# Patient Record
Sex: Female | Born: 1995 | Race: Black or African American | Hispanic: No | Marital: Single | Smoking: Current every day smoker
Health system: Southern US, Community
[De-identification: ages and names within clinical notes are randomized; demographics above are authoritative.]

## PROBLEM LIST (undated history)

## (undated) ENCOUNTER — Inpatient Hospital Stay (HOSPITAL_COMMUNITY): Payer: Self-pay

## (undated) ENCOUNTER — Ambulatory Visit (HOSPITAL_COMMUNITY): Admission: EM | Discharge status: Absent without leave | Payer: Medicaid Other

## (undated) DIAGNOSIS — O99345 Other mental disorders complicating the puerperium: Secondary | ICD-10-CM

## (undated) DIAGNOSIS — B999 Unspecified infectious disease: Secondary | ICD-10-CM

## (undated) DIAGNOSIS — D649 Anemia, unspecified: Secondary | ICD-10-CM

## (undated) DIAGNOSIS — J45909 Unspecified asthma, uncomplicated: Secondary | ICD-10-CM

## (undated) DIAGNOSIS — F329 Major depressive disorder, single episode, unspecified: Secondary | ICD-10-CM

## (undated) DIAGNOSIS — M549 Dorsalgia, unspecified: Secondary | ICD-10-CM

## (undated) DIAGNOSIS — K219 Gastro-esophageal reflux disease without esophagitis: Secondary | ICD-10-CM

## (undated) DIAGNOSIS — F32A Depression, unspecified: Secondary | ICD-10-CM

## (undated) DIAGNOSIS — F419 Anxiety disorder, unspecified: Secondary | ICD-10-CM

## (undated) DIAGNOSIS — F319 Bipolar disorder, unspecified: Secondary | ICD-10-CM

## (undated) DIAGNOSIS — G8929 Other chronic pain: Secondary | ICD-10-CM

## (undated) DIAGNOSIS — N12 Tubulo-interstitial nephritis, not specified as acute or chronic: Secondary | ICD-10-CM

## (undated) DIAGNOSIS — R519 Headache, unspecified: Secondary | ICD-10-CM

## (undated) DIAGNOSIS — F53 Postpartum depression: Secondary | ICD-10-CM

## (undated) DIAGNOSIS — Z5189 Encounter for other specified aftercare: Secondary | ICD-10-CM

## (undated) DIAGNOSIS — N390 Urinary tract infection, site not specified: Secondary | ICD-10-CM

## (undated) HISTORY — PX: WISDOM TOOTH EXTRACTION: SHX21

## (undated) HISTORY — PX: DILATION AND CURETTAGE OF UTERUS: SHX78

## (undated) HISTORY — DX: Headache, unspecified: R51.9

## (undated) HISTORY — PX: INDUCED ABORTION: SHX677

---
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
## 2013-01-02 ENCOUNTER — Emergency Department (INDEPENDENT_AMBULATORY_CARE_PROVIDER_SITE_OTHER)
Admission: EM | Admit: 2013-01-02 | Discharge: 2013-01-02 | Disposition: A | Discharge status: Home / Self Care | Payer: Self-pay | Source: Home / Self Care | Attending: Family Medicine | Admitting: Family Medicine

## 2013-01-02 ENCOUNTER — Encounter (HOSPITAL_COMMUNITY): Payer: Self-pay | Admitting: Emergency Medicine

## 2013-01-02 ENCOUNTER — Other Ambulatory Visit (HOSPITAL_COMMUNITY)
Admission: RE | Admit: 2013-01-02 | Discharge: 2013-01-02 | Disposition: A | Discharge status: Home / Self Care | Payer: Self-pay | Source: Ambulatory Visit | Attending: Family Medicine | Admitting: Family Medicine

## 2013-01-02 DIAGNOSIS — N390 Urinary tract infection, site not specified: Secondary | ICD-10-CM

## 2013-01-02 DIAGNOSIS — N760 Acute vaginitis: Secondary | ICD-10-CM

## 2013-01-02 DIAGNOSIS — Z202 Contact with and (suspected) exposure to infections with a predominantly sexual mode of transmission: Secondary | ICD-10-CM

## 2013-01-02 DIAGNOSIS — Z113 Encounter for screening for infections with a predominantly sexual mode of transmission: Secondary | ICD-10-CM | POA: Insufficient documentation

## 2013-01-02 DIAGNOSIS — N72 Inflammatory disease of cervix uteri: Secondary | ICD-10-CM

## 2013-01-02 LAB — POCT URINALYSIS DIP (DEVICE)
Bilirubin Urine: NEGATIVE
Glucose, UA: NEGATIVE mg/dL
Hgb urine dipstick: NEGATIVE
Ketones, ur: NEGATIVE mg/dL
Leukocytes, UA: NEGATIVE
Nitrite: NEGATIVE
Protein, ur: 30 mg/dL — AB
Specific Gravity, Urine: 1.02 (ref 1.005–1.030)
Urobilinogen, UA: 1 mg/dL (ref 0.0–1.0)
pH: 8.5 — ABNORMAL HIGH (ref 5.0–8.0)

## 2013-01-02 LAB — POCT PREGNANCY, URINE: Preg Test, Ur: NEGATIVE

## 2013-01-02 MED ORDER — LIDOCAINE HCL (PF) 1 % IJ SOLN
INTRAMUSCULAR | Status: AC
  Filled 2013-01-02: qty 5

## 2013-01-02 MED ORDER — AZITHROMYCIN 250 MG PO TABS
ORAL_TABLET | ORAL | Status: AC
  Filled 2013-01-02: qty 4

## 2013-01-02 MED ORDER — CEFTRIAXONE SODIUM 250 MG IJ SOLR
250.0000 mg | Freq: Once | INTRAMUSCULAR | Status: AC
  Administered 2013-01-02: 250 mg via INTRAMUSCULAR

## 2013-01-02 MED ORDER — METRONIDAZOLE 500 MG PO TABS: 500.0000 mg | ORAL_TABLET | Freq: Two times a day (BID) | ORAL | Status: DC

## 2013-01-02 MED ORDER — NORGESTIM-ETH ESTRAD TRIPHASIC 0.18/0.215/0.25 MG-25 MCG PO TABS: 1.0000 | ORAL_TABLET | Freq: Every day | ORAL | Status: DC

## 2013-01-02 MED ORDER — AZITHROMYCIN 250 MG PO TABS
1000.0000 mg | ORAL_TABLET | Freq: Once | ORAL | Status: AC
  Administered 2013-01-02: 1000 mg via ORAL

## 2013-01-02 MED ORDER — CEFTRIAXONE SODIUM 250 MG IJ SOLR
INTRAMUSCULAR | Status: AC
  Filled 2013-01-02: qty 250

## 2013-01-02 NOTE — ED Notes (Signed)
Pt c/o vaginal discharge x 2 weeks. Pt reports she had an abortion in march and before then had bacteria vaginosis. Took all of her prescribed medicine but still feels like she has the same symptoms. Has a yellowish white discharge and foul smell. No problems with urinating. Mild pelvic pain, but denies abdominal pain. No fever. Patient is alert and oriented.

## 2013-01-02 NOTE — ED Provider Notes (Signed)
History     CSN: [id number]  Arrival date & time 01/02/13  1033   First MD Initiated Contact with Patient 01/02/13 1134      Chief Complaint  Patient presents with  . Vaginal Discharge    (Consider location/radiation/quality/duration/timing/severity/associated sxs/prior treatment) HPI Comments: 17 year old female here complaining of vaginal discharge. Status she wants to be tested for STDs and will be treated empirically for STDs as she may have been exposed recently. She reports unprotected sex. She states that she had a D&C in March and has been having a yellow thick and foul smell vaginal discharge since. States she is coming out of her period currently and is sexually active and is not using condoms or using any other birth control method. Denies pelvic pain. No fever or chills. No dysuria or hematuria. She has had a history of Chlamydia in the past. Denies history of smoking. No history of DVT or clot disorders.   Past Medical History  Diagnosis Date  . Infection     UTI    Past Surgical History  Procedure Laterality Date  . No past surgeries    . Induced abortion      No family history on file.  History  Substance Use Topics  . Smoking status: Never Smoker   . Smokeless tobacco: Not on file  . Alcohol Use: No    OB History   Grav Para Term Preterm Abortions TAB SAB Ect Mult Living   2 1 1       1       Review of Systems  Constitutional: Negative for fever and chills.  Gastrointestinal: Negative for nausea, vomiting and abdominal pain.  Genitourinary: Positive for vaginal discharge. Negative for vaginal bleeding and pelvic pain.  Skin: Negative for rash.  Neurological: Negative for dizziness and headaches.  All other systems reviewed and are negative.    Allergies  Review of patient's allergies indicates no known allergies.  Home Medications   Current Outpatient Rx  Name  Route  Sig  Dispense  Refill  . metroNIDAZOLE (FLAGYL) 500 MG tablet   Oral  Take 1 tablet (500 mg total) by mouth 2 (two) times daily.   14 tablet   0   . Norgestimate-Ethinyl Estradiol Triphasic 0.18/0.215/0.25 MG-25 MCG tab   Oral   Take 1 tablet by mouth daily.   1 Package   2     LMP 12/25/2012  Breastfeeding? Unknown  Physical Exam  Nursing note and vitals reviewed. Constitutional: She is oriented to person, place, and time. She appears well-developed and well-nourished. No distress.  Eyes: No scleral icterus.  Cardiovascular: Normal heart sounds.   Pulmonary/Chest: Breath sounds normal.  Abdominal: Soft. She exhibits no distension and no mass. There is no tenderness. There is no rebound and no guarding.  Genitourinary: Uterus normal. There is no rash on the right labia. There is no rash on the left labia. Cervix exhibits discharge. Cervix exhibits no motion tenderness and no friability. Vaginal discharge found.  Lymphadenopathy:    She has no cervical adenopathy.       Right: No inguinal adenopathy present.       Left: No inguinal adenopathy present.  Neurological: She is alert and oriented to person, place, and time.  Skin: No rash noted. She is not diaphoretic.    ED Course  Procedures (including critical care time)  Labs Reviewed  POCT URINALYSIS DIP (DEVICE) - Abnormal; Notable for the following:    pH 8.5 (*)  Protein, ur 30 (*)    All other components within normal limits  POCT PREGNANCY, URINE  CERVICOVAGINAL ANCILLARY ONLY   No results found.   1. Possible exposure to STD   2. Vaginitis       MDM  Treated empirically with Rocephin 250 mg IM x1 and a azithromycin 1 g oral x1. Prescribed metronidazole and also provided a prescription for low estrogen oral contraceptive pills.  Sharin Grave, MD 01/05/13 1035

## 2013-01-05 NOTE — ED Notes (Signed)
GC/Chlamydia neg., Affirm: Candida and Trich neg., Gardnerella pos. Pt. adequately treated with Flagyl. Vassie Moselle 01/05/2013

## 2013-05-11 ENCOUNTER — Emergency Department (INDEPENDENT_AMBULATORY_CARE_PROVIDER_SITE_OTHER)
Admission: EM | Admit: 2013-05-11 | Discharge: 2013-05-11 | Disposition: A | Discharge status: Home / Self Care | Payer: Self-pay | Source: Home / Self Care | Attending: Family Medicine | Admitting: Family Medicine

## 2013-05-11 ENCOUNTER — Other Ambulatory Visit (HOSPITAL_COMMUNITY)
Admission: RE | Admit: 2013-05-11 | Discharge: 2013-05-11 | Disposition: A | Discharge status: Home / Self Care | Payer: Medicaid Other | Source: Ambulatory Visit | Attending: Family Medicine | Admitting: Family Medicine

## 2013-05-11 ENCOUNTER — Encounter (HOSPITAL_COMMUNITY): Payer: Self-pay | Admitting: Emergency Medicine

## 2013-05-11 DIAGNOSIS — N760 Acute vaginitis: Secondary | ICD-10-CM | POA: Insufficient documentation

## 2013-05-11 DIAGNOSIS — Z113 Encounter for screening for infections with a predominantly sexual mode of transmission: Secondary | ICD-10-CM | POA: Insufficient documentation

## 2013-05-11 DIAGNOSIS — N921 Excessive and frequent menstruation with irregular cycle: Secondary | ICD-10-CM

## 2013-05-11 LAB — POCT PREGNANCY, URINE: Preg Test, Ur: NEGATIVE

## 2013-05-11 LAB — POCT URINALYSIS DIP (DEVICE)
Glucose, UA: 100 mg/dL — AB
Ketones, ur: NEGATIVE mg/dL
Leukocytes, UA: NEGATIVE
Nitrite: NEGATIVE
Protein, ur: 100 mg/dL — AB
Specific Gravity, Urine: >=1.03 (ref 1.005–1.030)
Urobilinogen, UA: 1 mg/dL (ref 0.0–1.0)
pH: 6.5 (ref 5.0–8.0)

## 2013-05-11 NOTE — ED Provider Notes (Signed)
Melanie Hancock is a 17 y.o. female who presents to Urgent Care today for abdominal pain and vaginal bleeding. She noted abdominal pressure consistent with UTI last week. She took left over antibiotics for about one week and had resolution of her symptoms. She no longer has any dysuria or urinary urgency or frequency. She however note some mild pelvic discomfort associated with vaginal bleeding started recently. It was initially spotting and now is more heavy bleeding. Her last period was to have weeks ago. She typically does not have midcycle bleeding. She feels well otherwise with no nausea vomiting or diarrhea. She has not tried any medications for this. She does not take birth control.   Past Medical History  Diagnosis Date  . Infection     UTI   History  Substance Use Topics  . Smoking status: Never Smoker   . Smokeless tobacco: Not on file  . Alcohol Use: No   ROS as above Medications reviewed. No current facility-administered medications for this encounter.   No current outpatient prescriptions on file.    Exam:  BP 110/72  Pulse 68  Temp(Src) 98 F (36.7 C) (Oral)  Resp 16  SpO2 100%  LMP 05/04/2013 Gen: Well NAD HEENT: EOMI,  MMM Lungs: CTABL Nl WOB Heart: RRR no MRG Abd: NABS, NT, ND Exts: Non edematous BL  LE, warm and well perfused.  GYN: Normal external genitalia. Vaginal canal with menstrual blood. Cervix difficult to see due to menstrual bleeding. Normal otherwise.  Results for orders placed during the hospital encounter of 05/11/13 (from the past 24 hour(s))  POCT URINALYSIS DIP (DEVICE)     Status: Abnormal   Collection Time    05/11/13  1:04 PM      Result Value Range   Glucose, UA 100 (*) NEGATIVE mg/dL   Bilirubin Urine SMALL (*) NEGATIVE   Ketones, ur NEGATIVE  NEGATIVE mg/dL   Specific Gravity, Urine >=1.030  1.005 - 1.030   Hgb urine dipstick LARGE (*) NEGATIVE   pH 6.5  5.0 - 8.0   Protein, ur 100 (*) NEGATIVE mg/dL   Urobilinogen, UA 1.0  0.0  - 1.0 mg/dL   Nitrite NEGATIVE  NEGATIVE   Leukocytes, UA NEGATIVE  NEGATIVE  POCT PREGNANCY, URINE     Status: None   Collection Time    05/11/13  1:15 PM      Result Value Range   Preg Test, Ur NEGATIVE  NEGATIVE   No results found.  Assessment and Plan: 17 y.o. female with vaginal bleeding.  Abdominal pain was likely due to the vaginal bleeding. Urine culture, vaginal secretions cytology for gonorrhea Chlamydia Trichomonas BV and yeast are pending Followup with OB/GYN if this persists Discussed warning signs or symptoms. Please see discharge instructions. Patient expresses understanding.      Rodolph Bong, MD 05/11/13 1324

## 2013-05-11 NOTE — ED Notes (Signed)
Patient can't void at this time 

## 2013-05-11 NOTE — ED Notes (Signed)
C/o abd pain and bleeding

## 2013-05-13 ENCOUNTER — Telehealth (HOSPITAL_COMMUNITY): Payer: Self-pay | Admitting: Family Medicine

## 2013-05-13 MED ORDER — METRONIDAZOLE 500 MG PO TABS: 500.0000 mg | ORAL_TABLET | Freq: Two times a day (BID) | ORAL | Status: DC

## 2013-05-13 NOTE — ED Notes (Signed)
Melanie Hancock will call patient.  Flagyl rx sent in.    Rodolph Bong, MD 05/13/13 [phone]

## 2013-05-13 NOTE — ED Notes (Signed)
Lab review

## 2013-05-17 ENCOUNTER — Telehealth (HOSPITAL_COMMUNITY): Payer: Self-pay | Admitting: *Deleted

## 2013-05-17 NOTE — ED Notes (Signed)
Gardnerella pos. Rest of labs neg.  Pt. adequately treated with Flagyl. Vassie Moselle 05/17/2013

## 2013-05-17 NOTE — ED Notes (Signed)
GC/Chlamydia neg., Affirm: Candida and Trich neg., Gardnerella pos.  Dr. Denyse Amass e-prescribed Flagyl to the [facility]. I called pt. Pt. verified x 2 and given results. Pt. told she where to pick up her Rx. Pt. asked how much it costs. I told her I did not know, she would have to call the pharmacy. Pt. voiced understanding. Vassie Moselle 05/17/2013

## 2013-05-26 ENCOUNTER — Emergency Department (HOSPITAL_COMMUNITY): Payer: Medicaid Other

## 2013-05-26 ENCOUNTER — Observation Stay (HOSPITAL_COMMUNITY)
Admission: EM | Admit: 2013-05-26 | Discharge: 2013-05-27 | Disposition: A | Discharge status: Home / Self Care | Payer: Medicaid Other | Attending: Internal Medicine | Admitting: Internal Medicine

## 2013-05-26 ENCOUNTER — Encounter (HOSPITAL_COMMUNITY): Payer: Self-pay | Admitting: *Deleted

## 2013-05-26 ENCOUNTER — Emergency Department (HOSPITAL_COMMUNITY)
Admission: EM | Admit: 2013-05-26 | Discharge: 2013-05-26 | Disposition: A | Discharge status: Home / Self Care | Payer: Medicaid Other | Attending: Emergency Medicine | Admitting: Emergency Medicine

## 2013-05-26 DIAGNOSIS — D649 Anemia, unspecified: Secondary | ICD-10-CM

## 2013-05-26 DIAGNOSIS — Z792 Long term (current) use of antibiotics: Secondary | ICD-10-CM | POA: Insufficient documentation

## 2013-05-26 DIAGNOSIS — D509 Iron deficiency anemia, unspecified: Principal | ICD-10-CM | POA: Insufficient documentation

## 2013-05-26 DIAGNOSIS — N390 Urinary tract infection, site not specified: Secondary | ICD-10-CM

## 2013-05-26 DIAGNOSIS — Z3202 Encounter for pregnancy test, result negative: Secondary | ICD-10-CM | POA: Insufficient documentation

## 2013-05-26 DIAGNOSIS — N10 Acute tubulo-interstitial nephritis: Secondary | ICD-10-CM

## 2013-05-26 DIAGNOSIS — N924 Excessive bleeding in the premenopausal period: Secondary | ICD-10-CM | POA: Insufficient documentation

## 2013-05-26 DIAGNOSIS — I498 Other specified cardiac arrhythmias: Secondary | ICD-10-CM | POA: Insufficient documentation

## 2013-05-26 HISTORY — DX: Urinary tract infection, site not specified: N39.0

## 2013-05-26 LAB — URINALYSIS, ROUTINE W REFLEX MICROSCOPIC
Glucose, UA: NEGATIVE mg/dL
Ketones, ur: 15 mg/dL — AB
Nitrite: NEGATIVE
Protein, ur: >300 mg/dL — AB
Specific Gravity, Urine: 1.036 — ABNORMAL HIGH (ref 1.005–1.030)
Urobilinogen, UA: 1 mg/dL (ref 0.0–1.0)
pH: 6 (ref 5.0–8.0)

## 2013-05-26 LAB — COMPREHENSIVE METABOLIC PANEL
ALT: 12 U/L (ref 0–35)
AST: 24 U/L (ref 0–37)
Albumin: 3.2 g/dL — ABNORMAL LOW (ref 3.5–5.2)
Alkaline Phosphatase: 55 U/L (ref 39–117)
BUN: 11 mg/dL (ref 6–23)
CO2: 24 mEq/L (ref 19–32)
Calcium: 8.2 mg/dL — ABNORMAL LOW (ref 8.4–10.5)
Chloride: 107 mEq/L (ref 96–112)
Creatinine, Ser: 0.92 mg/dL (ref 0.50–1.10)
GFR calc Af Amer: >90 mL/min (ref >90)
GFR calc non Af Amer: 90 mL/min — ABNORMAL LOW (ref >90)
Glucose, Bld: 99 mg/dL (ref 70–99)
Potassium: 3.4 mEq/L — ABNORMAL LOW (ref 3.5–5.1)
Sodium: 139 mEq/L (ref 135–145)
Total Bilirubin: 0.3 mg/dL (ref 0.3–1.2)
Total Protein: 6.3 g/dL (ref 6.0–8.3)

## 2013-05-26 LAB — CBC
HCT: 22.2 % — ABNORMAL LOW (ref 36.0–46.0)
HCT: 30 % — ABNORMAL LOW (ref 36.0–46.0)
Hemoglobin: 6.6 g/dL — CL (ref 12.0–15.0)
Hemoglobin: 9.3 g/dL — ABNORMAL LOW (ref 12.0–15.0)
MCH: 19.4 pg — ABNORMAL LOW (ref 26.0–34.0)
MCH: 21.6 pg — ABNORMAL LOW (ref 26.0–34.0)
MCHC: 29.7 g/dL — ABNORMAL LOW (ref 30.0–36.0)
MCHC: 31 g/dL (ref 30.0–36.0)
MCV: 65.3 fL — ABNORMAL LOW (ref 78.0–100.0)
MCV: 69.8 fL — ABNORMAL LOW (ref 78.0–100.0)
Platelets: 301 10*3/uL (ref 150–400)
Platelets: 279 10*3/uL (ref 150–400)
RBC: 3.4 MIL/uL — ABNORMAL LOW (ref 3.87–5.11)
RBC: 4.3 MIL/uL (ref 3.87–5.11)
RDW: 17.2 % — ABNORMAL HIGH (ref 11.5–15.5)
RDW: 20 % — ABNORMAL HIGH (ref 11.5–15.5)
WBC: 5.6 10*3/uL (ref 4.0–10.5)
WBC: 6.2 10*3/uL (ref 4.0–10.5)

## 2013-05-26 LAB — MAGNESIUM: Magnesium: 1.8 mg/dL (ref 1.5–2.5)

## 2013-05-26 LAB — POCT PREGNANCY, URINE: Preg Test, Ur: NEGATIVE

## 2013-05-26 LAB — CBC WITH DIFFERENTIAL/PLATELET
Basophils Absolute: 0 10*3/uL (ref 0.0–0.1)
Basophils Relative: 1 % (ref 0–1)
Eosinophils Absolute: 0.3 10*3/uL (ref 0.0–0.7)
Eosinophils Relative: 6 % — ABNORMAL HIGH (ref 0–5)
HCT: 21.9 % — ABNORMAL LOW (ref 36.0–46.0)
Hemoglobin: 6.5 g/dL — CL (ref 12.0–15.0)
Lymphocytes Relative: 38 % (ref 12–46)
Lymphs Abs: 2 10*3/uL (ref 0.7–4.0)
MCH: 19.3 pg — ABNORMAL LOW (ref 26.0–34.0)
MCHC: 29.7 g/dL — ABNORMAL LOW (ref 30.0–36.0)
MCV: 65.2 fL — ABNORMAL LOW (ref 78.0–100.0)
Monocytes Absolute: 0.4 10*3/uL (ref 0.1–1.0)
Monocytes Relative: 8 % (ref 3–12)
Neutro Abs: 2.6 10*3/uL (ref 1.7–7.7)
Neutrophils Relative %: 48 % (ref 43–77)
Platelets: 282 10*3/uL (ref 150–400)
RBC: 3.36 MIL/uL — ABNORMAL LOW (ref 3.87–5.11)
RDW: 17.2 % — ABNORMAL HIGH (ref 11.5–15.5)
WBC: 5.4 10*3/uL (ref 4.0–10.5)

## 2013-05-26 LAB — URINE MICROSCOPIC-ADD ON

## 2013-05-26 LAB — PROTIME-INR
INR: 1.09 (ref 0.00–1.49)
Prothrombin Time: 13.9 seconds (ref 11.6–15.2)

## 2013-05-26 LAB — IRON AND TIBC
Iron: 19 ug/dL — ABNORMAL LOW (ref 42–135)
Saturation Ratios: 5 % — ABNORMAL LOW (ref 20–55)
TIBC: 386 ug/dL (ref 250–470)
UIBC: 367 ug/dL (ref 125–400)

## 2013-05-26 LAB — LIPASE, BLOOD: Lipase: 19 U/L (ref 11–59)

## 2013-05-26 LAB — VITAMIN B12: Vitamin B-12: 540 pg/mL (ref 211–911)

## 2013-05-26 LAB — FERRITIN: Ferritin: 3 ng/mL — ABNORMAL LOW (ref 10–291)

## 2013-05-26 LAB — RPR: RPR Ser Ql: NONREACTIVE

## 2013-05-26 LAB — ABO/RH: ABO/RH(D): A POS

## 2013-05-26 LAB — RETICULOCYTES
RBC.: 3.71 MIL/uL — ABNORMAL LOW (ref 3.87–5.11)
Retic Count, Absolute: 37.1 10*3/uL (ref 19.0–186.0)
Retic Ct Pct: 1 % (ref 0.4–3.1)

## 2013-05-26 LAB — FOLATE: Folate: 9.7 ng/mL

## 2013-05-26 LAB — PREPARE RBC (CROSSMATCH)

## 2013-05-26 LAB — OCCULT BLOOD, POC DEVICE: Fecal Occult Bld: NEGATIVE

## 2013-05-26 LAB — HIV ANTIBODY (ROUTINE TESTING W REFLEX): HIV: NONREACTIVE

## 2013-05-26 LAB — TROPONIN I: Troponin I: <0.3 ng/mL (ref <0.30)

## 2013-05-26 MED ORDER — CEPHALEXIN 500 MG PO CAPS: 500.0000 mg | ORAL_CAPSULE | Freq: Once | ORAL | Status: DC

## 2013-05-26 MED ORDER — CEPHALEXIN 250 MG PO CAPS
500.0000 mg | ORAL_CAPSULE | Freq: Once | ORAL | Status: AC
  Administered 2013-05-26: 500 mg via ORAL
  Filled 2013-05-26: qty 2

## 2013-05-26 MED ORDER — FERROUS SULFATE 325 (65 FE) MG PO TABS
325.0000 mg | ORAL_TABLET | Freq: Three times a day (TID) | ORAL | Status: DC
  Administered 2013-05-26 – 2013-05-27 (×3): 325 mg via ORAL
  Filled 2013-05-26 (×5): qty 1

## 2013-05-26 MED ORDER — ONDANSETRON HCL 4 MG/2ML IJ SOLN
4.0000 mg | Freq: Once | INTRAMUSCULAR | Status: AC
  Administered 2013-05-26: 4 mg via INTRAVENOUS
  Filled 2013-05-26: qty 2

## 2013-05-26 MED ORDER — SODIUM CHLORIDE 0.9 % IV SOLN
INTRAVENOUS | Status: DC
  Administered 2013-05-26: 13:00:00 via INTRAVENOUS

## 2013-05-26 MED ORDER — ONDANSETRON HCL 4 MG PO TABS: 4.0000 mg | ORAL_TABLET | Freq: Four times a day (QID) | ORAL | Status: DC | PRN

## 2013-05-26 MED ORDER — DEXTROSE 5 % IV SOLN
1.0000 g | INTRAVENOUS | Status: DC
  Administered 2013-05-26: 1 g via INTRAVENOUS
  Filled 2013-05-26 (×2): qty 10

## 2013-05-26 MED ORDER — PHENAZOPYRIDINE HCL 200 MG PO TABS: 200.0000 mg | ORAL_TABLET | Freq: Once | ORAL | Status: DC

## 2013-05-26 MED ORDER — POTASSIUM CHLORIDE CRYS ER 20 MEQ PO TBCR
20.0000 meq | EXTENDED_RELEASE_TABLET | Freq: Once | ORAL | Status: AC
  Administered 2013-05-26: 20 meq via ORAL
  Filled 2013-05-26: qty 1

## 2013-05-26 MED ORDER — ACETAMINOPHEN 325 MG PO TABS: 650.0000 mg | ORAL_TABLET | Freq: Four times a day (QID) | ORAL | Status: DC | PRN

## 2013-05-26 MED ORDER — ACETAMINOPHEN 650 MG RE SUPP: 650.0000 mg | Freq: Four times a day (QID) | RECTAL | Status: DC | PRN

## 2013-05-26 MED ORDER — PHENAZOPYRIDINE HCL 100 MG PO TABS
200.0000 mg | ORAL_TABLET | Freq: Once | ORAL | Status: AC
  Administered 2013-05-26: 200 mg via ORAL
  Filled 2013-05-26: qty 2

## 2013-05-26 MED ORDER — SODIUM CHLORIDE 0.9 % IV BOLUS (SEPSIS)
1000.0000 mL | Freq: Once | INTRAVENOUS | Status: AC
  Administered 2013-05-26: 1000 mL via INTRAVENOUS

## 2013-05-26 NOTE — ED Notes (Signed)
Pt. reports that she was just here and has taken the antibiotic and pyridium today. Pt. reports feeling "nauseated after going to pick up her friend and then she tried to eat some bacon."  Pt. Reports not feeling any worse after eating bacon.

## 2013-05-26 NOTE — H&P (Signed)
I have seen the patient and reviewed the HPI by medical student, Geronimo Boot and discussed the care of the patient with him. Please see my note for documentation of my findings, assessment, and plans  Lorretta Harp, MD PGY3, Internal Medicine Teaching Service Pager: [phone]

## 2013-05-26 NOTE — H&P (Signed)
Hospital Admission Note Date: 05/26/2013  Patient name: Melanie Hancock Medical record number: [id number] Date of birth: 07/31/1996 Age: 17 y.o. Gender: female PCP: No PCP Per Patient  Medical Service: IMTS  Attending physician:  Dr. Kem Kays     Internal Medicine Teaching Service Contact Information  Weekday Hours (7AM-5PM):  1st Contact: Geronimo Boot, Pager:[phone] 2nd Contact: Dr. Virgina Organ Pager:[phone] ** If no return call within 15 minutes (after trying both pagers listed above), please call after hours pagers.  After 5 pm or weekends: 1st Contact: Pager: [phone] 2nd Contact: Pager: [phone]    Chief Complaint: Flank pain and increased urinary frequency and SOB.   History of Present Illness:  Patient is a 17 yo AAF lady with PMH of heavy menstrual bleeding, who presented with SOB, flank pain and increased urinary frequency.   Patient was seen in Ed early today. She was diagnosed with UTI and was discharged home on Keflex and pyridium. Patient reports that she developed bilateral flank and suprapubic pain several hours ago. Patient reports her pain is sudden onset. The pain is sharp and aching and severe. No alleviating/aggravating factors. It was associated with nausea. Patient denies fever, vomiting, diarrhea.   She also reports having SOB, sternal "heaviness" without radiation. She does not have cough or sputum production. She has some lightheadedness upon standing, but denies dizziness. No recent travel and pain over her calf areas. Patient was found to have Hgb of 6.6 in ED. Of note, she reports having had heavy menstrual periods since menarche. Her periods are normally very regular and she denies metrorrhagia. Last menstrual period was 2 weeks ago. She does not have active vaginal bleeding now. She is not taking any iron supplement. She dose not have family hx of blood disorder. She is not taking OCPs and does not have IUD/implantable contraceptive.  She has not seen Ob/Gyn since  the birth of her child in 2011. pregnancy test is negative in ED.   ROS:  Denies fever, headaches,  cough, diarrhea, constipation, rashes,  joint pain or leg swelling.   Meds: Current Outpatient Rx  Name  Route  Sig  Dispense  Refill  . cephALEXin (KEFLEX) 500 MG capsule   Oral   Take 1 capsule (500 mg total) by mouth once.   28 capsule   0   . phenazopyridine (PYRIDIUM) 200 MG tablet   Oral   Take 1 tablet (200 mg total) by mouth once.   6 tablet   0   . metroNIDAZOLE (FLAGYL) 500 MG tablet   Oral   Take 1 tablet (500 mg total) by mouth 2 (two) times daily.   14 tablet   0     Allergies: Allergies as of 05/26/2013  . (No Known Allergies)   Past Medical History  Diagnosis Date  . Infection     UTI  . UTI (lower urinary tract infection)    Past Surgical History  Procedure Laterality Date  . No past surgeries    . Induced abortion     No family history on file. History   Social History  . Marital Status: Single    Spouse Name: N/A    Number of Children: N/A  . Years of Education: N/A   Occupational History  . Not on file.   Social History Main Topics  . Smoking status: Never Smoker   . Smokeless tobacco: Never Used  . Alcohol Use: No  . Drug Use: No  . Sexual Activity: Yes    Birth  Control/ Protection: None   Other Topics Concern  . Not on file   Social History Narrative  . No narrative on file    Review of Systems: Full 14-point review of systems otherwise negative except as noted above in HPI.  Physical Exam:   Filed Vitals:   05/26/13 0810 05/26/13 1049  BP: 90/49 99/61  Pulse: 73 58  Temp: 98.7 F (37.1 C)   TempSrc: Oral   Resp: 12 18  SpO2: 99% 100%    Physical Exam:   Filed Vitals:   05/26/13 1700 05/26/13 1707 05/26/13 1710 05/26/13 1736  BP: 99/61 104/75 109/51 100/62  Pulse: 71 56 64 73  Temp: 98.3 F (36.8 C)   98.3 F (36.8 C)  TempSrc: Oral   Oral  Resp: 18 18 18 16   Height:      Weight:      SpO2: 99% 100%  100%     General: Not in acute distress. Patient is pale HEENT: PERRL, EOMI, no scleral icterus, No JVD or bruit Cardiac: S1/S2, RRR, No murmurs, gallops or rubs Lungs:  Clear to ausculation bilaterally, no wheezes, ronchi, or crackles;  Abd:  +normal bowel sounds, soft, non-distended, no organomegaly, mild suprapubic and RLQ tenderness without guarding or rebound tenderness positive CVA tenderness on left side. Ext: No edema. 2+DP/PT pulse bilaterally Musculoskeletal: No joint deformities, erythema, or stiffness, ROM full Skin: No rashes.  Neuro: Alert and oriented X3, cranial nerves II-XII grossly intact, muscle strength 5/5 in all extremeties, sensation to light touch intact.  Psych: Patient is not psychotic, no suicidal or hemocidal ideation.  Lab results: Basic Metabolic Panel:  Recent Labs  16/10/96 0940  NA 139  K 3.4*  CL 107  CO2 24  GLUCOSE 99  BUN 11  CREATININE 0.92  CALCIUM 8.2*   Liver Function Tests:  Recent Labs  05/26/13 0940  AST 24  ALT 12  ALKPHOS 55  BILITOT 0.3  PROT 6.3  ALBUMIN 3.2*    Recent Labs  05/26/13 0940  LIPASE 19   No results found for this basename: AMMONIA,  in the last 72 hours CBC:  Recent Labs  05/26/13 0940 05/26/13 1040  WBC 5.4 5.6  NEUTROABS 2.6  --   HGB 6.5* 6.6*  HCT 21.9* 22.2*  MCV 65.2* 65.3*  PLT 282 301   Cardiac Enzymes: No results found for this basename: CKTOTAL, CKMB, CKMBINDEX, TROPONINI,  in the last 72 hours BNP: No results found for this basename: PROBNP,  in the last 72 hours D-Dimer: No results found for this basename: DDIMER,  in the last 72 hours CBG: No results found for this basename: GLUCAP,  in the last 72 hours Hemoglobin A1C: No results found for this basename: HGBA1C,  in the last 72 hours Fasting Lipid Panel: No results found for this basename: CHOL, HDL, LDLCALC, TRIG, CHOLHDL, LDLDIRECT,  in the last 72 hours Thyroid Function Tests: No results found for this basename:  TSH, T4TOTAL, FREET4, T3FREE, THYROIDAB,  in the last 72 hours Anemia Panel: No results found for this basename: VITAMINB12, FOLATE, FERRITIN, TIBC, IRON, RETICCTPCT,  in the last 72 hours Coagulation: No results found for this basename: LABPROT, INR,  in the last 72 hours Urine Drug Screen: Drugs of Abuse  No results found for this basename: labopia,  cocainscrnur,  labbenz,  amphetmu,  thcu,  labbarb    Alcohol Level: No results found for this basename: ETH,  in the last 72 hours Urinalysis:  Recent Labs  05/26/13 0444  COLORURINE RED*  LABSPEC 1.036*  PHURINE 6.0  GLUCOSEU NEGATIVE  HGBUR LARGE*  BILIRUBINUR SMALL*  KETONESUR 15*  PROTEINUR >300*  UROBILINOGEN 1.0  NITRITE NEGATIVE  LEUKOCYTESUR LARGE*   Misc. Labs:  Imaging results:  Ct Abdomen Pelvis Wo Contrast  05/26/2013   CLINICAL DATA:  Nausea.  EXAM: CT ABDOMEN AND PELVIS WITHOUT CONTRAST  TECHNIQUE: Multidetector CT imaging of the abdomen and pelvis was performed following the standard protocol without intravenous contrast.  COMPARISON:  None  FINDINGS: Limited visualization of the lung bases demonstrated a 4 mm nodule within the left lower lobe (image 5; series 3). Normal heart size. Low attenuation of the blood pool is coronary seen with anemia.  Lack of intravenous contrast material limits evaluation of the solid organ parenchyma. The liver, spleen, pancreas and bilateral adrenal glands are unremarkable. Kidneys are symmetric in size. No hydronephrosis. No nephroureterolithiasis. Urinary bladder is grossly unremarkable. Uterus and bilateral adnexal structures are grossly  Normal caliber abdominal aorta. No retroperitoneal or pelvic lymphadenopathy.  No abnormal bowel wall thickening. No evidence for bowel obstruction. No free fluid or free intraperitoneal air. Normal appendix.  No aggressive or acute appearing osseous lesions.  IMPRESSION: 1. No nephroureterolithiasis. No hydronephrosis. 2. Normal appendix    Electronically Signed   By: Annia Belt M.D.   On: 05/26/2013 09:16    Other results:  EKG: Sinus rhythm, regular, RAD, early R wave progression, normal QT interval, No ischemic change in T waves or ST segments.  1. Symptomatic Anemia - Hgb of 6.3; microcytic anemia with MCV 65.3. It is mostly likely secondary to long-standing history of menorrhagia since menarche. Other different diagnoses include, but less likely, GI bleeding (FOBT negative), hereditary blood disorder, such as thalassemia (patient denies any family history of blood disorder). Patient is symptomatic in ED, including chest heaviness and shortness of breath. blodd type and cross were done in ED. Patient is going to be transfused with 2 units of blood. EKG has no ischemic change. Trop x 1 negative.   - Admit to Med-surg - transfuse with 2 units of blood.  - INR - anemia panel (will get an anemia panel before transfusion) - repeat Hbg after transfusion tonight and tomorrow AM - IVF: NS 100cc/h - Ferrous sulfate 325 mg tid.  - reg diet  2. Pyelonephritis -urinalysis was positive for UTI. Though patient dose not have fever and leukocytosis, she has left CVA tenderness, making the pyelonephritis likely diagnosis. CT abdomen did not show kidney stone.   - IV ceftriaxone 1g daily - will get STI panel, including GC, HZV and HIV screen - f/u urine cultures and sensitivities  3. Bradycardia - Patient noted to be bradycardic to 50's, could be patient's normal baseline but uncertain; however, this seems unusual in the setting of mild hypotension and severe anemia, which is somewhat puzzling and warrants concern for cardiac etiology. - Will continue to monitor and f/u EKG  #  F/E/N  -NS: IV 100 cc/hr,  D51/2 NS 100 cc/hr  -K=3.4: 20 mEq of KCl, po -Diet: regular  # DVT px: SCD  Dispo: Disposition is deferred at this time, awaiting improvement of current medical problems. Anticipated discharge in approximately 1 to 2 day(s).    The patient does not have a current PCP (No PCP Per Patient), therefore will not be requiring OPC follow-up after discharge.   The patient does not have transportation limitations that hinder transportation to clinic appointments.  Signed:  Lorretta Harp, MD PGY2,  Internal Medicine Teaching Service Pager: [phone]  05/26/2013, 12:21 PM

## 2013-05-26 NOTE — Progress Notes (Signed)
NURSING PROGRESS NOTE  Melanie Hancock [id number] Admission Data: 05/26/2013 3:54 PM Attending Provider: Jonah Blue, DO PCP:No PCP Per Patient Code Status: full   Melanie Hancock is a 17 y.o. female patient admitted from ED:  -No acute distress noted.  -No complaints of shortness of breath.  -No complaints of chest pain.     Blood pressure 98/48, pulse 81, temperature 98.6 F (37 C), temperature source Oral, resp. rate 18, last menstrual period 04/20/2013, SpO2 100.00%.   IV Fluids:  IV in place, occlusive dsg intact without redness, IV cath antecubital left, condition patent and no redness none.   Allergies:  Review of patient's allergies indicates no known allergies.  Past Medical History:   has a past medical history of Infection and UTI (lower urinary tract infection).  Past Surgical History:   has past surgical history that includes No past surgeries and Induced abortion.  Social History:   reports that she has never smoked. She has never used smokeless tobacco. She reports that she does not drink alcohol or use illicit drugs.  Skin: intact  Patient/Family orientated to room. Information packet given to patient/family. Admission inpatient armband information verified with patient/family to include name and date of birth and placed on patient arm. Side rails up x 2, fall assessment and education completed with patient/family. Patient/family able to verbalize understanding of risk associated with falls and verbalized understanding to call for assistance before getting out of bed. Call light within reach. Patient/family able to voice and demonstrate understanding of unit orientation instructions.    Will continue to evaluate and treat per MD orders.   Madelin Rear, MSN, RN, [employer]

## 2013-05-26 NOTE — ED Provider Notes (Signed)
CSN: [id number]     Arrival date & time 05/26/13  0416 History   First MD Initiated Contact with Patient 05/26/13 [phone]     Chief Complaint  Patient presents with  . Abdominal Pain   HPI Is reports developing lower abdominal pain over the past 24 hours.  She describes as a pressure and abnormal feeling in her lower abdomen.  She has urinary frequency with some dysuria.  She feels a sense of "pressure" when she attempts to urinate.  She denies vaginal discharge.  She denies pain with intercourse.  She denies flank pain.  She was nauseated earlier but denies vomiting.  Fevers or chills at home.  No chest pain shortness of breath.  Her symptoms are mild to moderate in severity.  Nothing worsens or improves her symptoms   Past Medical History  Diagnosis Date  . Infection     UTI   Past Surgical History  Procedure Laterality Date  . No past surgeries    . Induced abortion     History reviewed. No pertinent family history. History  Substance Use Topics  . Smoking status: Never Smoker   . Smokeless tobacco: Not on file  . Alcohol Use: No   OB History   Grav Para Term Preterm Abortions TAB SAB Ect Mult Living   2 1 1       1      Review of Systems  All other systems reviewed and are negative.    Allergies  Review of patient's allergies indicates no known allergies.  Home Medications   Current Outpatient Rx  Name  Route  Sig  Dispense  Refill  . cephALEXin (KEFLEX) 500 MG capsule   Oral   Take 1 capsule (500 mg total) by mouth once.   28 capsule   0   . metroNIDAZOLE (FLAGYL) 500 MG tablet   Oral   Take 1 tablet (500 mg total) by mouth 2 (two) times daily.   14 tablet   0   . phenazopyridine (PYRIDIUM) 200 MG tablet   Oral   Take 1 tablet (200 mg total) by mouth once.   6 tablet   0    BP 111/68  Pulse 64  Temp(Src) 97.8 F (36.6 C) (Oral)  Resp 16  SpO2 100%  LMP 04/20/2013 Physical Exam  Nursing note and vitals reviewed. Constitutional: She is oriented  to person, place, and time. She appears well-developed and well-nourished. No distress.  HENT:  Head: Normocephalic and atraumatic.  Eyes: EOM are normal.  Neck: Normal range of motion.  Cardiovascular: Normal rate, regular rhythm and normal heart sounds.   Pulmonary/Chest: Effort normal and breath sounds normal.  Abdominal: Soft. She exhibits no distension.  Mild suprapubic tenderness.  No guarding or rebound  Musculoskeletal: Normal range of motion.  Neurological: She is alert and oriented to person, place, and time.  Skin: Skin is warm and dry.  Psychiatric: She has a normal mood and affect. Judgment normal.    ED Course  Procedures (including critical care time) Labs Review Labs Reviewed  URINALYSIS, ROUTINE W REFLEX MICROSCOPIC - Abnormal; Notable for the following:    Color, Urine RED (*)    APPearance TURBID (*)    Specific Gravity, Urine 1.036 (*)    Hgb urine dipstick LARGE (*)    Bilirubin Urine SMALL (*)    Ketones, ur 15 (*)    Protein, ur >300 (*)    Leukocytes, UA LARGE (*)    All other components within  normal limits  URINE MICROSCOPIC-ADD ON - Abnormal; Notable for the following:    Squamous Epithelial / LPF FEW (*)    Bacteria, UA MANY (*)    All other components within normal limits  URINE CULTURE  POCT PREGNANCY, URINE   Imaging Review No results found.  MDM   1. Urinary tract infection    Symptoms seem consistent with urinary tract infection with possible hemorrhagic cystitis component.  Doubt ureteral stone.  Abdominal exam without significant tenderness.  Vital signs normal.  Home with Pyridium and Keflex.  She understands to return to the ER for new or worsening symptoms    Lyanne Co, MD 05/26/13 [phone]

## 2013-05-26 NOTE — H&P (Signed)
Date: 05/26/2013               Patient Name:  Melanie Hancock MRN: [id number]  DOB: [birth year] Age / Sex: 17 y.o., female   PCP: No Pcp Per Patient              Medical Service: Internal Medicine Teaching Service              Attending Physician: Dr. Thomos Lemons    First Contact: Geronimo Boot, MS 4 Pager: [phone]  Second Contact: Dr. Lorretta Harp Pager: [phone]  Third Contact Dr. Huel Coventry Pager: [phone]       After Hours (After 5p/  First Contact Pager: [phone]  weekends / holidays): Second Contact Pager: [phone]   Chief Complaint: Chest/rib pain, trouble breathing  History of Present Illness:  Patient is a 17yo AAF with a history of recurrent UTI's and heavy menstrual bleeding who presented to the ED early this morning for UTI symptoms.  Patient was diagnosed with UTI and was sent home after evaluation with instructions to return if symptoms worsened.  After she picked up her medicines she reports developing new, sudden onset of rib/flank pain, difficulty breathing and lower abdominal pain for which she returned to the ED.  She also reports having sternal chest pain and a "heaviness" on her chest without radiation to jaw or upper extremities.  She difficulty breathing she attributes to not being able to take deep breaths due to her "rib pain" and does not feel "out of breath."  She denies any aggravating or alleviating factors.  She has some nausea and lightheadedness upon standing, but denies dizziness, vomiting, diarrhea, fever, chills, sweats, abnormal vaginal discharge, recent travel and lower extremity pain/tenderness.  Of note, she does report a 20 lb unintentional weight loss since moving from [place] in January.  She feels that she has had normal appetite and diet since then.    She states that she has had heavy menstrual periods since menarche, using nearly an entire 32 pack of pads plus tampons for each menstrual cycle, though she admits that she does changes pads often and not  always at saturation.  Her periods are normally very regular and she denies metrorrhagia.  However, this month her period was irregular for the first time noting that she had light spotting x5 days 2 weeks earlier than usual, then had normal period 2 weeks later.  She is not currently menstruating and has not had any active bleeding for 2 weeks.  She is not taking OCPs and does not have IUD/implantable contraceptive.  She has not seen Ob/Gyn since the birth of her child in 2011.   PMH: - Anxiety  PSH: - none  OB/GYN: - [id number] - Full term normal vaginal delivery in 2011 - Abortion in March 2014 - History of menorrhagia since menarche  SH: The patient lives at home with her grandmother, son, 2 nieces and a more distant relative she calls her "sister."  She does not currently attend school nor does she work.  She denies tobacco use, has rare EtOH consumption, and denies illicit drug use.  She is sexually active with one current partner, has had 2 new partners recently, and has had a total of 7 sexual partners in the past, all female.  She usually uses condoms for protection but does not always use protection.  She has had multiple STI screenings but is unsure of her current status.  Meds: Current Facility-Administered Medications  Medication Dose Route Frequency  Provider Last Rate Last Dose  . 0.9 %  sodium chloride infusion   Intravenous Continuous Lorretta Harp, MD 100 mL/hr at 05/26/13 1302    . cefTRIAXone (ROCEPHIN) 1 g in dextrose 5 % 50 mL IVPB  1 g Intravenous Q24H Lorretta Harp, MD 100 mL/hr at 05/26/13 1301 1 g at 05/26/13 1301   Current Outpatient Prescriptions  Medication Sig Dispense Refill  . cephALEXin (KEFLEX) 500 MG capsule Take 1 capsule (500 mg total) by mouth once.  28 capsule  0  . phenazopyridine (PYRIDIUM) 200 MG tablet Take 1 tablet (200 mg total) by mouth once.  6 tablet  0  . metroNIDAZOLE (FLAGYL) 500 MG tablet Take 1 tablet (500 mg total) by mouth 2 (two) times daily.  14  tablet  0    Allergies: NKDA   Review of Systems: Pertinent items are noted in HPI.  Physical Exam: Blood pressure 99/61, pulse 58, temperature 98.7 F (37.1 C), temperature source Oral, resp. rate 18, last menstrual period 04/20/2013, SpO2 100.00%. General: patient is laying in ED bed in no apparent distress, she appears fatigued and pale, slightly yellowish HEENT: head is normocephalic atraumatic, dry mucous membranes, oropharynx normal CV: RRR, normal S1 and S2 with physiologic splitting, no m/r/g Lungs:  Clear to ausculation bilaterally, no wheezes, ronchi, or crackles; +CVA tenderness on left Abd:  +normal bowel sounds, soft, non-distended, no organomegaly, mild suprapubic and RLQ tenderness without guarding or rebound tenderness Ext: no lower ext swelling, redness or tenderness Psych: AAOx3, cooperative and pleasant   Lab results: CBC    Component Value Date/Time   WBC 5.6 05/26/2013 1040   RBC 3.40* 05/26/2013 1040   HGB 6.6* 05/26/2013 1040   HCT 22.2* 05/26/2013 1040   PLT 301 05/26/2013 1040   MCV 65.3* 05/26/2013 1040   MCH 19.4* 05/26/2013 1040   MCHC 29.7* 05/26/2013 1040   RDW 17.2* 05/26/2013 1040   LYMPHSABS 2.0 05/26/2013 0940   MONOABS 0.4 05/26/2013 0940   EOSABS 0.3 05/26/2013 0940   BASOSABS 0.0 05/26/2013 0940    BMET    Component Value Date/Time   NA 139 05/26/2013 0940   K 3.4* 05/26/2013 0940   CL 107 05/26/2013 0940   CO2 24 05/26/2013 0940   GLUCOSE 99 05/26/2013 0940   BUN 11 05/26/2013 0940   CREATININE 0.92 05/26/2013 0940   CALCIUM 8.2* 05/26/2013 0940   GFRNONAA 90* 05/26/2013 0940   GFRAA >90 05/26/2013 0940   Lipase     Component Value Date/Time   LIPASE 19 05/26/2013 0940    Urinalysis    Component Value Date/Time   COLORURINE RED* 05/26/2013 0444   APPEARANCEUR TURBID* 05/26/2013 0444   LABSPEC 1.036* 05/26/2013 0444   PHURINE 6.0 05/26/2013 0444   GLUCOSEU NEGATIVE 05/26/2013 0444   HGBUR LARGE* 05/26/2013 0444   BILIRUBINUR SMALL* 05/26/2013  0444   KETONESUR 15* 05/26/2013 0444   PROTEINUR >300* 05/26/2013 0444   UROBILINOGEN 1.0 05/26/2013 0444   NITRITE NEGATIVE 05/26/2013 0444   LEUKOCYTESUR LARGE* 05/26/2013 0444   UPT: negative  FOBT: negative    Imaging results:  Ct Abdomen Pelvis Wo Contrast  05/26/2013   CLINICAL DATA:  Nausea.  EXAM: CT ABDOMEN AND PELVIS WITHOUT CONTRAST  TECHNIQUE: Multidetector CT imaging of the abdomen and pelvis was performed following the standard protocol without intravenous contrast.  COMPARISON:  None  FINDINGS: Limited visualization of the lung bases demonstrated a 4 mm nodule within the left lower lobe (image 5; series 3). Normal  heart size. Low attenuation of the blood pool is coronary seen with anemia.  Lack of intravenous contrast material limits evaluation of the solid organ parenchyma. The liver, spleen, pancreas and bilateral adrenal glands are unremarkable. Kidneys are symmetric in size. No hydronephrosis. No nephroureterolithiasis. Urinary bladder is grossly unremarkable. Uterus and bilateral adnexal structures are grossly  Normal caliber abdominal aorta. No retroperitoneal or pelvic lymphadenopathy.  No abnormal bowel wall thickening. No evidence for bowel obstruction. No free fluid or free intraperitoneal air. Normal appendix.  No aggressive or acute appearing osseous lesions.  IMPRESSION: 1. No nephroureterolithiasis. No hydronephrosis. 2. Normal appendix   Electronically Signed   By: Annia Belt M.D.   On: 05/26/2013 09:16     Assessment & Plan by Problem: Principal Problem:   Anemia Active Problems:   UTI (lower urinary tract infection)  1. Symptomatic Anemia - w/ Hgb of 6.6; microcytic anemia likely secondary to long-standing history of menorrhagia since menarche; patient complains of chest heaviness and difficulty breathing but attributes it to "rib pain" that was not very reproducible on exam.  Low concern for MI or PE.   - admit to floor - type & cross - INR - anemia panel -  transfuse 2 units pRBC - repeat Hbg after transfusion tonight and tomorrow AM - EKG - troponin x1 - IVF - consider ferrous sulfate po vs IV - reg diet  2. Pyelonephritis - urinalysis diagnosed this morning on prior ED visit, but now with symptoms concerning for possible pyelonephritis, although the absence of fever and lack of leukocytosis make the picture less clear.  May be a possibility in the setting of immunodeficiency, possible HIV given her weight loss and sexually risky behavior.  Her flank pain is evident on exam, however may also be MSK related pain.  Differential also includes uncomplicated UTI and PID. - will treat as pyelonephritis - IV ceftriaxone 1g daily - will get STI panel, including GC, HZV and HIV screen - f/u urine cultures and sensitivities  3. Bradycardia - patient noted to be bradycardic to 50's, could be patient's normal baseline but uncertain; however, this seems unusual in the setting of mild hypotension and severe anemia, which is somewhat puzzling and warrants concern for cardiac etiology. - will continue to monitor and f/u EKG  Dispo: potential discharge tomorrow afternoon if Hgb is stable and symptoms improve; will arrange f/u with [facility] IM clinic in 7-10 days and give referral [facility] for [facility];   This is a Psychologist, occupational Note.  The care of the patient was discussed with Dr. Brien Few and the assessment and plan was formulated with their assistance.  Please see their note for official documentation of the patient encounter.   Signed: Lowella Fairy, Med Student 05/26/2013, 1:15 PM

## 2013-05-26 NOTE — ED Notes (Signed)
Pt c/o lower abdominal pain. Pt reports dysuria, spotting. Pt states she has a hx of UTI's, feels like the same.

## 2013-05-26 NOTE — ED Notes (Signed)
Asked patient to gown up for CT

## 2013-05-26 NOTE — ED Notes (Signed)
Report called to RN.

## 2013-05-26 NOTE — ED Provider Notes (Signed)
CSN: [id number]     Arrival date & time 05/26/13  0805 History   First MD Initiated Contact with Patient 05/26/13 [phone]     Chief Complaint  Patient presents with  . Rib Pain    (Consider location/radiation/quality/duration/timing/severity/associated sxs/prior Treatment) HPI Comments: Patient is a 17 year old female who presents with bilateral flank pain that started within the past 2 hours. Patient was seen in the ED 4 hours ago and was diagnosed with a UTI. Patient reports sudden onset of bilateral flank pain that radiates around to her abdomen. The pain is described as sharp and aching and severe. No alleviating/aggravating factors. The patient has tried nothing for symptoms without relief. Associated symptoms include nausea. Patient denies fever, headache, vomiting, diarrhea, chest pain, SOB, constipation, abnormal vaginal bleeding/discharge.      Past Medical History  Diagnosis Date  . Infection     UTI  . UTI (lower urinary tract infection)    Past Surgical History  Procedure Laterality Date  . No past surgeries    . Induced abortion     No family history on file. History  Substance Use Topics  . Smoking status: Never Smoker   . Smokeless tobacco: Never Used  . Alcohol Use: No   OB History   Grav Para Term Preterm Abortions TAB SAB Ect Mult Living   2 1 1       1      Review of Systems  Gastrointestinal: Positive for nausea.  Genitourinary: Positive for flank pain.  All other systems reviewed and are negative.    Allergies  Review of patient's allergies indicates no known allergies.  Home Medications   Current Outpatient Rx  Name  Route  Sig  Dispense  Refill  . cephALEXin (KEFLEX) 500 MG capsule   Oral   Take 1 capsule (500 mg total) by mouth once.   28 capsule   0   . phenazopyridine (PYRIDIUM) 200 MG tablet   Oral   Take 1 tablet (200 mg total) by mouth once.   6 tablet   0   . metroNIDAZOLE (FLAGYL) 500 MG tablet   Oral   Take 1 tablet (500 mg  total) by mouth 2 (two) times daily.   14 tablet   0    BP 90/49  Pulse 73  Temp(Src) 98.7 F (37.1 C) (Oral)  Resp 12  SpO2 99%  LMP 04/20/2013 Physical Exam  Nursing note and vitals reviewed. Constitutional: She is oriented to person, place, and time. She appears well-developed and well-nourished. No distress.  HENT:  Head: Normocephalic and atraumatic.  Eyes: Conjunctivae and EOM are normal.  Neck: Normal range of motion.  Cardiovascular: Normal rate and regular rhythm.  Exam reveals no gallop and no friction rub.   No murmur heard. Pulmonary/Chest: Effort normal and breath sounds normal. She has no wheezes. She has no rales. She exhibits no tenderness.  Abdominal: Soft. She exhibits no distension. There is no tenderness. There is no rebound and no guarding.  Genitourinary:  Bilateral CVA tenderness.   Musculoskeletal: Normal range of motion.  Neurological: She is alert and oriented to person, place, and time. Coordination normal.  Speech is goal-oriented. Moves limbs without ataxia.   Skin: Skin is warm and dry.  Psychiatric: She has a normal mood and affect. Her behavior is normal.    ED Course  Procedures (including critical care time)  CRITICAL CARE Performed by: Emilia Beck   Total critical care time: 30 minutes  Critical care  time was exclusive of separately billable procedures and treating other patients.  Critical care was necessary to treat or prevent imminent or life-threatening deterioration.  Critical care was time spent personally by me on the following activities: development of treatment plan with patient and/or surrogate as well as nursing, discussions with consultants, evaluation of patient's response to treatment, examination of patient, obtaining history from patient or surrogate, ordering and performing treatments and interventions, ordering and review of laboratory studies, ordering and review of radiographic studies, pulse oximetry and  re-evaluation of patient's condition.   Labs Review Labs Reviewed  CBC WITH DIFFERENTIAL - Abnormal; Notable for the following:    RBC 3.36 (*)    Hemoglobin 6.5 (*)    HCT 21.9 (*)    MCV 65.2 (*)    MCH 19.3 (*)    MCHC 29.7 (*)    RDW 17.2 (*)    Eosinophils Relative 6 (*)    All other components within normal limits  COMPREHENSIVE METABOLIC PANEL - Abnormal; Notable for the following:    Potassium 3.4 (*)    Calcium 8.2 (*)    Albumin 3.2 (*)    GFR calc non Af Amer 90 (*)    All other components within normal limits  CBC - Abnormal; Notable for the following:    RBC 3.40 (*)    Hemoglobin 6.6 (*)    HCT 22.2 (*)    MCV 65.3 (*)    MCH 19.4 (*)    MCHC 29.7 (*)    RDW 17.2 (*)    All other components within normal limits  IRON AND TIBC - Abnormal; Notable for the following:    Iron 19 (*)    Saturation Ratios 5 (*)    All other components within normal limits  FERRITIN - Abnormal; Notable for the following:    Ferritin 3 (*)    All other components within normal limits  RETICULOCYTES - Abnormal; Notable for the following:    RBC. 3.71 (*)    All other components within normal limits  CBC - Abnormal; Notable for the following:    Hemoglobin 9.3 (*)    HCT 30.0 (*)    MCV 69.8 (*)    MCH 21.6 (*)    RDW 20.0 (*)    All other components within normal limits  GC/CHLAMYDIA PROBE AMP  LIPASE, BLOOD  VITAMIN B12  FOLATE  TROPONIN I  RPR  PROTIME-INR  HIV ANTIBODY (ROUTINE TESTING)  MAGNESIUM  CBC  OCCULT BLOOD, POC DEVICE  TYPE AND SCREEN  PREPARE RBC (CROSSMATCH)  ABO/RH   Imaging Review Ct Abdomen Pelvis Wo Contrast  05/26/2013   CLINICAL DATA:  Nausea.  EXAM: CT ABDOMEN AND PELVIS WITHOUT CONTRAST  TECHNIQUE: Multidetector CT imaging of the abdomen and pelvis was performed following the standard protocol without intravenous contrast.  COMPARISON:  None  FINDINGS: Limited visualization of the lung bases demonstrated a 4 mm nodule within the left lower  lobe (image 5; series 3). Normal heart size. Low attenuation of the blood pool is coronary seen with anemia.  Lack of intravenous contrast material limits evaluation of the solid organ parenchyma. The liver, spleen, pancreas and bilateral adrenal glands are unremarkable. Kidneys are symmetric in size. No hydronephrosis. No nephroureterolithiasis. Urinary bladder is grossly unremarkable. Uterus and bilateral adnexal structures are grossly  Normal caliber abdominal aorta. No retroperitoneal or pelvic lymphadenopathy.  No abnormal bowel wall thickening. No evidence for bowel obstruction. No free fluid or free intraperitoneal air. Normal appendix.  No aggressive or acute appearing osseous lesions.  IMPRESSION: 1. No nephroureterolithiasis. No hydronephrosis. 2. Normal appendix   Electronically Signed   By: Annia Belt M.D.   On: 05/26/2013 09:16    MDM   1. Anemia requiring transfusions   2. Acute pyelonephritis   3. Anemia     8:22 AM CT abdomen pelvis pending to rule out kidney stone. Patient will have IV fluids, morphine and zofran. Vitals stable and patient afebrile.   10:42 AM Patient's hemoglobin is 6.5. Will repeat CBC and add type and screen. Patient will be transfused and admitted by internal medicine.    Emilia Beck, PA-C 05/27/13 [phone]

## 2013-05-26 NOTE — Progress Notes (Signed)
Called to room with c/o heaviness" on her chest without radiation to jaw or upper extremities, and  Sob  . Vitals 98.7 51 18 93/53  100% 0n r/a, also c/o tip of tongue  Burning, stinging, but no edema /swelling noted but edges are  Red. Patient has no difficulty in swallowing , ate chocolate and  Ginger ale, tolerated well. Will notify MD .

## 2013-05-27 DIAGNOSIS — D649 Anemia, unspecified: Secondary | ICD-10-CM

## 2013-05-27 DIAGNOSIS — N390 Urinary tract infection, site not specified: Secondary | ICD-10-CM

## 2013-05-27 LAB — CBC
HCT: 29.4 % — ABNORMAL LOW (ref 36.0–46.0)
Hemoglobin: 9.2 g/dL — ABNORMAL LOW (ref 12.0–15.0)
MCH: 21.7 pg — ABNORMAL LOW (ref 26.0–34.0)
MCHC: 31.3 g/dL (ref 30.0–36.0)
MCV: 69.3 fL — ABNORMAL LOW (ref 78.0–100.0)
Platelets: 278 10*3/uL (ref 150–400)
RBC: 4.24 MIL/uL (ref 3.87–5.11)
RDW: 19.9 % — ABNORMAL HIGH (ref 11.5–15.5)
WBC: 4.5 10*3/uL (ref 4.0–10.5)

## 2013-05-27 LAB — URINE CULTURE: Colony Count: 70000

## 2013-05-27 LAB — TYPE AND SCREEN
ABO/RH(D): A POS
Antibody Screen: NEGATIVE
Unit division: 0
Unit division: 0

## 2013-05-27 LAB — LACTATE DEHYDROGENASE: LDH: 191 U/L (ref 94–250)

## 2013-05-27 MED ORDER — FERROUS SULFATE 325 (65 FE) MG PO TABS: 325.0000 mg | ORAL_TABLET | Freq: Three times a day (TID) | ORAL | Status: DC

## 2013-05-27 MED ORDER — DOCUSATE SODIUM 100 MG PO CAPS: 100.0000 mg | ORAL_CAPSULE | Freq: Two times a day (BID) | ORAL | Status: DC | PRN

## 2013-05-27 MED ORDER — SULFAMETHOXAZOLE-TMP DS 800-160 MG PO TABS: 1.0000 | ORAL_TABLET | Freq: Two times a day (BID) | ORAL | Status: DC

## 2013-05-27 MED ORDER — PHENAZOPYRIDINE HCL 100 MG PO TABS
100.0000 mg | ORAL_TABLET | Freq: Three times a day (TID) | ORAL | Status: DC
  Administered 2013-05-27 (×2): 100 mg via ORAL
  Filled 2013-05-27 (×5): qty 1

## 2013-05-27 NOTE — Progress Notes (Signed)
Subjective: Patient is currently stable and feeling better today, complaining about IV in her arm, ready to go home.  No acute events overnight.  Objective: Vital signs in last 24 hours:  Vital Signs:   Filed Vitals:   05/26/13 1832 05/26/13 1940 05/27/13 0535 05/27/13 0820  BP: 90/65 94/57 100/66 104/59  Pulse: 55 56 52 60  Temp: 98.5 F (36.9 C) 97.7 F (36.5 C) 98.3 F (36.8 C) 98.4 F (36.9 C)  TempSrc: Oral Oral Oral Oral  Resp: 18 18 15 18   Height:      Weight:   76.4 kg (168 lb 6.9 oz)   SpO2: 100% 100% 98% 98%   Temp:  [97.7 F (36.5 C)-98.8 F (37.1 C)] 98.4 F (36.9 C) (10/08 0820) Pulse Rate:  [49-81] 60 (10/08 0820) Resp:  [14-21] 18 (10/08 0820) BP: (90-114)/(47-87) 104/59 mmHg (10/08 0820) SpO2:  [98 %-100 %] 98 % (10/08 0820) Weight:  [76.4 kg (168 lb 6.9 oz)-78.8 kg (173 lb 11.6 oz)] 76.4 kg (168 lb 6.9 oz) (10/08 0535)    Intake/Output:   Intake/Output Summary (Last 24 hours) at 05/27/13 1357 Last data filed at 05/27/13 1000  Gross per 24 hour  Intake 1358.33 ml  Output      2 ml  Net 1356.33 ml      Physical Exam: General: Vital signs reviewed and noted. Well-developed, well-nourished, in no acute distress; alert, appropriate and cooperative throughout examination.  HEENT: Atraumatic, normocephalic, neck is supple  Lungs:  Normal respiratory effort. Clear to auscultation BL without crackles or wheezes.  Heart: RRR. S1 and S2 normal without gallop, murmur, or rubs.  Abdomen:  BS normoactive. Soft, Nondistended, non-tender.  No masses or organomegaly.  Extremities: No pretibial edema.   Lab Results: Basic Metabolic Panel:  Recent Labs Lab 05/26/13 0940 05/26/13 1635  NA 139  --   K 3.4*  --   CL 107  --   CO2 24  --   GLUCOSE 99  --   BUN 11  --   CREATININE 0.92  --   CALCIUM 8.2*  --   MG  --  1.8    CBC:  Recent Labs Lab 05/26/13 0940 05/26/13 1040 05/26/13 2246 05/27/13 1020  WBC 5.4 5.6 6.2 4.5  NEUTROABS 2.6  --    --   --   HGB 6.5* 6.6* 9.3* 9.2*  HCT 21.9* 22.2* 30.0* 29.4*  MCV 65.2* 65.3* 69.8* 69.3*  PLT 282 301 279 278   Iron/TIBC/Ferritin    Component Value Date/Time   IRON 19* 05/26/2013 1633   TIBC 386 05/26/2013 1633   FERRITIN 3* 05/26/2013 1633   Folate 9.7  VitaminB12 540 Liver Function Tests:  Recent Labs Lab 05/26/13 0940  AST 24  ALT 12  ALKPHOS 55  BILITOT 0.3  PROT 6.3  ALBUMIN 3.2*    Recent Labs Lab 05/26/13 0940  LIPASE 19   No results found for this basename: AMMONIA,  in the last 168 hours  Cardiac Enzymes:  Recent Labs Lab 05/26/13 1634  TROPONINI <0.30    Microbiology: Results for orders placed during the hospital encounter of 05/26/13  URINE CULTURE     Status: None   Collection Time    05/26/13  4:44 AM      Result Value Range Status   Specimen Description URINE, RANDOM   Final   Special Requests NONE   Final   Culture  Setup Time     Final   Value: 05/26/2013  05:29     Performed at [facility] Count     Final   Value: 70,000 COLONIES/ML     Performed at [facility]   Culture     Final   Value: Multiple bacterial morphotypes present, none predominant. Suggest appropriate recollection if clinically indicated.     Performed at [facility]   Report Status 05/27/2013 FINAL   Final   Recent Results (from the past 240 hour(s))  URINE CULTURE     Status: None   Collection Time    05/26/13  4:44 AM      Result Value Range Status   Specimen Description URINE, RANDOM   Final   Special Requests NONE   Final   Culture  Setup Time     Final   Value: 05/26/2013 05:29     Performed at [facility] Count     Final   Value: 70,000 COLONIES/ML     Performed at [facility]   Culture     Final   Value: Multiple bacterial morphotypes present, none predominant. Suggest appropriate recollection if clinically indicated.     Performed at [facility]   Report Status 05/27/2013  FINAL   Final    Coagulation Studies:  Recent Labs  05/26/13 1635  LABPROT 13.9  INR 1.09     Other results: EKG: no signs of ischemia, borderline right-axis deviation, early R-wave progression  Imaging: Ct Abdomen Pelvis Wo Contrast  05/26/2013   CLINICAL DATA:  Nausea.  EXAM: CT ABDOMEN AND PELVIS WITHOUT CONTRAST  TECHNIQUE: Multidetector CT imaging of the abdomen and pelvis was performed following the standard protocol without intravenous contrast.  COMPARISON:  None  FINDINGS: Limited visualization of the lung bases demonstrated a 4 mm nodule within the left lower lobe (image 5; series 3). Normal heart size. Low attenuation of the blood pool is coronary seen with anemia.  Lack of intravenous contrast material limits evaluation of the solid organ parenchyma. The liver, spleen, pancreas and bilateral adrenal glands are unremarkable. Kidneys are symmetric in size. No hydronephrosis. No nephroureterolithiasis. Urinary bladder is grossly unremarkable. Uterus and bilateral adnexal structures are grossly  Normal caliber abdominal aorta. No retroperitoneal or pelvic lymphadenopathy.  No abnormal bowel wall thickening. No evidence for bowel obstruction. No free fluid or free intraperitoneal air. Normal appendix.  No aggressive or acute appearing osseous lesions.  IMPRESSION: 1. No nephroureterolithiasis. No hydronephrosis. 2. Normal appendix   Electronically Signed   By: Annia Belt M.D.   On: 05/26/2013 09:16    Medications: I have reviewed the patient's current medications. Scheduled Meds: . cefTRIAXone (ROCEPHIN)  IV  1 g Intravenous Q24H  . ferrous sulfate  325 mg Oral TID WC  . phenazopyridine  100 mg Oral TID WC   Continuous Infusions: . sodium chloride 100 mL/hr at 05/26/13 1302   PRN Meds:.acetaminophen, ondansetron  Assessment/Plan: Principal Problem:   Anemia Active Problems:   UTI   Summary: Melanie Hancock is a 17 y.o. who was admitted for microcytic anemia with H/H of  6.6/22.2 and concern for pyelonephritis vs uncomplicated UTI.  Her anemia panel showed normal folate and B12 levels, saturation ratio of 5%, serum iron of 19 and ferritin of 3, which is all consistent with Fe deficiency anemia, likely related to her menorrhagia. She has received two units of pRBCs and has had an appropriate and stable increase in her H/H.  Although we initially treated  her for potential mild pyelonephritis given her left-sided CVA tenderness otherwise unexplained, our suspicion for frank pyelonephritis is low and treatment as uncomplicated UTI is reasonable.  Urine culture returned with numerous bacteria without predominance and may represent contamination.  Impression: 1. Fe deficiency anemia - H/H stable ~9.0/~30.0 after 2 units of pRBCs - will have patient follow-up for repeat CBC as outpatient - prescribed ferrous sulfate 325mg  PO TID and colace - will have care established with OB/GYN after hospital follow-up visit next week  2. Uncomplicated UTI - patient received IV ceftriaxone 1g x2 - will discharge patient with rx for PO bactrim 800-160mg  BID x 3 days for uncomplicated UTI - continue home meds of flagyl and pyridine after discharge, will not restart Keflex - GC urine probe collected, will follow-up results in outpatient clinic  3. Bradycardia - EKG, troponins all not concerning for ischemia, no signs of heart block - patient is asymptomatic - likely to be patient's baseline  Dispo: discharge to home today; patient has appointment on 10/17 with Dr. Zada Girt and case management will set her up with follow-up and [facility] eligibility appointments [facility].   This is a Psychologist, occupational Note.  The care of the patient was discussed with Dr. Lorretta Harp and the assessment and plan formulated with their assistance.  Please see their attached note for official documentation of the daily encounter.   LOS: 1 day   Signed: Arna Snipe Internal  Medicine, MS4 Pager [phone] 05/27/2013, 1:57 PM

## 2013-05-27 NOTE — Progress Notes (Signed)
I have seen the patient and reviewed the note by medical student,  Chris Hwang and discussed the care of the patient with him. Please see my note for documentation of my findings, assessment, and plans  Retal Tonkinson, MD PGY3, Internal Medicine Teaching Service Pager: [phone]   

## 2013-05-27 NOTE — H&P (Signed)
INTERNAL MEDICINE TEACHING SERVICE Attending Admission Note  Date: 05/27/2013  Patient name: Melanie Hancock  Medical record number: [id number]  Date of birth: 1996/01/01    I have seen and evaluated [name] and discussed their care with the Residency Team.  17 year old AAF w/ hx menorrhagia, presented with flank pain, urinary frequency, SOB.  The patient had symptomatic anemia, severe iron deficiency due to chronic blood loss through menstrual loss. She received 2 units of PRBCs and her SOB and pain is resolved.  Her UA is not a good collection, but she is symptomatic. I would treat her for 3 days with Cipro and follow culture data as outpatient. I do not think she has pyelonephritis (no fever, no leukocytosis).  Needs PCP follow up and Gyn referral as outpatient. FeSO4 325 mg po tid. Colace 100 mg po qday.  She is symptomatically better. There was no evidence of hemolysis. Medically stable for discharge.  Jonah Blue, DO, FACP Faculty [facility] Internal Medicine Residency Program 05/27/2013, 10:48 AM

## 2013-05-27 NOTE — Progress Notes (Signed)
Subjective:  - Patient feels better. No SOB or chest pain.  No fever or chills. - Hgb 9.2 after transfused with 2 U of blood.  Objective: Vital signs in last 24 hours: Filed Vitals:   05/26/13 1832 05/26/13 1940 05/27/13 0535 05/27/13 0820  BP: 90/65 94/57 100/66 104/59  Pulse: 55 56 52 60  Temp: 98.5 F (36.9 C) 97.7 F (36.5 C) 98.3 F (36.8 C) 98.4 F (36.9 C)  TempSrc: Oral Oral Oral Oral  Resp: 18 18 15 18   Height:      Weight:   168 lb 6.9 oz (76.4 kg)   SpO2: 100% 100% 98% 98%   Weight change:   Intake/Output Summary (Last 24 hours) at 05/27/13 1043 Last data filed at 05/26/13 2300  Gross per 24 hour  Intake 1118.33 ml  Output      1 ml  Net 1117.33 ml    General: Not in acute distress. Patient is pale  HEENT: PERRL, EOMI, no scleral icterus, No JVD or bruit  Cardiac: S1/S2, RRR, No murmurs, gallops or rubs  Lungs: Clear to ausculation bilaterally, no wheezes, ronchi, or crackles;  Abd: +normal bowel sounds, soft, non-distended, no organomegaly, mild suprapubic and RLQ tenderness without guarding or rebound tenderness. Positive CVA tenderness on left side.  Ext: No edema. 2+DP/PT pulse bilaterally  Musculoskeletal: No joint deformities, erythema, or stiffness, ROM full  Skin: No rashes.  Neuro: Alert and oriented X3, cranial nerves II-XII grossly intact, muscle strength 5/5 in all extremeties, sensation to light touch intact.  Psych: Patient is not psychotic, no suicidal or hemocidal ideation.   Lab Results: Basic Metabolic Panel:  Recent Labs Lab 05/26/13 0940 05/26/13 1635  NA 139  --   K 3.4*  --   CL 107  --   CO2 24  --   GLUCOSE 99  --   BUN 11  --   CREATININE 0.92  --   CALCIUM 8.2*  --   MG  --  1.8   Liver Function Tests:  Recent Labs Lab 05/26/13 0940  AST 24  ALT 12  ALKPHOS 55  BILITOT 0.3  PROT 6.3  ALBUMIN 3.2*    Recent Labs Lab 05/26/13 0940  LIPASE 19   No results found for this basename: AMMONIA,  in the last 168  hours CBC:  Recent Labs Lab 05/26/13 0940 05/26/13 1040 05/26/13 2246  WBC 5.4 5.6 6.2  NEUTROABS 2.6  --   --   HGB 6.5* 6.6* 9.3*  HCT 21.9* 22.2* 30.0*  MCV 65.2* 65.3* 69.8*  PLT 282 301 279   Cardiac Enzymes:  Recent Labs Lab 05/26/13 1634  TROPONINI <0.30   BNP: No results found for this basename: PROBNP,  in the last 168 hours D-Dimer: No results found for this basename: DDIMER,  in the last 168 hours CBG: No results found for this basename: GLUCAP,  in the last 168 hours Hemoglobin A1C: No results found for this basename: HGBA1C,  in the last 168 hours Fasting Lipid Panel: No results found for this basename: CHOL, HDL, LDLCALC, TRIG, CHOLHDL, LDLDIRECT,  in the last 168 hours Thyroid Function Tests: No results found for this basename: TSH, T4TOTAL, FREET4, T3FREE, THYROIDAB,  in the last 168 hours Coagulation:  Recent Labs Lab 05/26/13 1635  LABPROT 13.9  INR 1.09   Anemia Panel:  Recent Labs Lab 05/26/13 1633  VITAMINB12 540  FOLATE 9.7  FERRITIN 3*  TIBC 386  IRON 19*  RETICCTPCT 1.0  Urine Drug Screen: Drugs of Abuse  No results found for this basename: labopia, cocainscrnur, labbenz, amphetmu, thcu, labbarb    Alcohol Level: No results found for this basename: ETH,  in the last 168 hours Urinalysis:  Recent Labs Lab 05/26/13 0444  COLORURINE RED*  LABSPEC 1.036*  PHURINE 6.0  GLUCOSEU NEGATIVE  HGBUR LARGE*  BILIRUBINUR SMALL*  KETONESUR 15*  PROTEINUR >300*  UROBILINOGEN 1.0  NITRITE NEGATIVE  LEUKOCYTESUR LARGE*   Misc. Labs:  Micro Results: Recent Results (from the past 240 hour(s))  URINE CULTURE     Status: None   Collection Time    05/26/13  4:44 AM      Result Value Range Status   Specimen Description URINE, RANDOM   Final   Special Requests NONE   Final   Culture  Setup Time     Final   Value: 05/26/2013 05:29     Performed at [facility] Count     Final   Value: 70,000 COLONIES/ML      Performed at [facility]   Culture     Final   Value: Multiple bacterial morphotypes present, none predominant. Suggest appropriate recollection if clinically indicated.     Performed at [facility]   Report Status 05/27/2013 FINAL   Final   Studies/Results: Ct Abdomen Pelvis Wo Contrast  05/26/2013   CLINICAL DATA:  Nausea.  EXAM: CT ABDOMEN AND PELVIS WITHOUT CONTRAST  TECHNIQUE: Multidetector CT imaging of the abdomen and pelvis was performed following the standard protocol without intravenous contrast.  COMPARISON:  None  FINDINGS: Limited visualization of the lung bases demonstrated a 4 mm nodule within the left lower lobe (image 5; series 3). Normal heart size. Low attenuation of the blood pool is coronary seen with anemia.  Lack of intravenous contrast material limits evaluation of the solid organ parenchyma. The liver, spleen, pancreas and bilateral adrenal glands are unremarkable. Kidneys are symmetric in size. No hydronephrosis. No nephroureterolithiasis. Urinary bladder is grossly unremarkable. Uterus and bilateral adnexal structures are grossly  Normal caliber abdominal aorta. No retroperitoneal or pelvic lymphadenopathy.  No abnormal bowel wall thickening. No evidence for bowel obstruction. No free fluid or free intraperitoneal air. Normal appendix.  No aggressive or acute appearing osseous lesions.  IMPRESSION: 1. No nephroureterolithiasis. No hydronephrosis. 2. Normal appendix   Electronically Signed   By: Annia Belt M.D.   On: 05/26/2013 09:16   Medications:  Scheduled Meds: . cefTRIAXone (ROCEPHIN)  IV  1 g Intravenous Q24H  . ferrous sulfate  325 mg Oral TID WC  . phenazopyridine  100 mg Oral TID WC   Continuous Infusions: . sodium chloride 100 mL/hr at 05/26/13 1302   PRN Meds:.acetaminophen, acetaminophen, ondansetron Assessment/Plan:  1. Symptomatic Anemia - Hgb was 6.3 on admission.it is microcytic anemia with MCV 65.3. It is mostly likely secondary to  long-standing history of menorrhagia since menarche. Other different diagnoses include, but less likely, GI bleeding (FOBT negative), hereditary blood disorder, such as thalassemia (patient denies any family history of blood disorder) and hemolysis (less likely given normal bilirubin and LDH). Anemia is symptomatic (chest pain), patient was transfused with 2 units of blood with improvement of H/H to 9.3/30.0 and was stable at 9.2/29.4 on day of discharge. Patient's chest pain resolved completely. Her troponin was negative. Anemia panel showed iron deficiency anemia. Patient was started with ferrous sulfate and colace, and will continue at discharge. Patient will follow-up in clinic in 1 week for  repeat CBC, and directions to provide the patient with a referral to OB/GYN for continued care.   2. UTI -Urinalysis was positive for UTI. Patient dose not have fever and leukocytosis, making pyelonephritis less likely diagnosis per Dr. Kem Kays. CT abdomen did not show kidney stone. Patient was initially treated with ceftriaxone by IV and switch to Bactrim for 3 days at discharge. She will continue Flagyl and Pyridine as prescribed from prior ED visit and discontinue Kelfex. STD work-up for syphilis and HIV was negative, urine GC probe was collected prior to discharge. Patient will follow-up in clinic in 1 week.   3. Bradycardia - Patient noted to be bradycardic to 50's, could be patient's normal baseline but uncertain. Her troponin was negative. Patient is asymptomatic at discharge. No further workup needed.   # DVT px: SCD  Dispo: will d/c home and follow up in clinic in one week.    LOS: 1 day   Lorretta Harp 05/27/2013, 10:43 AM

## 2013-05-27 NOTE — Discharge Summary (Signed)
Patient Name:  Melanie Hancock  MRN: [id number]  PCP: No PCP Per Patient  DOB:  13-Jul-1996       Date of Admission:  05/26/2013  Date of Discharge:  05/27/2013      Attending Physician: Dr. Jonah Blue, DO         DISCHARGE DIAGNOSES:  1.   Symptomatic Anemia 2.   UTI 3.   Bradycardia   DISPOSITION AND FOLLOW-UP: ICYSS SKOG is to follow-up with the listed providers as detailed below, at patient's visiting, please address following issues:  1. Repeat CBC to assure her Hgb is stable. 2. Please give referral to OB/GYN 3. Follow up GC/Chlamydia probe and urine culture result   Follow-up Information   Follow up with Dow Adolph, MD On 06/05/2013. (at 9:15 AM)    Specialty:  Internal Medicine   Contact information:   [address] [place] [address] [phone]       Future Appointments Provider Department Dept Phone   06/05/2013 9:15 AM Dow Adolph, MD [facility] [phone]       DISCHARGE MEDICATIONS:   Medication List    ASK your doctor about these medications       cephALEXin 500 MG capsule  Commonly known as:  KEFLEX  Take 1 capsule (500 mg total) by mouth once.     metroNIDAZOLE 500 MG tablet  Commonly known as:  FLAGYL  Take 1 tablet (500 mg total) by mouth 2 (two) times daily.     phenazopyridine 200 MG tablet  Commonly known as:  PYRIDIUM  Take 1 tablet (200 mg total) by mouth once.         CONSULTS: None     PROCEDURES PERFORMED:  Ct Abdomen Pelvis Wo Contrast  05/26/2013   CLINICAL DATA:  Nausea.  EXAM: CT ABDOMEN AND PELVIS WITHOUT CONTRAST  TECHNIQUE: Multidetector CT imaging of the abdomen and pelvis was performed following the standard protocol without intravenous contrast.  COMPARISON:  None  FINDINGS: Limited visualization of the lung bases demonstrated a 4 mm nodule within the left lower lobe (image 5; series 3). Normal heart size. Low attenuation of the blood pool is coronary seen  with anemia.  Lack of intravenous contrast material limits evaluation of the solid organ parenchyma. The liver, spleen, pancreas and bilateral adrenal glands are unremarkable. Kidneys are symmetric in size. No hydronephrosis. No nephroureterolithiasis. Urinary bladder is grossly unremarkable. Uterus and bilateral adnexal structures are grossly  Normal caliber abdominal aorta. No retroperitoneal or pelvic lymphadenopathy.  No abnormal bowel wall thickening. No evidence for bowel obstruction. No free fluid or free intraperitoneal air. Normal appendix.  No aggressive or acute appearing osseous lesions.  IMPRESSION: 1. No nephroureterolithiasis. No hydronephrosis. 2. Normal appendix   Electronically Signed   By: Annia Belt M.D.   On: 05/26/2013 09:16      ADMISSION DATA: H&P: Patient is a 17 yo AAF lady with PMH of heavy menstrual bleeding, who presented with SOB, flank pain and increased urinary frequency.   Patient was seen in Ed early today. She was diagnosed with UTI and was discharged home on Keflex and pyridium. Patient reports that she developed bilateral flank and suprapubic pain several hours ago. Patient reports her pain is sudden onset. The pain is sharp and aching and severe. No alleviating/aggravating factors. It was associated with nausea. Patient denies fever, vomiting, diarrhea.   She also reports having SOB, sternal "heaviness" without radiation. She does not  have cough or sputum production. She has some lightheadedness upon standing, but denies dizziness. No recent travel and pain over her calf areas. Patient was found to have Hgb of 6.6 in ED. Of note, she reports having had heavy menstrual periods since menarche. Her periods are normally very regular and she denies metrorrhagia. Last menstrual period was 2 weeks ago. She does not have active vaginal bleeding now. She is not taking any iron supplement. She dose not have family hx of blood disorder. She is not taking OCPs and does not have  IUD/implantable contraceptive.  She has not seen Ob/Gyn since the birth of her child in 2011. pregnancy test is negative in ED.   ROS:  Denies fever, headaches,  cough, diarrhea, constipation, rashes,  joint pain or leg swelling.  Physical Exam:     Filed Vitals:     05/26/13 0810  05/26/13 1049   BP:  90/49  99/61   Pulse:  73  58   Temp:  98.7 F (37.1 C)     TempSrc:  Oral     Resp:  12  18   SpO2:  99%  100%     Physical Exam:     Filed Vitals:     05/26/13 1700  05/26/13 1707  05/26/13 1710  05/26/13 1736   BP:  99/61  104/75  109/51  100/62   Pulse:  71  56  64  73   Temp:  98.3 F (36.8 C)      98.3 F (36.8 C)   TempSrc:  Oral      Oral   Resp:  18  18  18  16    Height:           Weight:           SpO2:  99%  100%  100%       General: Not in acute distress. Patient is pale HEENT: PERRL, EOMI, no scleral icterus, No JVD or bruit Cardiac: S1/S2, RRR, No murmurs, gallops or rubs Lungs:  Clear to ausculation bilaterally, no wheezes, ronchi, or crackles;   Abd:  +normal bowel sounds, soft, non-distended, no organomegaly, mild suprapubic and RLQ tenderness without guarding or rebound tenderness positive CVA tenderness on left side. Ext: No edema. 2+DP/PT pulse bilaterally Musculoskeletal: No joint deformities, erythema, or stiffness, ROM full Skin: No rashes.   Neuro: Alert and oriented X3, cranial nerves II-XII grossly intact, muscle strength 5/5 in all extremeties, sensation to light touch intact.   Psych: Patient is not psychotic, no suicidal or hemocidal ideation.  Lab results: Basic Metabolic Panel:  Recent Labs   05/26/13 0940   NA  139   K  3.4*   CL  107   CO2  24   GLUCOSE  99   BUN  11   CREATININE  0.92   CALCIUM  8.2*    Liver Function Tests:  Recent Labs   05/26/13 0940   AST  24   ALT  12   ALKPHOS  55   BILITOT  0.3   PROT  6.3   ALBUMIN  3.2*     Recent Labs   05/26/13 0940   LIPASE  19    No results found for this  basename: AMMONIA,  in the last 72 hours CBC:  Recent Labs   05/26/13 0940  05/26/13 1040   WBC  5.4  5.6   NEUTROABS  2.6   --    HGB  6.5*  6.6*   HCT  21.9*  22.2*   MCV  65.2*  65.3*   PLT  282  301    HOSPITAL COURSE:  1. Symptomatic Anemia - Hgb was 6.3 on admission.it is microcytic anemia with MCV 65.3. It is mostly likely secondary to long-standing history of menorrhagia since menarche. Other different diagnoses include, but less likely, GI bleeding (FOBT negative), hereditary blood disorder, such as thalassemia (patient denies any family history of blood disorder) and hemolysis (less likely given normal bilirubin and LDH). Anemia is symptomatic (chest pain), patient was transfused with 2 units of blood with improvement of H/H to 9.3/30.0 and was stable at 9.2/29.4 on day of discharge. Patient's chest pain resolved completely. Her troponin was negative. Anemia panel showed iron deficiency anemia. Patient was started with ferrous sulfate and colace, and will continue at discharge. Patient will follow-up in clinic in 1 week for repeat CBC, and directions to provide the patient with a referral to OB/GYN for continued care.   2. UTI -Urinalysis was positive for UTI. Patient dose not have fever and leukocytosis, making pyelonephritis less likely diagnosis per Dr. Kem Kays. CT abdomen did not show kidney stone. Patient was initially treated with ceftriaxone by IV and switch to Bactrim for 3 days at discharge. She will continue Flagyl and Pyridine as prescribed from prior ED visit and discontinue Kelfex. STD work-up for syphilis and HIV was negative, urine GC probe was collected prior to discharge. Patient will follow-up in clinic in 1 week.   3. Bradycardia - Patient noted to be bradycardic to 50's, could be patient's normal baseline but uncertain. Her troponin was negative. Patient is asymptomatic at discharge. No further workup needed.   DISCHARGE DATA: Vital Signs: BP 104/59  Pulse 60   Temp(Src) 98.4 F (36.9 C) (Oral)  Resp 18  Ht 5\' 6"  (1.676 m)  Wt 168 lb 6.9 oz (76.4 kg)  BMI 27.2 kg/m2  SpO2 98%  LMP 04/20/2013  Labs: Results for orders placed during the hospital encounter of 05/26/13 (from the past 24 hour(s))  CBC     Status: Abnormal   Collection Time    05/26/13 10:40 AM      Result Value Range   WBC 5.6  4.0 - 10.5 K/uL   RBC 3.40 (*) 3.87 - 5.11 MIL/uL   Hemoglobin 6.6 (*) 12.0 - 15.0 g/dL   HCT 16.1 (*) 09.6 - 04.5 %   MCV 65.3 (*) 78.0 - 100.0 fL   MCH 19.4 (*) 26.0 - 34.0 pg   MCHC 29.7 (*) 30.0 - 36.0 g/dL   RDW 40.9 (*) 81.1 - 91.4 %   Platelets 301  150 - 400 K/uL  OCCULT BLOOD, POC DEVICE     Status: None   Collection Time    05/26/13 10:58 AM      Result Value Range   Fecal Occult Bld NEGATIVE  NEGATIVE  TYPE AND SCREEN     Status: None   Collection Time    05/26/13 11:20 AM      Result Value Range   ABO/RH(D) A POS     Antibody Screen NEG     Sample Expiration 05/29/2013     Unit Number [id number]     Blood Component Type RED CELLS,LR     Unit division 00     Status of Unit ISSUED,FINAL     Transfusion Status OK TO TRANSFUSE     Crossmatch Result Compatible     Unit Number [id number]  Blood Component Type RED CELLS,LR     Unit division 00     Status of Unit ISSUED,FINAL     Transfusion Status OK TO TRANSFUSE     Crossmatch Result Compatible    ABO/RH     Status: None   Collection Time    05/26/13 11:20 AM      Result Value Range   ABO/RH(D) A POS    PREPARE RBC (CROSSMATCH)     Status: None   Collection Time    05/26/13 11:30 AM      Result Value Range   Order Confirmation ORDER PROCESSED BY BLOOD BANK    VITAMIN B12     Status: None   Collection Time    05/26/13  4:33 PM      Result Value Range   Vitamin B-12 540  211 - 911 pg/mL  FOLATE     Status: None   Collection Time    05/26/13  4:33 PM      Result Value Range   Folate 9.7    IRON AND TIBC     Status: Abnormal   Collection Time    05/26/13   4:33 PM      Result Value Range   Iron 19 (*) 42 - 135 ug/dL   TIBC 161  096 - 045 ug/dL   Saturation Ratios 5 (*) 20 - 55 %   UIBC 367  125 - 400 ug/dL  FERRITIN     Status: Abnormal   Collection Time    05/26/13  4:33 PM      Result Value Range   Ferritin 3 (*) 10 - 291 ng/mL  RETICULOCYTES     Status: Abnormal   Collection Time    05/26/13  4:33 PM      Result Value Range   Retic Ct Pct 1.0  0.4 - 3.1 %   RBC. 3.71 (*) 3.87 - 5.11 MIL/uL   Retic Count, Manual 37.1  19.0 - 186.0 K/uL  TROPONIN I     Status: None   Collection Time    05/26/13  4:34 PM      Result Value Range   Troponin I <0.30  <0.30 ng/mL  RPR     Status: None   Collection Time    05/26/13  4:35 PM      Result Value Range   RPR NON REACTIVE  NON REACTIVE  PROTIME-INR     Status: None   Collection Time    05/26/13  4:35 PM      Result Value Range   Prothrombin Time 13.9  11.6 - 15.2 seconds   INR 1.09  0.00 - 1.49  HIV ANTIBODY (ROUTINE TESTING)     Status: None   Collection Time    05/26/13  4:35 PM      Result Value Range   HIV NON REACTIVE  NON REACTIVE  MAGNESIUM     Status: None   Collection Time    05/26/13  4:35 PM      Result Value Range   Magnesium 1.8  1.5 - 2.5 mg/dL  CBC     Status: Abnormal   Collection Time    05/26/13 10:46 PM      Result Value Range   WBC 6.2  4.0 - 10.5 K/uL   RBC 4.30  3.87 - 5.11 MIL/uL   Hemoglobin 9.3 (*) 12.0 - 15.0 g/dL   HCT 40.9 (*) 81.1 - 91.4 %   MCV 69.8 (*) 78.0 -  100.0 fL   MCH 21.6 (*) 26.0 - 34.0 pg   MCHC 31.0  30.0 - 36.0 g/dL   RDW 16.1 (*) 09.6 - 04.5 %   Platelets 279  150 - 400 K/uL     Services Ordered on Discharge: Y = Yes; Blank = No PT:   OT:   RN:   Equipment:   Other:      Time Spent on Discharge: 35 min   Signed: Lorretta Harp, MD PGY 2, Internal Medicine Resident 05/27/2013, 10:27 AM

## 2013-05-27 NOTE — Progress Notes (Signed)
D/c instructions reviewed with pt. Copy of instructions given to pt. CM has pt's number to call her with appointment time for completion of orange card information. Pt d/c'd via ambulation with family, (steady gait, declined wheelchair).

## 2013-05-28 LAB — GC/CHLAMYDIA PROBE AMP
CT Probe RNA: NEGATIVE
GC Probe RNA: NEGATIVE

## 2013-05-28 LAB — HAPTOGLOBIN: Haptoglobin: 95 mg/dL (ref 45–215)

## 2013-05-29 NOTE — Discharge Summary (Signed)
  Date: 05/29/2013  Patient name: Melanie Hancock  Medical record number: [id number]  Date of birth: 07-20-1996   This patient has been seen and the plan of care was discussed with the house staff. Please see their note for complete details. I concur with their findings and plan.  Jonah Blue, DO, FACP [facility] 05/29/2013, 11:52 AM

## 2013-06-05 ENCOUNTER — Ambulatory Visit: Payer: Self-pay | Admitting: Internal Medicine

## 2013-06-05 NOTE — ED Provider Notes (Signed)
Medical screening examination/treatment/procedure(s) were performed by non-physician practitioner and as supervising physician I was immediately available for consultation/collaboration for visit on 05/26/13   Celene Kras, MD 06/05/13 1117

## 2013-06-08 ENCOUNTER — Ambulatory Visit: Payer: Medicaid Other | Attending: Internal Medicine

## 2013-06-22 ENCOUNTER — Inpatient Hospital Stay: Payer: Self-pay | Admitting: Internal Medicine

## 2013-11-13 ENCOUNTER — Encounter (HOSPITAL_COMMUNITY): Payer: Self-pay | Admitting: Emergency Medicine

## 2013-11-13 ENCOUNTER — Emergency Department (INDEPENDENT_AMBULATORY_CARE_PROVIDER_SITE_OTHER)
Admission: EM | Admit: 2013-11-13 | Discharge: 2013-11-13 | Disposition: A | Discharge status: Home / Self Care | Payer: Self-pay | Source: Home / Self Care | Attending: Family Medicine | Admitting: Family Medicine

## 2013-11-13 ENCOUNTER — Other Ambulatory Visit (HOSPITAL_COMMUNITY)
Admission: RE | Admit: 2013-11-13 | Discharge: 2013-11-13 | Disposition: A | Discharge status: Home / Self Care | Payer: Medicaid Other | Source: Ambulatory Visit | Attending: Family Medicine | Admitting: Family Medicine

## 2013-11-13 DIAGNOSIS — Z113 Encounter for screening for infections with a predominantly sexual mode of transmission: Secondary | ICD-10-CM | POA: Insufficient documentation

## 2013-11-13 DIAGNOSIS — N390 Urinary tract infection, site not specified: Secondary | ICD-10-CM

## 2013-11-13 DIAGNOSIS — N760 Acute vaginitis: Secondary | ICD-10-CM

## 2013-11-13 LAB — POCT PREGNANCY, URINE: Preg Test, Ur: NEGATIVE

## 2013-11-13 LAB — POCT URINALYSIS DIP (DEVICE)
Bilirubin Urine: NEGATIVE
Glucose, UA: NEGATIVE mg/dL
Ketones, ur: NEGATIVE mg/dL
Nitrite: NEGATIVE
Protein, ur: 100 mg/dL — AB
Specific Gravity, Urine: >=1.03 (ref 1.005–1.030)
Urobilinogen, UA: 1 mg/dL (ref 0.0–1.0)
pH: 7 (ref 5.0–8.0)

## 2013-11-13 MED ORDER — METRONIDAZOLE 500 MG PO TABS
500.0000 mg | ORAL_TABLET | Freq: Two times a day (BID) | ORAL | Status: DC
Start: 2013-11-13 — End: 2014-03-19

## 2013-11-13 MED ORDER — FLUCONAZOLE 150 MG PO TABS: 150.0000 mg | ORAL_TABLET | Freq: Once | ORAL | Status: DC

## 2013-11-13 MED ORDER — CEPHALEXIN 500 MG PO CAPS: 500.0000 mg | ORAL_CAPSULE | Freq: Two times a day (BID) | ORAL | Status: DC

## 2013-11-13 NOTE — ED Notes (Signed)
Pt's emergency contact number verified; her phone is not working at the moment.

## 2013-11-13 NOTE — Discharge Instructions (Signed)
Thank you for coming in today. Take Keflex, and metronidazole twice daily for one week. Take fluconazole once. If your belly pain worsens, or you have high fever, bad vomiting, blood in your stool or black tarry stool go to the Emergency Room.  Vaginitis Vaginitis is an inflammation of the vagina. It is most often caused by a change in the normal balance of the bacteria and yeast that live in the vagina. This change in balance causes an overgrowth of certain bacteria or yeast, which causes the inflammation. There are different types of vaginitis, but the most common types are:  Bacterial vaginosis.  Yeast infection (candidiasis).  Trichomoniasis vaginitis. This is a sexually transmitted infection (STI).  Viral vaginitis.  Atropic vaginitis.  Allergic vaginitis. CAUSES  The cause depends on the type of vaginitis. Vaginitis can be caused by:  Bacteria (bacterial vaginosis).  Yeast (yeast infection).  A parasite (trichomoniasis vaginitis)  A virus (viral vaginitis).  Low hormone levels (atrophic vaginitis). Low hormone levels can occur during pregnancy, breastfeeding, or after menopause.  Irritants, such as bubble baths, scented tampons, and feminine sprays (allergic vaginitis). Other factors can change the normal balance of the yeast and bacteria that live in the vagina. These include:  Antibiotic medicines.  Poor hygiene.  Diaphragms, vaginal sponges, spermicides, birth control pills, and intrauterine devices (IUD).  Sexual intercourse.  Infection.  Uncontrolled diabetes.  A weakened immune system. SYMPTOMS  Symptoms can vary depending on the cause of the vaginitis. Common symptoms include:  Abnormal vaginal discharge.  The discharge is white, gray, or yellow with bacterial vaginosis.  The discharge is thick, white, and cheesy with a yeast infection.  The discharge is frothy and yellow or greenish with trichomoniasis.  A bad vaginal odor.  The odor is fishy  with bacterial vaginosis.  Vaginal itching, pain, or swelling.  Painful intercourse.  Pain or burning when urinating. Sometimes, there are no symptoms. TREATMENT  Treatment will vary depending on the type of infection.   Bacterial vaginosis and trichomoniasis are often treated with antibiotic creams or pills.  Yeast infections are often treated with antifungal medicines, such as vaginal creams or suppositories.  Viral vaginitis has no cure, but symptoms can be treated with medicines that relieve discomfort. Your sexual partner should be treated as well.  Atrophic vaginitis may be treated with an estrogen cream, pill, suppository, or vaginal ring. If vaginal dryness occurs, lubricants and moisturizing creams may help. You may be told to avoid scented soaps, sprays, or douches.  Allergic vaginitis treatment involves quitting the use of the product that is causing the problem. Vaginal creams can be used to treat the symptoms. HOME CARE INSTRUCTIONS   Take all medicines as directed by your caregiver.  Keep your genital area clean and dry. Avoid soap and only rinse the area with water.  Avoid douching. It can remove the healthy bacteria in the vagina.  Do not use tampons or have sexual intercourse until your vaginitis has been treated. Use sanitary pads while you have vaginitis.  Wipe from front to back. This avoids the spread of bacteria from the rectum to the vagina.  Let air reach your genital area.  Wear cotton underwear to decrease moisture buildup.  Avoid wearing underwear while you sleep until your vaginitis is gone.  Avoid tight pants and underwear or nylons without a cotton panel.  Take off wet clothing (especially bathing suits) as soon as possible.  Use mild, non-scented products. Avoid using irritants, such as:  Scented feminine sprays.  Fabric softeners.  Scented detergents.  Scented tampons.  Scented soaps or bubble baths.  Practice safe sex and use  condoms. Condoms may prevent the spread of trichomoniasis and viral vaginitis. SEEK MEDICAL CARE IF:   You have abdominal pain.  You have a fever or persistent symptoms for more than 2 3 days.  You have a fever and your symptoms suddenly get worse. Document Released: 06/03/2007 Document Revised: 04/30/2012 Document Reviewed: 01/17/2012 [facility] CenterExitCare Patient Information 2014 [place], [place]. Urinary Tract Infection Urinary tract infections (UTIs) can develop anywhere along your urinary tract. Your urinary tract is your body's drainage system for removing wastes and extra water. Your urinary tract includes two kidneys, two ureters, a bladder, and a urethra. Your kidneys are a pair of bean-shaped organs. Each kidney is about the size of your fist. They are located below your ribs, one on each side of your spine. CAUSES Infections are caused by microbes, which are microscopic organisms, including fungi, viruses, and bacteria. These organisms are so small that they can only be seen through a microscope. Bacteria are the microbes that most commonly cause UTIs. SYMPTOMS  Symptoms of UTIs may vary by age and gender of the patient and by the location of the infection. Symptoms in young women typically include a frequent and intense urge to urinate and a painful, burning feeling in the bladder or urethra during urination. Older women and men are more likely to be tired, shaky, and weak and have muscle aches and abdominal pain. A fever may mean the infection is in your kidneys. Other symptoms of a kidney infection include pain in your back or sides below the ribs, nausea, and vomiting. DIAGNOSIS To diagnose a UTI, your caregiver will ask you about your symptoms. Your caregiver also will ask to provide a urine sample. The urine sample will be tested for bacteria and white blood cells. White blood cells are made by your body to help fight infection. TREATMENT  Typically, UTIs can be treated with medication. Because  most UTIs are caused by a bacterial infection, they usually can be treated with the use of antibiotics. The choice of antibiotic and length of treatment depend on your symptoms and the type of bacteria causing your infection. HOME CARE INSTRUCTIONS  If you were prescribed antibiotics, take them exactly as your caregiver instructs you. Finish the medication even if you feel better after you have only taken some of the medication.  Drink enough water and fluids to keep your urine clear or pale yellow.  Avoid caffeine, tea, and carbonated beverages. They tend to irritate your bladder.  Empty your bladder often. Avoid holding urine for long periods of time.  Empty your bladder before and after sexual intercourse.  After a bowel movement, women should cleanse from front to back. Use each tissue only once. SEEK MEDICAL CARE IF:   You have back pain.  You develop a fever.  Your symptoms do not begin to resolve within 3 days. SEEK IMMEDIATE MEDICAL CARE IF:   You have severe back pain or lower abdominal pain.  You develop chills.  You have nausea or vomiting.  You have continued burning or discomfort with urination. MAKE SURE YOU:   Understand these instructions.  Will watch your condition.  Will get help right away if you are not doing well or get worse. Document Released: 05/16/2005 Document Revised: 02/05/2012 Document Reviewed: 09/14/2011 [facility]Exit[facility] Patient Information 2014 [place]ExitCare, [place]LL[place].

## 2013-11-13 NOTE — ED Notes (Signed)
Pt c/o abd pain onset today  Sxs include: dysuria, urinary freq/urgency, hematuria  Also c/o vag d/c onset x1 month?? W/a "musty" smell?? Denies f/v/d Alert w/no signs of acute distress.

## 2013-11-13 NOTE — ED Provider Notes (Signed)
Melanie Hancock is a 18 y.o. female who presents to Urgent Care today for urinary frequency urgency and blood in the urine. This is been present for one day and is consistent with prior episodes of urinary tract infection. Patient also has 2 months of vaginal discharge. She denies any fevers chills nausea vomiting or diarrhea. She is very mild lower pelvic pain for a few days.   Past Medical History  Diagnosis Date  . Infection     UTI  . UTI (lower urinary tract infection)    History  Substance Use Topics  . Smoking status: Never Smoker   . Smokeless tobacco: Never Used  . Alcohol Use: No   ROS as above Medications: No current facility-administered medications for this encounter.   Current Outpatient Prescriptions  Medication Sig Dispense Refill  . cephALEXin (KEFLEX) 500 MG capsule Take 1 capsule (500 mg total) by mouth 2 (two) times daily.  14 capsule  0  . docusate sodium (COLACE) 100 MG capsule Take 1 capsule (100 mg total) by mouth 2 (two) times daily as needed for constipation.  60 capsule  3  . ferrous sulfate 325 (65 FE) MG tablet Take 1 tablet (325 mg total) by mouth 3 (three) times daily with meals.  90 tablet  3  . fluconazole (DIFLUCAN) 150 MG tablet Take 1 tablet (150 mg total) by mouth once.  1 tablet  1  . metroNIDAZOLE (FLAGYL) 500 MG tablet Take 1 tablet (500 mg total) by mouth 2 (two) times daily.  14 tablet  0  . phenazopyridine (PYRIDIUM) 200 MG tablet Take 1 tablet (200 mg total) by mouth once.  6 tablet  0    Exam:  BP 117/68  Pulse 91  Temp(Src) 98.5 F (36.9 C) (Oral)  Resp 18  SpO2 100%  LMP 10/21/2013 Gen: Well NAD HEENT: EOMI,  MMM Lungs: Normal work of breathing. CTABL Heart: RRR no MRG Abd: NABS, Soft. NT, ND no rebound or guarding Exts: Brisk capillary refill, warm and well perfused.  GYN: Normal external genitalia. Vaginal canal with thick white discharge. Normal cervix. No cervical motion tenderness or adnexal masses.   Results for orders  placed during the hospital encounter of 11/13/13 (from the past 24 hour(s))  POCT URINALYSIS DIP (DEVICE)     Status: Abnormal   Collection Time    11/13/13  3:25 PM      Result Value Ref Range   Glucose, UA NEGATIVE  NEGATIVE mg/dL   Bilirubin Urine NEGATIVE  NEGATIVE   Ketones, ur NEGATIVE  NEGATIVE mg/dL   Specific Gravity, Urine >=1.030  1.005 - 1.030   Hgb urine dipstick MODERATE (*) NEGATIVE   pH 7.0  5.0 - 8.0   Protein, ur 100 (*) NEGATIVE mg/dL   Urobilinogen, UA 1.0  0.0 - 1.0 mg/dL   Nitrite NEGATIVE  NEGATIVE   Leukocytes, UA LARGE (*) NEGATIVE  POCT PREGNANCY, URINE     Status: None   Collection Time    11/13/13  3:32 PM      Result Value Ref Range   Preg Test, Ur NEGATIVE  NEGATIVE   No results found.  Assessment and Plan: 18 y.o. female with  1) urinary tract infection. Plan to treat with Keflex.  2) vaginitis. Likely give her yeast. Plan to treat with metronidazole and fluconazole. Cytology pending.  Discussed warning signs or symptoms. Please see discharge instructions. Patient expresses understanding.    Rodolph BongEvan S Winda Summerall, MD 11/13/13 [phone]

## 2013-11-16 LAB — CERVICOVAGINAL ANCILLARY ONLY
Chlamydia: NEGATIVE
Neisseria Gonorrhea: NEGATIVE
Wet Prep (BD Affirm): NEGATIVE
Wet Prep (BD Affirm): NEGATIVE
Wet Prep (BD Affirm): POSITIVE — AB

## 2013-11-16 NOTE — ED Notes (Signed)
GC/Chlamydia neg., Affirm: Candida and Trich neg., Gardnerella pos.  Pt. adequately treated with Flagyl. Vassie MoselleYork, Darion Milewski M 11/16/2013

## 2014-03-19 ENCOUNTER — Emergency Department (HOSPITAL_COMMUNITY)
Admission: EM | Admit: 2014-03-19 | Discharge: 2014-03-19 | Disposition: A | Discharge status: Home / Self Care | Payer: Medicaid Other | Attending: Emergency Medicine | Admitting: Emergency Medicine

## 2014-03-19 ENCOUNTER — Encounter (HOSPITAL_COMMUNITY): Payer: Self-pay | Admitting: Emergency Medicine

## 2014-03-19 DIAGNOSIS — O99340 Other mental disorders complicating pregnancy, unspecified trimester: Secondary | ICD-10-CM | POA: Diagnosis not present

## 2014-03-19 DIAGNOSIS — F410 Panic disorder [episodic paroxysmal anxiety] without agoraphobia: Secondary | ICD-10-CM | POA: Diagnosis not present

## 2014-03-19 DIAGNOSIS — O2390 Unspecified genitourinary tract infection in pregnancy, unspecified trimester: Secondary | ICD-10-CM | POA: Insufficient documentation

## 2014-03-19 DIAGNOSIS — O99019 Anemia complicating pregnancy, unspecified trimester: Secondary | ICD-10-CM | POA: Insufficient documentation

## 2014-03-19 DIAGNOSIS — Z3490 Encounter for supervision of normal pregnancy, unspecified, unspecified trimester: Secondary | ICD-10-CM

## 2014-03-19 DIAGNOSIS — D649 Anemia, unspecified: Secondary | ICD-10-CM

## 2014-03-19 DIAGNOSIS — N760 Acute vaginitis: Secondary | ICD-10-CM | POA: Diagnosis not present

## 2014-03-19 DIAGNOSIS — B9689 Other specified bacterial agents as the cause of diseases classified elsewhere: Secondary | ICD-10-CM | POA: Insufficient documentation

## 2014-03-19 DIAGNOSIS — Z87440 Personal history of urinary (tract) infections: Secondary | ICD-10-CM | POA: Diagnosis not present

## 2014-03-19 DIAGNOSIS — A499 Bacterial infection, unspecified: Secondary | ICD-10-CM | POA: Diagnosis not present

## 2014-03-19 HISTORY — DX: Anemia, unspecified: D64.9

## 2014-03-19 HISTORY — DX: Anxiety disorder, unspecified: F41.9

## 2014-03-19 LAB — BASIC METABOLIC PANEL
Anion gap: 13 (ref 5–15)
BUN: 12 mg/dL (ref 6–23)
CO2: 20 mEq/L (ref 19–32)
Calcium: 9.7 mg/dL (ref 8.4–10.5)
Chloride: 105 mEq/L (ref 96–112)
Creatinine, Ser: 0.69 mg/dL (ref 0.50–1.10)
GFR calc Af Amer: >90 mL/min (ref >90)
GFR calc non Af Amer: >90 mL/min (ref >90)
Glucose, Bld: 86 mg/dL (ref 70–99)
Potassium: 3.6 mEq/L — ABNORMAL LOW (ref 3.7–5.3)
Sodium: 138 mEq/L (ref 137–147)

## 2014-03-19 LAB — CBC
HCT: 28.5 % — ABNORMAL LOW (ref 36.0–46.0)
Hemoglobin: 8.1 g/dL — ABNORMAL LOW (ref 12.0–15.0)
MCH: 17.7 pg — ABNORMAL LOW (ref 26.0–34.0)
MCHC: 28.4 g/dL — ABNORMAL LOW (ref 30.0–36.0)
MCV: 62.4 fL — ABNORMAL LOW (ref 78.0–100.0)
Platelets: 345 10*3/uL (ref 150–400)
RBC: 4.57 MIL/uL (ref 3.87–5.11)
RDW: 20.5 % — ABNORMAL HIGH (ref 11.5–15.5)
WBC: 4.8 10*3/uL (ref 4.0–10.5)

## 2014-03-19 LAB — WET PREP, GENITAL
Trich, Wet Prep: NONE SEEN
WBC, Wet Prep HPF POC: NONE SEEN
Yeast Wet Prep HPF POC: NONE SEEN

## 2014-03-19 LAB — RPR

## 2014-03-19 LAB — HIV ANTIBODY (ROUTINE TESTING W REFLEX): HIV 1&2 Ab, 4th Generation: NONREACTIVE

## 2014-03-19 LAB — POC URINE PREG, ED: Preg Test, Ur: POSITIVE — AB

## 2014-03-19 MED ORDER — METRONIDAZOLE 500 MG PO TABS: 500.0000 mg | ORAL_TABLET | Freq: Two times a day (BID) | ORAL | Status: DC

## 2014-03-19 NOTE — ED Provider Notes (Signed)
Medical screening examination/treatment/procedure(s) were performed by non-physician practitioner and as supervising physician I was immediately available for consultation/collaboration.   EKG Interpretation None        Gilda Creasehristopher J. Pollina, MD 03/19/14 [phone]

## 2014-03-19 NOTE — ED Provider Notes (Signed)
CSN: [id number]     Arrival date & time 03/19/14  1340 History   First MD Initiated Contact with Patient 03/19/14 1507     Chief Complaint  Patient presents with  . Anxiety  . Anemia     (Consider location/radiation/quality/duration/timing/severity/associated sxs/prior Treatment) HPI  Patient to the ER with complaints of recently having problems with anxiety, hot flashes, crying spells, low back pain and some discharge. She attempted to donate plasma 6 days ago but was told her hemoglobin is too low. She reports having hemoglobin between 6-7 at baseline, unknown cause. She stopped taking her iron supplements because they make her nauseous but just recently started to take them again. She requests a pregnancy test because last time she felt this way she was pregnant. She has one 20 year old son already and has had previous abortion. Denies CP, abdominal pain, SOB, lower extremity swelling, syncope, vaginal bleeding, dysuria. She reports feeling much better since being in the ED and all of her symptoms have completely resolved.  Past Medical History  Diagnosis Date  . Infection     UTI  . UTI (lower urinary tract infection)   . Anxiety   . Anemia    Past Surgical History  Procedure Laterality Date  . No past surgeries    . Induced abortion     History reviewed. No pertinent family history. History  Substance Use Topics  . Smoking status: Never Smoker   . Smokeless tobacco: Never Used  . Alcohol Use: No   OB History   Grav Para Term Preterm Abortions TAB SAB Ect Mult Living   2 1 1       1      Review of Systems  All other systems reviewed and are negative.     Allergies  Review of patient's allergies indicates no known allergies.  Home Medications   Prior to Admission medications   Medication Sig Start Date End Date Taking? Authorizing Provider  metroNIDAZOLE (FLAGYL) 500 MG tablet Take 1 tablet (500 mg total) by mouth 2 (two) times daily. 03/19/14   Melanie Behe Irine SealG Jillyn Stacey,  PA-C   BP 121/65  Pulse 82  Temp(Src) 98.8 F (37.1 C) (Oral)  Resp 16  Ht 5\' 6"  (1.676 m)  Wt 161 lb (73.029 kg)  BMI 26.00 kg/m2  SpO2 100% Physical Exam  Nursing note and vitals reviewed. Constitutional: She appears well-developed and well-nourished. No distress.  HENT:  Head: Normocephalic and atraumatic.  Eyes: Pupils are equal, round, and reactive to light.  Neck: Normal range of motion. Neck supple.  Cardiovascular: Normal rate and regular rhythm.   Pulmonary/Chest: Effort normal.  Abdominal: Soft. Bowel sounds are normal. There is tenderness in the suprapubic area. There is no rebound, no guarding and no CVA tenderness.  Genitourinary: There is no tenderness or lesion on the right labia. There is no tenderness or lesion on the left labia. Uterus is enlarged and tender. Cervix exhibits no motion tenderness and no discharge. Right adnexum displays no mass, no tenderness and no fullness. Left adnexum displays no mass, no tenderness and no fullness. Vaginal discharge found.  Neurological: She is alert.  Skin: Skin is warm and dry.  Psychiatric: Her mood appears anxious.    ED Course  Procedures (including critical care time) Labs Review Labs Reviewed  WET PREP, GENITAL - Abnormal; Notable for the following:    Clue Cells Wet Prep HPF POC FEW (*)    All other components within normal limits  BASIC METABOLIC PANEL -  Abnormal; Notable for the following:    Potassium 3.6 (*)    All other components within normal limits  CBC - Abnormal; Notable for the following:    Hemoglobin 8.1 (*)    HCT 28.5 (*)    MCV 62.4 (*)    MCH 17.7 (*)    MCHC 28.4 (*)    RDW 20.5 (*)    All other components within normal limits  POC URINE PREG, ED - Abnormal; Notable for the following:    Preg Test, Ur POSITIVE (*)    All other components within normal limits  GC/CHLAMYDIA PROBE AMP  HIV ANTIBODY (ROUTINE TESTING)  RPR    Imaging Review No results found.   EKG  Interpretation None      MDM   Final diagnoses:  Pregnant  Bacterial vaginosis  Anxiety attack  Anemia, unspecified anemia type   3:40 pm On arrival the patients pulse was 72, after telling her she is pregnant her pulse jumped to the 120's.  Her hemoglobin is 8.1, i see that her previous results are 9.2, 9.3, 6.6 (2014) , 6.5, 10. She has just now started to take her iron supplements again and denies having any source of bleeding.  She also says she can not take prenatal vitamins because they cause her throat to swell so she takes childrens [place] Vitamins. I have asked her to restart taking her iron and [place] vitamins.   Wet prep pending. Unremarkable pelvic exam. Patient feeling much better.  4: 26 pm Few clew cells, will treat with abx. She is going to start her [place] vitamins, continue iron, and follow-up with the [facility] doctor she is established with from her previous pregnancy.  18 y.o.Melanie Hancock's evaluation in the Emergency Department is complete. It has been determined that no acute conditions requiring further emergency intervention are present at this time. The patient/guardian have been advised of the diagnosis and plan. We have discussed signs and symptoms that warrant return to the ED, such as changes or worsening in symptoms.  Vital signs are stable at discharge. Filed Vitals:   03/19/14 1623  BP: 121/65  Pulse: 82  Temp:   Resp: 16    Patient/guardian has voiced understanding and agreed to follow-up with the PCP or specialist.   [name], PA-C 03/19/14 1627

## 2014-03-19 NOTE — ED Notes (Addendum)
Per EMS, Pt, from work, was found lying on the floor, crying and hyperventilating this afternoon.  Pt reports low back discomfort and "hot flashes" prior to episode.  Pt is currently calm.  Hx of anxiety.  EMS sts "every little thing caused her to start crying and hyperventilate prior to transport."    Pt reports that she tried to donate plasma x 6 days ago, but was told that her iron was too low.  Pt began taking iron pills again, attempted to give blood x 3 days ago, and was told that her iron was still too low.   Also, Pt would like to have a pregnancy test.

## 2014-03-19 NOTE — Discharge Instructions (Signed)
Bacterial Vaginosis Bacterial vaginosis is a vaginal infection that occurs when the normal balance of bacteria in the vagina is disrupted. It results from an overgrowth of certain bacteria. This is the most common vaginal infection in women of childbearing age. Treatment is important to prevent complications, especially in pregnant women, as it can cause a premature delivery. CAUSES  Bacterial vaginosis is caused by an increase in harmful bacteria that are normally present in smaller amounts in the vagina. Several different kinds of bacteria can cause bacterial vaginosis. However, the reason that the condition develops is not fully understood. RISK FACTORS Certain activities or behaviors can put you at an increased risk of developing bacterial vaginosis, including:  Having a new sex partner or multiple sex partners.  Douching.  Using an intrauterine device (IUD) for contraception. Women do not get bacterial vaginosis from toilet seats, bedding, swimming pools, or contact with objects around them. SIGNS AND SYMPTOMS  Some women with bacterial vaginosis have no signs or symptoms. Common symptoms include:  Grey vaginal discharge.  A fishlike odor with discharge, especially after sexual intercourse.  Itching or burning of the vagina and vulva.  Burning or pain with urination. DIAGNOSIS  Your health care provider will take a medical history and examine the vagina for signs of bacterial vaginosis. A sample of vaginal fluid may be taken. Your health care provider will look at this sample under a microscope to check for bacteria and abnormal cells. A vaginal pH test may also be done.  TREATMENT  Bacterial vaginosis may be treated with antibiotic medicines. These may be given in the form of a pill or a vaginal cream. A second round of antibiotics may be prescribed if the condition comes back after treatment.  HOME CARE INSTRUCTIONS   Only take over-the-counter or prescription medicines as  directed by your health care provider.  If antibiotic medicine was prescribed, take it as directed. Make sure you finish it even if you start to feel better.  Do not have sex until treatment is completed.  Tell all sexual partners that you have a vaginal infection. They should see their health care provider and be treated if they have problems, such as a mild rash or itching.  Practice safe sex by using condoms and only having one sex partner. SEEK MEDICAL CARE IF:   Your symptoms are not improving after 3 days of treatment.  You have increased discharge or pain.  You have a fever. MAKE SURE YOU:   Understand these instructions.  Will watch your condition.  Will get help right away if you are not doing well or get worse. FOR MORE INFORMATION  Centers for Disease Control and Prevention, Division of STD Prevention: SolutionApps.co.za American Sexual Health Association (ASHA): www.ashastd.org  Document Released: 08/06/2005 Document Revised: 05/27/2013 Document Reviewed: 03/18/2013 [facility] Patient Information 2015 [place], [place]. This information is not intended to replace advice given to you by your health care provider. Make sure you discuss any questions you have with your health care provider.  Pregnancy and Anemia Anemia is a condition in which the concentration of red blood cells or hemoglobin in the blood is below normal. Hemoglobin is a substance in red blood cells that carries oxygen to the tissues of the body. Anemia results in not enough oxygen reaching these tissues.  Anemia during pregnancy is common because the fetus uses more iron and folic acid as it is developing. Your body may not produce enough red blood cells because of this. Also, during pregnancy, the  liquid part of the blood (plasma) increases by about 50%, and the red blood cells increase by only 25%. This lowers the concentration of the red blood cells and creates a natural anemia-like situation.  CAUSES  The most  common cause of anemia during pregnancy is not having enough iron in the body to make red blood cells (iron deficiency anemia). Other causes may include:  Folic acid deficiency.  Vitamin B12 deficiency.  Certain prescription or over-the-counter medicines.  Certain medical conditions or infections that destroy red blood cells.  A low platelet count and bleeding caused by antibodies that go through the placenta to the fetus from the mother's blood. SIGNS AND SYMPTOMS  Mild anemia may not be noticeable. If it becomes severe, symptoms may include:  Tiredness.  Shortness of breath, especially with exercise.  Weakness.  Fainting.  Pale looking skin.  Headaches.  Feeling a fast or irregular heartbeat (palpitations). DIAGNOSIS  The type of anemia is usually diagnosed from your family and medical history and blood tests. TREATMENT  Treatment of anemia during pregnancy depends on the cause of the anemia. Treatment can include:  Supplements of iron, vitamin B12, or folic acid.  A blood transfusion. This may be needed if blood loss is severe.  Hospitalization. This may be needed if there is significant continual blood loss.  Dietary changes. HOME CARE INSTRUCTIONS   Follow your dietitian's or health care provider's dietary recommendations.  Increase your vitamin C intake. This will help the stomach absorb more iron.  Eat a diet rich in iron. This would include foods such as:  Liver.  Beef.  Whole grain bread.  Eggs.  Dried fruit.  Take iron and vitamins as directed by your health care provider.  Eat green leafy vegetables. These are a good source of folic acid. SEEK MEDICAL CARE IF:   You have frequent or lasting headaches.  You are looking pale.  You are bruising easily. SEEK IMMEDIATE MEDICAL CARE IF:   You have extreme weakness, shortness of breath, or chest pain.  You become dizzy or have trouble concentrating.  You have heavy vaginal bleeding.  You  develop a rash.  You have bloody or black, tarry stools.  You faint.  You vomit up blood.  You vomit repeatedly.  You have abdominal pain.  You have a fever or persistent symptoms for more than 2-3 days.  You have a fever and your symptoms suddenly get worse.  You are dehydrated. MAKE SURE YOU:   Understand these instructions.  Will watch your condition.  Will get help right away if you are not doing well or get worse. Document Released: 08/03/2000 Document Revised: 05/27/2013 Document Reviewed: 03/18/2013 [facility] Patient Information 2015 [place], [place]. This information is not intended to replace advice given to you by your health care provider. Make sure you discuss any questions you have with your health care provider.

## 2014-03-19 NOTE — ED Notes (Signed)
PA at bedside for pelvic exam.

## 2014-03-20 ENCOUNTER — Inpatient Hospital Stay (HOSPITAL_COMMUNITY)
Admission: AD | Admit: 2014-03-20 | Discharge: 2014-03-21 | Disposition: A | Discharge status: Home / Self Care | Payer: Medicaid Other | Source: Ambulatory Visit | Attending: Obstetrics and Gynecology | Admitting: Obstetrics and Gynecology

## 2014-03-20 ENCOUNTER — Encounter (HOSPITAL_COMMUNITY): Payer: Self-pay | Admitting: *Deleted

## 2014-03-20 DIAGNOSIS — O26891 Other specified pregnancy related conditions, first trimester: Secondary | ICD-10-CM

## 2014-03-20 DIAGNOSIS — R12 Heartburn: Secondary | ICD-10-CM | POA: Insufficient documentation

## 2014-03-20 DIAGNOSIS — G8929 Other chronic pain: Secondary | ICD-10-CM | POA: Insufficient documentation

## 2014-03-20 DIAGNOSIS — R110 Nausea: Secondary | ICD-10-CM

## 2014-03-20 DIAGNOSIS — O99891 Other specified diseases and conditions complicating pregnancy: Secondary | ICD-10-CM

## 2014-03-20 DIAGNOSIS — O210 Mild hyperemesis gravidarum: Secondary | ICD-10-CM | POA: Insufficient documentation

## 2014-03-20 DIAGNOSIS — M549 Dorsalgia, unspecified: Secondary | ICD-10-CM | POA: Insufficient documentation

## 2014-03-20 DIAGNOSIS — O9989 Other specified diseases and conditions complicating pregnancy, childbirth and the puerperium: Principal | ICD-10-CM

## 2014-03-20 DIAGNOSIS — R079 Chest pain, unspecified: Secondary | ICD-10-CM | POA: Insufficient documentation

## 2014-03-20 LAB — GC/CHLAMYDIA PROBE AMP
CT Probe RNA: NEGATIVE
GC Probe RNA: NEGATIVE

## 2014-03-20 NOTE — MAU Note (Addendum)
Some SOB, back pain and chest pain since this am. [facility] and [facility] told me to follow up here. Told yest has BV but has not picked up med yet

## 2014-03-21 ENCOUNTER — Encounter (HOSPITAL_COMMUNITY): Payer: Self-pay | Admitting: *Deleted

## 2014-03-21 DIAGNOSIS — R12 Heartburn: Secondary | ICD-10-CM | POA: Diagnosis not present

## 2014-03-21 DIAGNOSIS — M549 Dorsalgia, unspecified: Secondary | ICD-10-CM | POA: Diagnosis not present

## 2014-03-21 DIAGNOSIS — O210 Mild hyperemesis gravidarum: Secondary | ICD-10-CM | POA: Diagnosis not present

## 2014-03-21 DIAGNOSIS — O99891 Other specified diseases and conditions complicating pregnancy: Secondary | ICD-10-CM | POA: Diagnosis not present

## 2014-03-21 DIAGNOSIS — G8929 Other chronic pain: Secondary | ICD-10-CM | POA: Diagnosis present

## 2014-03-21 DIAGNOSIS — R079 Chest pain, unspecified: Secondary | ICD-10-CM | POA: Diagnosis not present

## 2014-03-21 DIAGNOSIS — R110 Nausea: Secondary | ICD-10-CM

## 2014-03-21 LAB — URINALYSIS, ROUTINE W REFLEX MICROSCOPIC
Bilirubin Urine: NEGATIVE
Glucose, UA: NEGATIVE mg/dL
Hgb urine dipstick: NEGATIVE
Ketones, ur: NEGATIVE mg/dL
Leukocytes, UA: NEGATIVE
Nitrite: NEGATIVE
Protein, ur: NEGATIVE mg/dL
Specific Gravity, Urine: 1.01 (ref 1.005–1.030)
Urobilinogen, UA: 1 mg/dL (ref 0.0–1.0)
pH: 7 (ref 5.0–8.0)

## 2014-03-21 MED ORDER — FAMOTIDINE 20 MG PO TABS: 20.0000 mg | ORAL_TABLET | Freq: Two times a day (BID) | ORAL | Status: DC

## 2014-03-21 MED ORDER — FAMOTIDINE 20 MG PO TABS
20.0000 mg | ORAL_TABLET | Freq: Once | ORAL | Status: AC
  Administered 2014-03-21: 20 mg via ORAL
  Filled 2014-03-21: qty 1

## 2014-03-21 MED ORDER — PROMETHAZINE HCL 12.5 MG PO TABS: 12.5000 mg | ORAL_TABLET | Freq: Four times a day (QID) | ORAL | Status: DC | PRN

## 2014-03-21 MED ORDER — CYCLOBENZAPRINE HCL 10 MG PO TABS: 10.0000 mg | ORAL_TABLET | Freq: Two times a day (BID) | ORAL | Status: DC | PRN

## 2014-03-21 NOTE — Discharge Instructions (Signed)
Heartburn During Pregnancy  °Heartburn is a burning sensation in the chest caused by stomach acid backing up into the esophagus. Heartburn is common in pregnancy because a certain hormone (progesterone) is released when a woman is pregnant. The progesterone hormone may relax the valve that separates the esophagus from the stomach. This allows acid to go up into the esophagus, causing heartburn. Heartburn may also happen in pregnancy because the enlarging uterus pushes up on the stomach, which pushes more acid into the esophagus. This is especially true in the later stages of pregnancy. Heartburn problems usually go away after giving birth. °CAUSES  °Heartburn is caused by stomach acid backing up into the esophagus. During pregnancy, this may result from various things, including:  °· The progesterone hormone. °· Changing hormone levels. °· The growing uterus pushing stomach acid upward. °· Large meals. °· Certain foods and drinks. °· Exercise. °· Increased acid production. °SIGNS AND SYMPTOMS  °· Burning pain in the chest or lower throat. °· Bitter taste in the mouth. °· Coughing. °DIAGNOSIS  °Your health care provider will typically diagnose heartburn by taking a careful history of your concern. Blood tests may be done to check for a certain type of bacteria that is associated with heartburn. Sometimes, heartburn is diagnosed by prescribing a heartburn medicine to see if the symptoms improve. In some cases, a procedure called an endoscopy may be done. In this procedure, a tube with a light and a camera on the end (endoscope) is used to examine the esophagus and the stomach. °TREATMENT  °Treatment will vary depending on the severity of your symptoms. Your health care provider may recommend: °· Over-the-counter medicines (antacids, acid reducers) for mild heartburn. °· Prescription medicines to decrease stomach acid or to protect your stomach lining. °· Certain changes in your diet. °· Elevating the head of your bed  by putting blocks under the legs. This helps prevent stomach acid from backing up into the esophagus when you are lying down. °HOME CARE INSTRUCTIONS  °· Only take over-the-counter or prescription medicines as directed by your health care provider. °· Raise the head of your bed by putting blocks under the legs if instructed to do so by your health care provider. Sleeping with more pillows is not effective because it only changes the position of your head. °· Do not exercise right after eating. °· Avoid eating 2-3 hours before bed. Do not lie down right after eating. °· Eat small meals throughout the day instead of three large meals. °· Identify foods and beverages that make your symptoms worse and avoid them. Foods you may want to avoid include: °¨ Peppers. °¨ Chocolate. °¨ High-fat foods, including fried foods. °¨ Spicy foods. °¨ Garlic and onions. °¨ Citrus fruits, including oranges, grapefruit, lemons, and limes. °¨ Food containing tomatoes or tomato products. °¨ Mint. °¨ Carbonated and caffeinated drinks. °¨ Vinegar. °SEEK MEDICAL CARE IF: °· You have abdominal pain of any kind. °· You feel burning in your upper abdomen or chest, especially after eating or lying down. °· You have nausea and vomiting. °· Your stomach feels upset after you eat. °SEEK IMMEDIATE MEDICAL CARE IF:  °· You have severe chest pain that goes down your arm or into your jaw or neck. °· You feel sweaty, dizzy, or light-headed. °· You become short of breath. °· You vomit blood. °· You have difficulty or pain with swallowing. °· You have bloody or black, tarry stools. °· You have episodes of heartburn more than 3 times a   week, for more than 2 weeks. MAKE SURE YOU:  Understand these instructions.  Will watch your condition.  Will get help right away if you are not doing well or get worse. Document Released: 08/03/2000 Document Revised: 08/11/2013 Document Reviewed: 03/25/2013 [facility] Patient Information 2015 [place], [place]. This  information is not intended to replace advice given to you by your health care provider. Make sure you discuss any questions you have with your health care provider. First Trimester of Pregnancy The first trimester of pregnancy is from week 1 until the end of week 12 (months 1 through 3). During this time, your baby will begin to develop inside you. At 6-8 weeks, the eyes and face are formed, and the heartbeat can be seen on ultrasound. At the end of 12 weeks, all the baby's organs are formed. Prenatal care is all the medical care you receive before the birth of your baby. Make sure you get good prenatal care and follow all of your doctor's instructions. HOME CARE  Medicines  Take medicine only as told by your doctor. Some medicines are safe and some are not during pregnancy.  Take your prenatal vitamins as told by your doctor.  Take medicine that helps you poop (stool softener) as needed if your doctor says it is okay. Diet  Eat regular, healthy meals.  Your doctor will tell you the amount of weight gain that is right for you.  Avoid raw meat and uncooked cheese.  If you feel sick to your stomach (nauseous) or throw up (vomit):  Eat 4 or 5 small meals a day instead of 3 large meals.  Try eating a few soda crackers.  Drink liquids between meals instead of during meals.  If you have a hard time pooping (constipation):  Eat high-fiber foods like fresh vegetables, fruit, and whole grains.  Drink enough fluids to keep your pee (urine) clear or pale yellow. Activity and Exercise  Exercise only as told by your doctor. Stop exercising if you have cramps or pain in your lower belly (abdomen) or low back.  Try to avoid standing for long periods of time. Move your legs often if you must stand in one place for a long time.  Avoid heavy lifting.  Wear low-heeled shoes. Sit and stand up straight.  You can have sex unless your doctor tells you not to. Relief of Pain or Discomfort  Wear  a good support bra if your breasts are sore.  Take warm water baths (sitz baths) to soothe pain or discomfort caused by hemorrhoids. Use hemorrhoid cream if your doctor says it is okay.  Rest with your legs raised if you have leg cramps or low back pain.  Wear support hose if you have puffy, bulging veins (varicose veins) in your legs. Raise (elevate) your feet for 15 minutes, 3-4 times a day. Limit salt in your diet. Prenatal Care  Schedule your prenatal visits by the twelfth week of pregnancy.  Write down your questions. Take them to your prenatal visits.  Keep all your prenatal visits as told by your doctor. Safety  Wear your seat belt at all times when driving.  Make a list of emergency phone numbers. The list should include numbers for family, friends, the hospital, and police and fire departments. General Tips  Ask your doctor for a referral to a local prenatal class. Begin classes no later than at the start of month 6 of your pregnancy.  Ask for help if you need counseling or help with nutrition.  Your doctor can give you advice or tell you where to go for help.  Do not use hot tubs, steam rooms, or saunas.  Do not douche or use tampons or scented sanitary pads.  Do not cross your legs for long periods of time.  Avoid litter boxes and soil used by cats.  Avoid all smoking, herbs, and alcohol. Avoid drugs not approved by your doctor.  Visit your dentist. At home, brush your teeth with a soft toothbrush. Be gentle when you floss. GET HELP IF:  You are dizzy.  You have mild cramps or pressure in your lower belly.  You have a nagging pain in your belly area.  You continue to feel sick to your stomach, throw up, or have watery poop (diarrhea).  You have a bad smelling fluid coming from your vagina.  You have pain with peeing (urination).  You have increased puffiness (swelling) in your face, hands, legs, or ankles. GET HELP RIGHT AWAY IF:   You have a  fever.  You are leaking fluid from your vagina.  You have spotting or bleeding from your vagina.  You have very bad belly cramping or pain.  You gain or lose weight rapidly.  You throw up blood. It may look like coffee grounds.  You are around people who have [place] measles, fifth disease, or chickenpox.  You have a very bad headache.  You have shortness of breath.  You have any kind of trauma, such as from a fall or a car accident. Document Released: 01/23/2008 Document Revised: 12/21/2013 Document Reviewed: 06/16/2013 [facility] Patient Information 2015 [place], [place]. This information is not intended to replace advice given to you by your health care provider. Make sure you discuss any questions you have with your health care provider.

## 2014-03-21 NOTE — Progress Notes (Signed)
Julie Ethier PA in earlier to discuss test results and d/c plan. Written and verbal d/c instructions given and understanding voiced. 

## 2014-03-21 NOTE — MAU Provider Note (Signed)
History     CSN: [id number]  Arrival date and time: 03/20/14 2335   First Provider Initiated Contact with Patient 03/21/14 0113      Chief Complaint  Patient presents with  . Chest Pain  . Back Pain  . Shortness of Breath   HPI [name] is a 18 y.o. G3P1011 at 5591w6d who presents to MAU today with complaint of chronic back pain and chest pain. [facility]. She states that she plans to return to [facility] for prenatal care. She states back pain x weeks and was taking mom's muscle relaxer with some relief. She has stopped taking that since finding out she was pregnant. She states frequent spasms of the mid thoracic to lumbar region. She also states SOB and chest pain today. She had an anxiety attack in [facility] yesterday. She also states sore throat and upper mid chest pain today. She complains of associated epigastric pain. She denies lower abdominal pain, vaginal bleeding, UTI symptoms or fever.   OB History   Grav Para Term Preterm Abortions TAB SAB Ect Mult Living   3 1 1  1 1    1       Past Medical History  Diagnosis Date  . Infection     UTI  . UTI (lower urinary tract infection)   . Anxiety   . Anemia     Past Surgical History  Procedure Laterality Date  . No past surgeries    . Induced abortion      Family History  Problem Relation Age of Onset  . Alcohol abuse Neg Hx     History  Substance Use Topics  . Smoking status: Never Smoker   . Smokeless tobacco: Never Used  . Alcohol Use: No    Allergies: No Known Allergies  Prescriptions prior to admission  Medication Sig Dispense Refill  . Prenatal Vit-Fe Fumarate-FA (PRENATAL MULTIVITAMIN) TABS tablet Take 1 tablet by mouth daily at 12 noon.      . metroNIDAZOLE (FLAGYL) 500 MG tablet Take 1 tablet (500 mg total) by mouth 2 (two) times daily.  14 tablet  0    Review of Systems  Constitutional: Negative for fever and malaise/fatigue.  Gastrointestinal: Positive for  nausea and abdominal pain. Negative for vomiting, diarrhea and constipation.  Genitourinary: Negative for dysuria, urgency, frequency, hematuria and flank pain.       Neg - vaginal bleeding, discharge  Musculoskeletal: Positive for back pain.   Physical Exam   Blood pressure 120/45, pulse 58, resp. rate 18, height 5\' 6"  (1.676 m), weight 158 lb 6.4 oz (71.85 kg), last menstrual period 02/15/2014, SpO2 100.00%, unknown if currently breastfeeding.  Physical Exam  Constitutional: She is oriented to person, place, and time. She appears well-developed and well-nourished. No distress.  HENT:  Head: Normocephalic and atraumatic.  Cardiovascular: Normal rate.   Respiratory: Effort normal.  GI: Soft. She exhibits no distension and no mass. There is tenderness (mild tenderness to palpation of the epigastric region just below the xyphoid process). There is no rebound, no guarding and no CVA tenderness.  Neurological: She is alert and oriented to person, place, and time.  Skin: Skin is warm and dry. No erythema.  Psychiatric: She has a normal mood and affect.   Results for orders placed during the hospital encounter of 03/20/14 (from the past 24 hour(s))  URINALYSIS, ROUTINE W REFLEX MICROSCOPIC     Status: None   Collection Time  03/21/14  1:30 AM      Result Value Ref Range   Color, Urine YELLOW  YELLOW   APPearance CLEAR  CLEAR   Specific Gravity, Urine 1.010  1.005 - 1.030   pH 7.0  5.0 - 8.0   Glucose, UA NEGATIVE  NEGATIVE mg/dL   Hgb urine dipstick NEGATIVE  NEGATIVE   Bilirubin Urine NEGATIVE  NEGATIVE   Ketones, ur NEGATIVE  NEGATIVE mg/dL   Protein, ur NEGATIVE  NEGATIVE mg/dL   Urobilinogen, UA 1.0  0.0 - 1.0 mg/dL   Nitrite NEGATIVE  NEGATIVE   Leukocytes, UA NEGATIVE  NEGATIVE    MAU Course  Procedures None  MDM UA today shows no signs of infection Pepcid given in MAU. Patient reports some improvement in pain EKG normal sinus rhythm  Assessment and Plan   A: Heartburn in pregnancy Back pain in pregnancy [place]  P: Discharge home Rx for Phenergan, Pepcid and Flexeril given Pregnancy confirmation letter given. Plans to got to [facility] for prenatal care, advised to call ASAP First trimester warning signs discussed Patient may return to MAU as needed or if her condition were to change or worsen   Freddi Starr, PA-C  03/21/2014, 2:35 AM

## 2014-03-23 NOTE — MAU Provider Note (Signed)
Attestation of Attending Supervision of Advanced Practitioner: Evaluation and management procedures were performed by the PA/NP/CNM/OB Fellow under my supervision/collaboration. Chart reviewed and agree with management and plan.  Chryl Holten V 03/23/2014 7:29 AM

## 2014-03-28 ENCOUNTER — Inpatient Hospital Stay (HOSPITAL_COMMUNITY)
Admission: AD | Admit: 2014-03-28 | Discharge: 2014-03-28 | Disposition: A | Discharge status: Home / Self Care | Payer: Medicaid Other | Source: Ambulatory Visit | Attending: Obstetrics and Gynecology | Admitting: Obstetrics and Gynecology

## 2014-03-28 ENCOUNTER — Encounter (HOSPITAL_COMMUNITY): Payer: Self-pay | Admitting: *Deleted

## 2014-03-28 ENCOUNTER — Inpatient Hospital Stay (HOSPITAL_COMMUNITY): Payer: Medicaid Other

## 2014-03-28 DIAGNOSIS — D649 Anemia, unspecified: Secondary | ICD-10-CM | POA: Insufficient documentation

## 2014-03-28 DIAGNOSIS — G8929 Other chronic pain: Secondary | ICD-10-CM | POA: Diagnosis present

## 2014-03-28 DIAGNOSIS — R109 Unspecified abdominal pain: Secondary | ICD-10-CM | POA: Insufficient documentation

## 2014-03-28 DIAGNOSIS — F419 Anxiety disorder, unspecified: Secondary | ICD-10-CM | POA: Diagnosis not present

## 2014-03-28 DIAGNOSIS — O99019 Anemia complicating pregnancy, unspecified trimester: Secondary | ICD-10-CM | POA: Insufficient documentation

## 2014-03-28 DIAGNOSIS — M549 Dorsalgia, unspecified: Secondary | ICD-10-CM | POA: Diagnosis not present

## 2014-03-28 LAB — CBC
HCT: 27.6 % — ABNORMAL LOW (ref 36.0–46.0)
Hemoglobin: 8.1 g/dL — ABNORMAL LOW (ref 12.0–15.0)
MCH: 18.9 pg — ABNORMAL LOW (ref 26.0–34.0)
MCHC: 29.3 g/dL — ABNORMAL LOW (ref 30.0–36.0)
MCV: 64.3 fL — ABNORMAL LOW (ref 78.0–100.0)
Platelets: 371 10*3/uL (ref 150–400)
RBC: 4.29 MIL/uL (ref 3.87–5.11)
RDW: 24.9 % — ABNORMAL HIGH (ref 11.5–15.5)
WBC: 6.1 10*3/uL (ref 4.0–10.5)

## 2014-03-28 LAB — URINALYSIS, ROUTINE W REFLEX MICROSCOPIC
Bilirubin Urine: NEGATIVE
Glucose, UA: NEGATIVE mg/dL
Hgb urine dipstick: NEGATIVE
Ketones, ur: NEGATIVE mg/dL
Leukocytes, UA: NEGATIVE
Nitrite: NEGATIVE
Protein, ur: 100 mg/dL — AB
Specific Gravity, Urine: 1.025 (ref 1.005–1.030)
Urobilinogen, UA: 0.2 mg/dL (ref 0.0–1.0)
pH: 6 (ref 5.0–8.0)

## 2014-03-28 LAB — HCG, QUANTITATIVE, PREGNANCY: hCG, Beta Chain, Quant, S: 15378 m[IU]/mL — ABNORMAL HIGH (ref <5)

## 2014-03-28 LAB — URINE MICROSCOPIC-ADD ON

## 2014-03-28 NOTE — Discharge Instructions (Signed)
Pregnancy and Anemia Anemia is a condition in which the concentration of red blood cells or hemoglobin in the blood is below normal. Hemoglobin is a substance in red blood cells that carries oxygen to the tissues of the body. Anemia results in not enough oxygen reaching these tissues.  Anemia during pregnancy is common because the fetus uses more iron and folic acid as it is developing. Your body may not produce enough red blood cells because of this. Also, during pregnancy, the liquid part of the blood (plasma) increases by about 50%, and the red blood cells increase by only 25%. This lowers the concentration of the red blood cells and creates a natural anemia-like situation.  CAUSES  The most common cause of anemia during pregnancy is not having enough iron in the body to make red blood cells (iron deficiency anemia). Other causes may include:  Folic acid deficiency.  Vitamin B12 deficiency.  Certain prescription or over-the-counter medicines.  Certain medical conditions or infections that destroy red blood cells.  A low platelet count and bleeding caused by antibodies that go through the placenta to the fetus from the mother's blood. SIGNS AND SYMPTOMS  Mild anemia may not be noticeable. If it becomes severe, symptoms may include:  Tiredness.  Shortness of breath, especially with exercise.  Weakness.  Fainting.  Pale looking skin.  Headaches.  Feeling a fast or irregular heartbeat (palpitations). DIAGNOSIS  The type of anemia is usually diagnosed from your family and medical history and blood tests. TREATMENT  Treatment of anemia during pregnancy depends on the cause of the anemia. Treatment can include:  Supplements of iron, vitamin B12, or folic acid.  A blood transfusion. This may be needed if blood loss is severe.  Hospitalization. This may be needed if there is significant continual blood loss.  Dietary changes. HOME CARE INSTRUCTIONS   Follow your dietitian's or  health care provider's dietary recommendations.  Increase your vitamin C intake. This will help the stomach absorb more iron.  Eat a diet rich in iron. This would include foods such as:  Liver.  Beef.  Whole grain bread.  Eggs.  Dried fruit.  Take iron and vitamins as directed by your health care provider.  Eat green leafy vegetables. These are a good source of folic acid. SEEK MEDICAL CARE IF:   You have frequent or lasting headaches.  You are looking pale.  You are bruising easily. SEEK IMMEDIATE MEDICAL CARE IF:   You have extreme weakness, shortness of breath, or chest pain.  You become dizzy or have trouble concentrating.  You have heavy vaginal bleeding.  You develop a rash.  You have bloody or black, tarry stools.  You faint.  You vomit up blood.  You vomit repeatedly.  You have abdominal pain.  You have a fever or persistent symptoms for more than 2-3 days.  You have a fever and your symptoms suddenly get worse.  You are dehydrated. MAKE SURE YOU:   Understand these instructions.  Will watch your condition.  Will get help right away if you are not doing well or get worse. Document Released: 08/03/2000 Document Revised: 05/27/2013 Document Reviewed: 03/18/2013 [facility] Patient Information 2015 [place], [place]. This information is not intended to replace advice given to you by your health care provider. Make sure you discuss any questions you have with your health care provider.  Abdominal Pain During Pregnancy Abdominal pain is common in pregnancy. Most of the time, it does not cause harm. There are many causes  of abdominal pain. Some causes are more serious than others. Some of the causes of abdominal pain in pregnancy are easily diagnosed. Occasionally, the diagnosis takes time to understand. Other times, the cause is not determined. Abdominal pain can be a sign that something is very wrong with the pregnancy, or the pain may have nothing to do  with the pregnancy at all. For this reason, always tell your health care provider if you have any abdominal discomfort. HOME CARE INSTRUCTIONS  Monitor your abdominal pain for any changes. The following actions may help to alleviate any discomfort you are experiencing:  Do not have sexual intercourse or put anything in your vagina until your symptoms go away completely.  Get plenty of rest until your pain improves.  Drink clear fluids if you feel nauseous. Avoid solid food as long as you are uncomfortable or nauseous.  Only take over-the-counter or prescription medicine as directed by your health care provider.  Keep all follow-up appointments with your health care provider. SEEK IMMEDIATE MEDICAL CARE IF:  You are bleeding, leaking fluid, or passing tissue from the vagina.  You have increasing pain or cramping.  You have persistent vomiting.  You have painful or bloody urination.  You have a fever.  You have extreme weakness or feel faint.  You have shortness of breath, with or without abdominal pain.  You develop a severe headache with abdominal pain.  You have abnormal vaginal discharge with abdominal pain.  You have persistent diarrhea.  You have abdominal pain that continues even after rest, or gets worse. MAKE SURE YOU:   Understand these instructions.  Will watch your condition.  Will get help right away if you are not doing well or get worse. Document Released: 08/06/2005 Document Revised: 05/27/2013 Document Reviewed: 03/05/2013 [facility] Patient Information 2015 [place]. This information is not intended to replace advice given to you by your health care provider. Make sure you discuss any questions you have with your health care provider.

## 2014-03-28 NOTE — MAU Note (Signed)
Pt states she woke up @ 0600 & was having L mid & lower abd pain, back pain x 2 days.  Denies bleeding.

## 2014-03-28 NOTE — MAU Provider Note (Signed)
History   18 yo G4P1021 at 5-[redacted] weeks gestation by uncertain LMP presented unannounced c/o cramping and back pain.  Has had hx of chronic back pain.  Denies bleeding.  Currently on tx for BV from ER visit on 03/19/14.  Also has struggled with nausea---on Phenergan from MAU visit 03/21/14.    Seen 7/31 at ER for anxiety, hot flashes, crying spells, low back pain, chest pain, d/c.  Had normal EKG, dx with BV, with MTZ Rx'd.  Hgb 8.1--patient has started Fe supplement daily.  Had "anxiety attack" in ER per subsequent MAU notes, but no reflection of that in ER notes, other than an increased pulse rate after dx of pregnancy.  Hgb 8.1, hct 28.5.  GC, chlamydia negative.   Seen 03/21/14 in MAU for chronic back pain and chest pain.  Dx with reflux, nausea, and back pain.  Rx'd Phenergan, Pepcid, and Flexeril.  Neg UA  Previous patient of [facility] with 2011 pregnancy--has NOB interview scheduled this week "if insurance gets settled".  Dx with pyelonephritis and severe anemia in 2014 (hx menorrhagia), hospitalized for ATB and received blood transfusions.   Patient Active Problem List   Diagnosis Date Noted  . Anxiety 03/28/2014  . Chronic back pain 03/28/2014  . Anemia 05/26/2013  . Hx pyelonephritis 2014 05/26/2013    Chief Complaint  Patient presents with  . Abdominal Pain  . Back Pain   HPI  OB History   Grav Para Term Preterm Abortions TAB SAB Ect Mult Living   4 1 1  2 1 1   1       Past Medical History  Diagnosis Date  . Infection     UTI  . UTI (lower urinary tract infection)   . Anxiety   . Anemia     Past Surgical History  Procedure Laterality Date  . No past surgeries    . Induced abortion      Family History  Problem Relation Age of Onset  . Alcohol abuse Neg Hx     History  Substance Use Topics  . Smoking status: Never Smoker   . Smokeless tobacco: Never Used  . Alcohol Use: No    Allergies: No Known Allergies  Prescriptions prior to admission  Medication Sig  Dispense Refill  . cyclobenzaprine (FLEXERIL) 10 MG tablet Take 1 tablet (10 mg total) by mouth 2 (two) times daily as needed for muscle spasms.  20 tablet  0  . famotidine (PEPCID) 20 MG tablet Take 1 tablet (20 mg total) by mouth 2 (two) times daily.  30 tablet  0  . metroNIDAZOLE (FLAGYL) 500 MG tablet Take 1 tablet (500 mg total) by mouth 2 (two) times daily.  14 tablet  0  . Prenatal Vit-Fe Fumarate-FA (PRENATAL MULTIVITAMIN) TABS tablet Take 1 tablet by mouth daily at 12 noon.        ROS:  Cramping, back pain. Physical Exam   Blood pressure 107/61, pulse 85, temperature 98.3 F (36.8 C), temperature source Oral, resp. rate 16, last menstrual period 02/15/2014, unknown if currently breastfeeding.  Physical Exam In NAD Chest clear  Heart RRR without murmur Abd soft, no rebound or guarding, mild tenderness to palpation in LUQ Pelvic--no d/c in vault, uterus small, mild tenderness in lower pelvis,  left > right, adnexa without masses, NT.  Cervix closed, NT. Ext WNL  ED Course  Assessment: Early pregnancy Hx anemia BV--on treatment  Plan: QHCG CBC [place] if appropriate per [facility] results.   Nigel Bridgeman CNM,  MSN 03/28/2014 8:40 AM  Addendum:  Results for orders placed during the hospital encounter of 03/28/14 (from the past 24 hour(s))  URINALYSIS, ROUTINE W REFLEX MICROSCOPIC     Status: Abnormal   Collection Time    03/28/14  7:25 AM      Result Value Ref Range   Color, Urine YELLOW  YELLOW   APPearance CLEAR  CLEAR   Specific Gravity, Urine 1.025  1.005 - 1.030   pH 6.0  5.0 - 8.0   Glucose, UA NEGATIVE  NEGATIVE mg/dL   Hgb urine dipstick NEGATIVE  NEGATIVE   Bilirubin Urine NEGATIVE  NEGATIVE   Ketones, ur NEGATIVE  NEGATIVE mg/dL   Protein, ur 161100 (*) NEGATIVE mg/dL   Urobilinogen, UA 0.2  0.0 - 1.0 mg/dL   Nitrite NEGATIVE  NEGATIVE   Leukocytes, UA NEGATIVE  NEGATIVE  URINE MICROSCOPIC-ADD ON     Status: Abnormal   Collection Time    03/28/14  7:25 AM       Result Value Ref Range   Squamous Epithelial / LPF FEW (*) RARE   WBC, UA 0-2  <3 WBC/hpf   RBC / HPF 0-2  <3 RBC/hpf   Bacteria, UA FEW (*) RARE   Urine-Other MUCOUS PRESENT    HCG, QUANTITATIVE, PREGNANCY     Status: Abnormal   Collection Time    03/28/14  8:23 AM      Result Value Ref Range   hCG, Beta Chain, Quant, S 15378 (*) <5 mIU/mL  CBC     Status: Abnormal   Collection Time    03/28/14  8:23 AM      Result Value Ref Range   WBC 6.1  4.0 - 10.5 K/uL   RBC 4.29  3.87 - 5.11 MIL/uL   Hemoglobin 8.1 (*) 12.0 - 15.0 g/dL   HCT 09.627.6 (*) 04.536.0 - 40.946.0 %   MCV 64.3 (*) 78.0 - 100.0 fL   MCH 18.9 (*) 26.0 - 34.0 pg   MCHC 29.3 (*) 30.0 - 36.0 g/dL   RDW 81.124.9 (*) 91.411.5 - 78.215.5 %   Platelets 371  150 - 400 K/uL   US[facility]  [facility]red[facility], FHR 95, right CLC--report recommends f/u US for viability.  Impression: Early pregnancy Anemia--on Fe  Plan: Repeat US on 8/13 for viability. Note to office to schedule. Continue Fe.  Nigel BridgemanVicki Otniel Hoe, CNM 03/28/14 1200

## 2014-03-30 ENCOUNTER — Telehealth: Payer: Self-pay | Admitting: Internal Medicine

## 2014-03-30 NOTE — Telephone Encounter (Signed)
Pt was referred by [facility] because pt is a [facility] pt and [facility] is listed on card. Called pt to schedule appt and LVM.

## 2014-04-06 ENCOUNTER — Encounter (HOSPITAL_COMMUNITY): Payer: Self-pay | Admitting: *Deleted

## 2014-04-06 ENCOUNTER — Inpatient Hospital Stay (HOSPITAL_COMMUNITY)
Admission: AD | Admit: 2014-04-06 | Discharge: 2014-04-06 | Disposition: A | Discharge status: Home / Self Care | Payer: Medicaid Other | Source: Ambulatory Visit | Attending: Obstetrics & Gynecology | Admitting: Obstetrics & Gynecology

## 2014-04-06 DIAGNOSIS — O210 Mild hyperemesis gravidarum: Secondary | ICD-10-CM | POA: Insufficient documentation

## 2014-04-06 LAB — URINALYSIS, ROUTINE W REFLEX MICROSCOPIC
Bilirubin Urine: NEGATIVE
Glucose, UA: NEGATIVE mg/dL
Hgb urine dipstick: NEGATIVE
Ketones, ur: NEGATIVE mg/dL
Leukocytes, UA: NEGATIVE
Nitrite: NEGATIVE
Protein, ur: NEGATIVE mg/dL
Specific Gravity, Urine: 1.01 (ref 1.005–1.030)
Urobilinogen, UA: 0.2 mg/dL (ref 0.0–1.0)
pH: 8.5 — ABNORMAL HIGH (ref 5.0–8.0)

## 2014-04-06 LAB — COMPREHENSIVE METABOLIC PANEL
ALT: 11 U/L (ref 0–35)
AST: 14 U/L (ref 0–37)
Albumin: 4 g/dL (ref 3.5–5.2)
Alkaline Phosphatase: 51 U/L (ref 39–117)
Anion gap: 11 (ref 5–15)
BUN: 7 mg/dL (ref 6–23)
CO2: 24 mEq/L (ref 19–32)
Calcium: 9.2 mg/dL (ref 8.4–10.5)
Chloride: 101 mEq/L (ref 96–112)
Creatinine, Ser: 0.64 mg/dL (ref 0.50–1.10)
GFR calc Af Amer: >90 mL/min (ref >90)
GFR calc non Af Amer: >90 mL/min (ref >90)
Glucose, Bld: 80 mg/dL (ref 70–99)
Potassium: 3.7 mEq/L (ref 3.7–5.3)
Sodium: 136 mEq/L — ABNORMAL LOW (ref 137–147)
Total Bilirubin: 0.3 mg/dL (ref 0.3–1.2)
Total Protein: 7.9 g/dL (ref 6.0–8.3)

## 2014-04-06 MED ORDER — MECLIZINE HCL 25 MG PO TABS
25.0000 mg | ORAL_TABLET | Freq: Once | ORAL | Status: AC
  Administered 2014-04-06: 25 mg via ORAL
  Filled 2014-04-06: qty 1

## 2014-04-06 MED ORDER — PROMETHAZINE HCL 25 MG PO TABS: 25.0000 mg | ORAL_TABLET | Freq: Four times a day (QID) | ORAL | Status: DC | PRN

## 2014-04-06 MED ORDER — MECLIZINE HCL 25 MG PO TABS: 25.0000 mg | ORAL_TABLET | Freq: Three times a day (TID) | ORAL | Status: DC | PRN

## 2014-04-06 MED ORDER — METOCLOPRAMIDE HCL 10 MG PO TABS: 10.0000 mg | ORAL_TABLET | Freq: Four times a day (QID) | ORAL | Status: DC

## 2014-04-06 NOTE — MAU Provider Note (Signed)
History     CSN: [id number]  Arrival date and time: 04/06/14 1254   First Provider Initiated Contact with Patient 04/06/14 1435      Chief Complaint  Patient presents with  . Emesis During Pregnancy   HPIpt is 7684w1d pregnant [id number] who presents by EMS with nausea and vomiting.  Pt was recently seen on 03/28/2014 With abd cramping and back pain-pt had confirmed viable IUP on 03/28/2014 @6weeks .  Pt denies abd pain, cramping,vaginal discharge or bleeding today.  Pt says she has not been able to eat anything today. Rn note: Patient states she has been vomiting for about 2 days and not able to keep anything down. Ate prior to arrival by EMS and has not vomited that. Denies pain, bleeding or discharge.       Past Medical History  Diagnosis Date  . Infection     UTI  . UTI (lower urinary tract infection)   . Anxiety   . Anemia     Past Surgical History  Procedure Laterality Date  . Induced abortion      Family History  Problem Relation Age of Onset  . Alcohol abuse Neg Hx     History  Substance Use Topics  . Smoking status: Never Smoker   . Smokeless tobacco: Never Used  . Alcohol Use: No    Allergies: No Known Allergies  Prescriptions prior to admission  Medication Sig Dispense Refill  . cyclobenzaprine (FLEXERIL) 10 MG tablet Take 1 tablet (10 mg total) by mouth 2 (two) times daily as needed for muscle spasms.  20 tablet  0  . famotidine (PEPCID) 20 MG tablet Take 1 tablet (20 mg total) by mouth 2 (two) times daily.  30 tablet  0  . metroNIDAZOLE (FLAGYL) 500 MG tablet Take 1 tablet (500 mg total) by mouth 2 (two) times daily.  14 tablet  0  . Prenatal Vit-Fe Fumarate-FA (PRENATAL MULTIVITAMIN) TABS tablet Take 1 tablet by mouth daily at 12 noon.        Review of Systems  Constitutional: Negative for fever and chills.  Gastrointestinal: Positive for nausea and vomiting. Negative for abdominal pain, diarrhea and constipation.  Genitourinary: Negative for dysuria.   Neurological: Positive for dizziness.   Physical Exam   Blood pressure 115/61, pulse 64, temperature 98.9 F (37.2 C), temperature source Oral, resp. rate 16, height 5\' 7"  (1.702 m), weight 156 lb 6.4 oz (70.943 kg), last menstrual period 02/15/2014, SpO2 100.00%, unknown if currently breastfeeding.  Physical Exam  Nursing note and vitals reviewed. Constitutional: She is oriented to person, place, and time. She appears well-developed and well-nourished. No distress.  HENT:  Head: Normocephalic.  Eyes: Pupils are equal, round, and reactive to light.  Neck: Normal range of motion. Neck supple.  Cardiovascular: Normal rate.   Respiratory: Effort normal.  GI: Soft.  Musculoskeletal: Normal range of motion.  Neurological: She is alert and oriented to person, place, and time.  Skin: Skin is warm and dry.  Psychiatric: She has a normal mood and affect.    MAU Course  Procedures  Results for orders placed during the hospital encounter of 04/06/14 (from the past 24 hour(s))  URINALYSIS, ROUTINE W REFLEX MICROSCOPIC     Status: Abnormal   Collection Time    04/06/14  1:30 PM      Result Value Ref Range   Color, Urine YELLOW  YELLOW   APPearance CLEAR  CLEAR   Specific Gravity, Urine 1.010  1.005 - 1.030  pH 8.5 (*) 5.0 - 8.0   Glucose, UA NEGATIVE  NEGATIVE mg/dL   Hgb urine dipstick NEGATIVE  NEGATIVE   Bilirubin Urine NEGATIVE  NEGATIVE   Ketones, ur NEGATIVE  NEGATIVE mg/dL   Protein, ur NEGATIVE  NEGATIVE mg/dL   Urobilinogen, UA 0.2  0.0 - 1.0 mg/dL   Nitrite NEGATIVE  NEGATIVE   Leukocytes, UA NEGATIVE  NEGATIVE  pt has been able to tolerate crackers and ginger ale Results for orders placed during the hospital encounter of 04/06/14 (from the past 24 hour(s))  URINALYSIS, ROUTINE W REFLEX MICROSCOPIC     Status: Abnormal   Collection Time    04/06/14  1:30 PM      Result Value Ref Range   Color, Urine YELLOW  YELLOW   APPearance CLEAR  CLEAR   Specific Gravity, Urine  1.010  1.005 - 1.030   pH 8.5 (*) 5.0 - 8.0   Glucose, UA NEGATIVE  NEGATIVE mg/dL   Hgb urine dipstick NEGATIVE  NEGATIVE   Bilirubin Urine NEGATIVE  NEGATIVE   Ketones, ur NEGATIVE  NEGATIVE mg/dL   Protein, ur NEGATIVE  NEGATIVE mg/dL   Urobilinogen, UA 0.2  0.0 - 1.0 mg/dL   Nitrite NEGATIVE  NEGATIVE   Leukocytes, UA NEGATIVE  NEGATIVE  COMPREHENSIVE METABOLIC PANEL     Status: Abnormal   Collection Time    04/06/14  3:18 PM      Result Value Ref Range   Sodium 136 (*) 137 - 147 mEq/L   Potassium 3.7  3.7 - 5.3 mEq/L   Chloride 101  96 - 112 mEq/L   CO2 24  19 - 32 mEq/L   Glucose, Bld 80  70 - 99 mg/dL   BUN 7  6 - 23 mg/dL   Creatinine, Ser 1.61  0.50 - 1.10 mg/dL   Calcium 9.2  8.4 - 09.6 mg/dL   Total Protein 7.9  6.0 - 8.3 g/dL   Albumin 4.0  3.5 - 5.2 g/dL   AST 14  0 - 37 U/L   ALT 11  0 - 35 U/L   Alkaline Phosphatase 51  39 - 117 U/L   Total Bilirubin 0.3  0.3 - 1.2 mg/dL   GFR calc non Af Amer >90  >90 mL/min   GFR calc Af Amer >90  >90 mL/min   Anion gap 11  5 - 15   Assessment and Plan  Morning sickness RX for Reglan, phenergan and antivert  F/u with [facility] or provder of choice/  LINEBERRY,SUSAN 04/06/2014, 2:36 PM

## 2014-04-06 NOTE — MAU Note (Signed)
Patient states she has been vomiting for about 2 days and not able to keep anything down. Ate prior to arrival by EMS and has not vomited that. Denies pain, bleeding or discharge.

## 2014-04-27 LAB — OB RESULTS CONSOLE ABO/RH: RH Type: POSITIVE

## 2014-04-27 LAB — OB RESULTS CONSOLE ANTIBODY SCREEN: Antibody Screen: NEGATIVE

## 2014-04-27 LAB — OB RESULTS CONSOLE RUBELLA ANTIBODY, IGM: Rubella: IMMUNE

## 2014-04-27 LAB — OB RESULTS CONSOLE RPR: RPR: NONREACTIVE

## 2014-04-27 LAB — OB RESULTS CONSOLE HEPATITIS B SURFACE ANTIGEN: Hepatitis B Surface Ag: NEGATIVE

## 2014-04-28 ENCOUNTER — Ambulatory Visit (HOSPITAL_COMMUNITY)
Admission: RE | Admit: 2014-04-28 | Discharge: 2014-04-28 | Disposition: A | Discharge status: Home / Self Care | Payer: Medicaid Other | Source: Ambulatory Visit | Attending: Certified Nurse Midwife | Admitting: Certified Nurse Midwife

## 2014-04-28 ENCOUNTER — Other Ambulatory Visit (HOSPITAL_COMMUNITY): Payer: Self-pay | Admitting: Certified Nurse Midwife

## 2014-04-28 DIAGNOSIS — IMO0002 Reserved for concepts with insufficient information to code with codable children: Secondary | ICD-10-CM

## 2014-04-28 DIAGNOSIS — Z3689 Encounter for other specified antenatal screening: Secondary | ICD-10-CM | POA: Insufficient documentation

## 2014-04-28 DIAGNOSIS — O368390 Maternal care for abnormalities of the fetal heart rate or rhythm, unspecified trimester, not applicable or unspecified: Secondary | ICD-10-CM | POA: Diagnosis not present

## 2014-05-02 ENCOUNTER — Inpatient Hospital Stay (HOSPITAL_COMMUNITY)
Admission: AD | Admit: 2014-05-02 | Discharge: 2014-05-03 | Disposition: A | Discharge status: Home / Self Care | Payer: Medicaid Other | Source: Ambulatory Visit | Attending: Obstetrics and Gynecology | Admitting: Obstetrics and Gynecology

## 2014-05-02 DIAGNOSIS — O9989 Other specified diseases and conditions complicating pregnancy, childbirth and the puerperium: Principal | ICD-10-CM

## 2014-05-02 DIAGNOSIS — K3189 Other diseases of stomach and duodenum: Secondary | ICD-10-CM | POA: Insufficient documentation

## 2014-05-02 DIAGNOSIS — R109 Unspecified abdominal pain: Secondary | ICD-10-CM | POA: Insufficient documentation

## 2014-05-02 DIAGNOSIS — A499 Bacterial infection, unspecified: Secondary | ICD-10-CM | POA: Insufficient documentation

## 2014-05-02 DIAGNOSIS — B9689 Other specified bacterial agents as the cause of diseases classified elsewhere: Secondary | ICD-10-CM | POA: Insufficient documentation

## 2014-05-02 DIAGNOSIS — R1013 Epigastric pain: Secondary | ICD-10-CM

## 2014-05-02 DIAGNOSIS — O210 Mild hyperemesis gravidarum: Secondary | ICD-10-CM | POA: Insufficient documentation

## 2014-05-02 DIAGNOSIS — N760 Acute vaginitis: Secondary | ICD-10-CM | POA: Insufficient documentation

## 2014-05-02 DIAGNOSIS — O2390 Unspecified genitourinary tract infection in pregnancy, unspecified trimester: Secondary | ICD-10-CM | POA: Insufficient documentation

## 2014-05-02 DIAGNOSIS — O99891 Other specified diseases and conditions complicating pregnancy: Secondary | ICD-10-CM | POA: Insufficient documentation

## 2014-05-03 ENCOUNTER — Encounter (HOSPITAL_COMMUNITY): Payer: Self-pay | Admitting: *Deleted

## 2014-05-03 DIAGNOSIS — B9689 Other specified bacterial agents as the cause of diseases classified elsewhere: Secondary | ICD-10-CM | POA: Diagnosis not present

## 2014-05-03 DIAGNOSIS — A499 Bacterial infection, unspecified: Secondary | ICD-10-CM | POA: Diagnosis not present

## 2014-05-03 DIAGNOSIS — O99891 Other specified diseases and conditions complicating pregnancy: Secondary | ICD-10-CM | POA: Diagnosis present

## 2014-05-03 DIAGNOSIS — K3189 Other diseases of stomach and duodenum: Secondary | ICD-10-CM | POA: Diagnosis not present

## 2014-05-03 DIAGNOSIS — O2390 Unspecified genitourinary tract infection in pregnancy, unspecified trimester: Secondary | ICD-10-CM | POA: Diagnosis not present

## 2014-05-03 DIAGNOSIS — O210 Mild hyperemesis gravidarum: Secondary | ICD-10-CM | POA: Diagnosis not present

## 2014-05-03 DIAGNOSIS — N760 Acute vaginitis: Secondary | ICD-10-CM | POA: Diagnosis not present

## 2014-05-03 DIAGNOSIS — R109 Unspecified abdominal pain: Secondary | ICD-10-CM | POA: Diagnosis not present

## 2014-05-03 LAB — URINALYSIS, ROUTINE W REFLEX MICROSCOPIC
Bilirubin Urine: NEGATIVE
Glucose, UA: NEGATIVE mg/dL
Hgb urine dipstick: NEGATIVE
Ketones, ur: NEGATIVE mg/dL
Leukocytes, UA: NEGATIVE
Nitrite: NEGATIVE
Protein, ur: NEGATIVE mg/dL
Specific Gravity, Urine: 1.025 (ref 1.005–1.030)
Urobilinogen, UA: 0.2 mg/dL (ref 0.0–1.0)
pH: 6 (ref 5.0–8.0)

## 2014-05-03 MED ORDER — ACETAMINOPHEN 325 MG PO TABS
650.0000 mg | ORAL_TABLET | Freq: Once | ORAL | Status: AC
  Administered 2014-05-03: 650 mg via ORAL
  Filled 2014-05-03: qty 2

## 2014-05-03 MED ORDER — ONDANSETRON 8 MG PO TBDP
8.0000 mg | ORAL_TABLET | Freq: Once | ORAL | Status: AC
  Administered 2014-05-03: 8 mg via ORAL
  Filled 2014-05-03: qty 1

## 2014-05-03 NOTE — MAU Note (Signed)
Pt c/o low abd pain and pressure along with headaches for the last two days, denies any vag bleeding, discharge or dysuria.

## 2014-05-03 NOTE — MAU Provider Note (Signed)
History    Patient is a 18 y.o. [id number] at 11wks who presents with c/o stomach pain.  Reports stomach pain has been ongoing for 3 days and is located on right side of her abdomen.  Patient describes pain as a dull, but sharp constant pain and has taken no medication to help relieve pain.  Patient also states pain is unbearable and making it hard to walk.  Patient reporting some issues with urination as well including hesitancy and retention, but no pain.  Patient denies issues with constipation and admits to anxiety.  Patient denies vb and also reports some bouts of chest pain, which is most likely anxiety related.   Patient Active Problem List   Diagnosis Date Noted  . Anxiety 03/28/2014  . Chronic back pain 03/28/2014  . Anemia 05/26/2013  . Hx pyelonephritis 2014 05/26/2013    No chief complaint on file.  HPI  OB History   Grav Para Term Preterm Abortions TAB SAB Ect Mult Living   Past Medical History  Diagnosis Date  . Infection     UTI  . UTI (lower urinary tract infection)   . Anxiety   . Anemia     Past Surgical History  Procedure Laterality Date  . Induced abortion      Family History  Problem Relation Age of Onset  . Alcohol abuse Neg Hx     History  Substance Use Topics  . Smoking status: Never Smoker   . Smokeless tobacco: Never Used  . Alcohol Use: No    Allergies: No Known Allergies  Prescriptions prior to admission  Medication Sig Dispense Refill  . cyclobenzaprine (FLEXERIL) 10 MG tablet Take 1 tablet (10 mg total) by mouth 2 (two) times daily as needed for muscle spasms.  20 tablet  0  . famotidine (PEPCID) 20 MG tablet Take 1 tablet (20 mg total) by mouth 2 (two) times daily.  30 tablet  0  . meclizine (ANTIVERT) 25 MG tablet Take 1 tablet (25 mg total) by mouth 3 (three) times daily as needed for dizziness.  30 tablet  0  . metoCLOPramide (REGLAN) 10 MG tablet Take 1 tablet (10 mg total) by mouth every 6 (six) hours.  30  tablet  0  . Prenatal Vit-Fe Fumarate-FA (PRENATAL MULTIVITAMIN) TABS tablet Take 1 tablet by mouth daily at 12 noon.      . promethazine (PHENERGAN) 25 MG tablet Take 1 tablet (25 mg total) by mouth every 6 (six) hours as needed for nausea or vomiting.  30 tablet  0    ROS  See HPI Above Physical Exam   Height  (1.676 m), weight 152 lb 6.4 oz (69.128 kg), last menstrual period 02/15/2014, unknown if currently breastfeeding.  Results for orders placed during the hospital encounter of 05/02/14 (from the past 24 hour(s))  URINALYSIS, ROUTINE W REFLEX MICROSCOPIC     Status: None   Collection Time    05/03/14 12:03 AM      Result Value Ref Range   Color, Urine YELLOW  YELLOW   APPearance CLEAR  CLEAR   Specific Gravity, Urine 1.025  1.005 - 1.030   pH 6.0  5.0 - 8.0   Glucose, UA NEGATIVE  NEGATIVE mg/dL   Hgb urine dipstick NEGATIVE  NEGATIVE   Bilirubin Urine NEGATIVE  NEGATIVE   Ketones, ur NEGATIVE  NEGATIVE mg/dL   Protein, ur NEGATIVE  NEGATIVE  mg/dL   Urobilinogen, UA 0.2  0.0 - 1.0 mg/dL   Nitrite NEGATIVE  NEGATIVE   Leukocytes, UA NEGATIVE  NEGATIVE    Physical Exam  Constitutional: She is oriented to person, place, and time. She appears well-developed and well-nourished.  Cardiovascular: Normal rate, regular rhythm and normal heart sounds.   Respiratory: Effort normal and breath sounds normal.  GI: Soft. Bowel sounds are normal. She exhibits no distension. There is tenderness in the right upper quadrant and right lower quadrant. There is no rigidity, no rebound, no guarding, no CVA tenderness and negative Murphy's sign.  Genitourinary: Cervix exhibits no motion tenderness and no friability.  Sterile Speculum Exam: -Vaginal Vault: Pink unable to identify vaginal discharge due to recent usage of metrogel -Cervix: Appears closed-GC/CT collected  Bimanual Exam: Closed/Long/Thick   Neurological: She is alert and oriented to person, place, and time.  Skin: Skin is  warm and dry.   Patient with known right side luteum cysts that was stable on 9/9 ultrasound ED Course  Assessment: IUP at 11wks Abdominal Pain BV Nausea  Plan: -Labs: UA, Gc/CT -Zofran ODT  -Tylenol  -Discussed PE, pain mgmt, and precautions -Discussed anxiety mgmt -Keep appt as scheduled: 05/27/2014 -Call if you have any questions or concerns prior to your next visit.   Leoma Folds LYNN CNM, MSN 05/03/2014 12:15 AM

## 2014-05-03 NOTE — Discharge Instructions (Signed)
First Trimester of Pregnancy °The first trimester of pregnancy is from week 1 until the end of week 12 (months 1 through 3). A week after a sperm fertilizes an egg, the egg will implant on the wall of the uterus. This embryo will begin to develop into a baby. Genes from you and your partner are forming the baby. The female genes determine whether the baby is a boy or a girl. At 6-8 weeks, the eyes and face are formed, and the heartbeat can be seen on ultrasound. At the end of 12 weeks, all the baby's organs are formed.  °Now that you are pregnant, you will want to do everything you can to have a healthy baby. Two of the most important things are to get good prenatal care and to follow your health care provider's instructions. Prenatal care is all the medical care you receive before the baby's birth. This care will help prevent, find, and treat any problems during the pregnancy and childbirth. °BODY CHANGES °Your body goes through many changes during pregnancy. The changes vary from woman to woman.  °· You may gain or lose a couple of pounds at first. °· You may feel sick to your stomach (nauseous) and throw up (vomit). If the vomiting is uncontrollable, call your health care provider. °· You may tire easily. °· You may develop headaches that can be relieved by medicines approved by your health care provider. °· You may urinate more often. Painful urination may mean you have a bladder infection. °· You may develop heartburn as a result of your pregnancy. °· You may develop constipation because certain hormones are causing the muscles that push waste through your intestines to slow down. °· You may develop hemorrhoids or swollen, bulging veins (varicose veins). °· Your breasts may begin to grow larger and become tender. Your nipples may stick out more, and the tissue that surrounds them (areola) may become darker. °· Your gums may bleed and may be sensitive to brushing and flossing. °· Dark spots or blotches (chloasma,  mask of pregnancy) may develop on your face. This will likely fade after the baby is born. °· Your menstrual periods will stop. °· You may have a loss of appetite. °· You may develop cravings for certain kinds of food. °· You may have changes in your emotions from day to day, such as being excited to be pregnant or being concerned that something may go wrong with the pregnancy and baby. °· You may have more vivid and strange dreams. °· You may have changes in your hair. These can include thickening of your hair, rapid growth, and changes in texture. Some women also have hair loss during or after pregnancy, or hair that feels dry or thin. Your hair will most likely return to normal after your baby is born. °WHAT TO EXPECT AT YOUR PRENATAL VISITS °During a routine prenatal visit: °· You will be weighed to make sure you and the baby are growing normally. °· Your blood pressure will be taken. °· Your abdomen will be measured to track your baby's growth. °· The fetal heartbeat will be listened to starting around week 10 or 12 of your pregnancy. °· Test results from any previous visits will be discussed. °Your health care provider may ask you: °· How you are feeling. °· If you are feeling the baby move. °· If you have had any abnormal symptoms, such as leaking fluid, bleeding, severe headaches, or abdominal cramping. °· If you have any questions. °Other tests   that may be performed during your first trimester include: °· Blood tests to find your blood type and to check for the presence of any previous infections. They will also be used to check for low iron levels (anemia) and Rh antibodies. Later in the pregnancy, blood tests for diabetes will be done along with other tests if problems develop. °· Urine tests to check for infections, diabetes, or protein in the urine. °· An ultrasound to confirm the proper growth and development of the baby. °· An amniocentesis to check for possible genetic problems. °· Fetal screens for  spina bifida and Down syndrome. °· You may need other tests to make sure you and the baby are doing well. °HOME CARE INSTRUCTIONS  °Medicines °· Follow your health care provider's instructions regarding medicine use. Specific medicines may be either safe or unsafe to take during pregnancy. °· Take your prenatal vitamins as directed. °· If you develop constipation, try taking a stool softener if your health care provider approves. °Diet °· Eat regular, well-balanced meals. Choose a variety of foods, such as meat or vegetable-based protein, fish, milk and low-fat dairy products, vegetables, fruits, and whole grain breads and cereals. Your health care provider will help you determine the amount of weight gain that is right for you. °· Avoid raw meat and uncooked cheese. These carry germs that can cause birth defects in the baby. °· Eating four or five small meals rather than three large meals a day may help relieve nausea and vomiting. If you start to feel nauseous, eating a few soda crackers can be helpful. Drinking liquids between meals instead of during meals also seems to help nausea and vomiting. °· If you develop constipation, eat more high-fiber foods, such as fresh vegetables or fruit and whole grains. Drink enough fluids to keep your urine clear or pale yellow. °Activity and Exercise °· Exercise only as directed by your health care provider. Exercising will help you: °¨ Control your weight. °¨ Stay in shape. °¨ Be prepared for labor and delivery. °· Experiencing pain or cramping in the lower abdomen or low back is a good sign that you should stop exercising. Check with your health care provider before continuing normal exercises. °· Try to avoid standing for long periods of time. Move your legs often if you must stand in one place for a long time. °· Avoid heavy lifting. °· Wear low-heeled shoes, and practice good posture. °· You may continue to have sex unless your health care provider directs you  otherwise. °Relief of Pain or Discomfort °· Wear a good support bra for breast tenderness.   °· Take warm sitz baths to soothe any pain or discomfort caused by hemorrhoids. Use hemorrhoid cream if your health care provider approves.   °· Rest with your legs elevated if you have leg cramps or low back pain. °· If you develop varicose veins in your legs, wear support hose. Elevate your feet for 15 minutes, 3-4 times a day. Limit salt in your diet. °Prenatal Care °· Schedule your prenatal visits by the twelfth week of pregnancy. They are usually scheduled monthly at first, then more often in the last 2 months before delivery. °· Write down your questions. Take them to your prenatal visits. °· Keep all your prenatal visits as directed by your health care provider. °Safety °· Wear your seat belt at all times when driving. °· Make a list of emergency phone numbers, including numbers for family, friends, the hospital, and police and fire departments. °General Tips °·   Ask your health care provider for a referral to a local prenatal education class. Begin classes no later than at the beginning of month 6 of your pregnancy.  Ask for help if you have counseling or nutritional needs during pregnancy. Your health care provider can offer advice or refer you to specialists for help with various needs.  Do not use hot tubs, steam rooms, or saunas.  Do not douche or use tampons or scented sanitary pads.  Do not cross your legs for long periods of time.  Avoid cat litter boxes and soil used by cats. These carry germs that can cause birth defects in the baby and possibly loss of the fetus by miscarriage or stillbirth.  Avoid all smoking, herbs, alcohol, and medicines not prescribed by your health care provider. Chemicals in these affect the formation and growth of the baby.  Schedule a dentist appointment. At home, brush your teeth with a soft toothbrush and be gentle when you floss. SEEK MEDICAL CARE IF:   You have  dizziness.  You have mild pelvic cramps, pelvic pressure, or nagging pain in the abdominal area.  You have persistent nausea, vomiting, or diarrhea.  You have a bad smelling vaginal discharge.  You have pain with urination.  You notice increased swelling in your face, hands, legs, or ankles. SEEK IMMEDIATE MEDICAL CARE IF:   You have a fever.  You are leaking fluid from your vagina.  You have spotting or bleeding from your vagina.  You have severe abdominal cramping or pain.  You have rapid weight gain or loss.  You vomit blood or material that looks like coffee grounds.  You are exposed to [place] measles and have never had them.  You are exposed to fifth disease or chickenpox.  You develop a severe headache.  You have shortness of breath.  You have any kind of trauma, such as from a fall or a car accident. Document Released: 07/31/2001 Document Revised: 12/21/2013 Document Reviewed: 06/16/2013 [facility] Patient Information 2015 [place], [place]. This information is not intended to replace advice given to you by your health care provider. Make sure you discuss any questions you have with your health care provider. Panic Attacks Panic attacks are sudden, short-livedsurges of severe anxiety, fear, or discomfort. They may occur for no reason when you are relaxed, when you are anxious, or when you are sleeping. Panic attacks may occur for a number of reasons:   Healthy people occasionally have panic attacks in extreme, life-threatening situations, such as war or natural disasters. Normal anxiety is a protective mechanism of the body that helps [place] react to danger (fight or flight response).  Panic attacks are often seen with anxiety disorders, such as panic disorder, social anxiety disorder, generalized anxiety disorder, and phobias. Anxiety disorders cause excessive or uncontrollable anxiety. They may interfere with your relationships or other life activities.  Panic attacks are  sometimes seen with other mental illnesses, such as depression and posttraumatic stress disorder.  Certain medical conditions, prescription medicines, and drugs of abuse can cause panic attacks. SYMPTOMS  Panic attacks start suddenly, peak within 20 minutes, and are accompanied by four or more of the following symptoms:  Pounding heart or fast heart rate (palpitations).  Sweating.  Trembling or shaking.  Shortness of breath or feeling smothered.  Feeling choked.  Chest pain or discomfort.  Nausea or strange feeling in your stomach.  Dizziness, light-headedness, or feeling like you will faint.  Chills or hot flushes.  Numbness or tingling in your  lips or hands and feet.  Feeling that things are not real or feeling that you are not yourself.  Fear of losing control or going crazy.  Fear of dying. Some of these symptoms can mimic serious medical conditions. For example, you may think you are having a heart attack. Although panic attacks can be very scary, they are not life threatening. DIAGNOSIS  Panic attacks are diagnosed through an assessment by your health care provider. Your health care provider will ask questions about your symptoms, such as where and when they occurred. Your health care provider will also ask about your medical history and use of alcohol and drugs, including prescription medicines. Your health care provider may order blood tests or other studies to rule out a serious medical condition. Your health care provider may refer you to a mental health professional for further evaluation. TREATMENT   Most healthy people who have one or two panic attacks in an extreme, life-threatening situation will not require treatment.  The treatment for panic attacks associated with anxiety disorders or other mental illness typically involves counseling with a mental health professional, medicine, or a combination of both. Your health care provider will help determine what  treatment is best for you.  Panic attacks due to physical illness usually go away with treatment of the illness. If prescription medicine is causing panic attacks, talk with your health care provider about stopping the medicine, decreasing the dose, or substituting another medicine.  Panic attacks due to alcohol or drug abuse go away with abstinence. Some adults need professional help in order to stop drinking or using drugs. HOME CARE INSTRUCTIONS   Take all medicines as directed by your health care provider.   Schedule and attend follow-up visits as directed by your health care provider. It is important to keep all your appointments. SEEK MEDICAL CARE IF:  You are not able to take your medicines as prescribed.  Your symptoms do not improve or get worse. SEEK IMMEDIATE MEDICAL CARE IF:   You experience panic attack symptoms that are different than your usual symptoms.  You have serious thoughts about hurting yourself or others.  You are taking medicine for panic attacks and have a serious side effect. MAKE SURE YOU:  Understand these instructions.  Will watch your condition.  Will get help right away if you are not doing well or get worse. Document Released: 08/06/2005 Document Revised: 08/11/2013 Document Reviewed: 03/20/2013 [facility] Patient Information 2015 [place], [place]. This information is not intended to replace advice given to you by your health care provider. Make sure you discuss any questions you have with your health care provider.

## 2014-05-04 LAB — GC/CHLAMYDIA PROBE AMP
CT Probe RNA: NEGATIVE
GC Probe RNA: NEGATIVE

## 2014-05-27 ENCOUNTER — Other Ambulatory Visit: Payer: Self-pay | Admitting: Obstetrics & Gynecology

## 2014-05-27 DIAGNOSIS — N6312 Unspecified lump in the right breast, upper inner quadrant: Secondary | ICD-10-CM

## 2014-06-02 ENCOUNTER — Other Ambulatory Visit: Payer: Medicaid Other

## 2014-06-07 ENCOUNTER — Inpatient Hospital Stay (HOSPITAL_COMMUNITY): Payer: Medicaid Other

## 2014-06-07 ENCOUNTER — Inpatient Hospital Stay (HOSPITAL_COMMUNITY)
Admission: AD | Admit: 2014-06-07 | Discharge: 2014-06-07 | Disposition: A | Discharge status: Home / Self Care | Payer: Medicaid Other | Source: Ambulatory Visit | Attending: Obstetrics and Gynecology | Admitting: Obstetrics and Gynecology

## 2014-06-07 ENCOUNTER — Encounter (HOSPITAL_COMMUNITY): Payer: Self-pay

## 2014-06-07 DIAGNOSIS — D649 Anemia, unspecified: Secondary | ICD-10-CM | POA: Diagnosis not present

## 2014-06-07 DIAGNOSIS — K920 Hematemesis: Secondary | ICD-10-CM | POA: Diagnosis present

## 2014-06-07 DIAGNOSIS — O99012 Anemia complicating pregnancy, second trimester: Secondary | ICD-10-CM | POA: Diagnosis not present

## 2014-06-07 DIAGNOSIS — R12 Heartburn: Secondary | ICD-10-CM | POA: Diagnosis not present

## 2014-06-07 DIAGNOSIS — O9989 Other specified diseases and conditions complicating pregnancy, childbirth and the puerperium: Secondary | ICD-10-CM | POA: Insufficient documentation

## 2014-06-07 DIAGNOSIS — O26899 Other specified pregnancy related conditions, unspecified trimester: Secondary | ICD-10-CM

## 2014-06-07 DIAGNOSIS — Z3A16 16 weeks gestation of pregnancy: Secondary | ICD-10-CM | POA: Insufficient documentation

## 2014-06-07 DIAGNOSIS — R109 Unspecified abdominal pain: Secondary | ICD-10-CM

## 2014-06-07 LAB — LACTATE DEHYDROGENASE: LDH: 166 U/L (ref 94–250)

## 2014-06-07 LAB — COMPREHENSIVE METABOLIC PANEL
ALT: 10 U/L (ref 0–35)
AST: 16 U/L (ref 0–37)
Albumin: 3.5 g/dL (ref 3.5–5.2)
Alkaline Phosphatase: 51 U/L (ref 39–117)
Anion gap: 12 (ref 5–15)
BUN: 11 mg/dL (ref 6–23)
CO2: 22 mEq/L (ref 19–32)
Calcium: 9.1 mg/dL (ref 8.4–10.5)
Chloride: 99 mEq/L (ref 96–112)
Creatinine, Ser: 0.62 mg/dL (ref 0.50–1.10)
GFR calc Af Amer: >90 mL/min (ref >90)
GFR calc non Af Amer: >90 mL/min (ref >90)
Glucose, Bld: 66 mg/dL — ABNORMAL LOW (ref 70–99)
Potassium: 4 mEq/L (ref 3.7–5.3)
Sodium: 133 mEq/L — ABNORMAL LOW (ref 137–147)
Total Bilirubin: 0.3 mg/dL (ref 0.3–1.2)
Total Protein: 7.7 g/dL (ref 6.0–8.3)

## 2014-06-07 LAB — URINALYSIS, ROUTINE W REFLEX MICROSCOPIC
Bilirubin Urine: NEGATIVE
Glucose, UA: NEGATIVE mg/dL
Hgb urine dipstick: NEGATIVE
Ketones, ur: >80 mg/dL — AB
Leukocytes, UA: NEGATIVE
Nitrite: NEGATIVE
Protein, ur: 30 mg/dL — AB
Specific Gravity, Urine: 1.025 (ref 1.005–1.030)
Urobilinogen, UA: 1 mg/dL (ref 0.0–1.0)
pH: 6 (ref 5.0–8.0)

## 2014-06-07 LAB — CBC WITH DIFFERENTIAL/PLATELET
Basophils Absolute: 0 10*3/uL (ref 0.0–0.1)
Basophils Relative: 0 % (ref 0–1)
Eosinophils Absolute: 0.1 10*3/uL (ref 0.0–0.7)
Eosinophils Relative: 2 % (ref 0–5)
HCT: 28.2 % — ABNORMAL LOW (ref 36.0–46.0)
Hemoglobin: 8.9 g/dL — ABNORMAL LOW (ref 12.0–15.0)
Lymphocytes Relative: 17 % (ref 12–46)
Lymphs Abs: 1.3 10*3/uL (ref 0.7–4.0)
MCH: 22.3 pg — ABNORMAL LOW (ref 26.0–34.0)
MCHC: 31.6 g/dL (ref 30.0–36.0)
MCV: 70.5 fL — ABNORMAL LOW (ref 78.0–100.0)
Monocytes Absolute: 0.7 10*3/uL (ref 0.1–1.0)
Monocytes Relative: 9 % (ref 3–12)
Neutro Abs: 5.4 10*3/uL (ref 1.7–7.7)
Neutrophils Relative %: 72 % (ref 43–77)
Platelets: 286 10*3/uL (ref 150–400)
RBC: 4 MIL/uL (ref 3.87–5.11)
RDW: 20.7 % — ABNORMAL HIGH (ref 11.5–15.5)
WBC: 7.5 10*3/uL (ref 4.0–10.5)

## 2014-06-07 LAB — PROTEIN / CREATININE RATIO, URINE
Creatinine, Urine: 527.2 mg/dL
Protein Creatinine Ratio: 0.12 (ref 0.00–0.15)
Total Protein, Urine: 61.4 mg/dL

## 2014-06-07 LAB — URINE MICROSCOPIC-ADD ON

## 2014-06-07 LAB — AMYLASE: Amylase: 42 U/L (ref 0–105)

## 2014-06-07 LAB — LIPASE, BLOOD: Lipase: 16 U/L (ref 11–59)

## 2014-06-07 LAB — URIC ACID: Uric Acid, Serum: 2.4 mg/dL (ref 2.4–7.0)

## 2014-06-07 MED ORDER — RANITIDINE HCL 150 MG PO TABS: 150.0000 mg | ORAL_TABLET | Freq: Two times a day (BID) | ORAL | Status: DC

## 2014-06-07 MED ORDER — IRON (FERROUS GLUCONATE) 256 (28 FE) MG PO TABS: 1.0000 | ORAL_TABLET | Freq: Two times a day (BID) | ORAL | Status: DC

## 2014-06-07 NOTE — MAU Note (Signed)
Pt here via EMS for nausea/vomiting x1 today with blood noted. Denies bleeding or abnormal vaginal discharge. Having abdominal pain, points to epigastric area.

## 2014-06-07 NOTE — Discharge Instructions (Signed)
Abdominal Pain During Pregnancy Belly (abdominal) pain is common during pregnancy. Most of the time, it is not a serious problem. Other times, it can be a sign that something is wrong with the pregnancy. Always tell your doctor if you have belly pain. HOME CARE Monitor your belly pain for any changes. The following actions may help you feel better:  Do not have sex (intercourse) or put anything in your vagina until you feel better.  Rest until your pain stops.  Drink clear fluids if you feel sick to your stomach (nauseous). Do not eat solid food until you feel better.  Only take medicine as told by your doctor.  Keep all doctor visits as told. GET HELP RIGHT AWAY IF:   You are bleeding, leaking fluid, or pieces of tissue come out of your vagina.  You have more pain or cramping.  You keep throwing up (vomiting).  You have pain when you pee (urinate) or have blood in your pee.  You have a fever.  You do not feel your baby moving as much.  You feel very weak or feel like passing out.  You have trouble breathing, with or without belly pain.  You have a very bad headache and belly pain.  You have fluid leaking from your vagina and belly pain.  You keep having watery poop (diarrhea).  Your belly pain does not go away after resting, or the pain gets worse. MAKE SURE YOU:   Understand these instructions.  Will watch your condition.  Will get help right away if you are not doing well or get worse. Document Released: 07/25/2009 Document Revised: 04/08/2013 Document Reviewed: 03/05/2013 [facility] Patient Information 2015 [place], [place]. This information is not intended to replace advice given to you by your health care provider. Make sure you discuss any questions you have with your health care provider. Heartburn During Pregnancy  Heartburn happens when stomach acid goes up into the esophagus. The esophagus is the tube between the mouth and the stomach. This acid causes a  burning pain in the chest or throat. This happens more often in the later part of pregnancy because the womb (uterus) gets larger. It may also happen because of hormone changes. Heartburn problems often go away after giving birth. HOME CARE  Take all medicine as told by your doctor.  Raise the head of your bed with blocks only as told by your doctor.  Do not exercise right after eating.  Avoid eating 2-3 hours before bed. Do not lie down right after eating.  Eat small meals throughout the day instead of 3 large meals.  Avoid foods that give you heartburn. Foods you may want to avoid include:  Peppers.  Chocolate.  High-fat foods, including fried foods.  Spicy foods.  Garlic and onions.  Citrus fruits, including oranges, grapefruit, lemons, and limes.  Food containing tomatoes or tomato products.  Mint.  Bubbly (carbonated) drinks and drinks with caffeine.  Vinegar. GET HELP IF:  You have any belly (abdominal) pain.  You feel burning in your upper belly or chest, especially after eating or lying down.  You feel sick to your stomach (nauseous) and throw up (vomit).  Your stomach feels upset after you eat. GET HELP RIGHT AWAY IF:  You have bad chest pain that goes down your arm or into your jaw or neck.  You feel sweaty, dizzy, or light-headed.  You have trouble breathing.  You throw up blood.  You have trouble or pain when swallowing.  You  have bloody or black poop (stool).  You have heartburn more than 3 times a week, for more than 2 weeks. MAKE SURE YOU:  Understand these instructions.  Will watch your condition.  Will get help right away if you are not doing well or get worse. Document Released: 09/08/2010 Document Revised: 08/11/2013 Document Reviewed: 03/25/2013 [facility] Patient Information 2015 [place], [place]. This information is not intended to replace advice given to you by your health care provider. Make sure you discuss any questions you have  with your health care provider.

## 2014-06-07 NOTE — MAU Provider Note (Signed)
History   Continuation of V.Standard, CNM note.  Melanie Hancock is a 18 y.o. [id number] at 16wks who presented s/p hematemesis.  Patient reports "not feeling good" prior to the incident and did have some vomiting of "the food I ate earlier."  Patient states that after this incident the hematemesis occurred with mucous noted.  Patient unable to describe amount of blood and mucous in emesis.   Patient reports history and recent headache that is unrelieved with tylenol.  Patient also reporting "sour stomach and burning in chest" that is relieved with milk consumption.  Patient reports that she now feels better, despite no pharmacologic interventions.   Patient Active Problem List   Diagnosis Date Noted  . Anxiety 03/28/2014  . Chronic back pain 03/28/2014  . Anemia 05/26/2013  . Hx pyelonephritis 2014 05/26/2013    Chief Complaint  Patient presents with  . Vomiting   HPI  OB History   Grav Para Term Preterm Abortions TAB SAB Ect Mult Living   4 1 1  2 1 1   1       Past Medical History  Diagnosis Date  . Infection     UTI  . UTI (lower urinary tract infection)   . Anxiety   . Anemia     Past Surgical History  Procedure Laterality Date  . Induced abortion    . Dilation and curettage of uterus      Family History  Problem Relation Age of Onset  . Alcohol abuse Neg Hx     History  Substance Use Topics  . Smoking status: Never Smoker   . Smokeless tobacco: Never Used  . Alcohol Use: No    Allergies: No Known Allergies  Prescriptions prior to admission  Medication Sig Dispense Refill  . cyclobenzaprine (FLEXERIL) 10 MG tablet Take 1 tablet (10 mg total) by mouth 2 (two) times daily as needed for muscle spasms.  20 tablet  0  . Prenatal Vit-Fe Fumarate-FA (PRENATAL MULTIVITAMIN) TABS tablet Take 1 tablet by mouth daily at 12 noon.      . [DISCONTINUED] famotidine (PEPCID) 20 MG tablet Take 1 tablet (20 mg total) by mouth 2 (two) times daily.  30 tablet  0  .  [DISCONTINUED] meclizine (ANTIVERT) 25 MG tablet Take 1 tablet (25 mg total) by mouth 3 (three) times daily as needed for dizziness.  30 tablet  0  . [DISCONTINUED] metoCLOPramide (REGLAN) 10 MG tablet Take 1 tablet (10 mg total) by mouth every 6 (six) hours.  30 tablet  0  . [DISCONTINUED] metroNIDAZOLE (METROGEL) 1 % gel Apply topically daily.      . [DISCONTINUED] promethazine (PHENERGAN) 25 MG tablet Take 1 tablet (25 mg total) by mouth every 6 (six) hours as needed for nausea or vomiting.  30 tablet  0    ROS  See HPI Above Physical Exam   Blood pressure 110/63, pulse 78, temperature 98.3 F (36.8 C), temperature source Oral, resp. rate 16, last menstrual period 02/15/2014, unknown if currently breastfeeding.  Results for orders placed during the hospital encounter of 06/07/14 (from the past 24 hour(s))  URINALYSIS, ROUTINE W REFLEX MICROSCOPIC     Status: Abnormal   Collection Time    06/07/14  5:23 PM      Result Value Ref Range   Color, Urine YELLOW  YELLOW   APPearance HAZY (*) CLEAR   Specific Gravity, Urine 1.025  1.005 - 1.030   pH 6.0  5.0 - 8.0   Glucose, UA  NEGATIVE  NEGATIVE mg/dL   Hgb urine dipstick NEGATIVE  NEGATIVE   Bilirubin Urine NEGATIVE  NEGATIVE   Ketones, ur >80 (*) NEGATIVE mg/dL   Protein, ur 30 (*) NEGATIVE mg/dL   Urobilinogen, UA 1.0  0.0 - 1.0 mg/dL   Nitrite NEGATIVE  NEGATIVE   Leukocytes, UA NEGATIVE  NEGATIVE  URINE MICROSCOPIC-ADD ON     Status: Abnormal   Collection Time    06/07/14  5:23 PM      Result Value Ref Range   Squamous Epithelial / LPF FEW (*) RARE   WBC, UA 0-2  <3 WBC/hpf   Urine-Other MUCOUS PRESENT    PROTEIN / CREATININE RATIO, URINE     Status: None   Collection Time    06/07/14  5:23 PM      Result Value Ref Range   Creatinine, Urine 527.20     Total Protein, Urine 61.4     Protein Creatinine Ratio 0.12  0.00 - 0.15  CBC WITH DIFFERENTIAL     Status: Abnormal   Collection Time    06/07/14  6:20 PM      Result  Value Ref Range   WBC 7.5  4.0 - 10.5 K/uL   RBC 4.00  3.87 - 5.11 MIL/uL   Hemoglobin 8.9 (*) 12.0 - 15.0 g/dL   HCT 16.128.2 (*) 09.636.0 - 04.546.0 %   MCV 70.5 (*) 78.0 - 100.0 fL   MCH 22.3 (*) 26.0 - 34.0 pg   MCHC 31.6  30.0 - 36.0 g/dL   RDW 40.920.7 (*) 81.111.5 - 91.415.5 %   Platelets 286  150 - 400 K/uL   Neutrophils Relative % 72  43 - 77 %   Neutro Abs 5.4  1.7 - 7.7 K/uL   Lymphocytes Relative 17  12 - 46 %   Lymphs Abs 1.3  0.7 - 4.0 K/uL   Monocytes Relative 9  3 - 12 %   Monocytes Absolute 0.7  0.1 - 1.0 K/uL   Eosinophils Relative 2  0 - 5 %   Eosinophils Absolute 0.1  0.0 - 0.7 K/uL   Basophils Relative 0  0 - 1 %   Basophils Absolute 0.0  0.0 - 0.1 K/uL  COMPREHENSIVE METABOLIC PANEL     Status: Abnormal   Collection Time    06/07/14  6:20 PM      Result Value Ref Range   Sodium 133 (*) 137 - 147 mEq/L   Potassium 4.0  3.7 - 5.3 mEq/L   Chloride 99  96 - 112 mEq/L   CO2 22  19 - 32 mEq/L   Glucose, Bld 66 (*) 70 - 99 mg/dL   BUN 11  6 - 23 mg/dL   Creatinine, Ser 7.820.62  0.50 - 1.10 mg/dL   Calcium 9.1  8.4 - 95.610.5 mg/dL   Total Protein 7.7  6.0 - 8.3 g/dL   Albumin 3.5  3.5 - 5.2 g/dL   AST 16  0 - 37 U/L   ALT 10  0 - 35 U/L   Alkaline Phosphatase 51  39 - 117 U/L   Total Bilirubin 0.3  0.3 - 1.2 mg/dL   GFR calc non Af Amer >90  >90 mL/min   GFR calc Af Amer >90  >90 mL/min   Anion gap 12  5 - 15  AMYLASE     Status: None   Collection Time    06/07/14  6:20 PM      Result Value Ref  Range   Amylase 42  0 - 105 U/L  LIPASE, BLOOD     Status: None   Collection Time    06/07/14  6:20 PM      Result Value Ref Range   Lipase 16  11 - 59 U/L  URIC ACID     Status: None   Collection Time    06/07/14  6:22 PM      Result Value Ref Range   Uric Acid, Serum 2.4  2.4 - 7.0 mg/dL  LACTATE DEHYDROGENASE     Status: None   Collection Time    06/07/14  6:22 PM      Result Value Ref Range   LDH 166  94 - 250 U/L    Physical Exam  Constitutional: She is oriented to person,  place, and time. She appears well-developed and well-nourished. No distress.  Cardiovascular: Normal rate, regular rhythm and normal heart sounds.   Respiratory: Effort normal and breath sounds normal.  GI: Soft. Bowel sounds are normal. There is tenderness in the right lower quadrant. There is no rebound, no guarding and no CVA tenderness.  Genitourinary:  Deferred  Neurological: She is alert and oriented to person, place, and time.  Skin: Skin is warm and dry.    ED Course  Assessment: IUP at 16wks Hematemesis Heartburn Anemia  Plan: -Awaiting on [place] -Discussed possible causes for hematemesis including heartburn/gerd, nasal drainage, or unknown reasons -Educated on iron rich diet and heartburn relief measures -Rx for Fe and Zantac, education provided  Follow Up (2110) -[place] Scan Reviewed: FHTs 158, CL: 3.92 Breech Position, "Contraction" noted--Final report pending -Keep appt as scheduled: 06/24/2014 -Encouraged to call if any questions or concerns arise prior to next scheduled office visit.  -Discharged to home in stable condition.   Khaliah Barnick LYNN CNM, MSN 06/07/2014 8:07 PM

## 2014-06-07 NOTE — MAU Provider Note (Signed)
Melanie Hancock is a 18 y.o. G4P1021 at 16.0 weeks presented to MAU via EMS unannounced  But walked out of the ambulance into MAU.  1) She c/o n/v that contained mucus and blood. 2) She c/o abd pain x 2 weeks, acid reflux and light headedness.  3) Denies abd cramping but c/o lower back pain.  4) Pt called the office 06/04/14 c/o severe HA.  She was given a Rx for fiorcet that was not filled.  She c/o of a "migraine" HA today.  She denies a hx of migraine HAs.  A neuro consult referral has been made.    History     Patient Active Problem List   Diagnosis Date Noted  . Anxiety 03/28/2014  . Chronic back pain 03/28/2014  . Anemia 05/26/2013  . Hx pyelonephritis 2014 05/26/2013    No chief complaint on file.  HPI  OB History   Grav Para Term Preterm Abortions TAB SAB Ect Mult Living   4 1 1  2 1 1   1       Past Medical History  Diagnosis Date  . Infection     UTI  . UTI (lower urinary tract infection)   . Anxiety   . Anemia     Past Surgical History  Procedure Laterality Date  . Induced abortion      Family History  Problem Relation Age of Onset  . Alcohol abuse Neg Hx     History  Substance Use Topics  . Smoking status: Never Smoker   . Smokeless tobacco: Never Used  . Alcohol Use: No    Allergies: No Known Allergies  Prescriptions prior to admission  Medication Sig Dispense Refill  . cyclobenzaprine (FLEXERIL) 10 MG tablet Take 1 tablet (10 mg total) by mouth 2 (two) times daily as needed for muscle spasms.  20 tablet  0  . famotidine (PEPCID) 20 MG tablet Take 1 tablet (20 mg total) by mouth 2 (two) times daily.  30 tablet  0  . meclizine (ANTIVERT) 25 MG tablet Take 1 tablet (25 mg total) by mouth 3 (three) times daily as needed for dizziness.  30 tablet  0  . metoCLOPramide (REGLAN) 10 MG tablet Take 1 tablet (10 mg total) by mouth every 6 (six) hours.  30 tablet  0  . metroNIDAZOLE (METROGEL) 1 % gel Apply topically daily.      . Prenatal Vit-Fe  Fumarate-FA (PRENATAL MULTIVITAMIN) TABS tablet Take 1 tablet by mouth daily at 12 noon.      . promethazine (PHENERGAN) 25 MG tablet Take 1 tablet (25 mg total) by mouth every 6 (six) hours as needed for nausea or vomiting.  30 tablet  0    ROS See HPI above, all other systems are negative  Physical Exam   Last menstrual period 02/15/2014, unknown if currently breastfeeding.  Physical Exam Ext:  WNL ABD: Soft, tender to palpation in the LRQ SVE: deferred   ED Course  Assessment: IUP at  16.0 weeks Membranes: intact FHR:  150 CTX:  n/a   Plan: Labs: CBC, TSH, GC/CL, Wet prep, amylase, lipase, cmp, UA OB US [facility]    Jamieson Hetland, CNM, MSN 06/07/2014. 5:27 PM

## 2014-06-10 ENCOUNTER — Ambulatory Visit
Admission: RE | Admit: 2014-06-10 | Discharge: 2014-06-10 | Disposition: A | Discharge status: Home / Self Care | Payer: Medicaid Other | Source: Ambulatory Visit | Attending: Obstetrics & Gynecology | Admitting: Obstetrics & Gynecology

## 2014-06-10 DIAGNOSIS — N6312 Unspecified lump in the right breast, upper inner quadrant: Secondary | ICD-10-CM

## 2014-06-18 ENCOUNTER — Inpatient Hospital Stay (HOSPITAL_COMMUNITY)
Admission: AD | Admit: 2014-06-18 | Discharge: 2014-06-19 | Disposition: A | Discharge status: Home / Self Care | Payer: Medicaid Other | Source: Ambulatory Visit | Attending: Obstetrics and Gynecology | Admitting: Obstetrics and Gynecology

## 2014-06-18 ENCOUNTER — Encounter (HOSPITAL_COMMUNITY): Payer: Self-pay

## 2014-06-18 DIAGNOSIS — O26892 Other specified pregnancy related conditions, second trimester: Secondary | ICD-10-CM | POA: Insufficient documentation

## 2014-06-18 DIAGNOSIS — M549 Dorsalgia, unspecified: Secondary | ICD-10-CM | POA: Diagnosis not present

## 2014-06-18 DIAGNOSIS — R109 Unspecified abdominal pain: Secondary | ICD-10-CM | POA: Insufficient documentation

## 2014-06-18 DIAGNOSIS — R102 Pelvic and perineal pain: Secondary | ICD-10-CM | POA: Insufficient documentation

## 2014-06-18 DIAGNOSIS — B9689 Other specified bacterial agents as the cause of diseases classified elsewhere: Secondary | ICD-10-CM

## 2014-06-18 DIAGNOSIS — Z3A18 18 weeks gestation of pregnancy: Secondary | ICD-10-CM | POA: Diagnosis not present

## 2014-06-18 DIAGNOSIS — N760 Acute vaginitis: Secondary | ICD-10-CM

## 2014-06-18 LAB — WET PREP, GENITAL
Trich, Wet Prep: NONE SEEN
Yeast Wet Prep HPF POC: NONE SEEN

## 2014-06-18 LAB — URINALYSIS, ROUTINE W REFLEX MICROSCOPIC
Bilirubin Urine: NEGATIVE
Glucose, UA: NEGATIVE mg/dL
Hgb urine dipstick: NEGATIVE
Ketones, ur: NEGATIVE mg/dL
Leukocytes, UA: NEGATIVE
Nitrite: NEGATIVE
Protein, ur: NEGATIVE mg/dL
Specific Gravity, Urine: 1.015 (ref 1.005–1.030)
Urobilinogen, UA: 0.2 mg/dL (ref 0.0–1.0)
pH: 7 (ref 5.0–8.0)

## 2014-06-18 MED ORDER — METRONIDAZOLE 500 MG PO TABS: 500.0000 mg | ORAL_TABLET | Freq: Two times a day (BID) | ORAL | Status: DC

## 2014-06-18 MED ORDER — DOCUSATE SODIUM 100 MG PO CAPS: 100.0000 mg | ORAL_CAPSULE | Freq: Two times a day (BID) | ORAL | Status: DC

## 2014-06-18 NOTE — Progress Notes (Signed)
Notified of pt arrival in MAU  

## 2014-06-18 NOTE — MAU Note (Signed)
Urine in lab 

## 2014-06-18 NOTE — Progress Notes (Signed)
Jessica Emly CNM in earlier to discuss test results and d/c plan. Written and verbal d/c instructions given and understanding voiced. 

## 2014-06-18 NOTE — Discharge Instructions (Signed)
Bacterial Vaginosis °Bacterial vaginosis is a vaginal infection that occurs when the normal balance of bacteria in the vagina is disrupted. It results from an overgrowth of certain bacteria. This is the most common vaginal infection in women of childbearing age. Treatment is important to prevent complications, especially in pregnant women, as it can cause a premature delivery. °CAUSES  °Bacterial vaginosis is caused by an increase in harmful bacteria that are normally present in smaller amounts in the vagina. Several different kinds of bacteria can cause bacterial vaginosis. However, the reason that the condition develops is not fully understood. °RISK FACTORS °Certain activities or behaviors can put you at an increased risk of developing bacterial vaginosis, including: °· Having a new sex partner or multiple sex partners. °· Douching. °· Using an intrauterine device (IUD) for contraception. °Women do not get bacterial vaginosis from toilet seats, bedding, swimming pools, or contact with objects around them. °SIGNS AND SYMPTOMS  °Some women with bacterial vaginosis have no signs or symptoms. Common symptoms include: °· Grey vaginal discharge. °· A fishlike odor with discharge, especially after sexual intercourse. °· Itching or burning of the vagina and vulva. °· Burning or pain with urination. °DIAGNOSIS  °Your health care provider will take a medical history and examine the vagina for signs of bacterial vaginosis. A sample of vaginal fluid may be taken. Your health care provider will look at this sample under a microscope to check for bacteria and abnormal cells. A vaginal pH test may also be done.  °TREATMENT  °Bacterial vaginosis may be treated with antibiotic medicines. These may be given in the form of a pill or a vaginal cream. A second round of antibiotics may be prescribed if the condition comes back after treatment.  °HOME CARE INSTRUCTIONS  °· Only take over-the-counter or prescription medicines as  directed by your health care provider. °· If antibiotic medicine was prescribed, take it as directed. Make sure you finish it even if you start to feel better. °· Do not have sex until treatment is completed. °· Tell all sexual partners that you have a vaginal infection. They should see their health care provider and be treated if they have problems, such as a mild rash or itching. °· Practice safe sex by using condoms and only having one sex partner. °SEEK MEDICAL CARE IF:  °· Your symptoms are not improving after 3 days of treatment. °· You have increased discharge or pain. °· You have a fever. °MAKE SURE YOU:  °· Understand these instructions. °· Will watch your condition. °· Will get help right away if you are not doing well or get worse. °FOR MORE INFORMATION  °Centers for Disease Control and Prevention, Division of STD Prevention: www.cdc.gov/std °American Sexual Health Association (ASHA): www.ashastd.org  °Document Released: 08/06/2005 Document Revised: 05/27/2013 Document Reviewed: 03/18/2013 °ExitCare® Patient Information ©2015 [employer]. This information is not intended to replace advice given to you by your health care provider. Make sure you discuss any questions you have with your health care provider. ° °Back Pain in Pregnancy °Back pain during pregnancy is common. It happens in about half of all pregnancies. It is important for you and your baby that you remain active during your pregnancy. If you feel that back pain is not allowing you to remain active or sleep well, it is time to see your caregiver. Back pain may be caused by several factors related to changes during your pregnancy. Fortunately, unless you had trouble with your back before your pregnancy, the pain is likely to   get better after you deliver. °Low back pain usually occurs between the fifth and seventh months of pregnancy. It can, however, happen in the first couple months. Factors that increase the risk of back problems include:    °· Previous back problems. °· Injury to your back. °· Having twins or multiple births. °· A chronic cough. °· Stress. °· Job-related repetitive motions. °· Muscle or spinal disease in the back. °· Family history of back problems, ruptured (herniated) discs, or osteoporosis. °· Depression, anxiety, and panic attacks. °CAUSES  °· When you are pregnant, your body produces a hormone called relaxin. This hormone makes the ligaments connecting the low back and pubic bones more flexible. This flexibility allows the baby to be delivered more easily. When your ligaments are loose, your muscles need to work harder to support your back. Soreness in your back can come from tired muscles. Soreness can also come from back tissues that are irritated since they are receiving less support. °· As the baby grows, it puts pressure on the nerves and blood vessels in your pelvis. This can cause back pain. °· As the baby grows and gets heavier during pregnancy, the uterus pushes the stomach muscles forward and changes your center of gravity. This makes your back muscles work harder to maintain good posture. °SYMPTOMS  °Lumbar pain during pregnancy °Lumbar pain during pregnancy usually occurs at or above the waist in the center of the back. There may be pain and numbness that radiates into your leg or foot. This is similar to low back pain experienced by non-pregnant women. It usually increases with sitting for long periods of time, standing, or repetitive lifting. Tenderness may also be present in the muscles along your upper back. °Posterior pelvic pain during pregnancy °Pain in the back of the pelvis is more common than lumbar pain in pregnancy. It is a deep pain felt in your side at the waistline, or across the tailbone (sacrum), or in both places. You may have pain on one or both sides. This pain can also go into the buttocks and backs of the upper thighs. Pubic and groin pain may also be present. The pain does not quickly resolve  with rest, and morning stiffness may also be present. °Pelvic pain during pregnancy can be brought on by most activities. A high level of fitness before and during pregnancy may or may not prevent this problem. Labor pain is usually 1 to 2 minutes apart, lasts for about 1 minute, and involves a bearing down feeling or pressure in your pelvis. However, if you are at term with the pregnancy, constant low back pain can be the beginning of early labor, and you should be aware of this. °DIAGNOSIS  °X-rays of the back should not be done during the first 12 to 14 weeks of the pregnancy and only when absolutely necessary during the rest of the pregnancy. MRIs do not give off radiation and are safe during pregnancy. MRIs also should only be done when absolutely necessary. °HOME CARE INSTRUCTIONS °· Exercise as directed by your caregiver. Exercise is the most effective way to prevent or manage back pain. If you have a back problem, it is especially important to avoid sports that require sudden body movements. Swimming and walking are great activities. °· Do not stand in one place for long periods of time. °· Do not wear high heels. °· Sit in chairs with good posture. Use a pillow on your lower back if necessary. Make sure your head rests over your   shoulders and is not hanging forward. °· Try sleeping on your side, preferably the left side, with a pillow or two between your legs. If you are sore after a night's rest, your bed may be too soft. Try placing a board between your mattress and box spring. °· Listen to your body when lifting. If you are experiencing pain, ask for help or try bending your knees more so you can use your leg muscles rather than your back muscles. Squat down when picking up something from the floor. Do not bend over. °· Eat a healthy diet. Try to gain weight within your caregiver's recommendations. °· Use heat or cold packs 3 to 4 times a day for 15 minutes to help with the pain. °· Only take  over-the-counter or prescription medicines for pain, discomfort, or fever as directed by your caregiver. °Sudden (acute) back pain °· Use bed rest for only the most extreme, acute episodes of back pain. Prolonged bed rest over 48 hours will aggravate your condition. °· Ice is very effective for acute conditions. °¨ Put ice in a plastic bag. °¨ Place a towel between your skin and the bag. °¨ Leave the ice on for 10 to 20 minutes every 2 hours, or as needed. °· Using heat packs for 30 minutes prior to activities is also helpful. °Continued back pain °See your caregiver if you have continued problems. Your caregiver can help or refer you for appropriate physical therapy. With conditioning, most back problems can be avoided. Sometimes, a more serious issue may be the cause of back pain. You should be seen right away if new problems seem to be developing. Your caregiver may recommend: °· A maternity girdle. °· An elastic sling. °· A back brace. °· A massage therapist or acupuncture. °SEEK MEDICAL CARE IF:  °· You are not able to do most of your daily activities, even when taking the pain medicine you were given. °· You need a referral to a physical therapist or chiropractor. °· You want to try acupuncture. °SEEK IMMEDIATE MEDICAL CARE IF: °· You develop numbness, tingling, weakness, or problems with the use of your arms or legs. °· You develop severe back pain that is no longer relieved with medicines. °· You have a sudden change in bowel or bladder control. °· You have increasing pain in other areas of the body. °· You develop shortness of breath, dizziness, or fainting. °· You develop nausea, vomiting, or sweating. °· You have back pain which is similar to labor pains. °· You have back pain along with your water breaking or vaginal bleeding. °· You have back pain or numbness that travels down your leg. °· Your back pain developed after you fell. °· You develop pain on one side of your back. You may have a kidney  stone. °· You see blood in your urine. You may have a bladder infection or kidney stone. °· You have back pain with blisters. You may have shingles. °Back pain is fairly common during pregnancy but should not be accepted as just part of the process. Back pain should always be treated as soon as possible. This will make your pregnancy as pleasant as possible. °Document Released: 11/14/2005 Document Revised: 10/29/2011 Document Reviewed: 12/26/2010 °ExitCare® Patient Information ©2015 [employer]. This information is not intended to replace advice given to you by your health care provider. Make sure you discuss any questions you have with your health care provider. ° °

## 2014-06-18 NOTE — MAU Note (Signed)
Pt presents complaining of lower abdominal and back pain since 3pm. States her abdomen felt tight. Reports good fetal movement. Denies vaginal bleeding or discharge.

## 2014-06-18 NOTE — MAU Provider Note (Signed)
History    Melanie Hancock is a [age] y.o. [id number] at 17.4wks who presents for back and abdominal pain and vaginal pressure.  Patient states pain started this afternoon after work and has radiated up and down abdomen and from one side of her back to the other.  Patient reports that she was in a training all day and was sitting at a computer.  Patient reports taking tylenol, this afternoon, when pain started with minimal relief.  Patient reports vaginal pressure started shortly after and patient denies recent sexual intercourse, LOF, VB, and vaginal discharge.  Patient reports active fetus and states that she is complaint with zantac and pnv.  Patient also c/o constipation x 1 week as well as urinary frequency with minimal output.      Patient Active Problem List   Diagnosis Date Noted  . Anxiety 03/28/2014  . Chronic back pain 03/28/2014  . Anemia 05/26/2013  . Hx pyelonephritis 2014 05/26/2013    Chief Complaint  Patient presents with  . Abdominal Pain   HPI  OB History   Grav Para Term Preterm Abortions TAB SAB Ect Mult Living   4 1 1  2 1 1   1       Past Medical History  Diagnosis Date  . Infection     UTI  . UTI (lower urinary tract infection)   . Anxiety   . Anemia     Past Surgical History  Procedure Laterality Date  . Induced abortion    . Dilation and curettage of uterus      Family History  Problem Relation Age of Onset  . Alcohol abuse Neg Hx     History  Substance Use Topics  . Smoking status: Never Smoker   . Smokeless tobacco: Never Used  . Alcohol Use: No    Allergies: No Known Allergies  Prescriptions prior to admission  Medication Sig Dispense Refill  . Iron, Ferrous Gluconate, 256 (28 FE) MG TABS Take 1 tablet by mouth 2 (two) times daily before a meal.  60 tablet  5  . Prenatal Vit-Fe Fumarate-FA (PRENATAL MULTIVITAMIN) TABS tablet Take 1 tablet by mouth daily at 12 noon.      . ranitidine (ZANTAC) 150 MG tablet Take 1 tablet (150 mg total) by  mouth 2 (two) times daily.  60 tablet  2    ROS  See HPI Above Physical Exam   Blood pressure 115/53, pulse 75, temperature 98 F (36.7 C), temperature source Oral, resp. rate 18, last menstrual period 02/15/2014, unknown if currently breastfeeding.  Physical Exam  Constitutional: She is oriented to person, place, and time. She appears well-developed and well-nourished.  Cardiovascular: Normal rate, regular rhythm and normal heart sounds.   Respiratory: Effort normal and breath sounds normal.  GI: Soft. Bowel sounds are normal. There is tenderness in the right lower quadrant and left lower quadrant. There is no CVA tenderness.  Appears gravid--fundal height AGA, Fundus U/-1, Soft, Tender   Genitourinary: Rectum normal. Uterus is enlarged. Cervix exhibits motion tenderness.  Sterile Speculum Exam: -Labia: Skin tag noted on right lower aspect -Vaginal Vault: Thin white discharge noted-wet prep collected -Cervix: mucous noted at os-GC/CT collected -Positive CMT: Elicited via verbal response  Bimanual Exam: Closed/Long/Thick   Neurological: She is alert and oriented to person, place, and time.  Skin: Skin is warm and dry.   FHR: 136 by doppler ED Course  Assessment: IUP at 17.4wks Abdominal Pain Back Pain Vaginal Pressure  Plan: -Labs: UA, Wet  Prep, BV -Discussed vaginal hygiene, proper body mechanics, proper nutritional and hydration, and round ligament pain -Rx for colace  Follow Up (2341) -Wet prep +BV -Discussed results and necessary medications-Rx sent to pharmacy -Bleeding precations -Keep appt as scheduled: 06/24/2014 -Encouraged to call if any questions or concerns arise prior to next scheduled office visit.  -Discharged to home in stable condition  Zamzam Whinery LYNN CNM, MSN 06/18/2014 10:41 PM

## 2014-06-19 LAB — GC/CHLAMYDIA PROBE AMP
CT Probe RNA: NEGATIVE
GC Probe RNA: NEGATIVE

## 2014-06-21 ENCOUNTER — Encounter (HOSPITAL_COMMUNITY): Payer: Self-pay

## 2014-07-02 ENCOUNTER — Telehealth: Payer: Self-pay | Admitting: Neurology

## 2014-07-02 ENCOUNTER — Inpatient Hospital Stay (HOSPITAL_COMMUNITY)
Admission: AD | Admit: 2014-07-02 | Discharge: 2014-07-03 | Discharge status: Against Medical Advice | Payer: Medicaid Other | Source: Ambulatory Visit | Attending: Obstetrics and Gynecology | Admitting: Obstetrics and Gynecology

## 2014-07-02 ENCOUNTER — Ambulatory Visit: Payer: Medicaid Other | Admitting: Neurology

## 2014-07-02 DIAGNOSIS — M549 Dorsalgia, unspecified: Secondary | ICD-10-CM | POA: Insufficient documentation

## 2014-07-02 DIAGNOSIS — R109 Unspecified abdominal pain: Secondary | ICD-10-CM | POA: Insufficient documentation

## 2014-07-02 DIAGNOSIS — O9989 Other specified diseases and conditions complicating pregnancy, childbirth and the puerperium: Secondary | ICD-10-CM | POA: Insufficient documentation

## 2014-07-02 DIAGNOSIS — Z3A20 20 weeks gestation of pregnancy: Secondary | ICD-10-CM | POA: Insufficient documentation

## 2014-07-02 NOTE — Telephone Encounter (Signed)
Patient no showed for an appointment today, 07/02/2014, at 1030.

## 2014-07-02 NOTE — Telephone Encounter (Signed)
Patient was a no show for today's appointment-(07/02/14)

## 2014-07-02 NOTE — MAU Note (Signed)
I believe i was having contractions. Stomach getting tight and some back pain. Leaking some fld this wk.

## 2014-07-03 DIAGNOSIS — M549 Dorsalgia, unspecified: Secondary | ICD-10-CM | POA: Diagnosis not present

## 2014-07-03 DIAGNOSIS — O9989 Other specified diseases and conditions complicating pregnancy, childbirth and the puerperium: Secondary | ICD-10-CM | POA: Diagnosis not present

## 2014-07-03 DIAGNOSIS — R109 Unspecified abdominal pain: Secondary | ICD-10-CM | POA: Diagnosis present

## 2014-07-03 DIAGNOSIS — Z3A20 20 weeks gestation of pregnancy: Secondary | ICD-10-CM | POA: Diagnosis not present

## 2014-07-03 NOTE — MAU Note (Signed)
PT wants to leave AMA. Signed AMA form after explaining risks and benefits. Sherre ScarletKimberly Williams [facility] and aware

## 2014-07-06 ENCOUNTER — Encounter: Payer: Self-pay | Admitting: Neurology

## 2014-08-20 NOTE — L&D Delivery Note (Signed)
Vaginal Delivery Note Spontaneous rupture of membranes in MAU today, at 1955, light mec.  GBS was positive, PCN was not give d/t rapid cervical dilation and a precipitous delivery.  Cervical dilation was complete at  2044.  NICHD Category 2.    Pushing with guidance began at  2050.   After 2 minutes of pushing the head, shoulders and the body of a viable female infant "Melanie Hancock" delivered spontaneously with maternal effort in the ROA position at 2052.   Tight [place] x 1, unable to reduce infant somersaulted thru without difficulty.   With less than a vigorous tone and a delayed cry, the infant was placed on moms abd.  The cord was immediately clamped, cut and the infant was handed to the nursing staff for immediate assessment . Code APGAR was called   Spontaneous delivery of a intact placenta with a 3 vessel cord via Shultz at  2056.   Episiotomy: None   The vulva, perineum, vaginal vault, rectum and cervix were inspected no repairs needed.  Postpartum pitocin as ordered.   Uterus boggy immediately after delivery of placenta - fundal massage.   EBL 100, Pt hemodynamically stable.   Sponge, laps and needle count correct and verified with the primary care nurse.  Attending MD available at all times.    Mom and baby were left in stable condition, baby skin to skin. Routine postpartum orders   Mother unsure about method of contraception  Infant will not have a circumcision   Placenta to pathology: NO     Cord Gases sent to lab: YES Cord blood sent to lab: NO   APGARS:  3 at 1 minute, 6 at 5 minutes and 9 at 10 minutes Weight:. pending     Infant skin to skin, breastfeeding.  Both mom and baby were left in stable condition      Neysa Arts, CNM, MSN 11/20/2014. 9:26 PM

## 2014-09-22 ENCOUNTER — Other Ambulatory Visit: Payer: Self-pay | Admitting: Obstetrics and Gynecology

## 2014-09-22 LAB — OB RESULTS CONSOLE GC/CHLAMYDIA
Chlamydia: NEGATIVE
Gonorrhea: NEGATIVE

## 2014-09-27 ENCOUNTER — Telehealth: Payer: Self-pay | Admitting: Oncology

## 2014-09-27 NOTE — Telephone Encounter (Signed)
PT CONFIRMED APPT. 10/07/14 AT 1:30PM SHADAD DX: ANEMIA REFERRING DR. Emilee HeroLATHAM

## 2014-09-30 ENCOUNTER — Telehealth: Payer: Self-pay | Admitting: Oncology

## 2014-09-30 NOTE — Telephone Encounter (Signed)
CHART DELIVERED 09/30/14 °

## 2014-10-01 ENCOUNTER — Other Ambulatory Visit: Payer: Self-pay | Admitting: Oncology

## 2014-10-01 DIAGNOSIS — D500 Iron deficiency anemia secondary to blood loss (chronic): Secondary | ICD-10-CM

## 2014-10-06 ENCOUNTER — Telehealth: Payer: Self-pay | Admitting: *Deleted

## 2014-10-06 NOTE — Telephone Encounter (Signed)
This RN tried calling patient's cell phone to remind her of [facility] Clelia CroftShadad. Voice mail is full and could not leave a message.

## 2014-10-07 ENCOUNTER — Ambulatory Visit: Payer: Medicaid Other

## 2014-10-07 ENCOUNTER — Other Ambulatory Visit: Payer: Medicaid Other

## 2014-10-07 ENCOUNTER — Ambulatory Visit: Payer: Medicaid Other | Admitting: Oncology

## 2014-10-16 ENCOUNTER — Inpatient Hospital Stay (HOSPITAL_COMMUNITY)
Admission: AD | Admit: 2014-10-16 | Discharge: 2014-10-16 | Disposition: A | Discharge status: Home / Self Care | Payer: Medicaid Other | Source: Ambulatory Visit | Attending: Obstetrics and Gynecology | Admitting: Obstetrics and Gynecology

## 2014-10-16 ENCOUNTER — Encounter (HOSPITAL_COMMUNITY): Payer: Self-pay

## 2014-10-16 DIAGNOSIS — L293 Anogenital pruritus, unspecified: Secondary | ICD-10-CM | POA: Insufficient documentation

## 2014-10-16 DIAGNOSIS — N898 Other specified noninflammatory disorders of vagina: Secondary | ICD-10-CM | POA: Diagnosis present

## 2014-10-16 DIAGNOSIS — Z3A34 34 weeks gestation of pregnancy: Secondary | ICD-10-CM | POA: Diagnosis not present

## 2014-10-16 DIAGNOSIS — O9989 Other specified diseases and conditions complicating pregnancy, childbirth and the puerperium: Secondary | ICD-10-CM | POA: Insufficient documentation

## 2014-10-16 LAB — URINALYSIS, ROUTINE W REFLEX MICROSCOPIC
Glucose, UA: NEGATIVE mg/dL
Hgb urine dipstick: NEGATIVE
Ketones, ur: 15 mg/dL — AB
Nitrite: NEGATIVE
Protein, ur: NEGATIVE mg/dL
Specific Gravity, Urine: 1.025 (ref 1.005–1.030)
Urobilinogen, UA: 4 mg/dL — ABNORMAL HIGH (ref 0.0–1.0)
pH: 6 (ref 5.0–8.0)

## 2014-10-16 LAB — WET PREP, GENITAL
Clue Cells Wet Prep HPF POC: NONE SEEN
Trich, Wet Prep: NONE SEEN
Yeast Wet Prep HPF POC: NONE SEEN

## 2014-10-16 LAB — URINE MICROSCOPIC-ADD ON

## 2014-10-16 LAB — POCT FERN TEST: POCT Fern Test: NEGATIVE

## 2014-10-16 MED ORDER — NYSTATIN-TRIAMCINOLONE 100000-0.1 UNIT/GM-% EX OINT: 1.0000 "application " | TOPICAL_OINTMENT | Freq: Two times a day (BID) | CUTANEOUS | Status: DC

## 2014-10-16 NOTE — Discharge Instructions (Signed)
Third Trimester of Pregnancy The third trimester is from week 29 through week 42, months 7 through 9. The third trimester is a time when the fetus is growing rapidly. At the end of the ninth month, the fetus is about 20 inches in length and weighs 6-10 pounds.  BODY CHANGES Your body goes through many changes during pregnancy. The changes vary from woman to woman.   Your weight will continue to increase. You can expect to gain 25-35 pounds (11-16 kg) by the end of the pregnancy.  You may begin to get stretch marks on your hips, abdomen, and breasts.  You may urinate more often because the fetus is moving lower into your pelvis and pressing on your bladder.  You may develop or continue to have heartburn as a result of your pregnancy.  You may develop constipation because certain hormones are causing the muscles that push waste through your intestines to slow down.  You may develop hemorrhoids or swollen, bulging veins (varicose veins).  You may have pelvic pain because of the weight gain and pregnancy hormones relaxing your joints between the bones in your pelvis. Backaches may result from overexertion of the muscles supporting your posture.  You may have changes in your hair. These can include thickening of your hair, rapid growth, and changes in texture. Some women also have hair loss during or after pregnancy, or hair that feels dry or thin. Your hair will most likely return to normal after your baby is born.  Your breasts will continue to grow and be tender. A yellow discharge may leak from your breasts called colostrum.  Your belly button may stick out.  You may feel short of breath because of your expanding uterus.  You may notice the fetus "dropping," or moving lower in your abdomen.  You may have a bloody mucus discharge. This usually occurs a few days to a week before labor begins.  Your cervix becomes thin and soft (effaced) near your due date. WHAT TO EXPECT AT YOUR PRENATAL  EXAMS  You will have prenatal exams every 2 weeks until week 36. Then, you will have weekly prenatal exams. During a routine prenatal visit:  You will be weighed to make sure you and the fetus are growing normally.  Your blood pressure is taken.  Your abdomen will be measured to track your baby's growth.  The fetal heartbeat will be listened to.  Any test results from the previous visit will be discussed.  You may have a cervical check near your due date to see if you have effaced. At around 36 weeks, your caregiver will check your cervix. At the same time, your caregiver will also perform a test on the secretions of the vaginal tissue. This test is to determine if a type of bacteria, Group B streptococcus, is present. Your caregiver will explain this further. Your caregiver may ask you:  What your birth plan is.  How you are feeling.  If you are feeling the baby move.  If you have had any abnormal symptoms, such as leaking fluid, bleeding, severe headaches, or abdominal cramping.  If you have any questions. Other tests or screenings that may be performed during your third trimester include:  Blood tests that check for low iron levels (anemia).  Fetal testing to check the health, activity level, and growth of the fetus. Testing is done if you have certain medical conditions or if there are problems during the pregnancy. FALSE LABOR You may feel small, irregular contractions that   eventually go away. These are called Braxton Hicks contractions, or false labor. Contractions may last for hours, days, or even weeks before true labor sets in. If contractions come at regular intervals, intensify, or become painful, it is best to be seen by your caregiver.  SIGNS OF LABOR   Menstrual-like cramps.  Contractions that are 5 minutes apart or less.  Contractions that start on the top of the uterus and spread down to the lower abdomen and back.  A sense of increased pelvic pressure or back  pain.  A watery or bloody mucus discharge that comes from the vagina. If you have any of these signs before the 37th week of pregnancy, call your caregiver right away. You need to go to the hospital to get checked immediately. HOME CARE INSTRUCTIONS   Avoid all smoking, herbs, alcohol, and unprescribed drugs. These chemicals affect the formation and growth of the baby.  Follow your caregiver's instructions regarding medicine use. There are medicines that are either safe or unsafe to take during pregnancy.  Exercise only as directed by your caregiver. Experiencing uterine cramps is a good sign to stop exercising.  Continue to eat regular, healthy meals.  Wear a good support bra for breast tenderness.  Do not use hot tubs, steam rooms, or saunas.  Wear your seat belt at all times when driving.  Avoid raw meat, uncooked cheese, cat litter boxes, and soil used by cats. These carry germs that can cause birth defects in the baby.  Take your prenatal vitamins.  Try taking a stool softener (if your caregiver approves) if you develop constipation. Eat more high-fiber foods, such as fresh vegetables or fruit and whole grains. Drink plenty of fluids to keep your urine clear or pale yellow.  Take warm sitz baths to soothe any pain or discomfort caused by hemorrhoids. Use hemorrhoid cream if your caregiver approves.  If you develop varicose veins, wear support hose. Elevate your feet for 15 minutes, 3-4 times a day. Limit salt in your diet.  Avoid heavy lifting, wear low heal shoes, and practice good posture.  Rest a lot with your legs elevated if you have leg cramps or low back pain.  Visit your dentist if you have not gone during your pregnancy. Use a soft toothbrush to brush your teeth and be gentle when you floss.  A sexual relationship may be continued unless your caregiver directs you otherwise.  Do not travel far distances unless it is absolutely necessary and only with the approval  of your caregiver.  Take prenatal classes to understand, practice, and ask questions about the labor and delivery.  Make a trial run to the hospital.  Pack your hospital bag.  Prepare the baby's nursery.  Continue to go to all your prenatal visits as directed by your caregiver. SEEK MEDICAL CARE IF:  You are unsure if you are in labor or if your water has broken.  You have dizziness.  You have mild pelvic cramps, pelvic pressure, or nagging pain in your abdominal area.  You have persistent nausea, vomiting, or diarrhea.  You have a bad smelling vaginal discharge.  You have pain with urination. SEEK IMMEDIATE MEDICAL CARE IF:   You have a fever.  You are leaking fluid from your vagina.  You have spotting or bleeding from your vagina.  You have severe abdominal cramping or pain.  You have rapid weight loss or gain.  You have shortness of breath with chest pain.  You notice sudden or extreme swelling   of your face, hands, ankles, feet, or legs.  You have not felt your baby move in over an hour.  You have severe headaches that do not go away with medicine.  You have vision changes. Document Released: 07/31/2001 Document Revised: 08/11/2013 Document Reviewed: 10/07/2012 ExitCare Patient Information 2015 [employer]. This information is not intended to replace advice given to you by your health care provider. Make sure you discuss any questions you have with your health care provider.  

## 2014-10-16 NOTE — MAU Provider Note (Signed)
Melanie Hancock is a 19 y.o. G4P1021 at 34.5 weeks c/o vaginal itching and a discharge,  Denies vb or ctx w/+FM   History     Patient Active Problem List   Diagnosis Date Noted  . Anxiety 03/28/2014  . Chronic back pain 03/28/2014  . Anemia 05/26/2013  . Hx pyelonephritis 2014 05/26/2013    Chief Complaint  Patient presents with  . Vaginal Itching   HPI  OB History    Gravida Para Term Preterm AB TAB SAB Ectopic Multiple Living   4 1 1  2 1 1   1       Past Medical History  Diagnosis Date  . Infection     UTI  . UTI (lower urinary tract infection)   . Anxiety   . Anemia     Past Surgical History  Procedure Laterality Date  . Induced abortion    . Dilation and curettage of uterus      Family History  Problem Relation Age of Onset  . Alcohol abuse Neg Hx     History  Substance Use Topics  . Smoking status: Never Smoker   . Smokeless tobacco: Never Used  . Alcohol Use: No    Allergies: No Known Allergies  Prescriptions prior to admission  Medication Sig Dispense Refill Last Dose  . Prenatal Vit-Fe Fumarate-FA (PRENATAL MULTIVITAMIN) TABS tablet Take 1 tablet by mouth daily at 12 noon.   Past Month at Unknown time  . docusate sodium (COLACE) 100 MG capsule Take 1 capsule (100 mg total) by mouth 2 (two) times daily. For 5 days.  Then take daily, prn. 30 capsule 1 More than a month at Unknown time  . Iron, Ferrous Gluconate, 256 (28 FE) MG TABS Take 1 tablet by mouth 2 (two) times daily before a meal. 60 tablet 5 More than a month at Unknown time  . metroNIDAZOLE (FLAGYL) 500 MG tablet Take 1 tablet (500 mg total) by mouth 2 (two) times daily. 14 tablet 0   . ranitidine (ZANTAC) 150 MG tablet Take 1 tablet (150 mg total) by mouth 2 (two) times daily. 60 tablet 2     ROS See HPI above, all other systems are negative  Physical Exam   Blood pressure 118/58, pulse 103, temperature 98.1 F (36.7 C), temperature source Oral, resp. rate 16, height 5\' 6"  (1.676  m), weight 176 lb 4 oz (79.946 kg), last menstrual period 02/15/2014, SpO2 99 %, unknown if currently breastfeeding.  Physical Exam Ext:  WNL ABD: Soft, non tender to palpation, no rebound or guarding SVE: ft/t/h   ED Course  Assessment: IUP at  34.5weeks Membranes: intact FHR: Category  CTX:  None, irritibility Melanie Hancock  negative  Plan: [employer]et prep    Melanie Hancock, CNM, MSN 10/16/2014. 7:54 AM

## 2014-10-16 NOTE — MAU Note (Signed)
For 2 days, vaginal itching and irritation, redness with swelling.  ? Leaking fluid or urine- is not sure.  Reports urine is a lot darker.  No bleeding. Baby moving well.

## 2014-10-18 LAB — GC/CHLAMYDIA PROBE AMP (~~LOC~~) NOT AT ARMC
Chlamydia: NEGATIVE
Neisseria Gonorrhea: NEGATIVE

## 2014-10-19 LAB — OB RESULTS CONSOLE GBS: GBS: POSITIVE

## 2014-10-27 ENCOUNTER — Inpatient Hospital Stay (HOSPITAL_COMMUNITY)
Admission: AD | Admit: 2014-10-27 | Discharge: 2014-10-27 | Disposition: A | Discharge status: Home / Self Care | Payer: Medicaid Other | Source: Ambulatory Visit | Attending: Obstetrics and Gynecology | Admitting: Obstetrics and Gynecology

## 2014-10-27 ENCOUNTER — Encounter (HOSPITAL_COMMUNITY): Payer: Self-pay | Admitting: *Deleted

## 2014-10-27 DIAGNOSIS — Z3A26 26 weeks gestation of pregnancy: Secondary | ICD-10-CM | POA: Insufficient documentation

## 2014-10-27 DIAGNOSIS — O9989 Other specified diseases and conditions complicating pregnancy, childbirth and the puerperium: Secondary | ICD-10-CM | POA: Diagnosis not present

## 2014-10-27 DIAGNOSIS — N949 Unspecified condition associated with female genital organs and menstrual cycle: Secondary | ICD-10-CM | POA: Insufficient documentation

## 2014-10-27 HISTORY — DX: Tubulo-interstitial nephritis, not specified as acute or chronic: N12

## 2014-10-27 HISTORY — DX: Dorsalgia, unspecified: M54.9

## 2014-10-27 HISTORY — DX: Other chronic pain: G89.29

## 2014-10-27 LAB — AMNISURE RUPTURE OF MEMBRANE (ROM) NOT AT ARMC: Amnisure ROM: NEGATIVE

## 2014-10-27 MED ORDER — HYDROCODONE-ACETAMINOPHEN 5-325 MG PO TABS: 1.0000 | ORAL_TABLET | Freq: Four times a day (QID) | ORAL | Status: DC | PRN

## 2014-10-27 NOTE — Discharge Instructions (Signed)

## 2014-10-27 NOTE — MAU Provider Note (Signed)
History   19 yo G4P1021 at 24 2/7 weeks presented from the office for further assessment--seen there today with c/o loss of mucus plug, and ? Leaking.  Negative fern/pooling/nitrazene there, with external os 3 cm, internal os 1 cm.  Office NST reactive, with mild irritability.  Patient reports pelvic pain, largely in right inner thigh and groin, particularly when moving or walking.  Denies bleeding or leaking at present, but has has more vaginal d/c recently.  Patient Active Problem List   Diagnosis Date Noted  . Anxiety 03/28/2014  . Chronic back pain 03/28/2014  . Anemia 05/26/2013  . Hx pyelonephritis 2014 05/26/2013    Chief Complaint  Patient presents with  . "passed mucous plug"    HPI:  See above  OB History    Gravida Para Term Preterm AB TAB SAB Ectopic Multiple Living   Past Medical History  Diagnosis Date  . Infection     UTI  . UTI (lower urinary tract infection)   . Anxiety   . Anemia   . Pyelonephritis   . Chronic back pain     Past Surgical History  Procedure Laterality Date  . Induced abortion    . Dilation and curettage of uterus      Family History  Problem Relation Age of Onset  . Alcohol abuse Neg Hx     History  Substance Use Topics  . Smoking status: Never Smoker   . Smokeless tobacco: Never Used  . Alcohol Use: No    Allergies: No Known Allergies  Prescriptions prior to admission  Medication Sig Dispense Refill Last Dose  . Prenatal Vit-Fe Fumarate-FA (PRENATAL MULTIVITAMIN) TABS tablet Take 1 tablet by mouth daily at 12 noon.   Past Week at Unknown time  . docusate sodium (COLACE) 100 MG capsule Take 1 capsule (100 mg total) by mouth 2 (two) times daily. For 5 days.  Then take daily, prn. (Patient not taking: Reported on 10/27/2014) 30 capsule 1   . Iron, Ferrous Gluconate, 256 (28 FE) MG TABS Take 1 tablet by mouth 2 (two) times daily before a meal. (Patient not taking: Reported on 10/16/2014) 60 tablet 5   .  nystatin-triamcinolone ointment (MYCOLOG) Apply 1 application topically 2 (two) times daily. (Patient not taking: Reported on 10/27/2014) 30 g 0   . ranitidine (ZANTAC) 150 MG tablet Take 1 tablet (150 mg total) by mouth 2 (two) times daily. (Patient not taking: Reported on 10/16/2014) 60 tablet 2     ROS:  Pelvic pain, +FM Physical Exam   Blood pressure 110/59, pulse 100, temperature 97.8 F (36.6 C), temperature source Oral, resp. rate 18, height  (1.676 m), weight 175 lb (79.379 kg), last menstrual period 02/15/2014, unknown if currently breastfeeding.    Physical Exam  In NAD Chest clear Heart RRR without murmur Abd gravid, NT Pelvic--cervix posterior, ext os 3 cm, internal os 1 cm, 60%, vtx, -2. (no change from office exam this am) Ext WNL  FHR Category 1 UCs none, occasional mild irritability  ED Course  Assessment: IUP at 26 2/7 weeks No evidence labor Reactive FHR Pelvic pain of 3rd trimester, likely round ligament pain.  Plan: Amnisure--if negative, will d/c home. Comfort measures for pelvic pain, will offer po meds if desired.   Nigel Bridgeman CNM, MSN 10/27/2014 3:27 PM  Addendum:  Amnisure negative  Home undelivered with comfort measures for pelvic pain and round  ligament pain. Rx Vicodin  [facility] or call prn.  Nigel BridgemanVicki Sohaib Vereen, CNM 3.9.16 4:10p

## 2014-10-27 NOTE — MAU Note (Signed)
Patient states she was sent from MD office because she passed her mucous plug and she is having pelvic pain. Patient is on phone constantly, not looking up during interview and questions. Appears in no discomfort.

## 2014-10-30 ENCOUNTER — Inpatient Hospital Stay (HOSPITAL_COMMUNITY)
Admission: AD | Admit: 2014-10-30 | Discharge: 2014-10-30 | Disposition: A | Discharge status: Home / Self Care | Payer: Medicaid Other | Source: Ambulatory Visit | Attending: Obstetrics and Gynecology | Admitting: Obstetrics and Gynecology

## 2014-10-30 ENCOUNTER — Encounter (HOSPITAL_COMMUNITY): Payer: Self-pay | Admitting: *Deleted

## 2014-10-30 DIAGNOSIS — G8929 Other chronic pain: Secondary | ICD-10-CM | POA: Diagnosis not present

## 2014-10-30 DIAGNOSIS — M549 Dorsalgia, unspecified: Secondary | ICD-10-CM | POA: Insufficient documentation

## 2014-10-30 DIAGNOSIS — O9989 Other specified diseases and conditions complicating pregnancy, childbirth and the puerperium: Secondary | ICD-10-CM | POA: Insufficient documentation

## 2014-10-30 DIAGNOSIS — F419 Anxiety disorder, unspecified: Secondary | ICD-10-CM | POA: Insufficient documentation

## 2014-10-30 DIAGNOSIS — Z3A36 36 weeks gestation of pregnancy: Secondary | ICD-10-CM | POA: Diagnosis not present

## 2014-10-30 DIAGNOSIS — O99343 Other mental disorders complicating pregnancy, third trimester: Secondary | ICD-10-CM | POA: Insufficient documentation

## 2014-10-30 LAB — URINALYSIS, ROUTINE W REFLEX MICROSCOPIC
Bilirubin Urine: NEGATIVE
Glucose, UA: NEGATIVE mg/dL
Hgb urine dipstick: NEGATIVE
Ketones, ur: 15 mg/dL — AB
Nitrite: NEGATIVE
Protein, ur: NEGATIVE mg/dL
Specific Gravity, Urine: 1.02 (ref 1.005–1.030)
Urobilinogen, UA: >8 mg/dL — ABNORMAL HIGH (ref 0.0–1.0)
pH: 7 (ref 5.0–8.0)

## 2014-10-30 LAB — URINE MICROSCOPIC-ADD ON

## 2014-10-30 LAB — WET PREP, GENITAL
Clue Cells Wet Prep HPF POC: NONE SEEN
Trich, Wet Prep: NONE SEEN
Yeast Wet Prep HPF POC: NONE SEEN

## 2014-10-30 MED ORDER — ACETAMINOPHEN 325 MG PO TABS
650.0000 mg | ORAL_TABLET | Freq: Once | ORAL | Status: AC
  Administered 2014-10-30: 650 mg via ORAL
  Filled 2014-10-30: qty 2

## 2014-10-30 MED ORDER — HYDROXYZINE PAMOATE 50 MG PO CAPS: 50.0000 mg | ORAL_CAPSULE | Freq: Four times a day (QID) | ORAL | Status: DC | PRN

## 2014-10-30 MED ORDER — ACETAMINOPHEN 500 MG PO TABS: 1000.0000 mg | ORAL_TABLET | Freq: Once | ORAL | Status: DC

## 2014-10-30 NOTE — Discharge Instructions (Signed)
Third Trimester of Pregnancy The third trimester is from week 29 through week 42, months 7 through 9. This trimester is when your unborn baby (fetus) is growing very fast. At the end of the ninth month, the unborn baby is about 20 inches in length. It weighs about 6-10 pounds.  HOME CARE   Avoid all smoking, herbs, and alcohol. Avoid drugs not approved by your doctor.  Only take medicine as told by your doctor. Some medicines are safe and some are not during pregnancy.  Exercise only as told by your doctor. Stop exercising if you start having cramps.  Eat regular, healthy meals.  Wear a good support bra if your breasts are tender.  Do not use hot tubs, steam rooms, or saunas.  Wear your seat belt when driving.  Avoid raw meat, uncooked cheese, and liter boxes and soil used by cats.  Take your prenatal vitamins.  Try taking medicine that helps you poop (stool softener) as needed, and if your doctor approves. Eat more fiber by eating fresh fruit, vegetables, and whole grains. Drink enough fluids to keep your pee (urine) clear or pale yellow.  Take warm water baths (sitz baths) to soothe pain or discomfort caused by hemorrhoids. Use hemorrhoid cream if your doctor approves.  If you have puffy, bulging veins (varicose veins), wear support hose. Raise (elevate) your feet for 15 minutes, 3-4 times a day. Limit salt in your diet.  Avoid heavy lifting, wear low heels, and sit up straight.  Rest with your legs raised if you have leg cramps or low back pain.  Visit your dentist if you have not gone during your pregnancy. Use a soft toothbrush to brush your teeth. Be gentle when you floss.  You can have sex (intercourse) unless your doctor tells you not to.  Do not travel far distances unless you must. Only do so with your doctor's approval.  Take prenatal classes.  Practice driving to the hospital.  Pack your hospital bag.  Prepare the baby's room.  Go to your doctor visits. GET  HELP IF:  You are not sure if you are in labor or if your water has broken.  You are dizzy.  You have mild cramps or pressure in your lower belly (abdominal).  You have a nagging pain in your belly area.  You continue to feel sick to your stomach (nauseous), throw up (vomit), or have watery poop (diarrhea).  You have bad smelling fluid coming from your vagina.  You have pain with peeing (urination). GET HELP RIGHT AWAY IF:   You have a fever.  You are leaking fluid from your vagina.  You are spotting or bleeding from your vagina.  You have severe belly cramping or pain.  You lose or gain weight rapidly.  You have trouble catching your breath and have chest pain.  You notice sudden or extreme puffiness (swelling) of your face, hands, ankles, feet, or legs.  You have not felt the baby move in over an hour.  You have severe headaches that do not go away with medicine.  You have vision changes. Document Released: 10/31/2009 Document Revised: 12/01/2012 Document Reviewed: 10/07/2012 ExitCare Patient Information 2015 [employer]. This information is not intended to replace advice given to you by your health care provider. Make sure you discuss any questions you have with your health care provider.  

## 2014-10-30 NOTE — MAU Provider Note (Signed)
History    Melanie Hancock is a 19 y.o. G4P1021 at 36.5wks who presents, after phone call, for contractions, back pain, anxiety, nausea, and panic attacks.  Patient reports that symptoms begin after awaking this afternoon around 1230 pm and have gotten progressively worse. Patient reports 2-3 bottles of water since waking up at 1230. Patient reports taking vicodin "earlier this morning," but it has not helped. Patient reports good fetal movement, denies LOF and VB. Patient reports last intake at 1230. Denies constipation, diarrhea, and issues with urination. Patient reports taking anti-anxiety medications over a year ago  Patient Active Problem List   Diagnosis Date Noted  . Anxiety 03/28/2014  . Chronic back pain 03/28/2014  . Anemia 05/26/2013  . Hx pyelonephritis 2014 05/26/2013    Chief Complaint  Patient presents with  . Contractions   HPI  OB History    Gravida Para Term Preterm AB TAB SAB Ectopic Multiple Living   4 1 1  2 1 1   1       Past Medical History  Diagnosis Date  . Infection     UTI  . UTI (lower urinary tract infection)   . Anxiety   . Anemia   . Pyelonephritis   . Chronic back pain     Past Surgical History  Procedure Laterality Date  . Induced abortion    . Dilation and curettage of uterus      Family History  Problem Relation Age of Onset  . Alcohol abuse Neg Hx     History  Substance Use Topics  . Smoking status: Never Smoker   . Smokeless tobacco: Never Used  . Alcohol Use: No    Allergies: No Known Allergies  Prescriptions prior to admission  Medication Sig Dispense Refill Last Dose  . HYDROcodone-acetaminophen (NORCO/VICODIN) 5-325 MG per tablet Take 1 tablet by mouth every 6 (six) hours as needed for moderate pain. 30 tablet 0 10/30/2014 at 0200  . Prenatal Vit-Fe Fumarate-FA (PRENATAL MULTIVITAMIN) TABS tablet Take 1 tablet by mouth daily.    10/29/2014 at Unknown time  . docusate sodium (COLACE) 100 MG capsule Take 1 capsule (100 mg  total) by mouth 2 (two) times daily. For 5 days.  Then take daily, prn. (Patient not taking: Reported on 10/27/2014) 30 capsule 1   . Iron, Ferrous Gluconate, 256 (28 FE) MG TABS Take 1 tablet by mouth 2 (two) times daily before a meal. (Patient not taking: Reported on 10/16/2014) 60 tablet 5   . nystatin-triamcinolone ointment (MYCOLOG) Apply 1 application topically 2 (two) times daily. (Patient not taking: Reported on 10/27/2014) 30 g 0   . ranitidine (ZANTAC) 150 MG tablet Take 1 tablet (150 mg total) by mouth 2 (two) times daily. (Patient not taking: Reported on 10/16/2014) 60 tablet 2     ROS  See HPI Above Physical Exam   Blood pressure 107/60, pulse 103, temperature 98.4 F (36.9 C), resp. rate 18, height 5\' 6"  (1.676 m), weight 173 lb (78.472 kg), last menstrual period 02/15/2014, unknown if currently breastfeeding.  Results for orders placed or performed during the hospital encounter of 10/30/14 (from the past 24 hour(s))  Urinalysis, Routine w reflex microscopic     Status: Abnormal   Collection Time: 10/30/14  3:50 PM  Result Value Ref Range   Color, Urine YELLOW YELLOW   APPearance CLEAR CLEAR   Specific Gravity, Urine 1.020 1.005 - 1.030   pH 7.0 5.0 - 8.0   Glucose, UA NEGATIVE NEGATIVE mg/dL  Hgb urine dipstick NEGATIVE NEGATIVE   Bilirubin Urine NEGATIVE NEGATIVE   Ketones, ur 15 (A) NEGATIVE mg/dL   Protein, ur NEGATIVE NEGATIVE mg/dL   Urobilinogen, UA >1.4 (H) 0.0 - 1.0 mg/dL   Nitrite NEGATIVE NEGATIVE   Leukocytes, UA TRACE (A) NEGATIVE  Urine microscopic-add on     Status: Abnormal   Collection Time: 10/30/14  3:50 PM  Result Value Ref Range   Squamous Epithelial / LPF FEW (A) RARE   WBC, UA 3-6 <3 WBC/hpf   RBC / HPF 0-2 <3 RBC/hpf   Bacteria, UA MANY (A) RARE  Wet prep, genital     Status: Abnormal   Collection Time: 10/30/14  4:15 PM  Result Value Ref Range   Yeast Wet Prep HPF POC NONE SEEN NONE SEEN   Trich, Wet Prep NONE SEEN NONE SEEN   Clue Cells Wet  Prep HPF POC NONE SEEN NONE SEEN   WBC, Wet Prep HPF POC FEW (A) NONE SEEN    Physical Exam  Vitals reviewed. Constitutional: She is oriented to person, place, and time. She appears well-developed and well-nourished.  HENT:  Head: Normocephalic and atraumatic.  Eyes: EOM are normal.  Cardiovascular: Normal rate, regular rhythm and normal heart sounds.   Respiratory: Effort normal and breath sounds normal.  GI: Soft. Bowel sounds are normal. There is no CVA tenderness.  Appears AGA  Musculoskeletal: Normal range of motion.  Right side lower back pain that is constant aching.   Neurological: She is alert and oriented to person, place, and time.  Skin: Skin is warm and dry.  Psychiatric: She has a normal mood and affect. Her behavior is normal.   FHR: 135 bpm, Mod Var, -Decels, +Accels UC: None graphed or palpated ED Course  Assessment: IUP at 36.5wks Cat I FT Back Pain  Plan: -PE as above -Pending Labs: Wet Prep, UA -Tylenol    Follow Up (1720) -UA returned--UC sent -Patient reports pain remains, no relief with tylenol -Discussed non-pharmacologic pain relief measures -Patient requests and given RX for anxiety -Vistaril  ordered -Labor Precautions -Keep appt as scheduled -Encouraged to call if any questions or concerns arise prior to next scheduled office visit.  -Discharged to home in good condition  Melanie Hancock CNM, MSN 10/30/2014 4:16 PM

## 2014-10-30 NOTE — MAU Note (Signed)
Pt reports she is having ctx in her back since this morning. Reports she is having increase clear discharge on her pad. Good fetal movement

## 2014-11-01 LAB — CULTURE, OB URINE: Colony Count: 35000

## 2014-11-03 ENCOUNTER — Telehealth: Payer: Self-pay | Admitting: Oncology

## 2014-11-03 NOTE — Telephone Encounter (Signed)
S/W PT IN REF TO NP APPT ON 11/04/14@1 :30 REFERRING EMA KULWA DX ANEMIA OF PREGNANCY

## 2014-11-04 ENCOUNTER — Encounter: Payer: Self-pay | Admitting: Oncology

## 2014-11-04 ENCOUNTER — Other Ambulatory Visit: Payer: Medicaid Other

## 2014-11-04 ENCOUNTER — Ambulatory Visit (HOSPITAL_BASED_OUTPATIENT_CLINIC_OR_DEPARTMENT_OTHER): Payer: Medicaid Other | Admitting: Oncology

## 2014-11-04 ENCOUNTER — Telehealth: Payer: Self-pay | Admitting: Oncology

## 2014-11-04 ENCOUNTER — Ambulatory Visit: Payer: Medicaid Other

## 2014-11-04 ENCOUNTER — Telehealth: Payer: Self-pay | Admitting: *Deleted

## 2014-11-04 VITALS — BP 106/56 | HR 88 | Temp 98.0°F | Resp 18 | Ht 66.0 in | Wt 172.8 lb

## 2014-11-04 DIAGNOSIS — D500 Iron deficiency anemia secondary to blood loss (chronic): Secondary | ICD-10-CM

## 2014-11-04 DIAGNOSIS — D509 Iron deficiency anemia, unspecified: Secondary | ICD-10-CM

## 2014-11-04 DIAGNOSIS — O99013 Anemia complicating pregnancy, third trimester: Secondary | ICD-10-CM

## 2014-11-04 NOTE — Progress Notes (Signed)
Checked in new patient with no issues. She didn't have insurance card/.

## 2014-11-04 NOTE — Telephone Encounter (Signed)
Per staff message and POF I have scheduled appts. Advised scheduler of appts, first appt needs to be beofre 12. JMW

## 2014-11-04 NOTE — Telephone Encounter (Signed)
Pt confirmed labs/ov per 03/17 POF, gave pt AVS and calendar... KJ, sent msg to add IV Iron one hour

## 2014-11-04 NOTE — Consult Note (Signed)
Reason for Referral: Iron deficiency anemia.   HPI: This is a 19 year old woman currently [redacted] weeks pregnant with iron deficiency anemia. She has been pregnant on 2 previous occasions and she gave birth previously via vaginal delivery without any complications. After the birth of her son, she was diagnosed with iron deficiency anemia and had been taking iron supplements sporadically and intermittently. She has not reported any major complications from it such as nausea or abdominal pain she did report constipation. She reports that she does not like to take pills in general and S1 and the reasons where she have not been compliant with it. Her most recent laboratory check on 10/19/2014 showed that her hemoglobin was 7.4 was 7.1 and a platelet count of 354. Her hemoglobin electrophoresis showed a hemoglobin A of 97.9. However I'll level was 11 with ferritin of 7. Clinically, she is doing reasonably well and she is looking forward to her due date on April 4. She reports some mild fatigue but no chest pain or dyspnea on exertion. She does not report any hematochezia, melena or vaginal bleeding. She has reported poor appetite but really no nausea or vomiting. She does report intermittent back pain. She does not report any headaches, blurry vision, double vision, syncope or seizures. She does not report an chest pain, palpitation, orthopnea or PND. She does not report any cough or wheezing. She does not report any abdominal distention or satiety. She does not report any urinary symptoms. Rest of her review of systems unremarkable.   Past Medical History  Diagnosis Date  . Infection     UTI  . UTI (lower urinary tract infection)   . Anxiety   . Anemia   . Pyelonephritis   . Chronic back pain   :  Past Surgical History  Procedure Laterality Date  . Induced abortion    . Dilation and curettage of uterus    :   Current outpatient prescriptions:  .  HYDROcodone-acetaminophen (NORCO/VICODIN) 5-325 MG per  tablet, Take 1 tablet by mouth every 6 (six) hours as needed for moderate pain., Disp: 30 tablet, Rfl: 0 .  hydrOXYzine (VISTARIL) 50 MG capsule, Take 1 capsule (50 mg total) by mouth every 6 (six) hours as needed for anxiety., Disp: 30 capsule, Rfl: 0 .  Iron, Ferrous Gluconate, 256 (28 FE) MG TABS, Take 1 tablet by mouth 2 (two) times daily before a meal., Disp: 60 tablet, Rfl: 5 .  Prenatal Multivit-Min-Fe-FA (PRENATAL VITAMINS PO), Take 1 tablet by mouth daily., Disp: , Rfl: :  No Known Allergies:  Family History  Problem Relation Age of Onset  . Alcohol abuse Neg Hx   :  History   Social History  . Marital Status: Single    Spouse Name: N/A  . Number of Children: N/A  . Years of Education: N/A   Occupational History  . Not on file.   Social History Main Topics  . Smoking status: Never Smoker   . Smokeless tobacco: Never Used  . Alcohol Use: No  . Drug Use: No  . Sexual Activity: Yes    Birth Control/ Protection: None   Other Topics Concern  . Not on file   Social History Narrative  :  Pertinent items are noted in HPI.  Exam: ECOG 0 Blood pressure 106/56, pulse 88, temperature 98 F (36.7 C), temperature source Oral, resp. rate 18, height  (1.676 m), weight 172 lb 12.8 oz (78.382 kg), last menstrual period 02/15/2014, SpO2 100 %, unknown if  currently breastfeeding. General appearance: alert and cooperative Head: Normocephalic, without obvious abnormality Throat: lips, mucosa, and tongue normal; teeth and gums normal Neck: no adenopathy Back: negative Resp: clear to auscultation bilaterally Chest wall: no tenderness GI: soft, non-tender; bowel sounds normal; no masses,  no organomegaly Extremities: extremities normal, atraumatic, no cyanosis or edema Pulses: 2+ and symmetric Skin: Skin color, texture, turgor normal. No rashes or lesions Lymph nodes: Cervical, supraclavicular, and axillary nodes normal.    Assessment and Plan:   19 year old woman with  iron deficiency anemia have been diagnosed previously and confirmed recently on 10/19/2014. Her hemoglobin is 7.4 with iron level of 11 and ferritin of 7. She still usually week pregnant with due date in April. Options of treatments were discussed with the patient today including continuing oral iron with better adherence versus intravenous iron versus packed red cell transfusion.  The risks and benefits of intravenous IV iron were discussed today and risk of prophylaxis is rather rare but given her pregnancy state and the need for fetal monitoring to do so by not be wise at this time. She is certainly asymptomatic and definitely able to take oral iron at this time and the risks are not justified at this time.  If hemoglobin drops further prior to her delivery, or she becomes symptomatic then packed red cell transfusion would be a safer option. [facility] with better fetal monitoring on hand.  I anticipate her iron deficiency will persist postpartum and I have scheduled her a quick follow-up after her delivery and scheduled IV iron in case she needs it.  All her questions were answered today to her satisfaction.

## 2014-11-04 NOTE — Progress Notes (Signed)
Please see consult note.  

## 2014-11-09 NOTE — Telephone Encounter (Signed)
Mailed updated schedule to pt confirming chemo added with labs/ov.... KJ

## 2014-11-12 ENCOUNTER — Inpatient Hospital Stay (HOSPITAL_COMMUNITY)
Admission: AD | Admit: 2014-11-12 | Discharge: 2014-11-12 | Disposition: A | Discharge status: Home / Self Care | Payer: Medicaid Other | Source: Ambulatory Visit | Attending: Obstetrics and Gynecology | Admitting: Obstetrics and Gynecology

## 2014-11-12 DIAGNOSIS — Z87440 Personal history of urinary (tract) infections: Secondary | ICD-10-CM | POA: Insufficient documentation

## 2014-11-12 DIAGNOSIS — O471 False labor at or after 37 completed weeks of gestation: Secondary | ICD-10-CM | POA: Diagnosis not present

## 2014-11-12 DIAGNOSIS — Z3A38 38 weeks gestation of pregnancy: Secondary | ICD-10-CM | POA: Diagnosis not present

## 2014-11-12 NOTE — Discharge Instructions (Signed)
Braxton Hicks Contractions °Contractions of the uterus can occur throughout pregnancy. Contractions are not always a sign that you are in labor.  °WHAT ARE BRAXTON HICKS CONTRACTIONS?  °Contractions that occur before labor are called Braxton Hicks contractions, or false labor. Toward the end of pregnancy (32-34 weeks), these contractions can develop more often and may become more forceful. This is not true labor because these contractions do not result in opening (dilatation) and thinning of the cervix. They are sometimes difficult to tell apart from true labor because these contractions can be forceful and people have different pain tolerances. You should not feel embarrassed if you go to the hospital with false labor. Sometimes, the only way to tell if you are in true labor is for your health care provider to look for changes in the cervix. °If there are no prenatal problems or other health problems associated with the pregnancy, it is completely safe to be sent home with false labor and await the onset of true labor. °HOW CAN YOU TELL THE DIFFERENCE BETWEEN TRUE AND FALSE LABOR? °False Labor °· The contractions of false labor are usually shorter and not as hard as those of true labor.   °· The contractions are usually irregular.   °· The contractions are often felt in the front of the lower abdomen and in the groin.   °· The contractions may go away when you walk around or change positions while lying down.   °· The contractions get weaker and are shorter lasting as time goes on.   °· The contractions do not usually become progressively stronger, regular, and closer together as with true labor.   °True Labor °· Contractions in true labor last 30-70 seconds, become very regular, usually become more intense, and increase in frequency.   °· The contractions do not go away with walking.   °· The discomfort is usually felt in the top of the uterus and spreads to the lower abdomen and low back.   °· True labor can be  determined by your health care provider with an exam. This will show that the cervix is dilating and getting thinner.   °WHAT TO REMEMBER °· Keep up with your usual exercises and follow other instructions given by your health care provider.   °· Take medicines as directed by your health care provider.   °· Keep your regular prenatal appointments.   °· Eat and drink lightly if you think you are going into labor.   °· If Braxton Hicks contractions are making you uncomfortable:   °¨ Change your position from lying down or resting to walking, or from walking to resting.   °¨ Sit and rest in a tub of warm water.   °¨ Drink 2-3 glasses of water. Dehydration may cause these contractions.   °¨ Do slow and deep breathing several times an hour.   °WHEN SHOULD I SEEK IMMEDIATE MEDICAL CARE? °Seek immediate medical care if: °· Your contractions become stronger, more regular, and closer together.   °· You have fluid leaking or gushing from your vagina.   °· You have a fever.   °· You pass blood-tinged mucus.   °· You have vaginal bleeding.   °· You have continuous abdominal pain.   °· You have low back pain that you never had before.   °· You feel your baby's head pushing down and causing pelvic pressure.   °· Your baby is not moving as much as it used to.   °Document Released: 08/06/2005 Document Revised: 08/11/2013 Document Reviewed: 05/18/2013 °[employer]® Patient Information ©2015 [employer]. This information is not intended to replace advice given to you by your health care   provider. Make sure you discuss any questions you have with your health care provider. ° °

## 2014-11-12 NOTE — MAU Note (Signed)
Pt reports contractions all day, also reports for the last 2 hours every time she has a contraction she feels like something is leaking out.

## 2014-11-12 NOTE — MAU Provider Note (Signed)
History  19 yo G4P1021 @ 38.4 wks presents unannounced to MAU w/ c/o ctxs all day with leaking of fluid with each ctx. Reports good FM. Denies VB.  Patient Active Problem List   Diagnosis Date Noted  . Anxiety 03/28/2014  . Chronic back pain 03/28/2014  . Anemia 05/26/2013  . Hx pyelonephritis 2014 05/26/2013    No chief complaint on file.  HPI As above OB History    Gravida Para Term Preterm AB TAB SAB Ectopic Multiple Living   4 1 1  2 1 1   1       Past Medical History  Diagnosis Date  . Infection     UTI  . UTI (lower urinary tract infection)   . Anxiety   . Anemia   . Pyelonephritis   . Chronic back pain     Past Surgical History  Procedure Laterality Date  . Induced abortion    . Dilation and curettage of uterus      Family History  Problem Relation Age of Onset  . Alcohol abuse Neg Hx     History  Substance Use Topics  . Smoking status: Never Smoker   . Smokeless tobacco: Never Used  . Alcohol Use: No    Allergies: No Known Allergies  Prescriptions prior to admission  Medication Sig Dispense Refill Last Dose  . HYDROcodone-acetaminophen (NORCO/VICODIN) 5-325 MG per tablet Take 1 tablet by mouth every 6 (six) hours as needed for moderate pain. 30 tablet 0 11/11/2014 at Unknown time  . hydrOXYzine (VISTARIL) 50 MG capsule Take 1 capsule (50 mg total) by mouth every 6 (six) hours as needed for anxiety. 30 capsule 0 Past Week at Unknown time  . Prenatal Multivit-Min-Fe-FA (PRENATAL VITAMINS PO) Take 1 tablet by mouth daily.   11/11/2014 at Unknown time  . Iron, Ferrous Gluconate, 256 (28 FE) MG TABS Take 1 tablet by mouth 2 (two) times daily before a meal. 60 tablet 5 More than a month at Unknown time    ROS  Contractions +FM ? ROM -VB Physical Exam   Blood pressure 116/60, pulse 76, temperature 98 F (36.7 C), temperature source Oral, resp. rate 18, height 5\' 6"  (1.676 m), weight 176 lb (79.833 kg), last menstrual period 02/15/2014, SpO2 100 %,  unknown if currently breastfeeding.    Physical Exam Gen: NAD Abdomen: gravid, soft, NT Pelvic: SSE: Neg pooling, neg valsalva, neg fern. Cvx 1/50/-3 FHRT: Cat 1 U/C's: Rare Ext: WNL ED Course  Assessment: False labor Neg ROM  Plan: Labor precautions OB f/u as previously scheduled   Sherre ScarletWILLIAMS, Adelaido Nicklaus CNM, MS 11/12/2014 1:33 AM

## 2014-11-12 NOTE — MAU Note (Signed)
PT  SAYS SHE FELT LEAKING  AT 3PM-CLEAR-    THEN  CONTINUED.   AND FEELS  FLUID COME OUT  WITH  UC.   STARTED UC-  SINCE THIS AM.    WAS IN OFFICE LAST WEEK -     NO VE.  DENIES HSV AND MRSA.  GBS- NEG.

## 2014-11-12 NOTE — MAU Note (Signed)
GONE  TO CAR  TO GET CHARGER

## 2014-11-19 ENCOUNTER — Encounter (HOSPITAL_COMMUNITY): Payer: Self-pay | Admitting: *Deleted

## 2014-11-19 ENCOUNTER — Inpatient Hospital Stay (HOSPITAL_COMMUNITY)
Admission: AD | Admit: 2014-11-19 | Discharge: 2014-11-19 | Disposition: A | Discharge status: Home / Self Care | Payer: Medicaid Other | Source: Ambulatory Visit | Attending: Obstetrics and Gynecology | Admitting: Obstetrics and Gynecology

## 2014-11-19 LAB — AMNISURE RUPTURE OF MEMBRANE (ROM) NOT AT ARMC: Amnisure ROM: NEGATIVE

## 2014-11-19 NOTE — MAU Note (Signed)
Pt presents to MAU with complaints of contractions since 4am. Reports some leakage of fluid when she stood up from getting out of the tub this morning

## 2014-11-19 NOTE — Discharge Instructions (Signed)
Braxton Hicks Contractions °Contractions of the uterus can occur throughout pregnancy. Contractions are not always a sign that you are in labor.  °WHAT ARE BRAXTON HICKS CONTRACTIONS?  °Contractions that occur before labor are called Braxton Hicks contractions, or false labor. Toward the end of pregnancy (32-34 weeks), these contractions can develop more often and may become more forceful. This is not true labor because these contractions do not result in opening (dilatation) and thinning of the cervix. They are sometimes difficult to tell apart from true labor because these contractions can be forceful and people have different pain tolerances. You should not feel embarrassed if you go to the hospital with false labor. Sometimes, the only way to tell if you are in true labor is for your health care provider to look for changes in the cervix. °If there are no prenatal problems or other health problems associated with the pregnancy, it is completely safe to be sent home with false labor and await the onset of true labor. °HOW CAN YOU TELL THE DIFFERENCE BETWEEN TRUE AND FALSE LABOR? °False Labor °· The contractions of false labor are usually shorter and not as hard as those of true labor.   °· The contractions are usually irregular.   °· The contractions are often felt in the front of the lower abdomen and in the groin.   °· The contractions may go away when you walk around or change positions while lying down.   °· The contractions get weaker and are shorter lasting as time goes on.   °· The contractions do not usually become progressively stronger, regular, and closer together as with true labor.   °True Labor °· Contractions in true labor last 30-70 seconds, become very regular, usually become more intense, and increase in frequency.   °· The contractions do not go away with walking.   °· The discomfort is usually felt in the top of the uterus and spreads to the lower abdomen and low back.   °· True labor can be  determined by your health care provider with an exam. This will show that the cervix is dilating and getting thinner.   °WHAT TO REMEMBER °· Keep up with your usual exercises and follow other instructions given by your health care provider.   °· Take medicines as directed by your health care provider.   °· Keep your regular prenatal appointments.   °· Eat and drink lightly if you think you are going into labor.   °· If Braxton Hicks contractions are making you uncomfortable:   °¨ Change your position from lying down or resting to walking, or from walking to resting.   °¨ Sit and rest in a tub of warm water.   °¨ Drink 2-3 glasses of water. Dehydration may cause these contractions.   °¨ Do slow and deep breathing several times an hour.   °WHEN SHOULD I SEEK IMMEDIATE MEDICAL CARE? °Seek immediate medical care if: °· Your contractions become stronger, more regular, and closer together.   °· You have fluid leaking or gushing from your vagina.   °· You have a fever.   °· You pass blood-tinged mucus.   °· You have vaginal bleeding.   °· You have continuous abdominal pain.   °· You have low back pain that you never had before.   °· You feel your baby's head pushing down and causing pelvic pressure.   °· Your baby is not moving as much as it used to.   °Document Released: 08/06/2005 Document Revised: 08/11/2013 Document Reviewed: 05/18/2013 °[employer]® Patient Information ©2015 [employer]. This information is not intended to replace advice given to you by your health care   provider. Make sure you discuss any questions you have with your health care provider. ° °

## 2014-11-19 NOTE — MAU Provider Note (Signed)
History   19 yo G4P1021 at 6639 4/7 weeks presented unannounced c/o increased UCs since 4am.  Spoke with CNM on call at that time, instructed to continue to observe for increased intensity and frequency.  ? Leaking, but no active leaking at present.  Patient Active Problem List   Diagnosis Date Noted  . False labor after 37 weeks of gestation without delivery 11/12/2014  . Anxiety 03/28/2014  . Chronic back pain 03/28/2014  . Anemia 05/26/2013  . Hx pyelonephritis 2014 05/26/2013    Chief Complaint  Patient presents with  . Contractions   HPI:  See above  OB History    Gravida Para Term Preterm AB TAB SAB Ectopic Multiple Living   4 1 1  2 1 1   1       Past Medical History  Diagnosis Date  . Infection     UTI  . UTI (lower urinary tract infection)   . Anxiety   . Anemia   . Pyelonephritis   . Chronic back pain     Past Surgical History  Procedure Laterality Date  . Induced abortion    . Dilation and curettage of uterus      Family History  Problem Relation Age of Onset  . Alcohol abuse Neg Hx     History  Substance Use Topics  . Smoking status: Never Smoker   . Smokeless tobacco: Never Used  . Alcohol Use: No    Allergies: No Known Allergies  Prescriptions prior to admission  Medication Sig Dispense Refill Last Dose  . HYDROcodone-acetaminophen (NORCO/VICODIN) 5-325 MG per tablet Take 1 tablet by mouth every 6 (six) hours as needed for moderate pain. 30 tablet 0 11/11/2014 at Unknown time  . hydrOXYzine (VISTARIL) 50 MG capsule Take 1 capsule (50 mg total) by mouth every 6 (six) hours as needed for anxiety. 30 capsule 0 Past Week at Unknown time  . Iron, Ferrous Gluconate, 256 (28 FE) MG TABS Take 1 tablet by mouth 2 (two) times daily before a meal. 60 tablet 5 More than a month at Unknown time  . Prenatal Multivit-Min-Fe-FA (PRENATAL VITAMINS PO) Take 1 tablet by mouth daily.   11/11/2014 at Unknown time    ROS:  Contractions, +FM Physical Exam   Blood  pressure 119/56, pulse 80, temperature 98.3 F (36.8 C), resp. rate 20, last menstrual period 02/15/2014, unknown if currently breastfeeding.   Physical Exam  In NAD Chest clear Heart RRR without murmur Abd gravid, NT Pelvic--cervix posterior, 1 cm, 50%, vtx, -2 (no change from exam 11/12/14) Ext WNL  FHR Category 1 UCs irregular, q 2-6 min  ED Course  Assessment: IUP at 39 4/7 weeks Early vs. Prodromal labor GBS positive  Plan: Observe and ambulate. Recheck in 1-2 hours.   Nigel BridgemanLATHAM, Melanie Hancock CNM, MSN 11/19/2014 12:33 PM  Addendum: Returned from walking.  UCs same.  FHR Category 1 UCs irregular in pattern and quality--q 2-6 min, patient not uncomfortable. Cervix still very posterior, loose 1 cm, 50%, vtx, -2.  Amnisure done for verification of membrane status = NEGATIVE  Impression: IUP at 39 4/7 weeks Likely prodromal labor GBS positive  Plan: D/C home with labor precautions F/u as needed or as scheduled on 11/23/14 at office.  Nigel BridgemanVicki Jacorion Hancock, CNM 11/19/14 3p

## 2014-11-20 ENCOUNTER — Encounter (HOSPITAL_COMMUNITY): Payer: Self-pay | Admitting: *Deleted

## 2014-11-20 ENCOUNTER — Inpatient Hospital Stay (HOSPITAL_COMMUNITY)
Admission: AD | Admit: 2014-11-20 | Discharge: 2014-11-22 | DRG: 775 | Disposition: A | Discharge status: Home / Self Care | Payer: Medicaid Other | Source: Ambulatory Visit | Attending: Obstetrics and Gynecology | Admitting: Obstetrics and Gynecology

## 2014-11-20 DIAGNOSIS — Z3A39 39 weeks gestation of pregnancy: Secondary | ICD-10-CM | POA: Diagnosis present

## 2014-11-20 DIAGNOSIS — F419 Anxiety disorder, unspecified: Secondary | ICD-10-CM | POA: Diagnosis present

## 2014-11-20 DIAGNOSIS — D649 Anemia, unspecified: Secondary | ICD-10-CM | POA: Diagnosis present

## 2014-11-20 DIAGNOSIS — O9989 Other specified diseases and conditions complicating pregnancy, childbirth and the puerperium: Secondary | ICD-10-CM | POA: Diagnosis present

## 2014-11-20 DIAGNOSIS — O99344 Other mental disorders complicating childbirth: Secondary | ICD-10-CM | POA: Diagnosis present

## 2014-11-20 DIAGNOSIS — O9902 Anemia complicating childbirth: Secondary | ICD-10-CM | POA: Diagnosis present

## 2014-11-20 DIAGNOSIS — O99824 Streptococcus B carrier state complicating childbirth: Secondary | ICD-10-CM | POA: Diagnosis present

## 2014-11-20 LAB — CBC
HCT: 26.9 % — ABNORMAL LOW (ref 36.0–46.0)
Hemoglobin: 7.9 g/dL — ABNORMAL LOW (ref 12.0–15.0)
MCH: 19.5 pg — ABNORMAL LOW (ref 26.0–34.0)
MCHC: 29.4 g/dL — ABNORMAL LOW (ref 30.0–36.0)
MCV: 66.3 fL — ABNORMAL LOW (ref 78.0–100.0)
Platelets: 361 10*3/uL (ref 150–400)
RBC: 4.06 MIL/uL (ref 3.87–5.11)
RDW: 18 % — ABNORMAL HIGH (ref 11.5–15.5)
WBC: 15.5 10*3/uL — ABNORMAL HIGH (ref 4.0–10.5)

## 2014-11-20 MED ORDER — DIPHENHYDRAMINE HCL 25 MG PO CAPS: 25.0000 mg | ORAL_CAPSULE | Freq: Four times a day (QID) | ORAL | Status: DC | PRN

## 2014-11-20 MED ORDER — DIPHENHYDRAMINE HCL 50 MG/ML IJ SOLN: 12.5000 mg | INTRAMUSCULAR | Status: DC | PRN

## 2014-11-20 MED ORDER — IBUPROFEN 600 MG PO TABS
600.0000 mg | ORAL_TABLET | Freq: Four times a day (QID) | ORAL | Status: DC
  Administered 2014-11-20 – 2014-11-22 (×6): 600 mg via ORAL
  Filled 2014-11-20 (×8): qty 1

## 2014-11-20 MED ORDER — OXYTOCIN BOLUS FROM INFUSION: 500.0000 mL | INTRAVENOUS | Status: DC

## 2014-11-20 MED ORDER — PHENYLEPHRINE 40 MCG/ML (10ML) SYRINGE FOR IV PUSH (FOR BLOOD PRESSURE SUPPORT)
PREFILLED_SYRINGE | INTRAVENOUS | Status: AC
  Filled 2014-11-20: qty 20

## 2014-11-20 MED ORDER — PRENATAL MULTIVITAMIN CH
1.0000 | ORAL_TABLET | Freq: Every day | ORAL | Status: DC
  Administered 2014-11-21 – 2014-11-22 (×2): 1 via ORAL
  Filled 2014-11-20 (×2): qty 1

## 2014-11-20 MED ORDER — SIMETHICONE 80 MG PO CHEW
80.0000 mg | CHEWABLE_TABLET | ORAL | Status: DC | PRN
Start: 2014-11-20 — End: 2014-11-22

## 2014-11-20 MED ORDER — ONDANSETRON HCL 4 MG/2ML IJ SOLN: 4.0000 mg | INTRAMUSCULAR | Status: DC | PRN

## 2014-11-20 MED ORDER — PHENYLEPHRINE 40 MCG/ML (10ML) SYRINGE FOR IV PUSH (FOR BLOOD PRESSURE SUPPORT)
80.0000 ug | PREFILLED_SYRINGE | INTRAVENOUS | Status: DC | PRN
  Filled 2014-11-20: qty 2

## 2014-11-20 MED ORDER — LACTATED RINGERS IV SOLN: 500.0000 mL | Freq: Once | INTRAVENOUS | Status: DC

## 2014-11-20 MED ORDER — ONDANSETRON HCL 4 MG/2ML IJ SOLN: 4.0000 mg | Freq: Four times a day (QID) | INTRAMUSCULAR | Status: DC | PRN

## 2014-11-20 MED ORDER — OXYCODONE-ACETAMINOPHEN 5-325 MG PO TABS: 2.0000 | ORAL_TABLET | ORAL | Status: DC | PRN

## 2014-11-20 MED ORDER — OXYCODONE-ACETAMINOPHEN 5-325 MG PO TABS
1.0000 | ORAL_TABLET | ORAL | Status: DC | PRN
  Administered 2014-11-21 – 2014-11-22 (×4): 1 via ORAL
  Filled 2014-11-20 (×4): qty 1

## 2014-11-20 MED ORDER — EPHEDRINE 5 MG/ML INJ
10.0000 mg | INTRAVENOUS | Status: DC | PRN
  Filled 2014-11-20: qty 2

## 2014-11-20 MED ORDER — WITCH HAZEL-GLYCERIN EX PADS: 1.0000 "application " | MEDICATED_PAD | CUTANEOUS | Status: DC | PRN

## 2014-11-20 MED ORDER — MISOPROSTOL 200 MCG PO TABS
1000.0000 ug | ORAL_TABLET | Freq: Once | ORAL | Status: DC
Start: 2014-11-20 — End: 2014-11-20

## 2014-11-20 MED ORDER — FENTANYL 2.5 MCG/ML BUPIVACAINE 1/10 % EPIDURAL INFUSION (WH - ANES): 14.0000 mL/h | INTRAMUSCULAR | Status: DC | PRN

## 2014-11-20 MED ORDER — ACETAMINOPHEN 325 MG PO TABS: 650.0000 mg | ORAL_TABLET | ORAL | Status: DC | PRN

## 2014-11-20 MED ORDER — SODIUM CHLORIDE 0.9 % IV SOLN
2.0000 g | Freq: Once | INTRAVENOUS | Status: DC
  Filled 2014-11-20: qty 2000

## 2014-11-20 MED ORDER — NALBUPHINE HCL 10 MG/ML IJ SOLN: 5.0000 mg | INTRAMUSCULAR | Status: DC | PRN

## 2014-11-20 MED ORDER — LACTATED RINGERS IV SOLN: INTRAVENOUS | Status: DC

## 2014-11-20 MED ORDER — OXYCODONE-ACETAMINOPHEN 5-325 MG PO TABS
1.0000 | ORAL_TABLET | ORAL | Status: DC | PRN
  Administered 2014-11-20: 1 via ORAL
  Filled 2014-11-20: qty 1

## 2014-11-20 MED ORDER — LIDOCAINE HCL (PF) 1 % IJ SOLN
30.0000 mL | INTRAMUSCULAR | Status: DC | PRN
  Filled 2014-11-20: qty 30

## 2014-11-20 MED ORDER — DIBUCAINE 1 % RE OINT: 1.0000 "application " | TOPICAL_OINTMENT | RECTAL | Status: DC | PRN

## 2014-11-20 MED ORDER — OXYTOCIN 40 UNITS IN LACTATED RINGERS INFUSION - SIMPLE MED
62.5000 mL/h | INTRAVENOUS | Status: DC
  Administered 2014-11-20: 62.5 mL/h via INTRAVENOUS
  Filled 2014-11-20: qty 1000

## 2014-11-20 MED ORDER — LANOLIN HYDROUS EX OINT: TOPICAL_OINTMENT | CUTANEOUS | Status: DC | PRN

## 2014-11-20 MED ORDER — LACTATED RINGERS IV SOLN: 500.0000 mL | INTRAVENOUS | Status: DC | PRN

## 2014-11-20 MED ORDER — ZOLPIDEM TARTRATE 5 MG PO TABS: 5.0000 mg | ORAL_TABLET | Freq: Every evening | ORAL | Status: DC | PRN

## 2014-11-20 MED ORDER — FENTANYL 2.5 MCG/ML BUPIVACAINE 1/10 % EPIDURAL INFUSION (WH - ANES)
INTRAMUSCULAR | Status: DC
Start: 2014-11-20 — End: 2014-11-20
  Filled 2014-11-20: qty 125

## 2014-11-20 MED ORDER — BENZOCAINE-MENTHOL 20-0.5 % EX AERO
1.0000 "application " | INHALATION_SPRAY | CUTANEOUS | Status: DC | PRN
  Administered 2014-11-20: 1 via TOPICAL
  Filled 2014-11-20: qty 56

## 2014-11-20 MED ORDER — CITRIC ACID-SODIUM CITRATE 334-500 MG/5ML PO SOLN: 30.0000 mL | ORAL | Status: DC | PRN

## 2014-11-20 MED ORDER — SENNOSIDES-DOCUSATE SODIUM 8.6-50 MG PO TABS
2.0000 | ORAL_TABLET | ORAL | Status: DC
  Administered 2014-11-20 – 2014-11-21 (×2): 2 via ORAL
  Filled 2014-11-20 (×2): qty 2

## 2014-11-20 MED ORDER — ONDANSETRON HCL 4 MG PO TABS: 4.0000 mg | ORAL_TABLET | ORAL | Status: DC | PRN

## 2014-11-20 MED ORDER — TETANUS-DIPHTH-ACELL PERTUSSIS 5-2.5-18.5 LF-MCG/0.5 IM SUSP: 0.5000 mL | Freq: Once | INTRAMUSCULAR | Status: DC

## 2014-11-20 NOTE — MAU Note (Signed)
C/o ctx since yesterday. Got stronger around 1400. Denies SROM or bleeding at this time

## 2014-11-20 NOTE — Progress Notes (Addendum)
Addendum VE after transfer 6/80/-2 ABX changed to Amp Pt requesting an epidural

## 2014-11-20 NOTE — H&P (Signed)
Melanie Hancock is a 19 y.o. female, G4 P1021 at 39.5 weeks  Patient Active Problem List   Diagnosis Date Noted  . GBS (group B Streptococcus carrier), +RV culture, currently pregnant 11/19/2014  . False labor after 37 weeks of gestation without delivery 11/12/2014  . Anxiety 03/28/2014  . Chronic back pain 03/28/2014  . Anemia--7.1 early March 05/26/2013  . Hx pyelonephritis 2014 05/26/2013    Pregnancy Course: Patient entered care at 10.3 weeks.   EDC of 4/4*/16 was established by US.      US evaluations:   *10.4 weeks - EDD 11/20/14, FHR 168,   *16 weeks - FHR 158,  Cervical length 3.92,   *18.3 weeks - EFW 90z - 59.2%, cervical length 4.40,    Significant prenatal events:   GBS+, anemia hgb 9.1 04/27/14,  Last evaluation:   39.4 weeks   VE:1/50/-2 on 11/19/14, FHR 150, breech,   Reason for admission:  SROM GBS+, Labor  Pt States:   Contractions Frequency: 3-5         Contraction severity: strong         Fetal activity: +FM  OB History    Gravida Para Term Preterm AB TAB SAB Ectopic Multiple Living   4 1 1  2 1 1   1      Past Medical History  Diagnosis Date  . Infection     UTI  . UTI (lower urinary tract infection)   . Anxiety   . Anemia   . Pyelonephritis   . Chronic back pain    Past Surgical History  Procedure Laterality Date  . Induced abortion    . Dilation and curettage of uterus     Family History: family history is negative for Alcohol abuse. Social History:  reports that she has never smoked. She has never used smokeless tobacco. She reports that she does not drink alcohol or use illicit drugs.   Prenatal Transfer Tool  Maternal Diabetes: No Genetic Screening: Declined Maternal Ultrasounds/Referrals: Normal Fetal Ultrasounds or other Referrals:  None Maternal Substance Abuse:  No Significant Maternal Medications:  None Significant Maternal Lab Results: Lab values include: Group B Strep positive   ROS:  See HPI above, all other systems are  negative  No Known Allergies  Dilation: 2 Station: -2 Exam by:: V.Latham,CNM Blood pressure 114/54, pulse 81, temperature 97.8 F (36.6 C), temperature source Oral, resp. rate 18, last menstrual period 02/15/2014, unknown if currently breastfeeding.  Maternal Exam:  Uterine Assessment: Contraction frequency is rare.  Abdomen: Gravid, non tender. Fundal height is aga.  Normal external genitalia, vulva, cervix, uterus and adnexa.  No lesions noted on exam.  Pelvis adequate for delivery.  Fetal presentation: Vertex by VE  Fetal Exam:  Monitor Surveillance : Continuous Monitoring Mode: Ultrasound.  NICHD: Category CTXs: Q 3-725minutes EFW   7 lbs  Physical Exam: Nursing note and vitals reviewed General: alert and cooperative She appears well nourished Psychiatric: Normal mood and affect. Her behavior is normal Head: Normocephalic Eyes: Pupils are equal, round, and reactive to light Neck: Normal range of motion Cardiovascular: RRR without murmur  Respiratory: CTAB. Effort normal  Abd: soft, non-tender, +BS, no rebound, no guarding  Genitourinary: Vagina normal  Neurological: A&Ox3 Skin: Warm and dry  Musculoskeletal: Normal range of motion  Homan's sign negative bilaterally No evidence of DVTs.  Edema: Minimal bilaterally non-pitting edema DTR: 2+ Clonus: None   Prenatal labs: ABO, Rh:  A positive Antibody:  negative Rubella:   immune RPR:  NON REAC (07/31 1519)  HBsAg:   neg HIV: NONREACTIVE (07/31 1519)  GBS:  positive Sickle cell/Hgb electrophoresis:  WNL Pap:  negative GC: negative  Chlamydia: negative Genetic screenings:   Glucola:  wnl  Assessment:  IUP at 39.5 weeks NICHD: Category 2 Membranes: SROM 1957 GBS positive  Plan:  Admit to L&D for expectant/active management of labor. Possible augmentation options reviewed including foley bulb, AROM and/or pitocin.  GBS prophylaxis with PCN G per Titus Drone dosing with onset of active labor.  IV pain  medication per orders PRN Epidural per patient request Foley cath after patient is comfortable with epidural Anticipate SVD  Labor mgmt as ordered  Attending MD available at all times.    Sahand Gosch, CNM, MSN 11/20/2014, 8:07 PM       All information will be confirmed upon admisson

## 2014-11-20 NOTE — MAU Provider Note (Signed)
MAU Addendum Note  SROM 1955,, light mec GBS+   Plan: Admit to L&D   Naavya Postma, CNM, MSN 11/20/2014. 7:54 PM

## 2014-11-20 NOTE — MAU Provider Note (Signed)
Melanie Hancock is a 19 y.o. G4 P1021 at 39.5 weeks ctx ctx q 2 minutes lasting 30 second   History     Patient Active Problem List   Diagnosis Date Noted  . GBS (group B Streptococcus carrier), +RV culture, currently pregnant 11/19/2014  . False labor after 37 weeks of gestation without delivery 11/12/2014  . Anxiety 03/28/2014  . Chronic back pain 03/28/2014  . Anemia--7.1 early March 05/26/2013  . Hx pyelonephritis 2014 05/26/2013    No chief complaint on file.  HPI  OB History    Gravida Para Term Preterm AB TAB SAB Ectopic Multiple Living   4 1 1  2 1 1   1       Past Medical History  Diagnosis Date  . Infection     UTI  . UTI (lower urinary tract infection)   . Anxiety   . Anemia   . Pyelonephritis   . Chronic back pain     Past Surgical History  Procedure Laterality Date  . Induced abortion    . Dilation and curettage of uterus      Family History  Problem Relation Age of Onset  . Alcohol abuse Neg Hx     History  Substance Use Topics  . Smoking status: Never Smoker   . Smokeless tobacco: Never Used  . Alcohol Use: No    Allergies: No Known Allergies  Prescriptions prior to admission  Medication Sig Dispense Refill Last Dose  . HYDROcodone-acetaminophen (NORCO/VICODIN) 5-325 MG per tablet Take 1 tablet by mouth every 6 (six) hours as needed for moderate pain. 30 tablet 0 Past Week at Unknown time  . Iron, Ferrous Gluconate, 256 (28 FE) MG TABS Take 1 tablet by mouth 2 (two) times daily before a meal. 60 tablet 5 More than a month at Unknown time  . Prenatal Multivit-Min-Fe-FA (PRENATAL VITAMINS PO) Take 1 tablet by mouth daily.   11/18/2014 at Unknown time  . Zolpidem Tartrate (AMBIEN PO) Take 1 tablet by mouth at bedtime.   Past Week at Unknown time    ROS See HPI above, all other systems are negative  Physical Exam   Last menstrual period 02/15/2014, unknown if currently breastfeeding.  Physical Exam Ext:  WNL ABD: Soft, non tender to  palpation, no rebound or guarding SVE:2/50/-2   ED Course  Assessment: IUP at  39.5weeks Membranes: intact FHR: 140, moderate variability, + accel, occasional variable decel  CTX:  3-5 minutes   Plan:  monitor for 1 hour and recheck   Anayely Constantine, CNM, MSN 11/20/2014. 6:12 PM

## 2014-11-21 LAB — CBC
HCT: 23.4 % — ABNORMAL LOW (ref 36.0–46.0)
Hemoglobin: 7 g/dL — ABNORMAL LOW (ref 12.0–15.0)
MCH: 19.8 pg — ABNORMAL LOW (ref 26.0–34.0)
MCHC: 29.9 g/dL — ABNORMAL LOW (ref 30.0–36.0)
MCV: 66.1 fL — ABNORMAL LOW (ref 78.0–100.0)
Platelets: 286 10*3/uL (ref 150–400)
RBC: 3.54 MIL/uL — ABNORMAL LOW (ref 3.87–5.11)
RDW: 17.8 % — ABNORMAL HIGH (ref 11.5–15.5)
WBC: 13.9 10*3/uL — ABNORMAL HIGH (ref 4.0–10.5)

## 2014-11-21 LAB — HIV ANTIBODY (ROUTINE TESTING W REFLEX): HIV Screen 4th Generation wRfx: NONREACTIVE

## 2014-11-21 LAB — RPR: RPR Ser Ql: NONREACTIVE

## 2014-11-21 MED ORDER — FERROUS SULFATE 325 (65 FE) MG PO TABS
325.0000 mg | ORAL_TABLET | Freq: Three times a day (TID) | ORAL | Status: DC
  Administered 2014-11-21 – 2014-11-22 (×5): 325 mg via ORAL
  Filled 2014-11-21 (×5): qty 1

## 2014-11-21 NOTE — Lactation Note (Signed)
This note was copied from the chart of Melanie Hancock [place]. Lactation Consultation Note  Patient Name: Melanie Hancock Righetti ZOXWR'UToday's Date: 11/21/2014 Reason for consult: Initial assessment;NICU baby   Initial consult with NICU mom at 19 hours post-delivery.  GA 39.5; BW 7#,3oz.  Infant in NICU to r/o sepsis (precipitious delivery, code apgar, incr.RR, thick MSF).  Mom states baby is receiving antibiotics.   Mom is a P2; experienced with breastfeeding first child (2yo) for 7 months.   Mom has been breastfeeding infant in NICU as his sole source of nutrition.  Infant has breastfed x6 (15-45 min) with LS-10 by NICU RN since birth 19 hours ago; voids documented -0; stools-1 +MSF since birth.     Mom reports pumping only once today (at 1445) and received 5 ml with first pumping using standard setting on symphony pump. LC showed mom how to use preemie setting with 3-4 teardrops.  Encouraged mom to use hands-on pumping with hand expression at end of pumping session.  Encouraged mom to pump after breastfeedings for NICU use after mom is discharged.  Discussed emptying breast (breastfeeding/pumping at least 8 times a day).     Visitors in room at time of visit, so LC was unable to review hand expression but mom stated she knew how to hand express and stated she hand expressed some after last breastfeeding.  Referred mom to hand expression website video in booklet.   Reviewed NICU booklet and milk storage / transportation guidelines.  Extra colostrum collection containers given with yellow dots for tops of containers to identify first pumpings.  Lactation brochure also given and informed mom of hospital support group and outpatient services as support for after baby is discharged.   [facility] with [facility]  WIC paperwork given to mom.  WIC Referral Faxed [facility] potential need [facility]    Maternal Data Formula Feeding for Exclusion: Yes Reason for  exclusion: Admission to Intensive Care Unit (ICU) post-partum Has patient been taught Hand Expression?:  (visitors in room at time of visit.  Pt reports knowing how to hand express) Does the patient have breastfeeding experience prior to this delivery?: Yes   Lactation Tools Discussed/Used WIC Program: Yes Pump Review: Setup, frequency, and cleaning;Milk Storage Initiated by:: RN  Date initiated:: 11/21/14   Consult Status Consult Status: Follow-up Date: 11/22/14 Follow-up type: In-patient    Lendon KaVann, Asani Mcburney Walker 11/21/2014, 4:04 PM

## 2014-11-21 NOTE — Progress Notes (Signed)
Subjective: Postpartum Day 1: Vaginal delivery, no laceration Patient up ad lib, reports no syncope or dizziness. Feeding:  Breast Contraceptive plan:  Undecided  Patient with hx of chronic anemia--had consult visit with Dr. Clelia CroftShadad, hematologist, on 3/17, with plan made for continuing Fe supplementation, elected to defer IV iron at that point.  Hgb 7.4 at that time.  Patient has been asymptomatic.  Objective: Vital signs in last 24 hours: Temp:  [97.5 F (36.4 C)-98.7 F (37.1 C)] 97.5 F (36.4 C) (04/03 0329) Pulse Rate:  [68-92] 78 (04/03 0329) Resp:  [18-20] 20 (04/03 0329) BP: (114-128)/(52-81) 115/65 mmHg (04/03 0329) SpO2:  [100 %] 100 % (04/03 0329) Weight:  [179 lb (81.194 kg)] 179 lb (81.194 kg) (04/02 2113)  Physical Exam:  General: alert Lochia: appropriate Uterine Fundus: firm Perineum: Intact DVT Evaluation: No evidence of DVT seen on physical exam. Negative Homan's sign.  CBC Latest Ref Rng 11/21/2014 11/20/2014 06/07/2014  WBC 4.0 - 10.5 K/uL 13.9(H) 15.5(H) 7.5  Hemoglobin 12.0 - 15.0 g/dL 7.0(L) 7.9(L) 8.9(L)  Hematocrit 36.0 - 46.0 % 23.4(L) 26.9(L) 28.2(L)  Platelets 150 - 400 K/uL 286 361 286     Assessment/Plan: Status post vaginal delivery day 1. Chronic anemia--has f/u appt with hematologist, Dr. Clelia CroftShadad, on 12/24/14 Orthostatic VS Fe BID Repeat CBC tomorrow. Continue current care.   Nyra CapesLATHAM, VICKICNM 11/21/2014, 7:52 AM

## 2014-11-22 ENCOUNTER — Telehealth: Payer: Self-pay | Admitting: Oncology

## 2014-11-22 LAB — CBC
HCT: 21.6 % — ABNORMAL LOW (ref 36.0–46.0)
Hemoglobin: 6.6 g/dL — CL (ref 12.0–15.0)
MCH: 20.3 pg — ABNORMAL LOW (ref 26.0–34.0)
MCHC: 30.6 g/dL (ref 30.0–36.0)
MCV: 66.5 fL — ABNORMAL LOW (ref 78.0–100.0)
Platelets: 259 10*3/uL (ref 150–400)
RBC: 3.25 MIL/uL — ABNORMAL LOW (ref 3.87–5.11)
RDW: 17.9 % — ABNORMAL HIGH (ref 11.5–15.5)
WBC: 8.9 10*3/uL (ref 4.0–10.5)

## 2014-11-22 MED ORDER — OXYCODONE-ACETAMINOPHEN 5-325 MG PO TABS: 1.0000 | ORAL_TABLET | ORAL | Status: DC | PRN

## 2014-11-22 MED ORDER — FERROUS GLUCONATE 324 (38 FE) MG PO TABS: 324.0000 mg | ORAL_TABLET | Freq: Two times a day (BID) | ORAL | Status: DC

## 2014-11-22 MED ORDER — FERROUS GLUCONATE 324 (38 FE) MG PO TABS: 324.0000 mg | ORAL_TABLET | Freq: Three times a day (TID) | ORAL | Status: DC

## 2014-11-22 MED ORDER — FERROUS SULFATE 325 (65 FE) MG PO TABS: 325.0000 mg | ORAL_TABLET | Freq: Three times a day (TID) | ORAL | Status: DC

## 2014-11-22 NOTE — Progress Notes (Signed)
Addendum At 1330 the primary nurse called to report pt c/o dizziness when she went to the NICU and is requesting a blood transfusion.  I went to see the patient, she confirmed feeling dizzy when standing Dr Sallye OberKulwa consulted.  PLan to order 2 units  At 1445 the primary nurse called to stay the pt changed her mind and is not refusing a blood transfusion.  She reports having an appointment with her hematologist this week.  Discharge order will stand as ordered

## 2014-11-22 NOTE — Progress Notes (Signed)
UR chart review completed.  

## 2014-11-22 NOTE — Telephone Encounter (Signed)
Appointments for 5/6 moved from LT to FS due to LT now on PAL - both AJ/LT now on PAL. Spoke with patient re new time for lab/FS/tx 5/6 @ 12:45pm.

## 2014-11-22 NOTE — Lactation Note (Signed)
This note was copied from the chart of Melanie Hancock. Lactation Consultation Note  Met with mom in the NICU.  Mom has been pumping and putting baby to breast.  She states her breasts are becoming full today.  Baby has started cluster feeding.  Encouraged her to continue post pumping to assist with establishing and maintaining milk supply.  Mom plans to get a pump from [facility].  She talked to them by phone this AM.  I told mom that she can bring her pieces and use NICU pumping rooms also.  Mom states she is going to receive a blood transfusion today.  As we were talking at infants bedside baby's oxygen saturation dropped requiring a brief period of oxygen.  Mom very upset and tearful.  Support given.  Will assist mom and baby as needed.  Patient Name: Melanie Jan Walters PHQNE'T Date: 11/22/2014     Maternal Data    Feeding Feeding Type: Breast Fed  LATCH Score/Interventions Intervention(s): Skin to skin  Intervention(s): Skin to skin           Hold (Positioning): No assistance needed to correctly position infant at breast.     Lactation Tools Discussed/Used     Consult Status      [name] 11/22/2014, 11:12 AM

## 2014-11-22 NOTE — Lactation Note (Signed)
This note was copied from the chart of Melanie Hancock. Lactation Consultation Note  Met with mom in the NICU to assist with feeding.  Milk dripping from breast.  Assisted mom with cross cradle hold.  Baby latched easily and nursed actively with many audible swallows.  Demonstrated breast massage to use during feeding to increase milk flow and intake.  Mom c/o of tender nipples.  Comfort gels given with instructions.  Encouraged to call for assist/concerns prn.  Patient Name: Melanie Hancock [id number] Date: 11/22/2014 Reason for consult: Follow-up assessment   Maternal Data    Feeding Feeding Type: Breast Fed  LATCH Score/Interventions Latch: Grasps breast easily, tongue down, lips flanged, rhythmical sucking. Intervention(s): Skin to skin;Teach feeding cues;Waking techniques  Audible Swallowing: Spontaneous and intermittent Intervention(s): Skin to skin;Hand expression;Alternate breast massage  Type of Nipple: Everted at rest and after stimulation  Comfort (Breast/Nipple): Soft / non-tender     Hold (Positioning): Assistance needed to correctly position infant at breast and maintain latch. Intervention(s): Breastfeeding basics reviewed;Support Pillows;Position options;Skin to skin  LATCH Score: 9  Lactation Tools Discussed/Used     Consult Status Consult Status: Follow-up    [name] 11/22/2014, 1:33 PM

## 2014-11-22 NOTE — Discharge Summary (Signed)
Vaginal Delivery Discharge Summary  ALL information will be verified prior to discharge  Melanie Hancock  DOB:    [birth year] MRN:    [id number] CSN:    [id number]  Date of admission:                  11/20/14  Date of discharge:                   11/22/14  Procedures this admission: SVD  Date of Delivery: 11/20/14  Newborn Data:  Newborn Data:  Live born  Information for the patient's newborn:  Stephanieann, Popescu [[id number]]  female   Live born female  Birth Weight: 7 lb 3 oz (3260 g) APGAR: 3, 6  Home with mother. Name: Bernerd Limbo Circumcision Plan: none   History of Present Illness: Melanie Hancock is a 19 y.o. female, [id number], who presents at [redacted]w[redacted]d weeks gestation. The patient has been followed at the [facility] and Gynecology division of [facility]. She was admitted rupture of membranes. Her pregnancy has been complicated by:  Patient Active Problem List   Diagnosis Date Noted  . Normal labor 11/20/2014  . Normal vaginal delivery 11/20/2014  . GBS (group B Streptococcus carrier), +RV culture, currently pregnant 11/19/2014  . False labor after 37 weeks of gestation without delivery 11/12/2014  . Anxiety 03/28/2014  . Chronic back pain 03/28/2014  . Anemia--7.1 early March 05/26/2013  . Hx pyelonephritis 2014 05/26/2013    Hospital course: The patient was admitted for SROM.   Her labor was not complicated. She proceeded to have a vaginal delivery of a healthy infant. Her delivery was not complicated. Her postpartum course was not complicated. She was discharged to home on postpartum day 2 doing well.  Feeding: breast and bottle  Contraception: unsure  Discharge hemoglobin: HEMOGLOBIN  Date Value Ref Range Status  11/22/2014 6.6* 12.0 - 15.0 g/dL Final    Comment:    REPEATED TO VERIFY CRITICAL RESULT CALLED TO, READ BACK BY AND VERIFIED WITH: K BROOKS @0625  11/22/14 BY S GEZAHEGN    HCT  Date Value Ref Range Status    11/22/2014 21.6* 36.0 - 46.0 % Final    PreNatal Labs ABO, Rh: A/Positive/-- (09/08 0000)   Antibody: Negative (09/08 0000) Rubella:    immune RPR: Non Reactive (04/02 2003)  HBsAg: Negative (09/08 0000)  HIV: NONREACTIVE (07/31 1519)  GBS: Positive (03/01 0000)  Discharge Physical Exam:  General: alert and cooperative, denies HA, dizziness, chest pain or SOB Lochia: appropriate Uterine Fundus: firm Incision: healing well, no significant drainage DVT Evaluation: No evidence of DVT seen on physical exam.  Intrapartum Procedures: spontaneous vaginal delivery Postpartum Procedures: none Complications-Operative and Postpartum: none  Discharge Diagnoses: Term Pregnancy-delivered,  Chronic anemia, severe anemia, asymptomatic anemia  Activity:           pelvic rest Diet:                routine Medications: PNV, Ibuprofen, Iron and Percocet Condition:      stable     Postpartum Teaching: Nutrition, exercise, return to work or school, family visits, sexual activity, home rest, vaginal bleeding, pelvic rest, family planning, s/s of PPD, breast care peri-care and incision care   Discharge to: home  Follow-up Information    Follow up with [facility] In 3 weeks.   Specialty:  Obstetrics and Gynecology   Why:  lab visit only, CBC for iron level  Contact information:   [address] [place] [address] [phone]      Follow up with [facility]. Schedule an appointment as soon as possible for a visit in 6 weeks.   Specialty:  Obstetrics and Gynecology   Why:  Postpartum check up   Contact information:   [address] [place] [address] [phone]       Shawnell Dykes, CNM, MSN 11/22/2014. 10:15 AM   Postpartum Care After Vaginal Delivery  After you deliver your newborn (postpartum period), the usual stay in the hospital is 24 72 hours. If  there were problems with your labor or delivery, or if you have other medical problems, you might be in the hospital longer.  While you are in the hospital, you will receive help and instructions on how to care for yourself and your newborn during the postpartum period.  While you are in the hospital:  Be sure to tell your nurses if you have pain or discomfort, as well as where you feel the pain and what makes the pain worse.  If you had an incision made near your vagina (episiotomy) or if you had some tearing during delivery, the nurses may put ice packs on your episiotomy or tear. The ice packs may help to reduce the pain and swelling.  If you are breastfeeding, you may feel uncomfortable contractions of your uterus for a couple of weeks. This is normal. The contractions help your uterus get back to normal size.  It is normal to have some bleeding after delivery.  For the first 1 3 days after delivery, the flow is red and the amount may be similar to a period.  It is common for the flow to start and stop.  In the first few days, you may pass some small clots. Let your nurses know if you begin to pass large clots or your flow increases.  Do not  flush blood clots down the toilet before having the nurse look at them.  During the next 3 10 days after delivery, your flow should become more watery and pink or brown-tinged in color.  Ten to fourteen days after delivery, your flow should be a small amount of yellowish-white discharge.  The amount of your flow will decrease over the first few weeks after delivery. Your flow may stop in 6 8 weeks. Most women have had their flow stop by 12 weeks after delivery.  You should change your sanitary pads frequently.  Wash your hands thoroughly with soap and water for at least 20 seconds after changing pads, using the toilet, or before holding or feeding your newborn.  You should feel like you need to empty your bladder within the first 6 8 hours  after delivery.  In case you become weak, lightheaded, or faint, call your nurse before you get out of bed for the first time and before you take a shower for the first time.  Within the first few days after delivery, your breasts may begin to feel tender and full. This is called engorgement. Breast tenderness usually goes away within 48 72 hours after engorgement occurs. You may also notice milk leaking from your breasts. If you are not breastfeeding, do not stimulate your breasts. Breast stimulation can make your breasts produce more milk.  Spending as much time as possible with your newborn is very important. During this time, you and your newborn can feel close and get to know each  other. Having your newborn stay in your room (rooming in) will help to strengthen the bond with your newborn. It will give you time to get to know your newborn and become comfortable caring for your newborn.  Your hormones change after delivery. Sometimes the hormone changes can temporarily cause you to feel sad or tearful. These feelings should not last more than a few days. If these feelings last longer than that, you should talk to your caregiver.  If desired, talk to your caregiver about methods of family planning or contraception.  Talk to your caregiver about immunizations. Your caregiver may want you to have the following immunizations before leaving the hospital:  Tetanus, diphtheria, and pertussis (Tdap) or tetanus and diphtheria (Td) immunization. It is very important that you and your family (including grandparents) or others caring for your newborn are up-to-date with the Tdap or Td immunizations. The Tdap or Td immunization can help protect your newborn from getting ill.  Rubella immunization.  Varicella (chickenpox) immunization.  Influenza immunization. You should receive this annual immunization if you did not receive the immunization during your pregnancy. Document Released: 06/03/2007 Document  Revised: 04/30/2012 Document Reviewed: 04/02/2012 [facility] Patient Information 2014 [place], [place].   Postpartum Depression and Baby Blues  The postpartum period begins right after the birth of a baby. During this time, there is often a great amount of joy and excitement. It is also a time of considerable changes in the life of the parent(s). Regardless of how many times a mother gives birth, each child brings new challenges and dynamics to the family. It is not unusual to have feelings of excitement accompanied by confusing shifts in moods, emotions, and thoughts. All mothers are at risk of developing postpartum depression or the "baby blues." These mood changes can occur right after giving birth, or they may occur many months after giving birth. The baby blues or postpartum depression can be mild or severe. Additionally, postpartum depression can resolve rather quickly, or it can be a long-term condition. CAUSES Elevated hormones and their rapid decline are thought to be a main cause of postpartum depression and the baby blues. There are a number of hormones that radically change during and after pregnancy. Estrogen and progesterone usually decrease immediately after delivering your baby. The level of thyroid hormone and various cortisol steroids also rapidly drop. Other factors that play a major role in these changes include major life events and genetics.  RISK FACTORS If you have any of the following risks for the baby blues or postpartum depression, know what symptoms to watch out for during the postpartum period. Risk factors that may increase the likelihood of getting the baby blues or postpartum depression include: 1. Havinga personal or family history of depression. 2. Having depression while being pregnant. 3. Having premenstrual or oral contraceptive-associated mood issues. 4. Having exceptional life stress. 5. Having marital conflict. 6. Lacking a social support network. 7. Having a  baby with special needs. 8. Having health problems such as diabetes. SYMPTOMS Baby blues symptoms include:  Brief fluctuations in mood, such as going from extreme happiness to sadness.  Decreased concentration.  Difficulty sleeping.  Crying spells, tearfulness.  Irritability.  Anxiety. Postpartum depression symptoms typically begin within the first month after giving birth. These symptoms include:  Difficulty sleeping or excessive sleepiness.  Marked weight loss.  Agitation.  Feelings of worthlessness.  Lack of interest in activity or food. Postpartum psychosis is a very concerning condition and can be dangerous. Fortunately, it is rare.  Displaying any of the following symptoms is cause for immediate medical attention. Postpartum psychosis symptoms include:  Hallucinations and delusions.  Bizarre or disorganized behavior.  Confusion or disorientation. DIAGNOSIS  A diagnosis is made by an evaluation of your symptoms. There are no medical or lab tests that lead to a diagnosis, but there are various questionnaires that a caregiver may use to identify those with the baby blues, postpartum depression, or psychosis. Often times, a screening tool called the [place] Postnatal Depression Scale is used to diagnose depression in the postpartum period.  TREATMENT The baby blues usually goes away on its own in 1 to 2 weeks. Social support is often all that is needed. You should be encouraged to get adequate sleep and rest. Occasionally, you may be given medicines to help you sleep.  Postpartum depression requires treatment as it can last several months or longer if it is not treated. Treatment may include individual or group therapy, medicine, or both to address any social, physiological, and psychological factors that may play a role in the depression. Regular exercise, a healthy diet, rest, and social support may also be strongly recommended.  Postpartum psychosis is more serious and  needs treatment right away. Hospitalization is often needed. HOME CARE INSTRUCTIONS  Get as much rest as you can. Nap when the baby sleeps.  Exercise regularly. Some women find yoga and walking to be beneficial.  Eat a balanced and nourishing diet.  Do little things that you enjoy. Have a cup of tea, take a bubble bath, read your favorite magazine, or listen to your favorite music.  Avoid alcohol.  Ask for help with household chores, cooking, grocery shopping, or running errands as needed. Do not try to do everything.  Talk to people close to you about how you are feeling. Get support from your partner, family members, friends, or other new moms.  Try to stay positive in how you think. Think about the things you are grateful for.  Do not spend a lot of time alone.  Only take medicine as directed by your caregiver.  Keep all your postpartum appointments.  Let your caregiver know if you have any concerns. SEEK MEDICAL CARE IF: You are having a reaction or problems with your medicine. SEEK IMMEDIATE MEDICAL CARE IF:  You have suicidal feelings.  You feel you may harm the baby or someone else. Document Released: 05/10/2004 Document Revised: 10/29/2011 Document Reviewed: 06/12/2011 [facility] Patient Information 2014 [place], [place].     Breastfeeding Deciding to breastfeed is one of the best choices you can make for you and your baby. A change in hormones during pregnancy causes your breast tissue to grow and increases the number and size of your milk ducts. These hormones also allow proteins, sugars, and fats from your blood supply to make breast milk in your milk-producing glands. Hormones prevent breast milk from being released before your baby is born as well as prompt milk flow after birth. Once breastfeeding has begun, thoughts of your baby, as well as his or her sucking or crying, can stimulate the release of milk from your milk-producing glands.  BENEFITS OF  BREASTFEEDING For Your Baby  Your first milk (colostrum) helps your baby's digestive system function better.   There are antibodies in your milk that help your baby fight off infections.   Your baby has a lower incidence of asthma, allergies, and sudden infant death syndrome.   The nutrients in breast milk are better for your baby than infant formulas and  are designed uniquely for your baby's needs.   Breast milk improves your baby's brain development.   Your baby is less likely to develop other conditions, such as childhood obesity, asthma, or type 2 diabetes mellitus.  For You   Breastfeeding helps to create a very special bond between you and your baby.   Breastfeeding is convenient. Breast milk is always available at the correct temperature and costs nothing.   Breastfeeding helps to burn calories and helps you lose the weight gained during pregnancy.   Breastfeeding makes your uterus contract to its prepregnancy size faster and slows bleeding (lochia) after you give birth.   Breastfeeding helps to lower your risk of developing type 2 diabetes mellitus, osteoporosis, and breast or ovarian cancer later in life. SIGNS THAT YOUR BABY IS HUNGRY Early Signs of Hunger  Increased alertness or activity.  Stretching.  Movement of the head from side to side.  Movement of the head and opening of the mouth when the corner of the mouth or cheek is stroked (rooting).  Increased sucking sounds, smacking lips, cooing, sighing, or squeaking.  Hand-to-mouth movements.  Increased sucking of fingers or hands. Late Signs of Hunger  Fussing.  Intermittent crying. Extreme Signs of Hunger Signs of extreme hunger will require calming and consoling before your baby will be able to breastfeed successfully. Do not wait for the following signs of extreme hunger to occur before you initiate breastfeeding:   Restlessness.  A loud, strong cry.   Screaming.   BREASTFEEDING  BASICS Breastfeeding Initiation  Find a comfortable place to sit or lie down, with your neck and back well supported.  Place a pillow or rolled up blanket under your baby to bring him or her to the level of your breast (if you are seated). Nursing pillows are specially designed to help support your arms and your baby while you breastfeed.  Make sure that your baby's abdomen is facing your abdomen.   Gently massage your breast. With your fingertips, massage from your chest wall toward your nipple in a circular motion. This encourages milk flow. You may need to continue this action during the feeding if your milk flows slowly.  Support your breast with 4 fingers underneath and your thumb above your nipple. Make sure your fingers are well away from your nipple and your baby's mouth.   Stroke your baby's lips gently with your finger or nipple.   When your baby's mouth is open wide enough, quickly bring your baby to your breast, placing your entire nipple and as much of the colored area around your nipple (areola) as possible into your baby's mouth.   More areola should be visible above your baby's upper lip than below the lower lip.   Your baby's tongue should be between his or her lower gum and your breast.   Ensure that your baby's mouth is correctly positioned around your nipple (latched). Your baby's lips should create a seal on your breast and be turned out (everted).  It is common for your baby to suck about 2-3 minutes in order to start the flow of breast milk. Latching Teaching your baby how to latch on to your breast properly is very important. An improper latch can cause nipple pain and decreased milk supply for you and poor weight gain in your baby. Also, if your baby is not latched onto your nipple properly, he or she may swallow some air during feeding. This can make your baby fussy. Burping your baby when you  switch breasts during the feeding can help to get rid of the air.  However, teaching your baby to latch on properly is still the best way to prevent fussiness from swallowing air while breastfeeding. Signs that your baby has successfully latched on to your nipple:    Silent tugging or silent sucking, without causing you pain.   Swallowing heard between every 3-4 sucks.    Muscle movement above and in front of his or her ears while sucking.  Signs that your baby has not successfully latched on to nipple:   Sucking sounds or smacking sounds from your baby while breastfeeding.  Nipple pain. If you think your baby has not latched on correctly, slip your finger into the corner of your baby's mouth to break the suction and place it between your baby's gums. Attempt breastfeeding initiation again. Signs of Successful Breastfeeding Signs from your baby:   A gradual decrease in the number of sucks or complete cessation of sucking.   Falling asleep.   Relaxation of his or her body.   Retention of a small amount of milk in his or her mouth.   Letting go of your breast by himself or herself. Signs from you:  Breasts that have increased in firmness, weight, and size 1-3 hours after feeding.   Breasts that are softer immediately after breastfeeding.  Increased milk volume, as well as a change in milk consistency and color by the fifth day of breastfeeding.   Nipples that are not sore, cracked, or bleeding. Signs That Your Pecola LeisureBaby is Getting Enough Milk  Wetting at least 3 diapers in a 24-hour period. The urine should be clear and pale yellow by age 388 days.  At least 3 stools in a 24-hour period by age 388 days. The stool should be soft and yellow.  At least 3 stools in a 24-hour period by age 38 days. The stool should be seedy and yellow.  No loss of weight greater than 10% of birth weight during the first 683 days of age.  Average weight gain of 4-7 ounces (113-198 g) per week after age 73 days.  Consistent daily weight gain by age 388 days,  without weight loss after the age of 2 weeks. After a feeding, your baby may spit up a small amount. This is common. BREASTFEEDING FREQUENCY AND DURATION Frequent feeding will help you make more milk and can prevent sore nipples and breast engorgement. Breastfeed when you feel the need to reduce the fullness of your breasts or when your baby shows signs of hunger. This is called "breastfeeding on demand." Avoid introducing a pacifier to your baby while you are working to establish breastfeeding (the first 4-6 weeks after your baby is born). After this time you may choose to use a pacifier. Research has shown that pacifier use during the first year of a baby's life decreases the risk of sudden infant death syndrome (SIDS). Allow your baby to feed on each breast as long as he or she wants. Breastfeed until your baby is finished feeding. When your baby unlatches or falls asleep while feeding from the first breast, offer the second breast. Because newborns are often sleepy in the first few weeks of life, you may need to awaken your baby to get him or her to feed. Breastfeeding times will vary from baby to baby. However, the following rules can serve as a guide to help you ensure that your baby is properly fed:  Newborns (babies 474 weeks of age or younger)  may breastfeed every 1-3 hours.  Newborns should not go longer than 3 hours during the day or 5 hours during the night without breastfeeding.  You should breastfeed your baby a minimum of 8 times in a 24-hour period until you begin to introduce solid foods to your baby at around 10 months of age. BREAST MILK PUMPING Pumping and storing breast milk allows you to ensure that your baby is exclusively fed your breast milk, even at times when you are unable to breastfeed. This is especially important if you are going back to work while you are still breastfeeding or when you are not able to be present during feedings. Your lactation consultant can give you  guidelines on how long it is safe to store breast milk.  A breast pump is a machine that allows you to pump milk from your breast into a sterile bottle. The pumped breast milk can then be stored in a refrigerator or freezer. Some breast pumps are operated by hand, while others use electricity. Ask your lactation consultant which type will work best for you. Breast pumps can be purchased, but some hospitals and breastfeeding support groups lease breast pumps on a monthly basis. A lactation consultant can teach you how to hand express breast milk, if you prefer not to use a pump.  CARING FOR YOUR BREASTS WHILE YOU BREASTFEED Nipples can become dry, cracked, and sore while breastfeeding. The following recommendations can help keep your breasts moisturized and healthy:  Avoid using soap on your nipples.   Wear a supportive bra. Although not required, special nursing bras and tank tops are designed to allow access to your breasts for breastfeeding without taking off your entire bra or top. Avoid wearing underwire-style bras or extremely tight bras.  Air dry your nipples for 3-78minutes after each feeding.   Use only cotton bra pads to absorb leaked breast milk. Leaking of breast milk between feedings is normal.   Use lanolin on your nipples after breastfeeding. Lanolin helps to maintain your skin's normal moisture barrier. If you use pure lanolin, you do not need to wash it off before feeding your baby again. Pure lanolin is not toxic to your baby. You may also hand express a few drops of breast milk and gently massage that milk into your nipples and allow the milk to air dry. In the first few weeks after giving birth, some women experience extremely full breasts (engorgement). Engorgement can make your breasts feel heavy, warm, and tender to the touch. Engorgement peaks within 3-5 days after you give birth. The following recommendations can help ease engorgement:  Completely empty your breasts while  breastfeeding or pumping. You may want to start by applying warm, moist heat (in the shower or with warm water-soaked hand towels) just before feeding or pumping. This increases circulation and helps the milk flow. If your baby does not completely empty your breasts while breastfeeding, pump any extra milk after he or she is finished.  Wear a snug bra (nursing or regular) or tank top for 1-2 days to signal your body to slightly decrease milk production.  Apply ice packs to your breasts, unless this is too uncomfortable for you.  Make sure that your baby is latched on and positioned properly while breastfeeding. If engorgement persists after 48 hours of following these recommendations, contact your health care provider or a Advertising copywriter. OVERALL HEALTH CARE RECOMMENDATIONS WHILE BREASTFEEDING  Eat healthy foods. Alternate between meals and snacks, eating 3 of each per day. Because  what you eat affects your breast milk, some of the foods may make your baby more irritable than usual. Avoid eating these foods if you are sure that they are negatively affecting your baby.  Drink milk, fruit juice, and water to satisfy your thirst (about 10 glasses a day).   Rest often, relax, and continue to take your prenatal vitamins to prevent fatigue, stress, and anemia.  Continue breast self-awareness checks.  Avoid chewing and smoking tobacco.  Avoid alcohol and drug use. Some medicines that may be harmful to your baby can pass through breast milk. It is important to ask your health care provider before taking any medicine, including all over-the-counter and prescription medicine as well as vitamin and herbal supplements. It is possible to become pregnant while breastfeeding. If birth control is desired, ask your health care provider about options that will be safe for your baby. SEEK MEDICAL CARE IF:   You feel like you want to stop breastfeeding or have become frustrated with breastfeeding.  You  have painful breasts or nipples.  Your nipples are cracked or bleeding.  Your breasts are red, tender, or warm.  You have a swollen area on either breast.  You have a fever or chills.  You have nausea or vomiting.  You have drainage other than breast milk from your nipples.  Your breasts do not become full before feedings by the fifth day after you give birth.  You feel sad and depressed.  Your baby is too sleepy to eat well.  Your baby is having trouble sleeping.   Your baby is wetting less than 3 diapers in a 24-hour period.  Your baby has less than 3 stools in a 24-hour period.  Your baby's skin or the white part of his or her eyes becomes yellow.   Your baby is not gaining weight by 38 days of age. SEEK IMMEDIATE MEDICAL CARE IF:   Your baby is overly tired (lethargic) and does not want to wake up and feed.  Your baby develops an unexplained fever. Document Released: 08/06/2005 Document Revised: 08/11/2013 Document Reviewed: 01/28/2013 [facility] Patient Information 2015 [place], [place]. This information is not intended to replace advice given to you by your health care provider. Make sure you discuss any questions you have with your health care provider.

## 2014-11-23 ENCOUNTER — Ambulatory Visit: Payer: Self-pay

## 2014-11-23 NOTE — Lactation Note (Signed)
This note was copied from the chart of Melanie Hancock. Lactation Consultation Note  Mom just arrived to room in tonight with baby.  Her breasts are full but not engorged.  She is not pumping consistently.  Instructed to pump breasts every 3 hours followed by hand expression.  I encouraged mom to call for latch assist if she desires.  Patient Name: Melanie Hancock MVHQI'OToday'Hancock Date: 11/23/2014     Maternal Data    Feeding Feeding Type: Formula Nipple Type: Regular Length of feed: 15 min  LATCH Score/Interventions                      Lactation Tools Discussed/Used     Consult Status      Melanie Hancock, Melanie Hancock 11/23/2014, 6:24 PM

## 2014-11-24 ENCOUNTER — Ambulatory Visit: Payer: Self-pay

## 2014-11-24 NOTE — Lactation Note (Addendum)
This note was copied from the chart of Boy Gregary Cromerthena Wedeking. Lactation Consultation Note  Patient Name: Boy Gregary Cromerthena Swearingin ZHYQM'VToday's Date: 11/24/2014 Reason for consult: Follow-up assessment  With this mom and term NICU baby, now 83 hours post partum. Mom had just finished breast feeding the baby, and has been doing well through the night with feeding on cue. Mom asked me to examine her right breast for a hard lump, at 12 o'clock, at the top of her breast, near her collar bone. It was round, firm, tender, and the size of a golf ball. i assisted mom with heat, massage and pumping, and was able to reduce this apparent clogged duct to about 1/5 of it's size, with mom saying it felt much better, less p[ainful. Mom has a great milk supply, pumping 5 ounces after 20 minutes of breast feeding. She did get less from the breast  Baby fed on, which was also the breast with the lump.  The baby is being discharged to home today. i told mom to breast feed on cue, and to pump to comfort for now, since the baby will be feeding. I explained to mom to allow the baby to soften one breast before moving to the other, to allow the baby to get hind milk, and to alternate breasts at each feeding. Mom knows to call lactation for questions/cocnerns.Support group also recommended to mom .  Mom has a symphony DEP at home, and I also gave her a manual hand pump, and instructed her in it's use, especially when trying to relieve a clogged duct.   Maternal Data    Feeding Feeding Type: Breast Fed Length of feed: 20 min  LATCH Score/Interventions Latch: Grasps breast easily, tongue down, lips flanged, rhythmical sucking.  Audible Swallowing: Spontaneous and intermittent  Type of Nipple: Everted at rest and after stimulation  Comfort (Breast/Nipple): Filling, red/small blisters or bruises, mild/mod discomfort     Hold (Positioning): No assistance needed to correctly position infant at breast.  LATCH Score: 9  Lactation Tools  Discussed/Used     Consult Status Consult Status: Complete Follow-up type: Call as needed    Alfred LevinsLee, Davyd Podgorski Anne 11/24/2014, 9:48 AM

## 2014-12-24 ENCOUNTER — Ambulatory Visit: Payer: Medicaid Other | Admitting: Physician Assistant

## 2014-12-24 ENCOUNTER — Other Ambulatory Visit: Payer: Medicaid Other

## 2014-12-24 ENCOUNTER — Ambulatory Visit: Payer: Medicaid Other | Admitting: Oncology

## 2014-12-24 ENCOUNTER — Ambulatory Visit: Payer: Medicaid Other

## 2014-12-31 ENCOUNTER — Ambulatory Visit: Payer: Medicaid Other

## 2015-03-19 ENCOUNTER — Emergency Department (INDEPENDENT_AMBULATORY_CARE_PROVIDER_SITE_OTHER)
Admission: EM | Admit: 2015-03-19 | Discharge: 2015-03-19 | Disposition: A | Discharge status: Home / Self Care | Payer: Medicaid Other | Source: Home / Self Care | Attending: Family Medicine | Admitting: Family Medicine

## 2015-03-19 ENCOUNTER — Encounter (HOSPITAL_COMMUNITY): Payer: Self-pay | Admitting: Emergency Medicine

## 2015-03-19 DIAGNOSIS — N390 Urinary tract infection, site not specified: Secondary | ICD-10-CM

## 2015-03-19 DIAGNOSIS — B9789 Other viral agents as the cause of diseases classified elsewhere: Secondary | ICD-10-CM

## 2015-03-19 DIAGNOSIS — J069 Acute upper respiratory infection, unspecified: Secondary | ICD-10-CM

## 2015-03-19 LAB — POCT PREGNANCY, URINE: Preg Test, Ur: NEGATIVE

## 2015-03-19 LAB — POCT URINALYSIS DIP (DEVICE)
Bilirubin Urine: NEGATIVE
Glucose, UA: NEGATIVE mg/dL
Hgb urine dipstick: NEGATIVE
Ketones, ur: NEGATIVE mg/dL
Nitrite: NEGATIVE
Protein, ur: 30 mg/dL — AB
Specific Gravity, Urine: 1.02 (ref 1.005–1.030)
Urobilinogen, UA: 1 mg/dL (ref 0.0–1.0)
pH: 7.5 (ref 5.0–8.0)

## 2015-03-19 MED ORDER — NITROFURANTOIN MONOHYD MACRO 100 MG PO CAPS: 100.0000 mg | ORAL_CAPSULE | Freq: Two times a day (BID) | ORAL | Status: DC

## 2015-03-19 NOTE — ED Notes (Signed)
C/o cold sx onset 3 days associated w/prod cough and congestion Also reports poss UTI onset 1 week associated w/cloudy urine w/foul odor ot it Denies fevers chills Alert, no signs of acute distress.

## 2015-03-19 NOTE — Discharge Instructions (Signed)
Urinary Tract Infection A urinary tract infection (UTI) can occur any place along the urinary tract. The tract includes the kidneys, ureters, bladder, and urethra. A type of germ called bacteria often causes a UTI. UTIs are often helped with antibiotic medicine.  HOME CARE   If given, take antibiotics as told by your doctor. Finish them even if you start to feel better.  Drink enough fluids to keep your pee (urine) clear or pale yellow.  Avoid tea, drinks with caffeine, and bubbly (carbonated) drinks.  Pee often. Avoid holding your pee in for a long time.  Pee before and after having sex (intercourse).  Wipe from front to back after you poop (bowel movement) if you are a woman. Use each tissue only once. GET HELP RIGHT AWAY IF:   You have back pain.  You have lower belly (abdominal) pain.  You have chills.  You feel sick to your stomach (nauseous).  You throw up (vomit).  Your burning or discomfort with peeing does not go away.  You have a fever.  Your symptoms are not better in 3 days. MAKE SURE YOU:   Understand these instructions.  Will watch your condition.  Will get help right away if you are not doing well or get worse. Document Released: 01/23/2008 Document Revised: 04/30/2012 Document Reviewed: 03/06/2012 [facility] Patient Information 2015 [place], [place]. This information is not intended to replace advice given to you by your health care provider. Make sure you discuss any questions you have with your health care provider.    Try Delsym OTC for cough if needed. May also try Claritin. Drink plenty of water.

## 2015-03-19 NOTE — ED Notes (Signed)
Call back number verified.  

## 2015-03-19 NOTE — ED Provider Notes (Signed)
CSN: [id number]     Arrival date & time 03/19/15  1542 History   First MD Initiated Contact with Patient 03/19/15 1734     Chief Complaint  Patient presents with  . URI  . Urinary Tract Infection   (Consider location/radiation/quality/duration/timing/severity/associated sxs/prior Treatment) HPI Comments: Patient presents with a productive cough x 3 days, with "regular phlegm". No fever or chills. She has reports dysuria without hematuria or vaginal discharge x 1 week. Denies abdominal pain or pelvic pain. History of UTI's in the past.   Patient is a 19 y.o. female presenting with URI and urinary tract infection. The history is provided by the patient.  URI Urinary Tract Infection   Past Medical History  Diagnosis Date  . Infection     UTI  . UTI (lower urinary tract infection)   . Anxiety   . Anemia   . Pyelonephritis   . Chronic back pain    Past Surgical History  Procedure Laterality Date  . Induced abortion    . Dilation and curettage of uterus     Family History  Problem Relation Age of Onset  . Alcohol abuse Neg Hx    History  Substance Use Topics  . Smoking status: Never Smoker   . Smokeless tobacco: Never Used  . Alcohol Use: No   OB History    Gravida Para Term Preterm AB TAB SAB Ectopic Multiple Living   4 2 2  2 1 1   0 2     Review of Systems  All other systems reviewed and are negative.   Allergies  Review of patient's allergies indicates no known allergies.  Home Medications   Prior to Admission medications   Medication Sig Start Date End Date Taking? Authorizing Provider  ferrous gluconate (FERGON) 324 MG tablet Take 1 tablet (324 mg total) by mouth 3 (three) times daily with meals. 11/22/14   Venus Standard, CNM  ibuprofen (ADVIL,MOTRIN) 800 MG tablet Take 1,600 mg by mouth every 8 (eight) hours as needed for moderate pain.    Historical Provider, MD  nitrofurantoin, macrocrystal-monohydrate, (MACROBID) 100 MG capsule Take 1 capsule (100 mg  total) by mouth 2 (two) times daily. 03/19/15   Riki Sheer, PA-C  oxyCODONE-acetaminophen (PERCOCET/ROXICET) 5-325 MG per tablet Take 1 tablet by mouth every 4 (four) hours as needed (for pain scale 4-7). 11/22/14   Venus Standard, CNM  Prenatal Multivit-Min-Fe-FA (PRENATAL VITAMINS PO) Take 1 tablet by mouth daily.    Historical Provider, MD   Breastfeeding? No Physical Exam  Constitutional: She is oriented to person, place, and time. She appears well-developed and well-nourished. No distress.  HENT:  Head: Normocephalic and atraumatic.  Right Ear: External ear normal.  Left Ear: External ear normal.  Mouth/Throat: Oropharynx is clear and moist.  Neck: Normal range of motion.  Cardiovascular: Normal rate and regular rhythm.   Pulmonary/Chest: Effort normal. No respiratory distress. She has no wheezes.  Abdominal: Soft. There is no tenderness. There is no rebound and no guarding.  Lymphadenopathy:    She has no cervical adenopathy.  Neurological: She is alert and oriented to person, place, and time.  Skin: Skin is warm and dry. She is not diaphoretic.  Psychiatric: Her behavior is normal.  Nursing note and vitals reviewed.   ED Course  Procedures (including critical care time) Labs Review Labs Reviewed  POCT URINALYSIS DIP (DEVICE) - Abnormal; Notable for the following:    Protein, ur 30 (*)    Leukocytes, UA SMALL (*)  All other components within normal limits  POCT PREGNANCY, URINE    Imaging Review No results found.   MDM   1. UTI (lower urinary tract infection)   2. Viral URI with cough    1. Treat with abx and supportive care. Push fluids.  2. No signs of bacterial resp infection. Continue OTC remedies. F/U if worsens.     Riki Sheer, PA-C 03/19/15 [phone]

## 2015-05-01 ENCOUNTER — Emergency Department (HOSPITAL_COMMUNITY)
Admission: EM | Admit: 2015-05-01 | Discharge: 2015-05-01 | Disposition: A | Discharge status: Home / Self Care | Payer: No Typology Code available for payment source | Attending: Emergency Medicine | Admitting: Emergency Medicine

## 2015-05-01 ENCOUNTER — Emergency Department (HOSPITAL_COMMUNITY): Payer: No Typology Code available for payment source

## 2015-05-01 ENCOUNTER — Encounter (HOSPITAL_COMMUNITY): Payer: Self-pay | Admitting: Nurse Practitioner

## 2015-05-01 DIAGNOSIS — Z87448 Personal history of other diseases of urinary system: Secondary | ICD-10-CM | POA: Insufficient documentation

## 2015-05-01 DIAGNOSIS — G8929 Other chronic pain: Secondary | ICD-10-CM | POA: Insufficient documentation

## 2015-05-01 DIAGNOSIS — Y92410 Unspecified street and highway as the place of occurrence of the external cause: Secondary | ICD-10-CM | POA: Insufficient documentation

## 2015-05-01 DIAGNOSIS — Z87440 Personal history of urinary (tract) infections: Secondary | ICD-10-CM | POA: Insufficient documentation

## 2015-05-01 DIAGNOSIS — Z862 Personal history of diseases of the blood and blood-forming organs and certain disorders involving the immune mechanism: Secondary | ICD-10-CM | POA: Diagnosis not present

## 2015-05-01 DIAGNOSIS — Y998 Other external cause status: Secondary | ICD-10-CM | POA: Diagnosis not present

## 2015-05-01 DIAGNOSIS — Z3202 Encounter for pregnancy test, result negative: Secondary | ICD-10-CM | POA: Insufficient documentation

## 2015-05-01 DIAGNOSIS — S161XXA Strain of muscle, fascia and tendon at neck level, initial encounter: Secondary | ICD-10-CM | POA: Insufficient documentation

## 2015-05-01 DIAGNOSIS — Z8659 Personal history of other mental and behavioral disorders: Secondary | ICD-10-CM | POA: Insufficient documentation

## 2015-05-01 DIAGNOSIS — Y9389 Activity, other specified: Secondary | ICD-10-CM | POA: Insufficient documentation

## 2015-05-01 DIAGNOSIS — S199XXA Unspecified injury of neck, initial encounter: Secondary | ICD-10-CM | POA: Diagnosis present

## 2015-05-01 LAB — I-STAT BETA HCG BLOOD, ED (MC, WL, AP ONLY): I-stat hCG, quantitative: <5 m[IU]/mL (ref <5)

## 2015-05-01 MED ORDER — IBUPROFEN 800 MG PO TABS
800.0000 mg | ORAL_TABLET | Freq: Once | ORAL | Status: AC
  Administered 2015-05-01: 800 mg via ORAL
  Filled 2015-05-01: qty 1

## 2015-05-01 MED ORDER — DIAZEPAM 5 MG PO TABS: 5.0000 mg | ORAL_TABLET | Freq: Three times a day (TID) | ORAL | Status: DC | PRN
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
## 2015-05-01 NOTE — Discharge Instructions (Signed)
Cervical Sprain A cervical sprain is when the tissues (ligaments) that hold the neck bones in place stretch or tear. HOME CARE   Put ice on the injured area.  Put ice in a plastic bag.  Place a towel between your skin and the bag.  Leave the ice on for 15-20 minutes, 3-4 times a day.  You may have been given a collar to wear. This collar keeps your neck from moving while you heal.  Do not take the collar off unless told by your doctor.  If you have long hair, keep it outside of the collar.  Ask your doctor before changing the position of your collar. You may need to change its position over time to make it more comfortable.  If you are allowed to take off the collar for cleaning or bathing, follow your doctor's instructions on how to do it safely.  Keep your collar clean by wiping it with mild soap and water. Dry it completely. If the collar has removable pads, remove them every 1-2 days to hand wash them with soap and water. Allow them to air dry. They should be dry before you wear them in the collar.  Do not drive while wearing the collar.  Only take medicine as told by your doctor.  Keep all doctor visits as told.  Keep all physical therapy visits as told.  Adjust your work station so that you have good posture while you work.  Avoid positions and activities that make your problems worse.  Warm up and stretch before being active. GET HELP IF:  Your pain is not controlled with medicine.  You cannot take less pain medicine over time as planned.  Your activity level does not improve as expected. GET HELP RIGHT AWAY IF:   You are bleeding.  Your stomach is upset.  You have an allergic reaction to your medicine.  You develop new problems that you cannot explain.  You lose feeling (become numb) or you cannot move any part of your body (paralysis).  You have tingling or weakness in any part of your body.  Your symptoms get worse. Symptoms include:  Pain,  soreness, stiffness, puffiness (swelling), or a burning feeling in your neck.  Pain when your neck is touched.  Shoulder or upper back pain.  Limited ability to move your neck.  Headache.  Dizziness.  Your hands or arms feel week, lose feeling, or tingle.  Muscle spasms.  Difficulty swallowing or chewing. MAKE SURE YOU:   Understand these instructions.  Will watch your condition.  Will get help right away if you are not doing well or get worse. Document Released: 01/23/2008 Document Revised: 04/08/2013 Document Reviewed: 02/11/2013 [employer] Patient Information 2015 [employer]. This information is not intended to replace advice given to you by your health care provider. Make sure you discuss any questions you have with your health care provider.  
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
## 2015-06-14 ENCOUNTER — Emergency Department (HOSPITAL_COMMUNITY)
Admission: EM | Admit: 2015-06-14 | Discharge: 2015-06-14 | Disposition: A | Discharge status: Home / Self Care | Payer: Medicaid Other | Attending: Emergency Medicine | Admitting: Emergency Medicine

## 2015-06-14 ENCOUNTER — Encounter (HOSPITAL_COMMUNITY): Payer: Self-pay | Admitting: *Deleted

## 2015-06-14 ENCOUNTER — Emergency Department (HOSPITAL_COMMUNITY): Payer: Medicaid Other

## 2015-06-14 ENCOUNTER — Emergency Department (HOSPITAL_COMMUNITY)
Admission: EM | Admit: 2015-06-14 | Discharge: 2015-06-14 | Disposition: A | Discharge status: Home / Self Care | Payer: Medicaid Other | Source: Home / Self Care | Attending: Physician Assistant | Admitting: Physician Assistant

## 2015-06-14 ENCOUNTER — Encounter (HOSPITAL_COMMUNITY): Payer: Self-pay | Admitting: Emergency Medicine

## 2015-06-14 DIAGNOSIS — O210 Mild hyperemesis gravidarum: Secondary | ICD-10-CM | POA: Diagnosis not present

## 2015-06-14 DIAGNOSIS — Z862 Personal history of diseases of the blood and blood-forming organs and certain disorders involving the immune mechanism: Secondary | ICD-10-CM | POA: Insufficient documentation

## 2015-06-14 DIAGNOSIS — Z3490 Encounter for supervision of normal pregnancy, unspecified, unspecified trimester: Secondary | ICD-10-CM

## 2015-06-14 DIAGNOSIS — O99340 Other mental disorders complicating pregnancy, unspecified trimester: Secondary | ICD-10-CM | POA: Insufficient documentation

## 2015-06-14 DIAGNOSIS — Z331 Pregnant state, incidental: Secondary | ICD-10-CM

## 2015-06-14 DIAGNOSIS — Z87891 Personal history of nicotine dependence: Secondary | ICD-10-CM | POA: Insufficient documentation

## 2015-06-14 DIAGNOSIS — Z3A00 Weeks of gestation of pregnancy not specified: Secondary | ICD-10-CM | POA: Diagnosis not present

## 2015-06-14 DIAGNOSIS — Z8619 Personal history of other infectious and parasitic diseases: Secondary | ICD-10-CM | POA: Diagnosis not present

## 2015-06-14 DIAGNOSIS — Z87448 Personal history of other diseases of urinary system: Secondary | ICD-10-CM | POA: Diagnosis not present

## 2015-06-14 DIAGNOSIS — A5909 Other urogenital trichomoniasis: Secondary | ICD-10-CM

## 2015-06-14 DIAGNOSIS — F419 Anxiety disorder, unspecified: Secondary | ICD-10-CM | POA: Insufficient documentation

## 2015-06-14 DIAGNOSIS — O99350 Diseases of the nervous system complicating pregnancy, unspecified trimester: Secondary | ICD-10-CM | POA: Diagnosis not present

## 2015-06-14 DIAGNOSIS — O9989 Other specified diseases and conditions complicating pregnancy, childbirth and the puerperium: Secondary | ICD-10-CM | POA: Diagnosis present

## 2015-06-14 DIAGNOSIS — D649 Anemia, unspecified: Secondary | ICD-10-CM

## 2015-06-14 DIAGNOSIS — Z87440 Personal history of urinary (tract) infections: Secondary | ICD-10-CM | POA: Diagnosis not present

## 2015-06-14 DIAGNOSIS — G8929 Other chronic pain: Secondary | ICD-10-CM | POA: Diagnosis not present

## 2015-06-14 DIAGNOSIS — R102 Pelvic and perineal pain: Secondary | ICD-10-CM

## 2015-06-14 DIAGNOSIS — O219 Vomiting of pregnancy, unspecified: Secondary | ICD-10-CM

## 2015-06-14 LAB — COMPREHENSIVE METABOLIC PANEL
ALT: 16 U/L (ref 14–54)
ALT: 17 U/L (ref 14–54)
AST: 19 U/L (ref 15–41)
AST: 21 U/L (ref 15–41)
Albumin: 3.9 g/dL (ref 3.5–5.0)
Albumin: 4.1 g/dL (ref 3.5–5.0)
Alkaline Phosphatase: 58 U/L (ref 38–126)
Alkaline Phosphatase: 60 U/L (ref 38–126)
Anion gap: 10 (ref 5–15)
Anion gap: 12 (ref 5–15)
BUN: 6 mg/dL (ref 6–20)
BUN: 7 mg/dL (ref 6–20)
CO2: 21 mmol/L — ABNORMAL LOW (ref 22–32)
CO2: 18 mmol/L — ABNORMAL LOW (ref 22–32)
Calcium: 9.5 mg/dL (ref 8.9–10.3)
Calcium: 9.7 mg/dL (ref 8.9–10.3)
Chloride: 104 mmol/L (ref 101–111)
Chloride: 105 mmol/L (ref 101–111)
Creatinine, Ser: 0.73 mg/dL (ref 0.44–1.00)
Creatinine, Ser: 0.67 mg/dL (ref 0.44–1.00)
GFR calc Af Amer: >60 mL/min (ref >60)
GFR calc Af Amer: >60 mL/min (ref >60)
GFR calc non Af Amer: >60 mL/min (ref >60)
GFR calc non Af Amer: >60 mL/min (ref >60)
Glucose, Bld: 82 mg/dL (ref 65–99)
Glucose, Bld: 107 mg/dL — ABNORMAL HIGH (ref 65–99)
Potassium: 3.5 mmol/L (ref 3.5–5.1)
Potassium: 3.4 mmol/L — ABNORMAL LOW (ref 3.5–5.1)
Sodium: 135 mmol/L (ref 135–145)
Sodium: 135 mmol/L (ref 135–145)
Total Bilirubin: 0.7 mg/dL (ref 0.3–1.2)
Total Bilirubin: 0.8 mg/dL (ref 0.3–1.2)
Total Protein: 7.4 g/dL (ref 6.5–8.1)
Total Protein: 7.7 g/dL (ref 6.5–8.1)

## 2015-06-14 LAB — WET PREP, GENITAL: Yeast Wet Prep HPF POC: NONE SEEN

## 2015-06-14 LAB — CBC WITH DIFFERENTIAL/PLATELET
Basophils Absolute: 0.1 10*3/uL (ref 0.0–0.1)
Basophils Absolute: 0 10*3/uL (ref 0.0–0.1)
Basophils Relative: 1 %
Basophils Relative: 0 %
Eosinophils Absolute: 0.4 10*3/uL (ref 0.0–0.7)
Eosinophils Absolute: 0 10*3/uL (ref 0.0–0.7)
Eosinophils Relative: 6 %
Eosinophils Relative: 0 %
HCT: 28.9 % — ABNORMAL LOW (ref 36.0–46.0)
HCT: 28.8 % — ABNORMAL LOW (ref 36.0–46.0)
Hemoglobin: 8.8 g/dL — ABNORMAL LOW (ref 12.0–15.0)
Hemoglobin: 8.7 g/dL — ABNORMAL LOW (ref 12.0–15.0)
Lymphocytes Relative: 25 %
Lymphocytes Relative: 9 %
Lymphs Abs: 1.8 10*3/uL (ref 0.7–4.0)
Lymphs Abs: 0.6 10*3/uL — ABNORMAL LOW (ref 0.7–4.0)
MCH: 20.1 pg — ABNORMAL LOW (ref 26.0–34.0)
MCH: 19.7 pg — ABNORMAL LOW (ref 26.0–34.0)
MCHC: 30.4 g/dL (ref 30.0–36.0)
MCHC: 30.2 g/dL (ref 30.0–36.0)
MCV: 66.1 fL — ABNORMAL LOW (ref 78.0–100.0)
MCV: 65.3 fL — ABNORMAL LOW (ref 78.0–100.0)
Monocytes Absolute: 0.5 10*3/uL (ref 0.1–1.0)
Monocytes Absolute: 0.3 10*3/uL (ref 0.1–1.0)
Monocytes Relative: 7 %
Monocytes Relative: 4 %
Neutro Abs: 4.5 10*3/uL (ref 1.7–7.7)
Neutro Abs: 5.9 10*3/uL (ref 1.7–7.7)
Neutrophils Relative %: 61 %
Neutrophils Relative %: 87 %
Platelets: 367 10*3/uL (ref 150–400)
Platelets: 396 10*3/uL (ref 150–400)
RBC: 4.37 MIL/uL (ref 3.87–5.11)
RBC: 4.41 MIL/uL (ref 3.87–5.11)
RDW: 19.1 % — ABNORMAL HIGH (ref 11.5–15.5)
RDW: 18.9 % — ABNORMAL HIGH (ref 11.5–15.5)
WBC: 7.3 10*3/uL (ref 4.0–10.5)
WBC: 6.8 10*3/uL (ref 4.0–10.5)

## 2015-06-14 LAB — URINALYSIS, ROUTINE W REFLEX MICROSCOPIC
Bilirubin Urine: NEGATIVE
Glucose, UA: NEGATIVE mg/dL
Hgb urine dipstick: NEGATIVE
Ketones, ur: >80 mg/dL — AB
Leukocytes, UA: NEGATIVE
Nitrite: NEGATIVE
Protein, ur: NEGATIVE mg/dL
Specific Gravity, Urine: 1.019 (ref 1.005–1.030)
Urobilinogen, UA: 0.2 mg/dL (ref 0.0–1.0)
pH: 8.5 — ABNORMAL HIGH (ref 5.0–8.0)

## 2015-06-14 LAB — I-STAT TROPONIN, ED: Troponin i, poc: 0 ng/mL (ref 0.00–0.08)

## 2015-06-14 LAB — HCG, QUANTITATIVE, PREGNANCY: hCG, Beta Chain, Quant, S: 48090 m[IU]/mL — ABNORMAL HIGH (ref <5)

## 2015-06-14 LAB — I-STAT BETA HCG BLOOD, ED (MC, WL, AP ONLY): I-stat hCG, quantitative: >2000 m[IU]/mL — ABNORMAL HIGH (ref <5)

## 2015-06-14 LAB — LIPASE, BLOOD
Lipase: 20 U/L (ref 11–51)
Lipase: 22 U/L (ref 11–51)

## 2015-06-14 MED ORDER — PROMETHAZINE HCL 25 MG/ML IJ SOLN
12.5000 mg | Freq: Once | INTRAMUSCULAR | Status: AC
  Administered 2015-06-14: 12.5 mg via INTRAVENOUS
  Filled 2015-06-14: qty 1

## 2015-06-14 MED ORDER — SODIUM CHLORIDE 0.9 % IV BOLUS (SEPSIS): 1000.0000 mL | Freq: Once | INTRAVENOUS | Status: DC

## 2015-06-14 MED ORDER — ONDANSETRON HCL 4 MG PO TABS: 4.0000 mg | ORAL_TABLET | Freq: Three times a day (TID) | ORAL | Status: DC | PRN

## 2015-06-14 MED ORDER — ONDANSETRON HCL 4 MG/2ML IJ SOLN
4.0000 mg | Freq: Once | INTRAMUSCULAR | Status: AC
  Administered 2015-06-14: 4 mg via INTRAVENOUS
  Filled 2015-06-14: qty 2

## 2015-06-14 MED ORDER — PRENATAL VITAMINS 0.8 MG PO TABS: 1.0000 | ORAL_TABLET | Freq: Every day | ORAL | Status: DC

## 2015-06-14 MED ORDER — SODIUM CHLORIDE 0.9 % IV BOLUS (SEPSIS)
2000.0000 mL | Freq: Once | INTRAVENOUS | Status: AC
Start: 2015-06-14 — End: 2015-06-14
  Administered 2015-06-14: 2000 mL via INTRAVENOUS

## 2015-06-14 MED ORDER — ONDANSETRON HCL 4 MG/2ML IJ SOLN
4.0000 mg | Freq: Once | INTRAMUSCULAR | Status: AC
  Administered 2015-06-14: 4 mg via INTRAVENOUS

## 2015-06-14 MED ORDER — METOCLOPRAMIDE HCL 5 MG/ML IJ SOLN
10.0000 mg | Freq: Once | INTRAMUSCULAR | Status: AC
  Administered 2015-06-14: 10 mg via INTRAVENOUS
  Filled 2015-06-14: qty 2

## 2015-06-14 MED ORDER — SODIUM CHLORIDE 0.9 % IV BOLUS (SEPSIS)
1000.0000 mL | Freq: Once | INTRAVENOUS | Status: AC
  Administered 2015-06-14: 1000 mL via INTRAVENOUS

## 2015-06-14 MED ORDER — METRONIDAZOLE 500 MG PO TABS
2000.0000 mg | ORAL_TABLET | Freq: Once | ORAL | Status: AC
  Administered 2015-06-14: 2000 mg via ORAL
  Filled 2015-06-14: qty 4

## 2015-06-14 NOTE — ED Notes (Signed)
Patient vomiting on the floor.

## 2015-06-14 NOTE — ED Notes (Signed)
Pt continues to vomit. EDP notified

## 2015-06-14 NOTE — ED Notes (Signed)
Pt crying on pain walking to the bathroom.

## 2015-06-14 NOTE — ED Notes (Signed)
Pt was on all 4's on the bed attempting to vomiting when the family member came out of the room and asked for assistance. Pt was asked to please sit back in bed so she wouldn't fall out of it. Pt rolled over on to her side and laid on her side. Pt  Was handed a vomit bag and family was giving 2 more just in case she needs them.

## 2015-06-14 NOTE — ED Notes (Signed)
Patient presents via EMS with c/o N/V for 1 week.  States tonight after she threw up she started with CP and burning in her chest and throat

## 2015-06-14 NOTE — ED Notes (Signed)
Pt still nauseous at this time.

## 2015-06-14 NOTE — Discharge Instructions (Signed)

## 2015-06-14 NOTE — ED Notes (Signed)
Pt here this am and was discharged with vomiting during pregnancy now back c/o severe abd pain x several days; pt crying and will not answer questions

## 2015-06-14 NOTE — ED Provider Notes (Signed)
CSN: [id number]     Arrival date & time 06/14/15  1602 History   First MD Initiated Contact with Patient 06/14/15 1653     Chief Complaint  Patient presents with  . Abdominal Pain     (Consider location/radiation/quality/duration/timing/severity/associated sxs/prior Treatment) HPI Comments: Pt comes in with generalized abdominal pain, back pain  and vomiting that started several days ago. She states that she was seen in the er this morning and told that she was pregnant. She has not had us or ob/gyn follow up. Denies dysuria or discharge. She states that she doesn't know when her last period was. She states that she hasn't taken the medication given to her earlier. She states that she has had back pain with her pregnancies in the past  The history is provided by the patient. No language interpreter was used.    Past Medical History  Diagnosis Date  . Infection     UTI  . UTI (lower urinary tract infection)   . Anxiety   . Anemia   . Pyelonephritis   . Chronic back pain    Past Surgical History  Procedure Laterality Date  . Induced abortion    . Dilation and curettage of uterus     Family History  Problem Relation Age of Onset  . Alcohol abuse Neg Hx    Social History  Substance Use Topics  . Smoking status: Never Smoker   . Smokeless tobacco: Never Used  . Alcohol Use: No   OB History    Gravida Para Term Preterm AB TAB SAB Ectopic Multiple Living   4 2 2  2 1 1   0 2     Review of Systems  All other systems reviewed and are negative.     Allergies  Review of patient's allergies indicates no known allergies.  Home Medications   Prior to Admission medications   Medication Sig Start Date End Date Taking? Authorizing Provider  diazepam (VALIUM) 5 MG tablet Take 1 tablet (5 mg total) by mouth every 8 (eight) hours as needed for muscle spasms. 05/01/15   Laurence Spatesachel Morgan Little, MD  ondansetron (ZOFRAN) 4 MG tablet Take 1 tablet (4 mg total) by mouth every 8 (eight)  hours as needed for nausea or vomiting. 06/14/15   Loren Raceravid Yelverton, MD  Prenatal Multivit-Min-Fe-FA (PRENATAL VITAMINS) 0.8 MG tablet Take 1 tablet by mouth daily. 06/14/15   Loren Raceravid Yelverton, MD   BP 128/72 mmHg  Pulse 81  Temp(Src) 97.3 F (36.3 C)  Resp 20  Ht 5\' 6"  (1.676 m)  SpO2 100%  LMP 04/23/2015 Physical Exam  Constitutional: She is oriented to person, place, and time. She appears well-developed and well-nourished.  Cardiovascular: Normal rate and regular rhythm.   Pulmonary/Chest: Effort normal and breath sounds normal.  Abdominal: Bowel sounds are normal.  Diffuse tenderness  Genitourinary:  White discharge.-cmt  Musculoskeletal: Normal range of motion.  Neurological: She is alert and oriented to person, place, and time.  Skin: Skin is warm.  Psychiatric: She has a normal mood and affect.  Nursing note and vitals reviewed.   ED Course  Procedures (including critical care time) Labs Review Labs Reviewed  WET PREP, GENITAL - Abnormal; Notable for the following:    Trich, Wet Prep FEW (*)    Clue Cells Wet Prep HPF POC FEW (*)    WBC, Wet Prep HPF POC FEW (*)    All other components within normal limits  URINALYSIS, ROUTINE W REFLEX MICROSCOPIC (NOT AT [facility]) -  Abnormal; Notable for the following:    pH 8.5 (*)    Ketones, ur >80 (*)    All other components within normal limits  COMPREHENSIVE METABOLIC PANEL - Abnormal; Notable for the following:    Potassium 3.4 (*)    CO2 18 (*)    Glucose, Bld 107 (*)    All other components within normal limits  CBC WITH DIFFERENTIAL/PLATELET - Abnormal; Notable for the following:    Hemoglobin 8.7 (*)    HCT 28.8 (*)    MCV 65.3 (*)    MCH 19.7 (*)    RDW 18.9 (*)    Lymphs Abs 0.6 (*)    All other components within normal limits  HCG, QUANTITATIVE, PREGNANCY - Abnormal; Notable for the following:    hCG, Beta Chain, Quant, S 48090 (*)    All other components within normal limits  LIPASE, BLOOD  GC/CHLAMYDIA PROBE  AMP ([place]) NOT AT [facility]    Imaging Review US Ob Comp Less 14 Wks  06/14/2015  CLINICAL DATA:  19 year old female pregnant patient complaining of nausea and vomiting for 1 week. EXAM: OBSTETRIC <14 WK [place] AND TRANSVAGINAL OB US TECHNIQUE: Both transabdominal and transvaginal ultrasound examinations were performed for complete evaluation of the gestation as well as the maternal uterus, adnexal regions, and pelvic cul-de-sac. Transvaginal technique was performed to assess early pregnancy. COMPARISON:  Ob ultrasound 06/07/2014. FINDINGS: Intrauterine gestational sac: Single round gestational sac in the fundal portion of the endometrial canal. Yolk sac:  Present Embryo:  Present Cardiac Activity: Present Heart Rate: 122  bpm CRL:  8.0  mm   6 w   5 d                  [place] EDC: 02/02/2016 Maternal uterus/adnexae: No subchorionic hemorrhage or other acute findings. Ovaries are normal in appearance bilaterally. No significant free fluid in the cul-de-sac. IMPRESSION: 1. Single viable IUP with estimated gestational age of [redacted] weeks and 5 days and fetal heart rate of 122 beats per minute. 2. No acute findings. Electronically Signed   By: Trudie Reed M.D.   On: 06/14/2015 18:42   US Ob Transvaginal  06/14/2015  CLINICAL DATA:  19 year old female pregnant patient complaining of nausea and vomiting for 1 week. EXAM: OBSTETRIC <14 WK [place] AND TRANSVAGINAL OB US TECHNIQUE: Both transabdominal and transvaginal ultrasound examinations were performed for complete evaluation of the gestation as well as the maternal uterus, adnexal regions, and pelvic cul-de-sac. Transvaginal technique was performed to assess early pregnancy. COMPARISON:  Ob ultrasound 06/07/2014. FINDINGS: Intrauterine gestational sac: Single round gestational sac in the fundal portion of the endometrial canal. Yolk sac:  Present Embryo:  Present Cardiac Activity: Present Heart Rate: 122  bpm CRL:  8.0  mm   6 w   5 d                  [place] EDC: 02/02/2016  Maternal uterus/adnexae: No subchorionic hemorrhage or other acute findings. Ovaries are normal in appearance bilaterally. No significant free fluid in the cul-de-sac. IMPRESSION: 1. Single viable IUP with estimated gestational age of [redacted] weeks and 5 days and fetal heart rate of 122 beats per minute. 2. No acute findings. Electronically Signed   By: Trudie Reed M.D.   On: 06/14/2015 18:42   I have personally reviewed and evaluated these images and lab results as part of my medical decision-making.   EKG Interpretation None      MDM  Final diagnoses:  Nausea/vomiting in pregnancy  Anemia, unspecified anemia type  Trichomonal cervicitis  IUP (intrauterine pregnancy), incidental    iup pregnancy noted on ultrasound. Pt treated her for trich. Pt is tolerating po with nausea. Pt was given zofran prescription earlier today. Discussed follow up with ob.    Teressa Lower, NP 06/14/15 2107  Courteney Randall An, MD 06/14/15 2350

## 2015-06-14 NOTE — ED Notes (Signed)
Pt given ginger ale and saltine crackers for po challenge. Pt still dry heaving at this point but willing to try po challenge.

## 2015-06-14 NOTE — ED Provider Notes (Signed)
CSN: [id number]     Arrival date & time 06/14/15  0126 History  By signing my name below, I, Freida Busmaniana Omoyeni, attest that this documentation has been prepared under the direction and in the presence of Loren Raceravid Taylr Meuth, MD . Electronically Signed: Freida Busmaniana Omoyeni, Scribe. 06/14/2015. 6:24 AM.  Chief Complaint  Patient presents with  . Abdominal Pain  . Chest Pain    The history is provided by the patient and the EMS personnel. No language interpreter was used.    HPI Comments:  Melanie Hancock is a 19 y.o. female brought in by ambulance, who presents to the Emergency Department complaining of nausea and vomiting for 1 week. Unable to answer questions due to actively vomiting  No alleviating factors noted.   Past Medical History  Diagnosis Date  . Infection     UTI  . UTI (lower urinary tract infection)   . Anxiety   . Anemia   . Pyelonephritis   . Chronic back pain    Past Surgical History  Procedure Laterality Date  . Induced abortion    . Dilation and curettage of uterus     Family History  Problem Relation Age of Onset  . Alcohol abuse Neg Hx    Social History  Substance Use Topics  . Smoking status: Never Smoker   . Smokeless tobacco: Never Used  . Alcohol Use: No   OB History    Gravida Para Term Preterm AB TAB SAB Ectopic Multiple Living   4 2 2  2 1 1   0 2     Review of Systems  Unable to perform ROS: Acuity of condition      Allergies  Review of patient's allergies indicates no known allergies.  Home Medications   Prior to Admission medications   Medication Sig Start Date End Date Taking? Authorizing Provider  diazepam (VALIUM) 5 MG tablet Take 1 tablet (5 mg total) by mouth every 8 (eight) hours as needed for muscle spasms. 05/01/15   Laurence Spatesachel Morgan Little, MD  ibuprofen (ADVIL,MOTRIN) 800 MG tablet Take 1,600 mg by mouth every 8 (eight) hours as needed for moderate pain.    Historical Provider, MD  ondansetron (ZOFRAN) 4 MG tablet Take 1 tablet (4 mg  total) by mouth every 8 (eight) hours as needed for nausea or vomiting. 06/14/15   Loren Raceravid Graycie Halley, MD  Prenatal Multivit-Min-Fe-FA (PRENATAL VITAMINS) 0.8 MG tablet Take 1 tablet by mouth daily. 06/14/15   Loren Raceravid Burnett Lieber, MD   BP 117/52 mmHg  Pulse 65  Temp(Src) 98.4 F (36.9 C) (Oral)  Resp 18  Ht 5\' 5"  (1.651 m)  Wt 179 lb (81.194 kg)  BMI 29.79 kg/m2  SpO2 98%  LMP 04/23/2015 Physical Exam  Constitutional: She is oriented to person, place, and time. She appears well-developed and well-nourished. She appears distressed.  HENT:  Head: Normocephalic and atraumatic.  Mouth/Throat: Oropharynx is clear and moist.  Eyes: EOM are normal. Pupils are equal, round, and reactive to light.  Neck: Normal range of motion. Neck supple.  Cardiovascular: Normal rate and regular rhythm.   Pulmonary/Chest: Effort normal and breath sounds normal. No respiratory distress. She has no wheezes. She has no rales. She exhibits no tenderness.  Abdominal: Soft. Bowel sounds are normal. She exhibits no distension and no mass. There is tenderness (very mild epigastric tenderness). There is no rebound and no guarding.  Musculoskeletal: Normal range of motion. She exhibits no edema or tenderness.  No CVA tenderness or lower extremity swelling or pain.  Neurological: She is alert and oriented to person, place, and time.  Moving all extremities without deficit. Sensation is fully intact.  Skin: Skin is warm and dry. No rash noted. No erythema.  Psychiatric:  Anxious  Nursing note and vitals reviewed.   ED Course  Procedures   DIAGNOSTIC STUDIES:  Oxygen Saturation is 100% on RA, normal by my interpretation.    COORDINATION OF CARE:  6:24 AM Discussed treatment plan with pt at bedside and pt agreed to plan.  Labs Review Labs Reviewed  CBC WITH DIFFERENTIAL/PLATELET - Abnormal; Notable for the following:    Hemoglobin 8.8 (*)    HCT 28.9 (*)    MCV 66.1 (*)    MCH 20.1 (*)    RDW 19.1 (*)    All  other components within normal limits  COMPREHENSIVE METABOLIC PANEL - Abnormal; Notable for the following:    CO2 21 (*)    All other components within normal limits  I-STAT BETA HCG BLOOD, ED ([facility], [facility], [facility] ONLY) - Abnormal; Notable for the following:    I-stat hCG, quantitative >2000.0 (*)    All other components within normal limits  LIPASE, BLOOD  URINALYSIS, ROUTINE W REFLEX MICROSCOPIC (NOT AT [facility])  I-STAT TROPOININ, ED    Imaging Review No results found. I have personally reviewed and evaluated these images and lab results as part of my medical decision-making.   EKG Interpretation None      MDM   Final diagnoses:  Vomiting of pregnancy    I personally performed the services described in this documentation, which was scribed in my presence. The recorded information has been reviewed and is accurate.   Patient is now more calm. She currently denies any abdominal or chest pain. She's having no urinary symptoms. No vaginal bleeding or discharge.  Vital signs stable in the emergency department. Labs without evidence of dehydration. Believe that anxiety is contributing to the patient's symptoms. We'll discharge home with antiemetic. Advised to follow-up with OB/GYN. Also start on multivitamin.  Loren Racer, MD 06/14/15 [phone]

## 2015-06-14 NOTE — ED Notes (Signed)
MD at bedside. 

## 2015-06-14 NOTE — Discharge Instructions (Signed)
Anemia, Nonspecific Anemia is a condition in which the concentration of red blood cells or hemoglobin in the blood is below normal. Hemoglobin is a substance in red blood cells that carries oxygen to the tissues of the body. Anemia results in not enough oxygen reaching these tissues.  CAUSES  Common causes of anemia include:   Excessive bleeding. Bleeding may be internal or external. This includes excessive bleeding from periods (in women) or from the intestine.   Poor nutrition.   Chronic kidney, thyroid, and liver disease.  Bone marrow disorders that decrease red blood cell production.  Cancer and treatments for cancer.  HIV, AIDS, and their treatments.  Spleen problems that increase red blood cell destruction.  Blood disorders.  Excess destruction of red blood cells due to infection, medicines, and autoimmune disorders. SIGNS AND SYMPTOMS   Minor weakness.   Dizziness.   Headache.  Palpitations.   Shortness of breath, especially with exercise.   Paleness.  Cold sensitivity.  Indigestion.  Nausea.  Difficulty sleeping.  Difficulty concentrating. Symptoms may occur suddenly or they may develop slowly.  DIAGNOSIS  Additional blood tests are often needed. These help your health care provider determine the best treatment. Your health care provider will check your stool for blood and look for other causes of blood loss.  TREATMENT  Treatment varies depending on the cause of the anemia. Treatment can include:   Supplements of iron, vitamin B12, or folic acid.   Hormone medicines.   A blood transfusion. This may be needed if blood loss is severe.   Hospitalization. This may be needed if there is significant continual blood loss.   Dietary changes.  Spleen removal. HOME CARE INSTRUCTIONS Keep all follow-up appointments. It often takes many weeks to correct anemia, and having your health care provider check on your condition and your response to  treatment is very important. SEEK IMMEDIATE MEDICAL CARE IF:   You develop extreme weakness, shortness of breath, or chest pain.   You become dizzy or have trouble concentrating.  You develop heavy vaginal bleeding.   You develop a rash.   You have bloody or black, tarry stools.   You faint.   You vomit up blood.   You vomit repeatedly.   You have abdominal pain.  You have a fever or persistent symptoms for more than 2-3 days.   You have a fever and your symptoms suddenly get worse.   You are dehydrated.  MAKE SURE YOU:  Understand these instructions.  Will watch your condition.  Will get help right away if you are not doing well or get worse.   This information is not intended to replace advice given to you by your health care provider. Make sure you discuss any questions you have with your health care provider.   Document Released: 09/13/2004 Document Revised: 04/08/2013 Document Reviewed: 01/30/2013 Elsevier Interactive Patient Education 2016 [employer]  Trichomoniasis Trichomoniasis is an infection caused by an organism called Trichomonas. The infection can affect both women and men. In women, the outer female genitalia and the vagina are affected. In men, the penis is mainly affected, but the prostate and other reproductive organs can also be involved. Trichomoniasis is a sexually transmitted infection (STI) and is most often passed to another person through sexual contact.  RISK FACTORS  Having unprotected sexual intercourse.  Having sexual intercourse with an infected partner. SIGNS AND SYMPTOMS  Symptoms of trichomoniasis in women include:  Abnormal gray-green frothy vaginal discharge.  Itching and irritation of  the vagina.  Itching and irritation of the area outside the vagina. Symptoms of trichomoniasis in men include:   Penile discharge with or without pain.  Pain during urination. This results from inflammation of the  urethra. DIAGNOSIS  Trichomoniasis may be found during a Pap test or physical exam. Your health care provider may use one of the following methods to help diagnose this infection:  Testing the pH of the vagina with a test tape.  Using a vaginal swab test that checks for the Trichomonas organism. A test is available that provides results within a few minutes.  Examining a urine sample.  Testing vaginal secretions. Your health care provider may test you for other STIs, including HIV. TREATMENT   You may be given medicine to fight the infection. Women should inform their health care provider if they could be or are pregnant. Some medicines used to treat the infection should not be taken during pregnancy.  Your health care provider may recommend over-the-counter medicines or creams to decrease itching or irritation.  Your sexual partner will need to be treated if infected.  Your health care provider may test you for infection again 3 months after treatment. HOME CARE INSTRUCTIONS   Take medicines only as directed by your health care provider.  Take over-the-counter medicine for itching or irritation as directed by your health care provider.  Do not have sexual intercourse while you have the infection.  Women should not douche or wear tampons while they have the infection.  Discuss your infection with your partner. Your partner may have gotten the infection from you, or you may have gotten it from your partner.  Have your sex partner get examined and treated if necessary.  Practice safe, informed, and protected sex.  See your health care provider for other STI testing. SEEK MEDICAL CARE IF:   You still have symptoms after you finish your medicine.  You develop abdominal pain.  You have pain when you urinate.  You have bleeding after sexual intercourse.  You develop a rash.  Your medicine makes you sick or makes you throw up (vomit). MAKE SURE YOU:  Understand these  instructions.  Will watch your condition.  Will get help right away if you are not doing well or get worse.   This information is not intended to replace advice given to you by your health care provider. Make sure you discuss any questions you have with your health care provider.   Document Released: 01/30/2001 Document Revised: 08/27/2014 Document Reviewed: 05/18/2013 Elsevier Interactive Patient Education [employer]

## 2015-06-14 NOTE — ED Notes (Signed)
Pt verbalized understanding of d/c instructions and has no further questions. Pt stable and NAD.  

## 2015-06-15 LAB — GC/CHLAMYDIA PROBE AMP (~~LOC~~) NOT AT ARMC
Chlamydia: NEGATIVE
Neisseria Gonorrhea: NEGATIVE

## 2015-06-16 ENCOUNTER — Emergency Department (HOSPITAL_COMMUNITY): Payer: Medicaid Other

## 2015-06-16 ENCOUNTER — Encounter (HOSPITAL_COMMUNITY): Payer: Self-pay | Admitting: *Deleted

## 2015-06-16 ENCOUNTER — Emergency Department (HOSPITAL_COMMUNITY)
Admission: EM | Admit: 2015-06-16 | Discharge: 2015-06-16 | Disposition: A | Discharge status: Home / Self Care | Payer: Medicaid Other | Attending: Emergency Medicine | Admitting: Emergency Medicine

## 2015-06-16 DIAGNOSIS — Z87891 Personal history of nicotine dependence: Secondary | ICD-10-CM | POA: Diagnosis not present

## 2015-06-16 DIAGNOSIS — O98811 Other maternal infectious and parasitic diseases complicating pregnancy, first trimester: Secondary | ICD-10-CM | POA: Diagnosis not present

## 2015-06-16 DIAGNOSIS — Z862 Personal history of diseases of the blood and blood-forming organs and certain disorders involving the immune mechanism: Secondary | ICD-10-CM | POA: Insufficient documentation

## 2015-06-16 DIAGNOSIS — Z3A01 Less than 8 weeks gestation of pregnancy: Secondary | ICD-10-CM | POA: Insufficient documentation

## 2015-06-16 DIAGNOSIS — O26899 Other specified pregnancy related conditions, unspecified trimester: Secondary | ICD-10-CM

## 2015-06-16 DIAGNOSIS — R109 Unspecified abdominal pain: Secondary | ICD-10-CM

## 2015-06-16 DIAGNOSIS — F419 Anxiety disorder, unspecified: Secondary | ICD-10-CM | POA: Diagnosis not present

## 2015-06-16 DIAGNOSIS — Z87440 Personal history of urinary (tract) infections: Secondary | ICD-10-CM | POA: Insufficient documentation

## 2015-06-16 DIAGNOSIS — R1084 Generalized abdominal pain: Secondary | ICD-10-CM

## 2015-06-16 DIAGNOSIS — A5901 Trichomonal vulvovaginitis: Secondary | ICD-10-CM | POA: Diagnosis not present

## 2015-06-16 DIAGNOSIS — R079 Chest pain, unspecified: Secondary | ICD-10-CM | POA: Diagnosis not present

## 2015-06-16 DIAGNOSIS — G8929 Other chronic pain: Secondary | ICD-10-CM | POA: Diagnosis not present

## 2015-06-16 DIAGNOSIS — M546 Pain in thoracic spine: Secondary | ICD-10-CM | POA: Diagnosis not present

## 2015-06-16 DIAGNOSIS — O99341 Other mental disorders complicating pregnancy, first trimester: Secondary | ICD-10-CM | POA: Diagnosis not present

## 2015-06-16 DIAGNOSIS — O99611 Diseases of the digestive system complicating pregnancy, first trimester: Secondary | ICD-10-CM | POA: Insufficient documentation

## 2015-06-16 DIAGNOSIS — Z3490 Encounter for supervision of normal pregnancy, unspecified, unspecified trimester: Secondary | ICD-10-CM

## 2015-06-16 DIAGNOSIS — O99351 Diseases of the nervous system complicating pregnancy, first trimester: Secondary | ICD-10-CM | POA: Diagnosis not present

## 2015-06-16 DIAGNOSIS — K2970 Gastritis, unspecified, without bleeding: Secondary | ICD-10-CM | POA: Insufficient documentation

## 2015-06-16 DIAGNOSIS — R1013 Epigastric pain: Secondary | ICD-10-CM

## 2015-06-16 DIAGNOSIS — O9989 Other specified diseases and conditions complicating pregnancy, childbirth and the puerperium: Secondary | ICD-10-CM | POA: Diagnosis present

## 2015-06-16 LAB — COMPREHENSIVE METABOLIC PANEL
ALT: 18 U/L (ref 14–54)
AST: 18 U/L (ref 15–41)
Albumin: 4.3 g/dL (ref 3.5–5.0)
Alkaline Phosphatase: 67 U/L (ref 38–126)
Anion gap: 10 (ref 5–15)
BUN: 15 mg/dL (ref 6–20)
CO2: 21 mmol/L — ABNORMAL LOW (ref 22–32)
Calcium: 10.1 mg/dL (ref 8.9–10.3)
Chloride: 106 mmol/L (ref 101–111)
Creatinine, Ser: 0.76 mg/dL (ref 0.44–1.00)
GFR calc Af Amer: >60 mL/min (ref >60)
GFR calc non Af Amer: >60 mL/min (ref >60)
Glucose, Bld: 83 mg/dL (ref 65–99)
Potassium: 3.2 mmol/L — ABNORMAL LOW (ref 3.5–5.1)
Sodium: 137 mmol/L (ref 135–145)
Total Bilirubin: 1 mg/dL (ref 0.3–1.2)
Total Protein: 8.2 g/dL — ABNORMAL HIGH (ref 6.5–8.1)

## 2015-06-16 LAB — URINALYSIS, ROUTINE W REFLEX MICROSCOPIC
Glucose, UA: NEGATIVE mg/dL
Hgb urine dipstick: NEGATIVE
Ketones, ur: >80 mg/dL — AB
Nitrite: NEGATIVE
Protein, ur: 30 mg/dL — AB
Specific Gravity, Urine: 1.035 — ABNORMAL HIGH (ref 1.005–1.030)
Urobilinogen, UA: 4 mg/dL — ABNORMAL HIGH (ref 0.0–1.0)
pH: 7 (ref 5.0–8.0)

## 2015-06-16 LAB — CBC
HCT: 33.6 % — ABNORMAL LOW (ref 36.0–46.0)
Hemoglobin: 10.2 g/dL — ABNORMAL LOW (ref 12.0–15.0)
MCH: 19.9 pg — ABNORMAL LOW (ref 26.0–34.0)
MCHC: 30.4 g/dL (ref 30.0–36.0)
MCV: 65.5 fL — ABNORMAL LOW (ref 78.0–100.0)
Platelets: 388 10*3/uL (ref 150–400)
RBC: 5.13 MIL/uL — ABNORMAL HIGH (ref 3.87–5.11)
RDW: 19.3 % — ABNORMAL HIGH (ref 11.5–15.5)
WBC: 6.8 10*3/uL (ref 4.0–10.5)

## 2015-06-16 LAB — URINE MICROSCOPIC-ADD ON

## 2015-06-16 LAB — LIPASE, BLOOD: Lipase: 22 U/L (ref 11–51)

## 2015-06-16 MED ORDER — ACETAMINOPHEN 500 MG PO TABS
1000.0000 mg | ORAL_TABLET | Freq: Once | ORAL | Status: AC
  Administered 2015-06-16: 1000 mg via ORAL
  Filled 2015-06-16: qty 2

## 2015-06-16 MED ORDER — FAMOTIDINE IN NACL 20-0.9 MG/50ML-% IV SOLN
20.0000 mg | Freq: Once | INTRAVENOUS | Status: AC
  Administered 2015-06-16: 20 mg via INTRAVENOUS
  Filled 2015-06-16: qty 50

## 2015-06-16 MED ORDER — DEXTROSE 5 % IV SOLN
1.0000 g | Freq: Once | INTRAVENOUS | Status: AC
  Administered 2015-06-16: 1 g via INTRAVENOUS
  Filled 2015-06-16: qty 10

## 2015-06-16 MED ORDER — SODIUM CHLORIDE 0.9 % IV BOLUS (SEPSIS)
1000.0000 mL | Freq: Once | INTRAVENOUS | Status: AC
  Administered 2015-06-16: 1000 mL via INTRAVENOUS

## 2015-06-16 MED ORDER — ONDANSETRON 4 MG PO TBDP
ORAL_TABLET | ORAL | Status: AC
  Filled 2015-06-16: qty 1

## 2015-06-16 MED ORDER — PROMETHAZINE HCL 25 MG RE SUPP: 25.0000 mg | Freq: Four times a day (QID) | RECTAL | Status: DC | PRN

## 2015-06-16 MED ORDER — FAMOTIDINE 20 MG PO TABS: 20.0000 mg | ORAL_TABLET | Freq: Two times a day (BID) | ORAL | Status: DC

## 2015-06-16 MED ORDER — ONDANSETRON 4 MG PO TBDP
4.0000 mg | ORAL_TABLET | Freq: Once | ORAL | Status: AC
  Administered 2015-06-16: 4 mg via ORAL

## 2015-06-16 NOTE — ED Notes (Signed)
PT states here with abdominal pain for one week and reports back and chest pain for 3 days.  Pt states she had an ultrasound that showed she is 4-5 weeks. Pt was told she had trich and she cannot take the po anbx.  Pt wants to have chest xray on right side and goes through to back

## 2015-06-16 NOTE — ED Notes (Signed)
Charge Nurse came and  advised triage staff that pt had called 911 from lobby and [facility] Triage staff found pt laying in floor in lobby.  When ask did pt call 911 she began to dry heave uncontroablly. Pt was placed in wheelchair and taken back to triage to be reassessed by nurse.

## 2015-06-16 NOTE — ED Notes (Signed)
Pt was upset that she could not wait in the room for her ride to arrive prior to discharge.  RN explained that EMS was coming and that the room was needed.

## 2015-06-16 NOTE — Discharge Instructions (Signed)

## 2015-06-16 NOTE — ED Provider Notes (Addendum)
CSN: [id number]     Arrival date & time 06/16/15  1358 History   First MD Initiated Contact with Patient 06/16/15 1631     Chief Complaint  Patient presents with  . Abdominal Pain  . Back Pain  . Chest Pain     (Consider location/radiation/quality/duration/timing/severity/associated sxs/prior Treatment) HPI Patient states that she is [redacted] weeks pregnant. She reports she was just seen in the emergency department this morning and diagnosed with Trichomonas. (EMR review identifies negative GC chlamydia from 10/25). She reports that she is vomiting and she can't keep her antibiotics down. She reports she has pain all throughout her abdomen and chest and thoracic back. She reports that she gets "chronic back pain" when she is pregnant. No fever, no cough, no shortness of breath. Patient poor she is cold and is awaiting her sister to bring her a blanket from home. Past Medical History  Diagnosis Date  . Infection     UTI  . UTI (lower urinary tract infection)   . Anxiety   . Anemia   . Pyelonephritis   . Chronic back pain    Past Surgical History  Procedure Laterality Date  . Induced abortion    . Dilation and curettage of uterus     Family History  Problem Relation Age of Onset  . Alcohol abuse Neg Hx    Social History  Substance Use Topics  . Smoking status: Former Games developermoker  . Smokeless tobacco: Never Used  . Alcohol Use: No   OB History    Gravida Para Term Preterm AB TAB SAB Ectopic Multiple Living   4 2 2  2 1 1   0 2     Review of Systems 10 Systems reviewed and are negative for acute change except as noted in the HPI.   Allergies  Review of patient's allergies indicates no known allergies.  Home Medications   Prior to Admission medications   Not on File   BP 132/68 mmHg  Pulse 45  Temp(Src) 98.2 F (36.8 C) (Oral)  Resp 20  SpO2 100%  LMP 04/23/2015 Physical Exam  Constitutional: She is oriented to person, place, and time. She appears well-developed and  well-nourished.  Patient has clinically well appearance.  HENT:  Head: Normocephalic and atraumatic.  Right Ear: External ear normal.  Left Ear: External ear normal.  Nose: Nose normal.  Mouth/Throat: Oropharynx is clear and moist.  Eyes: EOM are normal. Pupils are equal, round, and reactive to light.  Neck: Neck supple.  Cardiovascular: Normal rate, regular rhythm, normal heart sounds and intact distal pulses.   Pulmonary/Chest: Effort normal and breath sounds normal.  Abdominal: Soft. Bowel sounds are normal. She exhibits no distension. There is tenderness.  Abdomen is completely soft however she does endorse pain to palpation throughout the entirety of her abdomen.  Musculoskeletal: Normal range of motion. She exhibits no edema.  Patient has normal visual inspection of the back. She sits forward appropriately. With light palpation along both sides of the thorax patient winces and complains of severe pain. There is no palpable abnormality. Her back has normal contours. No rashes present. No lower extremity edema. The calves are soft and nontender.  Neurological: She is alert and oriented to person, place, and time. She has normal strength. No cranial nerve deficit. She exhibits normal muscle tone. Coordination normal. GCS eye subscore is 4. GCS verbal subscore is 5. GCS motor subscore is 6.  Skin: Skin is warm, dry and intact.  Psychiatric:  Patient is  slightly tearful and anxious.    ED Course  Procedures (including critical care time) Labs Review Labs Reviewed  COMPREHENSIVE METABOLIC PANEL - Abnormal; Notable for the following:    Potassium 3.2 (*)    CO2 21 (*)    Total Protein 8.2 (*)    All other components within normal limits  CBC - Abnormal; Notable for the following:    RBC 5.13 (*)    Hemoglobin 10.2 (*)    HCT 33.6 (*)    MCV 65.5 (*)    MCH 19.9 (*)    RDW 19.3 (*)    All other components within normal limits  URINALYSIS, ROUTINE W REFLEX MICROSCOPIC (NOT AT  [facility]) - Abnormal; Notable for the following:    Color, Urine AMBER (*)    APPearance CLOUDY (*)    Specific Gravity, Urine 1.035 (*)    Bilirubin Urine SMALL (*)    Ketones, ur >80 (*)    Protein, ur 30 (*)    Urobilinogen, UA 4.0 (*)    Leukocytes, UA MODERATE (*)    All other components within normal limits  URINE MICROSCOPIC-ADD ON - Abnormal; Notable for the following:    Squamous Epithelial / LPF MANY (*)    Bacteria, UA FEW (*)    All other components within normal limits  URINE CULTURE  LIPASE, BLOOD    Imaging Review US Abdomen Limited  06/16/2015  CLINICAL DATA:  Acute epigastric abdominal pain. EXAM: US ABDOMEN LIMITED - RIGHT UPPER QUADRANT COMPARISON:  None. FINDINGS: Gallbladder: No gallstones or wall thickening visualized. No sonographic Murphy sign noted. Common bile duct: Diameter: 3.5 mm which is within normal limits. Liver: No focal lesion identified. Within normal limits in parenchymal echogenicity. IMPRESSION: No abnormality seen in the right upper quadrant of the abdomen. Electronically Signed   By: Lupita Raider, M.D.   On: 06/16/2015 19:38   I have personally reviewed and evaluated these images and lab results as part of my medical decision-making.    MDM   Final diagnoses:  Early stage of pregnancy  Generalized abdominal pain  Chest pain, unspecified chest pain type   Patient has well appearance. She is alert and has no respiratory distress. Vital signs are stable. Patient may be experiencing hyperemesis gravidarum. She did not have active vomiting in the emergency department. She reports she she has had significant amount of vomiting at home. Upon recheck in the emergency department, she is resting quietly with no discomfort and no signs of distress. Her urine specimen is somewhat contaminated but due to pregnancy and complaints of vomiting she will be given one dose of Rocephin with a urine culture pending. Urine from 2 days ago did not show acute  infection. I do not believe she has evidence of pyelonephritis. She reports vomiting from metronidazole. At this time, I will not have her continue metronidazole she can discuss further treatment with her GYN based on symptoms. He is advised her GYN follow-up within 1-3 days.    Arby Barrette, MD 06/16/15 1610  Arby Barrette, MD 06/16/15 2038

## 2015-06-16 NOTE — ED Notes (Signed)
Patient crawled out of wheelchair and lying in floor in waiting room. Helped back to sitting position in wheelchair.

## 2015-06-17 DIAGNOSIS — A599 Trichomoniasis, unspecified: Secondary | ICD-10-CM | POA: Insufficient documentation

## 2015-06-18 LAB — URINE CULTURE

## 2015-07-16 ENCOUNTER — Other Ambulatory Visit: Payer: Self-pay | Admitting: Obstetrics and Gynecology

## 2015-07-16 ENCOUNTER — Inpatient Hospital Stay (HOSPITAL_COMMUNITY)
Admission: AD | Admit: 2015-07-16 | Discharge: 2015-07-20 | DRG: 781 | Disposition: A | Discharge status: Home / Self Care | Payer: Medicaid Other | Source: Ambulatory Visit | Attending: Obstetrics & Gynecology | Admitting: Obstetrics & Gynecology

## 2015-07-16 DIAGNOSIS — Z87891 Personal history of nicotine dependence: Secondary | ICD-10-CM

## 2015-07-16 DIAGNOSIS — R1110 Vomiting, unspecified: Secondary | ICD-10-CM | POA: Diagnosis present

## 2015-07-16 DIAGNOSIS — O99011 Anemia complicating pregnancy, first trimester: Secondary | ICD-10-CM | POA: Diagnosis present

## 2015-07-16 DIAGNOSIS — D509 Iron deficiency anemia, unspecified: Secondary | ICD-10-CM | POA: Diagnosis present

## 2015-07-16 DIAGNOSIS — O210 Mild hyperemesis gravidarum: Principal | ICD-10-CM | POA: Diagnosis present

## 2015-07-16 DIAGNOSIS — B958 Unspecified staphylococcus as the cause of diseases classified elsewhere: Secondary | ICD-10-CM | POA: Diagnosis present

## 2015-07-16 DIAGNOSIS — L739 Follicular disorder, unspecified: Secondary | ICD-10-CM | POA: Diagnosis present

## 2015-07-16 DIAGNOSIS — Z3A11 11 weeks gestation of pregnancy: Secondary | ICD-10-CM

## 2015-07-16 DIAGNOSIS — O2341 Unspecified infection of urinary tract in pregnancy, first trimester: Secondary | ICD-10-CM | POA: Diagnosis present

## 2015-07-16 DIAGNOSIS — L0292 Furuncle, unspecified: Secondary | ICD-10-CM | POA: Diagnosis present

## 2015-07-16 MED ORDER — ONDANSETRON HCL 4 MG/2ML IJ SOLN
4.0000 mg | Freq: Four times a day (QID) | INTRAMUSCULAR | Status: DC
Start: 2015-07-17 — End: 2015-07-20
  Administered 2015-07-17 – 2015-07-20 (×15): 4 mg via INTRAVENOUS
  Filled 2015-07-16 (×15): qty 2

## 2015-07-16 MED ORDER — LACTATED RINGERS IV BOLUS (SEPSIS)
500.0000 mL | Freq: Once | INTRAVENOUS | Status: AC
  Administered 2015-07-17: 500 mL via INTRAVENOUS

## 2015-07-16 MED ORDER — PANTOPRAZOLE SODIUM 40 MG IV SOLR
40.0000 mg | INTRAVENOUS | Status: DC
Start: 2015-07-17 — End: 2015-07-20
  Administered 2015-07-17 – 2015-07-19 (×4): 40 mg via INTRAVENOUS
  Filled 2015-07-16 (×5): qty 40

## 2015-07-16 MED ORDER — PROMETHAZINE HCL 25 MG/ML IJ SOLN
25.0000 mg | Freq: Four times a day (QID) | INTRAMUSCULAR | Status: DC
  Administered 2015-07-17: 12.5 mg via INTRAVENOUS
  Administered 2015-07-17 (×2): 25 mg via INTRAVENOUS
  Filled 2015-07-16 (×3): qty 1

## 2015-07-16 MED ORDER — LACTATED RINGERS IV SOLN
INTRAVENOUS | Status: DC
  Administered 2015-07-17: 01:00:00 via INTRAVENOUS

## 2015-07-17 ENCOUNTER — Encounter (HOSPITAL_COMMUNITY): Payer: Self-pay | Admitting: Anesthesiology

## 2015-07-17 ENCOUNTER — Encounter (HOSPITAL_COMMUNITY): Payer: Self-pay

## 2015-07-17 DIAGNOSIS — R1110 Vomiting, unspecified: Secondary | ICD-10-CM | POA: Diagnosis present

## 2015-07-17 LAB — URINALYSIS, ROUTINE W REFLEX MICROSCOPIC
Bilirubin Urine: NEGATIVE
Bilirubin Urine: NEGATIVE
Glucose, UA: NEGATIVE mg/dL
Glucose, UA: NEGATIVE mg/dL
Hgb urine dipstick: NEGATIVE
Hgb urine dipstick: NEGATIVE
Ketones, ur: >80 mg/dL — AB
Ketones, ur: >80 mg/dL — AB
Nitrite: POSITIVE — AB
Nitrite: POSITIVE — AB
Protein, ur: NEGATIVE mg/dL
Protein, ur: NEGATIVE mg/dL
Specific Gravity, Urine: 1.02 (ref 1.005–1.030)
Specific Gravity, Urine: 1.02 (ref 1.005–1.030)
pH: 7.5 (ref 5.0–8.0)
pH: 7.5 (ref 5.0–8.0)

## 2015-07-17 LAB — CBC WITH DIFFERENTIAL/PLATELET
Basophils Absolute: 0 10*3/uL (ref 0.0–0.1)
Basophils Relative: 0 %
Eosinophils Absolute: 0.1 10*3/uL (ref 0.0–0.7)
Eosinophils Relative: 1 %
HCT: 27.5 % — ABNORMAL LOW (ref 36.0–46.0)
Hemoglobin: 8.6 g/dL — ABNORMAL LOW (ref 12.0–15.0)
Lymphocytes Relative: 11 %
Lymphs Abs: 1 10*3/uL (ref 0.7–4.0)
MCH: 20.5 pg — ABNORMAL LOW (ref 26.0–34.0)
MCHC: 31.3 g/dL (ref 30.0–36.0)
MCV: 65.5 fL — ABNORMAL LOW (ref 78.0–100.0)
Monocytes Absolute: 0.5 10*3/uL (ref 0.1–1.0)
Monocytes Relative: 5 %
Neutro Abs: 7.5 10*3/uL (ref 1.7–7.7)
Neutrophils Relative %: 83 %
Platelets: 345 10*3/uL (ref 150–400)
RBC: 4.2 MIL/uL (ref 3.87–5.11)
RDW: 21.1 % — ABNORMAL HIGH (ref 11.5–15.5)
WBC: 9.1 10*3/uL (ref 4.0–10.5)

## 2015-07-17 LAB — COMPREHENSIVE METABOLIC PANEL
ALT: 16 U/L (ref 14–54)
AST: 18 U/L (ref 15–41)
Albumin: 4 g/dL (ref 3.5–5.0)
Alkaline Phosphatase: 56 U/L (ref 38–126)
Anion gap: 9 (ref 5–15)
BUN: 8 mg/dL (ref 6–20)
CO2: 21 mmol/L — ABNORMAL LOW (ref 22–32)
Calcium: 9.1 mg/dL (ref 8.9–10.3)
Chloride: 104 mmol/L (ref 101–111)
Creatinine, Ser: 0.58 mg/dL (ref 0.44–1.00)
GFR calc Af Amer: >60 mL/min (ref >60)
GFR calc non Af Amer: >60 mL/min (ref >60)
Glucose, Bld: 96 mg/dL (ref 65–99)
Potassium: 3.7 mmol/L (ref 3.5–5.1)
Sodium: 134 mmol/L — ABNORMAL LOW (ref 135–145)
Total Bilirubin: 0.6 mg/dL (ref 0.3–1.2)
Total Protein: 7.8 g/dL (ref 6.5–8.1)

## 2015-07-17 LAB — URINE MICROSCOPIC-ADD ON
RBC / HPF: NONE SEEN RBC/hpf (ref 0–5)
RBC / HPF: NONE SEEN RBC/hpf (ref 0–5)

## 2015-07-17 MED ORDER — CEFAZOLIN SODIUM-DEXTROSE 2-3 GM-% IV SOLR
2.0000 g | Freq: Three times a day (TID) | INTRAVENOUS | Status: DC
  Administered 2015-07-17 – 2015-07-20 (×11): 2 g via INTRAVENOUS
  Filled 2015-07-17 (×13): qty 50

## 2015-07-17 MED ORDER — ZOLPIDEM TARTRATE 5 MG PO TABS: 5.0000 mg | ORAL_TABLET | Freq: Every evening | ORAL | Status: DC | PRN

## 2015-07-17 MED ORDER — LACTATED RINGERS IV SOLN
INTRAVENOUS | Status: DC
  Administered 2015-07-17 – 2015-07-19 (×6): via INTRAVENOUS

## 2015-07-17 MED ORDER — PROMETHAZINE HCL 25 MG/ML IJ SOLN
12.5000 mg | Freq: Four times a day (QID) | INTRAMUSCULAR | Status: DC | PRN
Start: 2015-07-17 — End: 2015-07-20
  Administered 2015-07-17 – 2015-07-20 (×3): 12.5 mg via INTRAVENOUS
  Filled 2015-07-17 (×3): qty 1

## 2015-07-17 MED ORDER — SODIUM CHLORIDE 0.9 % IJ SOLN: 3.0000 mL | INTRAMUSCULAR | Status: DC | PRN

## 2015-07-17 MED ORDER — PRENATAL MULTIVITAMIN CH
1.0000 | ORAL_TABLET | Freq: Every day | ORAL | Status: DC
  Administered 2015-07-18 – 2015-07-20 (×2): 1 via ORAL
  Filled 2015-07-17 (×4): qty 1

## 2015-07-17 MED ORDER — LACTATED RINGERS IV SOLN
INTRAVENOUS | Status: DC
  Administered 2015-07-17 (×3): via INTRAVENOUS

## 2015-07-17 NOTE — Progress Notes (Signed)
Encouraged patient to order something to eat or drink.  Patient turned on her side and went back to sleep.  Took ginger-ale to patient and encouraged her to drink slowly.

## 2015-07-17 NOTE — Progress Notes (Signed)
IV SITE IN RIGHT HAND SWOLLEN AND PAINFUL. IV D/CED. Marquis BuggyAROLINE JONES RN FROM [facility]TENATAL ASSESSED IV PLACEMENT. ALETA HARPER RN ATTEMPTED X1 TO RESTART IV. JANET CRNA RESTARTED IV IN LEFT HAND AT 2225

## 2015-07-17 NOTE — Progress Notes (Signed)
Received report and assumed care of Melanie Hancock, 19 yo [id number] admitted less than 24 hours ago with hyperemesis.  S: Sleeping, not easily aroused. Reports no appetite and no po food/fluids since admission. States vomited 3 times today that filled the bottom of the pink basin. Questioned re: time of day/amount of vomiting, states "I don't know." Denies VB or change in vaginal discharge.  O:  Today's Vitals   07/17/15 0600 07/17/15 0842 07/17/15 1126 07/17/15 1728  BP: 115/70  117/74 122/74  Pulse: 59  62 60  Temp: 98.6 F (37 C)  98.7 F (37.1 C) 98.8 F (37.1 C)  TempSrc: Oral  Oral Oral  Resp: 18  18 18   Height:  5\' 6"  (1.676 m)    Weight:  71.385 kg (157 lb 6 oz)    SpO2: 100%  99% 100%  PainSc: Asleep      OOB to void 275 ml at 19:30 PM Last received ATB, Zofran & Phenergan at 17:53 PM  2 of the 3 vomiting episodes witnessed by RN and are as follows:  - 100 ml at 1700  - 75 ml at 1800  FHR: 164 bpm at 18:20 PM  A: 19 yo [id number] @ 11+ wks Hyperemesis IDA Currently undergoing treatment for UTI and folliculitis  P: Continue current plan Await wound and urine cultures Strongly encouraged po attempt this evening, beginning w/ ice chips   Sherre ScarletKimberly Keturah Yerby, CNM 07/17/15, 8 PM

## 2015-07-17 NOTE — Progress Notes (Addendum)
Currently asleep.  On MBU, since 3rd floor closed due to no census.  Filed Vitals:   07/16/15 2359 07/17/15 0150 07/17/15 0230 07/17/15 0600  BP: 126/83 114/50 119/60 115/70  Pulse: 83 71 79 59  Temp: 98.2 F (36.8 C) 99.3 F (37.4 C) 98.3 F (36.8 C) 98.6 F (37 C)  TempSrc: Oral Oral Oral Oral  Resp: 20 16 20 18   Weight:   71.385 kg (157 lb 6 oz)   SpO2: 100%  100% 100%   No vomiting during night.  400 cc urine output during night.  Results for orders placed or performed during the hospital encounter of 07/16/15 (from the past 24 hour(s))  Urinalysis, Routine w reflex microscopic (not at [facility])     Status: Abnormal   Collection Time: 07/17/15 12:35 AM  Result Value Ref Range   Color, Urine YELLOW YELLOW   APPearance TURBID (A) CLEAR   Specific Gravity, Urine 1.020 1.005 - 1.030   pH 7.5 5.0 - 8.0   Glucose, UA NEGATIVE NEGATIVE mg/dL   Hgb urine dipstick NEGATIVE NEGATIVE   Bilirubin Urine NEGATIVE NEGATIVE   Ketones, ur >80 (A) NEGATIVE mg/dL   Protein, ur NEGATIVE NEGATIVE mg/dL   Nitrite POSITIVE (A) NEGATIVE   Leukocytes, UA MODERATE (A) NEGATIVE  Urine microscopic-add on     Status: Abnormal   Collection Time: 07/17/15 12:35 AM  Result Value Ref Range   Squamous Epithelial / LPF 0-5 (A) NONE SEEN   WBC, UA 6-30 0 - 5 WBC/hpf   RBC / HPF NONE SEEN 0 - 5 RBC/hpf   Bacteria, UA MANY (A) NONE SEEN   Urine-Other MUCOUS PRESENT   Comprehensive metabolic panel     Status: Abnormal   Collection Time: 07/17/15  5:25 AM  Result Value Ref Range   Sodium 134 (L) 135 - 145 mmol/L   Potassium 3.7 3.5 - 5.1 mmol/L   Chloride 104 101 - 111 mmol/L   CO2 21 (L) 22 - 32 mmol/L   Glucose, Bld 96 65 - 99 mg/dL   BUN 8 6 - 20 mg/dL   Creatinine, Ser 5.780.58 0.44 - 1.00 mg/dL   Calcium 9.1 8.9 - 46.910.3 mg/dL   Total Protein 7.8 6.5 - 8.1 g/dL   Albumin 4.0 3.5 - 5.0 g/dL   AST 18 15 - 41 U/L   ALT 16 14 - 54 U/L   Alkaline Phosphatase 56 38 - 126 U/L   Total Bilirubin 0.6 0.3 -  1.2 mg/dL   GFR calc non Af Amer >60 >60 mL/min   GFR calc Af Amer >60 >60 mL/min   Anion gap 9 5 - 15  CBC WITH DIFFERENTIAL     Status: Abnormal   Collection Time: 07/17/15  5:25 AM  Result Value Ref Range   WBC 9.1 4.0 - 10.5 K/uL   RBC 4.20 3.87 - 5.11 MIL/uL   Hemoglobin 8.6 (L) 12.0 - 15.0 g/dL   HCT 62.927.5 (L) 52.836.0 - 41.346.0 %   MCV 65.5 (L) 78.0 - 100.0 fL   MCH 20.5 (L) 26.0 - 34.0 pg   MCHC 31.3 30.0 - 36.0 g/dL   RDW 24.421.1 (H) 01.011.5 - 27.215.5 %   Platelets 345 150 - 400 K/uL   Neutrophils Relative % 83 %   Neutro Abs 7.5 1.7 - 7.7 K/uL   Lymphocytes Relative 11 %   Lymphs Abs 1.0 0.7 - 4.0 K/uL   Monocytes Relative 5 %   Monocytes Absolute 0.5 0.1 - 1.0  K/uL   Eosinophils Relative 1 %   Eosinophils Absolute 0.1 0.0 - 0.7 K/uL   Basophils Relative 0 %   Basophils Absolute 0.0 0.0 - 0.1 K/uL   UA pending for this am for recheck of ketones.  Marland Kitchen  ceFAZolin (ANCEF) IV  2 g Intravenous Q8H  . ondansetron (ZOFRAN) IV  4 mg Intravenous 4 times per day  . pantoprazole (PROTONIX) IV  40 mg Intravenous Q24H  . prenatal multivitamin  1 tablet Oral Q1200  . promethazine  25 mg Intravenous Q6H   Has received 1 dose of Ancef at 0143.  Urine culture pending. Wound culture pending.  Will observe this am for tolerance of po intake. If tolerant of po intake today, may consider changing meds to po for antiemetics and ATB for UTI/folliculitis.  Nigel Bridgeman, CNM 07/17/15 [phone]

## 2015-07-17 NOTE — Progress Notes (Signed)
Pt sleeping. 

## 2015-07-17 NOTE — Progress Notes (Signed)
Explained to patient that we need for her to vomit in a basin or green emesis bag so that we can measure her emesis.  Patient continues to vomit in trash can and refuses to use emesis bag or basin.

## 2015-07-17 NOTE — H&P (Signed)
Melanie Hancock is a 19 y.o. female, [id number] at 7 1/7 weeks by [place], presenting for N/V x 24 hours, with inability to keep down food or fluids.  She is also c/o dysuria and frequency, with some low back pain, and has an "infected hair bump" on her labia (present x 1 week).  Denies fever or abdominal pain. Hx   Seen at [facility] ER on 10/25 x 2 for N/V, given Rx for Zofran, treated with IV Metronidazole for trich on wet prep.  US showed SIUP, 6 5/7 weeks, FHR 122.  GC/chlamydia negative 11/25. Seen at [facility] ER on 10/27 for abdominal, chest, and back pain--received Rocephin due to suspicion of UTI, but negative culture resulted. Seen at [facility] 06/28/15 in f/u--Wet prep + for BV, negative for trich.  Rx Metronidazole course. Called office 11/16 to request Rx for Zofran, but "pharmacy doesn't have it yet".  Has not been on any anti-emetics at home.  Patient Active Problem List   Diagnosis Date Noted  . Hyperemesis 07/17/2015  . Anxiety 03/28/2014  . Chronic back pain 03/28/2014  . Anemia--Hgb 8.8 on 06/14/15 05/26/2013  . Hx pyelonephritis 2014 05/26/2013     OB History    Gravida Para Term Preterm AB TAB SAB Ectopic Multiple Living   0 2     Past Medical History  Diagnosis Date  . Infection     UTI  . UTI (lower urinary tract infection)   . Anxiety   . Anemia   . Pyelonephritis   . Chronic back pain    Blood transfusion 2014 due to Hgb 6.5.   Past Surgical History  Procedure Laterality Date  . Induced abortion    . Dilation and curettage of uterus     Family History: family history is negative for Alcohol abuse.   Social History:  reports that she has quit smoking. She has never used smokeless tobacco. She reports that she does not drink alcohol or use illicit drugs.   ROS:  N/V, back pain, dysuria and frequency, pain from lesion on mons  No Known Allergies     Blood pressure 126/83, pulse 83, temperature 98.2 F (36.8 C), temperature source Oral, resp. rate 20,  last menstrual period 04/23/2015, SpO2 100 %, not currently breastfeeding.   Weight 157 today (159.5 at office visit 06/28/15) In NAD, but appears fatigued, lips dry and cracked Chest clear Heart RRR without murmur Abd gravid, NT, FH 11-12 weeks, FHR 175 Pelvic: Deferred.  Draining lesion on left side of mons--able to extrude small droplets of cloudy fluid from site, no odor.  Indurated area around draining site measures approx 2 cm.  No erythema of area. Negative CVAT Ext: WNL  FHR: 175 per doppler  Results for orders placed or performed during the hospital encounter of 07/16/15 (from the past 24 hour(s))  Urinalysis, Routine w reflex microscopic (not at [facility])     Status: Abnormal   Collection Time: 07/17/15 12:35 AM  Result Value Ref Range   Color, Urine YELLOW YELLOW   APPearance TURBID (A) CLEAR   Specific Gravity, Urine 1.020 1.005 - 1.030   pH 7.5 5.0 - 8.0   Glucose, UA NEGATIVE NEGATIVE mg/dL   Hgb urine dipstick NEGATIVE NEGATIVE   Bilirubin Urine NEGATIVE NEGATIVE   Ketones, ur >80 (A) NEGATIVE mg/dL   Protein, ur NEGATIVE NEGATIVE mg/dL   Nitrite POSITIVE (A) NEGATIVE   Leukocytes, UA MODERATE (A) NEGATIVE  Urine microscopic-add on  Status: Abnormal   Collection Time: 07/17/15 12:35 AM  Result Value Ref Range   Squamous Epithelial / LPF 0-5 (A) NONE SEEN   WBC, UA 6-30 0 - 5 WBC/hpf   RBC / HPF NONE SEEN 0 - 5 RBC/hpf   Bacteria, UA MANY (A) NONE SEEN   Urine-Other MUCOUS PRESENT     Prenatal labs: ABO, Rh:  A+   Assessment/Plan: IUP at 11 1/7 weeks Hyperemesis UTI Folliculitis Hx chronic anemia Closely-spaced pregnancies  [facility] with Dr. Sallye OberKulwa IV hydration Ancef IV for initial UTI treatment and folliculitis Wound culture  CBC, diff, CMP in am Zofran and Phenergan for anti-emetics Protonix for reflux Urine to culture  Vicy Medico, [place], [place] 07/17/2015, 1:49 AM

## 2015-07-17 NOTE — MAU Note (Signed)
Headache for 3 days, not right now.  Upper abd pain x 3 days.  Back pain.   Vomiting today. Thinks has a UTI.

## 2015-07-17 NOTE — Progress Notes (Signed)
Primary nurse called to report the pt was winning, holding her back like she was in pain but would not talk to her at all.  She gave her a heat pack for her back and her scheduled zofran and phenergan.  She was encouraged to eat and/or drink, but she refused.  The pt went back to sleep.  I just check on her and she is still asleep.  OK to keep IVF at 125cc/hr

## 2015-07-17 NOTE — Progress Notes (Signed)
PATIENT COMPLAINED OF AB PAIN. ENCOURAGED PATIENT TO ORDER FOOD BECAUSE SHE HAS NOT ATE ALL DAY. PATIENT CALLED AND ORDERED APPLESAUCE AND FRUIT LOOPS.

## 2015-07-17 NOTE — Progress Notes (Signed)
Spoke with nurse: patient has slept all morning and very difficult to wake up. Is receiving Phenergan 25 mg IV around the clock.  New orders given:  1. Saline lock IV 2. IV Zofran around the clock 3. Phenergan changed to 12.5 mg iv every 6 hours as needed 4. Advance diet to bland 5. Warm moist compresses on mons 30 minutes q shift ( negative for WC and Bacteria)

## 2015-07-17 NOTE — Progress Notes (Signed)
OFFERED WARM COMPRESS FOR LABIA-PATIENT DECLINED AT THIS TIME.

## 2015-07-17 NOTE — Progress Notes (Signed)
Warm, moist compress applied to area on left labia x 30 minutes.  Patient tolerated well.

## 2015-07-18 DIAGNOSIS — L739 Follicular disorder, unspecified: Secondary | ICD-10-CM | POA: Diagnosis present

## 2015-07-18 DIAGNOSIS — B958 Unspecified staphylococcus as the cause of diseases classified elsewhere: Secondary | ICD-10-CM | POA: Diagnosis present

## 2015-07-18 DIAGNOSIS — O210 Mild hyperemesis gravidarum: Secondary | ICD-10-CM | POA: Diagnosis not present

## 2015-07-18 DIAGNOSIS — Z3A11 11 weeks gestation of pregnancy: Secondary | ICD-10-CM | POA: Diagnosis not present

## 2015-07-18 DIAGNOSIS — O2341 Unspecified infection of urinary tract in pregnancy, first trimester: Secondary | ICD-10-CM | POA: Diagnosis present

## 2015-07-18 DIAGNOSIS — D509 Iron deficiency anemia, unspecified: Secondary | ICD-10-CM | POA: Diagnosis present

## 2015-07-18 DIAGNOSIS — Z87891 Personal history of nicotine dependence: Secondary | ICD-10-CM | POA: Diagnosis not present

## 2015-07-18 DIAGNOSIS — O99011 Anemia complicating pregnancy, first trimester: Secondary | ICD-10-CM | POA: Diagnosis present

## 2015-07-18 DIAGNOSIS — L0292 Furuncle, unspecified: Secondary | ICD-10-CM | POA: Diagnosis present

## 2015-07-18 NOTE — Progress Notes (Signed)
Patient ID:  Pt without complaints of n/v  BP 136/73 mmHg  Pulse 63  Temp(Src) 98.9 F (37.2 C) (Oral)  Resp 20  Ht _0  (1.676 m)  Wt 154 lb 4 oz (69.967 kg)  BMI 24.91 kg/m2  SpO2 100%  LMP 04/23/2015  Breastfeeding? No  FHTS 145  Toco none  Pt in NAD CV RRR Lungs CTAB abd  Gravid soft and NT GU no vb EXt no calf tenderness Results for orders placed or performed during the hospital encounter of 07/16/15 (from the past 72 hour(s))  Urinalysis, Routine w reflex microscopic (not at [facility])     Status: Abnormal   Collection Time: 07/17/15 12:35 AM  Result Value Ref Range   Color, Urine YELLOW YELLOW   APPearance TURBID (A) CLEAR   Specific Gravity, Urine 1.020 1.005 - 1.030   pH 7.5 5.0 - 8.0   Glucose, UA NEGATIVE NEGATIVE mg/dL   Hgb urine dipstick NEGATIVE NEGATIVE   Bilirubin Urine NEGATIVE NEGATIVE   Ketones, ur >80 (A) NEGATIVE mg/dL   Protein, ur NEGATIVE NEGATIVE mg/dL   Nitrite POSITIVE (A) NEGATIVE   Leukocytes, UA MODERATE (A) NEGATIVE  Urine microscopic-add on     Status: Abnormal   Collection Time: 07/17/15 12:35 AM  Result Value Ref Range   Squamous Epithelial / LPF 0-5 (A) NONE SEEN    Comment: Please note change in reference range.   WBC, UA 6-30 0 - 5 WBC/hpf    Comment: Please note change in reference range.   RBC / HPF NONE SEEN 0 - 5 RBC/hpf    Comment: Please note change in reference range.   Bacteria, UA MANY (A) NONE SEEN    Comment: Please note change in reference range.   Urine-Other MUCOUS PRESENT     Comment: YEAST PRESENT  Urinalysis, Routine w reflex microscopic (not at [facility])     Status: Abnormal   Collection Time: 07/17/15 12:35 AM  Result Value Ref Range   Color, Urine YELLOW YELLOW   APPearance HAZY (A) CLEAR   Specific Gravity, Urine 1.020 1.005 - 1.030   pH 7.5 5.0 - 8.0   Glucose, UA NEGATIVE NEGATIVE mg/dL   Hgb urine dipstick NEGATIVE NEGATIVE   Bilirubin Urine NEGATIVE NEGATIVE   Ketones, ur >80 (A) NEGATIVE mg/dL   Protein, ur NEGATIVE NEGATIVE mg/dL   Nitrite POSITIVE (A) NEGATIVE   Leukocytes, UA TRACE (A) NEGATIVE  Urine microscopic-add on     Status: Abnormal   Collection Time: 07/17/15 12:35 AM  Result Value Ref Range   Squamous Epithelial / LPF 6-30 (A) NONE SEEN    Comment: Please note change in reference range.   WBC, UA TOO NUMEROUS TO COUNT 0 - 5 WBC/hpf    Comment: Please note change in reference range.   RBC / HPF NONE SEEN 0 - 5 RBC/hpf    Comment: Please note change in reference range.   Bacteria, UA MANY (A) NONE SEEN    Comment: Please note change in reference range.   Urine-Other MUCOUS PRESENT     Comment: YEAST PRESENT  Comprehensive metabolic panel     Status: Abnormal   Collection Time: 07/17/15  5:25 AM  Result Value Ref Range   Sodium 134 (L) 135 - 145 mmol/L   Potassium 3.7 3.5 - 5.1 mmol/L   Chloride 104 101 - 111 mmol/L   CO2 21 (L) 22 - 32 mmol/L   Glucose, Bld 96 65 - 99 mg/dL   BUN 8 6 -  20 mg/dL   Creatinine, Ser 0.58 0.44 - 1.00 mg/dL   Calcium 9.1 8.9 - 10.3 mg/dL   Total Protein 7.8 6.5 - 8.1 g/dL   Albumin 4.0 3.5 - 5.0 g/dL   AST 18 15 - 41 U/L   ALT 16 14 - 54 U/L   Alkaline Phosphatase 56 38 - 126 U/L   Total Bilirubin 0.6 0.3 - 1.2 mg/dL   GFR calc non Af Amer >60 >60 mL/min   GFR calc Af Amer >60 >60 mL/min    Comment: (NOTE) The eGFR has been calculated using the CKD EPI equation. This calculation has not been validated in all clinical situations. eGFR's persistently <60 mL/min signify possible Chronic Kidney Disease.    Anion gap 9 5 - 15  CBC WITH DIFFERENTIAL     Status: Abnormal   Collection Time: 07/17/15  5:25 AM  Result Value Ref Range   WBC 9.1 4.0 - 10.5 K/uL   RBC 4.20 3.87 - 5.11 MIL/uL   Hemoglobin 8.6 (L) 12.0 - 15.0 g/dL   HCT 27.5 (L) 36.0 - 46.0 %   MCV 65.5 (L) 78.0 - 100.0 fL   MCH 20.5 (L) 26.0 - 34.0 pg   MCHC 31.3 30.0 - 36.0 g/dL   RDW 21.1 (H) 11.5 - 15.5 %   Platelets 345 150 - 400 K/uL   Neutrophils Relative %  83 %   Neutro Abs 7.5 1.7 - 7.7 K/uL   Lymphocytes Relative 11 %   Lymphs Abs 1.0 0.7 - 4.0 K/uL   Monocytes Relative 5 %   Monocytes Absolute 0.5 0.1 - 1.0 K/uL   Eosinophils Relative 1 %   Eosinophils Absolute 0.1 0.0 - 0.7 K/uL   Basophils Relative 0 %   Basophils Absolute 0.0 0.0 - 0.1 K/uL    Assessment and Plan [redacted]w[redacted]d Pt still with n/v will continue supportive measures. If not improved by tomorrow will consider steroid taper Wound culture pending Continue abx for UTI

## 2015-07-19 LAB — CULTURE, OB URINE: Culture: >=100000

## 2015-07-19 LAB — CBC WITH DIFFERENTIAL/PLATELET
Basophils Absolute: 0 10*3/uL (ref 0.0–0.1)
Basophils Relative: 0 %
Eosinophils Absolute: 0 10*3/uL (ref 0.0–0.7)
Eosinophils Relative: 1 %
HCT: 27.3 % — ABNORMAL LOW (ref 36.0–46.0)
Hemoglobin: 8.5 g/dL — ABNORMAL LOW (ref 12.0–15.0)
Lymphocytes Relative: 23 %
Lymphs Abs: 1.7 10*3/uL (ref 0.7–4.0)
MCH: 20.5 pg — ABNORMAL LOW (ref 26.0–34.0)
MCHC: 31.1 g/dL (ref 30.0–36.0)
MCV: 65.9 fL — ABNORMAL LOW (ref 78.0–100.0)
Monocytes Absolute: 0.6 10*3/uL (ref 0.1–1.0)
Monocytes Relative: 8 %
Neutro Abs: 5.3 10*3/uL (ref 1.7–7.7)
Neutrophils Relative %: 69 %
Platelets: 308 10*3/uL (ref 150–400)
RBC: 4.14 MIL/uL (ref 3.87–5.11)
RDW: 20.4 % — ABNORMAL HIGH (ref 11.5–15.5)
WBC: 7.7 10*3/uL (ref 4.0–10.5)

## 2015-07-19 LAB — BASIC METABOLIC PANEL
Anion gap: 10 (ref 5–15)
BUN: 8 mg/dL (ref 6–20)
CO2: 22 mmol/L (ref 22–32)
Calcium: 8.9 mg/dL (ref 8.9–10.3)
Chloride: 102 mmol/L (ref 101–111)
Creatinine, Ser: 0.48 mg/dL (ref 0.44–1.00)
GFR calc Af Amer: >60 mL/min (ref >60)
GFR calc non Af Amer: >60 mL/min (ref >60)
Glucose, Bld: 75 mg/dL (ref 65–99)
Potassium: 3.3 mmol/L — ABNORMAL LOW (ref 3.5–5.1)
Sodium: 134 mmol/L — ABNORMAL LOW (ref 135–145)

## 2015-07-19 LAB — WOUND CULTURE: Gram Stain: NONE SEEN

## 2015-07-19 LAB — TSH: TSH: 0.303 u[IU]/mL — ABNORMAL LOW (ref 0.350–4.500)

## 2015-07-19 MED ORDER — CYCLOBENZAPRINE HCL 5 MG PO TABS
5.0000 mg | ORAL_TABLET | Freq: Three times a day (TID) | ORAL | Status: DC | PRN
  Administered 2015-07-19 – 2015-07-20 (×2): 5 mg via ORAL
  Filled 2015-07-19 (×3): qty 1

## 2015-07-19 MED ORDER — POTASSIUM CHLORIDE CRYS ER 20 MEQ PO TBCR
20.0000 meq | EXTENDED_RELEASE_TABLET | Freq: Two times a day (BID) | ORAL | Status: DC
  Filled 2015-07-19 (×3): qty 1

## 2015-07-19 MED ORDER — POTASSIUM CHLORIDE 20 MEQ/15ML (10%) PO SOLN
20.0000 meq | Freq: Two times a day (BID) | ORAL | Status: DC
  Administered 2015-07-19: 1261.3334 meq via ORAL
  Filled 2015-07-19 (×3): qty 15

## 2015-07-19 MED ORDER — POTASSIUM CHLORIDE 20 MEQ PO PACK
20.0000 meq | PACK | Freq: Two times a day (BID) | ORAL | Status: DC
  Filled 2015-07-19 (×3): qty 1

## 2015-07-19 MED ORDER — POTASSIUM CHLORIDE CRYS ER 20 MEQ PO TBCR
20.0000 meq | EXTENDED_RELEASE_TABLET | Freq: Two times a day (BID) | ORAL | Status: DC
  Administered 2015-07-19 – 2015-07-20 (×2): 20 meq via ORAL
  Filled 2015-07-19 (×5): qty 1

## 2015-07-19 NOTE — Clinical Social Work Note (Signed)
Clinical Social Work Assessment  Patient Details  Name: Melanie Hancock MRN: [id number] Date of Birth: 14-Mar-1996  Date of referral:  07/18/15               Reason for consult:  Housing Concerns/Homelessness                Permission sought to share information with:  Other-- care team Permission granted to share information::  Yes, Verbal Permission Granted  Name::        Agency::     Relationship::     Contact Information:     Housing/Transportation Living arrangements for the past 2 months:  Single Family Home: with FOB and other friends Source of Information:  Patient Patient Interpreter Needed:  None Criminal Activity/Legal Involvement Pertinent to Current Situation/Hospitalization:  Yes Significant Relationships:  Significant Other, Other Family Members Lives with:  Relatives Do you feel safe going back to the place where you live?  Yes Need for family participation in patient care:  No   Care giving concerns:  N/A  Office managerocial Worker assessment / plan:   CSW received request for consult from CNM due to concerns about housing instability.    CSW accompanied RN to patient's room. Patient was initially difficult to arouse and presented as minimally receptive to the visit; however, as visit continued and rapport was established, patient became more receptive and more engaged.  Affect and mood were appropriate and congruent to the setting and the situation. She displayed a full range in affect, and became appropriately tearful. CSW utilized techniques from CBT, DBT, and Solutions Focused therapy to complete the brief intervention/assessment.   After CSW introduced self and role of CSW in the care team, CSW began to establish rapport.  During time time, patient stated that she has two other children (ages 35 and 7 months).  Patient stated that she has not seen her 277 month old since June, and reported that she does not have contact with her 21 year old either.  Per patient, they are  currently live with their fathers (different fathers), and she discussed at length the events that led to her loss of custody for each child.  Patient stated that her oldest child lives in [place], and she has court on Thursday (12/1) to further discuss custody. She shared that she has full custody of this child, and hopes to be able to regain visitation/custody since his father is not following the custody agreement; however, she reported that it may be difficult since the father of this child has a 50b out against her after pressing charges due to "simple assault".  Patient stated that she has never abused him, and she believes that the charges are not accurate/representative of her previous behaviors.  Patient expressed motivation and intention to attend court on 12/1, and stated that she hopes she is feeling better in order to go to court.  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 She stated that the father of that child claimed that she was "unfit", and reported that "he took him from me".  Patient shared that she has not yet sought out legal assistance to regain custody of him, and became tearful and emotional as she reflected upon what she has missed out on during his brief/short life.  Patient originally was not interested in discussing potential options to assist her with visitations/custody, but later stated that she was wanting to explore  potential resources.   Patient endorsed presence of housing instability. She stated that prior to the admission, she was living with the FOB and some friends. Per patient, she was informed during the admission that she is unable to return to this current living arrangement; however, she reported that she has plans to stay with her grandmother. She stated that she and the FOB intend to look for their own housing in upcoming months, but she reported that she will need to secure  employment in order to be approved for housing. Patient reported that she has already explored potential employment options, and feels optimistic about her job prospects.   Patient shared that she was initially overwhelmed when she first learned that she was pregnant again due to short interval between pregnancies; however, she shared belief that "God did this for a reason". She stated that she has slowly become excited, and is looking forward to learning the gender of the baby and then having a gender reveal party. She stated that the relationship with the FOB is "new" as they had only been dating for 1-2 weeks prior to conceiving.  Patient reported that she feels well supported by the FOB, and discussed how he has been present and supportive even when she has been sick.    Per patient, she presents with a history of anxiety. She was unable to recall onset of anxiety, but stated that it occurred between her two pregnancies. Per patient, she noted increase in depressive symptoms during her previous pregnancy, and shared belief that it was closely related to feeling unsupportive by that baby's father. She shared that she was still able to go to work, but felt more emotional, tearful, and irritable.  Per patient, once the infant was born, she noted increase in symptoms. She expressed regret for her maladaptive coping skills since she reported that she was drinking, which led to the father of that baby to restrict contact from her.  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 Patient also reported that she engaged in self-injurious behaviors by cutting her arm in an effort to "releave" pain this past summer.  Patient became tearful as she discussed these events, and reported that one morning she decided it was time to "change". She stated that she was not proud of her behaviors  and the decisions she made, and shared that she felt a strong need to stop all substance use. Patient discussed how she noted an improvement in mood, and began to utilize her kids as a source of motivation to change and maintain sobriety.  Patient stated that it was approximately 2 months later, that she learned that she was pregnant.   Patient stated that during this pregnancy, she believes her depression is "better". She shared that she notes that she feels more depressed when she does not leave the home since she ruminates on her psychosocial stressors. Patient shared that when she talks to her family members, goes shopping, etc., she notes improvement of mood since she is "distracted".  Patient denied interest in a referral for therapy at this time, but acknowledged need to closely monitor her mood due to risk for perinatal mood disorders.   Patient acknowledged importance of notifying her medical providers if she notes any changes in her mood in order to initiate appropriate treatment during the pregnancy.  Patient denied any feelings of  self-harm during the pregnancy. She shared that she continues to feel emotional and overwhelmed at times, but reported again that it is "better" in comparison to the past.  CSW continued to explore with patient potential strategies to assist with mood improvement. Patient acknowledged need to reduce isolation and increase interaction with others. She acknowledged the importance of sleep, self-care, and talking with others about how she is feeling.     Employment status:  Unemployed-- currently seeking employment Insurance information:  [place]icaid [place] PT Recommendations:  Not assessed at this time Information / Referral to community resources:  [employer] Healthy Moms Healthy Babies  Patient/Family's Response to care:   Patient expressed appreciation for the care and support that she has received at the hospital. She  shared impressions that the nursing staff is assisting her to feel better and increase her mobility.   Patient/Family's Understanding of and Emotional Response to Diagnosis, Current Treatment, and Prognosis:   Patient is hopeful that she will be able to be discharged home as she continues to feel better. She stated that she is anticipating a "sicker" pregnancy, but shared that she feels excited about the pregnancy, and is prepared to cope with any hyperemesis that may continue.   Emotional Assessment Appearance:  Appears stated age, Disheveled Attitude/Demeanor/Rapport:  Crying, Other Affect (typically observed):  Tearful/Crying, Pleasant, Calm Orientation:  Oriented to Self, Oriented to Place, Oriented to  Time, Oriented to Situation Alcohol / Substance use:  Illicit Drugs: History of "molly". She stated that she stopped all use of substances 2-3 months prior to learning that she was pregnant.  Psych involvement (Current and /or in the community):  None; however, she endorsed history of anxiety and presence of depressive symptoms postpartum and during this pregnancy.  Discharge Needs  Concerns to be addressed:  Legal Concerns, Coping/Stress Concerns, Mental Health Concerns, Discharge Planning Concerns Readmission within the last 30 days:  No Current discharge risk:  None Barriers to Discharge:  No Barriers Identified   Pervis Hocking, LCSW 07/19/2015, 1:22 PM

## 2015-07-19 NOTE — Progress Notes (Signed)
Patient called for help in room. Pt tearful, states that she needs to vomit, but can't. Encouraged patient to drink sips of fluids and eat small bites of her applesauce or crackers. Pt has only taken a few sips of water since 1900. Pt has been resting well.

## 2015-07-19 NOTE — Progress Notes (Addendum)
Hospital day # 2 pregnancy at 5374w3d--admitted with n/v and UTI.  Pt reports social issue with both FOB of her other 2 children.  She has a court date for the older child.  The FOB is trying to get full custody.  The FOB of the infant second child took him away from her siting she was unfit.  Court action is pending for that child.  She did not give information on the FOB of this child.   S:  Perception of contractions: none      Vaginal bleeding: none now       Vaginal discharge:  no significant change  O: BP 132/71 mmHg  Pulse 57  Temp(Src) 98.6 F (37 C) (Oral)  Resp 16  Ht 5\' 6"  (1.676 m)  Wt 154 lb 1.3 oz (69.89 kg)  BMI 24.88 kg/m2  SpO2 100%  LMP 04/23/2015  Breastfeeding? No      Fetal tracings:161      Contractions:  None per pt       Uterus gravid and non-tender      Extremities: no significant edema and no signs of DVT          Labs:   Results for orders placed or performed during the hospital encounter of 07/16/15 (from the past 24 hour(s))  CBC with Differential/Platelet     Status: Abnormal   Collection Time: 07/19/15  5:30 AM  Result Value Ref Range   WBC 7.7 4.0 - 10.5 K/uL   RBC 4.14 3.87 - 5.11 MIL/uL   Hemoglobin 8.5 (L) 12.0 - 15.0 g/dL   HCT 16.127.3 (L) 09.636.0 - 04.546.0 %   MCV 65.9 (L) 78.0 - 100.0 fL   MCH 20.5 (L) 26.0 - 34.0 pg   MCHC 31.1 30.0 - 36.0 g/dL   RDW 40.920.4 (H) 81.111.5 - 91.415.5 %   Platelets 308 150 - 400 K/uL   Neutrophils Relative % 69 %   Neutro Abs 5.3 1.7 - 7.7 K/uL   Lymphocytes Relative 23 %   Lymphs Abs 1.7 0.7 - 4.0 K/uL   Monocytes Relative 8 %   Monocytes Absolute 0.6 0.1 - 1.0 K/uL   Eosinophils Relative 1 %   Eosinophils Absolute 0.0 0.0 - 0.7 K/uL   Basophils Relative 0 %   Basophils Absolute 0.0 0.0 - 0.1 K/uL  Basic metabolic panel     Status: Abnormal   Collection Time: 07/19/15  5:30 AM  Result Value Ref Range   Sodium 134 (L) 135 - 145 mmol/L   Potassium 3.3 (L) 3.5 - 5.1 mmol/L   Chloride 102 101 - 111 mmol/L   CO2 22 22 -  32 mmol/L   Glucose, Bld 75 65 - 99 mg/dL   BUN 8 6 - 20 mg/dL   Creatinine, Ser 7.820.48 0.44 - 1.00 mg/dL   Calcium 8.9 8.9 - 95.610.3 mg/dL   GFR calc non Af Amer >60 >60 mL/min   GFR calc Af Amer >60 >60 mL/min   Anion gap 10 5 - 15  TSH     Status: Abnormal   Collection Time: 07/19/15  5:30 AM  Result Value Ref Range   TSH 0.303 (L) 0.350 - 4.500 uIU/mL         Meds:  Current facility-administered medications:  .  ceFAZolin (ANCEF) IVPB 2 g/50 mL premix, 2 g, Intravenous, Q8H, Nigel BridgemanVicki Latham, CNM, 2 g at 07/19/15 0940 .  [COMPLETED] lactated ringers bolus 500 mL, 500 mL, Intravenous, Once, 500 mL at 07/17/15  0015 **FOLLOWED BY** lactated ringers infusion, , Intravenous, Continuous, Nigel Bridgeman, CNM, Last Rate: 250 mL/hr at 07/17/15 0100 .  lactated ringers infusion, , Intravenous, Continuous, Venus Standard, CNM, Last Rate: 125 mL/hr at 07/19/15 1210 .  ondansetron (ZOFRAN) injection 4 mg, 4 mg, Intravenous, 4 times per day, Nigel Bridgeman, CNM, 4 mg at 07/19/15 1257 .  pantoprazole (PROTONIX) injection 40 mg, 40 mg, Intravenous, Q24H, Nigel Bridgeman, CNM, 40 mg at 07/18/15 2302 .  prenatal multivitamin tablet 1 tablet, 1 tablet, Oral, Q1200, Nigel Bridgeman, CNM, 1 tablet at 07/18/15 1203 .  promethazine (PHENERGAN) injection 12.5 mg, 12.5 mg, Intravenous, Q6H PRN, Silverio Lay, MD, 12.5 mg at 07/19/15 0241 .  sodium chloride 0.9 % injection 3 mL, 3 mL, Intravenous, PRN, Silverio Lay, MD .  zolpidem (AMBIEN) tablet 5 mg, 5 mg, Oral, QHS PRN, Nigel Bridgeman, CNM  A: [redacted]w[redacted]d with hyperemesis and UTI      stable     Fetal tracings: wnl     Contractions: none     Uterus non-tender      Extremities: DTR 1+, no clonus, none edema     Ancef  P: Continue current plan of care      Upcoming tests/treatments:  CW consult      Consult with Dr. Sallye Ober  for plan on care      MDs will follow   Venus Standard, CNM, MSN 07/19/2015. 1:37 PM  I saw and examined patient at bedside and agree with above  findings, assessment and plan.   Patient reports feeling better with less nausea and vomiting, she will try eating apple sauce and crackers.  Will give potassium chloride, check BMP, free t3, free t4 in AM if still admitted, otherwise follow as outpatient.  Flexeril for muscle spasms.   BMET    Component Value Date/Time   NA 134* 07/19/2015 0530   K 3.3* 07/19/2015 0530   CL 102 07/19/2015 0530   CO2 22 07/19/2015 0530   GLUCOSE 75 07/19/2015 0530   BUN 8 07/19/2015 0530   CREATININE 0.48 07/19/2015 0530   CALCIUM 8.9 07/19/2015 0530   GFRNONAA >60 07/19/2015 0530   GFRAA >60 07/19/2015 0530   Dr. Sallye Ober.

## 2015-07-20 LAB — CBC
HCT: 29.4 % — ABNORMAL LOW (ref 36.0–46.0)
Hemoglobin: 9.2 g/dL — ABNORMAL LOW (ref 12.0–15.0)
MCH: 20.8 pg — ABNORMAL LOW (ref 26.0–34.0)
MCHC: 31.3 g/dL (ref 30.0–36.0)
MCV: 66.4 fL — ABNORMAL LOW (ref 78.0–100.0)
Platelets: 329 10*3/uL (ref 150–400)
RBC: 4.43 MIL/uL (ref 3.87–5.11)
RDW: 20 % — ABNORMAL HIGH (ref 11.5–15.5)
WBC: 6.3 10*3/uL (ref 4.0–10.5)

## 2015-07-20 LAB — BASIC METABOLIC PANEL
Anion gap: 9 (ref 5–15)
BUN: 7 mg/dL (ref 6–20)
CO2: 23 mmol/L (ref 22–32)
Calcium: 8.8 mg/dL — ABNORMAL LOW (ref 8.9–10.3)
Chloride: 100 mmol/L — ABNORMAL LOW (ref 101–111)
Creatinine, Ser: 0.58 mg/dL (ref 0.44–1.00)
GFR calc Af Amer: >60 mL/min (ref >60)
GFR calc non Af Amer: >60 mL/min (ref >60)
Glucose, Bld: 88 mg/dL (ref 65–99)
Potassium: 2.9 mmol/L — ABNORMAL LOW (ref 3.5–5.1)
Sodium: 132 mmol/L — ABNORMAL LOW (ref 135–145)

## 2015-07-20 MED ORDER — CLINDAMYCIN HCL 300 MG PO CAPS: 300.0000 mg | ORAL_CAPSULE | Freq: Three times a day (TID) | ORAL | Status: AC

## 2015-07-20 MED ORDER — POTASSIUM CHLORIDE ER 20 MEQ PO TBCR: 20.0000 meq | EXTENDED_RELEASE_TABLET | Freq: Two times a day (BID) | ORAL | Status: DC

## 2015-07-20 MED ORDER — DOCUSATE SODIUM 100 MG PO CAPS: 100.0000 mg | ORAL_CAPSULE | Freq: Two times a day (BID) | ORAL | Status: DC

## 2015-07-20 MED ORDER — PANTOPRAZOLE SODIUM 20 MG PO TBEC: 20.0000 mg | DELAYED_RELEASE_TABLET | Freq: Every day | ORAL | Status: DC | PRN

## 2015-07-20 MED ORDER — FERROUS SULFATE 325 (65 FE) MG PO TABS
325.0000 mg | ORAL_TABLET | Freq: Three times a day (TID) | ORAL | Status: DC
  Administered 2015-07-20 (×2): 325 mg via ORAL
  Filled 2015-07-20 (×2): qty 1

## 2015-07-20 MED ORDER — PROMETHAZINE HCL 12.5 MG PO TABS: 12.5000 mg | ORAL_TABLET | Freq: Four times a day (QID) | ORAL | Status: DC | PRN

## 2015-07-20 MED ORDER — ONDANSETRON 8 MG PO TBDP: 8.0000 mg | ORAL_TABLET | Freq: Two times a day (BID) | ORAL | Status: DC | PRN

## 2015-07-20 NOTE — Progress Notes (Signed)
Encouraged patient to call and order food.  Patient ordering food.

## 2015-07-20 NOTE — Discharge Summary (Addendum)
Physician Discharge Summary  Patient ID: Melanie Hancock L Wale MRN: [id number] DOB/AGE: [birth year] 21 y.o.  Admit date: 07/16/2015 Discharge date: 07/20/2015  Admission Diagnoses: Hypermemesis  Discharge Diagnoses:  Active Problems:   Hyperemesis Boil  Discharged Condition: good  Hospital Course: Patient was admitted with hyperemesis and received IVF and antiemetics.  She is doing better today and tolerating po.  Will discharge home.  Pt has several psychosocial issues and was seen by SW.  She goes to court tomorrow regarding custody of her child with FOB.  Pt instructed to keep appointment in office and call with worsening sxs.  She incidentally had low potassium so a BMET should be done at her next visit in the office.  She also had a UTI treated in hospital with ancef and a boil that grew out staph sensitive to clindamycin which she will go home on.  Consults: SW  Significant Diagnostic Studies: as above  Treatments: as above  Discharge Exam: Blood pressure 103/48, pulse 62, temperature 98.4 F (36.9 C), temperature source Oral, resp. rate 16, height 5\' 6"  (1.676 m), weight 69.4 kg (153 lb), last menstrual period 04/23/2015, SpO2 99 %, not currently breastfeeding. General appearance: alert and no distress Resp: clear to auscultation bilaterally Cardio: regular rate and rhythm Extremities: extremities normal, atraumatic, no cyanosis or edema  Disposition: 01-Home or Self Care     Medication List    TAKE these medications        clindamycin 300 MG capsule  Commonly known as:  CLEOCIN  Take 1 capsule (300 mg total) by mouth every 8 (eight) hours.     famotidine 20 MG tablet  Commonly known as:  PEPCID  Take 1 tablet (20 mg total) by mouth 2 (two) times daily.     ondansetron 8 MG disintegrating tablet  Commonly known as:  ZOFRAN ODT  Take 1 tablet (8 mg total) by mouth every 12 (twelve) hours as needed for nausea or vomiting.     pantoprazole 20 MG tablet  Commonly  known as:  PROTONIX  Take 1 tablet (20 mg total) by mouth daily as needed.     prenatal multivitamin Tabs tablet  Take 1 tablet by mouth daily at 12 noon.     promethazine 25 MG suppository  Commonly known as:  PHENERGAN  Place 1 suppository (25 mg total) rectally every 6 (six) hours as needed for nausea or vomiting.     promethazine 12.5 MG tablet  Commonly known as:  PHENERGAN  Take 1 tablet (12.5 mg total) by mouth every 6 (six) hours as needed for nausea or vomiting. K-DUR 20meq Take 1 tablet 2 times a day for 7 days then daily until visit in the office           Follow-up Information    [facility] On 07/29/2015.   Specialty:  Obstetrics and Gynecology   Why:  Keep scheduled appointment in office   Contact information:   [address] [place] [phone]      Signed: Purcell NailsROBERTS,Royer Cristobal Y 07/20/2015, 4:55 PM

## 2015-07-20 NOTE — Progress Notes (Signed)
Patient tolerated apple sauce, iron and prenatal vitamin.  Patient states "  I feel much better."

## 2015-07-20 NOTE — Progress Notes (Signed)

## 2015-07-20 NOTE — Progress Notes (Signed)
Patient denied AM blood work. Gerrit HeckJessica Emly, CNM, on-call made aware.

## 2015-07-20 NOTE — Progress Notes (Signed)
Patient tolerated grilled chicken and rice for lunch without nausea or vomiting.

## 2015-07-20 NOTE — Progress Notes (Addendum)
Hospital day # 3 pregnancy at [redacted]w[redacted]d-admitted with n/v and UTI. Pt reports social issue with both FOB of her other 2 children. She has a court date for the older child. The FOB is trying to get full custody. The FOB of the infant second child took him away from her siting she was unfit. Court action is pending for that child..   S:   Pt sleeping, easily aroused      Perception of contractions: none      Vaginal bleeding: none now       Vaginal discharge:  no significant change       O: BP 121/67 mmHg  Pulse 64  Temp(Src) 98.9 F (37.2 C) (Oral)  Resp 16  Ht _0  (1.676 m)  Wt 69.4 kg (153 lb)  BMI 24.71 kg/m2  SpO2 99%  LMP 04/23/2015  Breastfeeding? No      Fetal tracings:  160bpm      Contractions:   None per pt      Uterus gravid and non-tender      Extremities: extremities normal, atraumatic, no cyanosis or edema and no significant edema and no signs of DVT     Social work Consult: Discharge Needs  Concerns to be addressed: Legal Concerns, Coping/Stress Concerns, Mental Health Concerns, Discharge Planning Concerns Readmission within the last 30 days: No Current discharge risk: None Barriers to Discharge: No Barriers Identified          Labs:  Results for orders placed or performed during the hospital encounter of 07/16/15 (from the past 48 hour(s))  CBC with Differential/Platelet     Status: Abnormal   Collection Time: 07/19/15  5:30 AM  Result Value Ref Range   WBC 7.7 4.0 - 10.5 K/uL   RBC 4.14 3.87 - 5.11 MIL/uL   Hemoglobin 8.5 (L) 12.0 - 15.0 g/dL   HCT 27.3 (L) 36.0 - 46.0 %   MCV 65.9 (L) 78.0 - 100.0 fL   MCH 20.5 (L) 26.0 - 34.0 pg   MCHC 31.1 30.0 - 36.0 g/dL   RDW 20.4 (H) 11.5 - 15.5 %   Platelets 308 150 - 400 K/uL   Neutrophils Relative % 69 %   Neutro Abs 5.3 1.7 - 7.7 K/uL   Lymphocytes Relative 23 %   Lymphs Abs 1.7 0.7 - 4.0 K/uL   Monocytes Relative 8 %   Monocytes Absolute 0.6 0.1 - 1.0 K/uL   Eosinophils Relative 1 %   Eosinophils Absolute 0.0 0.0 - 0.7 K/uL   Basophils Relative 0 %   Basophils Absolute 0.0 0.0 - 0.1 K/uL  Basic metabolic panel     Status: Abnormal   Collection Time: 07/19/15  5:30 AM  Result Value Ref Range   Sodium 134 (L) 135 - 145 mmol/L   Potassium 3.3 (L) 3.5 - 5.1 mmol/L   Chloride 102 101 - 111 mmol/L   CO2 22 22 - 32 mmol/L   Glucose, Bld 75 65 - 99 mg/dL   BUN 8 6 - 20 mg/dL   Creatinine, Ser 0.48 0.44 - 1.00 mg/dL   Calcium 8.9 8.9 - 10.3 mg/dL   GFR calc non Af Amer >60 >60 mL/min   GFR calc Af Amer >60 >60 mL/min    Comment: (NOTE) The eGFR has been calculated using the CKD EPI equation. This calculation has not been validated in all clinical situations. eGFR's persistently <60 mL/min signify possible Chronic Kidney Disease.    Anion gap 10 5 - 15  TSH     Status: Abnormal   Collection Time: 07/19/15  5:30 AM  Result Value Ref Range   TSH 0.303 (L) 0.350 - 4.500 uIU/mL    Comment: Performed at [facility]:   Current facility-administered medications:  .  ceFAZolin (ANCEF) IVPB 2 g/50 mL premix, 2 g, Intravenous, Q8H, Donnel Saxon, CNM, 2 g at 07/20/15 1012 .  cyclobenzaprine (FLEXERIL) tablet 5 mg, 5 mg, Oral, TID PRN, Waymon Amato, MD, 5 mg at 07/20/15 0022 .  [START ON 07/21/2015] docusate sodium (COLACE) capsule 100 mg, 100 mg, Oral, BID, Gavin Pound, CNM .  ferrous sulfate tablet 325 mg, 325 mg, Oral, TID WC, Gavin Pound, CNM, 325 mg at 07/20/15 0815 .  [COMPLETED] lactated ringers bolus 500 mL, 500 mL, Intravenous, Once, 500 mL at 07/17/15 0015 **FOLLOWED BY** lactated ringers infusion, , Intravenous, Continuous, Donnel Saxon, CNM, Last Rate: 250 mL/hr at 07/17/15 0100 .  lactated ringers infusion, , Intravenous, Continuous, Venus Standard, CNM, Last Rate: 125 mL/hr at 07/20/15 0700 .  ondansetron (ZOFRAN) injection 4 mg, 4 mg, Intravenous, 4 times per day, Donnel Saxon, CNM, 4 mg at 07/20/15 0608 .  pantoprazole (PROTONIX) injection 40  mg, 40 mg, Intravenous, Q24H, Donnel Saxon, CNM, 40 mg at 07/19/15 2329 .  potassium chloride SA (K-DUR,KLOR-CON) CR tablet 20 mEq, 20 mEq, Oral, BID, Waymon Amato, MD, 20 mEq at 07/20/15 1012 .  prenatal multivitamin tablet 1 tablet, 1 tablet, Oral, Q1200, Donnel Saxon, CNM, 1 tablet at 07/18/15 1203 .  promethazine (PHENERGAN) injection 12.5 mg, 12.5 mg, Intravenous, Q6H PRN, Delsa Bern, MD, 12.5 mg at 07/20/15 0027 .  sodium chloride 0.9 % injection 3 mL, 3 mL, Intravenous, PRN, Delsa Bern, MD .  zolpidem (AMBIEN) tablet 5 mg, 5 mg, Oral, QHS PRN, Donnel Saxon, CNM  A: 22w4dwith hyperemesis and UTI     Stable      On ANCEF       Anemia, Hcg 8.5        Hypokalemia, 3.3        Social work consult completed    P: Continue current plan of care      Upcoming tests/treatments:       Pt to eat breakgfast        Repeat Labs ( BMP, CBC, FreeT3, FreeT4)         May discharge today, if able to tolerate PO/ breakfast         Will consult Dr. RMancel Bale        MDs will follow  RFulton MN 07/20/2015 10:52 AM

## 2015-07-21 LAB — T3, FREE: T3, Free: 1.8 pg/mL — ABNORMAL LOW (ref 2.0–4.4)

## 2015-07-21 LAB — T4, FREE: Free T4: 0.79 ng/dL (ref 0.61–1.12)

## 2015-07-29 ENCOUNTER — Telehealth: Payer: Self-pay | Admitting: Certified Nurse Midwife

## 2015-07-29 ENCOUNTER — Inpatient Hospital Stay (HOSPITAL_COMMUNITY)
Admission: AD | Admit: 2015-07-29 | Discharge: 2015-07-29 | Disposition: A | Discharge status: Home / Self Care | Payer: Medicaid Other | Source: Ambulatory Visit | Attending: Obstetrics and Gynecology | Admitting: Obstetrics and Gynecology

## 2015-07-29 ENCOUNTER — Encounter (HOSPITAL_COMMUNITY): Payer: Self-pay | Admitting: *Deleted

## 2015-07-29 DIAGNOSIS — Z3A12 12 weeks gestation of pregnancy: Secondary | ICD-10-CM | POA: Diagnosis not present

## 2015-07-29 DIAGNOSIS — O2341 Unspecified infection of urinary tract in pregnancy, first trimester: Secondary | ICD-10-CM | POA: Insufficient documentation

## 2015-07-29 DIAGNOSIS — Z202 Contact with and (suspected) exposure to infections with a predominantly sexual mode of transmission: Secondary | ICD-10-CM | POA: Diagnosis present

## 2015-07-29 DIAGNOSIS — Z87891 Personal history of nicotine dependence: Secondary | ICD-10-CM | POA: Diagnosis not present

## 2015-07-29 DIAGNOSIS — O26891 Other specified pregnancy related conditions, first trimester: Secondary | ICD-10-CM | POA: Diagnosis present

## 2015-07-29 LAB — URINALYSIS, ROUTINE W REFLEX MICROSCOPIC
Bilirubin Urine: NEGATIVE
Glucose, UA: NEGATIVE mg/dL
Ketones, ur: NEGATIVE mg/dL
Nitrite: NEGATIVE
Protein, ur: NEGATIVE mg/dL
Specific Gravity, Urine: 1.01 (ref 1.005–1.030)
pH: 7 (ref 5.0–8.0)

## 2015-07-29 LAB — URINE MICROSCOPIC-ADD ON
Bacteria, UA: NONE SEEN
RBC / HPF: NONE SEEN RBC/hpf (ref 0–5)

## 2015-07-29 MED ORDER — CEFTRIAXONE SODIUM 250 MG IJ SOLR
250.0000 mg | Freq: Once | INTRAMUSCULAR | Status: AC
Start: 2015-07-29 — End: 2015-07-29
  Administered 2015-07-29: 250 mg via INTRAMUSCULAR
  Filled 2015-07-29: qty 250

## 2015-07-29 MED ORDER — CYCLOBENZAPRINE HCL 10 MG PO TABS: 10.0000 mg | ORAL_TABLET | Freq: Three times a day (TID) | ORAL | Status: DC | PRN

## 2015-07-29 MED ORDER — NITROFURANTOIN MONOHYD MACRO 100 MG PO CAPS: 100.0000 mg | ORAL_CAPSULE | Freq: Two times a day (BID) | ORAL | Status: DC

## 2015-07-29 NOTE — MAU Note (Signed)
Onset of dysuria for two days, denies vaginal discharge however has been told that her partner may have an STD

## 2015-07-29 NOTE — MAU Provider Note (Signed)
History   19 yo [id number] at 66 6/7 weeks presented unannounced c/o UTI sx and suspected exposure to [facility] from partner.  Denies d/c, bleeding, N/V, fever, back pain or any other sx.  Received information that her current partner was likely source of another individual's GC.  Patient hospitalized 11/26- 11/30 for hyperemesis.  Feeling much better, with no N/V since d/c.  Also had boil on left side of mons that grew out staph, sensitive to Clindamycin.  She was d/c'd home on 7 days of Clindamycin, Pepcid, Zofran, Protonix, Kdur, Phenergan.  She reports she was to have had an Rx for Flexeril, but that was not transmitted to pharmacy.  GC/chlamydia negative 06/14/15. Treated for UTI sx 07/17/15 with ancef--Ecoli on culture  She has NOB w/u scheduled 08/04/15.  Patient Active Problem List   Diagnosis Date Noted  . Hyperemesis 07/17/2015  . Anxiety 03/28/2014  . Chronic back pain 03/28/2014  . Anemia--Hgb 8.8 on 06/14/15 05/26/2013  . Hx pyelonephritis 2014 05/26/2013    Chief Complaint  Patient presents with  . Dysuria   HPI:  As above  OB History    Gravida Para Term Preterm AB TAB SAB Ectopic Multiple Living   0 2      Past Medical History  Diagnosis Date  . Infection     UTI  . UTI (lower urinary tract infection)   . Anxiety   . Anemia   . Pyelonephritis   . Chronic back pain     Past Surgical History  Procedure Laterality Date  . Induced abortion    . Dilation and curettage of uterus      Family History  Problem Relation Age of Onset  . Alcohol abuse Neg Hx     Social History  Substance Use Topics  . Smoking status: Former Games developer  . Smokeless tobacco: Never Used  . Alcohol Use: No    Allergies: No Known Allergies  Prescriptions prior to admission  Medication Sig Dispense Refill Last Dose  . famotidine (PEPCID) 20 MG tablet Take 1 tablet (20 mg total) by mouth 2 (two) times daily. (Patient not taking: Reported on 07/17/2015) 20 tablet 0 Not  Taking at Unknown time  . ondansetron (ZOFRAN ODT) 8 MG disintegrating tablet Take 1 tablet (8 mg total) by mouth every 12 (twelve) hours as needed for nausea or vomiting. 30 tablet 3   . pantoprazole (PROTONIX) 20 MG tablet Take 1 tablet (20 mg total) by mouth daily as needed. 30 tablet 3   . potassium chloride 20 MEQ TBCR Take 20 mEq by mouth 2 (two) times daily. For 7 days and then take daily until visit in the office. 20 tablet 0   . Prenatal Vit-Fe Fumarate-FA (PRENATAL MULTIVITAMIN) TABS tablet Take 1 tablet by mouth daily at 12 noon.   07/17/2015 at Unknown time  . promethazine (PHENERGAN) 12.5 MG tablet Take 1 tablet (12.5 mg total) by mouth every 6 (six) hours as needed for nausea or vomiting. 30 tablet 3   . promethazine (PHENERGAN) 25 MG suppository Place 1 suppository (25 mg total) rectally every 6 (six) hours as needed for nausea or vomiting. (Patient not taking: Reported on 07/17/2015) 12 each 0 Not Taking at Unknown time    ROS:  Dysuria, ? GC exposure Physical Exam   Blood pressure 123/72, pulse 84, temperature 98.2 F (36.8 C), temperature source Oral, resp. rate 16, last menstrual period 04/23/2015, not currently breastfeeding.   Physical Exam  In NAD Chest clear Heart RRR without murmur Abd gravid, NT, fundal height 13 week size. Pelvic--no d/c Ext WNL  FHR 158  Results for orders placed or performed during the hospital encounter of 07/29/15 (from the past 24 hour(s))  Urinalysis, Routine w reflex microscopic ([facility])     Status: Abnormal   Collection Time: 07/29/15  5:00 PM  Result Value Ref Range   Color, Urine STRAW (A) YELLOW   APPearance CLEAR CLEAR   Specific Gravity, Urine 1.010 1.005 - 1.030   pH 7.0 5.0 - 8.0   Glucose, UA NEGATIVE NEGATIVE mg/dL   Hgb urine dipstick TRACE (A) NEGATIVE   Bilirubin Urine NEGATIVE NEGATIVE   Ketones, ur NEGATIVE NEGATIVE mg/dL   Protein, ur NEGATIVE NEGATIVE mg/dL   Nitrite NEGATIVE NEGATIVE   Leukocytes, UA  MODERATE (A) NEGATIVE  Urine microscopic-add on     Status: Abnormal   Collection Time: 07/29/15  5:00 PM  Result Value Ref Range   Squamous Epithelial / LPF 0-5 (A) NONE SEEN   WBC, UA 0-5 0 - 5 WBC/hpf   RBC / HPF NONE SEEN 0 - 5 RBC/hpf   Bacteria, UA NONE SEEN NONE SEEN    ED Course  Assessment: IUP at 12 6/7 weeks UTI sx Possible GC exposure.  Plan: Reviewed issues and options for awaiting results of STD testing prior to treatment, or presumptively treating for GC.   Patient desires presumptive treatment.  Declines blood work for STDs. Urine from [facility] sent for culture. Dirty urine ordered for STD testing. Rocephin 250 mg IM in MAU. Plan f/u on results of STD testing. Advised patient no IC with current partner until TOC in 3 weeks. Keep scheduled NOB visit at [facility] on 08/04/15. Rx Macrobid BID x 7 days.   Nigel BridgemanLATHAM, Merna Baldi CNM, MSN 07/29/2015 5:07 PM

## 2015-07-29 NOTE — Discharge Instructions (Signed)
Pregnancy and Urinary Tract Infection °A urinary tract infection (UTI) is a bacterial infection of the urinary tract. Infection of the urinary tract can include the ureters, kidneys (pyelonephritis), bladder (cystitis), and urethra (urethritis). All pregnant women should be screened for bacteria in the urinary tract. Identifying and treating a UTI will decrease the risk of preterm labor and developing more serious infections in both the mother and baby. °CAUSES °Bacteria germs cause almost all UTIs.  °RISK FACTORS °Many factors can increase your chances of getting a UTI during pregnancy. These include: °· Having a short urethra. °· Poor toilet and hygiene habits. °· Sexual intercourse. °· Blockage of urine along the urinary tract. °· Problems with the pelvic muscles or nerves. °· Diabetes. °· Obesity. °· Bladder problems after having several children. °· Previous history of UTI. °SIGNS AND SYMPTOMS  °· Pain, burning, or a stinging feeling when urinating. °· Suddenly feeling the need to urinate right away (urgency). °· Loss of bladder control (urinary incontinence). °· Frequent urination, more than is common with pregnancy. °· Lower abdominal or back discomfort. °· Cloudy urine. °· Blood in the urine (hematuria). °· Fever.  °When the kidneys are infected, the symptoms may be: °· Back pain. °· Flank pain on the right side more so than the left. °· Fever. °· Chills. °· Nausea. °· Vomiting. °DIAGNOSIS  °A urinary tract infection is usually diagnosed through urine tests. Additional tests and procedures are sometimes done. These may include: °· Ultrasound exam of the kidneys, ureters, bladder, and urethra. °· Looking in the bladder with a lighted tube (cystoscopy). °TREATMENT °Typically, UTIs can be treated with antibiotic medicines.  °HOME CARE INSTRUCTIONS  °· Only take over-the-counter or prescription medicines as directed by your health care provider. If you were prescribed antibiotics, take them as directed. Finish  them even if you start to feel better. °· Drink enough fluids to keep your urine clear or pale yellow. °· Do not have sexual intercourse until the infection is gone and your health care provider says it is okay. °· Make sure you are tested for UTIs throughout your pregnancy. These infections often come back.  °Preventing a UTI in the Future °· Practice good toilet habits. Always wipe from front to back. Use the tissue only once. °· Do not hold your urine. Empty your bladder as soon as possible when the urge comes. °· Do not douche or use deodorant sprays. °· Wash with soap and warm water around the genital area and the anus. °· Empty your bladder before and after sexual intercourse. °· Wear underwear with a cotton crotch. °· Avoid caffeine and carbonated drinks. They can irritate the bladder. °· Drink cranberry juice or take cranberry pills. This may decrease the risk of getting a UTI. °· Do not drink alcohol. °· Keep all your appointments and tests as scheduled.  °SEEK MEDICAL CARE IF:  °· Your symptoms get worse. °· You are still having fevers 2 or more days after treatment begins. °· You have a rash. °· You feel that you are having problems with medicines prescribed. °· You have abnormal vaginal discharge. °SEEK IMMEDIATE MEDICAL CARE IF:  °· You have back or flank pain. °· You have chills. °· You have blood in your urine. °· You have nausea and vomiting. °· You have contractions of your uterus. °· You have a gush of fluid from the vagina. °MAKE SURE YOU: °· Understand these instructions.   °· Will watch your condition.   °· Will get help right away if you are not doing   well or get worse.    This information is not intended to replace advice given to you by your health care provider. Make sure you discuss any questions you have with your health care provider.   Document Released: 12/01/2010 Document Revised: 05/27/2013 Document Reviewed: 03/05/2013 Elsevier Interactive Patient Education [employer].  Gonorrhea Gonorrhea is an infection that can cause serious problems. If left untreated, the infection may:   Damage the female or female organs.   Cause women to be unable to have children (sterility).   Harm a fetus if the infected woman is pregnant.  It is important to get treatment for gonorrhea as soon as possible. It is also necessary that all your sexual partners be tested for the infection.  CAUSES  Gonorrhea is caused by bacteria called Neisseria gonorrhoeae. The infection is spread from person to person, usually by sexual contact (such as by anal, vaginal, or oral means). A newborn can contract the infection from his or her mother during birth.  RISK FACTORS  Being a woman younger than 21 years of age who is sexually active.  Being a woman 41 years of age or older who has:  A new sex partner.  More than one sex partner.  A sex partner who has a sexually transmitted disease (STD).  Using condoms inconsistently.  Currently having, or having previously had, an STD.  Exchanging sex or money or drugs. SYMPTOMS  Some people with gonorrhea do not have symptoms. Symptoms may be different in females and males.  Females The most common symptoms are:   Pain in the lower abdomen.   Fever with or without chills.  Other symptoms include:   Abnormal vaginal discharge.   Painful intercourse.   Burning or itching of the vagina or lips of the vagina.   Abnormal vaginal bleeding.   Pain when urinating.   Long-lasting (chronic) pain in the lower abdomen, especially during menstruation or intercourse.   Inability to become pregnant.   Going into premature labor.   Irritation, pain, bleeding, or discharge from the rectum. This may occur if the infection was spread by anal sex.   Sore throat or swollen lymph nodes in the neck. This may occur if the infection was spread by oral sex.  Males The most common symptoms are:   Discharge from the penis.    Pain or burning during urination.   Pain or swelling in the testicles. Other symptoms may include:   Irritation, pain, bleeding, or discharge from the rectum. This may occur if the infection was spread by anal sex.   Sore throat, fever, or swollen lymph nodes in the neck. This may occur if the infection was spread by oral sex.  DIAGNOSIS  A diagnosis is made after a physical exam is done and a sample of discharge is examined under a microscope for the presence of the bacteria. The discharge may be taken from the urethra, cervix, throat, or rectum.  TREATMENT  Gonorrhea is treated with antibiotic medicines. It is important for treatment to begin as soon as possible. Early treatment may prevent some problems from developing. Do not have sex. Avoid all types of sexual activity for 7 days after treatment is complete and until any sex partners have been treated. HOME CARE INSTRUCTIONS   Take medicines only as directed by your health care provider.   Take your antibiotic medicine as directed by your health care provider. Finish the antibiotic even if you start to feel better. Incomplete treatment will  put you at risk for continued infection.   Do not have sex until treatment is complete or as directed by your health care provider.   Keep all follow-up visits as directed by your health care provider.   Not all test results are available during your visit. If your test results are not back during the visit, make an appointment with your health care provider to find out the results. Do not assume everything is normal if you have not heard from your health care provider or the medical facility. It is your responsibility to get your test results.  If you test positive for gonorrhea, inform your recent sexual partners. They need to be checked for gonorrhea even if they do not have symptoms. They may need treatment, even if they test negative for gonorrhea.  SEEK MEDICAL CARE IF:   You  develop any bad reaction to the medicine you were prescribed. This may include:   A rash.   Nausea.   Vomiting.   Diarrhea.   Your symptoms do not improve after a few days of taking antibiotics.   Your symptoms get worse.   You develop increased pain, such as in the testicles (for males) or in the abdomen (for females).  You have a fever. MAKE SURE YOU:   Understand these instructions.  Will watch your condition.  Will get help right away if you are not doing well or get worse.   This information is not intended to replace advice given to you by your health care provider. Make sure you discuss any questions you have with your health care provider.   Document Released: 08/03/2000 Document Revised: 08/27/2014 Document Reviewed: 02/11/2013 Elsevier Interactive Patient Education [employer]2016 Elsevier Inc.

## 2015-07-31 LAB — CULTURE, OB URINE: Culture: >=100000

## 2015-08-01 LAB — GC/CHLAMYDIA PROBE AMP (~~LOC~~) NOT AT ARMC
Chlamydia: NEGATIVE
Neisseria Gonorrhea: NEGATIVE

## 2015-08-04 ENCOUNTER — Emergency Department (HOSPITAL_COMMUNITY)
Admission: EM | Admit: 2015-08-04 | Discharge: 2015-08-04 | Disposition: A | Discharge status: Home / Self Care | Payer: Medicaid Other | Attending: Emergency Medicine | Admitting: Emergency Medicine

## 2015-08-04 ENCOUNTER — Encounter (HOSPITAL_COMMUNITY): Payer: Self-pay | Admitting: Emergency Medicine

## 2015-08-04 DIAGNOSIS — Z79899 Other long term (current) drug therapy: Secondary | ICD-10-CM | POA: Insufficient documentation

## 2015-08-04 DIAGNOSIS — M79601 Pain in right arm: Secondary | ICD-10-CM | POA: Diagnosis present

## 2015-08-04 DIAGNOSIS — M79631 Pain in right forearm: Secondary | ICD-10-CM | POA: Insufficient documentation

## 2015-08-04 DIAGNOSIS — Z87891 Personal history of nicotine dependence: Secondary | ICD-10-CM | POA: Diagnosis not present

## 2015-08-04 DIAGNOSIS — Z862 Personal history of diseases of the blood and blood-forming organs and certain disorders involving the immune mechanism: Secondary | ICD-10-CM | POA: Insufficient documentation

## 2015-08-04 DIAGNOSIS — R202 Paresthesia of skin: Secondary | ICD-10-CM | POA: Diagnosis not present

## 2015-08-04 DIAGNOSIS — Z792 Long term (current) use of antibiotics: Secondary | ICD-10-CM | POA: Diagnosis not present

## 2015-08-04 DIAGNOSIS — Z87448 Personal history of other diseases of urinary system: Secondary | ICD-10-CM | POA: Diagnosis not present

## 2015-08-04 DIAGNOSIS — Z87440 Personal history of urinary (tract) infections: Secondary | ICD-10-CM | POA: Diagnosis not present

## 2015-08-04 DIAGNOSIS — G8929 Other chronic pain: Secondary | ICD-10-CM | POA: Insufficient documentation

## 2015-08-04 DIAGNOSIS — Z8659 Personal history of other mental and behavioral disorders: Secondary | ICD-10-CM | POA: Insufficient documentation

## 2015-08-04 NOTE — ED Notes (Signed)
[facility] had problems starting an IV on her  Pt states the IV infiltrated in her right hand and her hand and wrist swelled up  Pt states now she has pain in her hand from her fingertips up to her elbow

## 2015-08-04 NOTE — ED Notes (Signed)
Ace wrap applied loosely to right wrist per [place], PA-C.

## 2015-08-04 NOTE — ED Provider Notes (Signed)
CSN: [id number]     Arrival date & time 08/04/15  2244 History   First MD Initiated Contact with Patient 08/04/15 2304     Chief Complaint  Patient presents with  . Arm Pain     (Consider location/radiation/quality/duration/timing/severity/associated sxs/prior Treatment) Patient is a 19 y.o. female presenting with arm pain. The history is provided by the patient. No language interpreter was used.  Arm Pain This is a new problem. The current episode started 1 to 4 weeks ago. The problem occurs constantly. The problem has been gradually worsening. Pertinent negatives include no chest pain or fever. Associated symptoms comments: Patient with pain in right arm starting at the site of IV started while hospitalized in late November for hyperemesis of pregnancy. She states that during admission there was loss of the IV causing the arm to swell. She has had pain and swelling since that time including the entire forearm and hand, now with tingling in her fingers. No fever. No redness. .    Past Medical History  Diagnosis Date  . Infection     UTI  . UTI (lower urinary tract infection)   . Anxiety   . Anemia   . Pyelonephritis   . Chronic back pain    Past Surgical History  Procedure Laterality Date  . Induced abortion    . Dilation and curettage of uterus     Family History  Problem Relation Age of Onset  . Alcohol abuse Neg Hx    Social History  Substance Use Topics  . Smoking status: Former Games developermoker  . Smokeless tobacco: Never Used  . Alcohol Use: No   OB History    Gravida Para Term Preterm AB TAB SAB Ectopic Multiple Living   5 2 2  2 1 1   0 2     Review of Systems  Constitutional: Negative for fever.  Respiratory: Negative for shortness of breath.   Cardiovascular: Negative for chest pain.  Musculoskeletal:       See HPI.  Skin: Negative for color change and wound.  Neurological:       C/O tingling right hand      Allergies  Review of patient's allergies  indicates no known allergies.  Home Medications   Prior to Admission medications   Medication Sig Start Date End Date Taking? Authorizing Provider  acetaminophen (TYLENOL) 500 MG tablet Take 500 mg by mouth every 6 (six) hours as needed for headache.    Historical Provider, MD  cyclobenzaprine (FLEXERIL) 10 MG tablet Take 1 tablet (10 mg total) by mouth 3 (three) times daily as needed for muscle spasms. 07/29/15   Nigel BridgemanVicki Latham, CNM  flintstones complete (FLINTSTONES) 60 MG chewable tablet Chew 1 tablet by mouth daily.    Historical Provider, MD  nitrofurantoin, macrocrystal-monohydrate, (MACROBID) 100 MG capsule Take 1 capsule (100 mg total) by mouth 2 (two) times daily. 07/29/15   Nigel BridgemanVicki Latham, CNM  ondansetron (ZOFRAN ODT) 8 MG disintegrating tablet Take 1 tablet (8 mg total) by mouth every 12 (twelve) hours as needed for nausea or vomiting. 07/20/15   Osborn CohoAngela Roberts, MD  pantoprazole (PROTONIX) 20 MG tablet Take 1 tablet (20 mg total) by mouth daily as needed. 07/20/15   Osborn CohoAngela Roberts, MD  potassium chloride 20 MEQ TBCR Take 20 mEq by mouth 2 (two) times daily. For 7 days and then take daily until visit in the office. 07/20/15   Osborn CohoAngela Roberts, MD  promethazine (PHENERGAN) 12.5 MG tablet Take 1 tablet (12.5 mg total) by  mouth every 6 (six) hours as needed for nausea or vomiting. 07/20/15   Osborn Coho, MD   BP 132/49 mmHg  Pulse 66  Temp(Src) 97.5 F (36.4 C) (Oral)  Resp 16  SpO2 100%  LMP 04/23/2015 Physical Exam  Constitutional: She is oriented to person, place, and time. She appears well-developed and well-nourished.  Neck: Normal range of motion.  Cardiovascular: Intact distal pulses.   Pulmonary/Chest: Effort normal.  Musculoskeletal:  Right UE unremarkable in appearance, without redness or warmth. FROM all joints of extremity. There is mild hyperpigmentation at the site of IV dorsal hand that is tender. No palpable "cord" proximally.   Neurological: She is alert and oriented  to person, place, and time.  Skin: Skin is warm and dry. No erythema.  Psychiatric: She has a normal mood and affect.    ED Course  Procedures (including critical care time) Labs Review Labs Reviewed - No data to display  Imaging Review No results found. I have personally reviewed and evaluated these images and lab results as part of my medical decision-making.   EKG Interpretation None      MDM   Final diagnoses:  None    1. UE pain, right  Will have doppler study in am as precaution. Recommended warm compresses and Tylenol, given pregnancy. She will follow up with her doctor for results of ultrasound.    Elpidio Anis, PA-C 08/04/15 2324  Derwood Kaplan, MD 08/05/15 [phone]

## 2015-08-04 NOTE — Discharge Instructions (Signed)
Heat Therapy  Heat therapy can help ease sore, stiff, injured, and tight muscles and joints. Heat relaxes your muscles, which may help ease your pain.   RISKS AND COMPLICATIONS  If you have any of the following conditions, do not use heat therapy unless your health care provider has approved:   Poor circulation.   Healing wounds or scarred skin in the area being treated.   Diabetes, heart disease, or high blood pressure.   Not being able to feel (numbness) the area being treated.   Unusual swelling of the area being treated.   Active infections.   Blood clots.   Cancer.   Inability to communicate pain. This may include young children and people who have problems with their brain function (dementia).   Pregnancy.  Heat therapy should only be used on old, pre-existing, or long-lasting (chronic) injuries. Do not use heat therapy on new injuries unless directed by your health care provider.  HOW TO USE HEAT THERAPY  There are several different kinds of heat therapy, including:   Moist heat pack.   Warm water bath.   Hot water bottle.   Electric heating pad.   Heated gel pack.   Heated wrap.   Electric heating pad.  Use the heat therapy method suggested by your health care provider. Follow your health care provider's instructions on when and how to use heat therapy.  GENERAL HEAT THERAPY RECOMMENDATIONS   Do not sleep while using heat therapy. Only use heat therapy while you are awake.   Your skin may turn pink while using heat therapy. Do not use heat therapy if your skin turns red.   Do not use heat therapy if you have new pain.   High heat or long exposure to heat can cause burns. Be careful when using heat therapy to avoid burning your skin.   Do not use heat therapy on areas of your skin that are already irritated, such as with a rash or sunburn.  SEEK MEDICAL CARE IF:   You have blisters, redness, swelling, or numbness.   You have new pain.   Your pain is worse.  MAKE SURE  YOU:   Understand these instructions.   Will watch your condition.   Will get help right away if you are not doing well or get worse.     This information is not intended to replace advice given to you by your health care provider. Make sure you discuss any questions you have with your health care provider.     Document Released: 10/29/2011 Document Revised: 08/27/2014 Document Reviewed: 09/29/2013  Elsevier Interactive Patient Education 2016 Elsevier Inc.

## 2015-08-05 ENCOUNTER — Ambulatory Visit (HOSPITAL_COMMUNITY): Payer: No Typology Code available for payment source

## 2015-08-08 ENCOUNTER — Ambulatory Visit (HOSPITAL_COMMUNITY): Admission: RE | Admit: 2015-08-08 | Payer: Medicaid Other | Source: Ambulatory Visit

## 2015-08-09 ENCOUNTER — Ambulatory Visit (HOSPITAL_COMMUNITY): Payer: Medicaid Other | Attending: Emergency Medicine

## 2015-08-21 NOTE — L&D Delivery Note (Signed)
Operative Delivery Note At 9:05 PM a viable female "Jace" was delivered via Vaginal, Spontaneous Delivery. Presentation: Cephalic; Position: OA, restituting to Right,, Occiput,, Anterior; Station: +5.  Delivery of the head: 02/04/2016  9:04 PM First maneuver: 02/04/2016  9:04 PM, McRoberts Second maneuver: 02/04/2016  9:04 PM, Suprapubic Pressure Third maneuver: NA   Fourth maneuver: NA Fifth maneuver: NA Sixth maneuver: NA  CAN -- infant somersaulted through. End stage mec noted. Infant floppy and apenic at delivery, unresponsive to drying and bulb suction, therefore cord doubly clamped and cut, and infant taken to warmer where he began to cry spontaneously.Code Apgar contacted and upon NICU team arrival at 2 minutes of age, infant had good cry and tone.   APGAR: 7, 9; weight pending.   Placenta status: Intact, Spontaneous Schultz.   Cord: 3 vessels with the following complications: None.  Cord pH: NA  Anesthesia: Epidural  Episiotomy: None Lacerations: None Suture Repair: NA Est. Blood Loss (mL): 300  Mom to postpartum.  Baby to Couplet care / Skin to Skin.  Mom plans to breastfeed.  Plans Depo prior to discharge (ordered).  Plans outpt circ.  Sherre ScarletWILLIAMS, Akylah Hascall 02/04/2016, 9:34 PM

## 2015-09-12 NOTE — Telephone Encounter (Signed)
Error

## 2015-11-06 DIAGNOSIS — B009 Herpesviral infection, unspecified: Secondary | ICD-10-CM | POA: Insufficient documentation

## 2015-11-09 ENCOUNTER — Encounter (HOSPITAL_COMMUNITY): Payer: Self-pay

## 2015-11-09 ENCOUNTER — Inpatient Hospital Stay (HOSPITAL_COMMUNITY)
Admission: AD | Admit: 2015-11-09 | Discharge: 2015-11-10 | Disposition: A | Discharge status: Home / Self Care | Payer: Medicaid Other | Source: Ambulatory Visit | Attending: Obstetrics and Gynecology | Admitting: Obstetrics and Gynecology

## 2015-11-09 DIAGNOSIS — O26892 Other specified pregnancy related conditions, second trimester: Secondary | ICD-10-CM | POA: Diagnosis not present

## 2015-11-09 DIAGNOSIS — Z3A27 27 weeks gestation of pregnancy: Secondary | ICD-10-CM | POA: Diagnosis not present

## 2015-11-09 DIAGNOSIS — Z87891 Personal history of nicotine dependence: Secondary | ICD-10-CM | POA: Insufficient documentation

## 2015-11-09 DIAGNOSIS — G4700 Insomnia, unspecified: Secondary | ICD-10-CM | POA: Diagnosis not present

## 2015-11-09 DIAGNOSIS — N898 Other specified noninflammatory disorders of vagina: Secondary | ICD-10-CM | POA: Diagnosis present

## 2015-11-09 DIAGNOSIS — B373 Candidiasis of vulva and vagina: Secondary | ICD-10-CM

## 2015-11-09 DIAGNOSIS — B3731 Acute candidiasis of vulva and vagina: Secondary | ICD-10-CM

## 2015-11-09 LAB — WET PREP, GENITAL
Clue Cells Wet Prep HPF POC: NONE SEEN
Sperm: NONE SEEN
Trich, Wet Prep: NONE SEEN

## 2015-11-09 LAB — URINE MICROSCOPIC-ADD ON

## 2015-11-09 LAB — URINALYSIS, ROUTINE W REFLEX MICROSCOPIC
Bilirubin Urine: NEGATIVE
Glucose, UA: NEGATIVE mg/dL
Hgb urine dipstick: NEGATIVE
Ketones, ur: NEGATIVE mg/dL
Nitrite: NEGATIVE
Protein, ur: NEGATIVE mg/dL
Specific Gravity, Urine: 1.01 (ref 1.005–1.030)
pH: 7 (ref 5.0–8.0)

## 2015-11-09 NOTE — MAU Provider Note (Signed)
History    Melanie Hancock is a 20y.o. [id number] at 27.4wks who presents, after phone call, for vaginal discharge.  Patient states discharge started yesterday night after sexual intercourse.  Patient reports discharge is orange-red in color and watery in consistency.  Patient states yesterday it was "just coming out of me," while today it is only with wiping.  Patient states she has been having contractions for 2-3 weeks, but they have increased in the last few days. Patient endorses good fetal movement. Patient reports having thick discharge similar to a yeast infection and has been taking clindamycin that she has left over from "a hair bump."  Patient states discharge is "irritating" and itching only with discharge. Patient denies problems with urination or constipation/diarrhea.  Patient also reports issues with insomnia and requests medication for assistance.    Patient Active Problem List   Diagnosis Date Noted  . STD exposure 07/29/2015  . Hyperemesis 07/17/2015  . Anxiety 03/28/2014  . Chronic back pain 03/28/2014  . Anemia--Hgb 8.8 on 06/14/15 05/26/2013  . Hx pyelonephritis 2014 05/26/2013    No chief complaint on file.  HPI  OB History    Gravida Para Term Preterm AB TAB SAB Ectopic Multiple Living   5 2 2  2 1 1   0 2      Past Medical History  Diagnosis Date  . Infection     UTI  . UTI (lower urinary tract infection)   . Anxiety   . Anemia   . Pyelonephritis   . Chronic back pain     Past Surgical History  Procedure Laterality Date  . Induced abortion    . Dilation and curettage of uterus      Family History  Problem Relation Age of Onset  . Alcohol abuse Neg Hx     Social History  Substance Use Topics  . Smoking status: Former Games developermoker  . Smokeless tobacco: Never Used  . Alcohol Use: No    Allergies: No Known Allergies  Prescriptions prior to admission  Medication Sig Dispense Refill Last Dose  . acetaminophen (TYLENOL) 500 MG tablet Take 500 mg by  mouth every 6 (six) hours as needed for headache.   Past Month at Unknown time  . CLINDAMYCIN HCL PO Take 1 capsule by mouth 2 (two) times daily.   08/04/2015 at Unknown time  . cyclobenzaprine (FLEXERIL) 10 MG tablet Take 1 tablet (10 mg total) by mouth 3 (three) times daily as needed for muscle spasms. 30 tablet 2 08/04/2015 at Unknown time  . nitrofurantoin, macrocrystal-monohydrate, (MACROBID) 100 MG capsule Take 1 capsule (100 mg total) by mouth 2 (two) times daily. 14 capsule 0 08/03/2015 at Unknown time  . ondansetron (ZOFRAN ODT) 8 MG disintegrating tablet Take 1 tablet (8 mg total) by mouth every 12 (twelve) hours as needed for nausea or vomiting. 30 tablet 3 08/03/2015 at Unknown time  . pantoprazole (PROTONIX) 20 MG tablet Take 1 tablet (20 mg total) by mouth daily as needed. 30 tablet 3   . potassium chloride 20 MEQ TBCR Take 20 mEq by mouth 2 (two) times daily. For 7 days and then take daily until visit in the office. 20 tablet 0 08/04/2015 at Unknown time  . Prenatal Vit-Fe Fumarate-FA (PRENATAL MULTIVITAMIN) TABS tablet Take 1 tablet by mouth daily at 12 noon.   08/04/2015 at Unknown time  . promethazine (PHENERGAN) 12.5 MG tablet Take 1 tablet (12.5 mg total) by mouth every 6 (six) hours as needed for nausea or  vomiting. 30 tablet 3 Past Week at Unknown time    ROS  See HPI Above Physical Exam   Blood pressure 112/61, pulse 77, temperature 98.7 F (37.1 C), temperature source Oral, resp. rate 16, height  (1.651 m), weight 78.019 kg (172 lb), last menstrual period 04/23/2015, SpO2 100 %, not currently breastfeeding.  Results for orders placed or performed during the hospital encounter of 11/09/15 (from the past 24 hour(s))  Urinalysis, Routine w reflex microscopic (not at [facility])     Status: Abnormal   Collection Time: 11/09/15  7:35 PM  Result Value Ref Range   Color, Urine YELLOW YELLOW   APPearance CLEAR CLEAR   Specific Gravity, Urine 1.010 1.005 - 1.030   pH 7.0 5.0 -  8.0   Glucose, UA NEGATIVE NEGATIVE mg/dL   Hgb urine dipstick NEGATIVE NEGATIVE   Bilirubin Urine NEGATIVE NEGATIVE   Ketones, ur NEGATIVE NEGATIVE mg/dL   Protein, ur NEGATIVE NEGATIVE mg/dL   Nitrite NEGATIVE NEGATIVE   Leukocytes, UA LARGE (A) NEGATIVE  Urine microscopic-add on     Status: Abnormal   Collection Time: 11/09/15  7:35 PM  Result Value Ref Range   Squamous Epithelial / LPF 6-30 (A) NONE SEEN   WBC, UA 0-5 0 - 5 WBC/hpf   RBC / HPF 0-5 0 - 5 RBC/hpf   Bacteria, UA RARE (A) NONE SEEN   Urine-Other AMORPHOUS URATES/PHOSPHATES   Wet prep, genital     Status: Abnormal   Collection Time: 11/09/15 11:20 PM  Result Value Ref Range   Yeast Wet Prep HPF POC PRESENT (A) NONE SEEN   Trich, Wet Prep NONE SEEN NONE SEEN   Clue Cells Wet Prep HPF POC NONE SEEN NONE SEEN   WBC, Wet Prep HPF POC FEW (A) NONE SEEN   Sperm NONE SEEN     Physical Exam  Vitals reviewed. Constitutional: She is oriented to person, place, and time. She appears well-developed and well-nourished. No distress.  HENT:  Head: Normocephalic and atraumatic.  Eyes: Conjunctivae are normal.  Neck: Normal range of motion.  Cardiovascular: Normal rate.   Respiratory: Effort normal.  GI: Soft.  Genitourinary: Uterus is enlarged. Cervix exhibits discharge. No bleeding in the vagina. Vaginal discharge found.  Sterile Speculum Exam: -Vaginal Vault: Moderate amt of thin white cottage cheese like discharge -wet prep collected -Cervix:Thin white discharge from os-Appears closed-GC/CT collected -Bimanual Exam: Closed at internal-external 1cm/Long/Thick/Ballotable   Musculoskeletal: Normal range of motion. She exhibits no edema.  Neurological: She is alert and oriented to person, place, and time.  Skin: Skin is warm and dry.     FHR:150 bpm, Mod Var, -Decels, +Accels UC: None graphed ED Course  Assessment: IUP at 27.5wks Cat I FT Vaginal Discharge Insomnia  Plan: -PE as charted -Vaginal hygiene  discussed -Await Lab results: Wet Prep, GC/CT, and UA  Follow Up (0010) -Candidiasis of vulva and vagina -RX for terazol 3 -Rx for Ambien  one tablet at hs, prn.  Disp 14, RF none -Keep appt as scheduled: 11/10/2015 -Encouraged to call if any questions or concerns arise prior to next scheduled office visit.  -Discharged to home in stable condition  Cherre Robins CNM, MSN 11/09/2015 10:28 PM

## 2015-11-09 NOTE — MAU Note (Signed)
Before leaving triage pt also reports blurred vision at times (not now), contractions at times (not now), and vaginal irritation and itching. States she is currently taking some "left-over" clindamycin.

## 2015-11-09 NOTE — MAU Note (Signed)
Pt reports she has had a thin orange discharge since last pm. Denies pain.

## 2015-11-10 MED ORDER — ZOLPIDEM TARTRATE 5 MG PO TABS: 5.0000 mg | ORAL_TABLET | Freq: Every evening | ORAL | Status: DC | PRN

## 2015-11-10 MED ORDER — TERCONAZOLE 0.8 % VA CREA: 1.0000 | TOPICAL_CREAM | Freq: Every day | VAGINAL | Status: DC

## 2015-11-10 NOTE — Discharge Instructions (Signed)

## 2015-11-11 LAB — OB RESULTS CONSOLE GC/CHLAMYDIA
Chlamydia: NEGATIVE
Gonorrhea: NEGATIVE

## 2015-11-11 LAB — GC/CHLAMYDIA PROBE AMP (~~LOC~~) NOT AT ARMC
Chlamydia: NEGATIVE
Neisseria Gonorrhea: NEGATIVE

## 2015-11-28 LAB — OB RESULTS CONSOLE ABO/RH: RH Type: POSITIVE

## 2015-11-28 LAB — OB RESULTS CONSOLE RPR
RPR: NONREACTIVE
RPR: NONREACTIVE

## 2015-11-28 LAB — OB RESULTS CONSOLE HEPATITIS B SURFACE ANTIGEN
Hepatitis B Surface Ag: NEGATIVE
Hepatitis B Surface Ag: NEGATIVE

## 2015-11-28 LAB — OB RESULTS CONSOLE GC/CHLAMYDIA
Chlamydia: NEGATIVE
Gonorrhea: NEGATIVE

## 2015-11-28 LAB — OB RESULTS CONSOLE ANTIBODY SCREEN: Antibody Screen: NEGATIVE

## 2015-11-28 LAB — OB RESULTS CONSOLE HIV ANTIBODY (ROUTINE TESTING)
HIV: NONREACTIVE
HIV: NONREACTIVE

## 2015-11-28 LAB — OB RESULTS CONSOLE RUBELLA ANTIBODY, IGM
Rubella: IMMUNE
Rubella: IMMUNE

## 2015-12-15 ENCOUNTER — Encounter: Payer: Self-pay | Admitting: Hematology

## 2015-12-15 ENCOUNTER — Telehealth: Payer: Self-pay | Admitting: Hematology

## 2015-12-15 NOTE — Telephone Encounter (Signed)
Spoke with patient re new patient appointment with Dr. Candise CheKale 5/15 @ 2 pm arrive 1:30 pm. Patient demographic and insurance information confirmed. Due to prior history of no shows within facility patient has been made aware that is she does not keep this appointment we will not reschedule. Also reach out to referring office re patient's history of past no shows - left message for Joni Reiningicole to call me back to discuss.

## 2015-12-16 ENCOUNTER — Inpatient Hospital Stay (HOSPITAL_COMMUNITY)
Admission: AD | Admit: 2015-12-16 | Discharge: 2015-12-16 | Disposition: A | Discharge status: Home / Self Care | Payer: Medicaid Other | Source: Ambulatory Visit | Attending: Obstetrics and Gynecology | Admitting: Obstetrics and Gynecology

## 2015-12-16 ENCOUNTER — Encounter (HOSPITAL_COMMUNITY): Payer: Self-pay | Admitting: *Deleted

## 2015-12-16 DIAGNOSIS — O99343 Other mental disorders complicating pregnancy, third trimester: Secondary | ICD-10-CM | POA: Diagnosis not present

## 2015-12-16 DIAGNOSIS — F419 Anxiety disorder, unspecified: Secondary | ICD-10-CM | POA: Diagnosis not present

## 2015-12-16 DIAGNOSIS — Z87891 Personal history of nicotine dependence: Secondary | ICD-10-CM | POA: Diagnosis not present

## 2015-12-16 DIAGNOSIS — Z3A32 32 weeks gestation of pregnancy: Secondary | ICD-10-CM | POA: Diagnosis not present

## 2015-12-16 DIAGNOSIS — O2243 Hemorrhoids in pregnancy, third trimester: Secondary | ICD-10-CM | POA: Insufficient documentation

## 2015-12-16 MED ORDER — HYDROCORTISONE ACE-PRAMOXINE 1-1 % RE FOAM: 1.0000 | Freq: Two times a day (BID) | RECTAL | Status: DC

## 2015-12-16 NOTE — MAU Provider Note (Signed)
History    Melanie Hancock is 20y.o. [id number] at 32.6wks who presents, unannounced, for hemorrhoids.  Patient states she has been constipated for the last week, but went to the bathroom daily for the last 2-3 days.  Patient states that she did not require any medications to have a bowel movement.  Patient endorses fetal movement and occasional contractions, but denies LOF and VB.   Patient Active Problem List   Diagnosis Date Noted  . STD exposure 07/29/2015  . Hyperemesis 07/17/2015  . Anxiety 03/28/2014  . Chronic back pain 03/28/2014  . Anemia--Hgb 8.8 on 06/14/15 05/26/2013  . Hx pyelonephritis 2014 05/26/2013    Chief Complaint  Patient presents with  . Hemorrhoids   HPI  OB History    Gravida Para Term Preterm AB TAB SAB Ectopic Multiple Living   5 2 2  2 1 1   0 2      Past Medical History  Diagnosis Date  . Infection     UTI  . UTI (lower urinary tract infection)   . Anxiety   . Anemia   . Pyelonephritis   . Chronic back pain     Past Surgical History  Procedure Laterality Date  . Induced abortion    . Dilation and curettage of uterus      Family History  Problem Relation Age of Onset  . Alcohol abuse Neg Hx     Social History  Substance Use Topics  . Smoking status: Former Games developermoker  . Smokeless tobacco: Never Used  . Alcohol Use: No    Allergies: No Known Allergies  Prescriptions prior to admission  Medication Sig Dispense Refill Last Dose  . cyclobenzaprine (FLEXERIL) 10 MG tablet Take 10 mg by mouth 3 (three) times daily as needed for muscle spasms.   0 Past Week at Unknown time  . ferrous gluconate (FERGON) 324 MG tablet Take 1 tablet by mouth daily.  2 Past Week at Unknown time  . oxyCODONE-acetaminophen (PERCOCET/ROXICET) 5-325 MG tablet Take 1 tablet by mouth every 4 (four) hours as needed for severe pain.   12/15/2015 at Unknown time  . Pediatric Multiple Vit-C-FA (FLINSTONES GUMMIES OMEGA-3 DHA PO) Take 2 tablets by mouth daily.   12/15/2015 at  Unknown time  . zolpidem (AMBIEN) 5 MG tablet Take 1 tablet (5 mg total) by mouth at bedtime as needed for sleep. 14 tablet 0 Past Week at Unknown time  . terconazole (TERAZOL 3) 0.8 % vaginal cream Place 1 applicator vaginally at bedtime. For 3 nights. (Patient not taking: Reported on 12/16/2015) 20 g 0     ROS  See HPI Above Physical Exam   Blood pressure 109/54, pulse 80, temperature 98.5 F (36.9 C), temperature source Oral, resp. rate 16, height 5' 5.5" (1.664 m), weight 79.039 kg (174 lb 4 oz), last menstrual period 04/23/2015, not currently breastfeeding.  No results found for this or any previous visit (from the past 24 hour(s)).  Physical Exam  Constitutional: She is oriented to person, place, and time. She appears well-developed and well-nourished.  HENT:  Head: Normocephalic and atraumatic.  Eyes: Conjunctivae are normal.  Neck: Normal range of motion.  Cardiovascular: Normal rate.   Respiratory: Effort normal.  GI: Soft.  Genitourinary: Rectal exam shows external hemorrhoid.  Hemorrhoid: Soft NT ~1cm  Musculoskeletal: Normal range of motion.  Neurological: She is alert and oriented to person, place, and time.  Skin: Skin is warm and dry.     FHR: 135 bpm, Mod Var, -Decels, +  Accels UC: None  ED Course  Assessment: IUP at 32.6wks Cat I FT Hemorrhoids  Plan: -Discussed proper MAU critiera for visits -Protofoam HCL sent  -Hemorrhoid mgmt -Discussed office visits -Encouraged to call if any questions or concerns arise prior to next scheduled office visit.  -Discharged to home in stable condition  Cherre Robins CNM, MSN 12/16/2015 6:18 PM

## 2015-12-16 NOTE — Discharge Instructions (Signed)

## 2015-12-16 NOTE — MAU Note (Signed)
Urine sent to lab 

## 2015-12-16 NOTE — MAU Note (Signed)
Patient presents at [redacted] weeks gestation with c/o hemorrhoidal pain X 1 week. Fetus active. Denies bleeding or discharge.

## 2015-12-21 ENCOUNTER — Inpatient Hospital Stay (HOSPITAL_COMMUNITY)
Admission: AD | Admit: 2015-12-21 | Discharge: 2015-12-22 | Disposition: A | Discharge status: Home / Self Care | Payer: Medicaid Other | Source: Ambulatory Visit | Attending: Obstetrics & Gynecology | Admitting: Obstetrics & Gynecology

## 2015-12-21 ENCOUNTER — Other Ambulatory Visit: Payer: Self-pay | Admitting: Obstetrics and Gynecology

## 2015-12-21 DIAGNOSIS — Z3A33 33 weeks gestation of pregnancy: Secondary | ICD-10-CM | POA: Diagnosis not present

## 2015-12-21 DIAGNOSIS — D649 Anemia, unspecified: Secondary | ICD-10-CM | POA: Diagnosis present

## 2015-12-21 DIAGNOSIS — D509 Iron deficiency anemia, unspecified: Secondary | ICD-10-CM | POA: Insufficient documentation

## 2015-12-21 DIAGNOSIS — O99013 Anemia complicating pregnancy, third trimester: Secondary | ICD-10-CM | POA: Insufficient documentation

## 2015-12-21 LAB — PREPARE RBC (CROSSMATCH)

## 2015-12-21 MED ORDER — SODIUM CHLORIDE 0.9 % IV SOLN: Freq: Once | INTRAVENOUS | Status: DC

## 2015-12-21 MED ORDER — DIPHENHYDRAMINE HCL 25 MG PO CAPS
25.0000 mg | ORAL_CAPSULE | Freq: Once | ORAL | Status: AC
  Administered 2015-12-22: 25 mg via ORAL
  Filled 2015-12-21: qty 1

## 2015-12-21 MED ORDER — ACETAMINOPHEN 325 MG PO TABS
650.0000 mg | ORAL_TABLET | Freq: Once | ORAL | Status: AC
  Administered 2015-12-22: 650 mg via ORAL
  Filled 2015-12-21: qty 2

## 2015-12-21 MED ORDER — SODIUM CHLORIDE 0.9 % IV SOLN
Freq: Once | INTRAVENOUS | Status: AC
  Administered 2015-12-21: via INTRAVENOUS

## 2015-12-21 NOTE — MAU Note (Signed)
Pt may go to room 317 for blood transfusion. Thornell MuleK Williams CNM notified of room placement.

## 2015-12-21 NOTE — Progress Notes (Signed)
Patient admitted through MAU alert and oriented. Here for blood transfusion. Oriented to the room. Safety measures put in place and call bell placed within reach.Marland Kitchen.Marland Kitchen..Marland Kitchen

## 2015-12-22 ENCOUNTER — Encounter (HOSPITAL_COMMUNITY): Payer: Self-pay

## 2015-12-22 DIAGNOSIS — D649 Anemia, unspecified: Secondary | ICD-10-CM | POA: Diagnosis present

## 2015-12-22 LAB — CBC
HCT: 30.8 % — ABNORMAL LOW (ref 36.0–46.0)
Hemoglobin: 9.6 g/dL — ABNORMAL LOW (ref 12.0–15.0)
MCH: 21.8 pg — ABNORMAL LOW (ref 26.0–34.0)
MCHC: 31.2 g/dL (ref 30.0–36.0)
MCV: 69.8 fL — ABNORMAL LOW (ref 78.0–100.0)
Platelets: 328 10*3/uL (ref 150–400)
RBC: 4.41 MIL/uL (ref 3.87–5.11)
RDW: 19.4 % — ABNORMAL HIGH (ref 11.5–15.5)
WBC: 8.1 10*3/uL (ref 4.0–10.5)

## 2015-12-22 NOTE — Discharge Instructions (Signed)
Iron Deficiency Anemia, Adult °Anemia is a condition in which there are less red blood cells or hemoglobin in the blood than normal. Hemoglobin is the part of red blood cells that carries oxygen. Iron deficiency anemia is anemia caused by too little iron. It is the most common type of anemia. It may leave you tired and short of breath. °CAUSES  °· Lack of iron in the diet. °· Poor absorption of iron, as seen with intestinal disorders. °· Intestinal bleeding. °· Heavy periods. °SIGNS AND SYMPTOMS  °Mild anemia may not be noticeable. Symptoms may include: °· Fatigue. °· Headache. °· Pale skin. °· Weakness. °· Tiredness. °· Shortness of breath. °· Dizziness. °· Cold hands and feet. °· Fast or irregular heartbeat. °DIAGNOSIS  °Diagnosis requires a thorough evaluation and physical exam by your health care provider. Blood tests are generally used to confirm iron deficiency anemia. Additional tests may be done to find the underlying cause of your anemia. These may include: °· Testing for blood in the stool (fecal occult blood test). °· A procedure to see inside the colon and rectum (colonoscopy). °· A procedure to see inside the esophagus and stomach (endoscopy). °TREATMENT  °Iron deficiency anemia is treated by correcting the cause of the deficiency. Treatment may involve: °· Adding iron-rich foods to your diet. °· Taking iron supplements. Pregnant or breastfeeding women need to take extra iron because their normal diet usually does not provide the required amount. °· Taking vitamins. Vitamin C improves the absorption of iron. Your health care provider may recommend that you take your iron tablets with a glass of orange juice or vitamin C supplement. °· Medicines to make heavy menstrual flow lighter. °· Surgery. °HOME CARE INSTRUCTIONS  °· Take iron as directed by your health care provider. °· If you cannot tolerate taking iron supplements by mouth, talk to your health care provider about taking them through a vein  (intravenously) or an injection into a muscle. °· For the best iron absorption, iron supplements should be taken on an empty stomach. If you cannot tolerate them on an empty stomach, you may need to take them with food. °· Do not drink milk or take antacids at the same time as your iron supplements. Milk and antacids may interfere with the absorption of iron. °· Iron supplements can cause constipation. Make sure to include fiber in your diet to prevent constipation. A stool softener may also be recommended. °· Take vitamins as directed by your health care provider. °· Eat a diet rich in iron. Foods high in iron include liver, lean beef, whole-grain bread, eggs, dried fruit, and dark green leafy vegetables. °SEEK IMMEDIATE MEDICAL CARE IF:  °· You faint. If this happens, do not drive. Call your local emergency services (911 in [place] if no other help is available. °· You have chest pain. °· You feel nauseous or vomit. °· You have severe or increased shortness of breath with activity. °· You feel weak. °· You have a rapid heartbeat. °· You have unexplained sweating. °· You become light-headed when getting up from a chair or bed. °MAKE SURE YOU:  °· Understand these instructions. °· Will watch your condition. °· Will get help right away if you are not doing well or get worse. °  °This information is not intended to replace advice given to you by your health care provider. Make sure you discuss any questions you have with your health care provider. °  °Document Released: 08/03/2000 Document Revised: 08/27/2014 Document Reviewed: 04/13/2013 °Elsevier   Interactive Patient Education 2016 Elsevier Inc. Pregnancy and Anemia Anemia is a condition in which the concentration of red blood cells or hemoglobin in the blood is below normal. Hemoglobin is a substance in red blood cells that carries oxygen to the tissues of the body. Anemia results in not enough oxygen reaching these tissues.  Anemia during pregnancy is common  because the fetus uses more iron and folic acid as it is developing. Your body may not produce enough red blood cells because of this. Also, during pregnancy, the liquid part of the blood (plasma) increases by about 50%, and the red blood cells increase by only 25%. This lowers the concentration of the red blood cells and creates a natural anemia-like situation.  CAUSES  The most common cause of anemia during pregnancy is not having enough iron in the body to make red blood cells (iron deficiency anemia). Other causes may include:  Folic acid deficiency.  Vitamin B12 deficiency.  Certain prescription or over-the-counter medicines.  Certain medical conditions or infections that destroy red blood cells.  A low platelet count and bleeding caused by antibodies that go through the placenta to the fetus from the mother's blood. SIGNS AND SYMPTOMS  Mild anemia may not be noticeable. If it becomes severe, symptoms may include:  Tiredness.  Shortness of breath, especially with exercise.  Weakness.  Fainting.  Pale looking skin.  Headaches.  Feeling a fast or irregular heartbeat (palpitations). DIAGNOSIS  The type of anemia is usually diagnosed from your family and medical history and blood tests. TREATMENT  Treatment of anemia during pregnancy depends on the cause of the anemia. Treatment can include:  Supplements of iron, vitamin B12, or folic acid.  A blood transfusion. This may be needed if blood loss is severe.  Hospitalization. This may be needed if there is significant continual blood loss.  Dietary changes. HOME CARE INSTRUCTIONS   Follow your dietitian's or health care provider's dietary recommendations.  Increase your vitamin C intake. This will help the stomach absorb more iron.  Eat a diet rich in iron. This would include foods such as:  Liver.  Beef.  Whole grain bread.  Eggs.  Dried fruit.  Take iron and vitamins as directed by your health care  provider.  Eat green leafy vegetables. These are a good source of folic acid. SEEK MEDICAL CARE IF:   You have frequent or lasting headaches.  You are looking pale.  You are bruising easily. SEEK IMMEDIATE MEDICAL CARE IF:   You have extreme weakness, shortness of breath, or chest pain.  You become dizzy or have trouble concentrating.  You have heavy vaginal bleeding.  You develop a rash.  You have bloody or black, tarry stools.  You faint.  You vomit up blood.  You vomit repeatedly.  You have abdominal pain.  You have a fever or persistent symptoms for more than 2-3 days.  You have a fever and your symptoms suddenly get worse.  You are dehydrated. MAKE SURE YOU:   Understand these instructions.  Will watch your condition.  Will get help right away if you are not doing well or get worse.   This information is not intended to replace advice given to you by your health care provider. Make sure you discuss any questions you have with your health care provider.   Document Released: 08/03/2000 Document Revised: 05/27/2013 Document Reviewed: 03/18/2013 Elsevier Interactive Patient Education 2016 Elsevier Inc. Blood Transfusion  A blood transfusion is a procedure in which you  receive donated blood through an IV tube. You may need a blood transfusion because of illness, surgery, or injury. The blood may come from a donor, or it may be your own blood that you donated previously. The blood given in a transfusion is made up of different types of cells. You may receive:  Red blood cells. These carry oxygen and replace lost blood.  Platelets. These control bleeding.  Plasma. Thishelps blood to clot. If you have hemophilia or another clotting disorder, you may also receive other types of blood products. LET [facility] PROVIDER KNOW ABOUT:  Any allergies you have.  All medicines you are taking, including vitamins, herbs, eye drops, creams, and over-the-counter  medicines.  Previous problems you or members of your family have had with the use of anesthetics.  Any blood disorders you have.  Previous surgeries you have had.  Any medical conditions you may have.  Any previous reactions you have had during a blood transfusion.  RISKS AND COMPLICATIONS Generally, this is a safe procedure. However, problems may occur, including:  Having an allergic reaction to something in the donated blood.  Fever. This may be a reaction to the white blood cells in the transfused blood.  Iron overload. This can happen from having many transfusions.  Transfusion-related acute lung injury (TRALI). This is a rare reaction that causes lung damage. The cause is not known.TRALI can occur within hours of a transfusion or several days later.  Sudden (acute) or delayed hemolytic reactions. This happens if your blood does not match the cells in your transfusion. Your body's defense system (immune system) may try to attack the new cells. This complication is rare.  Infection. This is rare. BEFORE THE PROCEDURE  You may have a blood test to determine your blood type. This is necessary to know what kind of blood your body will accept.  If you are going to have a planned surgery, you may donate your own blood. This may be done in case you need to have a transfusion.  If you have had an allergic reaction to a transfusion in the past, you may be given medicine to help prevent a reaction. Take this medicine only as directed by your health care provider.  You will have your temperature, blood pressure, and pulse monitored before the transfusion. PROCEDURE   An IV will be started in your hand or arm.  The bag of donated blood will be attached to your IV tube and given into your vein.  Your temperature, blood pressure, and pulse will be monitored regularly during the transfusion. This monitoring is done to detect early signs of a transfusion reaction.  If you have any  signs or symptoms of a reaction, your transfusion will be stopped and you may be given medicine.  When the transfusion is over, your IV will be removed.  Pressure may be applied to the IV site for a few minutes.  A bandage (dressing) will be applied. The procedure may vary among health care providers and hospitals. AFTER THE PROCEDURE  Your blood pressure, temperature, and pulse will be monitored regularly.   This information is not intended to replace advice given to you by your health care provider. Make sure you discuss any questions you have with your health care provider.   Document Released: 08/03/2000 Document Revised: 08/27/2014 Document Reviewed: 06/16/2014 Elsevier Interactive Patient Education [employer]2016 Elsevier Inc.

## 2015-12-22 NOTE — Discharge Summary (Signed)
Obstetric Discharge Summary Reason for Admission: Iron deficiency anemia of pregnancy Prenatal Procedures:   Blood transfusion, 3 units   HEMOGLOBIN  Date Value Ref Range Status  12/22/2015 9.6* 12.0 - 15.0 g/dL Final   HCT  Date Value Ref Range Status  12/22/2015 30.8* 36.0 - 46.0 % Final     Physical Exam  Constitutional: She is oriented to person, place, and time and well-developed, well-nourished, and in no distress.  HENT:  Head: Normocephalic.  Eyes: Pupils are equal, round, and reactive to light.  Neck: Normal range of motion.  Cardiovascular: Normal rate and regular rhythm.   Pulmonary/Chest: Effort normal.  Abdominal: Soft.  Musculoskeletal: Normal range of motion.  Neurological: She is alert and oriented to person, place, and time.  Skin: Skin is warm and dry.  Psychiatric: Affect and judgment normal.   DVT Evaluation: No evidence of DVT seen on physical exam.  Discharge Diagnoses: Chronic Anemia  Discharge Information: Date: 12/22/2015 Activity: unrestricted Diet: routine Medications: None Condition: stable Instructions: Follow up with  Dr. Candise CheKale in Outpatient Hematology on 01/02/16 at 1330 Discharge to: home Follow-up Information    Follow up with Hematology On 01/02/2016.   Why:  To see a provider for follow up , Dr. Gust BroomsKale      [facility]   Specialty:  Obstetrics and Gynecology   Why:  Scheduled prenatal visit   Contact information:   [address] [place] [id number] [phone]      Alphonzo SeveranceRachel Danyle Boening 12/22/2015, 4:07 PM

## 2015-12-22 NOTE — Telephone Encounter (Signed)
[facility] re confirming patient's appointment in our office. Spoke with Britta MccreedyBarbara re patient being seen 5/15 @ 2 pm by Dr. Candise CheKale to arrive 1:30 pm. Also [facility] to [facility]'s history of no show and informed her that if patient does not keep appointment we will not be able to reschedule her appointment. Patient has also been informed.

## 2015-12-22 NOTE — Progress Notes (Signed)
Discharge teaching complete. Pt understood all information and did not have any questions. Pt ambulated out of the hospital and discharged home to family.  

## 2015-12-22 NOTE — Progress Notes (Addendum)
Hospital day #1 pregnancy at 3959w5d--Chronic iron deficiency anemia S/p blood transfusion, 3 units.  S:  Easy to awake but very sleepy      Perception of contractions: none      Vaginal bleeding: none        Vaginal discharge:  none  O: BP 102/44 mmHg  Pulse 70  Temp(Src) 97.8 F (36.6 C) (Oral)  Resp 18  Ht 5' 5.5" (1.664 m)  Wt 79.833 kg (176 lb)  BMI 28.83 kg/m2  SpO2 100%  LMP 04/23/2015      Fetal tracings:  Doppler, 133 bpm      Contractions:    nonw      Uterus gravid and non-tender      Extremities: extremities normal, atraumatic, no cyanosis or edema and no significant edema and no signs of DVT          Labs:   Results for orders placed or performed during the hospital encounter of 12/21/15 (from the past 24 hour(s))  Type and screen     Status: None (Preliminary result)   Collection Time: 12/21/15  9:49 PM  Result Value Ref Range   ABO/RH(D) A POS    Antibody Screen NEG    Sample Expiration 12/24/2015    Unit Number [id number]    Blood Component Type RED CELLS,LR    Unit division 00    Status of Unit ISSUED    Transfusion Status OK TO TRANSFUSE    Crossmatch Result Compatible    Unit Number [id number]    Blood Component Type RED CELLS,LR    Unit division 00    Status of Unit ISSUED    Transfusion Status OK TO TRANSFUSE    Crossmatch Result Compatible    Unit Number [id number]    Blood Component Type RED CELLS,LR    Unit division 00    Status of Unit ISSUED    Transfusion Status OK TO TRANSFUSE    Crossmatch Result Compatible   Prepare RBC     Status: None   Collection Time: 12/21/15 11:00 PM  Result Value Ref Range   Order Confirmation ORDER PROCESSED BY BLOOD BANK          Meds:   none   A: 3959w5d with Chronic iron deficiency anemia       S/p blood transfusion, 3 units.        FHT 133 bpm        stable  P: Continue current plan of care      Upcoming tests/treatments:  CBC @ 1415        Plan on discharge if CBC wnl        Ambulatory  Hematology consult with Dr. Candise CheKale  on 01/02/16 at 1330.       MDs will follow  Melanie Hancock CNM, MSN 12/22/2015 11:08 AM

## 2015-12-23 LAB — TYPE AND SCREEN
ABO/RH(D): A POS
Antibody Screen: NEGATIVE
Unit division: 0
Unit division: 0
Unit division: 0

## 2016-01-02 ENCOUNTER — Ambulatory Visit (HOSPITAL_BASED_OUTPATIENT_CLINIC_OR_DEPARTMENT_OTHER): Payer: Medicaid Other | Admitting: Hematology

## 2016-01-02 ENCOUNTER — Telehealth: Payer: Self-pay | Admitting: Hematology

## 2016-01-02 ENCOUNTER — Other Ambulatory Visit (HOSPITAL_BASED_OUTPATIENT_CLINIC_OR_DEPARTMENT_OTHER): Payer: Medicaid Other

## 2016-01-02 VITALS — BP 116/51 | HR 73 | Temp 98.2°F | Resp 18 | Ht 65.0 in | Wt 179.4 lb

## 2016-01-02 DIAGNOSIS — O99013 Anemia complicating pregnancy, third trimester: Secondary | ICD-10-CM

## 2016-01-02 DIAGNOSIS — D649 Anemia, unspecified: Secondary | ICD-10-CM

## 2016-01-02 DIAGNOSIS — D509 Iron deficiency anemia, unspecified: Secondary | ICD-10-CM | POA: Diagnosis not present

## 2016-01-02 DIAGNOSIS — F5089 Other specified eating disorder: Secondary | ICD-10-CM | POA: Insufficient documentation

## 2016-01-02 LAB — CBC & DIFF AND RETIC
BASO%: 0.4 % (ref 0.0–2.0)
Basophils Absolute: 0 10*3/uL (ref 0.0–0.1)
EOS%: 4.2 % (ref 0.0–7.0)
Eosinophils Absolute: 0.3 10*3/uL (ref 0.0–0.5)
HCT: 33.2 % — ABNORMAL LOW (ref 34.8–46.6)
HGB: 10.4 g/dL — ABNORMAL LOW (ref 11.6–15.9)
Immature Retic Fract: 14 % — ABNORMAL HIGH (ref 1.60–10.00)
LYMPH%: 18.7 % (ref 14.0–49.7)
MCH: 22.1 pg — ABNORMAL LOW (ref 25.1–34.0)
MCHC: 31.3 g/dL — ABNORMAL LOW (ref 31.5–36.0)
MCV: 70.6 fL — ABNORMAL LOW (ref 79.5–101.0)
MONO#: 0.5 10*3/uL (ref 0.1–0.9)
MONO%: 6.9 % (ref 0.0–14.0)
NEUT#: 5.1 10*3/uL (ref 1.5–6.5)
NEUT%: 69.8 % (ref 38.4–76.8)
Platelets: 262 10*3/uL (ref 145–400)
RBC: 4.7 10*6/uL (ref 3.70–5.45)
RDW: 21.1 % — ABNORMAL HIGH (ref 11.2–14.5)
Retic %: 0.73 % (ref 0.70–2.10)
Retic Ct Abs: 34.31 10*3/uL (ref 33.70–90.70)
WBC: 7.3 10*3/uL (ref 3.9–10.3)
lymph#: 1.4 10*3/uL (ref 0.9–3.3)

## 2016-01-02 LAB — COMPREHENSIVE METABOLIC PANEL
ALT: <9 U/L (ref 0–55)
AST: 13 U/L (ref 5–34)
Albumin: 2.8 g/dL — ABNORMAL LOW (ref 3.5–5.0)
Alkaline Phosphatase: 105 U/L (ref 40–150)
Anion Gap: 6 mEq/L (ref 3–11)
BUN: 5.5 mg/dL — ABNORMAL LOW (ref 7.0–26.0)
CO2: 24 mEq/L (ref 22–29)
Calcium: 9 mg/dL (ref 8.4–10.4)
Chloride: 106 mEq/L (ref 98–109)
Creatinine: 0.7 mg/dL (ref 0.6–1.1)
EGFR: >90 mL/min/{1.73_m2} (ref >90)
Glucose: 93 mg/dl (ref 70–140)
Potassium: 3.3 mEq/L — ABNORMAL LOW (ref 3.5–5.1)
Sodium: 136 mEq/L (ref 136–145)
Total Bilirubin: 0.6 mg/dL (ref 0.20–1.20)
Total Protein: 7.1 g/dL (ref 6.4–8.3)

## 2016-01-02 NOTE — Telephone Encounter (Signed)
Gave pt apt & avs °

## 2016-01-03 ENCOUNTER — Encounter (HOSPITAL_COMMUNITY): Payer: Self-pay

## 2016-01-03 ENCOUNTER — Telehealth: Payer: Self-pay | Admitting: Hematology

## 2016-01-03 ENCOUNTER — Inpatient Hospital Stay (HOSPITAL_COMMUNITY)
Admission: AD | Admit: 2016-01-03 | Discharge: 2016-01-03 | Disposition: A | Discharge status: Home / Self Care | Payer: Medicaid Other | Source: Ambulatory Visit | Attending: Obstetrics and Gynecology | Admitting: Obstetrics and Gynecology

## 2016-01-03 DIAGNOSIS — O210 Mild hyperemesis gravidarum: Secondary | ICD-10-CM | POA: Insufficient documentation

## 2016-01-03 HISTORY — DX: Postpartum depression: F53.0

## 2016-01-03 HISTORY — DX: Major depressive disorder, single episode, unspecified: F32.9

## 2016-01-03 HISTORY — DX: Other mental disorders complicating the puerperium: O99.345

## 2016-01-03 HISTORY — DX: Depression, unspecified: F32.A

## 2016-01-03 LAB — IRON AND TIBC
%SAT: 24 % (ref 21–57)
Iron: 112 ug/dL (ref 41–142)
TIBC: 466 ug/dL — ABNORMAL HIGH (ref 236–444)
UIBC: 354 ug/dL (ref 120–384)

## 2016-01-03 LAB — FERRITIN: Ferritin: 14 ng/ml (ref 9–269)

## 2016-01-03 LAB — POCT FERN TEST: POCT Fern Test: NEGATIVE

## 2016-01-03 LAB — AMNISURE RUPTURE OF MEMBRANE (ROM) NOT AT ARMC: Amnisure ROM: NEGATIVE

## 2016-01-03 NOTE — MAU Note (Signed)
Pain started last night, in back and abdomen.  Pt slept and pain started back again today.  Possibly leaking fluid felt during contractions.

## 2016-01-03 NOTE — Discharge Instructions (Signed)
Braxton Hicks Contractions °Contractions of the uterus can occur throughout pregnancy. Contractions are not always a sign that you are in labor.  °WHAT ARE BRAXTON HICKS CONTRACTIONS?  °Contractions that occur before labor are called Braxton Hicks contractions, or false labor. Toward the end of pregnancy (32-34 weeks), these contractions can develop more often and may become more forceful. This is not true labor because these contractions do not result in opening (dilatation) and thinning of the cervix. They are sometimes difficult to tell apart from true labor because these contractions can be forceful and people have different pain tolerances. You should not feel embarrassed if you go to the hospital with false labor. Sometimes, the only way to tell if you are in true labor is for your health care provider to look for changes in the cervix. °If there are no prenatal problems or other health problems associated with the pregnancy, it is completely safe to be sent home with false labor and await the onset of true labor. °HOW CAN YOU TELL THE DIFFERENCE BETWEEN TRUE AND FALSE LABOR? °False Labor °· The contractions of false labor are usually shorter and not as hard as those of true labor.   °· The contractions are usually irregular.   °· The contractions are often felt in the front of the lower abdomen and in the groin.   °· The contractions may go away when you walk around or change positions while lying down.   °· The contractions get weaker and are shorter lasting as time goes on.   °· The contractions do not usually become progressively stronger, regular, and closer together as with true labor.   °True Labor °· Contractions in true labor last 30-70 seconds, become very regular, usually become more intense, and increase in frequency.   °· The contractions do not go away with walking.   °· The discomfort is usually felt in the top of the uterus and spreads to the lower abdomen and low back.   °· True labor can be  determined by your health care provider with an exam. This will show that the cervix is dilating and getting thinner.   °WHAT TO REMEMBER °· Keep up with your usual exercises and follow other instructions given by your health care provider.   °· Take medicines as directed by your health care provider.   °· Keep your regular prenatal appointments.   °· Eat and drink lightly if you think you are going into labor.   °· If Braxton Hicks contractions are making you uncomfortable:   °¨ Change your position from lying down or resting to walking, or from walking to resting.   °¨ Sit and rest in a tub of warm water.   °¨ Drink 2-3 glasses of water. Dehydration may cause these contractions.   °¨ Do slow and deep breathing several times an hour.   °WHEN SHOULD I SEEK IMMEDIATE MEDICAL CARE? °Seek immediate medical care if: °· Your contractions become stronger, more regular, and closer together.   °· You have fluid leaking or gushing from your vagina.   °· You have a fever.   °· You pass blood-tinged mucus.   °· You have vaginal bleeding.   °· You have continuous abdominal pain.   °· You have low back pain that you never had before.   °· You feel your baby's head pushing down and causing pelvic pressure.   °· Your baby is not moving as much as it used to.   °  °This information is not intended to replace advice given to you by your health care provider. Make sure you discuss any questions you have with your health care   provider. °  °Document Released: 08/06/2005 Document Revised: 08/11/2013 Document Reviewed: 05/18/2013 °Elsevier Interactive Patient Education ©2016 [employer] ° °

## 2016-01-03 NOTE — Progress Notes (Signed)
Marland Kitchen.    HEMATOLOGY/ONCOLOGY CONSULTATION NOTE  Date of Service: 01/02/2016  Patient Care Team: No Pcp Per Patient as PCP - General (General Practice)  CHIEF COMPLAINTS/PURPOSE OF CONSULTATION:  Severe iron deficiency anemia  HISTORY OF PRESENTING ILLNESS:   Melanie Hancock is a  20 y.o. female who has been referred to us by Dr Chip BoerVicki Latham/Dr Dillard ([facility]) for evaluation and management of iron deficiency anemia during pregnancy.  Patient notes she has a history of chronic anemia that was noted to be significant for the first time about 6 years ago when she was pregnant with her first son. Even prior to that she was to have very heavy periods lasting 5-7 days. She was on oral iron at the time of her first pregnancy and did not require any PRBC transfusions. She notes that she was not really very compliant with iron replacement post pregnancy. She didn't breast feed her first child. She was noted to have significant iron deficiency in 2014 with a hemoglobin of 6.6 requiring PRBC transfusion. She had her second pregnancy in 2015 and delivered her second child in April 2016 with issues of significant iron deficiency anemia as well. She was to get additional PRBCs for hemoglobin in the 6 range but [place] and did not follow-up for her scheduled transfusions. She is currently nearly [redacted] weeks pregnant with an expected date of delivery of 02/02/2016. She presented to her OB GYN team with significant fatigue and a hemoglobin in the 8 range on 12/22/2015 and received 3 units of PRBCs. She has history of no-shows and was referred previously to the clinic earlier in pregnancy but did not show up. She is here today for further evaluation. She notes significant fatigue and some back symptoms characterized by ice craving.  Her posttransfusion labs today show a hemoglobin of 10.4 with hematocrit of 33 MCV of 70.6 with a normal WBC count and platelets. Noted to have normal CMP other than  a mild hypokalemia of 3.3 which is better than a potassium of 2.9. And an albumin of 2.8. She was started on ferrous sulfate 1 tablet by mouth 3 times a day about 2-3 months ago but has been relatively noncompliant with this and an average takes  one tablet day. She notes that it causes abdominal discomfort and significant constipation.  She has had very low ferritin levels in the past but the ferritin level from today is currently pending. We discussed different treatments tried deficiency including continue oral iron which she seems did not feel she can take and alternatively IV iron replacement. We also discussed the timing of the IV iron replacement since this has abating on which iron preparation will be chosen.  Patient notes no overt blood loss. No vaginal bleeding. No blood in her urine or stools. No epistaxis. Patient notes she is significantly fatigued and would like to be treated with iron as soon as possible. No previous IV iron use she has had PRBC transfusions.   MEDICAL HISTORY:  Past Medical History  Diagnosis Date  . Infection     UTI  . UTI (lower urinary tract infection)   . Anxiety   . Anemia   . Pyelonephritis   . Chronic back pain     SURGICAL HISTORY: Past Surgical History  Procedure Laterality Date  . Induced abortion    . Dilation and curettage of uterus      SOCIAL HISTORY: Social History   Social History  . Marital Status: Single  Spouse Name: N/A  . Number of Children: N/A  . Years of Education: N/A   Occupational History  . Not on file.   Social History Main Topics  . Smoking status: Former Games developer  . Smokeless tobacco: Never Used  . Alcohol Use: No  . Drug Use: No  . Sexual Activity: Yes    Birth Control/ Protection: None   Other Topics Concern  . Not on file   Social History Narrative    FAMILY HISTORY: Family History  Problem Relation Age of Onset  . Alcohol abuse Neg Hx   No family history of known sickle cell disease or  other hemoglobin disorders or blood disorders . No family history of bleeding or clotting disorders   no family history of cancers.  ALLERGIES:  has No Known Allergies.  MEDICATIONS:  Current Outpatient Prescriptions  Medication Sig Dispense Refill  . chlorhexidine (PERIDEX) 0.12 % solution SWISH FOR 30 SECONDS TWICE DAILY. DO NOT SWALLOW. DO NOT EAT,DRINK, RINSE FOR 30 MINUTES AFTER USE  0  . ferrous gluconate (FERGON) 324 MG tablet Take 1 tablet by mouth 3 (three) times daily with meals.   2  . Pediatric Multiple Vit-C-FA (FLINSTONES GUMMIES OMEGA-3 DHA PO) Take 2 tablets by mouth daily.     No current facility-administered medications for this visit.  Flexeril when necessary for muscle cramps .  REVIEW OF SYSTEMS:    10 Point review of Systems was done is negative except as noted above.  PHYSICAL EXAMINATION: ECOG PERFORMANCE STATUS: 1 - Symptomatic but completely ambulatory  . Filed Vitals:   01/02/16 1407  BP: 116/51  Pulse: 73  Temp: 98.2 F (36.8 C)  Resp: 18   Filed Weights   01/02/16 1407  Weight: 179 lb 6.4 oz (81.375 kg)   .Body mass index is 29.85 kg/(m^2).  GENERAL:alert, in no acute distress and comfortable SKIN: Pale-appearing, no rashes or significant lesions EYES: normal, conjunctiva are pink and non-injected, sclera clear OROPHARYNX:no exudate, no erythema and lips, buccal mucosa, and tongue normal  NECK: supple, no JVD, thyroid normal size, non-tender, without nodularity LYMPH:  no palpable lymphadenopathy in the cervical, axillary or inguinal LUNGS: clear to auscultation with normal respiratory effort HEART: regular rate & rhythm,  no murmurs and no lower extremity edema ABDOMEN: abdomen Distended with gravid uterusal: no cyanosis of digits and no clubbing  PSYCH: alert & oriented x 3 with fluent speech NEURO: no focal motor/sensory deficits  LABORATORY DATA:  I have reviewed the data as listed  . CBC Latest Ref Rng 01/02/2016 12/22/2015  07/20/2015  WBC 3.9 - 10.3 10e3/uL 7.3 8.1 6.3  Hemoglobin 11.6 - 15.9 g/dL 10.4(L) 9.6(L) 9.2(L)  Hematocrit 34.8 - 46.6 % 33.2(L) 30.8(L) 29.4(L)  Platelets 145 - 400 10e3/uL 262 328 329    . CMP Latest Ref Rng 01/02/2016 07/20/2015 07/19/2015  Glucose 70 - 140 mg/dl 93 88 75  BUN 7.0 - 16.1 mg/dL 0.9(U) 7 8  Creatinine 0.6 - 1.1 mg/dL 0.7 0.45 4.09  Sodium 811 - 145 mEq/L 136 132(L) 134(L)  Potassium 3.5 - 5.1 mEq/L 3.3(L) 2.9(L) 3.3(L)  Chloride 101 - 111 mmol/L - 100(L) 102  CO2 22 - 29 mEq/L 24 23 22   Calcium 8.4 - 10.4 mg/dL 9.0 9.1(Y) 8.9  Total Protein 6.4 - 8.3 g/dL 7.1 - -  Total Bilirubin 0.20 - 1.20 mg/dL 7.82 - -  Alkaline Phos 40 - 150 U/L 105 - -  AST 5 - 34 U/L 13 - -  ALT  0 - 55 U/L <9 - -     RADIOGRAPHIC STUDIES: I have personally reviewed the radiological images as listed and agreed with the findings in the report. No results found.  ASSESSMENT & PLAN:   20 year old African-American female with chronic severe iron deficiency anemia requiring PRBC transfusions in the past currently about [redacted] weeks pregnant   #1 acute on chronic iron deficiency anemia.  Patient likely has chronic iron deficiency due to heavy periods followed by pregnancies and quick succession without adequate iron replacement. Has not been very compliant with oral iron replacement due to GI intolerance. Has also had issues with medical follow-up. Was transfused 3 PRBCs on 12/22/2015 for symptomatic anemia. She is currently nearly [redacted] weeks pregnant with an expected date of delivery of 02/02/2016. Currently her hemoglobin levels are reasonable given her recent transfusions. She continues to note significant weakness and fatigue. Her RBC and ice is a very microcytic suggesting severe iron deficiency. No history of previously diagnosed hemoglobinopathies.  Plan -Ferritin and iron profile pending. Will likely have significant iron deficiency. -Patient received 3 PRBCs on 12/22/2015 that she'll  provide nearly 750 mg of iron after her transfused red cells are recycled. -She will likely have additional blood loss during childbirth and will have increased iron requirements while breast-feeding as well. -She likely has minimal iron stores at this time. -We discussed the options of iron replacement after delivery which could likely be done with IV Feraheme versus IV iron replacement starting immediately which we shall do with IV Venofer since Feraheme as category C and Venofer is category B for pregnancy. -Patient chooses to proceed with IV iron replacement at this time . -We'll replace with IV Venofer 200 mg twice weekly for 4 doses .Will need continuous fetal monitoring and therefore will need to be coordinated with staff at [facility] . -4 weeks after delivery we'll recheck ferritin levels and provide additional IV Feraheme if required . -Patient is agreeable to this plan and informed consent was obtained for IV Venofer .  All of the patients questions were answereto her apparent satisfaction. The patient knows to call the clinic with any problems, questions or concerns.  I spent 55 minutes counseling the patient face to face. The total time spent in the appointment was 60 minutes and more than 50% was on counseling and direct patient cares.    Wyvonnia Lora MD MS AAHIVMS [facility] Hematology/Oncology Physician [facility]  (Office):       [phone] (Work cell):  [phone] (Fax):           [phone]

## 2016-01-03 NOTE — Telephone Encounter (Signed)
left msg for venofer tx on 5/17

## 2016-01-03 NOTE — MAU Note (Signed)
Urine in lab 

## 2016-01-03 NOTE — MAU Note (Signed)
Contractions started yesterday and have also had stabbing sharp pains since Friday.  Also some lower back pain and pressure.  Feels some leaking after contractions, denies VB.

## 2016-01-03 NOTE — Telephone Encounter (Signed)
spoke w/ pt confirmed 5/18 apt for iron

## 2016-01-04 ENCOUNTER — Ambulatory Visit: Payer: Medicaid Other

## 2016-01-04 ENCOUNTER — Telehealth: Payer: Self-pay | Admitting: Hematology

## 2016-01-04 ENCOUNTER — Telehealth: Payer: Self-pay | Admitting: *Deleted

## 2016-01-04 NOTE — Telephone Encounter (Signed)
Patient called and left message to cancel her apapt for tomorrow. She stated "I have another appt in the morning. Please call me back." I tried calling her back and left a message.

## 2016-01-04 NOTE — Telephone Encounter (Signed)
pt left voicemail wanting time & date of appt-cld and left pt a message and adv of time & date of 6/12 appt @1 

## 2016-01-04 NOTE — Telephone Encounter (Signed)
pt cld left voicemail ot get appt time foer 5/18-cld and spk to pt and adv 8:00 am 5/18-pt wanted to r/s-trans to MW to r/s

## 2016-01-05 ENCOUNTER — Ambulatory Visit: Payer: Medicaid Other

## 2016-01-05 LAB — OB RESULTS CONSOLE GBS: GBS: NEGATIVE

## 2016-01-11 ENCOUNTER — Other Ambulatory Visit: Payer: Self-pay | Admitting: *Deleted

## 2016-01-11 ENCOUNTER — Telehealth: Payer: Self-pay | Admitting: Hematology

## 2016-01-11 NOTE — Telephone Encounter (Signed)
called pt on 5/24 @ 4:50pm and offered pt an appt for may 26 the pt said she needed to find a ride and would call back tommorrow. After may 26 the next apt available is  june 2.

## 2016-01-12 ENCOUNTER — Telehealth: Payer: Self-pay | Admitting: *Deleted

## 2016-01-12 NOTE — Telephone Encounter (Signed)
Per scheduler, we are canceling the appt.

## 2016-01-12 NOTE — Telephone Encounter (Signed)
Per staff message and POF I have scheduled appts. Advised scheduler of appts. JMW  

## 2016-01-19 ENCOUNTER — Inpatient Hospital Stay (HOSPITAL_COMMUNITY)
Admission: AD | Admit: 2016-01-19 | Discharge: 2016-01-19 | Disposition: A | Discharge status: Home / Self Care | Payer: Medicaid Other | Source: Ambulatory Visit | Attending: Obstetrics & Gynecology | Admitting: Obstetrics & Gynecology

## 2016-01-19 ENCOUNTER — Encounter (HOSPITAL_COMMUNITY): Payer: Self-pay

## 2016-01-19 DIAGNOSIS — R102 Pelvic and perineal pain: Secondary | ICD-10-CM | POA: Insufficient documentation

## 2016-01-19 DIAGNOSIS — Z3A37 37 weeks gestation of pregnancy: Secondary | ICD-10-CM | POA: Insufficient documentation

## 2016-01-19 DIAGNOSIS — O479 False labor, unspecified: Secondary | ICD-10-CM

## 2016-01-19 DIAGNOSIS — O26893 Other specified pregnancy related conditions, third trimester: Secondary | ICD-10-CM | POA: Insufficient documentation

## 2016-01-19 DIAGNOSIS — O471 False labor at or after 37 completed weeks of gestation: Secondary | ICD-10-CM | POA: Diagnosis not present

## 2016-01-19 DIAGNOSIS — R079 Chest pain, unspecified: Secondary | ICD-10-CM | POA: Insufficient documentation

## 2016-01-19 DIAGNOSIS — O99013 Anemia complicating pregnancy, third trimester: Secondary | ICD-10-CM | POA: Diagnosis not present

## 2016-01-19 DIAGNOSIS — Z87891 Personal history of nicotine dependence: Secondary | ICD-10-CM | POA: Insufficient documentation

## 2016-01-19 DIAGNOSIS — R0602 Shortness of breath: Secondary | ICD-10-CM | POA: Diagnosis present

## 2016-01-19 DIAGNOSIS — R200 Anesthesia of skin: Secondary | ICD-10-CM | POA: Insufficient documentation

## 2016-01-19 DIAGNOSIS — F419 Anxiety disorder, unspecified: Secondary | ICD-10-CM

## 2016-01-19 DIAGNOSIS — Z87440 Personal history of urinary (tract) infections: Secondary | ICD-10-CM | POA: Diagnosis not present

## 2016-01-19 DIAGNOSIS — D509 Iron deficiency anemia, unspecified: Secondary | ICD-10-CM | POA: Insufficient documentation

## 2016-01-19 LAB — WET PREP, GENITAL
Clue Cells Wet Prep HPF POC: NONE SEEN
Sperm: NONE SEEN
Trich, Wet Prep: NONE SEEN
Yeast Wet Prep HPF POC: NONE SEEN

## 2016-01-19 LAB — CBC
HCT: 37.3 % (ref 36.0–46.0)
Hemoglobin: 11.9 g/dL — ABNORMAL LOW (ref 12.0–15.0)
MCH: 22.7 pg — ABNORMAL LOW (ref 26.0–34.0)
MCHC: 31.9 g/dL (ref 30.0–36.0)
MCV: 71.2 fL — ABNORMAL LOW (ref 78.0–100.0)
Platelets: 265 10*3/uL (ref 150–400)
RBC: 5.24 MIL/uL — ABNORMAL HIGH (ref 3.87–5.11)
RDW: 24.3 % — ABNORMAL HIGH (ref 11.5–15.5)
WBC: 5.5 10*3/uL (ref 4.0–10.5)

## 2016-01-19 LAB — URINALYSIS, ROUTINE W REFLEX MICROSCOPIC
Bilirubin Urine: NEGATIVE
Glucose, UA: NEGATIVE mg/dL
Hgb urine dipstick: NEGATIVE
Ketones, ur: NEGATIVE mg/dL
Leukocytes, UA: NEGATIVE
Nitrite: NEGATIVE
Protein, ur: NEGATIVE mg/dL
Specific Gravity, Urine: 1.01 (ref 1.005–1.030)
pH: 7.5 (ref 5.0–8.0)

## 2016-01-19 MED ORDER — HYDROXYZINE HCL 10 MG PO TABS: 10.0000 mg | ORAL_TABLET | Freq: Three times a day (TID) | ORAL | Status: DC | PRN

## 2016-01-19 MED ORDER — HYDROXYZINE HCL 25 MG PO TABS
25.0000 mg | ORAL_TABLET | Freq: Once | ORAL | Status: AC
  Administered 2016-01-19: 25 mg via ORAL
  Filled 2016-01-19: qty 1

## 2016-01-19 NOTE — MAU Note (Signed)
Assumed care of patient.

## 2016-01-19 NOTE — Discharge Instructions (Signed)
Panic Attacks °Panic attacks are sudden, short-lived surges of severe anxiety, fear, or discomfort. They may occur for no reason when you are relaxed, when you are anxious, or when you are sleeping. Panic attacks may occur for a number of reasons:  °· Healthy people occasionally have panic attacks in extreme, life-threatening situations, such as war or natural disasters. Normal anxiety is a protective mechanism of the body that helps us react to danger (fight or flight response). °· Panic attacks are often seen with anxiety disorders, such as panic disorder, social anxiety disorder, generalized anxiety disorder, and phobias. Anxiety disorders cause excessive or uncontrollable anxiety. They may interfere with your relationships or other life activities. °· Panic attacks are sometimes seen with other mental illnesses, such as depression and posttraumatic stress disorder. °· Certain medical conditions, prescription medicines, and drugs of abuse can cause panic attacks. °SYMPTOMS  °Panic attacks start suddenly, peak within 20 minutes, and are accompanied by four or more of the following symptoms: °· Pounding heart or fast heart rate (palpitations). °· Sweating. °· Trembling or shaking. °· Shortness of breath or feeling smothered. °· Feeling choked. °· Chest pain or discomfort. °· Nausea or strange feeling in your stomach. °· Dizziness, light-headedness, or feeling like you will faint. °· Chills or hot flushes. °· Numbness or tingling in your lips or hands and feet. °· Feeling that things are not real or feeling that you are not yourself. °· Fear of losing control or going crazy. °· Fear of dying. °Some of these symptoms can mimic serious medical conditions. For example, you may think you are having a heart attack. Although panic attacks can be very scary, they are not life threatening. °DIAGNOSIS  °Panic attacks are diagnosed through an assessment by your health care provider. Your health care provider will ask  questions about your symptoms, such as where and when they occurred. Your health care provider will also ask about your medical history and use of alcohol and drugs, including prescription medicines. Your health care provider may order blood tests or other studies to rule out a serious medical condition. Your health care provider may refer you to a mental health professional for further evaluation. °TREATMENT  °· Most healthy people who have one or two panic attacks in an extreme, life-threatening situation will not require treatment. °· The treatment for panic attacks associated with anxiety disorders or other mental illness typically involves counseling with a mental health professional, medicine, or a combination of both. Your health care provider will help determine what treatment is best for you. °· Panic attacks due to physical illness usually go away with treatment of the illness. If prescription medicine is causing panic attacks, talk with your health care provider about stopping the medicine, decreasing the dose, or substituting another medicine. °· Panic attacks due to alcohol or drug abuse go away with abstinence. Some adults need professional help in order to stop drinking or using drugs. °HOME CARE INSTRUCTIONS  °· Take all medicines as directed by your health care provider.   °· Schedule and attend follow-up visits as directed by your health care provider. It is important to keep all your appointments. °SEEK MEDICAL CARE IF: °· You are not able to take your medicines as prescribed. °· Your symptoms do not improve or get worse. °SEEK IMMEDIATE MEDICAL CARE IF:  °· You experience panic attack symptoms that are different than your usual symptoms. °· You have serious thoughts about hurting yourself or others. °· You are taking medicine for panic attacks and   have a serious side effect. MAKE SURE YOU:  Understand these instructions.  Will watch your condition.  Will get help right away if you are not  doing well or get worse.   This information is not intended to replace advice given to you by your health care provider. Make sure you discuss any questions you have with your health care provider.   Document Released: 08/06/2005 Document Revised: 08/11/2013 Document Reviewed: 03/20/2013 Elsevier Interactive Patient Education 2016 Elsevier Inc.    [employer] of the uterus can occur throughout pregnancy. Contractions are not always a sign that you are in labor.  WHAT ARE BRAXTON HICKS CONTRACTIONS?  Contractions that occur before labor are called Braxton Hicks contractions, or false labor. Toward the end of pregnancy (32-34 weeks), these contractions can develop more often and may become more forceful. This is not true labor because these contractions do not result in opening (dilatation) and thinning of the cervix. They are sometimes difficult to tell apart from true labor because these contractions can be forceful and people have different pain tolerances. You should not feel embarrassed if you go to the hospital with false labor. Sometimes, the only way to tell if you are in true labor is for your health care provider to look for changes in the cervix. If there are no prenatal problems or other health problems associated with the pregnancy, it is completely safe to be sent home with false labor and await the onset of true labor. HOW CAN YOU TELL THE DIFFERENCE BETWEEN TRUE AND FALSE LABOR? False Labor  The contractions of false labor are usually shorter and not as hard as those of true labor.   The contractions are usually irregular.   The contractions are often felt in the front of the lower abdomen and in the groin.   The contractions may go away when you walk around or change positions while lying down.   The contractions get weaker and are shorter lasting as time goes on.   The contractions do not usually become progressively stronger, regular, and  closer together as with true labor.  True Labor  Contractions in true labor last 30-70 seconds, become very regular, usually become more intense, and increase in frequency.   The contractions do not go away with walking.   The discomfort is usually felt in the top of the uterus and spreads to the lower abdomen and low back.   True labor can be determined by your health care provider with an exam. This will show that the cervix is dilating and getting thinner.  WHAT TO REMEMBER  Keep up with your usual exercises and follow other instructions given by your health care provider.   Take medicines as directed by your health care provider.   Keep your regular prenatal appointments.   Eat and drink lightly if you think you are going into labor.   If Braxton Hicks contractions are making you uncomfortable:   Change your position from lying down or resting to walking, or from walking to resting.   Sit and rest in a tub of warm water.   Drink 2-3 glasses of water. Dehydration may cause these contractions.   Do slow and deep breathing several times an hour.  WHEN SHOULD I SEEK IMMEDIATE MEDICAL CARE? Seek immediate medical care if:  Your contractions become stronger, more regular, and closer together.   You have fluid leaking or gushing from your vagina.   You have a fever.   You  pass blood-tinged mucus.   You have vaginal bleeding.   You have continuous abdominal pain.   You have low back pain that you never had before.   You feel your baby's head pushing down and causing pelvic pressure.   Your baby is not moving as much as it used to.    This information is not intended to replace advice given to you by your health care provider. Make sure you discuss any questions you have with your health care provider.   Document Released: 08/06/2005 Document Revised: 08/11/2013 Document Reviewed: 05/18/2013 Elsevier Interactive Patient Education [employer]. Third Trimester of Pregnancy The third trimester is from week 29 through week 42, months 7 through 9. This trimester is when your unborn baby (fetus) is growing very fast. At the end of the ninth month, the unborn baby is about 20 inches in length. It weighs about 6-10 pounds.  HOME CARE   Avoid all smoking, herbs, and alcohol. Avoid drugs not approved by your doctor.  Do not use any tobacco products, including cigarettes, chewing tobacco, and electronic cigarettes. If you need help quitting, ask your doctor. You may get counseling or other support to help you quit.  Only take medicine as told by your doctor. Some medicines are safe and some are not during pregnancy.  Exercise only as told by your doctor. Stop exercising if you start having cramps.  Eat regular, healthy meals.  Wear a good support bra if your breasts are tender.  Do not use hot tubs, steam rooms, or saunas.  Wear your seat belt when driving.  Avoid raw meat, uncooked cheese, and liter boxes and soil used by cats.  Take your prenatal vitamins.  Take 1500-2000 milligrams of calcium daily starting at the 20th week of pregnancy until you deliver your baby.  Try taking medicine that helps you poop (stool softener) as needed, and if your doctor approves. Eat more fiber by eating fresh fruit, vegetables, and whole grains. Drink enough fluids to keep your pee (urine) clear or pale yellow.  Take warm water baths (sitz baths) to soothe pain or discomfort caused by hemorrhoids. Use hemorrhoid cream if your doctor approves.  If you have puffy, bulging veins (varicose veins), wear support hose. Raise (elevate) your feet for 15 minutes, 3-4 times a day. Limit salt in your diet.  Avoid heavy lifting, wear low heels, and sit up straight.  Rest with your legs raised if you have leg cramps or low back pain.  Visit your dentist if you have not gone during your pregnancy. Use a soft toothbrush to brush your teeth. Be gentle  when you floss.  You can have sex (intercourse) unless your doctor tells you not to.  Do not travel far distances unless you must. Only do so with your doctor's approval.  Take prenatal classes.  Practice driving to the hospital.  Pack your hospital bag.  Prepare the baby's room.  Go to your doctor visits. GET HELP IF:  You are not sure if you are in labor or if your water has broken.  You are dizzy.  You have mild cramps or pressure in your lower belly (abdominal).  You have a nagging pain in your belly area.  You continue to feel sick to your stomach (nauseous), throw up (vomit), or have watery poop (diarrhea).  You have bad smelling fluid coming from your vagina.  You have pain with peeing (urination). GET HELP RIGHT AWAY IF:   You have a fever.  You are leaking fluid from your vagina.  You are spotting or bleeding from your vagina.  You have severe belly cramping or pain.  You lose or gain weight rapidly.  You have trouble catching your breath and have chest pain.  You notice sudden or extreme puffiness (swelling) of your face, hands, ankles, feet, or legs.  You have not felt the baby move in over an hour.  You have severe headaches that do not go away with medicine.  You have vision changes.   This information is not intended to replace advice given to you by your health care provider. Make sure you discuss any questions you have with your health care provider.   Document Released: 10/31/2009 Document Revised: 08/27/2014 Document Reviewed: 10/07/2012 Elsevier Interactive Patient Education [employer]2016 Elsevier Inc.

## 2016-01-19 NOTE — MAU Provider Note (Signed)
Christensen, Melanie Hancock  Is a 20 yo, G5P2022, at 37.5 wks presenting to [facility]U unannounced for a variety of complaints of SOB and chest pain when she woke up around 2pm.  Also c/o intermittent numbness in her right leg, sharp abdominal pains bilaterally, difficulty with urination an occasional leaking of vaginal fluid.    History     Patient Active Problem List   Diagnosis Date Noted  . Iron deficiency anemia 01/02/2016  . 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 Chronic anemia 12/22/2015  . STD exposure 07/29/2015  . Hyperemesis 07/17/2015  . Anxiety 03/28/2014  . Chronic back pain 03/28/2014  . Anemia--Hgb 8.8 on 06/14/15 05/26/2013  . Hx pyelonephritis 2014 05/26/2013    Chief Complaint  Patient presents with  . Chest Pain  . Shortness of Breath  . Numbness   HPI  OB History    Gravida Para Term Preterm AB TAB SAB Ectopic Multiple Living   0 2      Past Medical History  Diagnosis Date  . Infection     UTI  . UTI (lower urinary tract infection)   . Anxiety   . Anemia   . Pyelonephritis   . Chronic back pain   . Depression   . Post partum depression     Past Surgical History  Procedure Laterality Date  . Induced abortion    . Dilation and curettage of uterus    . Wisdom tooth extraction      Family History  Problem Relation Age of Onset  . Alcohol abuse Neg Hx     Social History  Substance Use Topics  . Smoking status: Former Games developer  . Smokeless tobacco: Never Used  . Alcohol Use: No    Allergies: No Known Allergies  Prescriptions prior to admission  Medication Sig Dispense Refill Last Dose  . cyclobenzaprine (FLEXERIL) 10 MG tablet Take 10 mg by mouth 3 (three) times daily as needed for muscle spasms.   Past Week at Unknown time  . ferrous gluconate (FERGON) 324 MG tablet Take 2 tablets by mouth daily.   2 01/19/2016 at Unknown time  . valACYclovir (VALTREX) 1000 MG tablet Take 1 tablet by mouth daily.  0 01/18/2016 at Unknown time  . zolpidem (AMBIEN) 10  MG tablet Take 10 mg by mouth at bedtime as needed for sleep.   Past Week at Unknown time    ROS Physical Exam   Blood pressure 106/58, pulse 83, temperature 97.9 F (36.6 C), temperature source Oral, resp. rate 20, height  (1.651 m), weight 82.555 kg (182 lb), last menstrual period 04/23/2015, SpO2 99 %, not currently breastfeeding.  Filed Vitals:   01/19/16 1809 01/19/16 1821 01/19/16 1908 01/19/16 1909  BP: 115/51  106/58   Pulse: 76  86 83  Temp: 97.9 F (36.6 C)     TempSrc: Oral     Resp: 20     Height:   (1.651 m)    Weight:  82.555 kg (182 lb)    SpO2: 100%  99%      Results for orders placed or performed [facility] (from the past 24 hour(s))  CBC     Status: Abnormal   Collection Time: 01/19/16  6:47 PM  Result Value Ref Range   WBC 5.5 4.0 - 10.5 K/uL   RBC 5.24 (H) 3.87 - 5.11 MIL/uL   Hemoglobin 11.9 (L) 12.0 - 15.0 g/dL   HCT  37.3 36.0 - 46.0 %   MCV 71.2 (L) 78.0 - 100.0 fL   MCH 22.7 (L) 26.0 - 34.0 pg   MCHC 31.9 30.0 - 36.0 g/dL   RDW 69.624.3 (H) 29.511.5 - 28.415.5 %   Platelets 265 150 - 400 K/uL  Urinalysis, Routine w reflex microscopic ([facility])     Status: None   Collection Time: 01/19/16  7:00 PM  Result Value Ref Range   Color, Urine YELLOW YELLOW   APPearance CLEAR CLEAR   Specific Gravity, Urine 1.010 1.005 - 1.030   pH 7.5 5.0 - 8.0   Glucose, UA NEGATIVE NEGATIVE mg/dL   Hgb urine dipstick NEGATIVE NEGATIVE   Bilirubin Urine NEGATIVE NEGATIVE   Ketones, ur NEGATIVE NEGATIVE mg/dL   Protein, ur NEGATIVE NEGATIVE mg/dL   Nitrite NEGATIVE NEGATIVE   Leukocytes, UA NEGATIVE NEGATIVE    FHT: Cat 1, 140 bpm, moderate variability, +accels, no decels UC :  Irregular    Dilation: 1 Effacement (%): 50 Station: -2 Presentation: Vertex Exam by:: Alphonzo Severanceachel Tavonte Seybold, CNM  EKG: normal EKG, normal sinus rhythm. Non specific T wave abnormality   Physical Exam  Constitutional: She is oriented to person, place, and  time. She appears well-developed and well-nourished. No distress.  HENT:  Head: Normocephalic.  Eyes: Pupils are equal, round, and reactive to light.  Neck: Normal range of motion.  Cardiovascular: Normal rate and regular rhythm.   Respiratory: Effort normal and breath sounds normal.  GI: Soft.  Genitourinary: Vagina normal. Cervix exhibits no motion tenderness, no discharge and no friability. No tenderness or bleeding in the vagina. No vaginal discharge found.  SVE:   1/50/-2 Membranes Intact Negative pooling Negative Fern  Musculoskeletal: Normal range of motion.  Neurological: She is alert and oriented to person, place, and time.  Skin: Skin is warm and dry.  Psychiatric: She has a normal mood and affect. Her behavior is normal.    ED Course  Assessment: IUP 37.5 wks Possible Anxiety/ Panic Attack Fern negative  Rare contractions Common discomforts of pregnancy: Deberah PeltonBraxton Hicks, Round ligament pain SVE:  1/50-2 Cat 1 FT    Plan: Vistaril, Now DC home in stable condition Discussed common discomforts of pregnancy Rx Vistaril 10 mg, PRN  ( not to be taken with Ambien) Kick counts and  Labor precautions discussed Keep Scheduled ROB Call PRN  Alphonzo Severanceachel Artin Mceuen CNM, MSN 01/19/2016 8:36 PM

## 2016-01-19 NOTE — MAU Note (Addendum)
Pt reprots she has been having chest pai and pressure and SOB since she woke up around 2pm. C/o intermittent numbness in her right leg as well., and shrp pains in her abd.Pt also c/o difficulty urinating

## 2016-01-20 ENCOUNTER — Ambulatory Visit: Payer: Medicaid Other

## 2016-01-22 ENCOUNTER — Encounter (HOSPITAL_COMMUNITY): Payer: Self-pay | Admitting: *Deleted

## 2016-01-22 ENCOUNTER — Inpatient Hospital Stay (HOSPITAL_COMMUNITY)
Admission: AD | Admit: 2016-01-22 | Discharge: 2016-01-22 | Disposition: A | Discharge status: Home / Self Care | Payer: Medicaid Other | Source: Ambulatory Visit | Attending: Obstetrics & Gynecology | Admitting: Obstetrics & Gynecology

## 2016-01-22 DIAGNOSIS — Z8659 Personal history of other mental and behavioral disorders: Secondary | ICD-10-CM

## 2016-01-22 DIAGNOSIS — R1030 Lower abdominal pain, unspecified: Secondary | ICD-10-CM | POA: Insufficient documentation

## 2016-01-22 DIAGNOSIS — R0602 Shortness of breath: Secondary | ICD-10-CM | POA: Insufficient documentation

## 2016-01-22 DIAGNOSIS — O26893 Other specified pregnancy related conditions, third trimester: Secondary | ICD-10-CM | POA: Insufficient documentation

## 2016-01-22 DIAGNOSIS — O99013 Anemia complicating pregnancy, third trimester: Secondary | ICD-10-CM | POA: Diagnosis not present

## 2016-01-22 DIAGNOSIS — O99891 Other specified diseases and conditions complicating pregnancy: Secondary | ICD-10-CM

## 2016-01-22 DIAGNOSIS — D509 Iron deficiency anemia, unspecified: Secondary | ICD-10-CM | POA: Insufficient documentation

## 2016-01-22 DIAGNOSIS — O26899 Other specified pregnancy related conditions, unspecified trimester: Secondary | ICD-10-CM

## 2016-01-22 DIAGNOSIS — O9989 Other specified diseases and conditions complicating pregnancy, childbirth and the puerperium: Secondary | ICD-10-CM

## 2016-01-22 DIAGNOSIS — Z87891 Personal history of nicotine dependence: Secondary | ICD-10-CM | POA: Diagnosis not present

## 2016-01-22 DIAGNOSIS — Z3A38 38 weeks gestation of pregnancy: Secondary | ICD-10-CM | POA: Diagnosis not present

## 2016-01-22 DIAGNOSIS — R109 Unspecified abdominal pain: Secondary | ICD-10-CM

## 2016-01-22 LAB — URINE MICROSCOPIC-ADD ON: RBC / HPF: NONE SEEN RBC/hpf (ref 0–5)

## 2016-01-22 LAB — URINALYSIS, ROUTINE W REFLEX MICROSCOPIC
Bilirubin Urine: NEGATIVE
Glucose, UA: NEGATIVE mg/dL
Hgb urine dipstick: NEGATIVE
Ketones, ur: NEGATIVE mg/dL
Nitrite: NEGATIVE
Protein, ur: NEGATIVE mg/dL
Specific Gravity, Urine: <1.005 — ABNORMAL LOW (ref 1.005–1.030)
pH: 6.5 (ref 5.0–8.0)

## 2016-01-22 MED ORDER — MORPHINE SULFATE (PF) 10 MG/ML IV SOLN
10.0000 mg | Freq: Once | INTRAVENOUS | Status: AC
Start: 2016-01-22 — End: 2016-01-22
  Administered 2016-01-22: 10 mg via INTRAMUSCULAR
  Filled 2016-01-22: qty 1

## 2016-01-22 MED ORDER — ONDANSETRON 8 MG PO TBDP
8.0000 mg | ORAL_TABLET | Freq: Once | ORAL | Status: AC
  Administered 2016-01-22: 8 mg via ORAL
  Filled 2016-01-22: qty 1

## 2016-01-22 NOTE — Discharge Instructions (Signed)
Abdominal Pain During Pregnancy °Belly (abdominal) pain is common during pregnancy. Most of the time, it is not a serious problem. Other times, it can be a sign that something is wrong with the pregnancy. Always tell your doctor if you have belly pain. °HOME CARE °Monitor your belly pain for any changes. The following actions may help you feel better: °· Do not have sex (intercourse) or put anything in your vagina until you feel better. °· Rest until your pain stops. °· Drink clear fluids if you feel sick to your stomach (nauseous). Do not eat solid food until you feel better. °· Only take medicine as told by your doctor. °· Keep all doctor visits as told. °GET HELP RIGHT AWAY IF:  °· You are bleeding, leaking fluid, or pieces of tissue come out of your vagina. °· You have more pain or cramping. °· You keep throwing up (vomiting). °· You have pain when you pee (urinate) or have blood in your pee. °· You have a fever. °· You do not feel your baby moving as much. °· You feel very weak or feel like passing out. °· You have trouble breathing, with or without belly pain. °· You have a very bad headache and belly pain. °· You have fluid leaking from your vagina and belly pain. °· You keep having watery poop (diarrhea). °· Your belly pain does not go away after resting, or the pain gets worse. °MAKE SURE YOU:  °· Understand these instructions. °· Will watch your condition. °· Will get help right away if you are not doing well or get worse. °  °This information is not intended to replace advice given to you by your health care provider. Make sure you discuss any questions you have with your health care provider. °  °Document Released: 07/25/2009 Document Revised: 04/08/2013 Document Reviewed: 03/05/2013 °Elsevier Interactive Patient Education ©2016 [employer] ° °Back Pain in Pregnancy °Back pain during pregnancy is common. It happens in about half of all pregnancies. It is important for you and your baby that you remain  active during your pregnancy. If you feel that back pain is not allowing you to remain active or sleep well, it is time to see your caregiver. Back pain may be caused by several factors related to changes during your pregnancy. Fortunately, unless you had trouble with your back before your pregnancy, the pain is likely to get better after you deliver. °Low back pain usually occurs between the fifth and seventh months of pregnancy. It can, however, happen in the first couple months. Factors that increase the risk of back problems include:  °· Previous back problems. °· Injury to your back. °· Having twins or multiple births. °· A chronic cough. °· Stress. °· Job-related repetitive motions. °· Muscle or spinal disease in the back. °· Family history of back problems, ruptured (herniated) discs, or osteoporosis. °· Depression, anxiety, and panic attacks. °CAUSES  °· When you are pregnant, your body produces a hormone called relaxin. This hormone makes the ligaments connecting the low back and pubic bones more flexible. This flexibility allows the baby to be delivered more easily. When your ligaments are loose, your muscles need to work harder to support your back. Soreness in your back can come from tired muscles. Soreness can also come from back tissues that are irritated since they are receiving less support. °· As the baby grows, it puts pressure on the nerves and blood vessels in your pelvis. This can cause back pain. °· As the baby grows   and gets heavier during pregnancy, the uterus pushes the stomach muscles forward and changes your center of gravity. This makes your back muscles work harder to maintain good posture. °SYMPTOMS  °Lumbar pain during pregnancy °Lumbar pain during pregnancy usually occurs at or above the waist in the center of the back. There may be pain and numbness that radiates into your leg or foot. This is similar to low back pain experienced by non-pregnant women. It usually increases with  sitting for long periods of time, standing, or repetitive lifting. Tenderness may also be present in the muscles along your upper back. °Posterior pelvic pain during pregnancy °Pain in the back of the pelvis is more common than lumbar pain in pregnancy. It is a deep pain felt in your side at the waistline, or across the tailbone (sacrum), or in both places. You may have pain on one or both sides. This pain can also go into the buttocks and backs of the upper thighs. Pubic and groin pain may also be present. The pain does not quickly resolve with rest, and morning stiffness may also be present. °Pelvic pain during pregnancy can be brought on by most activities. A high level of fitness before and during pregnancy may or may not prevent this problem. Labor pain is usually 1 to 2 minutes apart, lasts for about 1 minute, and involves a bearing down feeling or pressure in your pelvis. However, if you are at term with the pregnancy, constant low back pain can be the beginning of early labor, and you should be aware of this. °DIAGNOSIS  °X-rays of the back should not be done during the first 12 to 14 weeks of the pregnancy and only when absolutely necessary during the rest of the pregnancy. MRIs do not give off radiation and are safe during pregnancy. MRIs also should only be done when absolutely necessary. °HOME CARE INSTRUCTIONS °· Exercise as directed by your caregiver. Exercise is the most effective way to prevent or manage back pain. If you have a back problem, it is especially important to avoid sports that require sudden body movements. Swimming and walking are great activities. °· Do not stand in one place for long periods of time. °· Do not wear high heels. °· Sit in chairs with good posture. Use a pillow on your lower back if necessary. Make sure your head rests over your shoulders and is not hanging forward. °· Try sleeping on your side, preferably the left side, with a pillow or two between your legs. If you are  sore after a night's rest, your bed may be too soft. Try placing a board between your mattress and box spring. °· Listen to your body when lifting. If you are experiencing pain, ask for help or try bending your knees more so you can use your leg muscles rather than your back muscles. Squat down when picking up something from the floor. Do not bend over. °· Eat a healthy diet. Try to gain weight within your caregiver's recommendations. °· Use heat or cold packs 3 to 4 times a day for 15 minutes to help with the pain. °· Only take over-the-counter or prescription medicines for pain, discomfort, or fever as directed by your caregiver. °Sudden (acute) back pain °· Use bed rest for only the most extreme, acute episodes of back pain. Prolonged bed rest over 48 hours will aggravate your condition. °· Ice is very effective for acute conditions. °¨ Put ice in a plastic bag. °¨ Place a towel between your skin and the bag. °¨   Leave the ice on for 10 to 20 minutes every 2 hours, or as needed. °· Using heat packs for 30 minutes prior to activities is also helpful. °Continued back pain °See your caregiver if you have continued problems. Your caregiver can help or refer you for appropriate physical therapy. With conditioning, most back problems can be avoided. Sometimes, a more serious issue may be the cause of back pain. You should be seen right away if new problems seem to be developing. Your caregiver may recommend: °· A maternity girdle. °· An elastic sling. °· A back brace. °· A massage therapist or acupuncture. °SEEK MEDICAL CARE IF:  °· You are not able to do most of your daily activities, even when taking the pain medicine you were given. °· You need a referral to a physical therapist or chiropractor. °· You want to try acupuncture. °SEEK IMMEDIATE MEDICAL CARE IF: °· You develop numbness, tingling, weakness, or problems with the use of your arms or legs. °· You develop severe back pain that is no longer relieved with  medicines. °· You have a sudden change in bowel or bladder control. °· You have increasing pain in other areas of the body. °· You develop shortness of breath, dizziness, or fainting. °· You develop nausea, vomiting, or sweating. °· You have back pain which is similar to labor pains. °· You have back pain along with your water breaking or vaginal bleeding. °· You have back pain or numbness that travels down your leg. °· Your back pain developed after you fell. °· You develop pain on one side of your back. You may have a kidney stone. °· You see blood in your urine. You may have a bladder infection or kidney stone. °· You have back pain with blisters. You may have shingles. °Back pain is fairly common during pregnancy but should not be accepted as just part of the process. Back pain should always be treated as soon as possible. This will make your pregnancy as pleasant as possible. °  °This information is not intended to replace advice given to you by your health care provider. Make sure you discuss any questions you have with your health care provider. °  °Document Released: 11/14/2005 Document Revised: 10/29/2011 Document Reviewed: 12/26/2010 °Elsevier Interactive Patient Education ©2016 [employer] Inc. ° °

## 2016-01-22 NOTE — MAU Note (Signed)
Pt reports she is in a lot of pain and she can't breathe. States the pain is in her lower stomach, pelvis, butt, and back. Pain started yesterday. States she has had trouble breathing for the last 3 days.states she has been dizzy today.

## 2016-01-22 NOTE — MAU Provider Note (Signed)
History  Melanie Hancock is a 20 yo [id number] @ 38.3 wks who presents to MAU w/ c/o worsening SOB, and lower abdominal, butt and back pain x 1 day. +FM. Denies cough, CP, wheezing, fever, hemoptysis, VB or LOF.    Patient Active Problem List   Diagnosis Date Noted  . History of postpartum depression, currently pregnant 01/23/2016  . Iron deficiency anemia 01/02/2016  . 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 Anxiety 03/28/2014  . Chronic back pain 03/28/2014  . Hx pyelonephritis 2014 05/26/2013    No chief complaint on file.  HPI  As above  OB History    Gravida Para Term Preterm AB TAB SAB Ectopic Multiple Living   0 2      Past Medical History  Diagnosis Date  . Infection     UTI  . UTI (lower urinary tract infection)   . Anxiety   . Anemia   . Pyelonephritis   . Chronic back pain   . Depression   . Post partum depression     Past Surgical History  Procedure Laterality Date  . Induced abortion    . Dilation and curettage of uterus    . Wisdom tooth extraction      Family History  Problem Relation Age of Onset  . Alcohol abuse Neg Hx     Social History  Substance Use Topics  . Smoking status: Former Smoker    Quit date: 01/21/2014  . Smokeless tobacco: Never Used  . Alcohol Use: No    Allergies: No Known Allergies  No prescriptions prior to admission    ROS  As per HPI Physical Exam   Results for orders placed or performed during the hospital encounter of 01/22/16 (from the past 24 hour(s))  Urinalysis, Routine w reflex microscopic (not at [facility])     Status: Abnormal   Collection Time: 01/22/16  7:10 PM  Result Value Ref Range   Color, Urine YELLOW YELLOW   APPearance CLEAR CLEAR   Specific Gravity, Urine <1.005 (L) 1.005 - 1.030   pH 6.5 5.0 - 8.0   Glucose, UA NEGATIVE NEGATIVE mg/dL   Hgb urine dipstick NEGATIVE NEGATIVE   Bilirubin Urine NEGATIVE NEGATIVE   Ketones, ur NEGATIVE NEGATIVE mg/dL   Protein, ur NEGATIVE NEGATIVE mg/dL    Nitrite NEGATIVE NEGATIVE   Leukocytes, UA SMALL (A) NEGATIVE  Urine microscopic-add on     Status: Abnormal   Collection Time: 01/22/16  7:10 PM  Result Value Ref Range   Squamous Epithelial / LPF 0-5 (A) NONE SEEN   WBC, UA 0-5 0 - 5 WBC/hpf   RBC / HPF NONE SEEN 0 - 5 RBC/hpf   Bacteria, UA FEW (A) NONE SEEN   Blood pressure 118/77, pulse 74, temperature 98.1 F (36.7 C), temperature source Oral, resp. rate 16, height  (1.651 m), weight 83.915 kg (185 lb), last menstrual period 04/23/2015, SpO2 100 %, not currently breastfeeding.    Physical Exam  Gen: Appears to be in a lot of pain. Lungs: CTAB. CV: RRR w/o M/R/G. Abdomen: gravid, soft, no rebound or guarding. External genitalia: No external erythema, edema or excoriation. Vagina: No lesions.  Pelvic: Ext os 1 cm/internal os feels closed/thick - cephalic by Leopolds and VE/ballotable/not engaged Ext: WNL. Doptones: BL 140 w/ moderate variability, +accels, no decels Toco: Irregular, mostly irritability   ED Course   Assessment: Low suspicion of cardiac event or PE. Strong suspicion of physiologic  changes of pregnancy. Cat 1 FHRT.  Plan: ECG performed and WNL. Morphine Sulfate 10 mg IM and 8 mg Zofran ODT.   Sherre ScarletWILLIAMS, Carlito Bogert CNM, MS 01/22/16, 8:45 PM   ADDENDUM: Sxs abated almost immediately after Morphine Sulfate given. D/C'd home w/ strict labor precautions. Long discussion about common discomforts of pregnancy, esp at this GA. OB f/u as scheduled.  Sherre ScarletKimberly Lisandro Meggett, CNM 01/22/16, 10 PM

## 2016-01-23 ENCOUNTER — Encounter (HOSPITAL_COMMUNITY): Payer: Self-pay

## 2016-01-23 ENCOUNTER — Inpatient Hospital Stay (HOSPITAL_COMMUNITY)
Admission: AD | Admit: 2016-01-23 | Discharge: 2016-01-23 | Disposition: A | Discharge status: Home / Self Care | Payer: Medicaid Other | Source: Ambulatory Visit | Attending: Obstetrics and Gynecology | Admitting: Obstetrics and Gynecology

## 2016-01-23 DIAGNOSIS — Z3493 Encounter for supervision of normal pregnancy, unspecified, third trimester: Secondary | ICD-10-CM | POA: Insufficient documentation

## 2016-01-23 DIAGNOSIS — O9989 Other specified diseases and conditions complicating pregnancy, childbirth and the puerperium: Secondary | ICD-10-CM

## 2016-01-23 DIAGNOSIS — Z8659 Personal history of other mental and behavioral disorders: Secondary | ICD-10-CM

## 2016-01-23 DIAGNOSIS — O99891 Other specified diseases and conditions complicating pregnancy: Secondary | ICD-10-CM

## 2016-01-23 LAB — URINALYSIS, ROUTINE W REFLEX MICROSCOPIC
Bilirubin Urine: NEGATIVE
Glucose, UA: NEGATIVE mg/dL
Hgb urine dipstick: NEGATIVE
Ketones, ur: NEGATIVE mg/dL
Nitrite: NEGATIVE
Protein, ur: NEGATIVE mg/dL
Specific Gravity, Urine: 1.02 (ref 1.005–1.030)
pH: 6.5 (ref 5.0–8.0)

## 2016-01-23 LAB — URINE MICROSCOPIC-ADD ON

## 2016-01-23 LAB — AMNISURE RUPTURE OF MEMBRANE (ROM) NOT AT ARMC: Amnisure ROM: NEGATIVE

## 2016-01-23 NOTE — Discharge Instructions (Signed)
Fetal Movement Counts  Patient Name: __________________________________________________ Patient Due Date: ____________________  Performing a fetal movement count is highly recommended in high-risk pregnancies, but it is good for every pregnant woman to do. Your health care provider may ask you to start counting fetal movements at 28 weeks of the pregnancy. Fetal movements often increase:  · After eating a full meal.  · After physical activity.  · After eating or drinking something sweet or cold.  · At rest.  Pay attention to when you feel the baby is most active. This will help you notice a pattern of your baby's sleep and wake cycles and what factors contribute to an increase in fetal movement. It is important to perform a fetal movement count at the same time each day when your baby is normally most active.   HOW TO COUNT FETAL MOVEMENTS  1. Find a quiet and comfortable area to sit or lie down on your left side. Lying on your left side provides the best blood and oxygen circulation to your baby.  2. Write down the day and time on a sheet of paper or in a journal.  3. Start counting kicks, flutters, swishes, rolls, or jabs in a 2-hour period. You should feel at least 10 movements within 2 hours.  4. If you do not feel 10 movements in 2 hours, wait 2-3 hours and count again. Look for a change in the pattern or not enough counts in 2 hours.  SEEK MEDICAL CARE IF:  · You feel less than 10 counts in 2 hours, tried twice.  · There is no movement in over an hour.  · The pattern is changing or taking longer each day to reach 10 counts in 2 hours.  · You feel the baby is not moving as he or she usually does.  Date: ____________ Movements: ____________ Start time: ____________ Finish time: ____________   Date: ____________ Movements: ____________ Start time: ____________ Finish time: ____________  Date: ____________ Movements: ____________ Start time: ____________ Finish time: ____________  Date: ____________ Movements:  ____________ Start time: ____________ Finish time: ____________  Date: ____________ Movements: ____________ Start time: ____________ Finish time: ____________  Date: ____________ Movements: ____________ Start time: ____________ Finish time: ____________  Date: ____________ Movements: ____________ Start time: ____________ Finish time: ____________  Date: ____________ Movements: ____________ Start time: ____________ Finish time: ____________   Date: ____________ Movements: ____________ Start time: ____________ Finish time: ____________  Date: ____________ Movements: ____________ Start time: ____________ Finish time: ____________  Date: ____________ Movements: ____________ Start time: ____________ Finish time: ____________  Date: ____________ Movements: ____________ Start time: ____________ Finish time: ____________  Date: ____________ Movements: ____________ Start time: ____________ Finish time: ____________  Date: ____________ Movements: ____________ Start time: ____________ Finish time: ____________  Date: ____________ Movements: ____________ Start time: ____________ Finish time: ____________   Date: ____________ Movements: ____________ Start time: ____________ Finish time: ____________  Date: ____________ Movements: ____________ Start time: ____________ Finish time: ____________  Date: ____________ Movements: ____________ Start time: ____________ Finish time: ____________  Date: ____________ Movements: ____________ Start time: ____________ Finish time: ____________  Date: ____________ Movements: ____________ Start time: ____________ Finish time: ____________  Date: ____________ Movements: ____________ Start time: ____________ Finish time: ____________  Date: ____________ Movements: ____________ Start time: ____________ Finish time: ____________   Date: ____________ Movements: ____________ Start time: ____________ Finish time: ____________  Date: ____________ Movements: ____________ Start time: ____________ Finish  time: ____________  Date: ____________ Movements: ____________ Start time: ____________ Finish time: ____________  Date: ____________ Movements: ____________ Start time:   ____________ Finish time: ____________  Date: ____________ Movements: ____________ Start time: ____________ Finish time: ____________  Date: ____________ Movements: ____________ Start time: ____________ Finish time: ____________  Date: ____________ Movements: ____________ Start time: ____________ Finish time: ____________   Date: ____________ Movements: ____________ Start time: ____________ Finish time: ____________  Date: ____________ Movements: ____________ Start time: ____________ Finish time: ____________  Date: ____________ Movements: ____________ Start time: ____________ Finish time: ____________  Date: ____________ Movements: ____________ Start time: ____________ Finish time: ____________  Date: ____________ Movements: ____________ Start time: ____________ Finish time: ____________  Date: ____________ Movements: ____________ Start time: ____________ Finish time: ____________  Date: ____________ Movements: ____________ Start time: ____________ Finish time: ____________   Date: ____________ Movements: ____________ Start time: ____________ Finish time: ____________  Date: ____________ Movements: ____________ Start time: ____________ Finish time: ____________  Date: ____________ Movements: ____________ Start time: ____________ Finish time: ____________  Date: ____________ Movements: ____________ Start time: ____________ Finish time: ____________  Date: ____________ Movements: ____________ Start time: ____________ Finish time: ____________  Date: ____________ Movements: ____________ Start time: ____________ Finish time: ____________  Date: ____________ Movements: ____________ Start time: ____________ Finish time: ____________   Date: ____________ Movements: ____________ Start time: ____________ Finish time: ____________  Date: ____________  Movements: ____________ Start time: ____________ Finish time: ____________  Date: ____________ Movements: ____________ Start time: ____________ Finish time: ____________  Date: ____________ Movements: ____________ Start time: ____________ Finish time: ____________  Date: ____________ Movements: ____________ Start time: ____________ Finish time: ____________  Date: ____________ Movements: ____________ Start time: ____________ Finish time: ____________  Date: ____________ Movements: ____________ Start time: ____________ Finish time: ____________   Date: ____________ Movements: ____________ Start time: ____________ Finish time: ____________  Date: ____________ Movements: ____________ Start time: ____________ Finish time: ____________  Date: ____________ Movements: ____________ Start time: ____________ Finish time: ____________  Date: ____________ Movements: ____________ Start time: ____________ Finish time: ____________  Date: ____________ Movements: ____________ Start time: ____________ Finish time: ____________  Date: ____________ Movements: ____________ Start time: ____________ Finish time: ____________     This information is not intended to replace advice given to you by your health care provider. Make sure you discuss any questions you have with your health care provider.     Document Released: 09/05/2006 Document Revised: 08/27/2014 Document Reviewed: 06/02/2012  Elsevier Interactive Patient Education ©2016 [employer] Inc[employer]

## 2016-01-23 NOTE — MAU Note (Addendum)
Pt states she woke up at 1155 with contractions and her pillow wet.  Leaking clear/cloudy fluid.  Thinks her water broke.  Denies vaginal bleeding.  States she is now feeling her baby move since being placed on the EFM.

## 2016-01-23 NOTE — Progress Notes (Signed)
Pt and Dr Estanislado Pandyivard had long discussion on phone about when to return to hospital and aminsure test. Pt agreeable to go home.

## 2016-01-23 NOTE — MAU Note (Signed)
Pt had called office, wanting to go to office, be seen by one of her dr's- for a 2nd opinion. She is afraid this will be like her last preg 6472yrs ago, "she knows her body, she knows she is leaking; just like the last time was leaking for 2 days, was told all was well and then was called and told to come back in"

## 2016-01-25 ENCOUNTER — Encounter (HOSPITAL_COMMUNITY): Payer: Self-pay | Admitting: *Deleted

## 2016-01-25 ENCOUNTER — Inpatient Hospital Stay (HOSPITAL_COMMUNITY)
Admission: AD | Admit: 2016-01-25 | Discharge: 2016-01-25 | Disposition: A | Discharge status: Home / Self Care | Payer: Medicaid Other | Source: Ambulatory Visit | Attending: Obstetrics and Gynecology | Admitting: Obstetrics and Gynecology

## 2016-01-25 DIAGNOSIS — D509 Iron deficiency anemia, unspecified: Secondary | ICD-10-CM | POA: Insufficient documentation

## 2016-01-25 DIAGNOSIS — O368130 Decreased fetal movements, third trimester, not applicable or unspecified: Secondary | ICD-10-CM

## 2016-01-25 DIAGNOSIS — Z87891 Personal history of nicotine dependence: Secondary | ICD-10-CM | POA: Insufficient documentation

## 2016-01-25 DIAGNOSIS — Z3A38 38 weeks gestation of pregnancy: Secondary | ICD-10-CM | POA: Insufficient documentation

## 2016-01-25 DIAGNOSIS — O99013 Anemia complicating pregnancy, third trimester: Secondary | ICD-10-CM | POA: Insufficient documentation

## 2016-01-25 LAB — URINALYSIS, ROUTINE W REFLEX MICROSCOPIC
Bilirubin Urine: NEGATIVE
Glucose, UA: NEGATIVE mg/dL
Hgb urine dipstick: NEGATIVE
Ketones, ur: NEGATIVE mg/dL
Nitrite: NEGATIVE
Protein, ur: NEGATIVE mg/dL
Specific Gravity, Urine: 1.015 (ref 1.005–1.030)
pH: 7 (ref 5.0–8.0)

## 2016-01-25 LAB — URINE MICROSCOPIC-ADD ON

## 2016-01-25 NOTE — Discharge Instructions (Signed)
Fetal Movement Counts  Patient Name: __________________________________________________ Patient Due Date: ____________________  Performing a fetal movement count is highly recommended in high-risk pregnancies, but it is good for every pregnant woman to do. Your health care provider may ask you to start counting fetal movements at 28 weeks of the pregnancy. Fetal movements often increase:  · After eating a full meal.  · After physical activity.  · After eating or drinking something sweet or cold.  · At rest.  Pay attention to when you feel the baby is most active. This will help you notice a pattern of your baby's sleep and wake cycles and what factors contribute to an increase in fetal movement. It is important to perform a fetal movement count at the same time each day when your baby is normally most active.   HOW TO COUNT FETAL MOVEMENTS  1. Find a quiet and comfortable area to sit or lie down on your left side. Lying on your left side provides the best blood and oxygen circulation to your baby.  2. Write down the day and time on a sheet of paper or in a journal.  3. Start counting kicks, flutters, swishes, rolls, or jabs in a 2-hour period. You should feel at least 10 movements within 2 hours.  4. If you do not feel 10 movements in 2 hours, wait 2-3 hours and count again. Look for a change in the pattern or not enough counts in 2 hours.  SEEK MEDICAL CARE IF:  · You feel less than 10 counts in 2 hours, tried twice.  · There is no movement in over an hour.  · The pattern is changing or taking longer each day to reach 10 counts in 2 hours.  · You feel the baby is not moving as he or she usually does.  Date: ____________ Movements: ____________ Start time: ____________ Finish time: ____________   Date: ____________ Movements: ____________ Start time: ____________ Finish time: ____________  Date: ____________ Movements: ____________ Start time: ____________ Finish time: ____________  Date: ____________ Movements:  ____________ Start time: ____________ Finish time: ____________  Date: ____________ Movements: ____________ Start time: ____________ Finish time: ____________  Date: ____________ Movements: ____________ Start time: ____________ Finish time: ____________  Date: ____________ Movements: ____________ Start time: ____________ Finish time: ____________  Date: ____________ Movements: ____________ Start time: ____________ Finish time: ____________   Date: ____________ Movements: ____________ Start time: ____________ Finish time: ____________  Date: ____________ Movements: ____________ Start time: ____________ Finish time: ____________  Date: ____________ Movements: ____________ Start time: ____________ Finish time: ____________  Date: ____________ Movements: ____________ Start time: ____________ Finish time: ____________  Date: ____________ Movements: ____________ Start time: ____________ Finish time: ____________  Date: ____________ Movements: ____________ Start time: ____________ Finish time: ____________  Date: ____________ Movements: ____________ Start time: ____________ Finish time: ____________   Date: ____________ Movements: ____________ Start time: ____________ Finish time: ____________  Date: ____________ Movements: ____________ Start time: ____________ Finish time: ____________  Date: ____________ Movements: ____________ Start time: ____________ Finish time: ____________  Date: ____________ Movements: ____________ Start time: ____________ Finish time: ____________  Date: ____________ Movements: ____________ Start time: ____________ Finish time: ____________  Date: ____________ Movements: ____________ Start time: ____________ Finish time: ____________  Date: ____________ Movements: ____________ Start time: ____________ Finish time: ____________   Date: ____________ Movements: ____________ Start time: ____________ Finish time: ____________  Date: ____________ Movements: ____________ Start time: ____________ Finish  time: ____________  Date: ____________ Movements: ____________ Start time: ____________ Finish time: ____________  Date: ____________ Movements: ____________ Start time:   ____________ Finish time: ____________  Date: ____________ Movements: ____________ Start time: ____________ Finish time: ____________  Date: ____________ Movements: ____________ Start time: ____________ Finish time: ____________  Date: ____________ Movements: ____________ Start time: ____________ Finish time: ____________   Date: ____________ Movements: ____________ Start time: ____________ Finish time: ____________  Date: ____________ Movements: ____________ Start time: ____________ Finish time: ____________  Date: ____________ Movements: ____________ Start time: ____________ Finish time: ____________  Date: ____________ Movements: ____________ Start time: ____________ Finish time: ____________  Date: ____________ Movements: ____________ Start time: ____________ Finish time: ____________  Date: ____________ Movements: ____________ Start time: ____________ Finish time: ____________  Date: ____________ Movements: ____________ Start time: ____________ Finish time: ____________   Date: ____________ Movements: ____________ Start time: ____________ Finish time: ____________  Date: ____________ Movements: ____________ Start time: ____________ Finish time: ____________  Date: ____________ Movements: ____________ Start time: ____________ Finish time: ____________  Date: ____________ Movements: ____________ Start time: ____________ Finish time: ____________  Date: ____________ Movements: ____________ Start time: ____________ Finish time: ____________  Date: ____________ Movements: ____________ Start time: ____________ Finish time: ____________  Date: ____________ Movements: ____________ Start time: ____________ Finish time: ____________   Date: ____________ Movements: ____________ Start time: ____________ Finish time: ____________  Date: ____________  Movements: ____________ Start time: ____________ Finish time: ____________  Date: ____________ Movements: ____________ Start time: ____________ Finish time: ____________  Date: ____________ Movements: ____________ Start time: ____________ Finish time: ____________  Date: ____________ Movements: ____________ Start time: ____________ Finish time: ____________  Date: ____________ Movements: ____________ Start time: ____________ Finish time: ____________  Date: ____________ Movements: ____________ Start time: ____________ Finish time: ____________   Date: ____________ Movements: ____________ Start time: ____________ Finish time: ____________  Date: ____________ Movements: ____________ Start time: ____________ Finish time: ____________  Date: ____________ Movements: ____________ Start time: ____________ Finish time: ____________  Date: ____________ Movements: ____________ Start time: ____________ Finish time: ____________  Date: ____________ Movements: ____________ Start time: ____________ Finish time: ____________  Date: ____________ Movements: ____________ Start time: ____________ Finish time: ____________     This information is not intended to replace advice given to you by your health care provider. Make sure you discuss any questions you have with your health care provider.     Document Released: 09/05/2006 Document Revised: 08/27/2014 Document Reviewed: 06/02/2012  Elsevier Interactive Patient Education ©2016 [employer] Inc[employer]

## 2016-01-25 NOTE — MAU Note (Signed)
Pt reports decreased fetal movement today , has not felt the baby move since 7 pm. Also reports pain in her lower back and pelvic. Denies bleeding or ROM

## 2016-01-25 NOTE — MAU Provider Note (Signed)
  History  Melanie Hancock is a 20 yo [id number] @ 38.6 wks who presents to MAU after calling w/ c/o decreased FM x 2 hrs in spite of po fluids/food. +FM. Denies ctxs, VB or LOF.  Patient Active Problem List   Diagnosis Date Noted  . History of postpartum depression, currently pregnant 01/23/2016  . Iron deficiency anemia 01/02/2016  . 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 Anxiety 03/28/2014  . Chronic back pain 03/28/2014  . Hx pyelonephritis 2014 05/26/2013    Chief Complaint  Patient presents with  . Decreased Fetal Movement   HPI  As above  OB History    Gravida Para Term Preterm AB TAB SAB Ectopic Multiple Living   5 2 2  2 1 1   0 2      Past Medical History  Diagnosis Date  . Infection     UTI  . UTI (lower urinary tract infection)   . Anxiety   . Anemia   . Pyelonephritis   . Chronic back pain   . Depression   . Post partum depression     Past Surgical History  Procedure Laterality Date  . Induced abortion    . Dilation and curettage of uterus    . Wisdom tooth extraction      Family History  Problem Relation Age of Onset  . Alcohol abuse Neg Hx     Social History  Substance Use Topics  . Smoking status: Former Smoker    Quit date: 01/21/2014  . Smokeless tobacco: Never Used  . Alcohol Use: No    Allergies: No Known Allergies  Prescriptions prior to admission  Medication Sig Dispense Refill Last Dose  . cyclobenzaprine (FLEXERIL) 10 MG tablet Take 10 mg by mouth 3 (three) times daily as needed for muscle spasms.   Past Week at Unknown time  . ferrous gluconate (FERGON) 324 MG tablet Take 648 mg by mouth at bedtime.   2 01/24/2016 at Unknown time  . hydrOXYzine (ATARAX/VISTARIL) 10 MG tablet Take 10 mg by mouth 3 (three) times daily as needed for anxiety.   Past Week at Unknown time  . Pediatric Multiple Vit-C-FA (FLINSTONES GUMMIES OMEGA-3 DHA PO) Take 1 each by mouth 2 (two) times daily.    01/24/2016 at Unknown time  . valACYclovir (VALTREX) 1000 MG tablet  Take 1,000 mg by mouth at bedtime.   0 01/24/2016 at Unknown time    ROS  As noted in the HPI. Physical Exam   Blood pressure 106/56, pulse 69, temperature 98 F (36.7 C), temperature source Oral, resp. rate 18, height 5\' 5"  (1.651 m), weight 83.462 kg (184 lb), last menstrual period 04/23/2015, SpO2 100 %, not currently breastfeeding.    Physical Exam  Gen: NAD. Pelvic: Ext os 1, internal os closed, thick, posterior FHRT: BL 135 w/ moderate variability, +accels, no decels. Toco: No ctxs.     ED Course  Assessment: Cat 1 FHRT  Plan: D/C home w/ strict kick counts and other labor precautions. OB f/u tomorrow.   Sherre ScarletWILLIAMS, Twila Rappa CNM, MS 01/25/2016 10:12 PM

## 2016-01-27 ENCOUNTER — Encounter (HOSPITAL_COMMUNITY): Payer: Self-pay | Admitting: *Deleted

## 2016-01-27 ENCOUNTER — Inpatient Hospital Stay (HOSPITAL_COMMUNITY)
Admission: AD | Admit: 2016-01-27 | Discharge: 2016-01-28 | Disposition: A | Discharge status: Home / Self Care | Payer: Medicaid Other | Source: Ambulatory Visit | Attending: Obstetrics and Gynecology | Admitting: Obstetrics and Gynecology

## 2016-01-27 DIAGNOSIS — Z3A39 39 weeks gestation of pregnancy: Secondary | ICD-10-CM | POA: Insufficient documentation

## 2016-01-27 DIAGNOSIS — M549 Dorsalgia, unspecified: Secondary | ICD-10-CM | POA: Insufficient documentation

## 2016-01-27 DIAGNOSIS — F419 Anxiety disorder, unspecified: Secondary | ICD-10-CM | POA: Insufficient documentation

## 2016-01-27 DIAGNOSIS — O26893 Other specified pregnancy related conditions, third trimester: Secondary | ICD-10-CM | POA: Insufficient documentation

## 2016-01-27 DIAGNOSIS — G8929 Other chronic pain: Secondary | ICD-10-CM | POA: Insufficient documentation

## 2016-01-27 DIAGNOSIS — Z87891 Personal history of nicotine dependence: Secondary | ICD-10-CM | POA: Insufficient documentation

## 2016-01-27 DIAGNOSIS — O99343 Other mental disorders complicating pregnancy, third trimester: Secondary | ICD-10-CM | POA: Insufficient documentation

## 2016-01-27 DIAGNOSIS — D509 Iron deficiency anemia, unspecified: Secondary | ICD-10-CM | POA: Insufficient documentation

## 2016-01-27 DIAGNOSIS — O99013 Anemia complicating pregnancy, third trimester: Secondary | ICD-10-CM | POA: Insufficient documentation

## 2016-01-27 DIAGNOSIS — F329 Major depressive disorder, single episode, unspecified: Secondary | ICD-10-CM | POA: Insufficient documentation

## 2016-01-27 NOTE — MAU Note (Signed)
Pt reports having ctx off and on since this morning. Deneis vag bleeding or leaking and good fetal movement felt.

## 2016-01-28 DIAGNOSIS — Z3A39 39 weeks gestation of pregnancy: Secondary | ICD-10-CM | POA: Diagnosis not present

## 2016-01-28 DIAGNOSIS — O99013 Anemia complicating pregnancy, third trimester: Secondary | ICD-10-CM | POA: Diagnosis not present

## 2016-01-28 DIAGNOSIS — D509 Iron deficiency anemia, unspecified: Secondary | ICD-10-CM | POA: Diagnosis not present

## 2016-01-28 DIAGNOSIS — F329 Major depressive disorder, single episode, unspecified: Secondary | ICD-10-CM | POA: Diagnosis not present

## 2016-01-28 DIAGNOSIS — O26893 Other specified pregnancy related conditions, third trimester: Secondary | ICD-10-CM | POA: Diagnosis not present

## 2016-01-28 DIAGNOSIS — G8929 Other chronic pain: Secondary | ICD-10-CM | POA: Diagnosis not present

## 2016-01-28 DIAGNOSIS — Z87891 Personal history of nicotine dependence: Secondary | ICD-10-CM | POA: Diagnosis not present

## 2016-01-28 DIAGNOSIS — F419 Anxiety disorder, unspecified: Secondary | ICD-10-CM | POA: Diagnosis not present

## 2016-01-28 DIAGNOSIS — O99343 Other mental disorders complicating pregnancy, third trimester: Secondary | ICD-10-CM | POA: Diagnosis not present

## 2016-01-28 DIAGNOSIS — M549 Dorsalgia, unspecified: Secondary | ICD-10-CM | POA: Diagnosis not present

## 2016-01-28 MED ORDER — OXYCODONE-ACETAMINOPHEN 5-325 MG PO TABS
2.0000 | ORAL_TABLET | Freq: Once | ORAL | Status: AC
Start: 2016-01-28 — End: 2016-01-28
  Administered 2016-01-28: 2 via ORAL
  Filled 2016-01-28: qty 2

## 2016-01-28 MED ORDER — MORPHINE SULFATE (PF) 4 MG/ML IV SOLN: 4.0000 mg | Freq: Once | INTRAVENOUS | Status: DC

## 2016-01-28 NOTE — MAU Provider Note (Signed)
Melanie Hancock is a 20 yo, G5P2022, at 39.0 wks presenting to MAU announced for contractions, back pain, and decreased fetal movement.   Denies lof or Vb.   Reports she missed her scheduled prenatal visit.  Next scheduled prenatal visit is 6/13.      History     Patient Active Problem List   Diagnosis Date Noted  . History of postpartum depression, currently pregnant 01/23/2016  . Iron deficiency anemia 01/02/2016  . 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 Anxiety 03/28/2014  . Chronic back pain 03/28/2014  . Hx pyelonephritis 2014 05/26/2013    Chief Complaint  Patient presents with  . Labor Eval   HPI  OB History    Gravida Para Term Preterm AB TAB SAB Ectopic Multiple Living   5 2 2  2 1 1   0 2      Past Medical History  Diagnosis Date  . Infection     UTI  . UTI (lower urinary tract infection)   . Anxiety   . Anemia   . Pyelonephritis   . Chronic back pain   . Depression   . Post partum depression     Past Surgical History  Procedure Laterality Date  . Induced abortion    . Dilation and curettage of uterus    . Wisdom tooth extraction      Family History  Problem Relation Age of Onset  . Alcohol abuse Neg Hx     Social History  Substance Use Topics  . Smoking status: Former Smoker    Quit date: 01/21/2014  . Smokeless tobacco: Never Used  . Alcohol Use: No    Allergies: No Known Allergies  No prescriptions prior to admission    Review of Systems  Constitutional: Negative.   HENT: Negative.   Eyes: Negative.   Respiratory: Negative.   Cardiovascular: Negative.   Gastrointestinal: Negative.   Genitourinary: Negative.   Musculoskeletal: Positive for back pain.  Skin: Negative.   Neurological: Negative.   Endo/Heme/Allergies: Negative.   Psychiatric/Behavioral: Negative.    Physical Exam   Blood pressure 113/63, pulse 65, resp. rate 18, last menstrual period 04/23/2015, not currently breastfeeding.  FHT: Cat 1, 125 bpm, moderate variability,  +accels, no decels UC: rare Ctx,  Dilation: 2.5 Effacement (%): 40 Cervical Position: Middle Station: -2 Presentation: Vertex Exam by:: K.WIlson,RN  Physical Exam  Constitutional: She is oriented to person, place, and time. She appears well-developed.  HENT:  Head: Normocephalic.  Eyes: Pupils are equal, round, and reactive to light.  Neck: Normal range of motion.  Cardiovascular: Normal rate and regular rhythm.   Respiratory: Effort normal.  GI: Soft.  Musculoskeletal: Normal range of motion.  Neurological: She is alert and oriented to person, place, and time.  Skin: Skin is warm and dry.  Psychiatric: She has a normal mood and affect. Her behavior is normal.    ED Course  Assessment: IUP 39.0 wks No labor - cervix unchanged despite ambulating 1-2 hrs Back pain Cat 1 FT   Plan: DC home in stable condition Offered Morphine for therapeutic rest , pt states morphine made her nauseous-  preferred to try percocet   Kick counts discussed Labor precautions reviewed Encouraged to keep prenatal visit for next Tuesday Call PRN   Alphonzo Severanceachel Antoney Biven CNM, MSN 01/28/2016 5:55 AM

## 2016-01-30 ENCOUNTER — Encounter: Payer: Medicaid Other | Admitting: Hematology

## 2016-01-30 ENCOUNTER — Telehealth: Payer: Self-pay | Admitting: Hematology

## 2016-01-30 NOTE — Telephone Encounter (Signed)
I called pt to add lab for today (6/12) pt said she could not find a ride... pt said she wanted to do the lab & office visit another day this week... pt said Melanie Hancock will work.. lab/visit sched for 6/14 per pt request

## 2016-02-01 ENCOUNTER — Ambulatory Visit (HOSPITAL_BASED_OUTPATIENT_CLINIC_OR_DEPARTMENT_OTHER): Payer: Medicaid Other | Admitting: Hematology

## 2016-02-01 ENCOUNTER — Encounter (HOSPITAL_COMMUNITY): Payer: Self-pay

## 2016-02-01 ENCOUNTER — Other Ambulatory Visit (HOSPITAL_BASED_OUTPATIENT_CLINIC_OR_DEPARTMENT_OTHER): Payer: Medicaid Other

## 2016-02-01 ENCOUNTER — Inpatient Hospital Stay (HOSPITAL_COMMUNITY)
Admission: AD | Admit: 2016-02-01 | Discharge: 2016-02-01 | Disposition: A | Discharge status: Home / Self Care | Payer: Medicaid Other | Source: Ambulatory Visit | Attending: Obstetrics and Gynecology | Admitting: Obstetrics and Gynecology

## 2016-02-01 VITALS — BP 102/49 | HR 72 | Temp 98.1°F | Resp 18 | Ht 65.0 in | Wt 185.0 lb

## 2016-02-01 DIAGNOSIS — R718 Other abnormality of red blood cells: Secondary | ICD-10-CM | POA: Diagnosis not present

## 2016-02-01 DIAGNOSIS — Z3493 Encounter for supervision of normal pregnancy, unspecified, third trimester: Secondary | ICD-10-CM | POA: Insufficient documentation

## 2016-02-01 DIAGNOSIS — D509 Iron deficiency anemia, unspecified: Secondary | ICD-10-CM | POA: Diagnosis present

## 2016-02-01 LAB — CBC & DIFF AND RETIC
BASO%: 0.3 % (ref 0.0–2.0)
Basophils Absolute: 0 10*3/uL (ref 0.0–0.1)
EOS%: 3.9 % (ref 0.0–7.0)
Eosinophils Absolute: 0.3 10*3/uL (ref 0.0–0.5)
HCT: 33.4 % — ABNORMAL LOW (ref 34.8–46.6)
HGB: 10.8 g/dL — ABNORMAL LOW (ref 11.6–15.9)
Immature Retic Fract: 13.2 % — ABNORMAL HIGH (ref 1.60–10.00)
LYMPH%: 19.1 % (ref 14.0–49.7)
MCH: 24.7 pg — ABNORMAL LOW (ref 25.1–34.0)
MCHC: 32.3 g/dL (ref 31.5–36.0)
MCV: 76.3 fL — ABNORMAL LOW (ref 79.5–101.0)
MONO#: 0.6 10*3/uL (ref 0.1–0.9)
MONO%: 9.8 % (ref 0.0–14.0)
NEUT#: 4.2 10*3/uL (ref 1.5–6.5)
NEUT%: 66.9 % (ref 38.4–76.8)
Platelets: 215 10*3/uL (ref 145–400)
RBC: 4.38 10*6/uL (ref 3.70–5.45)
RDW: 28.1 % — ABNORMAL HIGH (ref 11.2–14.5)
Retic %: 2.23 % — ABNORMAL HIGH (ref 0.70–2.10)
Retic Ct Abs: 97.67 10*3/uL — ABNORMAL HIGH (ref 33.70–90.70)
WBC: 6.3 10*3/uL (ref 3.9–10.3)
lymph#: 1.2 10*3/uL (ref 0.9–3.3)

## 2016-02-01 LAB — URINE MICROSCOPIC-ADD ON: RBC / HPF: NONE SEEN RBC/hpf (ref 0–5)

## 2016-02-01 LAB — URINALYSIS, ROUTINE W REFLEX MICROSCOPIC
Bilirubin Urine: NEGATIVE
Glucose, UA: NEGATIVE mg/dL
Hgb urine dipstick: NEGATIVE
Ketones, ur: NEGATIVE mg/dL
Nitrite: NEGATIVE
Protein, ur: NEGATIVE mg/dL
Specific Gravity, Urine: 1.01 (ref 1.005–1.030)
pH: 7 (ref 5.0–8.0)

## 2016-02-01 LAB — COMPREHENSIVE METABOLIC PANEL
ALT: 9 U/L (ref 0–55)
AST: 13 U/L (ref 5–34)
Albumin: 2.7 g/dL — ABNORMAL LOW (ref 3.5–5.0)
Alkaline Phosphatase: 135 U/L (ref 40–150)
Anion Gap: 7 mEq/L (ref 3–11)
BUN: 5.3 mg/dL — ABNORMAL LOW (ref 7.0–26.0)
CO2: 24 mEq/L (ref 22–29)
Calcium: 9 mg/dL (ref 8.4–10.4)
Chloride: 106 mEq/L (ref 98–109)
Creatinine: 0.7 mg/dL (ref 0.6–1.1)
EGFR: >90 mL/min/{1.73_m2} (ref >90)
Glucose: 80 mg/dl (ref 70–140)
Potassium: 3.4 mEq/L — ABNORMAL LOW (ref 3.5–5.1)
Sodium: 136 mEq/L (ref 136–145)
Total Bilirubin: 0.54 mg/dL (ref 0.20–1.20)
Total Protein: 6.8 g/dL (ref 6.4–8.3)

## 2016-02-01 MED ORDER — ACETAMINOPHEN 500 MG PO TABS
1000.0000 mg | ORAL_TABLET | Freq: Once | ORAL | Status: DC
  Filled 2016-02-01: qty 2

## 2016-02-01 MED ORDER — OXYCODONE-ACETAMINOPHEN 5-325 MG PO TABS
2.0000 | ORAL_TABLET | Freq: Once | ORAL | Status: AC
  Administered 2016-02-01: 2 via ORAL
  Filled 2016-02-01: qty 2

## 2016-02-01 NOTE — Progress Notes (Signed)
Marland Kitchen    HEMATOLOGY/ONCOLOGY CONSULTATION NOTE  Date of Service: 02/01/2016   Patient Care Team: No Pcp Per Patient as PCP - General (General Practice)  CHIEF COMPLAINTS/PURPOSE OF CONSULTATION:  Severe iron deficiency anemia  HISTORY OF PRESENTING ILLNESS:   Melanie Hancock is a  20 y.o. female who has been referred to [place] by Dr Chip Boer [place]/Dr Dillard ([facility]) for evaluation and management of iron deficiency anemia during pregnancy.  Patient notes she has a history of chronic anemia that was noted to be significant for the first time about 6 years ago when she was pregnant with her first son. Even prior to that she was to have very heavy periods lasting 5-7 days. She was on oral iron at the time of her first pregnancy and did not require any PRBC transfusions. She notes that she was not really very compliant with iron replacement post pregnancy. She didn't breast feed her first child. She was noted to have significant iron deficiency in 2014 with a hemoglobin of 6.6 requiring PRBC transfusion. She had her second pregnancy in 2015 and delivered her second child in April 2016 with issues of significant iron deficiency anemia as well. She was to get additional PRBCs for hemoglobin in the 6 range but moved to [facility] and did not follow-up for her scheduled transfusions. She is currently nearly [redacted] weeks pregnant with an expected date of delivery of 02/02/2016. She presented to her OB GYN team with significant fatigue and a hemoglobin in the 8 range on 12/22/2015 and received 3 units of PRBCs. She has history of no-shows and was referred previously to the clinic earlier in pregnancy but did not show up. She is here today for further evaluation. She notes significant fatigue and some back symptoms characterized by ice craving.  Her posttransfusion labs today show a hemoglobin of 10.4 with hematocrit of 33 MCV of 70.6 with a normal WBC count and platelets. Noted to have normal CMP other  than a mild hypokalemia of 3.3 which is better than a potassium of 2.9. And an albumin of 2.8. She was started on ferrous sulfate 1 tablet by mouth 3 times a day about 2-3 months ago but has been relatively noncompliant with this and an average takes  one tablet day. She notes that it causes abdominal discomfort and significant constipation.  She has had very low ferritin levels in the past but the ferritin level from today is currently pending. We discussed different treatments tried deficiency including continue oral iron which she seems did not feel she can take and alternatively IV iron replacement. We also discussed the timing of the IV iron replacement since this has abating on which iron preparation will be chosen.  Patient notes no overt blood loss. No vaginal bleeding. No blood in her urine or stools. No epistaxis. Patient notes she is significantly fatigued and would like to be treated with iron as soon as possible. No previous IV iron use she has had PRBC transfusions.  INTERVAL HISTORY  Melanie Hancock is here for her scheduled follow-up. She did not follow up to receive IV Venofer due to logistical issues with regards to having rides etc. she has been taking 2 tablets of ferrous sulfate every night. Her hemoglobin has remained stable. Her microcytosis is improving. She notes that her energy level is better. She has had no issues with taking her oral iron. She does not want to consider IV iron at this time. Her expected date of delivery is tomorrow  and she feels that she can't wait to have her baby delivered. No other acute new concerns at this time.  MEDICAL HISTORY:  Past Medical History  Diagnosis Date  . Infection     UTI  . UTI (lower urinary tract infection)   . Anxiety   . Anemia   . Pyelonephritis   . Chronic back pain   . Depression   . Post partum depression     SURGICAL HISTORY: Past Surgical History  Procedure Laterality Date  . Induced abortion    . Dilation and  curettage of uterus    . Wisdom tooth extraction      SOCIAL HISTORY: Social History   Social History  . Marital Status: Single    Spouse Name: N/A  . Number of Children: N/A  . Years of Education: N/A   Occupational History  . Not on file.   Social History Main Topics  . Smoking status: Former Smoker    Quit date: 01/21/2014  . Smokeless tobacco: Never Used  . Alcohol Use: No  . Drug Use: No  . Sexual Activity: Yes    Birth Control/ Protection: None   Other Topics Concern  . Not on file   Social History Narrative    FAMILY HISTORY: Family History  Problem Relation Age of Onset  . Alcohol abuse Neg Hx   No family history of known sickle cell disease or other hemoglobin disorders or blood disorders . No family history of bleeding or clotting disorders   no family history of cancers.  ALLERGIES:  has No Known Allergies.  MEDICATIONS:  Current Outpatient Prescriptions  Medication Sig Dispense Refill  . cyclobenzaprine (FLEXERIL) 10 MG tablet Take 10 mg by mouth 3 (three) times daily as needed for muscle spasms.    . ferrous gluconate (FERGON) 324 MG tablet Take 648 mg by mouth at bedtime.   2  . hydrOXYzine (ATARAX/VISTARIL) 10 MG tablet Take 10 mg by mouth 3 (three) times daily as needed for anxiety.    . Pediatric Multiple Vit-C-FA (FLINSTONES GUMMIES OMEGA-3 DHA PO) Take 1 each by mouth 2 (two) times daily.     . valACYclovir (VALTREX) 1000 MG tablet Take 1,000 mg by mouth at bedtime.   0   No current facility-administered medications for this visit.  Flexeril when necessary for muscle cramps .  REVIEW OF SYSTEMS:    10 Point review of Systems was done is negative except as noted above.  PHYSICAL EXAMINATION: ECOG PERFORMANCE STATUS: 1 - Symptomatic but completely ambulatory  . Filed Vitals:   02/01/16 1511  BP: 102/49  Pulse: 72  Temp: 98.1 F (36.7 C)  Resp: 18   Filed Weights   02/01/16 1511  Weight: 185 lb (83.915 kg)   .Body mass index is  30.79 kg/(m^2).  GENERAL:alert, in no acute distress and comfortable SKIN: Pale-appearing, no rashes or significant lesions EYES: normal, conjunctiva are pink and non-injected, sclera clear OROPHARYNX:no exudate, no erythema and lips, buccal mucosa, and tongue normal  NECK: supple, no JVD, thyroid normal size, non-tender, without nodularity LYMPH:  no palpable lymphadenopathy in the cervical, axillary or inguinal LUNGS: clear to auscultation with normal respiratory effort HEART: regular rate & rhythm,  no murmurs and no lower extremity edema ABDOMEN: abdomen Distended with gravid uterusal: no cyanosis of digits and no clubbing  PSYCH: alert & oriented x 3 with fluent speech NEURO: no focal motor/sensory deficits  LABORATORY DATA:  I have reviewed the data as listed  . CBC  Latest Ref Rng 02/01/2016 01/19/2016 01/02/2016  WBC 3.9 - 10.3 10e3/uL 6.3 5.5 7.3  Hemoglobin 11.6 - 15.9 g/dL 10.8(L) 11.9(L) 10.4(L)  Hematocrit 34.8 - 46.6 % 33.4(L) 37.3 33.2(L)  Platelets 145 - 400 10e3/uL 215 265 262    . CMP Latest Ref Rng 02/01/2016 01/02/2016 07/20/2015  Glucose 70 - 140 mg/dl 80 93 88  BUN 7.0 - 16.126.0 mg/dL 5.3(L) 5.5(L) 7  Creatinine 0.6 - 1.1 mg/dL 0.7 0.7 0.960.58  Sodium 045136 - 145 mEq/L 136 136 132(L)  Potassium 3.5 - 5.1 mEq/L 3.4(L) 3.3(L) 2.9(L)  Chloride 101 - 111 mmol/L - - 100(L)  CO2 22 - 29 mEq/L 24 24 23   Calcium 8.4 - 10.4 mg/dL 9.0 9.0 4.0(J8.8(L)  Total Protein 6.4 - 8.3 g/dL 6.8 7.1 -  Total Bilirubin 0.20 - 1.20 mg/dL 8.110.54 9.140.60 -  Alkaline Phos 40 - 150 U/L 135 105 -  AST 5 - 34 U/L 13 13 -  ALT 0 - 55 U/L 9 <9 -   . Lab Results  Component Value Date   IRON 112 01/02/2016   TIBC 466* 01/02/2016   IRONPCTSAT 24 01/02/2016   (Iron and TIBC)  Lab Results  Component Value Date   FERRITIN 14 01/02/2016    Ferritin results fromToday are still pending.   RADIOGRAPHIC STUDIES: I have personally reviewed the radiological images as listed and agreed with the findings in  the report. No results found.  ASSESSMENT & PLAN:   20 year old African-American female with chronic severe iron deficiency anemia requiring PRBC transfusions in the past currently about [redacted] weeks pregnant   #1 acute on chronic iron deficiency anemia.  Patient likely has chronic iron deficiency due to heavy periods followed by pregnancies and quick succession without adequate iron replacement. Has not been very compliant with oral iron replacement due to GI intolerance. Has also had issues with medical follow-up. Was transfused 3 PRBCs on 12/22/2015 for symptomatic anemia. She is currently nearly [redacted] weeks pregnant with an expected date of delivery of 02/02/2016. Currently her hemoglobin levels are reasonable given her recent transfusions.  Patient did not follow-up for her planned IV Venofer treatment due to logistical issues and personal preference. Her hemoglobin level has remained reasonably stable and her microcytosis is improving. She notes her fatigue is improved. Her expected date of delivery is 02/02/2016.  Plan -Patient appears to be tolerating oral iron at this time with relatively stable hemoglobin levels. -Would continue ferrous sulfate 1 tablet by mouth 3 times a day. -Follow-up with PCP/ObGyn in 4-6 weeks for rpt CBC and ferritin. -Continue oral iron replacement until ferritin more than 50 and then might reduce to ferrous sulfate 1 tablet by mouth daily. -No indication for immediate use of IV iron at this time and per patient's preference.  We'll not set up a specific hematology follow-up at this time. Kindly reconsult us if any new questions or concerns arise.  All of the patients questions were answered to her apparent satisfaction. The patient knows to call the clinic with any problems, questions or concerns.  I spent 15 minutes counseling the patient face to face. The total time spent in the appointment was 20 minutes and more than 50% was on counseling and direct patient  cares.    Wyvonnia LoraGautam Kale MD MS AAHIVMS [facility] Hematology/Oncology Physician [facility]  (Office):       [phone] (Work cell):  [phone] (Fax):           [phone]

## 2016-02-01 NOTE — MAU Note (Addendum)
abd pain and pain in pelvis.  Been hurting since she had her membranes swept yesterday.  Has been contracting a lot more since then.  Was 3 cm

## 2016-02-01 NOTE — Progress Notes (Signed)
This encounter was created in error - please disregard.

## 2016-02-02 LAB — FERRITIN: Ferritin: 29 ng/ml (ref 9–269)

## 2016-02-03 ENCOUNTER — Telehealth (HOSPITAL_COMMUNITY): Payer: Self-pay | Admitting: *Deleted

## 2016-02-03 ENCOUNTER — Inpatient Hospital Stay (HOSPITAL_COMMUNITY)
Admission: AD | Admit: 2016-02-03 | Discharge: 2016-02-03 | Disposition: A | Discharge status: Home / Self Care | Payer: Medicaid Other | Source: Ambulatory Visit | Attending: Obstetrics and Gynecology | Admitting: Obstetrics and Gynecology

## 2016-02-03 ENCOUNTER — Encounter (HOSPITAL_COMMUNITY): Payer: Self-pay | Admitting: *Deleted

## 2016-02-03 MED ORDER — MORPHINE SULFATE (PF) 4 MG/ML IV SOLN
4.0000 mg | Freq: Once | INTRAVENOUS | Status: DC
  Filled 2016-02-03: qty 1

## 2016-02-03 NOTE — Telephone Encounter (Signed)
Preadmission screen  

## 2016-02-03 NOTE — MAU Note (Signed)
Pt states she has been experiencing U/C's since yesterday morning.  Felt a "pop" in her abd this morning.  Denies any ROM or vaginal bleeding.  Good fetal movement.

## 2016-02-03 NOTE — MAU Note (Signed)
Patient refused morphine pain med that Fleet ContrasRachel ordered she req percocet. Fleet ContrasRachel says no, so patient is discharged without pain med.

## 2016-02-03 NOTE — Discharge Instructions (Signed)

## 2016-02-04 ENCOUNTER — Inpatient Hospital Stay (HOSPITAL_COMMUNITY)
Admission: AD | Admit: 2016-02-04 | Discharge: 2016-02-06 | DRG: 775 | Disposition: A | Discharge status: Home / Self Care | Payer: Medicaid Other | Source: Ambulatory Visit | Attending: Obstetrics & Gynecology | Admitting: Obstetrics & Gynecology

## 2016-02-04 ENCOUNTER — Inpatient Hospital Stay (HOSPITAL_COMMUNITY): Payer: Medicaid Other | Admitting: Anesthesiology

## 2016-02-04 ENCOUNTER — Encounter (HOSPITAL_COMMUNITY): Payer: Self-pay | Admitting: *Deleted

## 2016-02-04 DIAGNOSIS — Z3A40 40 weeks gestation of pregnancy: Secondary | ICD-10-CM

## 2016-02-04 DIAGNOSIS — Z87891 Personal history of nicotine dependence: Secondary | ICD-10-CM | POA: Diagnosis not present

## 2016-02-04 DIAGNOSIS — M549 Dorsalgia, unspecified: Secondary | ICD-10-CM | POA: Diagnosis present

## 2016-02-04 DIAGNOSIS — Z8261 Family history of arthritis: Secondary | ICD-10-CM | POA: Diagnosis not present

## 2016-02-04 DIAGNOSIS — G8929 Other chronic pain: Secondary | ICD-10-CM | POA: Diagnosis present

## 2016-02-04 DIAGNOSIS — O9902 Anemia complicating childbirth: Secondary | ICD-10-CM | POA: Diagnosis present

## 2016-02-04 DIAGNOSIS — D509 Iron deficiency anemia, unspecified: Secondary | ICD-10-CM | POA: Diagnosis present

## 2016-02-04 DIAGNOSIS — Z3490 Encounter for supervision of normal pregnancy, unspecified, unspecified trimester: Secondary | ICD-10-CM

## 2016-02-04 LAB — CBC
HCT: 37.8 % (ref 36.0–46.0)
Hemoglobin: 12.3 g/dL (ref 12.0–15.0)
MCH: 24.6 pg — ABNORMAL LOW (ref 26.0–34.0)
MCHC: 32.5 g/dL (ref 30.0–36.0)
MCV: 75.4 fL — ABNORMAL LOW (ref 78.0–100.0)
Platelets: 289 10*3/uL (ref 150–400)
RBC: 5.01 MIL/uL (ref 3.87–5.11)
RDW: 28.5 % — ABNORMAL HIGH (ref 11.5–15.5)
WBC: 9.5 10*3/uL (ref 4.0–10.5)

## 2016-02-04 LAB — TYPE AND SCREEN
ABO/RH(D): A POS
Antibody Screen: NEGATIVE

## 2016-02-04 MED ORDER — OXYTOCIN 40 UNITS IN LACTATED RINGERS INFUSION - SIMPLE MED
1.0000 m[IU]/min | INTRAVENOUS | Status: DC
  Administered 2016-02-04: 1 m[IU]/min via INTRAVENOUS
  Filled 2016-02-04: qty 1000

## 2016-02-04 MED ORDER — OXYTOCIN BOLUS FROM INFUSION: 500.0000 mL | INTRAVENOUS | Status: DC

## 2016-02-04 MED ORDER — WITCH HAZEL-GLYCERIN EX PADS: 1.0000 "application " | MEDICATED_PAD | CUTANEOUS | Status: DC | PRN

## 2016-02-04 MED ORDER — OXYCODONE-ACETAMINOPHEN 5-325 MG PO TABS: 2.0000 | ORAL_TABLET | ORAL | Status: DC | PRN

## 2016-02-04 MED ORDER — SENNOSIDES-DOCUSATE SODIUM 8.6-50 MG PO TABS
2.0000 | ORAL_TABLET | ORAL | Status: DC
  Administered 2016-02-04 – 2016-02-05 (×2): 2 via ORAL
  Filled 2016-02-04 (×2): qty 2

## 2016-02-04 MED ORDER — LACTATED RINGERS IV SOLN: 500.0000 mL | Freq: Once | INTRAVENOUS | Status: DC

## 2016-02-04 MED ORDER — EPHEDRINE 5 MG/ML INJ
10.0000 mg | INTRAVENOUS | Status: DC | PRN
  Filled 2016-02-04: qty 2

## 2016-02-04 MED ORDER — COCONUT OIL OIL
1.0000 | TOPICAL_OIL | Status: DC | PRN
Start: 2016-02-04 — End: 2016-02-06

## 2016-02-04 MED ORDER — TETANUS-DIPHTH-ACELL PERTUSSIS 5-2.5-18.5 LF-MCG/0.5 IM SUSP: 0.5000 mL | Freq: Once | INTRAMUSCULAR | Status: DC

## 2016-02-04 MED ORDER — OXYCODONE-ACETAMINOPHEN 5-325 MG PO TABS
1.0000 | ORAL_TABLET | ORAL | Status: DC | PRN
  Administered 2016-02-05 (×2): 1 via ORAL
  Filled 2016-02-04 (×2): qty 1

## 2016-02-04 MED ORDER — METHYLERGONOVINE MALEATE 0.2 MG/ML IJ SOLN: 0.2000 mg | INTRAMUSCULAR | Status: DC | PRN

## 2016-02-04 MED ORDER — PHENYLEPHRINE 40 MCG/ML (10ML) SYRINGE FOR IV PUSH (FOR BLOOD PRESSURE SUPPORT)
80.0000 ug | PREFILLED_SYRINGE | INTRAVENOUS | Status: DC | PRN
  Filled 2016-02-04: qty 5

## 2016-02-04 MED ORDER — IBUPROFEN 600 MG PO TABS
600.0000 mg | ORAL_TABLET | Freq: Four times a day (QID) | ORAL | Status: DC
  Administered 2016-02-04 – 2016-02-06 (×7): 600 mg via ORAL
  Filled 2016-02-04 (×7): qty 1

## 2016-02-04 MED ORDER — OXYTOCIN 40 UNITS IN LACTATED RINGERS INFUSION - SIMPLE MED: 2.5000 [IU]/h | INTRAVENOUS | Status: DC

## 2016-02-04 MED ORDER — MEDROXYPROGESTERONE ACETATE 150 MG/ML IM SUSP
150.0000 mg | INTRAMUSCULAR | Status: AC | PRN
  Administered 2016-02-06: 150 mg via INTRAMUSCULAR
  Filled 2016-02-04: qty 1

## 2016-02-04 MED ORDER — DIPHENHYDRAMINE HCL 50 MG/ML IJ SOLN: 12.5000 mg | INTRAMUSCULAR | Status: DC | PRN

## 2016-02-04 MED ORDER — LIDOCAINE HCL (PF) 1 % IJ SOLN
30.0000 mL | INTRAMUSCULAR | Status: DC | PRN
  Filled 2016-02-04: qty 30

## 2016-02-04 MED ORDER — ONDANSETRON HCL 4 MG PO TABS: 4.0000 mg | ORAL_TABLET | ORAL | Status: DC | PRN

## 2016-02-04 MED ORDER — BENZOCAINE-MENTHOL 20-0.5 % EX AERO: 1.0000 "application " | INHALATION_SPRAY | CUTANEOUS | Status: DC | PRN

## 2016-02-04 MED ORDER — FENTANYL 2.5 MCG/ML BUPIVACAINE 1/10 % EPIDURAL INFUSION (WH - ANES)
14.0000 mL/h | INTRAMUSCULAR | Status: DC | PRN
  Administered 2016-02-04: 14 mL/h via EPIDURAL
  Filled 2016-02-04: qty 125

## 2016-02-04 MED ORDER — METHYLERGONOVINE MALEATE 0.2 MG PO TABS: 0.2000 mg | ORAL_TABLET | ORAL | Status: DC | PRN

## 2016-02-04 MED ORDER — TERBUTALINE SULFATE 1 MG/ML IJ SOLN
0.2500 mg | Freq: Once | INTRAMUSCULAR | Status: DC | PRN
  Filled 2016-02-04: qty 1

## 2016-02-04 MED ORDER — DIPHENHYDRAMINE HCL 25 MG PO CAPS
25.0000 mg | ORAL_CAPSULE | Freq: Four times a day (QID) | ORAL | Status: DC | PRN
  Administered 2016-02-05: 25 mg via ORAL
  Filled 2016-02-04: qty 1

## 2016-02-04 MED ORDER — SOD CITRATE-CITRIC ACID 500-334 MG/5ML PO SOLN: 30.0000 mL | ORAL | Status: DC | PRN

## 2016-02-04 MED ORDER — ONDANSETRON HCL 4 MG/2ML IJ SOLN: 4.0000 mg | Freq: Four times a day (QID) | INTRAMUSCULAR | Status: DC | PRN

## 2016-02-04 MED ORDER — ZOLPIDEM TARTRATE 5 MG PO TABS: 5.0000 mg | ORAL_TABLET | Freq: Every evening | ORAL | Status: DC | PRN

## 2016-02-04 MED ORDER — PRENATAL MULTIVITAMIN CH
1.0000 | ORAL_TABLET | Freq: Every day | ORAL | Status: DC
  Administered 2016-02-05 – 2016-02-06 (×2): 1 via ORAL
  Filled 2016-02-04 (×2): qty 1

## 2016-02-04 MED ORDER — DIBUCAINE 1 % RE OINT: 1.0000 "application " | TOPICAL_OINTMENT | RECTAL | Status: DC | PRN

## 2016-02-04 MED ORDER — ACETAMINOPHEN 325 MG PO TABS: 650.0000 mg | ORAL_TABLET | ORAL | Status: DC | PRN

## 2016-02-04 MED ORDER — OXYCODONE-ACETAMINOPHEN 5-325 MG PO TABS: 1.0000 | ORAL_TABLET | ORAL | Status: DC | PRN

## 2016-02-04 MED ORDER — LACTATED RINGERS IV SOLN
500.0000 mL | INTRAVENOUS | Status: DC | PRN
  Administered 2016-02-04: 500 mL via INTRAVENOUS

## 2016-02-04 MED ORDER — PHENYLEPHRINE 40 MCG/ML (10ML) SYRINGE FOR IV PUSH (FOR BLOOD PRESSURE SUPPORT)
80.0000 ug | PREFILLED_SYRINGE | INTRAVENOUS | Status: DC | PRN
  Filled 2016-02-04: qty 5
  Filled 2016-02-04: qty 10

## 2016-02-04 MED ORDER — FERROUS SULFATE 325 (65 FE) MG PO TABS
325.0000 mg | ORAL_TABLET | Freq: Two times a day (BID) | ORAL | Status: DC
  Administered 2016-02-05 – 2016-02-06 (×3): 325 mg via ORAL
  Filled 2016-02-04 (×3): qty 1

## 2016-02-04 MED ORDER — ONDANSETRON HCL 4 MG/2ML IJ SOLN: 4.0000 mg | INTRAMUSCULAR | Status: DC | PRN

## 2016-02-04 MED ORDER — LIDOCAINE HCL (PF) 1 % IJ SOLN
INTRAMUSCULAR | Status: DC | PRN
  Administered 2016-02-04 (×2): 6 mL

## 2016-02-04 MED ORDER — LACTATED RINGERS IV SOLN
INTRAVENOUS | Status: DC
  Administered 2016-02-04 (×2): via INTRAVENOUS

## 2016-02-04 MED ORDER — SIMETHICONE 80 MG PO CHEW: 80.0000 mg | CHEWABLE_TABLET | ORAL | Status: DC | PRN

## 2016-02-04 NOTE — Consult Note (Addendum)
[facility]  Delivery Note:  SVD    02/04/2016  9:13 PM  I was called to the delivery room at the request of the patient's obstetrician (Dr. Charlotta Newtonzan) for a CODE APGAR for 45 second shoulder dystocia.  PRENATAL HX:  This is a 20 y;o [id number] at 40 and 0/[redacted] weeks gestation who was admitted this afternoon for IOL due to Category II tracing.  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  She is GBS negative and had AROM x 2 hours with clear fluid.  Delivery complicated by shoulder dystocia for 45 seconds.    DELIVERY:  Per L and D staff, infant floppy and apenic at delivery, but began to cry spontaneously when placed on the warmer.  Upon NICU team arrival at 2 minutes of age, infant had good cry and tone.  Pulse oximeter applied and O2 saturations were in the 90s by 5 minutes.  APGARs 7 and 9.  Exam within normal limits.  After 5 minutes, baby left with nurse to assist parents with skin-to-skin care.   _____________________ Electronically Signed By: Maryan CharLindsey Tahj Njoku, MD Neonatologist

## 2016-02-04 NOTE — MAU Note (Signed)
UC's since early today. Worse now, hurts to walk. States GBS negative.

## 2016-02-04 NOTE — Anesthesia Procedure Notes (Signed)
Epidural Patient location during procedure: OB  Staffing Anesthesiologist: Kaylin Marcon  Preanesthetic Checklist Completed: patient identified, site marked, surgical consent, pre-op evaluation, timeout performed, IV checked, risks and benefits discussed and monitors and equipment checked  Epidural Patient position: sitting Prep: DuraPrep Patient monitoring: heart rate and blood pressure Approach: midline Location: L3-L4 Injection technique: LOR saline  Needle:  Needle type: Tuohy  Needle gauge: 17 G Needle length: 9 cm Needle insertion depth: 6 cm Catheter type: closed end flexible Catheter size: 19 Gauge Catheter at skin depth: 12 cm Test dose: negative and Other  Assessment Events: blood not aspirated, injection not painful, no injection resistance, negative IV test and no paresthesia  Additional Notes Reason for block:procedure for pain   

## 2016-02-04 NOTE — Anesthesia Preprocedure Evaluation (Signed)
Anesthesia Evaluation  Patient identified by MRN, date of birth, ID band Patient awake    Reviewed: Allergy & Precautions, NPO status , Patient's Chart, lab work & pertinent test results  Airway Mallampati: II  TM Distance: >3 FB Neck ROM: Full    Dental no notable dental hx.    Pulmonary former smoker,    Pulmonary exam normal breath sounds clear to auscultation       Cardiovascular negative cardio ROS Normal cardiovascular exam Rhythm:Regular Rate:Normal     Neuro/Psych PSYCHIATRIC DISORDERS Anxiety Depression negative neurological ROS     GI/Hepatic negative GI ROS, Neg liver ROS,   Endo/Other  negative endocrine ROS  Renal/GU Renal disease  negative genitourinary   Musculoskeletal negative musculoskeletal ROS (+)   Abdominal   Peds negative pediatric ROS (+)  Hematology  (+) anemia ,   Anesthesia Other Findings   Reproductive/Obstetrics negative OB ROS                             Anesthesia Physical Anesthesia Plan  ASA: II  Anesthesia Plan: Epidural   Post-op Pain Management:    Induction: Intravenous  Airway Management Planned: Natural Airway  Additional Equipment:   Intra-op Plan:   Post-operative Plan:   Informed Consent: I have reviewed the patients History and Physical, chart, labs and discussed the procedure including the risks, benefits and alternatives for the proposed anesthesia with the patient or authorized representative who has indicated his/her understanding and acceptance.   Dental advisory given  Plan Discussed with: CRNA  Anesthesia Plan Comments: (Informed consent obtained prior to proceeding including risk of failure, 1% risk of PDPH, risk of minor discomfort and bruising.  Discussed rare but serious complications including epidural abscess, permanent nerve injury, epidural hematoma.  Discussed alternatives to epidural analgesia and patient desires  to proceed.  Timeout performed pre-procedure verifying patient name, procedure, and platelet count.  Patient tolerated procedure well. )        Anesthesia Quick Evaluation

## 2016-02-04 NOTE — H&P (Signed)
Enzo Bithena L Risinger is a 20 y.o. female, [id number] at 6345w0d admitted for Cat II tracing.  Pt also reporting painful contractions since this am, but they have spaced out.  Pregnancy complicated by:  1) Iron deficiency anemia- s/p transfusion on 12/21/15, followed by Hematology.  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 Infection     UTI  . UTI (lower urinary tract infection)   . Anxiety   . Anemia   . Pyelonephritis   . Chronic back pain   . Depression   . Post partum depression    Past Surgical History  Procedure Laterality Date  . Induced abortion    . Dilation and curettage of uterus    . Wisdom tooth extraction     Family History: family history includes Arthritis in her maternal grandmother and mother; Asthma in her son. There is no history of Alcohol abuse. Social History:  reports that she quit smoking about 2 years ago. She has never used smokeless tobacco. She reports that she does not drink alcohol or use illicit drugs.   Prenatal Transfer Tool  Maternal Diabetes: No Genetic Screening: Normal Maternal Ultrasounds/Referrals: Normal Fetal Ultrasounds or other Referrals:  None Maternal Substance Abuse:  No Significant Maternal Medications:  None Significant Maternal Lab Results: Lab values include: Group B Strep negative   ROS:  No headache, no blurry vision, no RUQ pain  No Known Allergies   O: BP 124/71 mmHg  Pulse 84  Temp(Src) 98 F (36.7 C) (Oral)  Resp 20  Ht 5\' 5"  (1.651 m)  Wt 83.915 kg (185 lb)  BMI 30.79 kg/m2  LMP 04/23/2015   GEN: NAD Heart RRR  Abd gravid, NT GU: No external or internal lesions noted Ext: no edema, no calf  tenderness bilaterally  FHR: 145, moderate variability, +accels, variable decel x 1  UCs:  irregular  Dilation: 5 Effacement (%): 70 Station: -2 Exam by:: dr Charlotta Newtonozan Blood pressure 124/71, pulse 84, temperature 98 F (36.7 C), temperature source Oral, resp. rate 20, height 5\' 5"  (1.651 m), weight 83.915 kg (185 lb), last menstrual period 04/23/2015, not currently breastfeeding.   Prenatal labs: ABO, Rh: --/--/A POS (06/17 1639) Antibody: PENDING (06/17 1639) Rubella: immune RPR: Nonreactive, Nonreactive (04/10 0000)  HBsAg: Negative, Negative (04/10 0000)  HIV: Non-reactive, Non-reactive (04/10 0000)  GBS:  negative  Assessment/Plan: 20yo [id number] @ 7945w0d admitted for IOL due to Cat. II  -FWB: Cat. II- due to variable decel, FHT reassuring, no decels currently -Labor: plan for Pit per protocol -Pain management: epidural -HSV2- no outbreaks, ok to proceed with delivery -CBC, T&S to be obtained   Myna HidalgoOZAN, Ulmer Degen, M 02/04/2016, 5:44 PM

## 2016-02-04 NOTE — Progress Notes (Addendum)
Assuming care of Melanie Hancock, 20 yo [id number] @ 40.2 wks admitted in latent labor and for Cat 2 tracing. Family at bedside.  Subjective: Shaking. Somewhat comfortable w/ epidural. Just received a PCA bolus. +FM. Denies VB or LOF.  Objective: BP 114/70 mmHg  Pulse 78  Temp(Src) 98 F (36.7 C) (Oral)  Resp 20  Ht 5\' 5"  (1.651 m)  Wt 83.915 kg (185 lb)  BMI 30.79 kg/m2  SpO2 100%  LMP 04/23/2015 Filed Vitals:   02/04/16 1901 02/04/16 1930  BP: 119/59 114/70  Pulse: 75 78  Temp:    Resp:     FHT: BL 135 w/ moderate variability, +accels, occ variables, no lates UC:   irregular, every 2-3 minutes SVE:   Dilation: 5.5 Effacement (%): 90 Station: -2 Exam by:: Melanie Hancock Melanie Hancock CNM@ 1950 AROM'd, moderate amount of clear fluid Pitocin at 3 mU/min  Assessment:  IUP at 40.2 wks Admitted for Cat 2 tracing -- overall reassuring GBS neg  Plan: Continue Pitocin. Monitor closely. Consider IUPC at next check if no progress.  Expect progress and SVD.  Melanie Hancock, Melanie Hancock CNM 02/04/2016, 7:55 PM

## 2016-02-05 LAB — CBC
HCT: 33.6 % — ABNORMAL LOW (ref 36.0–46.0)
Hemoglobin: 10.8 g/dL — ABNORMAL LOW (ref 12.0–15.0)
MCH: 24.2 pg — ABNORMAL LOW (ref 26.0–34.0)
MCHC: 32.1 g/dL (ref 30.0–36.0)
MCV: 75.3 fL — ABNORMAL LOW (ref 78.0–100.0)
Platelets: 251 10*3/uL (ref 150–400)
RBC: 4.46 MIL/uL (ref 3.87–5.11)
RDW: 28.5 % — ABNORMAL HIGH (ref 11.5–15.5)
WBC: 10.8 10*3/uL — ABNORMAL HIGH (ref 4.0–10.5)

## 2016-02-05 LAB — RPR: RPR Ser Ql: NONREACTIVE

## 2016-02-05 NOTE — Lactation Note (Signed)
This note was copied from a baby's chart. Lactation Consultation Note  Initial visit made.  Breastfeeding consultation services and support information.  This is mom's third baby and she breastfed her previous babies without problems.  Mom states newborn is feeding frequently.  Instructed to feed with any cue and to call for assist/concerns prn.  Patient Name: Melanie Hancock [id number]'s Date: 02/05/2016 Reason for consult: Initial assessment   Maternal Data Does the patient have breastfeeding experience prior to this delivery?: Yes  Feeding    LATCH Score/Interventions                      Lactation Tools Discussed/Used     Consult Status Consult Status: Follow-up Date: 02/06/16 Follow-up type: In-patient    Huston FoleyMOULDEN, Elia Keenum S 02/05/2016, 3:20 PM

## 2016-02-05 NOTE — Progress Notes (Signed)
Assisted patient out of bed into the bathroom with minimal to no assistance. Instructed on the use of the peribottle, patient cleaned perineum after voiding 600 of clear yellow urine. Ambulated back into bed with minimal to no assistance.

## 2016-02-05 NOTE — Anesthesia Postprocedure Evaluation (Signed)
Anesthesia Post Note  Patient: Melanie Hancock  Procedure(s) Performed: * No procedures listed *  Patient location during evaluation: Mother Baby Anesthesia Type: Epidural Level of consciousness: awake Pain management: satisfactory to patient Vital Signs Assessment: post-procedure vital signs reviewed and stable Respiratory status: spontaneous breathing Cardiovascular status: stable Anesthetic complications: no     Last Vitals:  Filed Vitals:   02/04/16 2230 02/04/16 2335  BP: 109/60 107/59  Pulse: 69 71  Temp: 36.7 C 36.7 C  Resp: 18 18    Last Pain:  Filed Vitals:   02/05/16 1139  PainSc: 8    Pain Goal: Patients Stated Pain Goal: 1 (02/04/16 1920)               Cephus ShellingBURGER,Cletis Muma

## 2016-02-05 NOTE — Clinical Social Work Maternal (Signed)
Casimiro Needle Nataniel Gasper, LCSW Social Worker Signed Psychiatry Clinical Social Work Maternal 02/05/2016 3:40 PM    Expand All Collapse All    CLINICAL SOCIAL WORK MATERNAL/CHILD NOTE  Patient Details  Name: Melanie Hancock MRN: [id number] Date of Birth: 02/04/2018  Date: 02/05/2016  Clinical Social Worker Initiating Note: Erasmo Downer Serrena Linderman, LCSWDate/ Time Initiated: 02/05/16/    Child's Name: Unknown   Legal Guardian: Mother   Need for Interpreter: None   Date of Referral: 02/04/16   Reason for Referral: Behavioral Health Issues, including SI    Referral Source: Physician   Address: [address] [place] [address]  Phone number: [phone]   Household Members: Self   Natural Supports (not living in the home): Immediate Family   Professional Supports:Therapist   Employment:Unemployed   Type of Work:     Education: Chiropractor Resources:Medicaid   Other Resources:     Cultural/Religious Considerations Which May Impact Care: None indicated  Strengths: Compliance with medical plan , Understanding of illness, Ability to meet basic needs    Risk Factors/Current Problems: Mental Health Concerns    Cognitive State: Alert , Able to Concentrate    Mood/Affect: Calm , Comfortable    CSW Assessment:CSW consulted for history of postpartum depression and other children not living with her. CSW met with patient and cousin who was at the bedside. Patient provided consent to speak in front of cousin. 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 She reports that she is currently seeing a therapist named Benjamine Mola at the [facility] and finds it helpful. She is planning on scheduling an upcoming appointment in the near future to resume therapy. She lives alone in her home as her 22 year old and 39 month old live with family. FOB is involved, patient identifies him as  supportive. She is unemployed but is hopeful to start working soon. She has some basic supplies for the baby but is interested in any available resources. CSW informed her about the [employer] through [facility], and also provided education on PPD. Patient has no further questions or concerns at this time.   CSW Plan/Description: Information/Referral to [employer] , No Further Intervention Required/No Barriers to Discharge    Zidane Renner, Casimiro Needle, LCSW 02/05/2016, 3:40 PM

## 2016-02-05 NOTE — Progress Notes (Signed)
Postpartum Note Day # 1  S:  Patient resting comfortable in bed.  Pain controlled.  Tolerating general diet. + flatus, no BM.  Lochia minimal.  Ambulating without difficulty.  She denies n/v/f/c, SOB, or CP.  Pt plans on breastfeeding.  O: Temp:  [97.2 F (36.2 C)-98 F (36.7 C)] 98 F (36.7 C) (06/17 2335) Pulse Rate:  [67-89] 71 (06/17 2335) Resp:  [18-20] 18 (06/17 2335) BP: (94-131)/(41-93) 107/59 mmHg (06/17 2335) SpO2:  [98 %-100 %] 100 % (06/17 1900) Weight:  [83.915 kg (185 lb)] 83.915 kg (185 lb) (06/17 1719)\  Gen: A&Ox3, NAD CV: RRR, no MRG Resp: CTAB Abdomen: soft, NT, ND Uterus: firm, non-tender, below umbilicus Ext: No edema, no calf tenderness bilaterally  Labs:  Recent Labs  02/04/16 1639 02/05/16 0528  HGB 12.3 10.8*    A/P: Pt is a 20 y.o. [id number] s/p NSVD, PPD#1  - Pain well controlled -GU: UOP is adequate -GI: Tolerating general diet -Activity: encouraged sitting up to chair and ambulation as tolerated -Prophylaxis: early ambulation -Anemia: Pt to continue with iron daily  Continue routine postpartum care, plan for discharge home 6/19.  Myna HidalgoJennifer Edwin Baines, DO [phone] (pager) [phone] (office)

## 2016-02-06 MED ORDER — OXYCODONE-ACETAMINOPHEN 5-325 MG PO TABS: 1.0000 | ORAL_TABLET | ORAL | Status: DC | PRN

## 2016-02-06 MED ORDER — IBUPROFEN 600 MG PO TABS: 600.0000 mg | ORAL_TABLET | Freq: Four times a day (QID) | ORAL | Status: DC | PRN

## 2016-02-06 NOTE — Lactation Note (Signed)
This note was copied from a baby's chart. Lactation Consultation Note  Patient Name: Melanie Hancock Today's Date: 02/06/2016  baby 37 hours , 4% weight loss, Bili @34  4.1.  Breast feeding consistently 15 -plus minutes . Latch scores - 9-8-9-9-8  Voids and stools qs for age.  @ consult baby latched - mom independent w/ latch.  Multiply swallows noted. Mom denies soreness , sore nipple and engorgement  Prevention and tx. Reviewed.  Per mom has DEBP @ home.  Mother informed of post-discharge support and given phone number to the lactation department,  including services for phone call assistance; out-patient appointments; and breastfeeding support group.  List of other breastfeeding resources in the community given in the handout. Encouraged mother to call for  problems or concerns related to breastfeeding.   Maternal Data    Feeding Feeding Type: Breast Fed Length of feed: 10 min (per mom )  LATCH Score/Interventions Latch: Grasps breast easily, tongue down, lips flanged, rhythmical sucking. Intervention(s): Adjust position;Assist with latch;Breast compression  Audible Swallowing: Spontaneous and intermittent Intervention(s): Skin to skin;Hand expression  Type of Nipple: Everted at rest and after stimulation Intervention(s): No intervention needed  Comfort (Breast/Nipple): Filling, red/small blisters or bruises, mild/mod discomfort  Problem noted: Mild/Moderate discomfort Interventions (Filling): Frequent nursing Interventions (Mild/moderate discomfort): Hand expression  Hold (Positioning): No assistance needed to correctly position infant at breast. Intervention(s): Breastfeeding basics reviewed;Support Pillows;Position options;Skin to skin  LATCH Score: 9  Lactation Tools Discussed/Used     Consult Status      [name], [name] 02/06/2016, 10:36 AM

## 2016-02-06 NOTE — Discharge Instructions (Signed)

## 2016-02-06 NOTE — Discharge Summary (Signed)
[facility]   Patient Name:   Melanie Hancock DOB:     October 13, 1995 MRN:     [id number]  Date of Admission:   02/04/2016 Date of Discharge:  02/06/2016  Admitting diagnosis:    40 WKS, CTXS, PAIN, NAUSEA Principal Problem:   Vaginal delivery  Additional Diagnoses:    Discharge diagnoses:    40 WKS, CTXS, PAIN, NAUSEA Principal Problem:   Vaginal delivery  Additional diagnoses:                                                                Post partum procedures: none  Type of Delivery: SVD    Delivering Provider: Sherre ScarletWILLIAMS, KIMBERLY   Date of Delivery:  02/04/2016  Newborn Data:    Live born female  Birth Weight: 8 lb 4.1 oz (3745 g) APGAR: 7, 9  Baby's Name:   Baby Feeding:   Breast Disposition:   home with mother  Complications:   None  Hospital course:      Onset of Labor With Vaginal Delivery     20 y.o. yo [id number] at 6238w0d was admitted in Active Labor on 02/04/2016. Patient had an uncomplicated labor course as follows:  Membrane Rupture Time/Date: 7:48 PM ,02/04/2016   Intrapartum Procedures: Episiotomy: None [1]                                         Lacerations:  None [1]  Patient had a delivery of a Viable infant. 02/04/2016  Information for the patient's newborn:  Loel LoftyGraves, Boy Hazell [[id number]][[id number]]  Delivery Method: Vaginal, Spontaneous Delivery (Filed from Delivery Summary)    Pateint had an uncomplicated postpartum course.  She is ambulating, tolerating a regular diet, passing flatus, and urinating well. Patient is discharged home in stable condition on 02/06/2016.    Physical Exam:   Filed Vitals:   02/04/16 2230 02/04/16 2335 02/05/16 1711 02/06/16 0600  BP: 109/60 107/59 98/49 113/72  Pulse: 69 71 63 59  Temp: 98 F (36.7 C) 98 F (36.7 C) 98.3 F (36.8 C) 98.5 F (36.9 C)  TempSrc: Oral Oral Oral Oral  Resp: 18 18 18 18   Height:      Weight:      SpO2:       General: alert Lochia: appropriate Uterine  Fundus: firm Incision: N/A DVT Evaluation: No evidence of DVT seen on physical exam.  Labs:@ CBC Latest Ref Rng 02/05/2016 02/04/2016 02/01/2016  WBC 4.0 - 10.5 K/uL 10.8(H) 9.5 6.3  Hemoglobin 12.0 - 15.0 g/dL 10.8(L) 12.3 10.8(L)  Hematocrit 36.0 - 46.0 % 33.6(L) 37.8 33.4(L)  Platelets 150 - 400 K/uL 251 289 215     Discharge instruction: per After Visit Summary and "Baby and Me Booklet".  After Visit Meds:    Medication List    STOP taking these medications        cyclobenzaprine 10 MG tablet  Commonly known as:  FLEXERIL     valACYclovir 1000 MG tablet  Commonly known as:  VALTREX      TAKE these medications        ferrous gluconate 324 MG tablet  Commonly known as:  FERGON  Take 648 mg by mouth at bedtime.     ibuprofen 600 MG tablet  Commonly known as:  ADVIL,MOTRIN  Take 1 tablet (600 mg total) by mouth every 6 (six) hours as needed for mild pain.     oxyCODONE-acetaminophen 5-325 MG tablet  Commonly known as:  PERCOCET/ROXICET  Take 1 tablet by mouth every 4 (four) hours as needed (pain scale 4-7).     prenatal multivitamin Tabs tablet  Take 1 tablet by mouth daily at 12 noon.        Diet: routine diet  Activity: Advance as tolerated. Pelvic rest for 6 weeks.   Outpatient follow up:6 weeks Follow up Appt:No future appointments. Follow up visit: No Follow-up on file.  Postpartum contraception: Depo Provera before discharge  02/06/2016 Nigel Bridgeman, CNM

## 2016-02-09 ENCOUNTER — Inpatient Hospital Stay (HOSPITAL_COMMUNITY): Admission: RE | Admit: 2016-02-09 | Payer: No Typology Code available for payment source | Source: Ambulatory Visit

## 2016-02-15 IMAGING — US US OB COMP LESS 14 WK
1 series · 14 of 28 positions shown · non-contrast
Comparison: Ob ultrasound 06/07/2014.

CLINICAL DATA: 19-year-old female pregnant patient complaining of
nausea and vomiting for 1 week.

EXAM:
OBSTETRIC <14 WK US AND TRANSVAGINAL OB US
TECHNIQUE: Both transabdominal and transvaginal ultrasound examinations were
performed for complete evaluation of the gestation as well as the
maternal uterus, adnexal regions, and pelvic cul-de-sac.
Transvaginal technique was performed to assess early pregnancy.

[Series 1: us ob comp less 14 wk · 0.11mm/px · 14 of 44 slices shown]
[im 2/44]
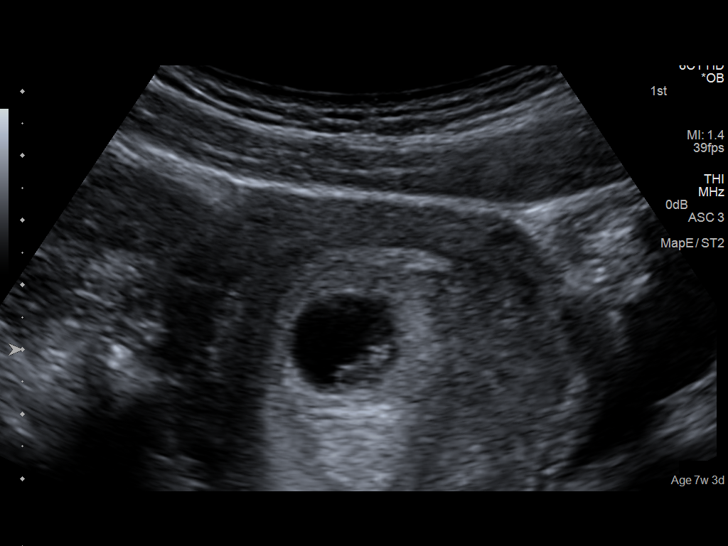
[im 5/44]
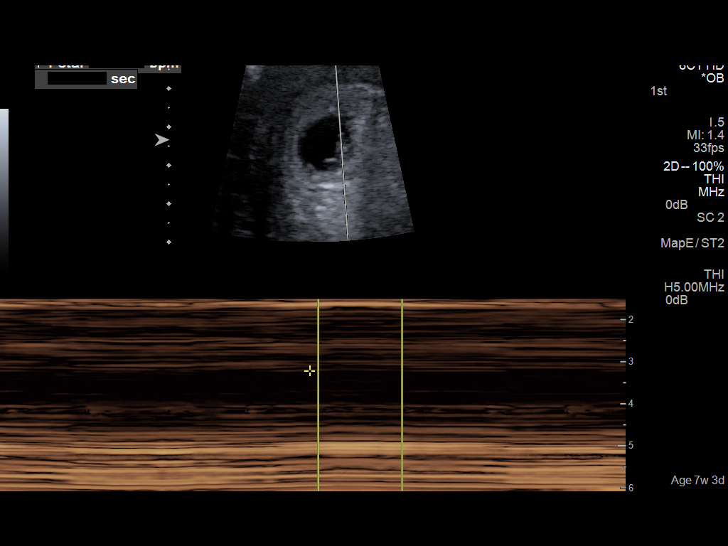
[im 8/44]
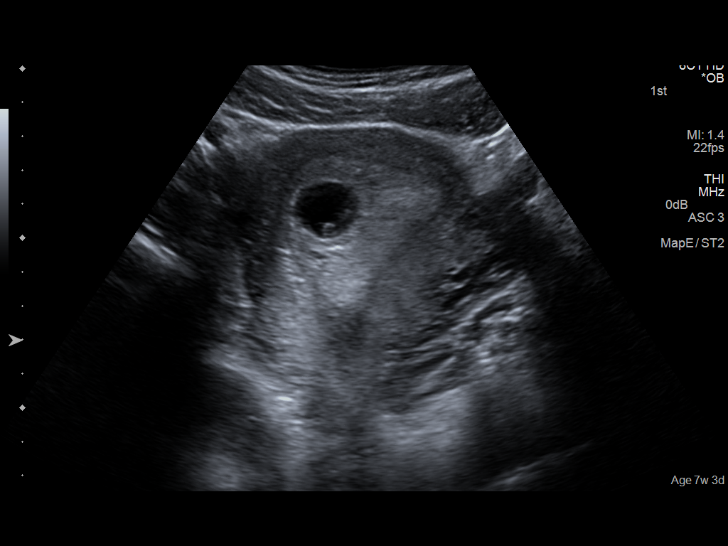
[im 12/44]
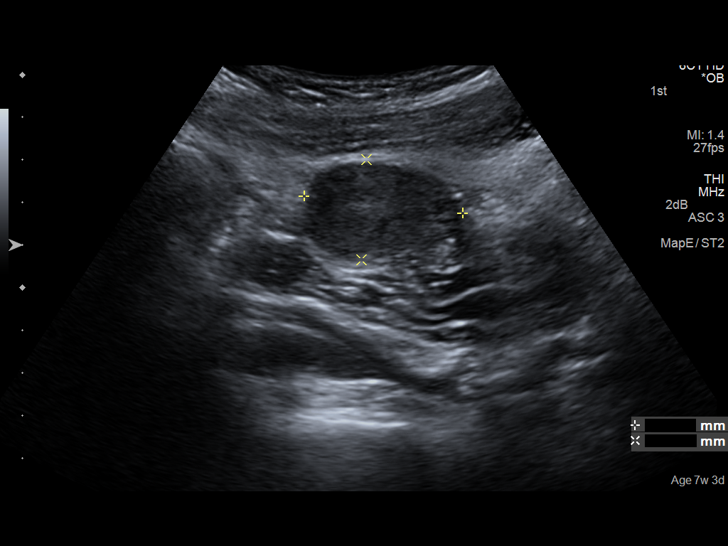
[im 15/44]
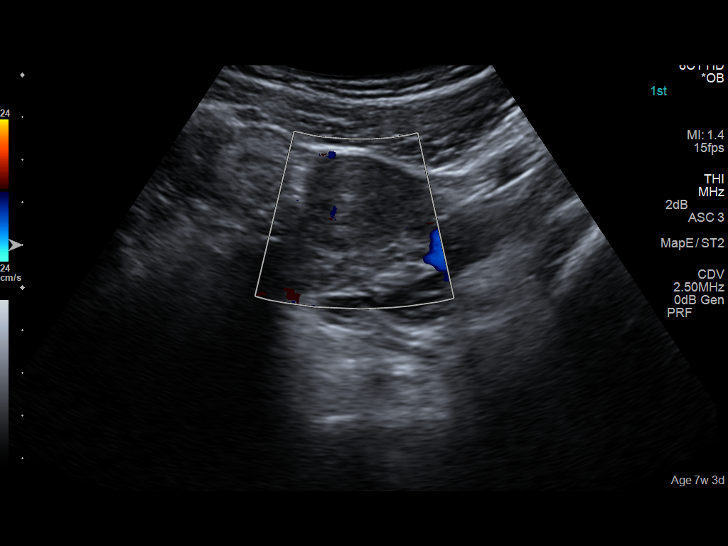
[im 18/44]
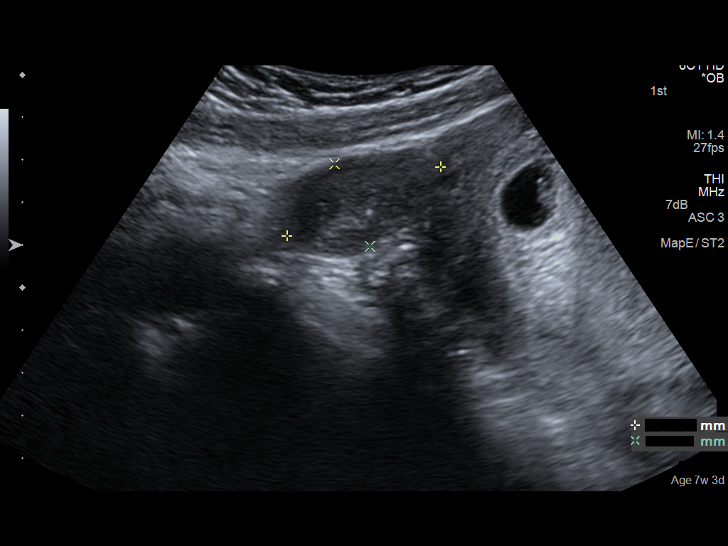
[im 21/44]
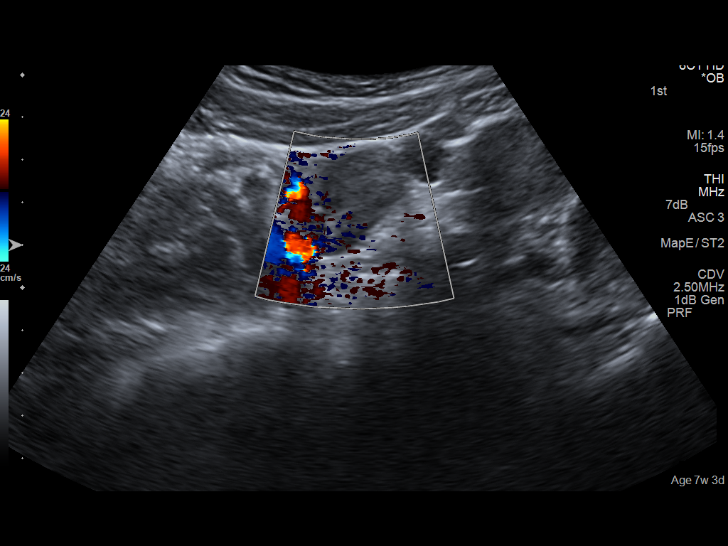
[im 24/44]
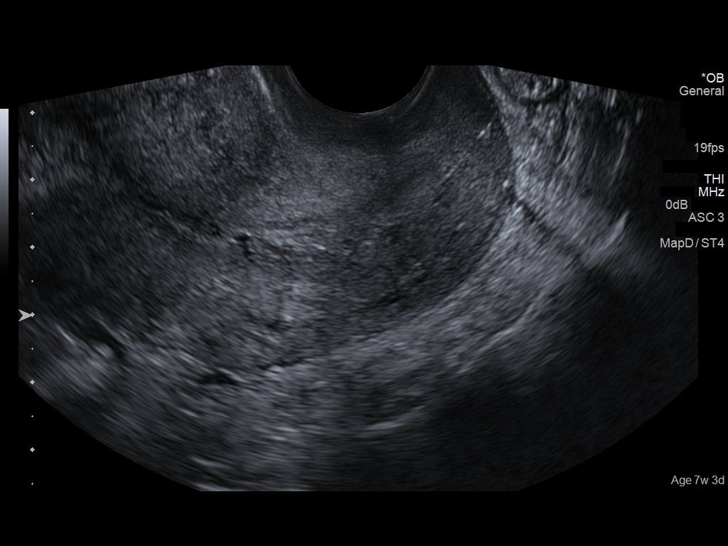
[im 28/44]
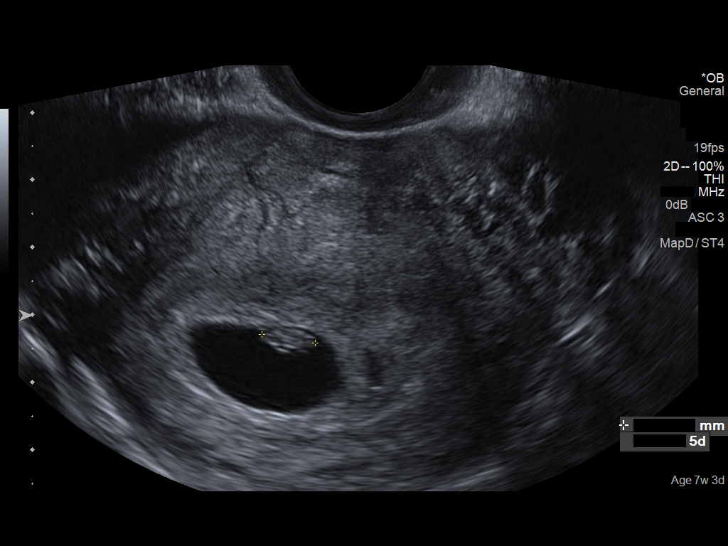
[im 31/44]
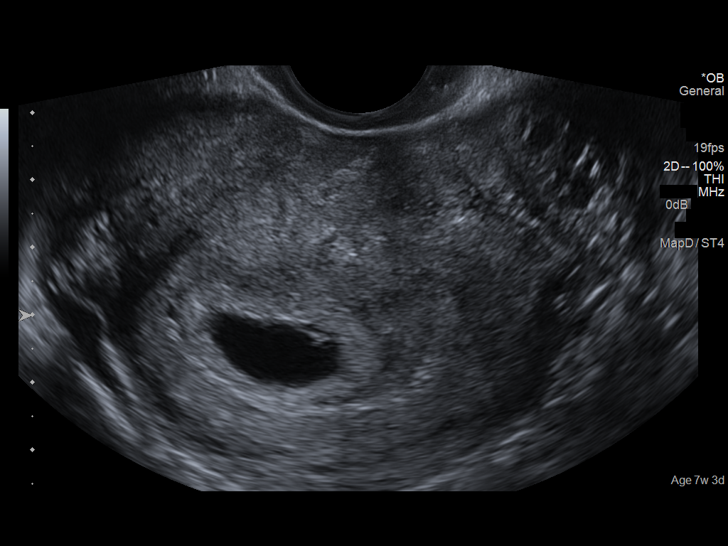
[im 34/44]
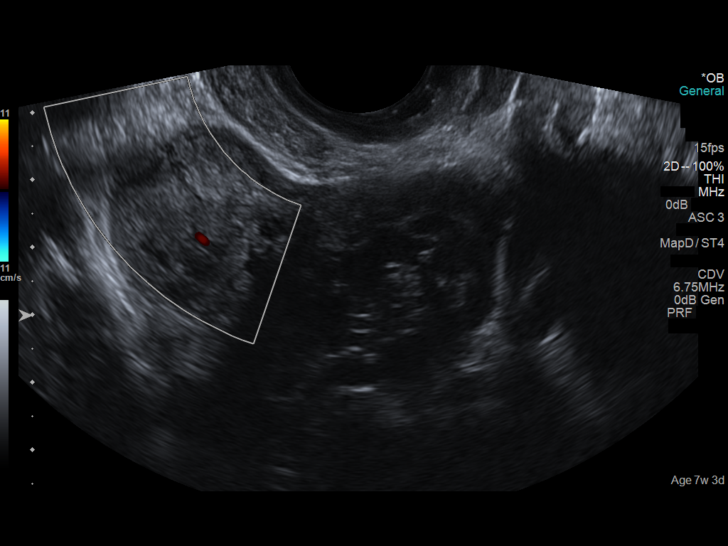
[im 37/44]
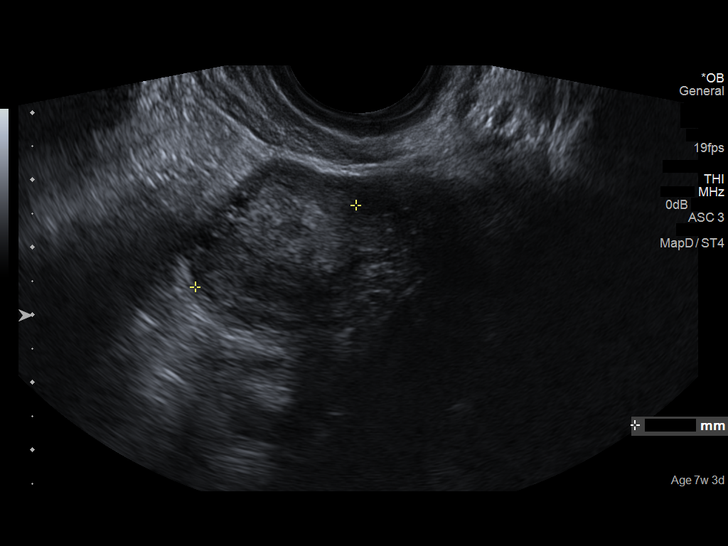
[im 40/44]
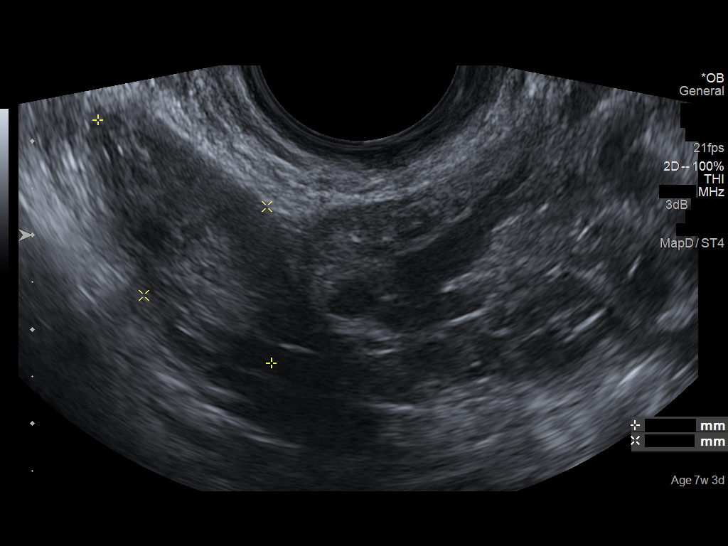
[im 44/44]
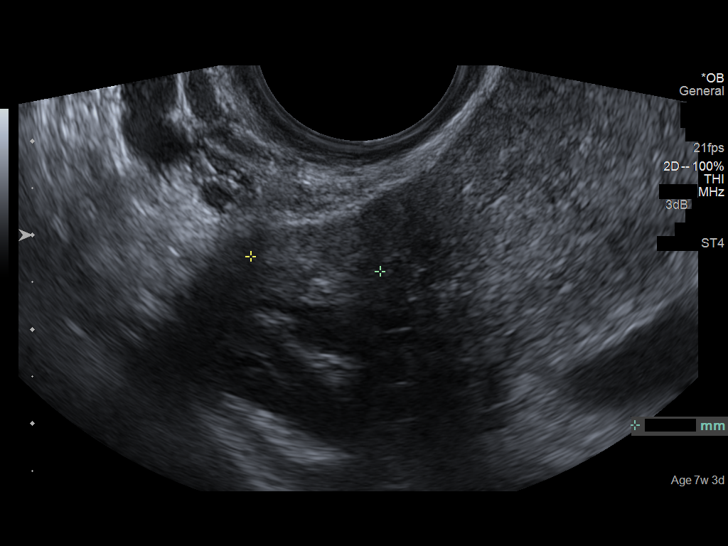

[14 of 28 positions shown; findings below may reference images not displayed]

FINDINGS: Intrauterine gestational sac: Single round gestational sac in the
fundal portion of the endometrial canal.

Yolk sac:  Present

Embryo:  Present

Cardiac Activity: Present

Heart Rate: 122  bpm

CRL:  8.0  mm   6 w   5 d                  US EDC: 02/02/2016

Maternal uterus/adnexae: No subchorionic hemorrhage or other acute
findings. Ovaries are normal in appearance bilaterally. No
significant free fluid in the cul-de-sac.
IMPRESSION: 1. Single viable IUP with estimated gestational age of 6 weeks and 5
days and fetal heart rate of 122 beats per minute.
2. No acute findings.

## 2016-02-17 IMAGING — US US ABDOMEN LIMITED
1 series · 14 of 25 positions shown · non-contrast
Comparison: None.

CLINICAL DATA: Acute epigastric abdominal pain.

EXAM:
US ABDOMEN LIMITED - RIGHT UPPER QUADRANT

[Series 1: us abdomen limited · 0.19mm/px · 14 of 46 slices shown]
[im 1/46]
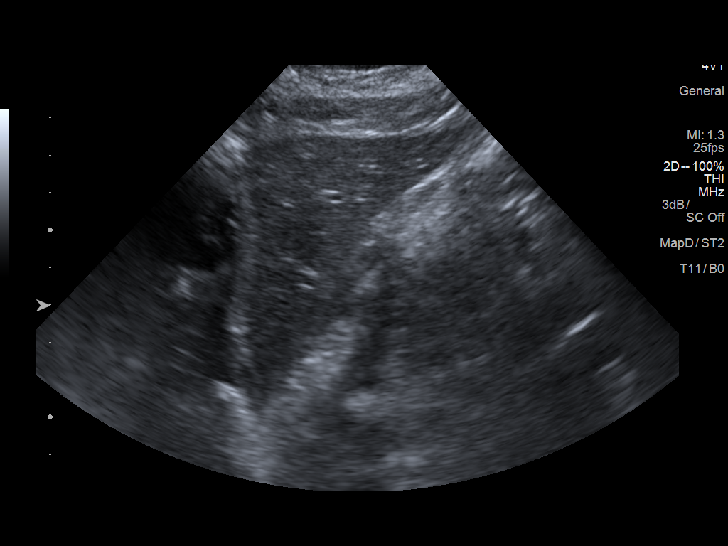
[im 4/46]
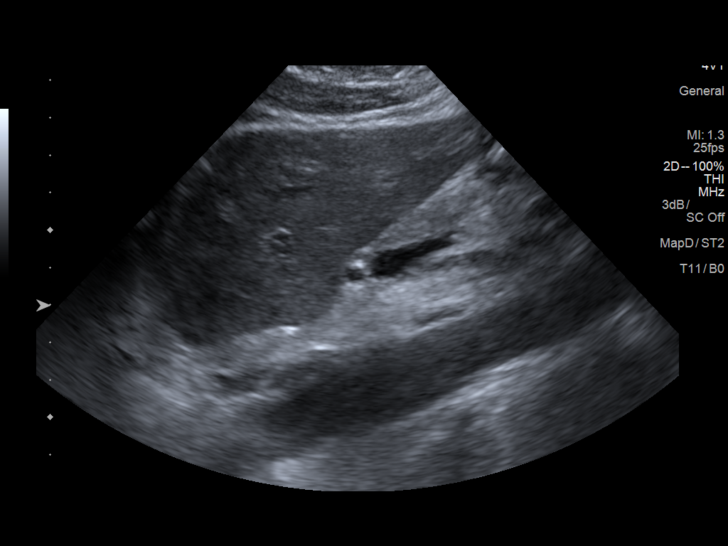
[im 8/46]
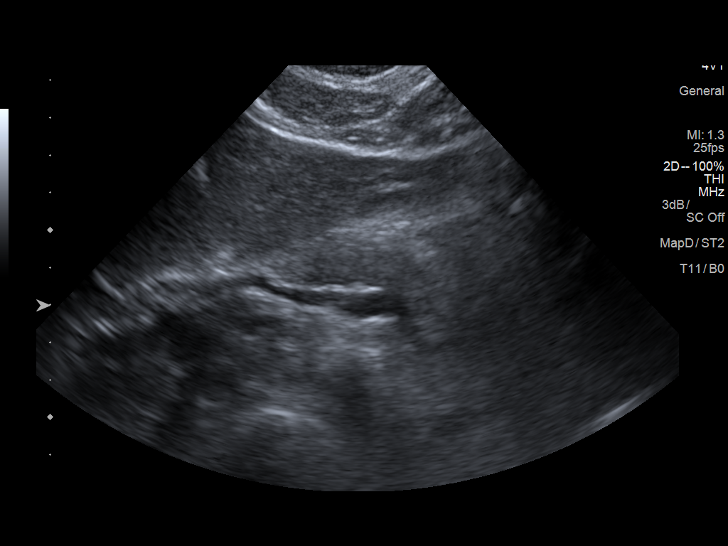
[im 12/46]
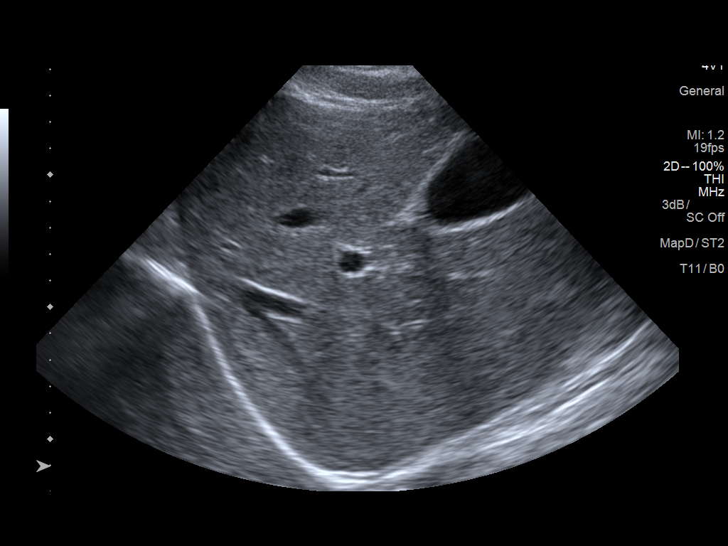
[im 16/46]
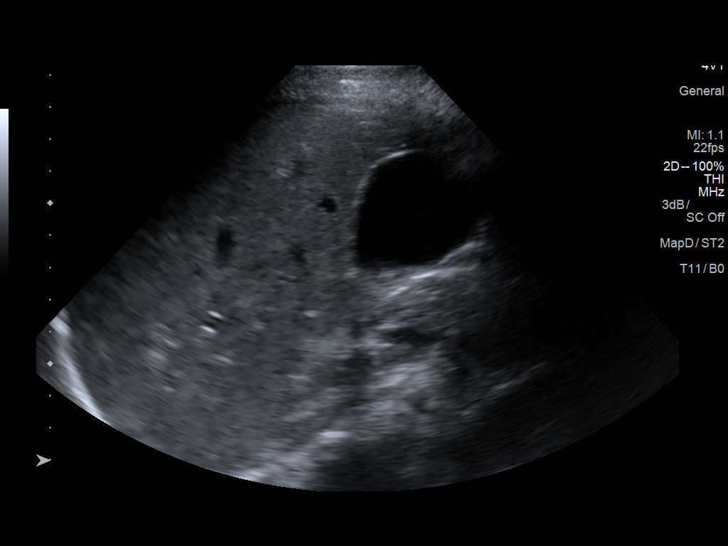
[im 17/46]
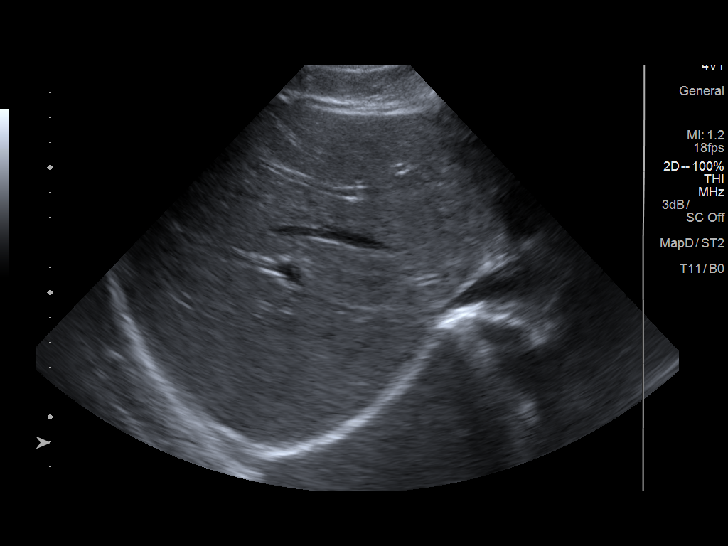
[im 21/46]
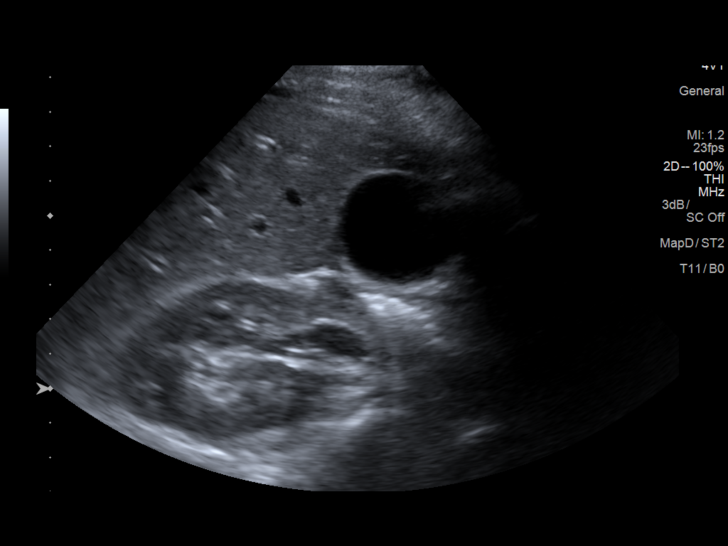
[im 25/46]
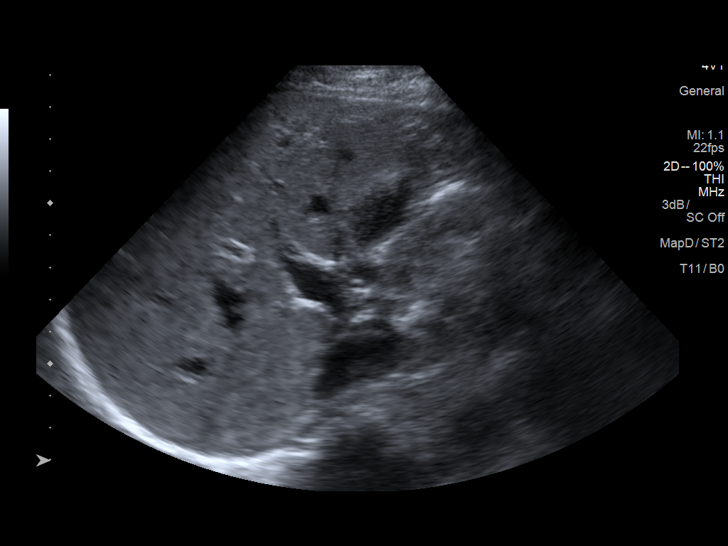
[im 29/46]
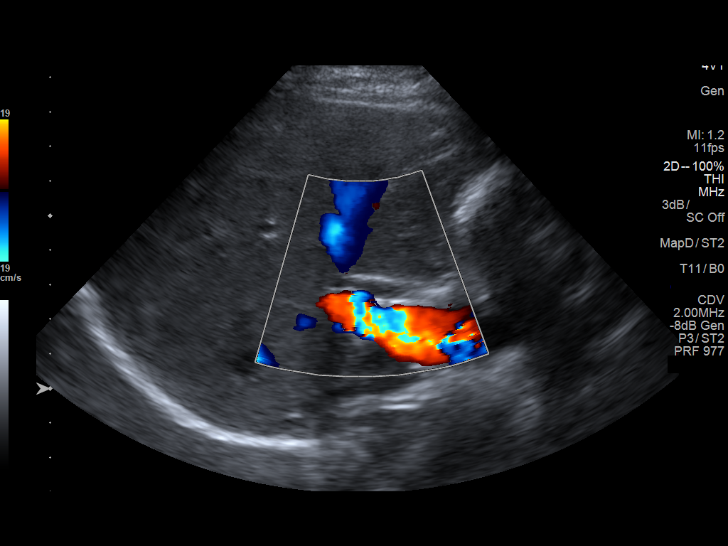
[im 31/46]
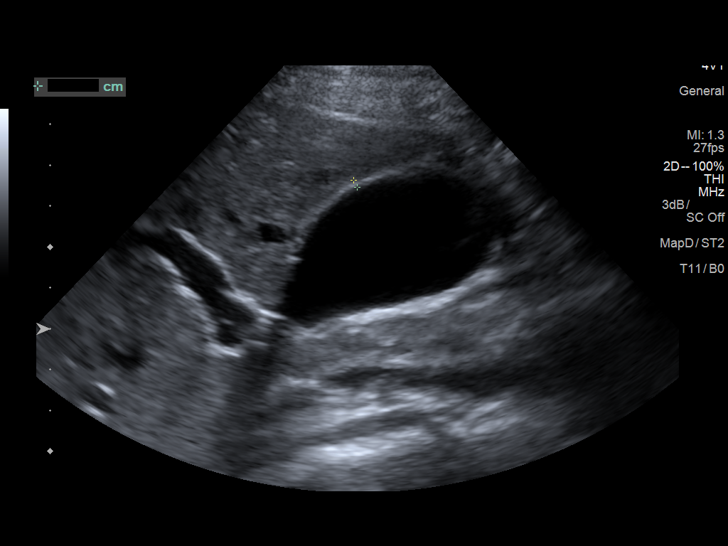
[im 34/46]
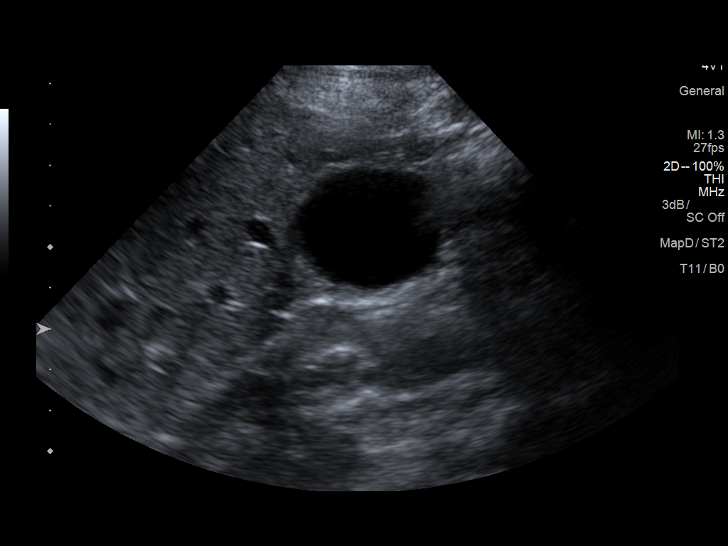
[im 38/46]
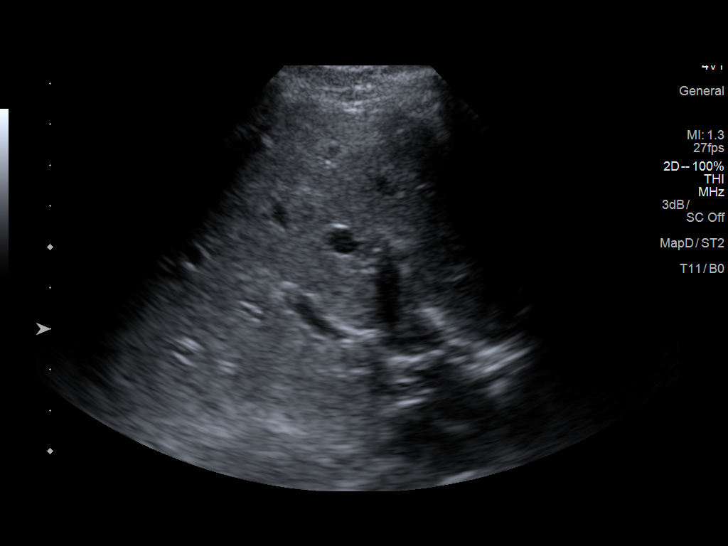
[im 42/46]
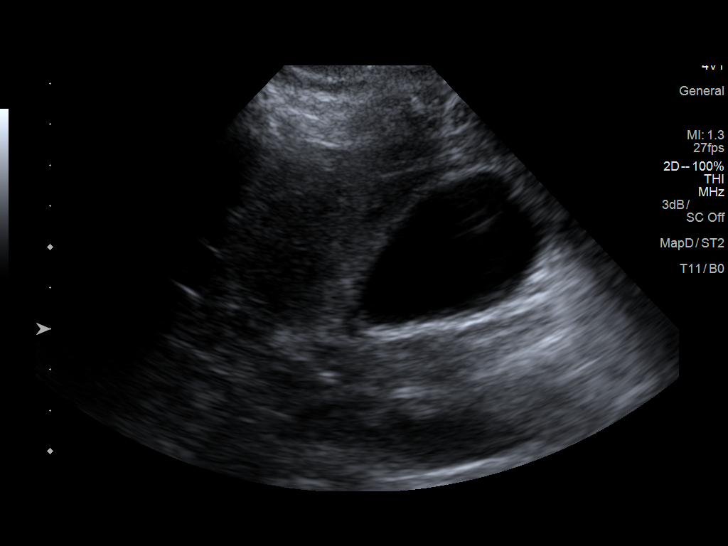
[im 46/46]
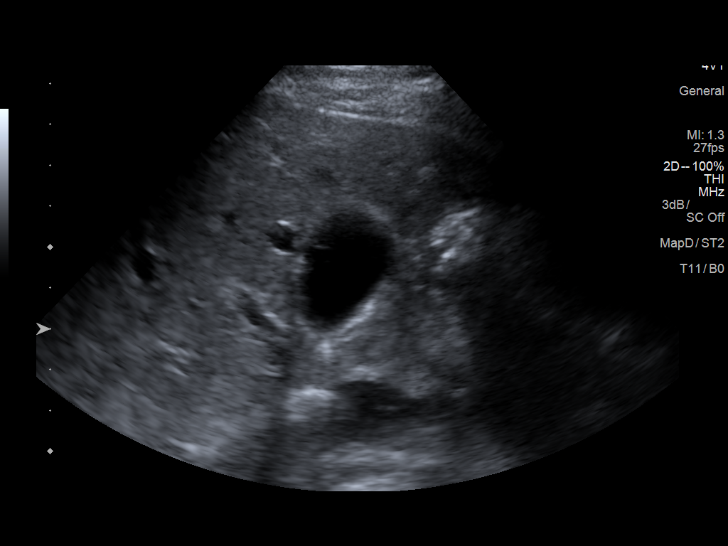

[14 of 25 positions shown; findings below may reference images not displayed]

FINDINGS: Gallbladder:

No gallstones or wall thickening visualized. No sonographic Murphy
sign noted.

Common bile duct:

Diameter: 3.5 mm which is within normal limits.

Liver:

No focal lesion identified. Within normal limits in parenchymal
echogenicity.
IMPRESSION: No abnormality seen in the right upper quadrant of the abdomen.

## 2016-04-24 ENCOUNTER — Emergency Department (HOSPITAL_BASED_OUTPATIENT_CLINIC_OR_DEPARTMENT_OTHER)
Admission: EM | Admit: 2016-04-24 | Discharge: 2016-04-24 | Disposition: A | Discharge status: Home / Self Care | Payer: Medicaid Other | Attending: Emergency Medicine | Admitting: Emergency Medicine

## 2016-04-24 ENCOUNTER — Encounter (HOSPITAL_BASED_OUTPATIENT_CLINIC_OR_DEPARTMENT_OTHER): Payer: Self-pay

## 2016-04-24 DIAGNOSIS — N760 Acute vaginitis: Secondary | ICD-10-CM | POA: Insufficient documentation

## 2016-04-24 DIAGNOSIS — N898 Other specified noninflammatory disorders of vagina: Secondary | ICD-10-CM | POA: Diagnosis present

## 2016-04-24 DIAGNOSIS — Z711 Person with feared health complaint in whom no diagnosis is made: Secondary | ICD-10-CM

## 2016-04-24 DIAGNOSIS — B9689 Other specified bacterial agents as the cause of diseases classified elsewhere: Secondary | ICD-10-CM

## 2016-04-24 DIAGNOSIS — Z87891 Personal history of nicotine dependence: Secondary | ICD-10-CM | POA: Diagnosis not present

## 2016-04-24 LAB — URINALYSIS, ROUTINE W REFLEX MICROSCOPIC
Bilirubin Urine: NEGATIVE
Glucose, UA: NEGATIVE mg/dL
Hgb urine dipstick: NEGATIVE
Ketones, ur: NEGATIVE mg/dL
Nitrite: NEGATIVE
Protein, ur: NEGATIVE mg/dL
Specific Gravity, Urine: 1.02 (ref 1.005–1.030)
pH: 8 (ref 5.0–8.0)

## 2016-04-24 LAB — URINE MICROSCOPIC-ADD ON

## 2016-04-24 LAB — WET PREP, GENITAL
Sperm: NONE SEEN
Trich, Wet Prep: NONE SEEN
Yeast Wet Prep HPF POC: NONE SEEN

## 2016-04-24 LAB — PREGNANCY, URINE: Preg Test, Ur: NEGATIVE

## 2016-04-24 MED ORDER — AZITHROMYCIN 1 G PO PACK
1.0000 g | PACK | Freq: Once | ORAL | Status: AC
  Administered 2016-04-24: 1 g via ORAL
  Filled 2016-04-24: qty 1

## 2016-04-24 MED ORDER — CEFTRIAXONE SODIUM 250 MG IJ SOLR
250.0000 mg | Freq: Once | INTRAMUSCULAR | Status: AC
  Administered 2016-04-24: 250 mg via INTRAMUSCULAR
  Filled 2016-04-24: qty 250

## 2016-04-24 MED ORDER — METRONIDAZOLE 0.75 % VA GEL: 1.0000 | Freq: Every day | VAGINAL | 0 refills | Status: DC

## 2016-04-24 NOTE — ED Provider Notes (Signed)
[facility] Provider Note   CSN: [id number] Arrival date & time: 04/24/16  1802  By signing my name below, I, Aggie Moats, attest that this documentation has been prepared under the direction and in the presence of Mattie Marlin, PA-C. Electronically signed by: Aggie Moats, ED Scribe. 04/24/16. 7:38 PM.  History   Chief Complaint Chief Complaint  Patient presents with  . Vaginal Discharge   The history is provided by the patient. No language interpreter was used.   HPI Comments:  Melanie Hancock is a 20 y.o. female who presents to the Emergency Department complaining of vaginal discharge and increased urinary frequency, which started 1 week ago. Discharge has yellow, brown and orange discharge. Associated symptoms include intermittent spotting, cloudy and malodorous urine, intermittent lower abdominal pain, frequent soft BM, nausea and anorexia. Denies burning with urination, fever, chills, vomiting, chest pain, SOB, abnormal back pain, numbness and tingling. Pt is sexually active and has a history of chlamydia and trichomoniasis.    Past Medical History:  Diagnosis Date  . Anemia   . Anxiety   . Chronic back pain   . Depression   . Infection    UTI  . Post partum depression   . Pyelonephritis   . UTI (lower urinary tract infection)     Patient Active Problem List   Diagnosis Date Noted  . Vaginal delivery 02/04/2016  . History of postpartum depression, currently pregnant 01/23/2016  . Iron deficiency anemia 01/02/2016  . 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 Anxiety 03/28/2014  . Chronic back pain 03/28/2014  . Hx pyelonephritis 2014 05/26/2013    Past Surgical History:  Procedure Laterality Date  . DILATION AND CURETTAGE OF UTERUS    . INDUCED ABORTION    . WISDOM TOOTH EXTRACTION      OB History    Gravida Para Term Preterm AB Living   5 3 3   2 3    SAB TAB Ectopic Multiple Live Births   1 1   0 3       Home Medications    Prior to Admission  medications   Medication Sig Start Date End Date Taking? Authorizing Provider  ibuprofen (ADVIL,MOTRIN) 600 MG tablet Take 1 tablet (600 mg total) by mouth every 6 (six) hours as needed for mild pain. 02/06/16   Nigel Bridgeman, CNM  metroNIDAZOLE (METROGEL VAGINAL) 0.75 % vaginal gel Place 1 Applicatorful vaginally at bedtime. Once daily for 5 days 04/24/16   Jerre Simon, PA  Prenatal Vit-Fe Fumarate-FA (PRENATAL MULTIVITAMIN) TABS tablet Take 1 tablet by mouth daily at 12 noon.    Historical Provider, MD    Family History Family History  Problem Relation Age of Onset  . Arthritis Mother   . Arthritis Maternal Grandmother   . Asthma Son   . Alcohol abuse Neg Hx     Social History Social History  Substance Use Topics  . Smoking status: Former Smoker    Quit date: 01/21/2014  . Smokeless tobacco: Never Used  . Alcohol use No     Allergies   Review of patient's allergies indicates no known allergies.   Review of Systems Review of Systems  Constitutional: Positive for appetite change. Negative for fever.  Respiratory: Negative for shortness of breath.   Cardiovascular: Negative for chest pain.  Gastrointestinal: Positive for abdominal pain and nausea. Negative for blood in stool and vomiting.       Frequent soft stools  Genitourinary: Positive for frequency and vaginal discharge. Negative  for dysuria.  Neurological: Negative for numbness.     Physical Exam Updated Vital Signs BP 121/68 (BP Location: Left Arm)   Pulse 60   Temp 98.6 F (37 C) (Oral)   Resp 16   Ht 5\' 5"  (1.651 m)   Wt 179 lb (81.2 kg)   SpO2 100%   Breastfeeding? No Comment: depo inj started 3 months ago  BMI 29.79 kg/m   Physical Exam  Constitutional: She appears well-developed and well-nourished. No distress.  HENT:  Head: Normocephalic and atraumatic.  Eyes: Conjunctivae are normal.  Cardiovascular: Normal rate, regular rhythm and normal heart sounds.   Pulmonary/Chest: Effort normal and  breath sounds normal. No respiratory distress.  Abdominal: Soft. Bowel sounds are normal. There is no tenderness.  Genitourinary:  Genitourinary Comments: Exam performed by Jerre SimonJessica L Focht,  exam chaperoned Date: 04/25/2016 Pelvic exam: normal external genitalia without evidence of trauma. VULVA: normal appearing vulva with no masses, tenderness or lesion. VAGINA: normal appearing vagina with normal color and discharge, no lesions. CERVIX: normal appearing cervix without lesions, cervical motion tenderness absent, cervical os closed with out purulent discharge; vaginal discharge - clear, mucoid, odorless and scant, Wet prep and DNA probe for chlamydia and GC obtained.   ADNEXA: normal adnexa in size, nontender and no masses UTERUS: uterus is normal size, shape, consistency and nontender.    Musculoskeletal: Normal range of motion. She exhibits no tenderness.  No CVA tenderness.  Neurological: She is alert. Coordination normal.  Skin: Skin is warm and dry. She is not diaphoretic.  Psychiatric: She has a normal mood and affect. Her behavior is normal.  Nursing note and vitals reviewed.    ED Treatments / Results  DIAGNOSTIC STUDIES:  Oxygen Saturation is 100% on room air, normal by my interpretation.    COORDINATION OF CARE:  7:35 PM Discussed treatment plan with pt at bedside and pt agreed to plan.  Labs (all labs ordered are listed, but only abnormal results are displayed) Labs Reviewed  WET PREP, GENITAL - Abnormal; Notable for the following:       Result Value   Clue Cells Wet Prep HPF POC PRESENT (*)    WBC, Wet Prep HPF POC FEW (*)    All other components within normal limits  URINALYSIS, ROUTINE W REFLEX MICROSCOPIC (NOT AT [facility]) - Abnormal; Notable for the following:    Leukocytes, UA SMALL (*)    All other components within normal limits  URINE MICROSCOPIC-ADD ON - Abnormal; Notable for the following:    Squamous Epithelial / LPF 0-5 (*)    Bacteria, UA MANY (*)     All other components within normal limits  URINE CULTURE  PREGNANCY, URINE  RPR  HIV ANTIBODY (ROUTINE TESTING)  GC/CHLAMYDIA PROBE AMP ([place]) NOT AT [facility]    EKG  EKG Interpretation None       Radiology No results found.  Procedures Procedures (including critical care time)  Medications Ordered in ED Medications  cefTRIAXone (ROCEPHIN) injection 250 mg (250 mg Intramuscular Given 04/24/16 2014)  azithromycin (ZITHROMAX) powder 1 g (1 g Oral Given 04/24/16 2014)     Initial Impression / Assessment and Plan / ED Course  I have reviewed the triage vital signs and the nursing notes.  Pertinent labs & imaging results that were available during my care of the patient were reviewed by me and considered in my medical decision making (see chart for details).  Clinical Course   Patient treated in the ED for STI  with Azithromycin and Rocephin. Patient advised to inform and treat all sexual partners.  Pt advised on safe sex practices and understands that they have GC/Chlamydia cultures pending and will result in 2-3 days. HIV and RPR sent. Wet prep revealed clue cells. Patient will be discharged with Flagyl and instructed not to drink alcohol with this medication. Pt encouraged to follow up at local health department for future STI checks. No concern for PID. Instructed patient to follow up with primary care provider if symptoms do not improve in 2 days. Discussed return precautions. Pt appears safe for discharge.   I personally performed the services described in this documentation, which was scribed in my presence. The recorded information has been reviewed and is accurate.    Final Clinical Impressions(s) / ED Diagnoses   Final diagnoses:  BV (bacterial vaginosis)  Concern about STD in female without diagnosis  Vaginal discharge    New Prescriptions Discharge Medication List as of 04/24/2016  8:27 PM    START taking these medications   Details  metroNIDAZOLE (METROGEL  VAGINAL) 0.75 % vaginal gel Place 1 Applicatorful vaginally at bedtime. Once daily for 5 days, Starting Tue 04/24/2016, Print         Joyce Copa [place], [place] 04/25/16 0128    Derwood Kaplan, MD 04/30/16 [phone]

## 2016-04-24 NOTE — Discharge Instructions (Signed)
You were treated in the ED for gonorrhea and chlamydia. Use the Flagyl applicator as prescribed and do not drink alcohol while using this medication. Your gonorrhea, chlamydia, HIV, syphilis testing is still pending. Inform your sexual partner(s) that you have been treated and that they should be tested and treated also. Use condoms to prevent STDs. Follow-up with your OB/GYN in 2 days if symptoms are not improving. Your urine culture is pending.  Return to emergency department if you experience vomiting, worsening vaginal discharge, blood in your vomit, worsening abdominal pain, fever, chills, blood in your urine or any other concerning symptoms.

## 2016-04-24 NOTE — ED Triage Notes (Addendum)
C/o vaginal d/c and spotting since vaginal delivery and depo injection (approx 3 mos ago)-dark urine and frequency-NAD-steady gait

## 2016-04-25 LAB — GC/CHLAMYDIA PROBE AMP (~~LOC~~) NOT AT ARMC
Chlamydia: NEGATIVE
Neisseria Gonorrhea: NEGATIVE

## 2016-04-26 LAB — URINE CULTURE

## 2016-04-26 LAB — RPR: RPR Ser Ql: NONREACTIVE

## 2016-04-26 LAB — HIV ANTIBODY (ROUTINE TESTING W REFLEX): HIV Screen 4th Generation wRfx: NONREACTIVE

## 2016-05-11 ENCOUNTER — Emergency Department (HOSPITAL_BASED_OUTPATIENT_CLINIC_OR_DEPARTMENT_OTHER)
Admission: EM | Admit: 2016-05-11 | Discharge: 2016-05-11 | Disposition: A | Discharge status: Home / Self Care | Payer: Medicaid Other | Attending: Emergency Medicine | Admitting: Emergency Medicine

## 2016-05-11 ENCOUNTER — Encounter (HOSPITAL_BASED_OUTPATIENT_CLINIC_OR_DEPARTMENT_OTHER): Payer: Self-pay | Admitting: Emergency Medicine

## 2016-05-11 DIAGNOSIS — Z87891 Personal history of nicotine dependence: Secondary | ICD-10-CM | POA: Insufficient documentation

## 2016-05-11 DIAGNOSIS — J069 Acute upper respiratory infection, unspecified: Secondary | ICD-10-CM | POA: Diagnosis not present

## 2016-05-11 DIAGNOSIS — B9789 Other viral agents as the cause of diseases classified elsewhere: Secondary | ICD-10-CM

## 2016-05-11 DIAGNOSIS — R05 Cough: Secondary | ICD-10-CM | POA: Diagnosis present

## 2016-05-11 LAB — PREGNANCY, URINE: Preg Test, Ur: NEGATIVE

## 2016-05-11 MED ORDER — BENZONATATE 100 MG PO CAPS: 100.0000 mg | ORAL_CAPSULE | Freq: Three times a day (TID) | ORAL | 0 refills | Status: DC | PRN

## 2016-05-11 NOTE — ED Provider Notes (Signed)
[facility] Provider [facility]   CSN: [id number] Arrival date & time: 05/11/16  4098     History   Chief Complaint Chief Complaint  Patient presents with  . Cough    HPI Melanie Hancock is a 20 y.o. female.  The history is provided by the patient. No language interpreter was used.  Cough     Melanie Hancock is a 20 y.o. female who presents to the Emergency Department complaining of cough.  She has 3 days of runny nose and cough productive of clear sputum with hot and cold spells. She has gagging spells with her cough that resulted in vomiting. She has multiple sick contacts with similar symptoms. She denies any chest pain, leg swelling or pain. She is 3 months postpartum. She is concerned also that she may be pregnant because her friends keep asking if she is pregnant. Her last depo shot was 2 weeks ago. She stopped breast feeding one month ago  Past Medical History:  Diagnosis Date  . Anemia   . Anxiety   . Chronic back pain   . Depression   . Infection    UTI  . Post partum depression   . Pyelonephritis   . UTI (lower urinary tract infection)     Patient Active Problem List   Diagnosis Date Noted  . Vaginal delivery 02/04/2016  . History of postpartum depression, currently pregnant 01/23/2016  . Iron deficiency anemia 01/02/2016  . 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 Anxiety 03/28/2014  . Chronic back pain 03/28/2014  . Hx pyelonephritis 2014 05/26/2013    Past Surgical History:  Procedure Laterality Date  . DILATION AND CURETTAGE OF UTERUS    . INDUCED ABORTION    . WISDOM TOOTH EXTRACTION      OB History    Gravida Para Term Preterm AB Living   5 3 3   2 3    SAB TAB Ectopic Multiple Live Births   1 1   0 3       Home Medications    Prior to Admission medications   Medication Sig Start Date End Date Taking? Authorizing Provider  benzonatate (TESSALON) 100 MG capsule Take 1 capsule (100 mg total) by mouth 3 (three) times daily as needed for  cough. 05/11/16   Tilden Fossa, MD  ibuprofen (ADVIL,MOTRIN) 600 MG tablet Take 1 tablet (600 mg total) by mouth every 6 (six) hours as needed for mild pain. 02/06/16   Nigel Bridgeman, CNM  metroNIDAZOLE (METROGEL VAGINAL) 0.75 % vaginal gel Place 1 Applicatorful vaginally at bedtime. Once daily for 5 days 04/24/16   Jerre Simon, PA  Prenatal Vit-Fe Fumarate-FA (PRENATAL MULTIVITAMIN) TABS tablet Take 1 tablet by mouth daily at 12 noon.    Historical Provider, MD    Family History Family History  Problem Relation Age of Onset  . Arthritis Mother   . Arthritis Maternal Grandmother   . Asthma Son   . Alcohol abuse Neg Hx     Social History Social History  Substance Use Topics  . Smoking status: Former Smoker    Quit date: 01/21/2014  . Smokeless tobacco: Never Used  . Alcohol use No     Allergies   Review of patient's allergies indicates no known allergies.   Review of Systems Review of Systems  Respiratory: Positive for cough.   All other systems reviewed and are negative.    Physical Exam Updated Vital Signs BP 129/68 (BP Location: Left Arm)   Pulse Marland Kitchen)  59   Temp 98.5 F (36.9 C) (Oral)   Resp 18   Ht 5\' 8"  (1.727 m)   Wt 169 lb (76.7 kg)   SpO2 100%   BMI 25.70 kg/m   Physical Exam  Constitutional: She is oriented to person, place, and time. She appears well-developed and well-nourished.  HENT:  Head: Normocephalic and atraumatic.  Right Ear: External ear normal.  Left Ear: External ear normal.  Mouth/Throat: Oropharynx is clear and moist. No oropharyngeal exudate.  Neck: Neck supple.  Cardiovascular: Normal rate and regular rhythm.   No murmur heard. Pulmonary/Chest: Effort normal and breath sounds normal. No respiratory distress.  Abdominal: Soft. There is no tenderness. There is no rebound and no guarding.  Musculoskeletal: She exhibits no edema or tenderness.  Neurological: She is alert and oriented to person, place, and time.  Skin: Skin is warm and  dry.  Psychiatric: She has a normal mood and affect. Her behavior is normal.  Nursing note and vitals reviewed.    ED Treatments / Results  Labs (all labs ordered are listed, but only abnormal results are displayed) Labs Reviewed  PREGNANCY, URINE    EKG  EKG Interpretation None       Radiology No results found.  Procedures Procedures (including critical care time)  Medications Ordered in ED Medications - No data to display   Initial Impression / Assessment and Plan / ED Course  I have reviewed the triage vital signs and the nursing notes.  Pertinent labs & imaging results that were available during my care of the patient were reviewed by me and considered in my medical decision making (see chart for details).  Clinical Course    Patient here for evaluation of nasal congestion and cough. She is in no distress in the emergency department with clear lungs. Presentation is not consistent with pneumonia, CHF. Discussed with patient home care for viral URI with antitussives and over-the-counter medications. Patient requested pregnancy test that was negative.  Final Clinical Impressions(s) / ED Diagnoses   Final diagnoses:  Viral URI with cough    New Prescriptions Discharge Medication List as of 05/11/2016 10:26 AM    START taking these medications   Details  benzonatate (TESSALON) 100 MG capsule Take 1 capsule (100 mg total) by mouth 3 (three) times daily as needed for cough., Starting Fri 05/11/2016, Print         Tilden FossaElizabeth Kenny Stern, MD 05/11/16 1126

## 2016-05-11 NOTE — ED Notes (Signed)
MD at bedside. 

## 2016-05-11 NOTE — ED Triage Notes (Addendum)
Productive cough since Wednesday with nausea. Taking dayquil and zyrtec without relief. Pt states she coughs so hard she is gagging.

## 2016-06-11 ENCOUNTER — Encounter (HOSPITAL_COMMUNITY): Payer: Self-pay | Admitting: *Deleted

## 2016-06-11 ENCOUNTER — Ambulatory Visit (HOSPITAL_COMMUNITY)
Admission: EM | Admit: 2016-06-11 | Discharge: 2016-06-11 | Disposition: A | Discharge status: Home / Self Care | Payer: Medicaid Other | Attending: Family Medicine | Admitting: Family Medicine

## 2016-06-11 DIAGNOSIS — K219 Gastro-esophageal reflux disease without esophagitis: Secondary | ICD-10-CM

## 2016-06-11 LAB — POCT URINALYSIS DIP (DEVICE)
Bilirubin Urine: NEGATIVE
Glucose, UA: NEGATIVE mg/dL
Hgb urine dipstick: NEGATIVE
Ketones, ur: NEGATIVE mg/dL
Leukocytes, UA: NEGATIVE
Nitrite: NEGATIVE
Protein, ur: NEGATIVE mg/dL
Specific Gravity, Urine: 1.015 (ref 1.005–1.030)
Urobilinogen, UA: 0.2 mg/dL (ref 0.0–1.0)
pH: 7 (ref 5.0–8.0)

## 2016-06-11 LAB — POCT PREGNANCY, URINE: Preg Test, Ur: NEGATIVE

## 2016-06-11 MED ORDER — GI COCKTAIL ~~LOC~~
30.0000 mL | Freq: Once | ORAL | Status: AC
  Administered 2016-06-11: 30 mL via ORAL

## 2016-06-11 MED ORDER — GI COCKTAIL ~~LOC~~
ORAL | Status: AC
  Filled 2016-06-11: qty 30

## 2016-06-11 MED ORDER — ESOMEPRAZOLE MAGNESIUM 40 MG PO CPDR: 40.0000 mg | DELAYED_RELEASE_CAPSULE | Freq: Every day | ORAL | 1 refills | Status: DC

## 2016-06-11 NOTE — ED Provider Notes (Signed)
[facility]    CSN: [id number] Arrival date & time: 06/11/16  1839     History   Chief Complaint Chief Complaint  Patient presents with  . Abdominal Pain    HPI Melanie Hancock is a 20 y.o. female.   The history is provided by the patient.  Abdominal Pain  Pain location:  Epigastric and RLQ Pain quality: burning and pressure   Pain radiates to:  Does not radiate Pain severity:  Mild Onset quality:  Gradual Duration:  1 week Chronicity:  New Relieved by:  Belching Associated symptoms: nausea and vomiting   Associated symptoms: no dysuria and no fever     Past Medical History:  Diagnosis Date  . Anemia   . Anxiety   . Chronic back pain   . Depression   . Infection    UTI  . Post partum depression   . Pyelonephritis   . UTI (lower urinary tract infection)     Patient Active Problem List   Diagnosis Date Noted  . Vaginal delivery 02/04/2016  . History of postpartum depression, currently pregnant 01/23/2016  . Iron deficiency anemia 01/02/2016  . 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 Anxiety 03/28/2014  . Chronic back pain 03/28/2014  . Hx pyelonephritis 2014 05/26/2013    Past Surgical History:  Procedure Laterality Date  . DILATION AND CURETTAGE OF UTERUS    . INDUCED ABORTION    . WISDOM TOOTH EXTRACTION      OB History    Gravida Para Term Preterm AB Living   5 3 3   2 3    SAB TAB Ectopic Multiple Live Births   1 1   0 3       Home Medications    Prior to Admission medications   Medication Sig Start Date End Date Taking? Authorizing Provider  benzonatate (TESSALON) 100 MG capsule Take 1 capsule (100 mg total) by mouth 3 (three) times daily as needed for cough. 05/11/16   Tilden Fossa, MD  ibuprofen (ADVIL,MOTRIN) 600 MG tablet Take 1 tablet (600 mg total) by mouth every 6 (six) hours as needed for mild pain. 02/06/16   Nigel Bridgeman, CNM  metroNIDAZOLE (METROGEL VAGINAL) 0.75 % vaginal gel Place 1 Applicatorful vaginally at bedtime. Once  daily for 5 days 04/24/16   Jerre Simon, PA  Prenatal Vit-Fe Fumarate-FA (PRENATAL MULTIVITAMIN) TABS tablet Take 1 tablet by mouth daily at 12 noon.    Historical Provider, MD    Family History Family History  Problem Relation Age of Onset  . Arthritis Mother   . Arthritis Maternal Grandmother   . Asthma Son   . Alcohol abuse Neg Hx     Social History Social History  Substance Use Topics  . Smoking status: Former Smoker    Quit date: 01/21/2014  . Smokeless tobacco: Never Used  . Alcohol use No     Allergies   Review of patient's allergies indicates no known allergies.   Review of Systems Review of Systems  Constitutional: Negative for fever.  Gastrointestinal: Positive for abdominal pain, nausea and vomiting.  Genitourinary: Negative.  Negative for dysuria, frequency and menstrual problem.  All other systems reviewed and are negative.    Physical Exam Triage Vital Signs ED Triage Vitals  Enc Vitals Group     BP 06/11/16 1844 (!) 101/53     Pulse Rate 06/11/16 1844 68     Resp 06/11/16 1844 18     Temp 06/11/16 1844 99.5 F (37.5  C)     Temp Source 06/11/16 1844 Oral     SpO2 06/11/16 1844 100 %     Weight --      Height --      Head Circumference --      Peak Flow --      Pain Score 06/11/16 1902 10     Pain Loc --      Pain Edu? --      Excl. in GC? --    No data found.   Updated Vital Signs BP (!) 101/53 (BP Location: Left Arm)   Pulse 68   Temp 99.5 F (37.5 C) (Oral)   Resp 18   SpO2 100%   Visual Acuity Right Eye Distance:   Left Eye Distance:   Bilateral Distance:    Right Eye Near:   Left Eye Near:    Bilateral Near:     Physical Exam  Constitutional: She appears well-developed and well-nourished. She appears distressed.  Neck: Normal range of motion. Neck supple.  Cardiovascular: Normal rate, regular rhythm, normal heart sounds and intact distal pulses.   Pulmonary/Chest: Effort normal and breath sounds normal.  Abdominal:  Soft. Bowel sounds are normal. She exhibits no mass. There is tenderness. There is no rebound and no guarding. No hernia.  Lymphadenopathy:    She has no cervical adenopathy.  Skin: Skin is warm and dry.  Nursing note and vitals reviewed.    UC Treatments / Results  Labs (all labs ordered are listed, but only abnormal results are displayed) Labs Reviewed - No data to display U/a and upreg neg.  EKG  EKG Interpretation None       Radiology No results found.  Procedures Procedures (including critical care time)  Medications Ordered in UC Medications  gi cocktail (Maalox,Lidocaine,Donnatal) (not administered)     Initial Impression / Assessment and Plan / UC Course  I have reviewed the triage vital signs and the nursing notes.  Pertinent labs & imaging results that were available during my care of the patient were reviewed by me and considered in my medical decision making (see chart for details).  Clinical Course    Sx improved post gi cocktail and finding that preg test was neg.  Final Clinical Impressions(s) / UC Diagnoses   Final diagnoses:  None    New Prescriptions New Prescriptions   No medications on file     Linna Hoff, MD 06/11/16 2013

## 2016-06-11 NOTE — Discharge Instructions (Signed)
Use medicine daily and see your doctor if further problems.

## 2016-06-11 NOTE — ED Triage Notes (Signed)
Pt  Reports  Epigastric  Pain  For   About          1   Week         With  Pain the  Epigastric  Area         And    r lower quadrant  As  Well        She  Reports  Pain worse  After   Drinking  Coffee      She  Reports  The  Pain  As  Being  Sharp  She  Reports    Some   Weakness   As   Well      She  Was  Seen  For  Possible  bv      sev  Weeks  Ago  And  Was  rx  Flagyl

## 2016-06-12 ENCOUNTER — Inpatient Hospital Stay (HOSPITAL_COMMUNITY)
Admission: AD | Admit: 2016-06-12 | Discharge: 2016-06-12 | Disposition: A | Discharge status: Home / Self Care | Payer: Medicaid Other | Source: Ambulatory Visit | Attending: Obstetrics and Gynecology | Admitting: Obstetrics and Gynecology

## 2016-06-12 ENCOUNTER — Inpatient Hospital Stay (HOSPITAL_COMMUNITY): Payer: Medicaid Other

## 2016-06-12 ENCOUNTER — Encounter (HOSPITAL_COMMUNITY): Payer: Self-pay | Admitting: *Deleted

## 2016-06-12 DIAGNOSIS — R102 Pelvic and perineal pain: Secondary | ICD-10-CM | POA: Diagnosis not present

## 2016-06-12 DIAGNOSIS — Z87891 Personal history of nicotine dependence: Secondary | ICD-10-CM | POA: Insufficient documentation

## 2016-06-12 DIAGNOSIS — R1031 Right lower quadrant pain: Secondary | ICD-10-CM | POA: Insufficient documentation

## 2016-06-12 DIAGNOSIS — K219 Gastro-esophageal reflux disease without esophagitis: Secondary | ICD-10-CM | POA: Insufficient documentation

## 2016-06-12 LAB — COMPREHENSIVE METABOLIC PANEL
ALT: 23 U/L (ref 14–54)
AST: 17 U/L (ref 15–41)
Albumin: 4.4 g/dL (ref 3.5–5.0)
Alkaline Phosphatase: 60 U/L (ref 38–126)
Anion gap: 8 (ref 5–15)
BUN: 8 mg/dL (ref 6–20)
CO2: 22 mmol/L (ref 22–32)
Calcium: 9.5 mg/dL (ref 8.9–10.3)
Chloride: 107 mmol/L (ref 101–111)
Creatinine, Ser: 0.78 mg/dL (ref 0.44–1.00)
GFR calc Af Amer: >60 mL/min (ref >60)
GFR calc non Af Amer: >60 mL/min (ref >60)
Glucose, Bld: 94 mg/dL (ref 65–99)
Potassium: 3.9 mmol/L (ref 3.5–5.1)
Sodium: 137 mmol/L (ref 135–145)
Total Bilirubin: 1 mg/dL (ref 0.3–1.2)
Total Protein: 8 g/dL (ref 6.5–8.1)

## 2016-06-12 LAB — URINALYSIS, ROUTINE W REFLEX MICROSCOPIC
Glucose, UA: NEGATIVE mg/dL
Hgb urine dipstick: NEGATIVE
Ketones, ur: NEGATIVE mg/dL
Leukocytes, UA: NEGATIVE
Nitrite: NEGATIVE
Protein, ur: NEGATIVE mg/dL
Specific Gravity, Urine: 1.02 (ref 1.005–1.030)
pH: 7 (ref 5.0–8.0)

## 2016-06-12 LAB — CBC WITH DIFFERENTIAL/PLATELET
Basophils Absolute: 0 10*3/uL (ref 0.0–0.1)
Basophils Relative: 1 %
Eosinophils Absolute: 0.3 10*3/uL (ref 0.0–0.7)
Eosinophils Relative: 7 %
HCT: 38.6 % (ref 36.0–46.0)
Hemoglobin: 13.4 g/dL (ref 12.0–15.0)
Lymphocytes Relative: 44 %
Lymphs Abs: 1.7 10*3/uL (ref 0.7–4.0)
MCH: 29.4 pg (ref 26.0–34.0)
MCHC: 34.7 g/dL (ref 30.0–36.0)
MCV: 84.6 fL (ref 78.0–100.0)
Monocytes Absolute: 0.2 10*3/uL (ref 0.1–1.0)
Monocytes Relative: 6 %
Neutro Abs: 1.6 10*3/uL — ABNORMAL LOW (ref 1.7–7.7)
Neutrophils Relative %: 42 %
Platelets: 268 10*3/uL (ref 150–400)
RBC: 4.56 MIL/uL (ref 3.87–5.11)
RDW: 13 % (ref 11.5–15.5)
WBC: 3.8 10*3/uL — ABNORMAL LOW (ref 4.0–10.5)

## 2016-06-12 LAB — WET PREP, GENITAL
Clue Cells Wet Prep HPF POC: NONE SEEN
Sperm: NONE SEEN
Trich, Wet Prep: NONE SEEN
Yeast Wet Prep HPF POC: NONE SEEN

## 2016-06-12 LAB — POCT PREGNANCY, URINE: Preg Test, Ur: NEGATIVE

## 2016-06-12 MED ORDER — GI COCKTAIL ~~LOC~~
30.0000 mL | Freq: Once | ORAL | Status: DC
  Filled 2016-06-12: qty 30

## 2016-06-12 NOTE — Discharge Instructions (Signed)
Your ultrasound and blood work are normal. Follow up with a primary care doctor for your chest/epigastric pain.   Abdominal Pain, Adult Many things can cause abdominal pain. Usually, abdominal pain is not caused by a disease and will improve without treatment. It can often be observed and treated at home. Your health care provider will do a physical exam and possibly order blood tests and X-rays to help determine the seriousness of your pain. However, in many cases, more time must pass before a clear cause of the pain can be found. Before that point, your health care provider may not know if you need more testing or further treatment. HOME CARE INSTRUCTIONS Monitor your abdominal pain for any changes. The following actions may help to alleviate any discomfort you are experiencing:  Only take over-the-counter or prescription medicines as directed by your health care provider.  Do not take laxatives unless directed to do so by your health care provider.  Try a clear liquid diet (broth, tea, or water) as directed by your health care provider. Slowly move to a bland diet as tolerated. SEEK MEDICAL CARE IF:  You have unexplained abdominal pain.  You have abdominal pain associated with nausea or diarrhea.  You have pain when you urinate or have a bowel movement.  You experience abdominal pain that wakes you in the night.  You have abdominal pain that is worsened or improved by eating food.  You have abdominal pain that is worsened with eating fatty foods.  You have a fever. SEEK IMMEDIATE MEDICAL CARE IF:  Your pain does not go away within 2 hours.  You keep throwing up (vomiting).  Your pain is felt only in portions of the abdomen, such as the right side or the left lower portion of the abdomen.  You pass bloody or black tarry stools. MAKE SURE YOU:  Understand these instructions.  Will watch your condition.  Will get help right away if you are not doing well or get worse.     This information is not intended to replace advice given to you by your health care provider. Make sure you discuss any questions you have with your health care provider.   Document Released: 05/16/2005 Document Revised: 04/27/2015 Document Reviewed: 04/15/2013 Elsevier Interactive Patient Education [employer]2016 [employer] Inc[employer]

## 2016-06-12 NOTE — MAU Provider Note (Signed)
History     CSN: [id number]  Arrival date and time: 06/12/16 4098   None     Chief Complaint  Patient presents with  . Abdominal Pain  . Chest Pain   Abdominal Pain  This is a new problem. The current episode started in the past 7 days. The onset quality is gradual. The problem occurs constantly. The problem has been gradually worsening. The pain is located in the epigastric region and RLQ. The pain is at a severity of 8/10. The pain is moderate. The quality of the pain is sharp. The abdominal pain radiates to the back. Associated symptoms include anorexia and frequency. Pertinent negatives include no constipation, diarrhea, dysuria, fever, hematuria, nausea, vomiting or weight loss. Exacerbated by: soda. The pain is relieved by nothing. She has tried nothing for the symptoms. There is no history of gallstones, irritable bowel syndrome or ulcerative colitis.  Chest Pain   Associated symptoms include abdominal pain and back pain. Pertinent negatives include no fever, nausea or vomiting.   Melanie Hancock is a 20 y.o. [id number] who presents to the MAU with epigastric pain and RLQ abdominal pain. She was evaluated at Urgent Care yesterday and dx with GERD. She has not picked up her medication.  Patient reports that she was treated a few weeks ago at [facility] ED for bacterial vaginosis but she continues to have dark urine with a bad smell and yellow vaginal d/c. Current sex partner 1 year, unprotected sex. Depo Provera for birth control. Patient also reports hx of ovarian cyst.  Patient reports have chest pain since age 67 that is off and on. She denies chest pain at this time. Patient moved to [place] 2 years ago and does not have a primary care doctor yet.     Past Medical History:  Diagnosis Date  . Anemia   . Anxiety   . Chronic back pain   . Depression   . Infection    UTI  . Post partum depression   . Pyelonephritis   . UTI (lower urinary tract infection)     Past  Surgical History:  Procedure Laterality Date  . DILATION AND CURETTAGE OF UTERUS    . INDUCED ABORTION    . WISDOM TOOTH EXTRACTION      Family History  Problem Relation Age of Onset  . Arthritis Mother   . Arthritis Maternal Grandmother   . Asthma Son   . Alcohol abuse Neg Hx     Social History  Substance Use Topics  . Smoking status: Former Smoker    Quit date: 01/21/2014  . Smokeless tobacco: Never Used  . Alcohol use No    Allergies: No Known Allergies  Prescriptions Prior to Admission  Medication Sig Dispense Refill Last Dose  . medroxyPROGESTERone (DEPO-PROVERA) 150 MG/ML injection Inject 150 mg into the muscle every 3 (three) months.   04/20/2016 at unknown  . esomeprazole (NEXIUM) 40 MG capsule Take 1 capsule (40 mg total) by mouth daily. (Patient not taking: Reported on 06/12/2016) 30 capsule 1 Not Taking at Unknown time    Review of Systems  Constitutional: Negative for fever and weight loss.  HENT: Negative.   Cardiovascular: Positive for chest pain.  Gastrointestinal: Positive for abdominal pain and anorexia. Negative for constipation, diarrhea, nausea and vomiting.  Genitourinary: Positive for frequency. Negative for dysuria, flank pain, hematuria and urgency.       Vaginal discharge  Musculoskeletal: Positive for back pain.  Skin: Negative for itching and rash.  Psychiatric/Behavioral: The patient is not nervous/anxious.    Physical Exam   Blood pressure 113/58, pulse (!) 58, temperature 98.2 F (36.8 C), temperature source Oral, resp. rate 18, SpO2 100 %, not currently breastfeeding.  Physical Exam  Nursing note and vitals reviewed. Constitutional: She is oriented to person, place, and time. She appears well-developed and well-nourished. No distress.  HENT:  Head: Normocephalic and atraumatic.  Eyes: Conjunctivae and EOM are normal.  Neck: Normal range of motion. Neck supple.  Cardiovascular: Normal rate and regular rhythm.   Respiratory: Effort  normal and breath sounds normal. No respiratory distress.  GI: Soft. Bowel sounds are normal. There is tenderness in the right lower quadrant. There is no rebound, no guarding and no CVA tenderness.  Genitourinary:  Genitourinary Comments: External genitalia without lesions, white d/c vaginal vault, mild CMT, right adnexal tenderness, uterus without palpable enlargement.   Musculoskeletal: Normal range of motion.  Neurological: She is alert and oriented to person, place, and time.  Skin: Skin is warm and dry.  Psychiatric: She has a normal mood and affect. Her behavior is normal. Judgment and thought content normal.    MAU Course  Procedures  MDM   Assessment and Plan  20 y.o. female with lower abdominal cramping and vaginal d/c stable for d/c without acute abdomen, normal labs and ultrasound. Discussed with the patient and all questioned fully answered. She will f/u with her GYN or return if any problems arise.

## 2016-06-12 NOTE — MAU Note (Addendum)
Pt states she has been experiencing rt up chest pain at top of breast and rt lower sharp in nature abd pain x 2 weeks.  Pain in chest is sharp and usually occurs after caffeine intake.  Was seen at urgent care yesterday and dx with reflux.  Pt hasn't filled the RX for reflux.   Pt had negative UPT yesterday.  Pt states she has a hx with cyst on her ovaries.

## 2016-06-13 LAB — HIV ANTIBODY (ROUTINE TESTING W REFLEX): HIV Screen 4th Generation wRfx: NONREACTIVE

## 2016-06-13 LAB — GC/CHLAMYDIA PROBE AMP (~~LOC~~) NOT AT ARMC
Chlamydia: NEGATIVE
Neisseria Gonorrhea: NEGATIVE

## 2016-06-13 LAB — RPR: RPR Ser Ql: NONREACTIVE

## 2016-06-20 ENCOUNTER — Ambulatory Visit: Payer: Medicaid Other | Attending: Obstetrics & Gynecology | Admitting: Physical Therapy

## 2016-07-04 ENCOUNTER — Encounter (HOSPITAL_COMMUNITY): Payer: Self-pay | Admitting: Family Medicine

## 2016-07-04 ENCOUNTER — Ambulatory Visit (HOSPITAL_COMMUNITY)
Admission: EM | Admit: 2016-07-04 | Discharge: 2016-07-04 | Disposition: A | Discharge status: Home / Self Care | Payer: Medicaid Other | Attending: Emergency Medicine | Admitting: Emergency Medicine

## 2016-07-04 DIAGNOSIS — Z8261 Family history of arthritis: Secondary | ICD-10-CM | POA: Insufficient documentation

## 2016-07-04 DIAGNOSIS — F419 Anxiety disorder, unspecified: Secondary | ICD-10-CM | POA: Diagnosis not present

## 2016-07-04 DIAGNOSIS — Z811 Family history of alcohol abuse and dependence: Secondary | ICD-10-CM | POA: Insufficient documentation

## 2016-07-04 DIAGNOSIS — Z79899 Other long term (current) drug therapy: Secondary | ICD-10-CM | POA: Insufficient documentation

## 2016-07-04 DIAGNOSIS — B3731 Acute candidiasis of vulva and vagina: Secondary | ICD-10-CM

## 2016-07-04 DIAGNOSIS — G8929 Other chronic pain: Secondary | ICD-10-CM | POA: Insufficient documentation

## 2016-07-04 DIAGNOSIS — F329 Major depressive disorder, single episode, unspecified: Secondary | ICD-10-CM | POA: Diagnosis not present

## 2016-07-04 DIAGNOSIS — N898 Other specified noninflammatory disorders of vagina: Secondary | ICD-10-CM | POA: Diagnosis not present

## 2016-07-04 DIAGNOSIS — N390 Urinary tract infection, site not specified: Secondary | ICD-10-CM | POA: Diagnosis not present

## 2016-07-04 DIAGNOSIS — Z825 Family history of asthma and other chronic lower respiratory diseases: Secondary | ICD-10-CM | POA: Diagnosis not present

## 2016-07-04 DIAGNOSIS — B373 Candidiasis of vulva and vagina: Secondary | ICD-10-CM | POA: Diagnosis not present

## 2016-07-04 DIAGNOSIS — Z87891 Personal history of nicotine dependence: Secondary | ICD-10-CM | POA: Diagnosis not present

## 2016-07-04 DIAGNOSIS — Z9889 Other specified postprocedural states: Secondary | ICD-10-CM | POA: Diagnosis not present

## 2016-07-04 LAB — POCT PREGNANCY, URINE: Preg Test, Ur: NEGATIVE

## 2016-07-04 MED ORDER — CEFTRIAXONE SODIUM 1 G IJ SOLR
INTRAMUSCULAR | Status: AC
  Filled 2016-07-04: qty 10

## 2016-07-04 MED ORDER — FLUCONAZOLE 200 MG PO TABS: 200.0000 mg | ORAL_TABLET | Freq: Every day | ORAL | 0 refills | Status: AC

## 2016-07-04 MED ORDER — METRONIDAZOLE 500 MG PO TABS: 500.0000 mg | ORAL_TABLET | Freq: Two times a day (BID) | ORAL | 0 refills | Status: DC

## 2016-07-04 MED ORDER — FLUCONAZOLE 100 MG PO TABS: 100.0000 mg | ORAL_TABLET | Freq: Every day | ORAL | Status: DC

## 2016-07-04 MED ORDER — CIPROFLOXACIN HCL 500 MG PO TABS: 500.0000 mg | ORAL_TABLET | Freq: Two times a day (BID) | ORAL | 0 refills | Status: DC

## 2016-07-04 MED ORDER — CEFTRIAXONE SODIUM 1 G IJ SOLR
1.0000 g | Freq: Once | INTRAMUSCULAR | Status: AC
  Administered 2016-07-04: 1 g via INTRAMUSCULAR

## 2016-07-04 NOTE — ED Provider Notes (Signed)
CSN: [id number]     Arrival date & time 07/04/16  1612 History   First MD Initiated Contact with Patient 07/04/16 1637     Chief Complaint  Patient presents with  . Vaginal Discharge   (Consider location/radiation/quality/duration/timing/severity/associated sxs/prior Treatment) Pt states that she has has frequency , vaginal discharge intmit and burning for the past week. While she is sitting at work she has burning to her labia majora. Denies any fever no n/v/d. Has not taken anything for this. Has had sexual interactions but is unsure if it could be an STI. When asked about it she agrees to have it checked in her urine to make sure. States that she has pain and swelling to external labia area for the past 3 days. Does state that she has been drinking more sodas recently and not going to the bathroom like she should while she is at work.       Past Medical History:  Diagnosis Date  . Anemia   . Anxiety   . Chronic back pain   . Depression   . Infection    UTI  . Post partum depression   . Pyelonephritis   . UTI (lower urinary tract infection)    Past Surgical History:  Procedure Laterality Date  . DILATION AND CURETTAGE OF UTERUS    . INDUCED ABORTION    . WISDOM TOOTH EXTRACTION     Family History  Problem Relation Age of Onset  . Arthritis Mother   . Arthritis Maternal Grandmother   . Asthma Son   . Alcohol abuse Neg Hx    Social History  Substance Use Topics  . Smoking status: Former Smoker    Quit date: 01/21/2014  . Smokeless tobacco: Never Used  . Alcohol use No   OB History    Gravida Para Term Preterm AB Living   5 3 3   2 3    SAB TAB Ectopic Multiple Live Births   1 1   0 3     Review of Systems  Constitutional: Negative.   Respiratory: Negative.   Cardiovascular: Negative.   Gastrointestinal: Negative.   Genitourinary: Positive for dysuria, frequency and vaginal pain.    Allergies  Patient has no known allergies.  Home Medications   Prior to  Admission medications   Medication Sig Start Date End Date Taking? Authorizing Provider  ciprofloxacin (CIPRO) 500 MG tablet Take 1 tablet (500 mg total) by mouth 2 (two) times daily. 07/04/16   Tobi BastosMelanie A Coltrane Tugwell, NP  esomeprazole (NEXIUM) 40 MG capsule Take 1 capsule (40 mg total) by mouth daily. Patient not taking: Reported on 06/12/2016 06/11/16   Linna HoffJames D Kindl, MD  medroxyPROGESTERone (DEPO-PROVERA) 150 MG/ML injection Inject 150 mg into the muscle every 3 (three) months.    Historical Provider, MD   Meds Ordered and Administered this Visit   Medications  fluconazole (DIFLUCAN) tablet 100 mg (not administered)  cefTRIAXone (ROCEPHIN) injection 1 g (not administered)    BP 129/72   Pulse 87   Temp 98.2 F (36.8 C) (Oral)   Resp 18   SpO2 98%  No data found.   Physical Exam  Constitutional: She appears well-developed.  Cardiovascular: Normal rate, regular rhythm and normal heart sounds.   Pulmonary/Chest: Effort normal and breath sounds normal.  Abdominal: Soft.  Genitourinary:  Genitourinary Comments: cv tenderness, erythema to labia majoria,   Neurological: She is alert.  Skin: There is erythema.  Vaginal labia     Urgent Care Course  Clinical Course     Procedures (including critical care time)  Labs Review Labs Reviewed  POCT PREGNANCY, URINE  URINE CYTOLOGY ANCILLARY ONLY    Imaging Review No results found.          MDM   1. Lower urinary tract infectious disease   2. Vaginal yeast infection    Take full dose of abx Do not drink alcohol while taking medication Use protection with sexual encounters We will test for STI and if anything test positive we will call with the results.      Tobi BastosMelanie A Trejuan Matherne, NP 07/04/16 [phone]

## 2016-07-04 NOTE — ED Triage Notes (Signed)
Pt here for vaginal discharge and irritation for a few days.

## 2016-07-05 LAB — URINE CYTOLOGY ANCILLARY ONLY
Chlamydia: NEGATIVE
Neisseria Gonorrhea: NEGATIVE

## 2016-08-21 ENCOUNTER — Encounter (HOSPITAL_COMMUNITY): Payer: Self-pay | Admitting: Emergency Medicine

## 2016-08-21 ENCOUNTER — Ambulatory Visit (HOSPITAL_COMMUNITY)
Admission: EM | Admit: 2016-08-21 | Discharge: 2016-08-21 | Disposition: A | Discharge status: Home / Self Care | Payer: Medicaid Other | Attending: Family Medicine | Admitting: Family Medicine

## 2016-08-21 DIAGNOSIS — Z8261 Family history of arthritis: Secondary | ICD-10-CM | POA: Diagnosis not present

## 2016-08-21 DIAGNOSIS — J029 Acute pharyngitis, unspecified: Secondary | ICD-10-CM

## 2016-08-21 DIAGNOSIS — N898 Other specified noninflammatory disorders of vagina: Secondary | ICD-10-CM

## 2016-08-21 DIAGNOSIS — F329 Major depressive disorder, single episode, unspecified: Secondary | ICD-10-CM | POA: Diagnosis not present

## 2016-08-21 DIAGNOSIS — Z811 Family history of alcohol abuse and dependence: Secondary | ICD-10-CM | POA: Insufficient documentation

## 2016-08-21 DIAGNOSIS — B9689 Other specified bacterial agents as the cause of diseases classified elsewhere: Secondary | ICD-10-CM

## 2016-08-21 DIAGNOSIS — Z87891 Personal history of nicotine dependence: Secondary | ICD-10-CM | POA: Diagnosis not present

## 2016-08-21 DIAGNOSIS — F419 Anxiety disorder, unspecified: Secondary | ICD-10-CM | POA: Insufficient documentation

## 2016-08-21 DIAGNOSIS — Z79899 Other long term (current) drug therapy: Secondary | ICD-10-CM | POA: Insufficient documentation

## 2016-08-21 DIAGNOSIS — R0982 Postnasal drip: Secondary | ICD-10-CM | POA: Insufficient documentation

## 2016-08-21 DIAGNOSIS — Z9889 Other specified postprocedural states: Secondary | ICD-10-CM | POA: Insufficient documentation

## 2016-08-21 DIAGNOSIS — N760 Acute vaginitis: Secondary | ICD-10-CM | POA: Diagnosis not present

## 2016-08-21 DIAGNOSIS — G8929 Other chronic pain: Secondary | ICD-10-CM | POA: Insufficient documentation

## 2016-08-21 LAB — POCT PREGNANCY, URINE: Preg Test, Ur: NEGATIVE

## 2016-08-21 MED ORDER — METRONIDAZOLE 500 MG PO TABS: 500.0000 mg | ORAL_TABLET | Freq: Two times a day (BID) | ORAL | 0 refills | Status: DC

## 2016-08-21 NOTE — Discharge Instructions (Signed)
Drink plenty of fluids, Cepacol lozenges, ibuprofen for sore throat. Take the Flagyl as directed for discharge.  if there are any positive tests for other infections in the swabs collected we will call you and likely be able to treat to over the phone.

## 2016-08-21 NOTE — ED Triage Notes (Signed)
Multiple complaints:  First complaint of sore throat and both ears hurting, no fever, slight cough.    And patient has concerns for a different discharge than usual, changed one week ago.

## 2016-08-21 NOTE — ED Provider Notes (Signed)
CSN: [id number]     Arrival date & time 08/21/16  1155 History   First MD Initiated Contact with Patient 08/21/16 1336     Chief Complaint  Patient presents with  . Sore Throat   (Consider location/radiation/quality/duration/timing/severity/associated sxs/prior Treatment) 21 year old female complaining of a minor sore throat for 2 days. No fever or chills. She states her right ear does feel a little uncomfortable and is if there were drainage of fluid in the ears.  Second complaint is that of vaginal discharge for 1 week. Her initial complaint was a change in to type of vaginal discharge. States it is malodorous. No pelvic pain. No fevers or chills.      Past Medical History:  Diagnosis Date  . Anemia   . Anxiety   . Chronic back pain   . Depression   . Infection    UTI  . Post partum depression   . Pyelonephritis   . UTI (lower urinary tract infection)    Past Surgical History:  Procedure Laterality Date  . DILATION AND CURETTAGE OF UTERUS    . INDUCED ABORTION    . WISDOM TOOTH EXTRACTION     Family History  Problem Relation Age of Onset  . Arthritis Mother   . Arthritis Maternal Grandmother   . Asthma Son   . Alcohol abuse Neg Hx    Social History  Substance Use Topics  . Smoking status: Former Smoker    Quit date: 01/21/2014  . Smokeless tobacco: Never Used  . Alcohol use No   OB History    Gravida Para Term Preterm AB Living   5 3 3   2 3    SAB TAB Ectopic Multiple Live Births   1 1   0 3     Review of Systems  Constitutional: Negative for activity change, chills, fatigue and fever.  HENT: Positive for ear pain and sore throat. Negative for congestion and postnasal drip.   Eyes: Negative.   Respiratory: Negative.   Cardiovascular: Negative.   Genitourinary: Positive for vaginal discharge. Negative for dysuria and pelvic pain.  Neurological: Negative.   All other systems reviewed and are negative.   Allergies  Patient has no known allergies.  Home  Medications   Prior to Admission medications   Medication Sig Start Date End Date Taking? Authorizing Provider  ciprofloxacin (CIPRO) 500 MG tablet Take 1 tablet (500 mg total) by mouth 2 (two) times daily. Patient not taking: Reported on 08/21/2016 07/04/16   Tobi BastosMelanie A Mitchell, NP  esomeprazole (NEXIUM) 40 MG capsule Take 1 capsule (40 mg total) by mouth daily. Patient not taking: Reported on 06/12/2016 06/11/16   Linna HoffJames D Kindl, MD  medroxyPROGESTERone (DEPO-PROVERA) 150 MG/ML injection Inject 150 mg into the muscle every 3 (three) months. DID NOT OBTAIN December INJECTION    Historical Provider, MD  metroNIDAZOLE (FLAGYL) 500 MG tablet Take 1 tablet (500 mg total) by mouth 2 (two) times daily. Patient not taking: Reported on 08/21/2016 07/04/16   Tobi BastosMelanie A Mitchell, NP   Meds Ordered and Administered this Visit  Medications - No data to display  BP 144/96 (BP Location: Right Arm)   Pulse 91   Temp 98.2 F (36.8 C) (Oral)   Resp 16   SpO2 97%  No data found.   Physical Exam  Constitutional: She is oriented to person, place, and time. She appears well-developed and well-nourished. No distress.  HENT:  Head: Normocephalic and atraumatic.  Right Ear: External ear normal.  Left Ear:  External ear normal.  Mouth/Throat: No oropharyngeal exudate.  Eyes: EOM are normal. Pupils are equal, round, and reactive to light.  Neck: Normal range of motion. Neck supple.  Cardiovascular: Normal rate, regular rhythm, normal heart sounds and intact distal pulses.   Pulmonary/Chest: Effort normal and breath sounds normal. No respiratory distress. She has no wheezes. She has no rales.  Genitourinary: Rectal exam shows guaiac negative stool. Vaginal discharge found.  Genitourinary Comments: Malodorous thin gray discharge from the vaginal introitus. Also a thick yellow mucoid discharge covering the cervix. No CMT. Ectocervix is pain and no lesions.  Musculoskeletal: Normal range of motion. She exhibits no  edema.  Neurological: She is alert and oriented to person, place, and time.  Skin: Skin is warm and dry.  Nursing note and vitals reviewed.   Urgent Care Course   Clinical Course     Procedures (including critical care time)  Labs Review Labs Reviewed  CERVICOVAGINAL ANCILLARY ONLY     Imaging Review No results found.   Visual Acuity Review  Right Eye Distance:   Left Eye Distance:   Bilateral Distance:    Right Eye Near:   Left Eye Near:    Bilateral Near:         MDM   1. Sore throat   2. PND (post-nasal drip)   3. Vaginal discharge   4. BV (bacterial vaginosis)    Drink plenty of fluids, Cepacol lozenges, ibuprofen for sore throat. Take the Flagyl as directed for discharge.  if there are any positive test for other infections in the swabs collected we will call you and likely be able to treat to over the phone. Meds ordered this encounter  Medications  . metroNIDAZOLE (FLAGYL) 500 MG tablet    Sig: Take 1 tablet (500 mg total) by mouth 2 (two) times daily. X 7 days    Dispense:  14 tablet    Refill:  0    Order Specific Question:   Supervising Provider    Answer:   Micheline Chapman        Hayden Rasmussen, NP 08/21/16 1456

## 2016-08-22 LAB — CERVICOVAGINAL ANCILLARY ONLY
Chlamydia: NEGATIVE
Neisseria Gonorrhea: NEGATIVE
Wet Prep (BD Affirm): POSITIVE — AB

## 2016-09-18 ENCOUNTER — Emergency Department (HOSPITAL_COMMUNITY)
Admission: EM | Admit: 2016-09-18 | Discharge: 2016-09-18 | Disposition: A | Discharge status: Home / Self Care | Payer: Medicaid Other | Attending: Emergency Medicine | Admitting: Emergency Medicine

## 2016-09-18 ENCOUNTER — Encounter (HOSPITAL_COMMUNITY): Payer: Self-pay | Admitting: Emergency Medicine

## 2016-09-18 DIAGNOSIS — Z87891 Personal history of nicotine dependence: Secondary | ICD-10-CM | POA: Diagnosis not present

## 2016-09-18 DIAGNOSIS — Z79899 Other long term (current) drug therapy: Secondary | ICD-10-CM | POA: Insufficient documentation

## 2016-09-18 DIAGNOSIS — K0889 Other specified disorders of teeth and supporting structures: Secondary | ICD-10-CM | POA: Diagnosis present

## 2016-09-18 MED ORDER — NAPROXEN 500 MG PO TABS: 500.0000 mg | ORAL_TABLET | Freq: Two times a day (BID) | ORAL | 0 refills | Status: DC

## 2016-09-18 MED ORDER — LIDOCAINE VISCOUS 2 % MT SOLN: OROMUCOSAL | 0 refills | Status: DC

## 2016-09-18 MED ORDER — PENICILLIN V POTASSIUM 500 MG PO TABS: 500.0000 mg | ORAL_TABLET | Freq: Four times a day (QID) | ORAL | 0 refills | Status: DC

## 2016-09-18 NOTE — ED Notes (Signed)
PA advised of low pulse -45-49

## 2016-09-18 NOTE — ED Provider Notes (Signed)
[facility] Provider Note   CSN: [id number] Arrival date & time: 09/18/16  1145   By signing my name below, I, Soijett Blue, attest that this documentation has been prepared under the direction and in the presence of Sharilyn Sites, PA-C Electronically Signed: Soijett Blue, ED Scribe. 09/18/16. 1:32 PM.  History   Chief Complaint Chief Complaint  Patient presents with  . Dental Pain    HPI Melanie Hancock is a 21 y.o. female who presents to the Emergency Department complaining of right sided dental pain onset yesterday. Pt notes that she has had a dental filling to a right sided tooth in the past that she states broke. 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 She has tried 800 mg ibuprofen and tylenol extra strength with no relief of her symptoms. She denies fever, chills, drainage, and any other symptoms.   The history is provided by the patient. No language interpreter was used.    Past Medical History:  Diagnosis Date  . Anemia   . Anxiety   . Chronic back pain   . Depression   . Infection    UTI  . Post partum depression   . Pyelonephritis   . UTI (lower urinary tract infection)     Patient Active Problem List   Diagnosis Date Noted  . Vaginal delivery 02/04/2016  . History of postpartum depression, currently pregnant 01/23/2016  . Iron deficiency anemia 01/02/2016  . 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 Anxiety 03/28/2014  . Chronic back pain 03/28/2014  . Hx pyelonephritis 2014 05/26/2013    Past Surgical History:  Procedure Laterality Date  . DILATION AND CURETTAGE OF UTERUS    . INDUCED ABORTION    . WISDOM TOOTH EXTRACTION      OB History    Gravida Para Term Preterm AB Living   5 3 3   2 3    SAB TAB Ectopic Multiple Live Births   1 1   0 3       Home Medications    Prior to Admission medications   Medication Sig Start Date End Date Taking? Authorizing Provider  medroxyPROGESTERone (DEPO-PROVERA) 150  MG/ML injection Inject 150 mg into the muscle every 3 (three) months. DID NOT OBTAIN December INJECTION    Historical Provider, MD  metroNIDAZOLE (FLAGYL) 500 MG tablet Take 1 tablet (500 mg total) by mouth 2 (two) times daily. X 7 days 08/21/16   Hayden Rasmussen, NP    Family History Family History  Problem Relation Age of Onset  . Arthritis Mother   . Arthritis Maternal Grandmother   . Asthma Son   . Alcohol abuse Neg Hx     Social History Social History  Substance Use Topics  . Smoking status: Former Smoker    Quit date: 01/21/2014  . Smokeless tobacco: Never Used  . Alcohol use No     Allergies   Patient has no known allergies.   Review of Systems Review of Systems  Constitutional: Negative for chills and fever.  HENT: Positive for dental problem (right sided).        No drainage  All other systems reviewed and are negative.    Physical Exam Updated Vital Signs BP 109/63 (BP Location: Left Arm)   Pulse 60   Temp 97.5 F (36.4 C) (Oral)   Resp 17   Ht 5' 5.5" (1.664 m)   Wt 173 lb (78.5 kg)   LMP 09/09/2016   SpO2  99%   BMI 28.35 kg/m   Physical Exam  Constitutional: She is oriented to person, place, and time. She appears well-developed and well-nourished.  HENT:  Head: Normocephalic and atraumatic.  Mouth/Throat: Oropharynx is clear and moist and mucous membranes are normal. No trismus in the jaw. Abnormal dentition. No dental abscesses.  Teeth largely in good dentition, right lower molar broken along posterior aspect, surrounding gingiva mildly swollen, handling secretions appropriately, no trismus, no facial or neck swelling, normal phonation without stridor  Eyes: Conjunctivae and EOM are normal. Pupils are equal, round, and reactive to light.  Neck: Normal range of motion.  Cardiovascular: Normal rate, regular rhythm and normal heart sounds.   Pulmonary/Chest: Effort normal and breath sounds normal.  Abdominal: Soft. Bowel sounds are normal.    Musculoskeletal: Normal range of motion.  Neurological: She is alert and oriented to person, place, and time.  Skin: Skin is warm and dry.  Psychiatric: She has a normal mood and affect.  Nursing note and vitals reviewed.    ED Treatments / Results  DIAGNOSTIC STUDIES: Oxygen Saturation is 99% on RA, nl by my interpretation.    COORDINATION OF CARE: 1:21 PM Discussed treatment plan with pt at bedside which includes referral and follow up with dentist, abx Rx, and pt agreed to plan.   Procedures Procedures (including critical care time)  Medications Ordered in ED Medications - No data to display   Initial Impression / Assessment and Plan / ED Course  I have reviewed the triage vital signs and the nursing notes.  21 year old female here with right lower dental pain. She does have evidence of broken tooth on exam. Surrounding gums are mildly swollen without definitive abscess. She is handling secretions well, normal phonation without stridor. No facial or neck swelling. Symptoms not clinically concerning for Ludwig's angina. Patient restarted on antibiotics and referred to dentist for follow-up.  Given dental resource guide and so dentist on call today.  Discussed plan with patient, he/she acknowledged understanding and agreed with plan of care.  Return precautions given for new or worsening symptoms.  Final Clinical Impressions(s) / ED Diagnoses   Final diagnoses:  Pain, dental    New Prescriptions New Prescriptions   LIDOCAINE (XYLOCAINE) 2 % SOLUTION    Apply to affected tooth topically as needed for pain.   NAPROXEN (NAPROSYN) 500 MG TABLET    Take 1 tablet (500 mg total) by mouth 2 (two) times daily with a meal.   PENICILLIN V POTASSIUM (VEETID) 500 MG TABLET    Take 1 tablet (500 mg total) by mouth 4 (four) times daily.   I personally performed the services described in this documentation, which was scribed in my presence. The recorded information has been reviewed and is  accurate.    Garlon HatchetLisa M Anina Schnake, PA-C 09/18/16 1345    Gwyneth SproutWhitney Plunkett, MD 09/19/16 2136

## 2016-09-18 NOTE — ED Triage Notes (Signed)
Patient c/o right sided mouth pain since yesterday. Patient unsure if related to teeth or her gums. Patient had one wisdom tooth pulled last year on right side but unsure if top or bottom. Patient states that she doesn't have a dentist around here to make an appt with .

## 2016-09-18 NOTE — Discharge Instructions (Signed)
Take the prescribed medication as directed to help with pain and prevent infection. Unfortunately, there is not a dentist on call today, but I have provided resource guide so you can try to schedule an appt with local clinic. Return to the ED for new or worsening symptoms.

## 2016-10-03 ENCOUNTER — Encounter (HOSPITAL_COMMUNITY): Payer: Self-pay | Admitting: Emergency Medicine

## 2016-10-03 ENCOUNTER — Ambulatory Visit (HOSPITAL_COMMUNITY)
Admission: EM | Admit: 2016-10-03 | Discharge: 2016-10-03 | Disposition: A | Discharge status: Home / Self Care | Payer: Medicaid Other | Attending: Internal Medicine | Admitting: Internal Medicine

## 2016-10-03 DIAGNOSIS — F329 Major depressive disorder, single episode, unspecified: Secondary | ICD-10-CM | POA: Diagnosis not present

## 2016-10-03 DIAGNOSIS — Z811 Family history of alcohol abuse and dependence: Secondary | ICD-10-CM | POA: Insufficient documentation

## 2016-10-03 DIAGNOSIS — R300 Dysuria: Secondary | ICD-10-CM | POA: Diagnosis not present

## 2016-10-03 DIAGNOSIS — Z3200 Encounter for pregnancy test, result unknown: Secondary | ICD-10-CM

## 2016-10-03 DIAGNOSIS — Z87891 Personal history of nicotine dependence: Secondary | ICD-10-CM | POA: Diagnosis not present

## 2016-10-03 DIAGNOSIS — G8929 Other chronic pain: Secondary | ICD-10-CM | POA: Diagnosis not present

## 2016-10-03 DIAGNOSIS — Z8261 Family history of arthritis: Secondary | ICD-10-CM | POA: Diagnosis not present

## 2016-10-03 DIAGNOSIS — M545 Low back pain, unspecified: Secondary | ICD-10-CM

## 2016-10-03 DIAGNOSIS — Z9889 Other specified postprocedural states: Secondary | ICD-10-CM | POA: Insufficient documentation

## 2016-10-03 DIAGNOSIS — F419 Anxiety disorder, unspecified: Secondary | ICD-10-CM | POA: Diagnosis not present

## 2016-10-03 DIAGNOSIS — Z79899 Other long term (current) drug therapy: Secondary | ICD-10-CM | POA: Insufficient documentation

## 2016-10-03 DIAGNOSIS — D509 Iron deficiency anemia, unspecified: Secondary | ICD-10-CM | POA: Insufficient documentation

## 2016-10-03 DIAGNOSIS — K0889 Other specified disorders of teeth and supporting structures: Secondary | ICD-10-CM | POA: Insufficient documentation

## 2016-10-03 LAB — POCT URINALYSIS DIP (DEVICE)
Bilirubin Urine: NEGATIVE
Glucose, UA: NEGATIVE mg/dL
Hgb urine dipstick: NEGATIVE
Ketones, ur: NEGATIVE mg/dL
Leukocytes, UA: NEGATIVE
Nitrite: NEGATIVE
Protein, ur: NEGATIVE mg/dL
Specific Gravity, Urine: 1.015 (ref 1.005–1.030)
Urobilinogen, UA: 0.2 mg/dL (ref 0.0–1.0)
pH: 7.5 (ref 5.0–8.0)

## 2016-10-03 LAB — HCG, SERUM, QUALITATIVE: Preg, Serum: NEGATIVE

## 2016-10-03 MED ORDER — KETOROLAC TROMETHAMINE 10 MG PO TABS: 10.0000 mg | ORAL_TABLET | Freq: Four times a day (QID) | ORAL | 0 refills | Status: DC | PRN

## 2016-10-03 MED ORDER — KETOROLAC TROMETHAMINE 60 MG/2ML IM SOLN
60.0000 mg | Freq: Once | INTRAMUSCULAR | Status: AC
  Administered 2016-10-03: 60 mg via INTRAMUSCULAR

## 2016-10-03 MED ORDER — METHOCARBAMOL 500 MG PO TABS: 500.0000 mg | ORAL_TABLET | Freq: Two times a day (BID) | ORAL | 0 refills | Status: DC

## 2016-10-03 MED ORDER — KETOROLAC TROMETHAMINE 60 MG/2ML IM SOLN
INTRAMUSCULAR | Status: AC
  Filled 2016-10-03: qty 2

## 2016-10-03 NOTE — ED Provider Notes (Signed)
[facility]    CSN: [id number] Arrival date & time: 10/03/16  1003     History   Chief Complaint Chief Complaint  Patient presents with  . Dental Pain  . Dysuria    HPI Melanie Hancock is a 21 y.o. female. She presents with exacerbation of chronic low back pain, ongoing dental pain after wisdom tooth extraction, and urinary discomfort.  She is concerned about pregnancy as she was several days late for her most recent Depo Provera injection.  She is taking penicillin for possible dental infection.  She needs a work note.  No fever, no malaise.  No weakness/clumsiness of legs, no loss of sensation, no change in bowel/bladder function.    HPI  Past Medical History:  Diagnosis Date  . Anemia   . Anxiety   . Chronic back pain   . Depression   . Infection    UTI  . Post partum depression   . Pyelonephritis   . UTI (lower urinary tract infection)     Patient Active Problem List   Diagnosis Date Noted  . Vaginal delivery 02/04/2016  . History of postpartum depression, currently pregnant 01/23/2016  . Iron deficiency anemia 01/02/2016  . 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 Anxiety 03/28/2014  . Chronic back pain 03/28/2014  . Hx pyelonephritis 2014 05/26/2013    Past Surgical History:  Procedure Laterality Date  . DILATION AND CURETTAGE OF UTERUS    . INDUCED ABORTION    . WISDOM TOOTH EXTRACTION      OB History    Gravida Para Term Preterm AB Living   5 3 3   2 3    SAB TAB Ectopic Multiple Live Births   1 1   0 3       Home Medications    Prior to Admission medications   Medication Sig Start Date End Date Taking? Authorizing Provider  HYDROcodone-acetaminophen (NORCO/VICODIN) 5-325 MG tablet Take 1 tablet by mouth every 6 (six) hours as needed for moderate pain.   Yes Historical Provider, MD  ibuprofen (ADVIL,MOTRIN) 800 MG tablet Take 800 mg by mouth every 8 (eight) hours as needed.   Yes Historical Provider, MD  medroxyPROGESTERone (DEPO-PROVERA)  150 MG/ML injection Inject 150 mg into the muscle every 3 (three) months. DID NOT OBTAIN December INJECTION   Yes Historical Provider, MD  penicillin v potassium (VEETID) 500 MG tablet Take 1 tablet (500 mg total) by mouth 4 (four) times daily. 09/18/16  Yes Garlon HatchetLisa M Sanders, PA-C  ketorolac (TORADOL) 10 MG tablet Take 1 tablet (10 mg total) by mouth every 6 (six) hours as needed. 10/03/16   Eustace MooreLaura W Gwenetta Devos, MD  methocarbamol (ROBAXIN) 500 MG tablet Take 1 tablet (500 mg total) by mouth 2 (two) times daily. 10/03/16   Eustace MooreLaura W Lealand Elting, MD    Family History Family History  Problem Relation Age of Onset  . Arthritis Mother   . Arthritis Maternal Grandmother   . Asthma Son   . Alcohol abuse Neg Hx     Social History Social History  Substance Use Topics  . Smoking status: Former Smoker    Quit date: 01/21/2014  . Smokeless tobacco: Never Used  . Alcohol use No     Allergies   Patient has no known allergies.   Review of Systems Review of Systems  All other systems reviewed and are negative.    Physical Exam Triage Vital Signs ED Triage Vitals [10/03/16 1041]  Enc Vitals Group  BP (!) 112/50     Pulse Rate (!) 53     Resp 16     Temp 98.3 F (36.8 C)     Temp Source Oral     SpO2 99 %     Weight      Height      Pain Score 10   Updated Vital Signs BP (!) 112/50 (BP Location: Right Arm)   Pulse (!) 53   Temp 98.3 F (36.8 C) (Oral)   Resp 16   LMP 09/09/2016   SpO2 99%   Physical Exam  Constitutional: She is oriented to person, place, and time. No distress.  Alert, nicely groomed Sitting up on end of exam table  HENT:  Head: Atraumatic.  Lower left gingiva slightly swollen/inflamed, no drainage evident.  Healing extraction site.  Eyes:  Conjugate gaze, no eye redness/drainage  Neck: Neck supple.  Cardiovascular: Normal rate.   Pulmonary/Chest: No respiratory distress.  Lungs clear, symmetric breath sounds  Abdominal: She exhibits no distension.    Musculoskeletal: Normal range of motion.  No leg swelling  Neurological: She is alert and oriented to person, place, and time.  Walked into urgent care independently, able to climb on/off exam table.   Skin: Skin is warm and dry.  No cyanosis  Nursing note and vitals reviewed.    UC Treatments / Results  Labs  Results for orders placed or performed during the hospital encounter of 10/03/16  hCG, serum, qualitative  Result Value Ref Range   Preg, Serum NEGATIVE NEGATIVE  POCT urinalysis dip (device)  Result Value Ref Range   Glucose, UA NEGATIVE NEGATIVE mg/dL   Bilirubin Urine NEGATIVE NEGATIVE   Ketones, ur NEGATIVE NEGATIVE mg/dL   Specific Gravity, Urine 1.015 1.005 - 1.030   Hgb urine dipstick NEGATIVE NEGATIVE   pH 7.5 5.0 - 8.0   Protein, ur NEGATIVE NEGATIVE mg/dL   Urobilinogen, UA 0.2 0.0 - 1.0 mg/dL   Nitrite NEGATIVE NEGATIVE   Leukocytes, UA NEGATIVE NEGATIVE    Procedures Procedures (including critical care time)  Medications Ordered in UC Medications  ketorolac (TORADOL) injection 60 mg (60 mg Intramuscular Given 10/03/16 1139)    Final Clinical Impressions(s) / UC Diagnoses   Final diagnoses:  Pain, dental  Chronic low back pain without sciatica, unspecified back pain laterality  Encounter for pregnancy test, result unknown   Prescriptions for toradol (ketorolac) for dental and back pain was sent to the [facility] on [address]  Prescription for robaxin (muscle relaxer) was also sent.  Pregnancy test is pending; we will call your friend at [phone] and leave a message if the test is positive.  Note for work given, can return to work today.    New Prescriptions New Prescriptions   KETOROLAC (TORADOL) 10 MG TABLET    Take 1 tablet (10 mg total) by mouth every 6 (six) hours as needed.   METHOCARBAMOL (ROBAXIN) 500 MG TABLET    Take 1 tablet (500 mg total) by mouth 2 (two) times daily.     Eustace Moore, MD 10/04/16 2252

## 2016-10-03 NOTE — ED Triage Notes (Signed)
[facility] a [facility] of dental pain form an oral surgery last week. The patient stated that she is out of pain medicine and has not contacted the dentist.  The patient also complained of low back pain as well as some dysuria. The patient also requested a serum pregnancy test.

## 2016-10-03 NOTE — Discharge Instructions (Addendum)
Prescriptions for toradol (ketorolac) for dental and back pain was sent to the [facility] on [address]  Prescription for robaxin (muscle relaxer) was also sent.  Pregnancy test is pending; we will call your friend at [phone] and leave a message if the test is positive.  Note for work given, can return to work today.

## 2016-10-30 ENCOUNTER — Ambulatory Visit (HOSPITAL_COMMUNITY)
Admission: EM | Admit: 2016-10-30 | Discharge: 2016-10-30 | Disposition: A | Discharge status: Home / Self Care | Payer: Medicaid Other | Attending: Internal Medicine | Admitting: Internal Medicine

## 2016-10-30 ENCOUNTER — Encounter (HOSPITAL_COMMUNITY): Payer: Self-pay | Admitting: Family Medicine

## 2016-10-30 DIAGNOSIS — F419 Anxiety disorder, unspecified: Secondary | ICD-10-CM | POA: Diagnosis not present

## 2016-10-30 DIAGNOSIS — J310 Chronic rhinitis: Secondary | ICD-10-CM | POA: Insufficient documentation

## 2016-10-30 DIAGNOSIS — F329 Major depressive disorder, single episode, unspecified: Secondary | ICD-10-CM | POA: Diagnosis not present

## 2016-10-30 DIAGNOSIS — Z9889 Other specified postprocedural states: Secondary | ICD-10-CM | POA: Diagnosis not present

## 2016-10-30 DIAGNOSIS — D509 Iron deficiency anemia, unspecified: Secondary | ICD-10-CM | POA: Diagnosis not present

## 2016-10-30 DIAGNOSIS — G8929 Other chronic pain: Secondary | ICD-10-CM | POA: Diagnosis not present

## 2016-10-30 DIAGNOSIS — J309 Allergic rhinitis, unspecified: Secondary | ICD-10-CM | POA: Diagnosis not present

## 2016-10-30 DIAGNOSIS — Z87891 Personal history of nicotine dependence: Secondary | ICD-10-CM | POA: Insufficient documentation

## 2016-10-30 DIAGNOSIS — H1013 Acute atopic conjunctivitis, bilateral: Secondary | ICD-10-CM | POA: Diagnosis not present

## 2016-10-30 DIAGNOSIS — R0981 Nasal congestion: Secondary | ICD-10-CM | POA: Diagnosis not present

## 2016-10-30 DIAGNOSIS — N760 Acute vaginitis: Secondary | ICD-10-CM | POA: Insufficient documentation

## 2016-10-30 DIAGNOSIS — Z79899 Other long term (current) drug therapy: Secondary | ICD-10-CM | POA: Diagnosis not present

## 2016-10-30 DIAGNOSIS — Z8261 Family history of arthritis: Secondary | ICD-10-CM | POA: Diagnosis not present

## 2016-10-30 LAB — POCT URINALYSIS DIP (DEVICE)
Bilirubin Urine: NEGATIVE
Glucose, UA: NEGATIVE mg/dL
Hgb urine dipstick: NEGATIVE
Ketones, ur: NEGATIVE mg/dL
Leukocytes, UA: NEGATIVE
Nitrite: NEGATIVE
Protein, ur: NEGATIVE mg/dL
Specific Gravity, Urine: 1.02 (ref 1.005–1.030)
Urobilinogen, UA: 0.2 mg/dL (ref 0.0–1.0)
pH: 8.5 — ABNORMAL HIGH (ref 5.0–8.0)

## 2016-10-30 LAB — POCT PREGNANCY, URINE: Preg Test, Ur: NEGATIVE

## 2016-10-30 MED ORDER — TRIAMCINOLONE ACETONIDE 55 MCG/ACT NA AERO: 2.0000 | INHALATION_SPRAY | Freq: Every day | NASAL | 0 refills | Status: DC

## 2016-10-30 MED ORDER — CETIRIZINE-PSEUDOEPHEDRINE ER 5-120 MG PO TB12: 1.0000 | ORAL_TABLET | Freq: Every day | ORAL | 0 refills | Status: DC

## 2016-10-30 MED ORDER — PREDNISONE 50 MG PO TABS: 50.0000 mg | ORAL_TABLET | Freq: Every day | ORAL | 0 refills | Status: DC

## 2016-10-30 MED ORDER — METHYLPREDNISOLONE SODIUM SUCC 125 MG IJ SOLR
INTRAMUSCULAR | Status: AC
  Filled 2016-10-30: qty 2

## 2016-10-30 MED ORDER — METHYLPREDNISOLONE SODIUM SUCC 125 MG IJ SOLR
125.0000 mg | Freq: Once | INTRAMUSCULAR | Status: AC
  Administered 2016-10-30: 125 mg via INTRAMUSCULAR

## 2016-10-30 NOTE — Discharge Instructions (Addendum)
Tests are pending to see if a treatable cause of vaginal discharge/irritation is present.  The urgent care will contact you if additional treatment is necessary.   Itchy/swollen eyes could be from environmental allergies, or allergic reaction to something you were exposed to.  Treatment is similar either way.  Injection of solumedrol (steroid) was given at the urgent care and prescriptions for prednisone, nasal steroid spray, and antihistamines were [facility] to [facility] on  [address]  Might also try an otc eye drop called zaditor (ketotoifen).  Recheck if not starting to improve in several days.

## 2016-10-30 NOTE — ED Triage Notes (Signed)
Pt here for possible allergic reaction. Pt has right orbital swelling. sts now spreading to left eye. sts watering and itchy and painful. sts blurred vision in eye.

## 2016-10-30 NOTE — ED Provider Notes (Signed)
[facility]    CSN: [id number] Arrival date & time: 10/30/16  1711     History   Chief Complaint Chief Complaint  Patient presents with  . Allergic Reaction    HPI Melanie Hancock is a 21 y.o. female. She presents today with a couple days history of runny/congested nose, sinus congestion, and the onset today of itchy puffy eyes. In the hour or so before presentation the right eyelids actually swelled fairly dramatically, although it is improving now.  No new exposures.    HPI  Past Medical History:  Diagnosis Date  . Anemia   . Anxiety   . Chronic back pain   . Depression   . Infection    UTI  . Post partum depression   . Pyelonephritis   . UTI (lower urinary tract infection)     Patient Active Problem List   Diagnosis Date Noted  . Vaginal delivery 02/04/2016  . History of postpartum depression, currently pregnant 01/23/2016  . Iron deficiency anemia 01/02/2016  . 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 Anxiety 03/28/2014  . Chronic back pain 03/28/2014  . Hx pyelonephritis 2014 05/26/2013    Past Surgical History:  Procedure Laterality Date  . DILATION AND CURETTAGE OF UTERUS    . INDUCED ABORTION    . WISDOM TOOTH EXTRACTION      OB History    Gravida Para Term Preterm AB Living   5 3 3   2 3    SAB TAB Ectopic Multiple Live Births   1 1   0 3       Home Medications    Prior to Admission medications   Medication Sig Start Date End Date Taking? Authorizing Provider  baclofen (LIORESAL) 10 MG tablet Take 1 tablet (10 mg total) by mouth 3 (three) times daily as needed for muscle spasms. 10/31/16   Tyrone Nine, MD  cetirizine-pseudoephedrine (ZYRTEC-D) 5-120 MG tablet Take 1 tablet by mouth daily. 10/30/16   Eustace Moore, MD  ipratropium (ATROVENT) 0.03 % nasal spray Place 2 sprays into both nostrils 3 (three) times daily as needed for rhinitis. 10/31/16   Tyrone Nine, MD  medroxyPROGESTERone (DEPO-PROVERA) 150 MG/ML injection Inject 150 mg into  the muscle every 3 (three) months. DID NOT OBTAIN December INJECTION    Historical Provider, MD  metroNIDAZOLE (FLAGYL) 500 MG tablet Take 1 tablet (500 mg total) by mouth 2 (two) times daily. 10/31/16   Eustace Moore, MD    Family History Family History  Problem Relation Age of Onset  . Arthritis Mother   . Arthritis Maternal Grandmother   . Asthma Son   . Alcohol abuse Neg Hx     Social History Social History  Substance Use Topics  . Smoking status: Former Smoker    Quit date: 01/21/2014  . Smokeless tobacco: Never Used  . Alcohol use No     Allergies   Patient has no known allergies.   Review of Systems Review of Systems  All other systems reviewed and are negative.    Physical Exam Triage Vital Signs ED Triage Vitals  Enc Vitals Group     BP 10/30/16 1726 116/61     Pulse Rate 10/30/16 1726 (!) 53     Resp 10/30/16 1726 18     Temp 10/30/16 1726 98.5 F (36.9 C)     Temp src --      SpO2 10/30/16 1726 100 %     Weight --  Height --      Pain Score 10/30/16 1728 4     Pain Loc --    Updated Vital Signs BP 116/61   Pulse (!) 53   Temp 98.5 F (36.9 C)   Resp 18   SpO2 100%   Visual Acuity Right Eye Distance: 20/40 Left Eye Distance: 20/30 Bilateral Distance: 20/25  Physical Exam  Constitutional: She is oriented to person, place, and time. No distress.  Alert, nicely groomed  HENT:  Head: Atraumatic.  Bilateral TMs are quite dull, no erythema Marked nasal congestion bilaterally Voice sounds quite congested  Eyes:  Conjugate gaze, mildly injected conjunctivae bilaterally, with scant watery discharge Bilateral upper and lower lids are slightly inflamed and puffy, right upper lid most significantly.   Neck: Neck supple.  Cardiovascular: Normal rate and regular rhythm.   Pulmonary/Chest: No respiratory distress. She has no wheezes. She has no rales.  Lungs clear, symmetric breath sounds  Abdominal: She exhibits no distension.    Musculoskeletal: Normal range of motion.  No leg swelling  Neurological: She is alert and oriented to person, place, and time.  Skin: Skin is warm and dry.  No cyanosis  Nursing note and vitals reviewed.    UC Treatments / Results  Labs Results for orders placed or performed during the hospital encounter of 10/30/16  POCT urinalysis dip (device)  Result Value Ref Range   Glucose, UA NEGATIVE NEGATIVE mg/dL   Bilirubin Urine NEGATIVE NEGATIVE   Ketones, ur NEGATIVE NEGATIVE mg/dL   Specific Gravity, Urine 1.020 1.005 - 1.030   Hgb urine dipstick NEGATIVE NEGATIVE   pH 8.5 (H) 5.0 - 8.0   Protein, ur NEGATIVE NEGATIVE mg/dL   Urobilinogen, UA 0.2 0.0 - 1.0 mg/dL   Nitrite NEGATIVE NEGATIVE   Leukocytes, UA NEGATIVE NEGATIVE  Pregnancy, urine POC  Result Value Ref Range   Preg Test, Ur NEGATIVE NEGATIVE  Cervicovaginal ancillary only  Result Value Ref Range   Chlamydia Negative    Neisseria gonorrhea Negative   Cervicovaginal ancillary only  Result Value Ref Range   Wet Prep (BD Affirm) **POSITIVE for Gardnerella** (A)     Procedures Procedures (including critical care time)  Medications Ordered in UC Medications  methylPREDNISolone sodium succinate (SOLU-MEDROL) 125 mg/2 mL injection 125 mg (125 mg Intramuscular Given 10/30/16 1856)    Final Clinical Impressions(s) / UC Diagnoses   Final diagnoses:  Allergic conjunctivitis and rhinitis, bilateral  Acute vaginitis   Tests are pending to see if a treatable cause of vaginal discharge/irritation is present.  The urgent care will contact you if additional treatment is necessary.   Itchy/swollen eyes could be from environmental allergies, or allergic reaction to something you were exposed to.  Treatment is similar either way.  Injection of solumedrol (steroid) was given at the urgent care and prescriptions for prednisone, nasal steroid spray, and antihistamines were sent to the [facility] on  [address]  Might also try an otc eye  drop called zaditor (ketotoifen).  Recheck if not starting to improve in several days.    New Prescriptions Discharge Medication List as of 10/30/2016  6:57 PM    START taking these medications   Details  cetirizine-pseudoephedrine (ZYRTEC-D) 5-120 MG tablet Take 1 tablet by mouth daily., Starting Tue 10/30/2016, Normal    predniSONE (DELTASONE) 50 MG tablet Take 1 tablet (50 mg total) by mouth daily., Starting Tue 10/30/2016, Normal    triamcinolone (NASACORT AQ) 55 MCG/ACT AERO nasal inhaler Place 2 sprays into the nose  daily., Starting Tue 10/30/2016, Normal         Eustace MooreLaura W Oluwademilade Mckiver, MD 11/02/16 2112

## 2016-10-30 NOTE — ED Notes (Signed)
Bed: UC08 Expected date: 10/30/16 Expected time: 5:00 PM Means of arrival:  Comments:

## 2016-10-31 ENCOUNTER — Encounter (HOSPITAL_COMMUNITY): Payer: Self-pay | Admitting: *Deleted

## 2016-10-31 ENCOUNTER — Ambulatory Visit (HOSPITAL_COMMUNITY)
Admission: EM | Admit: 2016-10-31 | Discharge: 2016-10-31 | Disposition: A | Discharge status: Home / Self Care | Payer: Medicaid Other | Attending: Family Medicine | Admitting: Family Medicine

## 2016-10-31 ENCOUNTER — Telehealth: Payer: Self-pay | Admitting: Internal Medicine

## 2016-10-31 DIAGNOSIS — T380X5A Adverse effect of glucocorticoids and synthetic analogues, initial encounter: Secondary | ICD-10-CM

## 2016-10-31 DIAGNOSIS — T7840XD Allergy, unspecified, subsequent encounter: Secondary | ICD-10-CM

## 2016-10-31 DIAGNOSIS — G720 Drug-induced myopathy: Secondary | ICD-10-CM | POA: Diagnosis not present

## 2016-10-31 LAB — CERVICOVAGINAL ANCILLARY ONLY
Chlamydia: NEGATIVE
Neisseria Gonorrhea: NEGATIVE
Wet Prep (BD Affirm): POSITIVE — AB

## 2016-10-31 MED ORDER — IPRATROPIUM BROMIDE 0.03 % NA SOLN: 2.0000 | Freq: Three times a day (TID) | NASAL | 0 refills | Status: DC | PRN

## 2016-10-31 MED ORDER — METRONIDAZOLE 500 MG PO TABS: 500.0000 mg | ORAL_TABLET | Freq: Two times a day (BID) | ORAL | 0 refills | Status: DC

## 2016-10-31 MED ORDER — BACLOFEN 10 MG PO TABS: 10.0000 mg | ORAL_TABLET | Freq: Three times a day (TID) | ORAL | 0 refills | Status: DC | PRN

## 2016-10-31 NOTE — ED Triage Notes (Signed)
Allergic  Reaction      yest     Headache  And  Back  Pain  This  Am   Around  530   Am             Speaking  In  Complete   sentances  And  Is  In no   Acute  Distress

## 2016-10-31 NOTE — Telephone Encounter (Signed)
Please let patient know that test for gardnerella (bacterial vaginosis) was positive.  Rx metronidazole sent to [facility] [address] [facility] [place].  Recheck for further evaluation if symptoms are not improving.  LM

## 2016-10-31 NOTE — ED Provider Notes (Signed)
[facility]    CSN: [id number] Arrival date & time: 10/31/16  1023     History   Chief Complaint Chief Complaint  Patient presents with  . Allergic Reaction    HPI Melanie Hancock is a 21 y.o. female presenting for back spasm and night sweats. She was seen yesterday for swelling around the right eye with some itching, prescribed antihistamine, steroid nasal spray and prednisone as well as being given solumedrol x1 in clinic. She reports resolution of eye swelling and itching. Went to the pharmacy to fill prescriptions and medicaid would only cover prednisone so that is all she filled. Took it last night around 12:30am and woke up this morning with back stiffness and acute on chronic lower back pain which is severe, nonradiating, aching, and not improved with ibuprofen. She presents for these concerns and denies dyspnea, stridor, wheezing, voice changes, vision changes, hearing changes, photophobia, neck stiffness/pain, and fever.   HPI  Past Medical History:  Diagnosis Date  . Anemia   . Anxiety   . Chronic back pain   . Depression   . Infection    UTI  . Post partum depression   . Pyelonephritis   . UTI (lower urinary tract infection)     Patient Active Problem List   Diagnosis Date Noted  . Vaginal delivery 02/04/2016  . History of postpartum depression, currently pregnant 01/23/2016  . Iron deficiency anemia 01/02/2016  . 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 Anxiety 03/28/2014  . Chronic back pain 03/28/2014  . Hx pyelonephritis 2014 05/26/2013    Past Surgical History:  Procedure Laterality Date  . DILATION AND CURETTAGE OF UTERUS    . INDUCED ABORTION    . WISDOM TOOTH EXTRACTION      OB History    Gravida Para Term Preterm AB Living   5 3 3   2 3    SAB TAB Ectopic Multiple Live Births   1 1   0 3       Home Medications    Prior to Admission medications   Medication Sig Start Date End Date Taking? Authorizing Provider  baclofen (LIORESAL) 10 MG  tablet Take 1 tablet (10 mg total) by mouth 3 (three) times daily as needed for muscle spasms. 10/31/16   Tyrone Nineyan B Carzell Saldivar, MD  cetirizine-pseudoephedrine (ZYRTEC-D) 5-120 MG tablet Take 1 tablet by mouth daily. 10/30/16   Eustace MooreLaura W Murray, MD  ipratropium (ATROVENT) 0.03 % nasal spray Place 2 sprays into both nostrils 3 (three) times daily as needed for rhinitis. 10/31/16   Tyrone Nineyan B Aika Brzoska, MD  medroxyPROGESTERone (DEPO-PROVERA) 150 MG/ML injection Inject 150 mg into the muscle every 3 (three) months. DID NOT OBTAIN December INJECTION    Historical Provider, MD    Family History Family History  Problem Relation Age of Onset  . Arthritis Mother   . Arthritis Maternal Grandmother   . Asthma Son   . Alcohol abuse Neg Hx     Social History Social History  Substance Use Topics  . Smoking status: Former Smoker    Quit date: 01/21/2014  . Smokeless tobacco: Never Used  . Alcohol use No     Allergies   Patient has no known allergies.   Review of Systems Review of Systems Pt denies any current bowel/bladder problems, fever, chills, unintentional weight loss, night time awakenings secondary to pain, weakness in one or both legs  Physical Exam Triage Vital Signs ED Triage Vitals  Enc Vitals Group  BP 10/31/16 1158 109/75     Pulse Rate 10/31/16 1158 78     Resp 10/31/16 1158 18     Temp 10/31/16 1158 98.6 F (37 C)     Temp Source 10/31/16 1158 Oral     SpO2 10/31/16 1158 100 %     Weight --      Height --      Head Circumference --      Peak Flow --      Pain Score 10/31/16 1157 10     Pain Loc --      Pain Edu? --      Excl. in GC? --    No data found.   Updated Vital Signs BP 109/75 (BP Location: Right Arm)   Pulse 78   Temp 98.6 F (37 C) (Oral)   Resp 18   SpO2 100%   Visual Acuity Right Eye Distance:   Left Eye Distance:   Bilateral Distance:    Right Eye Near:   Left Eye Near:    Bilateral Near:     Physical Exam  Constitutional: She appears well-developed  and well-nourished. No distress.  HENT:  Head: Normocephalic and atraumatic.  Right Ear: External ear normal.  Left Ear: External ear normal.  Nose: Nose normal.  Mouth/Throat: Oropharynx is clear and moist. No oropharyngeal exudate.  Eyes: Conjunctivae and EOM are normal. Pupils are equal, round, and reactive to light.  fundi benign bilaterally. Conjunctivae not injected.  Neck: Normal range of motion. Neck supple. No JVD present.  Cardiovascular: Normal rate and regular rhythm.   No murmur heard. Pulmonary/Chest: Effort normal and breath sounds normal. No stridor. No respiratory distress. She has no wheezes.  Abdominal: Soft. There is no tenderness.  Musculoskeletal: She exhibits tenderness (paraspinal spasm and tenderness in lumbar area). She exhibits no edema.  Lymphadenopathy:    She has no cervical adenopathy.  Neurological: She is alert.  Skin: Skin is warm and dry. No rash noted.  Psychiatric: She has a normal mood and affect. Her behavior is normal.  Nursing note and vitals reviewed.    UC Treatments / Results  Labs (all labs ordered are listed, but only abnormal results are displayed) Labs Reviewed - No data to display  EKG  EKG Interpretation None       Radiology No results found.  Procedures Procedures (including critical care time)  Medications Ordered in UC Medications - No data to display   Initial Impression / Assessment and Plan / UC Course  I have reviewed the triage vital signs and the nursing notes.  Pertinent labs & imaging results that were available during my care of the patient were reviewed by me and considered in my medical decision making (see chart for details).     21 y.o. female with history of chronic low back pain presenting for back stiffness and pain as well as sweating as probable reaction to steroids prescribed for allergies with periocular swelling which has resolved. No evidence of respiratory distress.  - DC steroids, still  will have natural taper with solumedrol given last night - Told to fill zyrtec Rx for allergic symptoms, will also try ipratropium (on medicaid formulary) in lieu of nasacort (wasn't covered by medicaid) - Return precautions provided - Pt strongly desires non-OTC medication for back pain. Rx    Final Clinical Impressions(s) / UC Diagnoses   Final diagnoses:  Allergic reaction, subsequent encounter  Steroid-induced myopathy    New Prescriptions New Prescriptions   BACLOFEN (  LIORESAL) 10 MG TABLET    Take 1 tablet (10 mg total) by mouth 3 (three) times daily as needed for muscle spasms.   IPRATROPIUM (ATROVENT) 0.03 % NASAL SPRAY    Place 2 sprays into both nostrils 3 (three) times daily as needed for rhinitis.     Tyrone Nine, MD 10/31/16 [phone]

## 2016-10-31 NOTE — Discharge Instructions (Signed)
You had an intolerance (not true allergic reaction) to steroids, and should stop taking prednisone.  - Take atrovent intranasally and zyrtec by mouth for allergies - Take baclofen for back pain, use extreme caution due to drowsiness and do not operate a car or other heavy machinery while taking this.  - Return if you have trouble breathing.

## 2016-11-12 ENCOUNTER — Inpatient Hospital Stay (HOSPITAL_COMMUNITY)
Admission: AD | Admit: 2016-11-12 | Discharge: 2016-11-12 | Disposition: A | Discharge status: Home / Self Care | Payer: Medicaid Other | Source: Ambulatory Visit | Attending: Obstetrics and Gynecology | Admitting: Obstetrics and Gynecology

## 2016-11-12 ENCOUNTER — Encounter (HOSPITAL_COMMUNITY): Payer: Self-pay | Admitting: *Deleted

## 2016-11-12 DIAGNOSIS — Z87891 Personal history of nicotine dependence: Secondary | ICD-10-CM | POA: Insufficient documentation

## 2016-11-12 DIAGNOSIS — R1030 Lower abdominal pain, unspecified: Secondary | ICD-10-CM

## 2016-11-12 DIAGNOSIS — Z9889 Other specified postprocedural states: Secondary | ICD-10-CM | POA: Diagnosis not present

## 2016-11-12 DIAGNOSIS — Z811 Family history of alcohol abuse and dependence: Secondary | ICD-10-CM | POA: Diagnosis not present

## 2016-11-12 DIAGNOSIS — R4589 Other symptoms and signs involving emotional state: Secondary | ICD-10-CM | POA: Diagnosis not present

## 2016-11-12 DIAGNOSIS — Z79899 Other long term (current) drug therapy: Secondary | ICD-10-CM | POA: Diagnosis not present

## 2016-11-12 DIAGNOSIS — Z8261 Family history of arthritis: Secondary | ICD-10-CM | POA: Diagnosis not present

## 2016-11-12 DIAGNOSIS — Z3202 Encounter for pregnancy test, result negative: Secondary | ICD-10-CM | POA: Insufficient documentation

## 2016-11-12 DIAGNOSIS — G8929 Other chronic pain: Secondary | ICD-10-CM | POA: Insufficient documentation

## 2016-11-12 DIAGNOSIS — F419 Anxiety disorder, unspecified: Secondary | ICD-10-CM | POA: Diagnosis present

## 2016-11-12 DIAGNOSIS — R109 Unspecified abdominal pain: Secondary | ICD-10-CM | POA: Insufficient documentation

## 2016-11-12 DIAGNOSIS — F329 Major depressive disorder, single episode, unspecified: Secondary | ICD-10-CM | POA: Insufficient documentation

## 2016-11-12 LAB — CBC WITH DIFFERENTIAL/PLATELET
Basophils Absolute: 0 10*3/uL (ref 0.0–0.1)
Basophils Relative: 0 %
Eosinophils Absolute: 0.2 10*3/uL (ref 0.0–0.7)
Eosinophils Relative: 4 %
HCT: 37.3 % (ref 36.0–46.0)
Hemoglobin: 12.4 g/dL (ref 12.0–15.0)
Lymphocytes Relative: 45 %
Lymphs Abs: 2.1 10*3/uL (ref 0.7–4.0)
MCH: 28.8 pg (ref 26.0–34.0)
MCHC: 33.2 g/dL (ref 30.0–36.0)
MCV: 86.7 fL (ref 78.0–100.0)
Monocytes Absolute: 0.2 10*3/uL (ref 0.1–1.0)
Monocytes Relative: 3 %
Neutro Abs: 2.2 10*3/uL (ref 1.7–7.7)
Neutrophils Relative %: 48 %
Platelets: 298 10*3/uL (ref 150–400)
RBC: 4.3 MIL/uL (ref 3.87–5.11)
RDW: 13.2 % (ref 11.5–15.5)
WBC: 4.6 10*3/uL (ref 4.0–10.5)

## 2016-11-12 LAB — URINALYSIS, ROUTINE W REFLEX MICROSCOPIC
Bilirubin Urine: NEGATIVE
Glucose, UA: NEGATIVE mg/dL
Hgb urine dipstick: NEGATIVE
Ketones, ur: NEGATIVE mg/dL
Leukocytes, UA: NEGATIVE
Nitrite: NEGATIVE
Protein, ur: NEGATIVE mg/dL
Specific Gravity, Urine: 1.004 — ABNORMAL LOW (ref 1.005–1.030)
pH: 7 (ref 5.0–8.0)

## 2016-11-12 LAB — POCT PREGNANCY, URINE: Preg Test, Ur: NEGATIVE

## 2016-11-12 NOTE — Discharge Instructions (Signed)
Pregnancy Test Information °What is a pregnancy test? °A pregnancy test is used to detect the presence of human chorionic gonadotropin (hCG) in a sample of your urine or blood. hCG is a hormone produced by the cells of the placenta. The placenta is the organ that forms to nourish and support a developing baby. °This test requires a sample of either blood or urine. A pregnancy test determines whether you are pregnant or not. °How are pregnancy tests done? °Pregnancy tests are done using a home pregnancy test or having a blood or urine test done at your health care provider's office. °Home pregnancy tests require a urine sample. °· Most kits use a plastic testing device with a strip of paper that indicates whether there is hCG in your urine. °· Follow the test instructions very carefully. °· After you urinate on the test stick, markings will appear to let you know whether you are pregnant. °· For best results, use your first urine of the morning. That is when the concentration of hCG is highest. ° °Having a blood test to check for pregnancy requires a sample of blood drawn from a vein in your hand or arm. Your health care provider will send your sample to a lab for testing. Results of a pregnancy test will be positive or negative. °Is one type of pregnancy test better than another? °In some cases, a blood test will return a positive result even if a urine test was negative because blood tests are more sensitive. This means blood tests can detect hCG earlier than home pregnancy tests. °How accurate are home pregnancy tests? °Both types of pregnancy tests are very accurate. °· A blood test is about 98% accurate. °· When you are far enough along in your pregnancy and when used correctly, home pregnancy tests are equally accurate. ° °Can anything interfere with home pregnancy test results °It is possible for certain conditions to cause an inaccurate test result (false positive or false negative). °· A false positive is a  positive test result when you are not pregnant. This can happen if you: °? Are taking certain medicines, including anticonvulsants or tranquilizers. °? Have certain proteins in your blood. °· A false negative is a negative test result when you are pregnant. This can happen if you: °? Took the test before there was enough hCG to detect. A pregnancy test will not be positive in most women until 3-4 weeks after conception. °? Drank a lot of liquid before the test. Diluted urine samples can sometimes give an inaccurate result. °? Take certain medicines, such as water pills (diuretics) or some antihistamines. ° °What should I do if I have a positive pregnancy test? °If you have a positive pregnancy test, schedule an appointment with your health care provider. You might need additional testing to confirm the pregnancy. In the meantime, begin taking a prenatal vitamin, stop smoking, stop drinking alcohol, and do not use street drugs. °Talk to your health care provider about how to take care of yourself during your pregnancy. Ask about what to expect from the care you will need throughout pregnancy (prenatal care). °This information is not intended to replace advice given to you by your health care provider. Make sure you discuss any questions you have with your health care provider. °Document Released: 08/09/2003 Document Revised: 07/03/2016 Document Reviewed: 12/01/2013 °Elsevier Interactive Patient Education © 2017 [employer] ° °

## 2016-11-12 NOTE — MAU Note (Signed)
Patient c/o panic attacks; multiple today about 5. Having them since last week. States this has happened in the past when pregnant. On depo. Patient states she feels like she if pregnant; increased appetite, going to restroom more. Currently on medication for bacterial infection. Nausea +. Patient states has been seen and had a negative blood and urine pregnancy test ; "would like to go father to investigate pregnancy". Pain on lower left abdomen--comes and goes--worse with activity. +light headed/dizzy.

## 2016-11-12 NOTE — MAU Provider Note (Signed)
History     CSN: [id number]  Arrival date and time: 11/12/16 1447   First Provider Initiated Contact with Patient 11/12/16 1608      No chief complaint on file.  HPI  Melanie Hancock is a 21 y.o. female [phone] non pregnant female here in MAU with anxiety, and concerned about pregnancy. She says that every time she is pregnant they have to dig to "find" her pregnancy. She states that she will not leave the MAU until she gets a "blood test and ultrasound". She is receiving depo, says she took multiple pregnancy tests at home and they were all negative. She states she  "needs to have a blood test  And [place] today".  She has not called her GYN regarding these pregnancy concerns.   She is experiencing abdominal pain in the lower abdomen, the pain comes and goes. At times the pain is very sharp.Marland Kitchen    She was seen recently and prescribed flagyl for a bacterial infection and never filled the RX.  Is currently receiving depo. Last dose was beginning of Feb. [facility]   Patient is currently eating [employer] and drinking [employer] requesting to speak to the nursing supervisor.   OB History    Gravida Para Term Preterm AB Living   5 3 3   2 3    SAB TAB Ectopic Multiple Live Births   1 1   0 3      Past Medical History:  Diagnosis Date  . Anemia   . Anxiety   . Chronic back pain   . Depression   . Infection    UTI  . Post partum depression   . Pyelonephritis   . UTI (lower urinary tract infection)     Past Surgical History:  Procedure Laterality Date  . DILATION AND CURETTAGE OF UTERUS    . INDUCED ABORTION    . WISDOM TOOTH EXTRACTION      Family History  Problem Relation Age of Onset  . Arthritis Mother   . Arthritis Maternal Grandmother   . Asthma Son   . Alcohol abuse Neg Hx     Social History  Substance Use Topics  . Smoking status: Former Smoker    Quit date: 01/21/2014  . Smokeless tobacco: Never Used  . Alcohol use No     Allergies: No Known Allergies  Prescriptions Prior to Admission  Medication Sig Dispense Refill Last Dose  . baclofen (LIORESAL) 10 MG tablet Take 1 tablet (10 mg total) by mouth 3 (three) times daily as needed for muscle spasms. 6 each 0   . cetirizine-pseudoephedrine (ZYRTEC-D) 5-120 MG tablet Take 1 tablet by mouth daily. 30 tablet 0   . ipratropium (ATROVENT) 0.03 % nasal spray Place 2 sprays into both nostrils 3 (three) times daily as needed for rhinitis. 30 mL 0   . medroxyPROGESTERone (DEPO-PROVERA) 150 MG/ML injection Inject 150 mg into the muscle every 3 (three) months. DID NOT OBTAIN December INJECTION   Past Month at Unknown time  . metroNIDAZOLE (FLAGYL) 500 MG tablet Take 1 tablet (500 mg total) by mouth 2 (two) times daily. 14 tablet 0    Results for orders placed or performed during the hospital encounter of 11/12/16 (from the past 48 hour(s))  Urinalysis, Routine w reflex microscopic     Status: Abnormal   Collection Time: 11/12/16  3:10 PM  Result Value Ref Range   Color, Urine STRAW (A) YELLOW   APPearance  CLEAR CLEAR   Specific Gravity, Urine 1.004 (L) 1.005 - 1.030   pH 7.0 5.0 - 8.0   Glucose, UA NEGATIVE NEGATIVE mg/dL   Hgb urine dipstick NEGATIVE NEGATIVE   Bilirubin Urine NEGATIVE NEGATIVE   Ketones, ur NEGATIVE NEGATIVE mg/dL   Protein, ur NEGATIVE NEGATIVE mg/dL   Nitrite NEGATIVE NEGATIVE   Leukocytes, UA NEGATIVE NEGATIVE  Pregnancy, urine POC     Status: None   Collection Time: 11/12/16  3:37 PM  Result Value Ref Range   Preg Test, Ur NEGATIVE NEGATIVE    Comment:        THE SENSITIVITY OF THIS METHODOLOGY IS >24 mIU/mL     Review of Systems  Constitutional: Negative for fever.  Gastrointestinal: Positive for abdominal pain (Comes and goes, none now. ).  Genitourinary: Negative for vaginal bleeding and vaginal discharge.  Neurological: Positive for dizziness.  Psychiatric/Behavioral: Positive for agitation. Negative for self-injury and  suicidal ideas.   Physical Exam   Blood pressure 130/77, pulse 69, temperature 98.7 F (37.1 C), temperature source Oral, resp. rate 18, height 5' 5.5" (1.664 m), weight 178 lb (80.7 kg), SpO2 100 %, not currently breastfeeding.  Physical Exam  Constitutional: She is oriented to person, place, and time. She appears well-developed and well-nourished. No distress.  HENT:  Head: Normocephalic.  Eyes: Pupils are equal, round, and reactive to light.  GI: There is no tenderness. There is no rigidity, no rebound and no guarding.  Genitourinary:  Genitourinary Comments: Wet prep and GC collected without speculum.   Musculoskeletal: Normal range of motion.  Neurological: She is alert and oriented to person, place, and time.  Skin: Skin is warm. She is not diaphoretic.  Psychiatric: Her mood appears anxious. Her affect is blunt. She is agitated. She expresses impulsivity and inappropriate judgment. She expresses no suicidal ideation. She expresses no suicidal plans.    MAU Course  Procedures  None  MDM  She declines STI testing today: she refused speculum exam Patient has unorganized thoughts and requests. She jumps from one concern/topic to the next. Nursing supervisor to MAU to speak to the patient. Patient seems much more calm and cooperative.  Patient offered ibuprofen today and she declined, stating that nothing over the counter works.  Her pregnancy test today is negative, she refused much of the care that was offered today. Her abdominal exam is not concerning.  Patient has follow up scheduled with a counselor  in 2 weeks.   Assessment and Plan    A:  1. Feeling anxious   2. Encounter for pregnancy test with result negative   3. Lower abdominal pain     P:  Discharge home in stable condition Keep counseling appointment that is scheduled Return to MAU for OB emergencies only Follow up with GYN as needed  Duane LopeJennifer I Rasch, NP 11/14/2016 11:28 AM

## 2016-11-12 NOTE — Progress Notes (Signed)
Pt reports sharp intermittent pain in lower abdomen that changes sides.  Also concerned about an area of swelling on her labia that started after shaving her pubic hair.

## 2016-11-12 NOTE — MAU Note (Signed)
Patient states she has appointment with therapist in 2 weeks for medication for depression/panic attacks but she said with todays panic attacks she just can not wait.

## 2016-11-12 NOTE — Progress Notes (Signed)
Pt insisted on leaving MAU immediately and would not wait for RN to collect another set of vital signs.

## 2017-01-01 ENCOUNTER — Ambulatory Visit (HOSPITAL_COMMUNITY)
Admission: EM | Admit: 2017-01-01 | Discharge: 2017-01-01 | Disposition: A | Discharge status: Home / Self Care | Payer: Medicaid Other | Attending: Internal Medicine | Admitting: Internal Medicine

## 2017-01-01 ENCOUNTER — Encounter (HOSPITAL_COMMUNITY): Payer: Self-pay | Admitting: Family Medicine

## 2017-01-01 DIAGNOSIS — N760 Acute vaginitis: Secondary | ICD-10-CM | POA: Insufficient documentation

## 2017-01-01 DIAGNOSIS — M549 Dorsalgia, unspecified: Secondary | ICD-10-CM | POA: Insufficient documentation

## 2017-01-01 DIAGNOSIS — G8929 Other chronic pain: Secondary | ICD-10-CM | POA: Diagnosis not present

## 2017-01-01 DIAGNOSIS — N898 Other specified noninflammatory disorders of vagina: Secondary | ICD-10-CM | POA: Diagnosis not present

## 2017-01-01 DIAGNOSIS — Z87891 Personal history of nicotine dependence: Secondary | ICD-10-CM | POA: Diagnosis not present

## 2017-01-01 DIAGNOSIS — R110 Nausea: Secondary | ICD-10-CM | POA: Diagnosis not present

## 2017-01-01 DIAGNOSIS — F419 Anxiety disorder, unspecified: Secondary | ICD-10-CM | POA: Diagnosis not present

## 2017-01-01 DIAGNOSIS — F329 Major depressive disorder, single episode, unspecified: Secondary | ICD-10-CM | POA: Insufficient documentation

## 2017-01-01 LAB — POCT PREGNANCY, URINE: Preg Test, Ur: NEGATIVE

## 2017-01-01 MED ORDER — AZITHROMYCIN 250 MG PO TABS
1000.0000 mg | ORAL_TABLET | Freq: Once | ORAL | Status: AC
  Administered 2017-01-01: 1000 mg via ORAL

## 2017-01-01 MED ORDER — ONDANSETRON 4 MG PO TBDP: 4.0000 mg | ORAL_TABLET | Freq: Three times a day (TID) | ORAL | 0 refills | Status: DC | PRN

## 2017-01-01 MED ORDER — METRONIDAZOLE 500 MG PO TABS: 500.0000 mg | ORAL_TABLET | Freq: Two times a day (BID) | ORAL | 0 refills | Status: DC

## 2017-01-01 MED ORDER — CEFTRIAXONE SODIUM 250 MG IJ SOLR
INTRAMUSCULAR | Status: AC
  Filled 2017-01-01: qty 250

## 2017-01-01 MED ORDER — AZITHROMYCIN 250 MG PO TABS
ORAL_TABLET | ORAL | Status: AC
  Filled 2017-01-01: qty 4

## 2017-01-01 MED ORDER — CEFTRIAXONE SODIUM 250 MG IJ SOLR
250.0000 mg | Freq: Once | INTRAMUSCULAR | Status: AC
  Administered 2017-01-01: 250 mg via INTRAMUSCULAR

## 2017-01-01 MED ORDER — FLUCONAZOLE 200 MG PO TABS
ORAL_TABLET | ORAL | 0 refills | Status: DC
Start: 2017-01-01 — End: 2017-02-12

## 2017-01-01 NOTE — Discharge Instructions (Signed)
For your nausea, I prescribed Zofran, take 1 tablet under the tongue every 8 hours as needed.  Your pregnancy test was negative, based on signs, symptoms, and physical exam findings, we've treated today for an infection, and I sent prescriptions to your pharmacy for Diflucan, and metronidazole. The samples taken and sent to the lab for testing, and you'll be notified of these results in 3-5 business days. I have attached information for community [facility] wellness, if your nausea persists, I recommend you call them to establish for primary care.

## 2017-01-01 NOTE — ED Triage Notes (Signed)
Pt here for nausea for a few weeks and vaginal discharge. sts that it appears like BV.

## 2017-01-01 NOTE — ED Provider Notes (Signed)
CSN: [id number]     Arrival date & time 01/01/17  1311 History   None    Chief Complaint  Patient presents with  . Vaginal Discharge  . Nausea   (Consider location/radiation/quality/duration/timing/severity/associated sxs/prior Treatment) 21 year old female presents to clinic with a chief complaint of 3 weeks of nausea, worse in the morning, triggered by the presence, or smell of food. States that the nausea generally improves over the course of the day, and that some afternoon she is able to eat. She has been able drink fluids, keep things like Sprite, ginger ale, etc. down. She does not think she is pregnant, stating she gets Depo shots, however she was one month late on having an injection. She is sexually active, with 1 partner, does not use condoms.  She describes her discharge as being thick, white, and "smelly" been present for approximately one week, she denies any pain with intercourse, but does have some abdominal pain, she denies any back pain, fever, chills, swollen lymph nodes, or other signs or symptoms of disease.   The history is provided by the patient.  Vaginal Discharge  Associated symptoms: abdominal pain, nausea and vomiting   Associated symptoms: no fever     Past Medical History:  Diagnosis Date  . Anemia   . Anxiety   . Chronic back pain   . Depression   . Infection    UTI  . Post partum depression   . Pyelonephritis   . UTI (lower urinary tract infection)    Past Surgical History:  Procedure Laterality Date  . DILATION AND CURETTAGE OF UTERUS    . INDUCED ABORTION    . WISDOM TOOTH EXTRACTION     Family History  Problem Relation Age of Onset  . Arthritis Mother   . Arthritis Maternal Grandmother   . Asthma Son   . Alcohol abuse Neg Hx    Social History  Substance Use Topics  . Smoking status: Former Smoker    Quit date: 01/21/2014  . Smokeless tobacco: Never Used  . Alcohol use No   OB History    Gravida Para Term Preterm AB Living   5 3 3    2 3    SAB TAB Ectopic Multiple Live Births   1 1   0 3     Review of Systems  Constitutional: Positive for appetite change. Negative for chills, fatigue and fever.  Respiratory: Negative.   Cardiovascular: Negative.   Gastrointestinal: Positive for abdominal pain, nausea and vomiting. Negative for constipation and diarrhea.  Genitourinary: Positive for flank pain and vaginal discharge. Negative for difficulty urinating, menstrual problem, vaginal bleeding and vaginal pain.  Musculoskeletal: Negative for neck pain and neck stiffness.  Skin: Negative.   Neurological: Negative.     Allergies  Patient has no known allergies.  Home Medications   Prior to Admission medications   Medication Sig Start Date End Date Taking? Authorizing Provider  baclofen (LIORESAL) 10 MG tablet Take 1 tablet (10 mg total) by mouth 3 (three) times daily as needed for muscle spasms. 10/31/16   Tyrone NineGrunz, Ryan B, MD  cetirizine-pseudoephedrine (ZYRTEC-D) 5-120 MG tablet Take 1 tablet by mouth daily. 10/30/16   Eustace MooreMurray, Laura W, MD  fluconazole (DIFLUCAN) 200 MG tablet Take one tablet today, wait 3 days, take the second tablet 01/01/17   Dorena BodoKennard, Kainat Pizana, NP  ipratropium (ATROVENT) 0.03 % nasal spray Place 2 sprays into both nostrils 3 (three) times daily as needed for rhinitis. 10/31/16   Tyrone NineGrunz, Ryan B, MD  medroxyPROGESTERone (DEPO-PROVERA) 150 MG/ML injection Inject 150 mg into the muscle every 3 (three) months. DID NOT OBTAIN December INJECTION    [provider]  metroNIDAZOLE (FLAGYL) 500 MG tablet Take 1 tablet (500 mg total) by mouth 2 (two) times daily. 01/01/17   Dorena Bodo, NP  ondansetron (ZOFRAN ODT) 4 MG disintegrating tablet Take 1 tablet (4 mg total) by mouth every 8 (eight) hours as needed for nausea or vomiting. 01/01/17   Dorena Bodo, NP   Meds Ordered and Administered this Visit   Medications  cefTRIAXone (ROCEPHIN) injection 250 mg (250 mg Intramuscular Given 01/01/17 1517)   azithromycin (ZITHROMAX) tablet 1,000 mg (1,000 mg Oral Given 01/01/17 1517)    BP 116/65   Pulse 65   Temp 98.4 F (36.9 C)   Resp 18   SpO2 100%  No data found.   Physical Exam  Constitutional: She is oriented to person, place, and time. She appears well-developed and well-nourished. No distress.  HENT:  Head: Normocephalic and atraumatic.  Right Ear: External ear normal.  Left Ear: External ear normal.  Eyes: Conjunctivae are normal.  Neck: Normal range of motion.  Abdominal: Soft. Bowel sounds are normal. There is no tenderness.  Genitourinary: Vagina normal and uterus normal. Pelvic exam was performed with patient supine. There is no tenderness on the right labia. There is no tenderness on the left labia. Cervix exhibits discharge. Cervix exhibits no motion tenderness and no friability. Right adnexum displays no tenderness. Left adnexum displays no tenderness. No vaginal discharge found.  Lymphadenopathy:       Right: No inguinal adenopathy present.       Left: No inguinal adenopathy present.  Neurological: She is alert and oriented to person, place, and time.  Skin: Skin is warm and dry. Capillary refill takes less than 2 seconds. No rash noted. She is not diaphoretic. No erythema.  Psychiatric: She has a normal mood and affect. Her behavior is normal.  Nursing note and vitals reviewed.   Urgent Care Course     Procedures (including critical care time)  Labs Review Labs Reviewed  POCT PREGNANCY, URINE  CERVICOVAGINAL ANCILLARY ONLY    Imaging Review No results found.      MDM   1. Nausea   2. Vaginosis    With regard to nausea, patient was pregnancy test negative, physical exam findings unremarkable, given Zofran, recommend following up with community health and wellness to establish for primary care.  Regard to vaginosis, signs and symptoms consistent with either yeast, or BV, treating empirically with metronidazole and Diflucan, also treating  empirically with azithromycin, and Rocephin. Cytology samples obtained, we'll notify patient results in 3-5 business days. Provided counseling on follow-up guidelines, recommend safe sex practices, return to clinic as needed.     Dorena Bodo, NP 01/01/17 1538

## 2017-01-02 LAB — POCT URINALYSIS DIP (DEVICE)
Bilirubin Urine: NEGATIVE
Glucose, UA: NEGATIVE mg/dL
Hgb urine dipstick: NEGATIVE
Ketones, ur: NEGATIVE mg/dL
Leukocytes, UA: NEGATIVE
Nitrite: NEGATIVE
Protein, ur: 30 mg/dL — AB
Specific Gravity, Urine: 1.02 (ref 1.005–1.030)
Urobilinogen, UA: 0.2 mg/dL (ref 0.0–1.0)
pH: 8.5 — ABNORMAL HIGH (ref 5.0–8.0)

## 2017-01-02 LAB — CERVICOVAGINAL ANCILLARY ONLY
Bacterial vaginitis: POSITIVE — AB
Candida vaginitis: NEGATIVE
Chlamydia: NEGATIVE
Neisseria Gonorrhea: NEGATIVE
Trichomonas: NEGATIVE

## 2017-02-08 ENCOUNTER — Ambulatory Visit (HOSPITAL_COMMUNITY)
Admission: EM | Admit: 2017-02-08 | Discharge: 2017-02-08 | Disposition: A | Discharge status: Home / Self Care | Payer: Medicaid Other | Attending: Family Medicine | Admitting: Family Medicine

## 2017-02-08 ENCOUNTER — Encounter (HOSPITAL_COMMUNITY): Payer: Self-pay | Admitting: Emergency Medicine

## 2017-02-08 DIAGNOSIS — R110 Nausea: Secondary | ICD-10-CM

## 2017-02-08 NOTE — ED Provider Notes (Signed)
CSN: [id number]     Arrival date & time 02/08/17  1917 History   First MD Initiated Contact with Patient 02/08/17 2005     Chief Complaint  Patient presents with  . Abdominal Pain   (Consider location/radiation/quality/duration/timing/severity/associated sxs/prior Treatment) 21 year old female brought the child in for examination. She initially checked in thinking that she wanted to be evaluated for nausea and occasional epigastric pain. When talking with her about her baby she decided that she did not wish to be seen by provider. She states she knows that she has bipolar depression and anxiety and believes that this is causing some epigastric discomfort and nausea. she plans on seeing a provider tomorrow. She states she was okay and does not need any particular health care provision tonight. Currently denying symptoms other than mild depression.       Past Medical History:  Diagnosis Date  . Anemia   . Anxiety   . Chronic back pain   . Depression   . Infection    UTI  . Post partum depression   . Pyelonephritis   . UTI (lower urinary tract infection)    Past Surgical History:  Procedure Laterality Date  . DILATION AND CURETTAGE OF UTERUS    . INDUCED ABORTION    . WISDOM TOOTH EXTRACTION     Family History  Problem Relation Age of Onset  . Arthritis Mother   . Arthritis Maternal Grandmother   . Asthma Son   . Alcohol abuse Neg Hx    Social History  Substance Use Topics  . Smoking status: Former Smoker    Quit date: 01/21/2014  . Smokeless tobacco: Never Used  . Alcohol use No   OB History    Gravida Para Term Preterm AB Living   5 3 3   2 3    SAB TAB Ectopic Multiple Live Births   1 1   0 3     Review of Systems  All other systems reviewed and are negative.   Allergies  Patient has no known allergies.  Home Medications   Prior to Admission medications   Medication Sig Start Date End Date Taking? Authorizing Provider  baclofen (LIORESAL) 10 MG tablet Take  1 tablet (10 mg total) by mouth 3 (three) times daily as needed for muscle spasms. 10/31/16   Tyrone Nine, MD  cetirizine-pseudoephedrine (ZYRTEC-D) 5-120 MG tablet Take 1 tablet by mouth daily. 10/30/16   Eustace Moore, MD  fluconazole (DIFLUCAN) 200 MG tablet Take one tablet today, wait 3 days, take the second tablet 01/01/17   Dorena Bodo, NP  ipratropium (ATROVENT) 0.03 % nasal spray Place 2 sprays into both nostrils 3 (three) times daily as needed for rhinitis. 10/31/16   Tyrone Nine, MD  medroxyPROGESTERone (DEPO-PROVERA) 150 MG/ML injection Inject 150 mg into the muscle every 3 (three) months. DID NOT OBTAIN December INJECTION    [provider]  metroNIDAZOLE (FLAGYL) 500 MG tablet Take 1 tablet (500 mg total) by mouth 2 (two) times daily. 01/01/17   Dorena Bodo, NP  ondansetron (ZOFRAN ODT) 4 MG disintegrating tablet Take 1 tablet (4 mg total) by mouth every 8 (eight) hours as needed for nausea or vomiting. 01/01/17   Dorena Bodo, NP   Meds Ordered and Administered this Visit  Medications - No data to display  BP 113/61 (BP Location: Left Arm)   Pulse (!) 53   Temp 98.3 F (36.8 C) (Oral)   Resp 18   SpO2 100%  Breastfeeding? No  No data found.   Physical Exam  Constitutional: She is oriented to person, place, and time. She appears well-developed and well-nourished. No distress.  Eyes: EOM are normal.  Neck: Normal range of motion. Neck supple.  Cardiovascular: Normal rate.   Pulmonary/Chest: Effort normal. No respiratory distress.  Musculoskeletal: She exhibits no edema.  Neurological: She is alert and oriented to person, place, and time. She exhibits normal muscle tone.  Skin: Skin is warm and dry.  Psychiatric: She has a normal mood and affect.  Nursing note and vitals reviewed.   Urgent Care Course     Procedures (including critical care time)  Labs Review Labs Reviewed - No data to display  Imaging Review No results  found.   Visual Acuity Review  Right Eye Distance:   Left Eye Distance:   Bilateral Distance:    Right Eye Near:   Left Eye Near:    Bilateral Near:         MDM   1. Nausea without vomiting        Hayden RasmussenMabe, Leonette Tischer, NP 02/08/17 2028

## 2017-02-08 NOTE — ED Triage Notes (Signed)
Here for constant abd pain onset 2 weeks associated w/nausea  Denies fevers, vomiting, diarrhea, urinary sx  A&O x4... NAD... Ambulatory

## 2017-02-12 ENCOUNTER — Encounter (HOSPITAL_COMMUNITY): Payer: Self-pay | Admitting: *Deleted

## 2017-02-12 ENCOUNTER — Inpatient Hospital Stay (HOSPITAL_COMMUNITY)
Admission: AD | Admit: 2017-02-12 | Discharge: 2017-02-13 | Disposition: A | Discharge status: Home / Self Care | Payer: Medicaid Other | Source: Ambulatory Visit | Attending: Family Medicine | Admitting: Family Medicine

## 2017-02-12 ENCOUNTER — Ambulatory Visit (HOSPITAL_COMMUNITY)
Admission: EM | Admit: 2017-02-12 | Discharge: 2017-02-12 | Disposition: A | Discharge status: Home / Self Care | Payer: Medicaid Other | Attending: Family Medicine | Admitting: Family Medicine

## 2017-02-12 ENCOUNTER — Telehealth (HOSPITAL_COMMUNITY): Payer: Self-pay | Admitting: Emergency Medicine

## 2017-02-12 DIAGNOSIS — D649 Anemia, unspecified: Secondary | ICD-10-CM | POA: Insufficient documentation

## 2017-02-12 DIAGNOSIS — M62830 Muscle spasm of back: Secondary | ICD-10-CM

## 2017-02-12 DIAGNOSIS — F419 Anxiety disorder, unspecified: Secondary | ICD-10-CM | POA: Insufficient documentation

## 2017-02-12 DIAGNOSIS — D509 Iron deficiency anemia, unspecified: Secondary | ICD-10-CM | POA: Diagnosis not present

## 2017-02-12 DIAGNOSIS — M549 Dorsalgia, unspecified: Secondary | ICD-10-CM | POA: Diagnosis not present

## 2017-02-12 DIAGNOSIS — N760 Acute vaginitis: Secondary | ICD-10-CM | POA: Diagnosis not present

## 2017-02-12 DIAGNOSIS — Z8659 Personal history of other mental and behavioral disorders: Secondary | ICD-10-CM | POA: Insufficient documentation

## 2017-02-12 DIAGNOSIS — R1010 Upper abdominal pain, unspecified: Secondary | ICD-10-CM | POA: Diagnosis not present

## 2017-02-12 DIAGNOSIS — Z825 Family history of asthma and other chronic lower respiratory diseases: Secondary | ICD-10-CM | POA: Diagnosis not present

## 2017-02-12 DIAGNOSIS — F329 Major depressive disorder, single episode, unspecified: Secondary | ICD-10-CM | POA: Insufficient documentation

## 2017-02-12 DIAGNOSIS — R110 Nausea: Secondary | ICD-10-CM | POA: Insufficient documentation

## 2017-02-12 DIAGNOSIS — R1013 Epigastric pain: Secondary | ICD-10-CM | POA: Diagnosis present

## 2017-02-12 DIAGNOSIS — G8929 Other chronic pain: Secondary | ICD-10-CM | POA: Insufficient documentation

## 2017-02-12 DIAGNOSIS — Z87440 Personal history of urinary (tract) infections: Secondary | ICD-10-CM | POA: Diagnosis not present

## 2017-02-12 DIAGNOSIS — Z3202 Encounter for pregnancy test, result negative: Secondary | ICD-10-CM | POA: Diagnosis not present

## 2017-02-12 DIAGNOSIS — Z9889 Other specified postprocedural states: Secondary | ICD-10-CM | POA: Diagnosis not present

## 2017-02-12 DIAGNOSIS — Z8261 Family history of arthritis: Secondary | ICD-10-CM | POA: Diagnosis not present

## 2017-02-12 DIAGNOSIS — Z87891 Personal history of nicotine dependence: Secondary | ICD-10-CM | POA: Insufficient documentation

## 2017-02-12 DIAGNOSIS — B9689 Other specified bacterial agents as the cause of diseases classified elsewhere: Secondary | ICD-10-CM | POA: Diagnosis not present

## 2017-02-12 DIAGNOSIS — R109 Unspecified abdominal pain: Secondary | ICD-10-CM | POA: Diagnosis present

## 2017-02-12 LAB — POCT URINALYSIS DIP (DEVICE)
Bilirubin Urine: NEGATIVE
Glucose, UA: NEGATIVE mg/dL
Ketones, ur: NEGATIVE mg/dL
Leukocytes, UA: NEGATIVE
Nitrite: NEGATIVE
Protein, ur: NEGATIVE mg/dL
Specific Gravity, Urine: 1.015 (ref 1.005–1.030)
Urobilinogen, UA: 0.2 mg/dL (ref 0.0–1.0)
pH: 7 (ref 5.0–8.0)

## 2017-02-12 LAB — WET PREP, GENITAL
Sperm: NONE SEEN
Trich, Wet Prep: NONE SEEN
Yeast Wet Prep HPF POC: NONE SEEN

## 2017-02-12 LAB — POCT PREGNANCY, URINE: Preg Test, Ur: NEGATIVE

## 2017-02-12 NOTE — ED Triage Notes (Signed)
Pt  Reports    abd  And  Back  Pain  With  Headaches         X  2    Weeks     With  Nausea         Pt  Reports  She  Vomited   This   Am

## 2017-02-12 NOTE — ED Notes (Signed)
Patient says she must go to work.  Patient spoke to dr Milus Glazierlauenstein and left.

## 2017-02-12 NOTE — MAU Note (Signed)
Pt states she was at urgent care today and states she was told to come here for "u/s and further testing." Has sharp, lower abdominal pain and back pain that started yesterday. Pt states she took oxycodone and tylenol today-last took around 5:30pm. States it helped pain. Rates 8/10. Pt denies vaginal bleeding, but did have some vaginal discharge. LMP: January. States she is on Depo-last shot was beginning of this month.

## 2017-02-12 NOTE — Telephone Encounter (Signed)
Dr Milus Glazierlauenstein directed this nurse to call patient  And notify her that labs were negative.  For further [facility].  Patient could add probiotics to her daily medication routine.    Called patient and verified identity with 2 identifiers.  Told patient the aboe.  Patient reported she had worsening of pain at work and was on her way back to this location.  Encouraged patient to go to emergency department for further evaluation.  Dr Milus Glazierlauenstein agreed.  Patient agreed

## 2017-02-12 NOTE — ED Provider Notes (Signed)
[facility]    CSN: [id number] Arrival date & time: 02/12/17  1021     History   Chief Complaint Chief Complaint  Patient presents with  . Abdominal Pain    HPI Melanie Hancock is a 21 y.o. female.   This is 21 year old woman who comes in with 10 days of epigastric pain and nausea. Initially she had some dry heaves. She takes Depo-Provera for contraception but was late for the last 2 shots and did not have a pregnancy test before she received them.  Patient describes her pain as steady with periods of increased intensity. She has not had vomiting but she has lost her appetite. When she drinks fluids such as ginger ale or water, nausea really increases.  She occasionally has some spotting, particularly after drinking alcohol. She's had no fever or diarrhea. Her bowel movements been regular.      Past Medical History:  Diagnosis Date  . Anemia   . Anxiety   . Chronic back pain   . Depression   . Infection    UTI  . Post partum depression   . Pyelonephritis   . UTI (lower urinary tract infection)     Patient Active Problem List   Diagnosis Date Noted  . Vaginal delivery 02/04/2016  . History of postpartum depression, currently pregnant 01/23/2016  . Iron deficiency anemia 01/02/2016  . 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 Anxiety 03/28/2014  . Chronic back pain 03/28/2014  . Hx pyelonephritis 2014 05/26/2013    Past Surgical History:  Procedure Laterality Date  . DILATION AND CURETTAGE OF UTERUS    . INDUCED ABORTION    . WISDOM TOOTH EXTRACTION      OB History    Gravida Para Term Preterm AB Living   5 3 3   2 3    SAB TAB Ectopic Multiple Live Births   1 1   0 3       Home Medications    Prior to Admission medications   Medication Sig Start Date End Date Taking? Authorizing Provider  ipratropium (ATROVENT) 0.03 % nasal spray Place 2 sprays into both nostrils 3 (three) times daily as needed for rhinitis. 10/31/16   Tyrone NineGrunz, Ryan B, MD    medroxyPROGESTERone (DEPO-PROVERA) 150 MG/ML injection Inject 150 mg into the muscle every 3 (three) months. DID NOT OBTAIN December INJECTION    [provider]    Family History Family History  Problem Relation Age of Onset  . Arthritis Mother   . Arthritis Maternal Grandmother   . Asthma Son   . Alcohol abuse Neg Hx     Social History Social History  Substance Use Topics  . Smoking status: Former Smoker    Quit date: 01/21/2014  . Smokeless tobacco: Never Used  . Alcohol use No     Allergies   Patient has no known allergies.   Review of Systems Review of Systems  Constitutional: Positive for appetite change.  Gastrointestinal: Positive for abdominal pain and nausea. Negative for vomiting.  Genitourinary: Positive for frequency.  All other systems reviewed and are negative.    Physical Exam Triage Vital Signs ED Triage Vitals  Enc Vitals Group     BP 02/12/17 1037 119/61     Pulse Rate 02/12/17 1037 (!) 55     Resp 02/12/17 1037 16     Temp 02/12/17 1037 98.6 F (37 C)     Temp Source 02/12/17 1037 Oral     SpO2 02/12/17 1037  99 %     Weight --      Height --      Head Circumference --      Peak Flow --      Pain Score 02/12/17 1051 10     Pain Loc --      Pain Edu? --      Excl. in GC? --    No data found.   Updated Vital Signs BP 119/61 (BP Location: Left Arm)   Pulse (!) 55   Temp 98.6 F (37 C) (Oral)   Resp 16   SpO2 99%    Physical Exam  Constitutional: She is oriented to person, place, and time. She appears well-developed and well-nourished.  HENT:  Right Ear: External ear normal.  Left Ear: External ear normal.  Nose: Nose normal.  Mouth/Throat: Oropharynx is clear and moist.  Eyes: Conjunctivae are normal. Pupils are equal, round, and reactive to light.  Neck: Normal range of motion. Neck supple.  Cardiovascular: Normal rate, regular rhythm and normal heart sounds.   Pulmonary/Chest: Effort normal and breath sounds  normal.  Abdominal: Soft.  Tender epigastrium with deep palpation.  No rebound, masses or guarding.  Musculoskeletal: Normal range of motion.  Neurological: She is alert and oriented to person, place, and time.  Skin: Skin is warm and dry.  Nursing note and vitals reviewed.  Patient abruptly said she had to leave to avoid sanctions at work.  She left the urine and said she might return later this week for further evaluation as needed.   UC Treatments / Results  Labs (all labs ordered are listed, but only abnormal results are displayed) Labs Reviewed  POCT URINALYSIS DIP (DEVICE) - Abnormal; Notable for the following:       Result Value   Hgb urine dipstick TRACE (*)    All other components within normal limits  POCT PREGNANCY, URINE  URINE CYTOLOGY ANCILLARY ONLY    EKG  EKG Interpretation None       Radiology No results found.  Procedures Procedures (including critical care time)  Medications Ordered in UC Medications - No data to display   Initial Impression / Assessment and Plan / UC Course  I have reviewed the triage vital signs and the nursing notes.  Pertinent labs & imaging results that were available during my care of the patient were reviewed by me and considered in my medical decision making (see chart for details).     Final Clinical Impressions(s) / UC Diagnoses   Final diagnoses:  Pain of upper abdomen    New Prescriptions Discharge Medication List as of 02/12/2017 11:34 AM    Patient notified of normal urine. She said she was having increasing pain while at work. She was directed to the emergency department.   Elvina Sidle, MD 02/12/17 1610

## 2017-02-13 DIAGNOSIS — M62830 Muscle spasm of back: Secondary | ICD-10-CM

## 2017-02-13 LAB — GC/CHLAMYDIA PROBE AMP (~~LOC~~) NOT AT ARMC
Chlamydia: NEGATIVE
Neisseria Gonorrhea: NEGATIVE

## 2017-02-13 LAB — URINE CYTOLOGY ANCILLARY ONLY
Chlamydia: NEGATIVE
Neisseria Gonorrhea: NEGATIVE
Trichomonas: NEGATIVE

## 2017-02-13 MED ORDER — CYCLOBENZAPRINE HCL 10 MG PO TABS: 10.0000 mg | ORAL_TABLET | Freq: Two times a day (BID) | ORAL | 0 refills | Status: DC | PRN

## 2017-02-13 MED ORDER — ONDANSETRON HCL 4 MG PO TABS: 4.0000 mg | ORAL_TABLET | Freq: Four times a day (QID) | ORAL | 0 refills | Status: DC

## 2017-02-13 MED ORDER — METRONIDAZOLE 500 MG PO TABS: 500.0000 mg | ORAL_TABLET | Freq: Two times a day (BID) | ORAL | 0 refills | Status: DC

## 2017-02-13 MED ORDER — OXYCODONE-ACETAMINOPHEN 5-325 MG PO TABS: 1.0000 | ORAL_TABLET | Freq: Four times a day (QID) | ORAL | 0 refills | Status: DC | PRN

## 2017-02-13 NOTE — MAU Provider Note (Signed)
History     CSN: [id number]  Arrival date and time: 02/12/17 2311   First Provider Initiated Contact with Patient 02/13/17 0018      Chief Complaint  Patient presents with  . Nausea  . Abdominal Pain   HPI [name] is a 21 y.o. [id number] who presents to MAU today with complaint of lower abdominal pain and diffuse back pain x 1 week. The patient denies any injury or change in activity. She states that pain comes and goes. She took Percocet today with some relief. She states pain will have her "in tears." She denies dysuria, hematuria, vaginal bleeding or fever. She has had some urgency and frequency of urination.   OB History    Gravida Para Term Preterm AB Living   5 3 3   2 3    SAB TAB Ectopic Multiple Live Births   1 1   0 3      Past Medical History:  Diagnosis Date  . Anemia   . Anxiety   . Chronic back pain   . Depression   . Infection    UTI  . Post partum depression   . Pyelonephritis   . UTI (lower urinary tract infection)     Past Surgical History:  Procedure Laterality Date  . DILATION AND CURETTAGE OF UTERUS    . INDUCED ABORTION    . WISDOM TOOTH EXTRACTION      Family History  Problem Relation Age of Onset  . Arthritis Mother   . Arthritis Maternal Grandmother   . Asthma Son   . Alcohol abuse Neg Hx     Social History  Substance Use Topics  . Smoking status: Former Smoker    Quit date: 01/21/2014  . Smokeless tobacco: Never Used  . Alcohol use No    Allergies: No Known Allergies  No prescriptions prior to admission.    Review of Systems  Constitutional: Negative for fever.  Gastrointestinal: Positive for abdominal pain and nausea. Negative for constipation, diarrhea and vomiting.  Genitourinary: Positive for frequency and urgency. Negative for dysuria, flank pain, hematuria, vaginal bleeding and vaginal discharge.  Musculoskeletal: Positive for back pain.   Physical Exam   Blood pressure 110/63, pulse 68, temperature 98.4  F (36.9 C), temperature source Oral, resp. rate 16, height 5' 5.5" (1.664 m), weight 171 lb (77.6 kg), SpO2 99 %, not currently breastfeeding.  Physical Exam  Nursing note and vitals reviewed. Constitutional: She is oriented to person, place, and time. She appears well-developed and well-nourished. No distress.  HENT:  Head: Normocephalic and atraumatic.  Cardiovascular: Normal rate.   Respiratory: Effort normal.  GI: Soft. She exhibits no distension and no mass. There is no tenderness. There is no rebound, no guarding and no CVA tenderness.  Musculoskeletal:  Very mild diffuse tenderness to palpation of the thoracic back  Neurological: She is alert and oriented to person, place, and time.  Skin: Skin is warm and dry. No erythema.  Psychiatric: She has a normal mood and affect.    Results for orders placed or performed during the hospital encounter of 02/12/17 (from the past 24 hour(s))  Wet prep, genital     Status: Abnormal   Collection Time: 02/12/17 11:38 PM  Result Value Ref Range   Yeast Wet Prep HPF POC NONE SEEN NONE SEEN   Trich, Wet Prep NONE SEEN NONE SEEN   Clue Cells Wet Prep HPF POC PRESENT (A) NONE SEEN   WBC, Wet Prep HPF POC  FEW (A) NONE SEEN   Sperm NONE SEEN     MAU Course  Procedures None  MDM Reviewed results from UC UPT - negative UA normal except for hematuria Wet prep today shows clue cells  Assessment and Plan  A: Back spasm Bacterial vaginosis  Nausea without vomiting   P: Discharge home Rx for Percocet, Flexeril, Flagyl and Zofran given  Warning signs for worsening condition discussed Patient advised to follow-up with [facility] or [facility] if symptoms worsen since this is not a GYN issue Recommendations for PCP care given, advised to schedule an appointment Patient may return to MAU as needed or if her condition were to change or worsen   Vonzella Nipple, PA-C 02/13/2017, 1:14 AM

## 2017-02-13 NOTE — Discharge Instructions (Signed)
Back Injury Prevention Back injuries can be very painful. They can also be difficult to heal. After having one back injury, you are more likely to injure your back again. It is important to learn how to avoid injuring or re-injuring your back. The following tips can help you to prevent a back injury. What should I know about physical fitness?  Exercise for 30 minutes per day on most days of the week or as told by your doctor. Make sure to: ? Do aerobic exercises, such as walking, jogging, biking, or swimming. ? Do exercises that increase balance and strength, such as tai chi and yoga. ? Do stretching exercises. This helps with flexibility. ? Try to develop strong belly (abdominal) muscles. Your belly muscles help to support your back.  Stay at a healthy weight. This helps to decrease your risk of a back injury. What should I know about my diet?  Talk with your doctor about your overall diet. Take supplements and vitamins only as told by your doctor.  Talk with your doctor about how much calcium and vitamin D you need each day. These nutrients help to prevent weakening of the bones (osteoporosis).  Include good sources of calcium in your diet, such as: ? Dairy products. ? Green leafy vegetables. ? Products that have had calcium added to them (fortified).  Include good sources of vitamin D in your diet, such as: ? Milk. ? Foods that have had vitamin D added to them. What should I know about my posture?  Sit up straight and stand up straight. Avoid leaning forward when you sit or hunching over when you stand.  Choose chairs that have good low-back (lumbar) support.  If you work at a desk, sit close to it so you do not need to lean over. Keep your chin tucked in. Keep your neck drawn back. Keep your elbows bent so your arms look like the letter "L" (right angle).  Sit high and close to the steering wheel when you drive. Add a low-back support to your car seat, if needed.  Avoid sitting  or standing in one position for very long. Take breaks to get up, stretch, and walk around at least one time every hour. Take breaks every hour if you are driving for long periods of time.  Sleep on your side with your knees slightly bent, or sleep on your back with a pillow under your knees. Do not lie on the front of your body to sleep. What should I know about lifting, twisting, and reaching? Lifting and Heavy Lifting   Avoid heavy lifting, especially lifting over and over again. If you must do heavy lifting: ? Stretch before lifting. ? Work slowly. ? Rest between lifts. ? Use a tool such as a cart or a dolly to move objects if one is available. ? Make several small trips instead of carrying one heavy load. ? Ask for help when you need it, especially when moving big objects.  Follow these steps when lifting: ? Stand with your feet shoulder-width apart. ? Get as close to the object as you can. Do not pick up a heavy object that is far from your body. ? Use handles or lifting straps if they are available. ? Bend at your knees. Squat down, but keep your heels off the floor. ? Keep your shoulders back. Keep your chin tucked in. Keep your back straight. ? Lift the object slowly while you tighten the muscles in your legs, belly, and butt. Keep the  object as close to the center of your body as possible.  Follow these steps when putting down a heavy load: ? Stand with your feet shoulder-width apart. ? Lower the object slowly while you tighten the muscles in your legs, belly, and butt. Keep the object as close to the center of your body as possible. ? Keep your shoulders back. Keep your chin tucked in. Keep your back straight. ? Bend at your knees. Squat down, but keep your heels off the floor. ? Use handles or lifting straps if they are available. Twisting and Reaching  Avoid lifting heavy objects above your waist.  Do not twist at your waist while you are lifting or carrying a load. If  you need to turn, move your feet.  Do not bend over without bending at your knees.  Avoid reaching over your head, across a table, or for an object on a high surface. What are some other tips?  Avoid wet floors and icy ground. Keep sidewalks clear of ice to prevent falls.  Do not sleep on a mattress that is too soft or too hard.  Keep items that you use often within easy reach.  Put heavier objects on shelves at waist level, and put lighter objects on lower or higher shelves.  Find ways to lower your stress, such as: ? Exercise. ? Massage. ? Relaxation techniques.  Talk with your doctor if you feel anxious or depressed. These conditions can make back pain worse.  Wear flat heel shoes with cushioned soles.  Avoid making quick (sudden) movements.  Use both shoulder straps when carrying a backpack.  Do not use any tobacco products, including cigarettes, chewing tobacco, or electronic cigarettes. If you need help quitting, ask your doctor. This information is not intended to replace advice given to you by your health care provider. Make sure you discuss any questions you have with your health care provider. Document Released: 01/23/2008 Document Revised: 01/12/2016 Document Reviewed: 08/10/2014 Elsevier Interactive Patient Education  2018 [employer]  Bacterial Vaginosis Bacterial vaginosis is an infection of the vagina. It happens when too many germs (bacteria) grow in the vagina. This infection puts you at risk for infections from sex (STIs). Treating this infection can lower your risk for some STIs. You should also treat this if you are pregnant. It can cause your baby to be born early. Follow these instructions at home: Medicines  Take over-the-counter and prescription medicines only as told by your doctor.  Take or use your antibiotic medicine as told by your doctor. Do not stop taking or using it even if you start to feel better. General instructions  If you your  sexual partner is a woman, tell her that you have this infection. She needs to get treatment if she has symptoms. If you have a female partner, he does not need to be treated.  During treatment: ? Avoid sex. ? Do not douche. ? Avoid alcohol as told. ? Avoid breastfeeding as told.  Drink enough fluid to keep your pee (urine) clear or pale yellow.  Keep your vagina and butt (rectum) clean. ? Wash the area with warm water every day. ? Wipe from front to back after you use the toilet.  Keep all follow-up visits as told by your doctor. This is important. Preventing this condition  Do not douche.  Use only warm water to wash around your vagina.  Use protection when you have sex. This includes: ? Latex condoms. ? Dental dams.  Limit how many  people you have sex with. It is best to only have sex with the same person (be monogamous).  Get tested for STIs. Have your partner get tested.  Wear underwear that is cotton or lined with cotton.  Avoid tight pants and pantyhose. This is most important in summer.  Do not use any products that have nicotine or tobacco in them. These include cigarettes and e-cigarettes. If you need help quitting, ask your doctor.  Do not use illegal drugs.  Limit how much alcohol you drink. Contact a doctor if:  Your symptoms do not get better, even after you are treated.  You have more discharge or pain when you pee (urinate).  You have a fever.  You have pain in your belly (abdomen).  You have pain with sex.  Your bleed from your vagina between periods. Summary  This infection happens when too many germs (bacteria) grow in the vagina.  Treating this condition can lower your risk for some infections from sex (STIs).  You should also treat this if you are pregnant. It can cause early (premature) birth.  Do not stop taking or using your antibiotic medicine even if you start to feel better. This information is not intended to replace advice given  to you by your health care provider. Make sure you discuss any questions you have with your health care provider. Document Released: 05/15/2008 Document Revised: 04/21/2016 Document Reviewed: 04/21/2016 Elsevier Interactive Patient Education  2017 [employer].

## 2017-02-15 LAB — URINE CYTOLOGY ANCILLARY ONLY: Candida vaginitis: NEGATIVE

## 2017-03-08 ENCOUNTER — Emergency Department (HOSPITAL_COMMUNITY)
Admission: EM | Admit: 2017-03-08 | Discharge: 2017-03-09 | Disposition: A | Discharge status: Home / Self Care | Payer: No Typology Code available for payment source | Attending: Emergency Medicine | Admitting: Emergency Medicine

## 2017-03-08 ENCOUNTER — Encounter (HOSPITAL_COMMUNITY): Payer: Self-pay | Admitting: Emergency Medicine

## 2017-03-08 ENCOUNTER — Emergency Department (HOSPITAL_COMMUNITY): Payer: No Typology Code available for payment source

## 2017-03-08 DIAGNOSIS — S39012A Strain of muscle, fascia and tendon of lower back, initial encounter: Secondary | ICD-10-CM | POA: Insufficient documentation

## 2017-03-08 DIAGNOSIS — Y999 Unspecified external cause status: Secondary | ICD-10-CM | POA: Diagnosis not present

## 2017-03-08 DIAGNOSIS — S161XXA Strain of muscle, fascia and tendon at neck level, initial encounter: Secondary | ICD-10-CM | POA: Insufficient documentation

## 2017-03-08 DIAGNOSIS — M791 Myalgia: Secondary | ICD-10-CM | POA: Diagnosis not present

## 2017-03-08 DIAGNOSIS — Y9389 Activity, other specified: Secondary | ICD-10-CM | POA: Insufficient documentation

## 2017-03-08 DIAGNOSIS — Y92410 Unspecified street and highway as the place of occurrence of the external cause: Secondary | ICD-10-CM | POA: Insufficient documentation

## 2017-03-08 DIAGNOSIS — R079 Chest pain, unspecified: Secondary | ICD-10-CM | POA: Diagnosis not present

## 2017-03-08 DIAGNOSIS — Z87891 Personal history of nicotine dependence: Secondary | ICD-10-CM | POA: Insufficient documentation

## 2017-03-08 DIAGNOSIS — S199XXA Unspecified injury of neck, initial encounter: Secondary | ICD-10-CM | POA: Diagnosis present

## 2017-03-08 LAB — PREGNANCY, URINE: Preg Test, Ur: NEGATIVE

## 2017-03-08 MED ORDER — NAPROXEN 500 MG PO TABS
500.0000 mg | ORAL_TABLET | Freq: Once | ORAL | Status: AC
  Administered 2017-03-08: 500 mg via ORAL
  Filled 2017-03-08: qty 1
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
## 2017-03-09 MED ORDER — METHOCARBAMOL 500 MG PO TABS: 500.0000 mg | ORAL_TABLET | Freq: Every evening | ORAL | 0 refills | Status: DC | PRN

## 2017-03-09 MED ORDER — NAPROXEN 500 MG PO TABS: 500.0000 mg | ORAL_TABLET | Freq: Two times a day (BID) | ORAL | 0 refills | Status: DC

## 2017-03-09 NOTE — ED Notes (Signed)
Pt ambulatory and independent at discharge.  Verbalized understanding of discharge instructions 

## 2017-03-09 NOTE — Discharge Instructions (Signed)
Take Naproxen twice a day for the next week. Take this medicine with food. °Take muscle relaxer at bedtime to help you sleep. This medicine makes you drowsy so do not take before driving or work °Use a heating pad for sore muscles - use for 20 minutes several times a day °Return for worsening symptoms ° °
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
## 2017-05-26 ENCOUNTER — Encounter (HOSPITAL_COMMUNITY): Payer: Self-pay | Admitting: *Deleted

## 2017-05-26 ENCOUNTER — Ambulatory Visit (HOSPITAL_COMMUNITY)
Admission: EM | Admit: 2017-05-26 | Discharge: 2017-05-26 | Disposition: A | Discharge status: Home / Self Care | Payer: Self-pay | Attending: Internal Medicine | Admitting: Internal Medicine

## 2017-05-26 DIAGNOSIS — Z79891 Long term (current) use of opiate analgesic: Secondary | ICD-10-CM | POA: Insufficient documentation

## 2017-05-26 DIAGNOSIS — Z87891 Personal history of nicotine dependence: Secondary | ICD-10-CM | POA: Insufficient documentation

## 2017-05-26 DIAGNOSIS — N390 Urinary tract infection, site not specified: Secondary | ICD-10-CM | POA: Insufficient documentation

## 2017-05-26 DIAGNOSIS — R358 Other polyuria: Secondary | ICD-10-CM | POA: Insufficient documentation

## 2017-05-26 DIAGNOSIS — R350 Frequency of micturition: Secondary | ICD-10-CM

## 2017-05-26 DIAGNOSIS — G8929 Other chronic pain: Secondary | ICD-10-CM | POA: Insufficient documentation

## 2017-05-26 DIAGNOSIS — Z9889 Other specified postprocedural states: Secondary | ICD-10-CM | POA: Insufficient documentation

## 2017-05-26 DIAGNOSIS — Z8261 Family history of arthritis: Secondary | ICD-10-CM | POA: Insufficient documentation

## 2017-05-26 DIAGNOSIS — F419 Anxiety disorder, unspecified: Secondary | ICD-10-CM | POA: Insufficient documentation

## 2017-05-26 DIAGNOSIS — R110 Nausea: Secondary | ICD-10-CM | POA: Insufficient documentation

## 2017-05-26 DIAGNOSIS — N760 Acute vaginitis: Secondary | ICD-10-CM

## 2017-05-26 DIAGNOSIS — Z79899 Other long term (current) drug therapy: Secondary | ICD-10-CM | POA: Insufficient documentation

## 2017-05-26 DIAGNOSIS — D509 Iron deficiency anemia, unspecified: Secondary | ICD-10-CM | POA: Insufficient documentation

## 2017-05-26 DIAGNOSIS — Z825 Family history of asthma and other chronic lower respiratory diseases: Secondary | ICD-10-CM | POA: Insufficient documentation

## 2017-05-26 DIAGNOSIS — F53 Postpartum depression: Secondary | ICD-10-CM | POA: Insufficient documentation

## 2017-05-26 LAB — POCT URINALYSIS DIP (DEVICE)
Bilirubin Urine: NEGATIVE
Glucose, UA: NEGATIVE mg/dL
Ketones, ur: NEGATIVE mg/dL
Leukocytes, UA: NEGATIVE
Nitrite: NEGATIVE
Protein, ur: NEGATIVE mg/dL
Specific Gravity, Urine: 1.02 (ref 1.005–1.030)
Urobilinogen, UA: 0.2 mg/dL (ref 0.0–1.0)
pH: 7 (ref 5.0–8.0)

## 2017-05-26 MED ORDER — CLINDAMYCIN HCL 300 MG PO CAPS: 300.0000 mg | ORAL_CAPSULE | Freq: Two times a day (BID) | ORAL | 0 refills | Status: AC

## 2017-05-26 MED ORDER — METRONIDAZOLE 0.75 % VA GEL: 1.0000 | Freq: Two times a day (BID) | VAGINAL | 0 refills | Status: DC

## 2017-05-26 NOTE — Discharge Instructions (Addendum)
Urine tests for UTI and for common causes of vaginal irritation are pending.  Negative results will be visible in the MyChart app in the next few days (sign up instructions in this handout).  The urgent care will contact you if there are positive results and further treatment is needed.  Prescriptions for metronidazole vaginal gel and clindamycin by mouth sent to the pharmacy.

## 2017-05-26 NOTE — ED Triage Notes (Addendum)
Patient reports vaginal discharge and polyuria. Denies any specific abdominal pain or flank pain.

## 2017-05-26 NOTE — ED Provider Notes (Signed)
[facility]    CSN: [id number] Arrival date & time: 05/26/17  1223     History   Chief Complaint Chief Complaint  Patient presents with  . Polyuria  . Vaginal Discharge    HPI Melanie Hancock is a 21 y.o. female. She presents today with about 10 days history of vaginal discharge and urinary frequency. The urinary frequency is increasing. Mild suprapubic discomfort this morning, relieved by voiding. Occasional nausea. Not vomiting, no diarrhea. Has been with the same sexual partner for the last year or so and is not concerned about STDs. Has had several episodes of bacterial vaginosis and thinks this is what it might be.    HPI  Past Medical History:  Diagnosis Date  . Anemia   . Anxiety   . Chronic back pain   . Depression   . Infection    UTI  . Post partum depression   . Pyelonephritis   . UTI (lower urinary tract infection)     Patient Active Problem List   Diagnosis Date Noted  . Vaginal delivery 02/04/2016  . History of postpartum depression, currently pregnant 01/23/2016  . Iron deficiency anemia 01/02/2016  . 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 Anxiety 03/28/2014  . Chronic back pain 03/28/2014  . Hx pyelonephritis 2014 05/26/2013    Past Surgical History:  Procedure Laterality Date  . DILATION AND CURETTAGE OF UTERUS    . INDUCED ABORTION    . WISDOM TOOTH EXTRACTION      OB History    Gravida Para Term Preterm AB Living   SAB TAB Ectopic Multiple Live Births   1 1   0 3       Home Medications    Prior to Admission medications   Medication Sig Start Date End Date Taking? Authorizing Provider  medroxyPROGESTERone (DEPO-PROVERA) 150 MG/ML injection Inject 150 mg into the muscle every 3 (three) months. DID NOT OBTAIN December INJECTION   Yes [provider]  clindamycin (CLEOCIN) 300 MG capsule Take 1 capsule (300 mg total) by mouth 2 (two) times daily. 05/26/17 06/02/17  Eustace Moore, MD  cyclobenzaprine  (FLEXERIL) 10 MG tablet Take 1 tablet (10 mg total) by mouth 2 (two) times daily as needed for muscle spasms. 02/13/17   Marny Lowenstein, PA-C  ipratropium (ATROVENT) 0.03 % nasal spray Place 2 sprays into both nostrils 3 (three) times daily as needed for rhinitis. 10/31/16   Tyrone Nine, MD  methocarbamol (ROBAXIN) 500 MG tablet Take 1 tablet (500 mg total) by mouth at bedtime and may repeat dose one time if needed. 03/09/17   Bethel Born, PA-C  metroNIDAZOLE (FLAGYL) 500 MG tablet Take 1 tablet (500 mg total) by mouth 2 (two) times daily. 02/13/17   Marny Lowenstein, PA-C  metroNIDAZOLE (METROGEL VAGINAL) 0.75 % vaginal gel Place 1 Applicatorful vaginally 2 (two) times daily. 05/26/17   Eustace Moore, MD  naproxen (NAPROSYN) 500 MG tablet Take 1 tablet (500 mg total) by mouth 2 (two) times daily. 03/09/17   Bethel Born, PA-C  ondansetron (ZOFRAN) 4 MG tablet Take 1 tablet (4 mg total) by mouth every 6 (six) hours. 02/13/17   Marny Lowenstein, PA-C  oxyCODONE-acetaminophen (PERCOCET/ROXICET) 5-325 MG tablet Take 1-2 tablets by mouth every 6 (six) hours as needed for severe pain. 02/13/17   Marny Lowenstein, PA-C    Family History Family History  Problem Relation Age of  Onset  . Arthritis Mother   . Arthritis Maternal Grandmother   . Asthma Son   . Alcohol abuse Neg Hx     Social History Social History  Substance Use Topics  . Smoking status: Former Smoker    Quit date: 01/21/2014  . Smokeless tobacco: Never Used  . Alcohol use No     Allergies   Patient has no known allergies.   Review of Systems Review of Systems  All other systems reviewed and are negative.    Physical Exam Triage Vital Signs ED Triage Vitals [05/26/17 1254]  Enc Vitals Group     BP 109/69     Pulse Rate 74     Resp 17     Temp 98.7 F (37.1 C)     Temp Source Oral     SpO2 100 %     Weight      Height      Pain Score    Updated Vital Signs BP 109/69 (BP Location: Left Arm)   Pulse 74    Temp 98.7 F (37.1 C) (Oral)   Resp 17   SpO2 100%   Physical Exam  Constitutional: She is oriented to person, place, and time. No distress.  HENT:  Head: Atraumatic.  Eyes:  Conjugate gaze observed, no eye redness/discharge  Neck: Neck supple.  Cardiovascular: Normal rate.   Pulmonary/Chest: No respiratory distress.  Abdominal: She exhibits no distension.  Musculoskeletal: Normal range of motion.  Neurological: She is alert and oriented to person, place, and time.  Skin: Skin is warm and dry.  Nursing note and vitals reviewed.    UC Treatments / Results  Labs Results for orders placed or performed during the hospital encounter of 05/26/17  Urine culture  Result Value Ref Range   Specimen Description URINE, RANDOM    Special Requests NONE    Culture <10,000 COLONIES/mL INSIGNIFICANT GROWTH (A)    Report Status 05/27/2017 FINAL   POCT urinalysis dip (device)  Result Value Ref Range   Glucose, UA NEGATIVE NEGATIVE mg/dL   Bilirubin Urine NEGATIVE NEGATIVE   Ketones, ur NEGATIVE NEGATIVE mg/dL   Specific Gravity, Urine 1.020 1.005 - 1.030   Hgb urine dipstick TRACE (A) NEGATIVE   pH 7.0 5.0 - 8.0   Protein, ur NEGATIVE NEGATIVE mg/dL   Urobilinogen, UA 0.2 0.0 - 1.0 mg/dL   Nitrite NEGATIVE NEGATIVE   Leukocytes, UA NEGATIVE NEGATIVE    Procedures Procedures (including critical care time) None today  Final Clinical Impressions(s) / UC Diagnoses   Final diagnoses:  Vaginitis and vulvovaginitis  Urinary frequency   Urine tests for UTI and for common causes of vaginal irritation are pending.  Negative results will be visible in the MyChart app in the next few days (sign up instructions in this handout).  The urgent care will contact you if there are positive results and further treatment is needed.  Prescriptions for metronidazole vaginal gel and clindamycin by mouth sent to the pharmacy.    New Prescriptions New Prescriptions   CLINDAMYCIN (CLEOCIN) 300 MG  CAPSULE    Take 1 capsule (300 mg total) by mouth 2 (two) times daily.   METRONIDAZOLE (METROGEL VAGINAL) 0.75 % VAGINAL GEL    Place 1 Applicatorful vaginally 2 (two) times daily.     Controlled Substance Prescriptions [place] Controlled Substance Registry consulted? No   Eustace Moore, MD 05/27/17 1224

## 2017-05-27 LAB — URINE CYTOLOGY ANCILLARY ONLY
Chlamydia: NEGATIVE
Neisseria Gonorrhea: NEGATIVE
Trichomonas: NEGATIVE

## 2017-05-27 LAB — URINE CULTURE: Culture: <10000 — AB

## 2017-05-29 LAB — URINE CYTOLOGY ANCILLARY ONLY: Candida vaginitis: NEGATIVE

## 2017-06-15 ENCOUNTER — Ambulatory Visit (HOSPITAL_COMMUNITY)
Admission: EM | Admit: 2017-06-15 | Discharge: 2017-06-15 | Disposition: A | Discharge status: Home / Self Care | Payer: Self-pay | Attending: Family Medicine | Admitting: Family Medicine

## 2017-06-15 ENCOUNTER — Encounter (HOSPITAL_COMMUNITY): Payer: Self-pay | Admitting: Emergency Medicine

## 2017-06-15 DIAGNOSIS — Z87891 Personal history of nicotine dependence: Secondary | ICD-10-CM | POA: Insufficient documentation

## 2017-06-15 DIAGNOSIS — G8929 Other chronic pain: Secondary | ICD-10-CM | POA: Insufficient documentation

## 2017-06-15 DIAGNOSIS — F419 Anxiety disorder, unspecified: Secondary | ICD-10-CM | POA: Insufficient documentation

## 2017-06-15 DIAGNOSIS — N3090 Cystitis, unspecified without hematuria: Secondary | ICD-10-CM

## 2017-06-15 DIAGNOSIS — Z79899 Other long term (current) drug therapy: Secondary | ICD-10-CM | POA: Insufficient documentation

## 2017-06-15 DIAGNOSIS — M549 Dorsalgia, unspecified: Secondary | ICD-10-CM | POA: Insufficient documentation

## 2017-06-15 LAB — POCT PREGNANCY, URINE: Preg Test, Ur: NEGATIVE

## 2017-06-15 LAB — POCT URINALYSIS DIP (DEVICE)
Bilirubin Urine: NEGATIVE
Glucose, UA: NEGATIVE mg/dL
Ketones, ur: NEGATIVE mg/dL
Nitrite: NEGATIVE
Protein, ur: NEGATIVE mg/dL
Specific Gravity, Urine: 1.02 (ref 1.005–1.030)
Urobilinogen, UA: 0.2 mg/dL (ref 0.0–1.0)
pH: 6 (ref 5.0–8.0)

## 2017-06-15 MED ORDER — CEPHALEXIN 500 MG PO CAPS
500.0000 mg | ORAL_CAPSULE | Freq: Two times a day (BID) | ORAL | 0 refills | Status: DC
Start: 2017-06-15 — End: 2017-08-09

## 2017-06-15 NOTE — Discharge Instructions (Signed)
Your urine was positive for an urinary tract infection. Start Keflex as directed. Keep hydrated, your urine should be clear to pale yellow in color. Monitor for any worsening of symptoms, fever, worsening abdominal pain, nausea/vomiting, flank pain, follow up for reevaluation.  °

## 2017-06-15 NOTE — ED Triage Notes (Signed)
Pt here for UTI sx onset 6 days associated w/abd pressure, urinary freq  Denies fevers, dysuria, hematuria  A&O x4... NAD... Ambulatory

## 2017-06-15 NOTE — ED Provider Notes (Signed)
[facility]    CSN: [id number] Arrival date & time: 06/15/17  1847     History   Chief Complaint Chief Complaint  Patient presents with  . Urinary Tract Infection    HPI Melanie Hancock is a 21 y.o. female.   21 year old female with history of anemia, anxiety, depression comes in for 6 day history of abdominal pressure, urinary frequency. She has had some nausea without vomiting. Denies dysuria, hematuria. Denies fever, chills, night sweats. She was here 3 weeks ago for vaginal symptoms, was positive for BV and was treated for it. States symptoms has since resolved, denies residual vaginal discharge, vaginal itching. Sexually active with 1 partner, no condom use. She gets depot injections and does not have cycles, last injection 03/2017       Past Medical History:  Diagnosis Date  . Anemia   . Anxiety   . Chronic back pain   . Depression   . Infection    UTI  . Post partum depression   . Pyelonephritis   . UTI (lower urinary tract infection)     Patient Active Problem List   Diagnosis Date Noted  . Vaginal delivery 02/04/2016  . History of postpartum depression, currently pregnant 01/23/2016  . Iron deficiency anemia 01/02/2016  . 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 Anxiety 03/28/2014  . Chronic back pain 03/28/2014  . Hx pyelonephritis 2014 05/26/2013    Past Surgical History:  Procedure Laterality Date  . DILATION AND CURETTAGE OF UTERUS    . INDUCED ABORTION    . WISDOM TOOTH EXTRACTION      OB History    Gravida Para Term Preterm AB Living   5 3 3   2 3    SAB TAB Ectopic Multiple Live Births   1 1   0 3       Home Medications    Prior to Admission medications   Medication Sig Start Date End Date Taking? Authorizing Provider  medroxyPROGESTERone (DEPO-PROVERA) 150 MG/ML injection Inject 150 mg into the muscle every 3 (three) months. DID NOT OBTAIN December INJECTION   Yes [provider]  cephALEXin (KEFLEX) 500 MG capsule Take 1  capsule (500 mg total) by mouth 2 (two) times daily. 06/15/17   Cathie Hoops, [place] V, PA-C  ipratropium (ATROVENT) 0.03 % nasal spray Place 2 sprays into both nostrils 3 (three) times daily as needed for rhinitis. 10/31/16   Tyrone Nine, MD    Family History Family History  Problem Relation Age of Onset  . Arthritis Mother   . Arthritis Maternal Grandmother   . Asthma Son   . Alcohol abuse Neg Hx     Social History Social History  Substance Use Topics  . Smoking status: Former Smoker    Quit date: 01/21/2014  . Smokeless tobacco: Never Used  . Alcohol use No     Allergies   Patient has no known allergies.   Review of Systems Review of Systems  Reason unable to perform ROS: See HPI as above.     Physical Exam Triage Vital Signs ED Triage Vitals  Enc Vitals Group     BP 06/15/17 1919 (!) 107/58     Pulse Rate 06/15/17 1919 74     Resp 06/15/17 1919 20     Temp 06/15/17 1919 99.2 F (37.3 C)     Temp Source 06/15/17 1919 Oral     SpO2 06/15/17 1919 99 %     Weight --  Height --      Head Circumference --      Peak Flow --      Pain Score 06/15/17 1920 6     Pain Loc --      Pain Edu? --      Excl. in GC? --    No data found.   Updated Vital Signs BP (!) 107/58 (BP Location: Left Arm)   Pulse 74   Temp 99.2 F (37.3 C) (Oral)   Resp 20   SpO2 99%   Breastfeeding? No   Physical Exam  Constitutional: She is oriented to person, place, and time. She appears well-developed and well-nourished. No distress.  Eyes: Pupils are equal, round, and reactive to light. Conjunctivae are normal.  Cardiovascular: Normal rate, regular rhythm and normal heart sounds.  Exam reveals no gallop and no friction rub.   No murmur heard. Pulmonary/Chest: Effort normal and breath sounds normal. She has no wheezes. She has no rales.  Abdominal: Soft. Bowel sounds are normal. She exhibits no distension. There is no tenderness. There is no rebound, no guarding and no CVA tenderness.    Neurological: She is alert and oriented to person, place, and time.  Skin: Skin is warm and dry.     UC Treatments / Results  Labs (all labs ordered are listed, but only abnormal results are displayed) Labs Reviewed  POCT URINALYSIS DIP (DEVICE) - Abnormal; Notable for the following:       Result Value   Hgb urine dipstick SMALL (*)    Leukocytes, UA LARGE (*)    All other components within normal limits  URINE CULTURE  POCT PREGNANCY, URINE    EKG  EKG Interpretation None       Radiology No results found.  Procedures Procedures (including critical care time)  Medications Ordered in UC Medications - No data to display   Initial Impression / Assessment and Plan / UC Course  I have reviewed the triage vital signs and the nursing notes.  Pertinent labs & imaging results that were available during my care of the patient were reviewed by me and considered in my medical decision making (see chart for details).    Urine dipstick positive for UTI. Start antibiotics as directed. Urine culture sent. Push fluids. Return precautions given.  Final Clinical Impressions(s) / UC Diagnoses   Final diagnoses:  Cystitis    New Prescriptions Discharge Medication List as of 06/15/2017  7:39 PM    START taking these medications   Details  cephALEXin (KEFLEX) 500 MG capsule Take 1 capsule (500 mg total) by mouth 2 (two) times daily., Starting Sat 06/15/2017, Normal          Belinda FisherYu, Shlomo Seres V, [place]PA-C 06/15/17 1948

## 2017-06-17 LAB — URINE CULTURE: Culture: >=100000 — AB

## 2017-06-20 ENCOUNTER — Telehealth (HOSPITAL_COMMUNITY): Payer: Self-pay | Admitting: Emergency Medicine

## 2017-06-20 MED ORDER — NITROFURANTOIN MONOHYD MACRO 100 MG PO CAPS: 100.0000 mg | ORAL_CAPSULE | Freq: Two times a day (BID) | ORAL | 0 refills | Status: DC

## 2017-06-20 NOTE — Telephone Encounter (Signed)
Pt called stating she only had 7 total pills for keflex when she was told she was supposed to take it for seven days. Pt only took this medicine for three days and states she felt like she was getting better but then she still feels like she has a UTI. Spoke with Dr. Chaney MallingMortenson who reviewed urine culture and ordered Macrobid 100mg  twice a day for 5 days. Prescription sent to [facility] on [address]. Pt agreeable to plan.

## 2017-06-20 NOTE — ED Provider Notes (Signed)
Patient called, she is still having UTI symptoms.  She was given only 7 pills of Keflex on her initial visit, and so took 1 day of Keflex once daily and 3 days of Keflex 500 mg twice daily.  Has not taken any in 2 days.  Culture reviewed.  She has an E. coli UTI that is also sensitive to Macrobid.  Concern for partially treated UTI so we will switch antibiotics.  Will have nurse call in 100 mg of Macrobid 2 times a day for 5 days.  Patient may return here if not getting better, to the ER if she gets worse.  Domenick GongAshley Chauncey Sciulli, MD.   Domenick GongMortenson, Ford Peddie, MD 06/20/17 [phone]

## 2017-08-09 ENCOUNTER — Ambulatory Visit (INDEPENDENT_AMBULATORY_CARE_PROVIDER_SITE_OTHER): Payer: Self-pay

## 2017-08-09 ENCOUNTER — Other Ambulatory Visit: Payer: Self-pay

## 2017-08-09 ENCOUNTER — Encounter (HOSPITAL_COMMUNITY): Payer: Self-pay | Admitting: Emergency Medicine

## 2017-08-09 ENCOUNTER — Ambulatory Visit (HOSPITAL_COMMUNITY)
Admission: EM | Admit: 2017-08-09 | Discharge: 2017-08-09 | Disposition: A | Discharge status: Home / Self Care | Payer: Self-pay | Attending: Internal Medicine | Admitting: Internal Medicine

## 2017-08-09 DIAGNOSIS — R0789 Other chest pain: Secondary | ICD-10-CM

## 2017-08-09 DIAGNOSIS — R1013 Epigastric pain: Secondary | ICD-10-CM | POA: Insufficient documentation

## 2017-08-09 DIAGNOSIS — R109 Unspecified abdominal pain: Secondary | ICD-10-CM

## 2017-08-09 DIAGNOSIS — R05 Cough: Secondary | ICD-10-CM | POA: Insufficient documentation

## 2017-08-09 DIAGNOSIS — R112 Nausea with vomiting, unspecified: Secondary | ICD-10-CM | POA: Insufficient documentation

## 2017-08-09 DIAGNOSIS — Z3202 Encounter for pregnancy test, result negative: Secondary | ICD-10-CM

## 2017-08-09 DIAGNOSIS — Z87440 Personal history of urinary (tract) infections: Secondary | ICD-10-CM | POA: Insufficient documentation

## 2017-08-09 DIAGNOSIS — Z825 Family history of asthma and other chronic lower respiratory diseases: Secondary | ICD-10-CM | POA: Insufficient documentation

## 2017-08-09 DIAGNOSIS — Z8261 Family history of arthritis: Secondary | ICD-10-CM | POA: Insufficient documentation

## 2017-08-09 DIAGNOSIS — Z87891 Personal history of nicotine dependence: Secondary | ICD-10-CM | POA: Insufficient documentation

## 2017-08-09 DIAGNOSIS — N898 Other specified noninflammatory disorders of vagina: Secondary | ICD-10-CM | POA: Insufficient documentation

## 2017-08-09 LAB — POCT URINALYSIS DIP (DEVICE)
Bilirubin Urine: NEGATIVE
Glucose, UA: NEGATIVE mg/dL
Hgb urine dipstick: NEGATIVE
Ketones, ur: NEGATIVE mg/dL
Leukocytes, UA: NEGATIVE
Nitrite: NEGATIVE
Protein, ur: NEGATIVE mg/dL
Specific Gravity, Urine: 1.02 (ref 1.005–1.030)
Urobilinogen, UA: 0.2 mg/dL (ref 0.0–1.0)
pH: 7 (ref 5.0–8.0)

## 2017-08-09 LAB — POCT PREGNANCY, URINE: Preg Test, Ur: NEGATIVE

## 2017-08-09 MED ORDER — OMEPRAZOLE 20 MG PO CPDR: 20.0000 mg | DELAYED_RELEASE_CAPSULE | Freq: Every day | ORAL | 0 refills | Status: DC

## 2017-08-09 MED ORDER — ONDANSETRON HCL 4 MG PO TABS: 4.0000 mg | ORAL_TABLET | Freq: Three times a day (TID) | ORAL | 0 refills | Status: DC | PRN

## 2017-08-09 NOTE — Discharge Instructions (Signed)
It seems likely that your chest pain and nausea may be related to gastric reflux, which very well may be exacerbated by anxiety.  Please try daily omeprazole to see if this helps with symptoms. We will notify you of any positive findings for Bv, yeast or STDS due to vaginal discharge with odor. Please establish with a primary care provider for continued monitoring and management of your symptoms. If develop increased pain, fevers, chest pain, shortness of breath, abdominal pain, persistent nausea please return to be seen or visit Ed.

## 2017-08-09 NOTE — ED Provider Notes (Signed)
[facility]    CSN: [id number] Arrival date & time: 08/09/17  1437     History   Chief Complaint Chief Complaint  Patient presents with  . Cough  . Chest Pain    HPI Melanie Hancock is a 21 y.o. female.   Melanie Hancock presents with multiple complaints. She feels she has chest pain which started today. She states she has had it since this morning, has mildly improved. States feels similar to pain she has experienced with anxiety. Without shortness of breath. No exacerbating factors. She has had congestion and cough for approximately 1.5 weeks but states that these have significantly improved and now is minimal. Has been taking mucinex. Also with left flank pain. This has been waxing and waning. Without exacerbating factors. States she is concerned of pregnancy as was due for depo last month but did not get, does not use condoms and is sexually active. Has had intermittnet nausea and vomiting. Has vomited three times in the past week. Symptoms have not worsened. without fevers. Rates pain 8/10. She smokes marijuana daily. Without recent travel. Without leg pain. LMP was a year ago due to depo. Has not taken any medications for her symptoms. Has noticed mild increase in urination and odor to vaginal discharge.   ROS per HPI.       Past Medical History:  Diagnosis Date  . Anemia   . Anxiety   . Chronic back pain   . Depression   . Infection    UTI  . Post partum depression   . Pyelonephritis   . UTI (lower urinary tract infection)     Patient Active Problem List   Diagnosis Date Noted  . Vaginal delivery 02/04/2016  . History of postpartum depression, currently pregnant 01/23/2016  . Iron deficiency anemia 01/02/2016  . 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 Anxiety 03/28/2014  . Chronic back pain 03/28/2014  . Hx pyelonephritis 2014 05/26/2013    Past Surgical History:  Procedure Laterality Date  . DILATION AND CURETTAGE OF UTERUS    . INDUCED ABORTION    . WISDOM  TOOTH EXTRACTION      OB History    Gravida Para Term Preterm AB Living   5 3 3   2 3    SAB TAB Ectopic Multiple Live Births   1 1   0 3       Home Medications    Prior to Admission medications   Medication Sig Start Date End Date Taking? Authorizing Provider  ipratropium (ATROVENT) 0.03 % nasal spray Place 2 sprays into both nostrils 3 (three) times daily as needed for rhinitis. 10/31/16   Tyrone Nine, MD  medroxyPROGESTERone (DEPO-PROVERA) 150 MG/ML injection Inject 150 mg into the muscle every 3 (three) months. DID NOT OBTAIN December INJECTION    [provider]  nitrofurantoin, macrocrystal-monohydrate, (MACROBID) 100 MG capsule Take 1 capsule (100 mg total) by mouth 2 (two) times daily. 06/20/17   Domenick Gong, MD  omeprazole (PRILOSEC) 20 MG capsule Take 1 capsule (20 mg total) by mouth daily. 08/09/17   Georgetta Haber, NP  ondansetron (ZOFRAN) 4 MG tablet Take 1 tablet (4 mg total) by mouth every 8 (eight) hours as needed for nausea or vomiting. 08/09/17   Georgetta Haber, NP    Family History Family History  Problem Relation Age of Onset  . Arthritis Mother   . Arthritis Maternal Grandmother   . Asthma Son   . Alcohol abuse Neg Hx  Social History Social History   Tobacco Use  . Smoking status: Former Smoker    Last attempt to quit: 01/21/2014    Years since quitting: 3.5  . Smokeless tobacco: Never Used  Substance Use Topics  . Alcohol use: No  . Drug use: No     Allergies   Patient has no known allergies.   Review of Systems Review of Systems   Physical Exam Triage Vital Signs ED Triage Vitals  Enc Vitals Group     BP 08/09/17 1446 121/65     Pulse Rate 08/09/17 1446 75     Resp 08/09/17 1446 16     Temp 08/09/17 1446 98.2 F (36.8 C)     Temp Source 08/09/17 1446 Oral     SpO2 08/09/17 1446 100 %     Weight --      Height --      Head Circumference --      Peak Flow --      Pain Score 08/09/17 1448 9     Pain Loc --        Pain Edu? --      Excl. in GC? --    No data found.  Updated Vital Signs BP 121/65   Pulse 75   Temp 98.2 F (36.8 C) (Oral)   Resp 16   SpO2 100%   Visual Acuity Right Eye Distance:   Left Eye Distance:   Bilateral Distance:    Right Eye Near:   Left Eye Near:    Bilateral Near:     Physical Exam  Constitutional: She is oriented to person, place, and time. She appears well-developed and well-nourished. No distress.  HENT:  Head: Normocephalic and atraumatic.  Right Ear: Tympanic membrane, external ear and ear canal normal.  Left Ear: Tympanic membrane, external ear and ear canal normal.  Nose: Nose normal.  Mouth/Throat: Uvula is midline, oropharynx is clear and moist and mucous membranes are normal. No tonsillar exudate.  Eyes: Conjunctivae and EOM are normal. Pupils are equal, round, and reactive to light.  Cardiovascular: Normal rate, regular rhythm and normal heart sounds.  Pulmonary/Chest: Effort normal and breath sounds normal. She has no decreased breath sounds. She exhibits no tenderness.  Abdominal: There is tenderness in the right lower quadrant, suprapubic area and left lower quadrant.    Indicates pain to left mid/upper abdomen to flank; unable to reproduce on palpation; with suprapubic palpation this increases flank pain  Neurological: She is alert and oriented to person, place, and time.  Skin: Skin is warm and dry.     UC Treatments / Results  Labs (all labs ordered are listed, but only abnormal results are displayed) Labs Reviewed  POCT URINALYSIS DIP (DEVICE)  POCT PREGNANCY, URINE  URINE CYTOLOGY ANCILLARY ONLY    EKG  EKG Interpretation None       Radiology Dg Chest 2 View  Result Date: 08/09/2017 CLINICAL DATA:  Cough, nausea and vomiting.  Smoker of marijuana. EXAM: CHEST  2 VIEW COMPARISON:  Chest x-ray dated 03/08/2017. FINDINGS: Heart size and mediastinal contours are within normal limits. Lungs are clear. Lung volumes are  normal. No pleural effusion or pneumothorax seen. Osseous structures about the chest are unremarkable. IMPRESSION: No active cardiopulmonary disease.  No evidence of pneumonia. Electronically Signed   By: Bary RichardStan  Maynard M.D.   On: 08/09/2017 16:39    Procedures Procedures (including critical care time)  Medications Ordered in UC Medications - No data to display  Initial Impression / Assessment and Plan / UC Course  I have reviewed the triage vital signs and the nursing notes.  Pertinent labs & imaging results that were available during my care of the patient were reviewed by me and considered in my medical decision making (see chart for details).    Patient with history of epigastric pain, nausea and vomiting which has been worse at work. Patient endorses anxiety. Increase of symptoms with empty stomach today. Seems consistent with GERD at this time. Non specific abdominal and flank exam at this time. Urine negative, negative for pregnancy. Urine cytology pending, will notify of positive findings. Patient endorses she has has similar in the past which resolved without treatment. Will try omeprazole daily as well as zofran as needed. Recommend establish with PCP for continued management and monitoring. Patient verbalized understanding and agreeable to plan.    Final Clinical Impressions(s) / UC Diagnoses   Final diagnoses:  Atypical chest pain  Flank pain    ED Discharge Orders        Ordered    omeprazole (PRILOSEC) 20 MG capsule  Daily     08/09/17 1646    ondansetron (ZOFRAN) 4 MG tablet  Every 8 hours PRN     08/09/17 1646       Controlled Substance Prescriptions [place] Controlled Substance Registry consulted? Not Applicable   Georgetta HaberBurky, Natalie B, NP 08/09/17 1651

## 2017-08-09 NOTE — ED Triage Notes (Signed)
Pt c/o sharp pains on her left side of her body, chest, flank area. Coughing, runny nose. Congestion.

## 2017-08-14 LAB — URINE CYTOLOGY ANCILLARY ONLY
Chlamydia: NEGATIVE
Neisseria Gonorrhea: NEGATIVE
Trichomonas: NEGATIVE

## 2017-08-15 LAB — URINE CYTOLOGY ANCILLARY ONLY: Candida vaginitis: NEGATIVE

## 2017-08-22 ENCOUNTER — Telehealth (HOSPITAL_COMMUNITY): Payer: Self-pay | Admitting: *Deleted

## 2017-09-08 ENCOUNTER — Encounter (HOSPITAL_COMMUNITY): Payer: Self-pay | Admitting: Emergency Medicine

## 2017-09-08 ENCOUNTER — Other Ambulatory Visit: Payer: Self-pay

## 2017-09-08 ENCOUNTER — Emergency Department (HOSPITAL_COMMUNITY): Payer: Self-pay

## 2017-09-08 DIAGNOSIS — R6883 Chills (without fever): Secondary | ICD-10-CM | POA: Insufficient documentation

## 2017-09-08 DIAGNOSIS — M791 Myalgia, unspecified site: Secondary | ICD-10-CM | POA: Insufficient documentation

## 2017-09-08 DIAGNOSIS — R110 Nausea: Secondary | ICD-10-CM | POA: Insufficient documentation

## 2017-09-08 DIAGNOSIS — R0789 Other chest pain: Secondary | ICD-10-CM | POA: Insufficient documentation

## 2017-09-08 DIAGNOSIS — Z87891 Personal history of nicotine dependence: Secondary | ICD-10-CM | POA: Insufficient documentation

## 2017-09-08 DIAGNOSIS — B349 Viral infection, unspecified: Secondary | ICD-10-CM | POA: Insufficient documentation

## 2017-09-08 DIAGNOSIS — R1010 Upper abdominal pain, unspecified: Secondary | ICD-10-CM | POA: Insufficient documentation

## 2017-09-08 DIAGNOSIS — R51 Headache: Secondary | ICD-10-CM | POA: Insufficient documentation

## 2017-09-08 DIAGNOSIS — Z79899 Other long term (current) drug therapy: Secondary | ICD-10-CM | POA: Insufficient documentation

## 2017-09-08 LAB — BASIC METABOLIC PANEL
Anion gap: 9 (ref 5–15)
BUN: 6 mg/dL (ref 6–20)
CO2: 22 mmol/L (ref 22–32)
Calcium: 9 mg/dL (ref 8.9–10.3)
Chloride: 108 mmol/L (ref 101–111)
Creatinine, Ser: 0.83 mg/dL (ref 0.44–1.00)
GFR calc Af Amer: >60 mL/min (ref >60)
GFR calc non Af Amer: >60 mL/min (ref >60)
Glucose, Bld: 89 mg/dL (ref 65–99)
Potassium: 3.9 mmol/L (ref 3.5–5.1)
Sodium: 139 mmol/L (ref 135–145)

## 2017-09-08 LAB — URINALYSIS, ROUTINE W REFLEX MICROSCOPIC
Bilirubin Urine: NEGATIVE
Glucose, UA: NEGATIVE mg/dL
Hgb urine dipstick: NEGATIVE
Ketones, ur: NEGATIVE mg/dL
Leukocytes, UA: NEGATIVE
Nitrite: NEGATIVE
Protein, ur: NEGATIVE mg/dL
Specific Gravity, Urine: 1.023 (ref 1.005–1.030)
pH: 6 (ref 5.0–8.0)

## 2017-09-08 LAB — CBC
HCT: 42.6 % (ref 36.0–46.0)
Hemoglobin: 14.3 g/dL (ref 12.0–15.0)
MCH: 29.5 pg (ref 26.0–34.0)
MCHC: 33.6 g/dL (ref 30.0–36.0)
MCV: 88 fL (ref 78.0–100.0)
Platelets: 248 10*3/uL (ref 150–400)
RBC: 4.84 MIL/uL (ref 3.87–5.11)
RDW: 12.7 % (ref 11.5–15.5)
WBC: 5 10*3/uL (ref 4.0–10.5)

## 2017-09-08 LAB — I-STAT TROPONIN, ED: Troponin i, poc: 0 ng/mL (ref 0.00–0.08)

## 2017-09-08 LAB — I-STAT BETA HCG BLOOD, ED (MC, WL, AP ONLY): I-stat hCG, quantitative: <5 m[IU]/mL (ref <5)

## 2017-09-08 LAB — LIPASE, BLOOD: Lipase: 39 U/L (ref 11–51)

## 2017-09-08 MED ORDER — ONDANSETRON 4 MG PO TBDP
4.0000 mg | ORAL_TABLET | Freq: Once | ORAL | Status: AC | PRN
  Administered 2017-09-08: 4 mg via ORAL
  Filled 2017-09-08: qty 1

## 2017-09-08 NOTE — ED Notes (Signed)
Pt reports abdominal pain increased after depo shot on 08/27/17, reports she's been on depo x 18 months.

## 2017-09-08 NOTE — ED Triage Notes (Signed)
Pt c/o chest pain, abdominal pain, headache, and back pain. Endorses nausea and diarrhea, denies vomiting, shortness of breath.

## 2017-09-08 NOTE — ED Notes (Signed)
Called over to help Pt b/c Pt was crying on the waiting room floor. When asked what was wrong the Pt stated she is have a lot of abdominal pain. Pt has spit up her water. Pt put in wheelchair and brought to Triage for reevaluation.

## 2017-09-09 ENCOUNTER — Emergency Department (HOSPITAL_COMMUNITY)
Admission: EM | Admit: 2017-09-09 | Discharge: 2017-09-09 | Disposition: A | Discharge status: Home / Self Care | Payer: Self-pay | Attending: Emergency Medicine | Admitting: Emergency Medicine

## 2017-09-09 DIAGNOSIS — B349 Viral infection, unspecified: Secondary | ICD-10-CM

## 2017-09-09 DIAGNOSIS — R0789 Other chest pain: Secondary | ICD-10-CM

## 2017-09-09 LAB — HEPATIC FUNCTION PANEL
ALT: 23 U/L (ref 14–54)
AST: 20 U/L (ref 15–41)
Albumin: 3.9 g/dL (ref 3.5–5.0)
Alkaline Phosphatase: 65 U/L (ref 38–126)
Bilirubin, Direct: 0.2 mg/dL (ref 0.1–0.5)
Indirect Bilirubin: 1.1 mg/dL — ABNORMAL HIGH (ref 0.3–0.9)
Total Bilirubin: 1.3 mg/dL — ABNORMAL HIGH (ref 0.3–1.2)
Total Protein: 7.4 g/dL (ref 6.5–8.1)

## 2017-09-09 LAB — RAPID STREP SCREEN (MED CTR MEBANE ONLY): Streptococcus, Group A Screen (Direct): NEGATIVE

## 2017-09-09 LAB — I-STAT TROPONIN, ED: Troponin i, poc: 0 ng/mL (ref 0.00–0.08)

## 2017-09-09 LAB — D-DIMER, QUANTITATIVE (NOT AT ARMC): D-Dimer, Quant: <0.27 ug/mL-FEU (ref 0.00–0.50)

## 2017-09-09 MED ORDER — GI COCKTAIL ~~LOC~~
30.0000 mL | Freq: Once | ORAL | Status: AC
  Administered 2017-09-09: 30 mL via ORAL
  Filled 2017-09-09: qty 30

## 2017-09-09 MED ORDER — OMEPRAZOLE 20 MG PO CPDR: 20.0000 mg | DELAYED_RELEASE_CAPSULE | Freq: Every day | ORAL | 0 refills | Status: DC

## 2017-09-09 MED ORDER — ACETAMINOPHEN 325 MG PO TABS
650.0000 mg | ORAL_TABLET | Freq: Once | ORAL | Status: AC
  Administered 2017-09-09: 650 mg via ORAL
  Filled 2017-09-09: qty 2

## 2017-09-09 NOTE — ED Notes (Signed)
Pt requested

## 2017-09-09 NOTE — ED Provider Notes (Signed)
[facility] EMERGENCY DEPARTMENT Provider Note   CSN: [id number] Arrival date & time: 09/08/17  2201     History   Chief Complaint Chief Complaint  Patient presents with  . Chest Pain  . Abdominal Pain    HPI Melanie Hancock is a 22 y.o. female.  Patient with multiple complaints.  States she has had a gradual onset frontal headache for about the past 1 week.  She is concerned she might have strep throat because her child was recently diagnosed with this.  She denies sore throat but states her throat has been scratchy for the past 2 days.  She has had chills but no documented fever.  She developed upper abdominal pain with nausea over the past 2 days but no vomiting.  Does have a history of acid reflux.  Denies any diarrhea, pain with urination or blood in the urine.  She denies any rhinorrhea or cough.  States she has a history of UTIs and anemia in the past.  Has chronic back pain at baseline.  Did not receive a flu shot.  Has had poor appetite with body aches, chills and headache.  She described episode of chest pain earlier that lasted for just a few seconds when her abdomen was hurting but this is resolved.   The history is provided by the patient.  Chest Pain   Associated symptoms include abdominal pain, headaches and nausea. Pertinent negatives include no cough, no dizziness, no fever, no shortness of breath and no weakness.  Abdominal Pain   Associated symptoms include nausea, headaches, arthralgias and myalgias. Pertinent negatives include fever, dysuria and hematuria.    Past Medical History:  Diagnosis Date  . Anemia   . Anxiety   . Chronic back pain   . Depression   . Infection    UTI  . Post partum depression   . Pyelonephritis   . UTI (lower urinary tract infection)     Patient Active Problem List   Diagnosis Date Noted  . Vaginal delivery 02/04/2016  . History of postpartum depression, currently pregnant 01/23/2016  . Iron deficiency  anemia 01/02/2016  . 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 Anxiety 03/28/2014  . Chronic back pain 03/28/2014  . Hx pyelonephritis 2014 05/26/2013    Past Surgical History:  Procedure Laterality Date  . DILATION AND CURETTAGE OF UTERUS    . INDUCED ABORTION    . WISDOM TOOTH EXTRACTION      OB History    Gravida Para Term Preterm AB Living   5 3 3   2 3    SAB TAB Ectopic Multiple Live Births   1 1   0 3       Home Medications    Prior to Admission medications   Medication Sig Start Date End Date Taking? Authorizing Provider  ipratropium (ATROVENT) 0.03 % nasal spray Place 2 sprays into both nostrils 3 (three) times daily as needed for rhinitis. 10/31/16   Tyrone Nine, MD  medroxyPROGESTERone (DEPO-PROVERA) 150 MG/ML injection Inject 150 mg into the muscle every 3 (three) months. DID NOT OBTAIN December INJECTION    [provider]  nitrofurantoin, macrocrystal-monohydrate, (MACROBID) 100 MG capsule Take 1 capsule (100 mg total) by mouth 2 (two) times daily. 06/20/17   Domenick Gong, MD  omeprazole (PRILOSEC) 20 MG capsule Take 1 capsule (20 mg total) by mouth daily. 08/09/17   Georgetta Haber, NP  ondansetron (ZOFRAN) 4 MG tablet Take 1 tablet (4 mg total) by  mouth every 8 (eight) hours as needed for nausea or vomiting. 08/09/17   Georgetta HaberBurky, Natalie B, NP    Family History Family History  Problem Relation Age of Onset  . Arthritis Mother   . Arthritis Maternal Grandmother   . Asthma Son   . Alcohol abuse Neg Hx     Social History Social History   Tobacco Use  . Smoking status: Former Smoker    Last attempt to quit: 01/21/2014    Years since quitting: 3.6  . Smokeless tobacco: Never Used  Substance Use Topics  . Alcohol use: No  . Drug use: No     Allergies   Patient has no known allergies.   Review of Systems Review of Systems  Constitutional: Positive for activity change, appetite change and chills. Negative for fever.  HENT: Positive for congestion and  sore throat. Negative for rhinorrhea.   Eyes: Negative for visual disturbance.  Respiratory: Negative for cough and shortness of breath.   Cardiovascular: Positive for chest pain.  Gastrointestinal: Positive for abdominal pain and nausea.  Genitourinary: Negative for dysuria, hematuria, vaginal bleeding and vaginal discharge.  Musculoskeletal: Positive for arthralgias and myalgias.  Skin: Negative for rash.  Neurological: Positive for headaches. Negative for dizziness and weakness.    all other systems are negative except as noted in the HPI and PMH.    Physical Exam Updated Vital Signs BP 113/61 (BP Location: Right Arm)   Pulse 61   Temp 99.9 F (37.7 C) (Oral)   Resp 16   Ht 5\' 5"  (1.651 m)   Wt 76.2 kg (168 lb)   SpO2 99%   BMI 27.96 kg/m   Physical Exam  Constitutional: She is oriented to person, place, and time. She appears well-developed and well-nourished. No distress.  HENT:  Head: Normocephalic and atraumatic.  Mouth/Throat: Oropharynx is clear and moist. No oropharyngeal exudate.  Eyes: Conjunctivae and EOM are normal. Pupils are equal, round, and reactive to light.  Neck: Normal range of motion. Neck supple.  No meningismus.  Cardiovascular: Normal rate, regular rhythm, normal heart sounds and intact distal pulses.  No murmur heard. Pulmonary/Chest: Effort normal and breath sounds normal. No respiratory distress. She exhibits no tenderness.  Abdominal: Soft. There is tenderness. There is no rebound and no guarding.  Epigastric tenderness. No guarding or rebound.  No RLQ tenderness  Musculoskeletal: Normal range of motion. She exhibits tenderness. She exhibits no edema.  Paraspinal lumbar tenderness  Neurological: She is alert and oriented to person, place, and time. No cranial nerve deficit. She exhibits normal muscle tone. Coordination normal.   5/5 strength throughout. CN 2-12 intact.Equal grip strength.   Skin: Skin is warm. Capillary refill takes less than 2  seconds.  Psychiatric: She has a normal mood and affect. Her behavior is normal.  Nursing note and vitals reviewed.    ED Treatments / Results  Labs (all labs ordered are listed, but only abnormal results are displayed) Labs Reviewed  URINALYSIS, ROUTINE W REFLEX MICROSCOPIC - Abnormal; Notable for the following components:      Result Value   APPearance HAZY (*)    All other components within normal limits  RAPID STREP SCREEN (NOT AT [facility])  BASIC METABOLIC PANEL  CBC  LIPASE, BLOOD  D-DIMER, QUANTITATIVE (NOT AT [facility])  HEPATIC FUNCTION PANEL  I-STAT TROPONIN, ED  I-STAT BETA HCG BLOOD, ED ([facility], [facility], [facility] ONLY)  I-STAT TROPONIN, ED    EKG  EKG Interpretation  Date/Time:  Sunday September 08 2017  22:05:15 EST Ventricular Rate:  75 PR Interval:  144 QRS Duration: 82 QT Interval:  346 QTC Calculation: 386 R Axis:   95 Text Interpretation:  Normal sinus rhythm with sinus arrhythmia Rightward axis Nonspecific ST abnormality Abnormal ECG No significant change was found Confirmed by Glynn Octave [phone]) on 09/09/2017 2:00:37 AM       Radiology Dg Chest 2 View  Result Date: 09/08/2017 CLINICAL DATA:  Chest pain, abdominal pain EXAM: CHEST  2 VIEW COMPARISON:  Chest radiograph 08/09/2017 FINDINGS: The heart size and mediastinal contours are within normal limits. Both lungs are clear. The visualized skeletal structures are unremarkable. IMPRESSION: Normal chest. Electronically Signed   By: Deatra Robinson M.D.   On: 09/08/2017 22:42    Procedures Procedures (including critical care time)  Medications Ordered in ED Medications  gi cocktail (Maalox,Lidocaine,Donnatal) (not administered)  acetaminophen (TYLENOL) tablet 650 mg (not administered)  ondansetron (ZOFRAN-ODT) disintegrating tablet 4 mg (4 mg Oral Given 09/08/17 2316)     Initial Impression / Assessment and Plan / ED Course  I have reviewed the triage vital signs and the nursing notes.  Pertinent labs & imaging  results that were available during my care of the patient were reviewed by me and considered in my medical decision making (see chart for details).    Patient with 1 week history of headache, body aches, chills with upper abdominal pain and nausea.  Concern for exposure to strep throat.  Abdomen is benign.  She is in no distress.  Her chest pain is atypical for ACS.  EKG is nonischemic and troponin is negative.  Chest x-ray is negative.  Troponin negative x2.  D-dimer negative.  LFTs and lipase normal.  Low suspicion for ACS or pulmonary embolism.  Patient's abdomen is benign.  She feels better after GI cocktail.  Urinalysis is negative.  Suspect patient has viral syndrome causing her headache, sore throat abdominal pain and body aches.  She is well-appearing and nontoxic.  Discussed supportive care at home with antipyretics.  Discussed restarting PPI and avoidance of gastric irritants. Follow up with PCP. Return precautions discussed.  Final Clinical Impressions(s) / ED Diagnoses   Final diagnoses:  Viral syndrome  Atypical chest pain    ED Discharge Orders    None       Isidoro Santillana, Jeannett Senior, MD 09/09/17 [phone]

## 2017-09-09 NOTE — Discharge Instructions (Signed)
Keep yourself hydrated.  Use Tylenol as needed for fever.  Follow-up with your doctor.  Return to the ED if you develop new or worsening symptoms.

## 2017-09-09 NOTE — ED Notes (Signed)
Pt drinking ginger ale  

## 2017-09-09 NOTE — ED Notes (Signed)
See providers notes

## 2017-09-11 LAB — CULTURE, GROUP A STREP (THRC)

## 2017-11-04 ENCOUNTER — Ambulatory Visit (HOSPITAL_COMMUNITY)
Admission: EM | Admit: 2017-11-04 | Discharge: 2017-11-04 | Disposition: A | Discharge status: Home / Self Care | Payer: Medicaid Other | Attending: Family Medicine | Admitting: Family Medicine

## 2017-11-04 ENCOUNTER — Encounter (HOSPITAL_COMMUNITY): Payer: Self-pay | Admitting: Emergency Medicine

## 2017-11-04 ENCOUNTER — Telehealth (HOSPITAL_COMMUNITY): Payer: Self-pay | Admitting: Emergency Medicine

## 2017-11-04 DIAGNOSIS — Z79899 Other long term (current) drug therapy: Secondary | ICD-10-CM | POA: Diagnosis not present

## 2017-11-04 DIAGNOSIS — F419 Anxiety disorder, unspecified: Secondary | ICD-10-CM | POA: Diagnosis not present

## 2017-11-04 DIAGNOSIS — M549 Dorsalgia, unspecified: Secondary | ICD-10-CM | POA: Diagnosis not present

## 2017-11-04 DIAGNOSIS — Z3202 Encounter for pregnancy test, result negative: Secondary | ICD-10-CM | POA: Diagnosis not present

## 2017-11-04 DIAGNOSIS — R109 Unspecified abdominal pain: Secondary | ICD-10-CM | POA: Diagnosis present

## 2017-11-04 DIAGNOSIS — R1032 Left lower quadrant pain: Secondary | ICD-10-CM | POA: Diagnosis not present

## 2017-11-04 DIAGNOSIS — D509 Iron deficiency anemia, unspecified: Secondary | ICD-10-CM | POA: Diagnosis not present

## 2017-11-04 DIAGNOSIS — G8929 Other chronic pain: Secondary | ICD-10-CM | POA: Diagnosis not present

## 2017-11-04 DIAGNOSIS — Z87891 Personal history of nicotine dependence: Secondary | ICD-10-CM | POA: Insufficient documentation

## 2017-11-04 DIAGNOSIS — R110 Nausea: Secondary | ICD-10-CM | POA: Diagnosis not present

## 2017-11-04 LAB — POCT URINALYSIS DIP (DEVICE)
Glucose, UA: NEGATIVE mg/dL
Leukocytes, UA: NEGATIVE
Nitrite: NEGATIVE
Protein, ur: NEGATIVE mg/dL
Specific Gravity, Urine: >=1.03 (ref 1.005–1.030)
Urobilinogen, UA: 1 mg/dL (ref 0.0–1.0)
pH: 6.5 (ref 5.0–8.0)

## 2017-11-04 LAB — POCT PREGNANCY, URINE: Preg Test, Ur: NEGATIVE

## 2017-11-04 LAB — HCG, SERUM, QUALITATIVE: Preg, Serum: NEGATIVE

## 2017-11-04 MED ORDER — METRONIDAZOLE 500 MG PO TABS: 500.0000 mg | ORAL_TABLET | Freq: Two times a day (BID) | ORAL | 0 refills | Status: DC

## 2017-11-04 MED ORDER — METRONIDAZOLE 500 MG PO TABS: 250.0000 mg | ORAL_TABLET | Freq: Two times a day (BID) | ORAL | Status: DC

## 2017-11-04 NOTE — ED Notes (Signed)
Patient requested medications be sent to pharmacy.

## 2017-11-04 NOTE — ED Provider Notes (Signed)
[facility]    CSN: [id number] Arrival date & time: 11/04/17  1804     History   Chief Complaint Chief Complaint  Patient presents with  . Abdominal Pain    HPI Melanie Hancock is a 22 y.o. female. LLQ pain Occasional diarrhea and nausea.  On Depo injections but had pregnancy on Depo.  Some discharge / urinary symptoms HPI  Past Medical History:  Diagnosis Date  . Anemia   . Anxiety   . Chronic back pain   . Depression   . Infection    UTI  . Post partum depression   . Pyelonephritis   . UTI (lower urinary tract infection)     Patient Active Problem List   Diagnosis Date Noted  . Vaginal delivery 02/04/2016  . History of postpartum depression, currently pregnant 01/23/2016  . Iron deficiency anemia 01/02/2016  . 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 Anxiety 03/28/2014  . Chronic back pain 03/28/2014  . Hx pyelonephritis 2014 05/26/2013    Past Surgical History:  Procedure Laterality Date  . DILATION AND CURETTAGE OF UTERUS    . INDUCED ABORTION    . WISDOM TOOTH EXTRACTION      OB History    Gravida Para Term Preterm AB Living   5 3 3   2 3    SAB TAB Ectopic Multiple Live Births   1 1   0 3       Home Medications    Prior to Admission medications   Medication Sig Start Date End Date Taking? Authorizing Provider  ipratropium (ATROVENT) 0.03 % nasal spray Place 2 sprays into both nostrils 3 (three) times daily as needed for rhinitis. 10/31/16   Tyrone Nine, MD  medroxyPROGESTERone (DEPO-PROVERA) 150 MG/ML injection Inject 150 mg into the muscle every 3 (three) months. DID NOT OBTAIN December INJECTION    [provider]  nitrofurantoin, macrocrystal-monohydrate, (MACROBID) 100 MG capsule Take 1 capsule (100 mg total) by mouth 2 (two) times daily. Patient not taking: Reported on 11/04/2017 06/20/17   Domenick Gong, MD  omeprazole (PRILOSEC) 20 MG capsule Take 1 capsule (20 mg total) by mouth daily. Patient not taking: Reported on  11/04/2017 09/09/17   Glynn Octave, MD  ondansetron (ZOFRAN) 4 MG tablet Take 1 tablet (4 mg total) by mouth every 8 (eight) hours as needed for nausea or vomiting. Patient not taking: Reported on 11/04/2017 08/09/17   Georgetta Haber, NP    Family History Family History  Problem Relation Age of Onset  . Arthritis Mother   . Arthritis Maternal Grandmother   . Asthma Son   . Alcohol abuse Neg Hx     Social History Social History   Tobacco Use  . Smoking status: Former Smoker    Last attempt to quit: 01/21/2014    Years since quitting: 3.7  . Smokeless tobacco: Never Used  Substance Use Topics  . Alcohol use: No  . Drug use: No     Allergies   Patient has no known allergies.   Review of Systems Review of Systems  Constitutional: Negative.   HENT: Negative.   Respiratory: Negative.   Gastrointestinal: Positive for abdominal pain, constipation and diarrhea.  Genitourinary: Positive for dysuria, frequency and vaginal discharge.     Physical Exam Triage Vital Signs ED Triage Vitals  Enc Vitals Group     BP 11/04/17 1852 112/64     Pulse Rate 11/04/17 1852 62     Resp 11/04/17 1852 18  Temp 11/04/17 1852 98.2 F (36.8 C)     Temp src --      SpO2 11/04/17 1852 100 %     Weight --      Height --      Head Circumference --      Peak Flow --      Pain Score 11/04/17 1854 8     Pain Loc --      Pain Edu? --      Excl. in GC? --    No data found.  Updated Vital Signs BP 112/64   Pulse 62   Temp 98.2 F (36.8 C)   Resp 18   SpO2 100%   Visual Acuity Right Eye Distance:   Left Eye Distance:   Bilateral Distance:    Right Eye Near:   Left Eye Near:    Bilateral Near:     Physical Exam  Constitutional: She appears well-developed and well-nourished.  Abdominal: Soft. Normal appearance and bowel sounds are normal. There is tenderness in the left lower quadrant.  Genitourinary: Uterus normal. Cervix exhibits no discharge. Right adnexum displays no  mass, no tenderness and no fullness. Left adnexum displays no mass, no tenderness and no fullness.     UC Treatments / Results  Labs (all labs ordered are listed, but only abnormal results are displayed) Labs Reviewed  POCT URINALYSIS DIP (DEVICE) - Abnormal; Notable for the following components:      Result Value   Bilirubin Urine SMALL (*)    Ketones, ur TRACE (*)    Hgb urine dipstick SMALL (*)    All other components within normal limits    EKG  EKG Interpretation None       Radiology No results found.  Procedures Procedures (including critical care time)  Medications Ordered in UC Medications - No data to display   Initial Impression / Assessment and Plan / UC Course  I have reviewed the triage vital signs and the nursing notes.  Pertinent labs & imaging results that were available during my care of the patient were reviewed by me and considered in my medical decision making (see chart for details).     Non-specific abdominal pain  No evidence of ectopic or tubal abscess on exam  Final Clinical Impressions(s) / UC Diagnoses   Final diagnoses:  None    ED Discharge Orders    None       Controlled Substance Prescriptions [place] Controlled Substance Registry consulted? Not Applicable   Frederica KusterMiller, Teila Skalsky M, MD 11/04/17 2008

## 2017-11-04 NOTE — ED Triage Notes (Addendum)
Pt states "ive been having stomach pains, it feels sharp in my side, left lower area since Friday". "last night it started back up and it fel cramping and my back hurt as well." pt is on depo shot. Pt states when she got home yesterday she noticed blood in her panties.

## 2017-11-05 LAB — URINE CYTOLOGY ANCILLARY ONLY
Chlamydia: NEGATIVE
Neisseria Gonorrhea: NEGATIVE

## 2018-01-29 ENCOUNTER — Encounter (HOSPITAL_COMMUNITY): Payer: Self-pay | Admitting: Emergency Medicine

## 2018-01-29 ENCOUNTER — Ambulatory Visit (HOSPITAL_COMMUNITY)
Admission: EM | Admit: 2018-01-29 | Discharge: 2018-01-29 | Disposition: A | Discharge status: Home / Self Care | Payer: Medicaid Other | Attending: Family Medicine | Admitting: Family Medicine

## 2018-01-29 ENCOUNTER — Other Ambulatory Visit: Payer: Self-pay

## 2018-01-29 DIAGNOSIS — F419 Anxiety disorder, unspecified: Secondary | ICD-10-CM | POA: Insufficient documentation

## 2018-01-29 DIAGNOSIS — R1013 Epigastric pain: Secondary | ICD-10-CM

## 2018-01-29 DIAGNOSIS — Z79899 Other long term (current) drug therapy: Secondary | ICD-10-CM | POA: Diagnosis not present

## 2018-01-29 DIAGNOSIS — Z3202 Encounter for pregnancy test, result negative: Secondary | ICD-10-CM

## 2018-01-29 DIAGNOSIS — N898 Other specified noninflammatory disorders of vagina: Secondary | ICD-10-CM

## 2018-01-29 DIAGNOSIS — K219 Gastro-esophageal reflux disease without esophagitis: Secondary | ICD-10-CM | POA: Diagnosis not present

## 2018-01-29 DIAGNOSIS — N912 Amenorrhea, unspecified: Secondary | ICD-10-CM | POA: Insufficient documentation

## 2018-01-29 DIAGNOSIS — R109 Unspecified abdominal pain: Secondary | ICD-10-CM | POA: Diagnosis present

## 2018-01-29 DIAGNOSIS — Z87891 Personal history of nicotine dependence: Secondary | ICD-10-CM | POA: Insufficient documentation

## 2018-01-29 DIAGNOSIS — R0789 Other chest pain: Secondary | ICD-10-CM

## 2018-01-29 DIAGNOSIS — R079 Chest pain, unspecified: Secondary | ICD-10-CM | POA: Diagnosis present

## 2018-01-29 LAB — POCT URINALYSIS DIP (DEVICE)
Bilirubin Urine: NEGATIVE
Glucose, UA: NEGATIVE mg/dL
Ketones, ur: NEGATIVE mg/dL
Leukocytes, UA: NEGATIVE
Nitrite: NEGATIVE
Protein, ur: NEGATIVE mg/dL
Specific Gravity, Urine: 1.02 (ref 1.005–1.030)
Urobilinogen, UA: 0.2 mg/dL (ref 0.0–1.0)
pH: 7.5 (ref 5.0–8.0)

## 2018-01-29 LAB — HCG, QUANTITATIVE, PREGNANCY: hCG, Beta Chain, Quant, S: <1 m[IU]/mL (ref <5)

## 2018-01-29 LAB — POCT PREGNANCY, URINE: Preg Test, Ur: NEGATIVE

## 2018-01-29 MED ORDER — OMEPRAZOLE 20 MG PO CPDR: 20.0000 mg | DELAYED_RELEASE_CAPSULE | Freq: Every day | ORAL | 0 refills | Status: DC

## 2018-01-29 MED ORDER — FLUCONAZOLE 150 MG PO TABS: 150.0000 mg | ORAL_TABLET | Freq: Every day | ORAL | 0 refills | Status: AC

## 2018-01-29 MED ORDER — METRONIDAZOLE 500 MG PO TABS: 500.0000 mg | ORAL_TABLET | Freq: Two times a day (BID) | ORAL | 0 refills | Status: AC

## 2018-01-29 NOTE — Discharge Instructions (Addendum)
Your chest pain and difficulty swallowing is due to reflux. Please start omeprazole 20 mg daily for your reflux. You may also use zantac or pepcid over the counter instead. Avoid food 3 hrs prior to bedtime. Avoid large meals. Avoid certain foods that worsen your reflux. Please follow up with your primary care doctor if your reflux doesn't improve.   I suspect that you may have a BV or yeast infection. Start flagyl and diflucan for these. I also checked you for STD, we will call you when the result comes back.   Your urine pregnancy test is negative. We are checking your HCG level, we will call you with this result.

## 2018-01-29 NOTE — ED Triage Notes (Signed)
Pt reports a centralized chest pain after eating that she describes as dull and feeling like she swallowed something and it didn't go all the way down.  She also reports sharp abdominal pain bilaterally.  She also reports that she used to get Depo Shots but she did not get her last one in March and she states she normally has spotting around the time she is due for her shot, but she did not this time. And she also states she normally gets a cycle when she is late getting her shot but she has not gotten one.  Her last shot was in January.  Pt states she does not show positive with urine tests and will need blood drawn.

## 2018-01-29 NOTE — ED Provider Notes (Signed)
[facility]    CSN: [id number] Arrival date & time: 01/29/18  1038     History   Chief Complaint Chief Complaint  Patient presents with  . Abdominal Pain  . Chest Pain  . Possible Pregnancy    HPI Melanie Hancock is a 22 y.o. female.   This is a 22 y.o. Female, with history of anxiety, chronic back pain, depression, comes in today for multiple complaints.   Patient complaints of center chest pain going down to the epigastric area x 5 days associated with food intake. Have tried TUMS and Pepid at home without relief. Other symptoms are difficulty swallowing.  She has no shortness of breath, headache or dizziness. She does have hx of GERD in the past.   Patient also complaints of possible UTI x 1 week with white vaginal discharge. She has no dysuria but urine has been cloudy. She is sexually active. Vaginal discharge have been white, more than usual. No itchiness or irritation.   She is also concern for pregnancy. Patient was on Depo for the last 2 years last given in 01.2019. She missed her Depo in  March and did not get a cycle in March, April or May. She is concern for pregnancy due to amenorrhea. Home pregnancy test have been negative but reports urine pregnancy is never accurate for her and she wants a HCG level check.     Past Medical History:  Diagnosis Date  . Anemia   . Anxiety   . Chronic back pain   . Depression   . Infection    UTI  . Post partum depression   . Pyelonephritis   . UTI (lower urinary tract infection)     Patient Active Problem List   Diagnosis Date Noted  . Vaginal delivery 02/04/2016  . History of postpartum depression, currently pregnant 01/23/2016  . Iron deficiency anemia 01/02/2016  . 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 Anxiety 03/28/2014  . Chronic back pain 03/28/2014  . Hx pyelonephritis 2014 05/26/2013    Past Surgical History:  Procedure Laterality Date  . DILATION AND CURETTAGE OF UTERUS    . INDUCED ABORTION    .  WISDOM TOOTH EXTRACTION      OB History    Gravida  5   Para  3   Term  3   Preterm      AB  2   Living  3     SAB  1   TAB  1   Ectopic      Multiple  0   Live Births  3            Home Medications    Prior to Admission medications   Medication Sig Start Date End Date Taking? Authorizing Provider  ipratropium (ATROVENT) 0.03 % nasal spray Place 2 sprays into both nostrils 3 (three) times daily as needed for rhinitis. 10/31/16   Tyrone Nine, MD  medroxyPROGESTERone (DEPO-PROVERA) 150 MG/ML injection Inject 150 mg into the muscle every 3 (three) months. DID NOT OBTAIN December INJECTION    [provider]  metroNIDAZOLE (FLAGYL) 500 MG tablet Take 1 tablet (500 mg total) by mouth 2 (two) times daily. 11/04/17   Frederica Kuster, MD  nitrofurantoin, macrocrystal-monohydrate, (MACROBID) 100 MG capsule Take 1 capsule (100 mg total) by mouth 2 (two) times daily. Patient not taking: Reported on 11/04/2017 06/20/17   Domenick Gong, MD  omeprazole (PRILOSEC) 20 MG capsule Take 1 capsule (20 mg total) by  mouth daily. Patient not taking: Reported on 11/04/2017 09/09/17   Glynn Octaveancour, Stephen, MD  ondansetron (ZOFRAN) 4 MG tablet Take 1 tablet (4 mg total) by mouth every 8 (eight) hours as needed for nausea or vomiting. Patient not taking: Reported on 11/04/2017 08/09/17   Georgetta HaberBurky, Natalie B, NP    Family History Family History  Problem Relation Age of Onset  . Arthritis Mother   . Arthritis Maternal Grandmother   . Asthma Son   . Alcohol abuse Neg Hx     Social History Social History   Tobacco Use  . Smoking status: Former Smoker    Last attempt to quit: 01/21/2014    Years since quitting: 4.0  . Smokeless tobacco: Never Used  Substance Use Topics  . Alcohol use: No  . Drug use: No     Allergies   Patient has no known allergies.   Review of Systems Review of Systems  Constitutional:       As stated in the HPI     Physical Exam Triage Vital  Signs ED Triage Vitals [01/29/18 1109]  Enc Vitals Group     BP 116/60     Pulse Rate (!) 53     Resp      Temp 98.3 F (36.8 C)     Temp Source Oral     SpO2 100 %     Weight      Height      Head Circumference      Peak Flow      Pain Score      Pain Loc      Pain Edu?      Excl. in GC?    No data found.  Updated Vital Signs BP 116/60 (BP Location: Left Arm)   Pulse (!) 53   Temp 98.3 F (36.8 C) (Oral)   LMP  (LMP Unknown)   SpO2 100%   Physical Exam  Constitutional: She appears well-developed and well-nourished.  HENT:  Head: Normocephalic.  Eyes: Pupils are equal, round, and reactive to light.  Cardiovascular: Normal rate and normal heart sounds.  No murmur heard. Pulmonary/Chest: Effort normal and breath sounds normal. She has no wheezes.  Abdominal: Normal appearance and bowel sounds are normal. There is tenderness (generalized tenderness present).  Genitourinary:  Genitourinary Comments: Labia majora and minora symmetrical without lesion. Vaginal canal pink and moist. Small amount of thick white discharge noted in the canal. -CMT, -adenxal mass or tenderness  Skin: Skin is warm and dry.  Nursing note and vitals reviewed.   UC Treatments / Results  Labs (all labs ordered are listed, but only abnormal results are displayed) Labs Reviewed  POCT URINALYSIS DIP (DEVICE) - Abnormal; Notable for the following components:      Result Value   Hgb urine dipstick TRACE (*)    All other components within normal limits  CERVICOVAGINAL ANCILLARY ONLY    EKG None  Radiology No results found.  Procedures Procedures (including critical care time)  Medications Ordered in UC Medications - No data to display  Initial Impression / Assessment and Plan / UC Course  I have reviewed the triage vital signs and the nursing notes.  Pertinent labs & imaging results that were available during my care of the patient were reviewed by me and considered in my medical  decision making (see chart for details).  Final Clinical Impressions(s) / UC Diagnoses   Final diagnoses:  Gastroesophageal reflux disease without esophagitis  Vaginal discharge  Amenorrhea  GERD: Your chest pain and difficulty swallowing is due to reflux. Please start omeprazole 20 mg daily for your reflux. You may also use zantac or pepcid over the counter instead. Avoid food 3 hrs prior to bedtime. Avoid large meals. Avoid certain foods that worsen your reflux. Please follow up with your primary care doctor if your reflux doesn't improve.   Vaginal discharge: I suspect that you may have a BV or yeast infection. Start flagyl and diflucan for these. I also checked you for STD, we will call you when the result comes back.   Amenorrhea: Your urine pregnancy test is negative. We are checking your HCG level, we will call you with this result.   Discharge Instructions   None    ED Prescriptions    None     Controlled Substance Prescriptions [place] Controlled Substance Registry consulted? Not Applicable   Lucia Estelle, NP 01/29/18 1206

## 2018-01-30 LAB — CERVICOVAGINAL ANCILLARY ONLY
Bacterial vaginitis: POSITIVE — AB
Candida vaginitis: NEGATIVE
Chlamydia: NEGATIVE
Neisseria Gonorrhea: NEGATIVE
Trichomonas: NEGATIVE

## 2018-01-31 ENCOUNTER — Telehealth (HOSPITAL_COMMUNITY): Payer: Self-pay

## 2018-01-31 NOTE — Telephone Encounter (Signed)
Bacterial Vaginosis test is positive.  Prescription for metronidazole was given at the urgent care visit. Pt contacted regarding results. Answered all questions. Verbalized understanding.   

## 2018-02-17 ENCOUNTER — Emergency Department (HOSPITAL_COMMUNITY): Payer: Medicaid Other

## 2018-02-17 ENCOUNTER — Other Ambulatory Visit: Payer: Self-pay

## 2018-02-17 ENCOUNTER — Emergency Department (HOSPITAL_COMMUNITY)
Admission: EM | Admit: 2018-02-17 | Discharge: 2018-02-18 | Disposition: A | Discharge status: Home / Self Care | Payer: Medicaid Other | Attending: Emergency Medicine | Admitting: Emergency Medicine

## 2018-02-17 DIAGNOSIS — Z87891 Personal history of nicotine dependence: Secondary | ICD-10-CM | POA: Diagnosis not present

## 2018-02-17 DIAGNOSIS — R1013 Epigastric pain: Secondary | ICD-10-CM | POA: Diagnosis not present

## 2018-02-17 DIAGNOSIS — Z79899 Other long term (current) drug therapy: Secondary | ICD-10-CM | POA: Diagnosis not present

## 2018-02-17 LAB — BASIC METABOLIC PANEL
Anion gap: 7 (ref 5–15)
BUN: 11 mg/dL (ref 6–20)
CO2: 28 mmol/L (ref 22–32)
Calcium: 9.2 mg/dL (ref 8.9–10.3)
Chloride: 107 mmol/L (ref 98–111)
Creatinine, Ser: 0.86 mg/dL (ref 0.44–1.00)
GFR calc Af Amer: >60 mL/min (ref >60)
GFR calc non Af Amer: >60 mL/min (ref >60)
Glucose, Bld: 93 mg/dL (ref 70–99)
Potassium: 4.2 mmol/L (ref 3.5–5.1)
Sodium: 142 mmol/L (ref 135–145)

## 2018-02-17 LAB — I-STAT TROPONIN, ED: Troponin i, poc: 0 ng/mL (ref 0.00–0.08)

## 2018-02-17 LAB — CBC
HCT: 41.4 % (ref 36.0–46.0)
Hemoglobin: 13.7 g/dL (ref 12.0–15.0)
MCH: 29.5 pg (ref 26.0–34.0)
MCHC: 33.1 g/dL (ref 30.0–36.0)
MCV: 89.2 fL (ref 78.0–100.0)
Platelets: 255 10*3/uL (ref 150–400)
RBC: 4.64 MIL/uL (ref 3.87–5.11)
RDW: 13 % (ref 11.5–15.5)
WBC: 3.6 10*3/uL — ABNORMAL LOW (ref 4.0–10.5)

## 2018-02-17 LAB — I-STAT BETA HCG BLOOD, ED (MC, WL, AP ONLY): I-stat hCG, quantitative: <5 m[IU]/mL (ref <5)

## 2018-02-17 NOTE — ED Triage Notes (Signed)
Pt reports pain in her head, chest, and abdomen.  Pt reports she is unsure if she is pregnant, as she has not had a period in 3 months and was on depot and is not any longer.  Pt reports she tries to go to the bathroom in the morning, but still feels bloated after a BM. Pt reports it often takes "multiple blood draws" to confirm her pregnancy.

## 2018-02-17 NOTE — ED Provider Notes (Signed)
[facility]-EMERGENCY DEPT Provider Note   CSN: [id number] Arrival date & time: 02/17/18  2156     History   Chief Complaint Chief Complaint  Patient presents with  . Headache  . Chest Pain    HPI Melanie Hancock is a 22 y.o. female.  The history is provided by the patient. No language interpreter was used.  Headache    Chest Pain   Associated symptoms include headaches.     22 year old female with history of chronic back pain, anxiety, anemia, depression presenting with multiple complaints.  Patient report for the past month she has had recurrent headache.  She described headache as a bitemporal throbbing sensation, waxing waning, happens sporadically throughout the day.  Sometimes she described as a brain freeze sensation.  Furthermore, she complains of epigastric abdominal pain radiates to her mid chest happens sporadically more noticeable at nighttime.  She endorsed occasional nausea without vomiting.  She has not had a menstrual period for quite a while after being off Depo and was concerned about pregnancy.  She denies any associated fever, chills, productive cough, shortness of breath, dysuria, hematuria, vaginal bleeding or vaginal discharge.  She reportedly was seen for similar symptoms a few weeks ago and was diagnosed with heartburn.  She mentioned her symptoms still persist but did not have an opportunity to fill the medication prescribed.  She admits to increased alcohol usage within the past several weeks due to having been involving many celebrations.  She admits to using marijuana on occasion.  She is actively trying to quit tobacco use.  She denies any active pain currently.  Past Medical History:  Diagnosis Date  . Anemia   . Anxiety   . Chronic back pain   . Depression   . Infection    UTI  . Post partum depression   . Pyelonephritis   . UTI (lower urinary tract infection)     Patient Active Problem List   Diagnosis Date Noted  .  Vaginal delivery 02/04/2016  . History of postpartum depression, currently pregnant 01/23/2016  . Iron deficiency anemia 01/02/2016  . 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 Anxiety 03/28/2014  . Chronic back pain 03/28/2014  . Hx pyelonephritis 2014 05/26/2013    Past Surgical History:  Procedure Laterality Date  . DILATION AND CURETTAGE OF UTERUS    . INDUCED ABORTION    . WISDOM TOOTH EXTRACTION       OB History    Gravida  5   Para  3   Term  3   Preterm      AB  2   Living  3     SAB  1   TAB  1   Ectopic      Multiple  0   Live Births  3            Home Medications    Prior to Admission medications   Medication Sig Start Date End Date Taking? Authorizing Provider  ipratropium (ATROVENT) 0.03 % nasal spray Place 2 sprays into both nostrils 3 (three) times daily as needed for rhinitis. 10/31/16   Tyrone Nine, MD  medroxyPROGESTERone (DEPO-PROVERA) 150 MG/ML injection Inject 150 mg into the muscle every 3 (three) months. DID NOT OBTAIN December INJECTION    [provider]  nitrofurantoin, macrocrystal-monohydrate, (MACROBID) 100 MG capsule Take 1 capsule (100 mg total) by mouth 2 (two) times daily. Patient not taking: Reported on 11/04/2017 06/20/17   Domenick Gong, MD  omeprazole (PRILOSEC) 20 MG capsule Take 1 capsule (20 mg total) by mouth daily. 01/29/18 02/28/18  Lucia EstelleZheng, Feng, NP  ondansetron (ZOFRAN) 4 MG tablet Take 1 tablet (4 mg total) by mouth every 8 (eight) hours as needed for nausea or vomiting. Patient not taking: Reported on 11/04/2017 08/09/17   Georgetta HaberBurky, Natalie B, NP    Family History Family History  Problem Relation Age of Onset  . Arthritis Mother   . Arthritis Maternal Grandmother   . Asthma Son   . Alcohol abuse Neg Hx     Social History Social History   Tobacco Use  . Smoking status: Former Smoker    Last attempt to quit: 01/21/2014    Years since quitting: 4.0  . Smokeless tobacco: Never Used  Substance Use Topics  .  Alcohol use: No  . Drug use: No     Allergies   Patient has no known allergies.   Review of Systems Review of Systems  Cardiovascular: Positive for chest pain.  Neurological: Positive for headaches.  All other systems reviewed and are negative.    Physical Exam Updated Vital Signs BP 116/65 (BP Location: Right Arm)   Pulse 66   Temp 98.6 F (37 C) (Oral)   Resp 18   LMP  (LMP Unknown)   SpO2 100%   Physical Exam  Constitutional: She is oriented to person, place, and time. She appears well-developed and well-nourished. No distress.  HENT:  Head: Atraumatic.  Mouth/Throat: Oropharynx is clear and moist.  Eyes: Pupils are equal, round, and reactive to light. Conjunctivae and EOM are normal.  Neck: Normal range of motion. Neck supple. No neck rigidity.  Cardiovascular: Normal rate and regular rhythm.  Pulmonary/Chest: Effort normal and breath sounds normal.  Abdominal: Soft. There is tenderness (Mild epigastric tenderness without guarding or rebound tenderness.).  Neurological: She is alert and oriented to person, place, and time. She has normal strength. GCS eye subscore is 4. GCS verbal subscore is 5. GCS motor subscore is 6.  Skin: No rash noted.  Psychiatric: She has a normal mood and affect.  Nursing note and vitals reviewed.    ED Treatments / Results  Labs (all labs ordered are listed, but only abnormal results are displayed) Labs Reviewed  CBC - Abnormal; Notable for the following components:      Result Value   WBC 3.6 (*)    All other components within normal limits  BASIC METABOLIC PANEL  I-STAT TROPONIN, ED  I-STAT BETA HCG BLOOD, ED ([facility], [facility], [facility] ONLY)    EKG None   Date: 02/17/2018  Rate: 73  Rhythm: normal sinus rhythm  QRS Axis: normal  Intervals: normal  ST/T Wave abnormalities: normal  Conduction Disutrbances: none  Narrative Interpretation:   Old EKG Reviewed: No significant changes noted     Radiology Dg Chest 2 View  Result  Date: 02/17/2018 CLINICAL DATA:  Chest pain EXAM: CHEST - 2 VIEW COMPARISON:  09/08/2017 FINDINGS: The heart size and mediastinal contours are within normal limits. Both lungs are clear. The visualized skeletal structures are unremarkable. IMPRESSION: No active cardiopulmonary disease. Electronically Signed   By: Jasmine PangKim  Fujinaga M.D.   On: 02/17/2018 23:09    Procedures Procedures (including critical care time)  Medications Ordered in ED Medications - No data to display   Initial Impression / Assessment and Plan / ED Course  I have reviewed the triage vital signs and the nursing notes.  Pertinent labs & imaging results that were available during my  care of the patient were reviewed by me and considered in my medical decision making (see chart for details).     BP 116/65 (BP Location: Right Arm)   Pulse 66   Temp 98.6 F (37 C) (Oral)   Resp 18   LMP  (LMP Unknown)   SpO2 100%    Final Clinical Impressions(s) / ED Diagnoses   Final diagnoses:  Epigastric pain    ED Discharge Orders        Ordered    omeprazole (PRILOSEC) 20 MG capsule  2 times daily before meals     02/18/18 0033    ranitidine (ZANTAC) 150 MG capsule  2 times daily     02/18/18 0033     11:47 PM Patient here with multiple complaints.  Most of her symptoms seems to be suggestive of acid reflux.  Pain is atypical of ACS.  No shortness of breath concerning for PE.  Headache is likely tension headache.  No red flags.  She was concern for potential pregnancy, her pregnancy test is negative today.  Chest x-ray unremarkable, EKG is reassuring, labs are reassuring as well.  Encourage patient to take PPI, and H2 blocker for her symptoms.  Outpatient recommendation suggested.  Return precautions discussed.   Fayrene Helper, PA-C 02/18/18 0033    Nira Conn, MD 02/18/18 [phone]

## 2018-02-18 MED ORDER — OMEPRAZOLE 20 MG PO CPDR: 20.0000 mg | DELAYED_RELEASE_CAPSULE | Freq: Two times a day (BID) | ORAL | 0 refills | Status: DC

## 2018-02-18 MED ORDER — RANITIDINE HCL 150 MG PO CAPS: 150.0000 mg | ORAL_CAPSULE | Freq: Two times a day (BID) | ORAL | 0 refills | Status: DC

## 2018-03-14 ENCOUNTER — Ambulatory Visit (HOSPITAL_COMMUNITY)
Admission: EM | Admit: 2018-03-14 | Discharge: 2018-03-14 | Disposition: A | Discharge status: Home / Self Care | Payer: Self-pay | Attending: Emergency Medicine | Admitting: Emergency Medicine

## 2018-03-14 ENCOUNTER — Encounter (HOSPITAL_COMMUNITY): Payer: Self-pay

## 2018-03-14 DIAGNOSIS — M62838 Other muscle spasm: Secondary | ICD-10-CM

## 2018-03-14 MED ORDER — METHOCARBAMOL 750 MG PO TABS: 750.0000 mg | ORAL_TABLET | ORAL | 0 refills | Status: DC

## 2018-03-14 MED ORDER — IBUPROFEN 600 MG PO TABS: 600.0000 mg | ORAL_TABLET | Freq: Four times a day (QID) | ORAL | 0 refills | Status: DC | PRN

## 2018-03-14 NOTE — ED Triage Notes (Signed)
Pt presents with complaints of pain in her neck that shoots down into her shoulder and arm, also complains of a tingling sensation down her arm x 1 day. Reports it feels like nerve pain.

## 2018-03-14 NOTE — ED Provider Notes (Signed)
HPI  SUBJECTIVE:  Melanie Hancock is a right-handed 22 y.o. female who presents with 3 days of left trapezial pain.  Today she states that she has sharp pain and tingling radiating down her arm.  She denies trauma to her neck or shoulder.  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  She denies sleeping on her neck wrong.  No weakness in her shoulder or arm.  She denies fevers.  She denies heavy lifting or carrying heavy things with her left arm.  No symptoms like this previously.  She tried massage.  She has not tried any medications for this.  Symptoms are better with holding her left arm in flexion up against her body, worse with shrugging her shoulder, sitting up straight, rotating her head to the right.  She has a past medical history of GERD.  No history of osteoporosis, steroid use, cancer, diabetes, hypertension.  LMP: December.  Last Depo-Provera shot was in January.  She had a negative beta quant on 7/1 when she was seen in the ED for epigastric pain.  She denies the possibility of being pregnant and states we do not need to check.  PMD: None.    Past Medical History:  Diagnosis Date  . Anemia   . Anxiety   . Chronic back pain   . Depression   . Infection    UTI  . Post partum depression   . Pyelonephritis   . UTI (lower urinary tract infection)     Past Surgical History:  Procedure Laterality Date  . DILATION AND CURETTAGE OF UTERUS    . INDUCED ABORTION    . WISDOM TOOTH EXTRACTION      Family History  Problem Relation Age of Onset  . Arthritis Mother   . Arthritis Maternal Grandmother   . Asthma Son   . Alcohol abuse Neg Hx     Social History   Tobacco Use  . Smoking status: Former Smoker    Last attempt to quit: 01/21/2014    Years since quitting: 4.1  . Smokeless tobacco: Never Used  Substance Use Topics  . Alcohol use: No  . Drug use: No    No current  facility-administered medications for this encounter.   Current Outpatient Medications:  .  ibuprofen (ADVIL,MOTRIN) 600 MG tablet, Take 1 tablet (600 mg total) by mouth every 6 (six) hours as needed., Disp: 30 tablet, Rfl: 0 .  ipratropium (ATROVENT) 0.03 % nasal spray, Place 2 sprays into both nostrils 3 (three) times daily as needed for rhinitis., Disp: 30 mL, Rfl: 0 .  medroxyPROGESTERone (DEPO-PROVERA) 150 MG/ML injection, Inject 150 mg into the muscle every 3 (three) months. DID NOT OBTAIN December INJECTION, Disp: , Rfl:  .  methocarbamol (ROBAXIN) 750 MG tablet, Take 1 tablet (750 mg total) by mouth every 4 (four) hours., Disp: 40 tablet, Rfl: 0 .  omeprazole (PRILOSEC) 20 MG capsule, Take 1 capsule (20 mg total) by mouth 2 (two) times daily before a meal for 15 days., Disp: 30 capsule, Rfl: 0 .  ranitidine (ZANTAC) 150 MG capsule, Take 1 capsule (150 mg total) by mouth 2 (two) times daily., Disp: 30 capsule, Rfl: 0  No Known Allergies   ROS  As noted in HPI.   Physical Exam  BP (!) 123/57 (BP Location: Right Arm)   Pulse 87   Temp 98.2 F (36.8 C) (Oral)  Resp 18   SpO2 100%   Constitutional: Well developed, well nourished, no acute distress Eyes:  EOMI, conjunctiva normal bilaterally HENT: Normocephalic, atraumatic,mucus membranes moist Respiratory: Normal inspiratory effort Cardiovascular: Normal rate GI: nondistended skin: No rash, skin intact Musculoskeletal: no deformities.  No C-spine tenderness.  Positive left trapezial tenderness, muscle spasm.  No pain along the clavicle, AC joint, shoulder joint.  Pain aggravated with arm abduction past 90 degrees.  Negative empty can test.  Negative drop test.  Negative liftoff test.  No tenderness along the bicipital tendon.  Pain aggravated with external rotation of the shoulder.  No pain with internal rotation of the shoulder.  Sensation to light touch and temperature intact over the entire arm.  Grip strength 5/5 and equal  bilaterally.  RP 2+. Neurologic: Alert & oriented x 3, no focal neuro deficits Psychiatric: Speech and behavior appropriate   ED Course   Medications - No data to display  No orders of the defined types were placed in this encounter.   No results found for this or any previous visit (from the past 24 hour(s)). No results found.  ED Clinical Impression  Trapezius muscle spasm   ED Assessment/Plan  Presentation consistent with a left trapezius strain with muscle spasm.  She does do a lot of repetitive lifting with her left arm/shoulder at her job in the [employer].  Doubt rotator cuff injury.  Doubt cardiac etiology.  Imaging deferred in the absence of trauma or bony tenderness.  Will send home with ibuprofen 600 mg to take with 1 g of Tylenol together 3 or 4 times a day as needed for pain, Robaxin, advised deep tissue massage.  Will provide a primary care referral list for ongoing care.  Discussed MDM, treatment plan, and plan for follow-up with patient. Discussed sn/sx that should prompt return to the ED. patient agrees with plan.   Meds ordered this encounter  Medications  . ibuprofen (ADVIL,MOTRIN) 600 MG tablet    Sig: Take 1 tablet (600 mg total) by mouth every 6 (six) hours as needed.    Dispense:  30 tablet    Refill:  0  . methocarbamol (ROBAXIN) 750 MG tablet    Sig: Take 1 tablet (750 mg total) by mouth every 4 (four) hours.    Dispense:  40 tablet    Refill:  0    *This clinic note was created using Scientist, clinical (histocompatibility and immunogenetics). Therefore, there may be occasional mistakes despite careful proofreading.   ?   Domenick Gong, MD 03/14/18 1444

## 2018-03-14 NOTE — Discharge Instructions (Addendum)
Take 600 mg of ibuprofen together with 1 g of Tylenol 3 or 4 times a day for pain and swelling.  The Robaxin will help with the muscle spasms.  Try and use your other arm at work for the next several days.  Can also try deep tissue massage at [employer] on [address].  This may be particularly helpful.  Below is a list of primary care practices who are taking new patients for you to follow-up with. [facility] [address] [place], [place] [address] [phone]  [facility] [phone] [address] [place] [address]  [facility]: [address] [place] [id number]  [phone]  [facility]: [address] [place] [id number]   [phone]  [facility]: [address] [place] [address]  [phone]  [facility] : [address] [place] [phone]  [facility]: [address] [place] [phone]  [facility]: [address] [place] [id number] [phone]  Dr. Oneal GroutMahima Pandey [address] [facility] [facility] [place] [id number]  [phone]  Dr. Jackie PlumGeorge Osei-Bonsu, [facility]. [address] [place], [place] [address]  [phone]  Go to www.goodrx.com to look up your medications. This will give you a list of where you can find your prescriptions at the most affordable prices. Or ask the pharmacist what the cash price is, or if they have any other discount programs available to help make your medication more affordable. This can be less expensive than what you would pay with insurance.

## 2018-05-04 ENCOUNTER — Ambulatory Visit (HOSPITAL_COMMUNITY)
Admission: EM | Admit: 2018-05-04 | Discharge: 2018-05-04 | Disposition: A | Discharge status: Home / Self Care | Payer: Medicaid Other | Attending: Internal Medicine | Admitting: Internal Medicine

## 2018-05-04 ENCOUNTER — Encounter (HOSPITAL_COMMUNITY): Payer: Self-pay | Admitting: *Deleted

## 2018-05-04 DIAGNOSIS — Z113 Encounter for screening for infections with a predominantly sexual mode of transmission: Secondary | ICD-10-CM | POA: Diagnosis not present

## 2018-05-04 DIAGNOSIS — Z202 Contact with and (suspected) exposure to infections with a predominantly sexual mode of transmission: Secondary | ICD-10-CM | POA: Diagnosis present

## 2018-05-04 DIAGNOSIS — Z3202 Encounter for pregnancy test, result negative: Secondary | ICD-10-CM

## 2018-05-04 DIAGNOSIS — Z87891 Personal history of nicotine dependence: Secondary | ICD-10-CM | POA: Insufficient documentation

## 2018-05-04 DIAGNOSIS — R350 Frequency of micturition: Secondary | ICD-10-CM | POA: Diagnosis present

## 2018-05-04 LAB — POCT URINALYSIS DIP (DEVICE)
Bilirubin Urine: NEGATIVE
Glucose, UA: NEGATIVE mg/dL
Hgb urine dipstick: NEGATIVE
Ketones, ur: NEGATIVE mg/dL
Leukocytes, UA: NEGATIVE
Nitrite: NEGATIVE
Protein, ur: NEGATIVE mg/dL
Specific Gravity, Urine: 1.02 (ref 1.005–1.030)
Urobilinogen, UA: 0.2 mg/dL (ref 0.0–1.0)
pH: 8.5 — ABNORMAL HIGH (ref 5.0–8.0)

## 2018-05-04 LAB — POCT PREGNANCY, URINE: Preg Test, Ur: NEGATIVE

## 2018-05-04 NOTE — Discharge Instructions (Signed)
Your urine was negative for UTI, pregnancy.  Cytology sent, you will be contacted with any positive results that requires further treatment. Refrain from sexual activity and alcohol use for the next 7 days. Monitor for any worsening of symptoms, fever, abdominal pain, nausea, vomiting, to follow up for reevaluation.

## 2018-05-04 NOTE — ED Provider Notes (Signed)
[facility]    CSN: [id number] Arrival date & time: 05/04/18  1109     History   Chief Complaint Chief Complaint  Patient presents with  . Exposure to STD    HPI Melanie Hancock is a 22 y.o. female.   22 year old female comes in for STD check.  States she has a new partner, and thought he may have penile discharge, so would like to be tested.  She has had a few days of urinary frequency without dysuria or hematuria. Felt "weird when I pee".  Denies fever, chills, sweats.  Denies abdominal pain, nausea, vomiting.  Denies vaginal discharge, itching, pain.  Patient states was on Depo-Medrol injections, last injection January 2019, and has not had a full cycle since.  She has had intermittent spotting for a day or 2 at a time.  Last spotting episode 3 months ago.  She is currently sexually active with one female partner, no condom use.     Past Medical History:  Diagnosis Date  . Anemia   . Anxiety   . Chronic back pain   . Depression   . Infection    UTI  . Post partum depression   . Pyelonephritis   . UTI (lower urinary tract infection)     Patient Active Problem List   Diagnosis Date Noted  . Vaginal delivery 02/04/2016  . History of postpartum depression, currently pregnant 01/23/2016  . Iron deficiency anemia 01/02/2016  . 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 Anxiety 03/28/2014  . Chronic back pain 03/28/2014  . Hx pyelonephritis 2014 05/26/2013    Past Surgical History:  Procedure Laterality Date  . DILATION AND CURETTAGE OF UTERUS    . INDUCED ABORTION    . WISDOM TOOTH EXTRACTION      OB History    Gravida  5   Para  3   Term  3   Preterm      AB  2   Living  3     SAB  1   TAB  1   Ectopic      Multiple  0   Live Births  3            Home Medications    Prior to Admission medications   Medication Sig Start Date End Date Taking? Authorizing Provider  ibuprofen (ADVIL,MOTRIN) 600 MG tablet Take 1 tablet (600 mg total) by  mouth every 6 (six) hours as needed. 03/14/18   Domenick Gong, MD  omeprazole (PRILOSEC) 20 MG capsule Take 1 capsule (20 mg total) by mouth 2 (two) times daily before a meal for 15 days. 02/18/18 03/05/18  Fayrene Helper, PA-C    Family History Family History  Problem Relation Age of Onset  . Arthritis Mother   . Arthritis Maternal Grandmother   . Asthma Son   . Alcohol abuse Neg Hx     Social History Social History   Tobacco Use  . Smoking status: Former Smoker    Last attempt to quit: 01/21/2014    Years since quitting: 4.2  . Smokeless tobacco: Never Used  Substance Use Topics  . Alcohol use: No  . Drug use: No     Allergies   Patient has no known allergies.   Review of Systems Review of Systems  Reason unable to perform ROS: See HPI as above.     Physical Exam Triage Vital Signs ED Triage Vitals  Enc Vitals Group     BP 05/04/18 1139 (!)  106/57     Pulse Rate 05/04/18 1139 (!) 58     Resp 05/04/18 1139 16     Temp 05/04/18 1139 98.1 F (36.7 C)     Temp Source 05/04/18 1139 Oral     SpO2 05/04/18 1139 99 %     Weight --      Height --      Head Circumference --      Peak Flow --      Pain Score 05/04/18 1140 0     Pain Loc --      Pain Edu? --      Excl. in GC? --    No data found.  Updated Vital Signs BP (!) 106/57   Pulse (!) 58   Temp 98.1 F (36.7 C) (Oral)   Resp 16   SpO2 99%   Physical Exam  Constitutional: She is oriented to person, place, and time. She appears well-developed and well-nourished. No distress.  HENT:  Head: Normocephalic and atraumatic.  Eyes: Pupils are equal, round, and reactive to light. Conjunctivae are normal.  Cardiovascular: Normal rate, regular rhythm and normal heart sounds. Exam reveals no gallop and no friction rub.  No murmur heard. Pulmonary/Chest: Effort normal and breath sounds normal. She has no wheezes. She has no rales.  Abdominal: Soft. Bowel sounds are normal. She exhibits no mass. There is no  tenderness. There is no rebound, no guarding and no CVA tenderness.  Neurological: She is alert and oriented to person, place, and time.  Skin: Skin is warm and dry.  Psychiatric: She has a normal mood and affect. Her behavior is normal. Judgment normal.     UC Treatments / Results  Labs (all labs ordered are listed, but only abnormal results are displayed) Labs Reviewed  POCT URINALYSIS DIP (DEVICE) - Abnormal; Notable for the following components:      Result Value   pH 8.5 (*)    All other components within normal limits  POCT PREGNANCY, URINE  CERVICOVAGINAL ANCILLARY ONLY    EKG None  Radiology No results found.  Procedures Procedures (including critical care time)  Medications Ordered in UC Medications - No data to display  Initial Impression / Assessment and Plan / UC Course  I have reviewed the triage vital signs and the nursing notes.  Pertinent labs & imaging results that were available during my care of the patient were reviewed by me and considered in my medical decision making (see chart for details).    Urine negative for UTI, pregnancy.  Cytology sent.  Return precautions given.  Patient expresses understanding and agrees to plan.  Final Clinical Impressions(s) / UC Diagnoses   Final diagnoses:  Possible exposure to STD    ED Prescriptions    None        Belinda FisherYu, Ajit Errico V, PA-C 05/04/18 1224

## 2018-05-04 NOTE — ED Triage Notes (Signed)
Pt reports new sexual partner; wishes to be tested for STDs, but has no sxs or no known exposure.

## 2018-05-05 LAB — CERVICOVAGINAL ANCILLARY ONLY
Chlamydia: NEGATIVE
Neisseria Gonorrhea: NEGATIVE
Trichomonas: NEGATIVE

## 2018-05-08 ENCOUNTER — Ambulatory Visit (HOSPITAL_COMMUNITY)
Admission: EM | Admit: 2018-05-08 | Discharge: 2018-05-08 | Disposition: A | Discharge status: Home / Self Care | Payer: Medicaid Other | Attending: Family Medicine | Admitting: Family Medicine

## 2018-05-08 ENCOUNTER — Other Ambulatory Visit: Payer: Self-pay

## 2018-05-08 ENCOUNTER — Telehealth (HOSPITAL_COMMUNITY): Payer: Self-pay | Admitting: Emergency Medicine

## 2018-05-08 ENCOUNTER — Encounter (HOSPITAL_COMMUNITY): Payer: Self-pay

## 2018-05-08 DIAGNOSIS — N390 Urinary tract infection, site not specified: Secondary | ICD-10-CM | POA: Diagnosis not present

## 2018-05-08 DIAGNOSIS — R350 Frequency of micturition: Secondary | ICD-10-CM | POA: Diagnosis not present

## 2018-05-08 DIAGNOSIS — R300 Dysuria: Secondary | ICD-10-CM | POA: Diagnosis not present

## 2018-05-08 LAB — POCT URINALYSIS DIP (DEVICE)
Bilirubin Urine: NEGATIVE
Glucose, UA: NEGATIVE mg/dL
Nitrite: POSITIVE — AB
Protein, ur: NEGATIVE mg/dL
Specific Gravity, Urine: 1.02 (ref 1.005–1.030)
Urobilinogen, UA: 0.2 mg/dL (ref 0.0–1.0)
pH: 7 (ref 5.0–8.0)

## 2018-05-08 MED ORDER — NITROFURANTOIN MONOHYD MACRO 100 MG PO CAPS: 100.0000 mg | ORAL_CAPSULE | Freq: Two times a day (BID) | ORAL | 0 refills | Status: AC

## 2018-05-08 NOTE — Telephone Encounter (Signed)
Pt called requesting meds for UTI; pt instructed needed to follow up as no UTI noted on urine from last visit

## 2018-05-08 NOTE — ED Provider Notes (Signed)
[facility]    CSN: [id number] Arrival date & time: 05/08/18  1611     History   Chief Complaint Chief Complaint  Patient presents with  . Appointment    4 pm  . Urinary Tract Infection    HPI Melanie Hancock is a 22 y.o. female.   Melanie Hancock presents with complaints of urinary frequency and stinging with urination. Started approximately 1.5 weeks ago. Feels worse. States feels similar to previous UTI's she has had in the past. Occasional low back and pelvic discomfort which comes and goes. No fevers. No vaginal symptoms. Was seen here 9/15 for std screen which was negative, negative for pregnancy. Depo last in January of this year, no period since, occasional spotting. No current bleeding. Without contributing medical history.     ROS per HPI.      Past Medical History:  Diagnosis Date  . Anemia   . Anxiety   . Chronic back pain   . Depression   . Infection    UTI  . Post partum depression   . Pyelonephritis   . UTI (lower urinary tract infection)     Patient Active Problem List   Diagnosis Date Noted  . Vaginal delivery 02/04/2016  . History of postpartum depression, currently pregnant 01/23/2016  . Iron deficiency anemia 01/02/2016  . 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 Anxiety 03/28/2014  . Chronic back pain 03/28/2014  . Hx pyelonephritis 2014 05/26/2013    Past Surgical History:  Procedure Laterality Date  . DILATION AND CURETTAGE OF UTERUS    . INDUCED ABORTION    . WISDOM TOOTH EXTRACTION      OB History    Gravida  5   Para  3   Term  3   Preterm      AB  2   Living  3     SAB  1   TAB  1   Ectopic      Multiple  0   Live Births  3            Home Medications    Prior to Admission medications   Medication Sig Start Date End Date Taking? Authorizing Provider  ibuprofen (ADVIL,MOTRIN) 600 MG tablet Take 1 tablet (600 mg total) by mouth every 6 (six) hours as needed. 03/14/18   Domenick Gong, MD  nitrofurantoin,  macrocrystal-monohydrate, (MACROBID) 100 MG capsule Take 1 capsule (100 mg total) by mouth 2 (two) times daily for 5 days. 05/08/18 05/13/18  Georgetta Haber, NP  omeprazole (PRILOSEC) 20 MG capsule Take 1 capsule (20 mg total) by mouth 2 (two) times daily before a meal for 15 days. 02/18/18 03/05/18  Fayrene Helper, PA-C    Family History Family History  Problem Relation Age of Onset  . Arthritis Mother   . Arthritis Maternal Grandmother   . Asthma Son   . Alcohol abuse Neg Hx     Social History Social History   Tobacco Use  . Smoking status: Former Smoker    Last attempt to quit: 01/21/2014    Years since quitting: 4.2  . Smokeless tobacco: Never Used  Substance Use Topics  . Alcohol use: No  . Drug use: No     Allergies   Patient has no known allergies.   Review of Systems Review of Systems   Physical Exam Triage Vital Signs ED Triage Vitals  Enc Vitals Group     BP 05/08/18 1626 105/65     Pulse Rate  05/08/18 1626 68     Resp 05/08/18 1626 16     Temp 05/08/18 1626 98.6 F (37 C)     Temp Source 05/08/18 1626 Oral     SpO2 05/08/18 1626 99 %     Weight 05/08/18 1622 170 lb (77.1 kg)     Height --      Head Circumference --      Peak Flow --      Pain Score --      Pain Loc --      Pain Edu? --      Excl. in GC? --    No data found.  Updated Vital Signs BP 105/65 (BP Location: Right Arm)   Pulse 68   Temp 98.6 F (37 C) (Oral)   Resp 16   Wt 170 lb (77.1 kg)   SpO2 99%   BMI 28.29 kg/m   Visual Acuity Right Eye Distance:   Left Eye Distance:   Bilateral Distance:    Right Eye Near:   Left Eye Near:    Bilateral Near:     Physical Exam  Constitutional: She is oriented to person, place, and time. She appears well-developed and well-nourished. No distress.  Cardiovascular: Normal rate, regular rhythm and normal heart sounds.  Pulmonary/Chest: Effort normal and breath sounds normal.  Abdominal: Soft. There is no tenderness. There is no rigidity,  no rebound, no guarding and no CVA tenderness.  Mild suprapubic pressure on palpation  Neurological: She is alert and oriented to person, place, and time.  Skin: Skin is warm and dry.     UC Treatments / Results  Labs (all labs ordered are listed, but only abnormal results are displayed) Labs Reviewed  POCT URINALYSIS DIP (DEVICE) - Abnormal; Notable for the following components:      Result Value   Ketones, ur TRACE (*)    Hgb urine dipstick TRACE (*)    Nitrite POSITIVE (*)    Leukocytes, UA SMALL (*)    All other components within normal limits    EKG None  Radiology No results found.  Procedures Procedures (including critical care time)  Medications Ordered in UC Medications - No data to display  Initial Impression / Assessment and Plan / UC Course  I have reviewed the triage vital signs and the nursing notes.  Pertinent labs & imaging results that were available during my care of the patient were reviewed by me and considered in my medical decision making (see chart for details).     Urine dip and history and physical exam consistent with UTI. Course of macrobid provided. If symptoms worsen or do not improve in the next week to return to be seen or to follow up with PCP.  Patient verbalized understanding and agreeable to plan.    Patient declines AVS today.  Final Clinical Impressions(s) / UC Diagnoses   Final diagnoses:  Lower urinary tract infectious disease     Discharge Instructions     Drink plenty of water to empty bladder regularly. Avoid alcohol and caffeine as these may irritate the bladder.   Complete course of antibiotics.  If symptoms worsen or do not improve in the next week to return to be seen or to follow up with your PCP.     ED Prescriptions    Medication Sig Dispense Auth. Provider   nitrofurantoin, macrocrystal-monohydrate, (MACROBID) 100 MG capsule Take 1 capsule (100 mg total) by mouth 2 (two) times daily for 5 days. 10 capsule  Georgetta HaberBurky, Lorece Keach B, NP     Controlled Substance Prescriptions [place] Controlled Substance Registry consulted? Not Applicable   Georgetta HaberBurky, Deanglo Hissong B, NP 05/08/18 1656

## 2018-05-08 NOTE — ED Triage Notes (Signed)
Pt  States she has a UTI x 3 days.

## 2018-05-08 NOTE — Discharge Instructions (Signed)
Drink plenty of water to empty bladder regularly. Avoid alcohol and caffeine as these may irritate the bladder.  Complete course of antibiotics.  If symptoms worsen or do not improve in the next week to return to be seen or to follow up with your PCP.   

## 2018-07-10 ENCOUNTER — Ambulatory Visit (HOSPITAL_COMMUNITY)
Admission: EM | Admit: 2018-07-10 | Discharge: 2018-07-10 | Disposition: A | Discharge status: Home / Self Care | Payer: Medicaid Other | Attending: Family Medicine | Admitting: Family Medicine

## 2018-07-10 ENCOUNTER — Encounter (HOSPITAL_COMMUNITY): Payer: Self-pay | Admitting: Emergency Medicine

## 2018-07-10 DIAGNOSIS — J069 Acute upper respiratory infection, unspecified: Secondary | ICD-10-CM | POA: Insufficient documentation

## 2018-07-10 DIAGNOSIS — N898 Other specified noninflammatory disorders of vagina: Secondary | ICD-10-CM | POA: Diagnosis not present

## 2018-07-10 DIAGNOSIS — R102 Pelvic and perineal pain: Secondary | ICD-10-CM | POA: Diagnosis present

## 2018-07-10 LAB — POCT URINALYSIS DIP (DEVICE)
Bilirubin Urine: NEGATIVE
Glucose, UA: NEGATIVE mg/dL
Ketones, ur: NEGATIVE mg/dL
Nitrite: NEGATIVE
Protein, ur: NEGATIVE mg/dL
Specific Gravity, Urine: 1.02 (ref 1.005–1.030)
Urobilinogen, UA: 0.2 mg/dL (ref 0.0–1.0)
pH: 7 (ref 5.0–8.0)

## 2018-07-10 LAB — POCT PREGNANCY, URINE: Preg Test, Ur: NEGATIVE

## 2018-07-10 MED ORDER — METRONIDAZOLE 500 MG PO TABS: 500.0000 mg | ORAL_TABLET | Freq: Two times a day (BID) | ORAL | 0 refills | Status: AC

## 2018-07-10 NOTE — Discharge Instructions (Addendum)
We will start treatment for BV as your vaginal test is in process. I have also sent your urine to be cultured to ensure no urinary tract infection. Will notify you of any positive findings and if any changes to treatment are needed.  Continue with fluids, tylenol, ibuprofen, over the counter treatments as needed for upper respiratory symptoms.  Please follow up with gynecology as needed for pelvic discomfort and abnormal periods.  Return sooner or go to ER if develop worsening of pain, fevers, blood in urine or otherwise worsening.

## 2018-07-10 NOTE — ED Provider Notes (Signed)
[facility]    CSN: [id number] Arrival date & time: 07/10/18  1730     History   Chief Complaint Chief Complaint  Patient presents with  . Pelvic Pain    6pm    HPI Melanie Hancock is a 22 y.o. female.   Lerline presents with complaints of nasal congestion, and cough which started 1 week ago. Had body aches at the onset of symptoms, these have resolved. No further fevers. No other gi complaints. No rash. No further fevers. No shortness of breath . No chest pain . No ear pain or sore throat. Had a small cold sore to bottom lip, this is also improving. No drainage, no longer tender. Also complaints of 1 week of white vaginal discharge with odor. States that sometimes is thin and sometimes is thick and creamy. No vaginal bleeding. Had been on depo for two years, stopped in January. Periods returned last month, had a period the beginning of October as well as the end of October. Has intermittent pelvic cramping bilaterally. Denies currently. No nausea, vomiting or diarrhea. States she has slight increase in frequency of urination, no pain with urination, no blood in urine. No back pain. States has had BV in the past and this feels somewhat similar. Sexually active with one partner, doesn't use condoms. No specific known STD concern. Hx of anemia, anxiety, back pain, depression, UTI, pyelonephritis.     ROS per HPI.      Past Medical History:  Diagnosis Date  . Anemia   . Anxiety   . Chronic back pain   . Depression   . Infection    UTI  . Post partum depression   . Pyelonephritis   . UTI (lower urinary tract infection)     Patient Active Problem List   Diagnosis Date Noted  . Vaginal delivery 02/04/2016  . History of postpartum depression, currently pregnant 01/23/2016  . Iron deficiency anemia 01/02/2016  . 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 Anxiety 03/28/2014  . Chronic back pain 03/28/2014  . Hx pyelonephritis 2014 05/26/2013    Past Surgical History:    Procedure Laterality Date  . DILATION AND CURETTAGE OF UTERUS    . INDUCED ABORTION    . WISDOM TOOTH EXTRACTION      OB History    Gravida  5   Para  3   Term  3   Preterm      AB  2   Living  3     SAB  1   TAB  1   Ectopic      Multiple  0   Live Births  3            Home Medications    Prior to Admission medications   Medication Sig Start Date End Date Taking? Authorizing Provider  ibuprofen (ADVIL,MOTRIN) 600 MG tablet Take 1 tablet (600 mg total) by mouth every 6 (six) hours as needed. Patient not taking: Reported on 07/10/2018 03/14/18   Domenick Gong, MD  metroNIDAZOLE (FLAGYL) 500 MG tablet Take 1 tablet (500 mg total) by mouth 2 (two) times daily for 7 days. 07/10/18 07/17/18  Georgetta Haber, NP  omeprazole (PRILOSEC) 20 MG capsule Take 1 capsule (20 mg total) by mouth 2 (two) times daily before a meal for 15 days. 02/18/18 03/05/18  Fayrene Helper, PA-C    Family History Family History  Problem Relation Age of Onset  . Arthritis Mother   . Arthritis Maternal Grandmother   .  Asthma Son   . Alcohol abuse Neg Hx     Social History Social History   Tobacco Use  . Smoking status: Former Smoker    Last attempt to quit: 01/21/2014    Years since quitting: 4.4  . Smokeless tobacco: Never Used  Substance Use Topics  . Alcohol use: No  . Drug use: No     Allergies   Patient has no known allergies.   Review of Systems Review of Systems   Physical Exam Triage Vital Signs ED Triage Vitals  Enc Vitals Group     BP 07/10/18 1758 (!) 113/55     Pulse Rate 07/10/18 1758 70     Resp 07/10/18 1758 16     Temp 07/10/18 1758 97.7 F (36.5 C)     Temp src --      SpO2 07/10/18 1758 100 %     Weight --      Height --      Head Circumference --      Peak Flow --      Pain Score 07/10/18 1759 7     Pain Loc --      Pain Edu? --      Excl. in GC? --    No data found.  Updated Vital Signs BP (!) 113/55   Pulse 70   Temp 97.7 F  (36.5 C)   Resp 16   LMP 06/19/2018   SpO2 100%   Visual Acuity Right Eye Distance:   Left Eye Distance:   Bilateral Distance:    Right Eye Near:   Left Eye Near:    Bilateral Near:     Physical Exam  Constitutional: She is oriented to person, place, and time. She appears well-developed and well-nourished. No distress.  HENT:  Head: Normocephalic and atraumatic.  Right Ear: Tympanic membrane, external ear and ear canal normal.  Left Ear: Tympanic membrane, external ear and ear canal normal.  Nose: Nose normal.  Mouth/Throat: Uvula is midline, oropharynx is clear and moist and mucous membranes are normal. No tonsillar exudate.  Healing lesion to left lower lip; no redness or drainage; non tender   Eyes: Pupils are equal, round, and reactive to light. Conjunctivae and EOM are normal.  Cardiovascular: Normal rate, regular rhythm and normal heart sounds.  Pulmonary/Chest: Effort normal and breath sounds normal.  Abdominal: Soft. There is no tenderness. There is no rigidity, no rebound, no guarding and no CVA tenderness.  Genitourinary:  Genitourinary Comments: Denies sores, lesions, vaginal bleeding; no pelvic pain; gu exam deferred at this time, vaginal self swab collected.    Neurological: She is alert and oriented to person, place, and time.  Skin: Skin is warm and dry.     UC Treatments / Results  Labs (all labs ordered are listed, but only abnormal results are displayed) Labs Reviewed  POCT URINALYSIS DIP (DEVICE) - Abnormal; Notable for the following components:      Result Value   Hgb urine dipstick TRACE (*)    Leukocytes, UA TRACE (*)    All other components within normal limits  URINE CULTURE  POCT PREGNANCY, URINE  CERVICOVAGINAL ANCILLARY ONLY    EKG None  Radiology No results found.  Procedures Procedures (including critical care time)  Medications Ordered in UC Medications - No data to display  Initial Impression / Assessment and Plan / UC Course   I have reviewed the triage vital signs and the nursing notes.  Pertinent labs & imaging results that  were available during my care of the patient were reviewed by me and considered in my medical decision making (see chart for details).     URI symptoms consistent with viral illness. Supportive cares recommended. Urine with small leuks and hgb, minimal urinary symptoms, culture obtained. Vaginal cytology in process, flagyl initiated. Will notify of any positive findings and if any changes to treatment are needed.  Encouraged follow up with gynecology as needed for irregular periods. Return precautions provided. Patient verbalized understanding and agreeable to plan.    Final Clinical Impressions(s) / UC Diagnoses   Final diagnoses:  Viral URI  Vaginal discharge     Discharge Instructions     We will start treatment for BV as your vaginal test is in process. I have also sent your urine to be cultured to ensure no urinary tract infection. Will notify you of any positive findings and if any changes to treatment are needed.  Continue with fluids, tylenol, ibuprofen, over the counter treatments as needed for upper respiratory symptoms.  Please follow up with gynecology as needed for pelvic discomfort and abnormal periods.  Return sooner or go to ER if develop worsening of pain, fevers, blood in urine or otherwise worsening.      ED Prescriptions    Medication Sig Dispense Auth. Provider   metroNIDAZOLE (FLAGYL) 500 MG tablet Take 1 tablet (500 mg total) by mouth 2 (two) times daily for 7 days. 14 tablet Georgetta Haber, NP     Controlled Substance Prescriptions [place] Controlled Substance Registry consulted? Not Applicable   Georgetta Haber, NP 07/10/18 1844

## 2018-07-10 NOTE — ED Triage Notes (Signed)
Pt c/o cough, runny nose, but also pt c/o lower pelvic pain, pt states shes had abnormal discharge with an odor.

## 2018-07-11 LAB — URINE CULTURE: Culture: <10000 — AB

## 2018-07-11 LAB — CERVICOVAGINAL ANCILLARY ONLY
Bacterial vaginitis: POSITIVE — AB
Candida vaginitis: NEGATIVE
Chlamydia: NEGATIVE
Neisseria Gonorrhea: NEGATIVE
Trichomonas: POSITIVE — AB

## 2018-07-14 ENCOUNTER — Telehealth (HOSPITAL_COMMUNITY): Payer: Self-pay

## 2018-07-14 NOTE — Telephone Encounter (Signed)
Bacterial Vaginosis test is positive.  Prescription for metronidazole was given at the urgent care visit. Pt contacted regarding results. Answered all questions. Verbalized understanding. Trichomonas is positive. Rx  for Flagyl 2 grams, once was sent to the pharmacy of record. Pt needs education to refrain from sexual intercourse for 7 days to give the medicine time to work. Sexual partners need to be notified and tested/treated. Condoms may reduce risk of reinfection. Recheck for further evaluation if symptoms are not improving.   Attempted to call pt and LVMM

## 2018-07-16 ENCOUNTER — Telehealth (HOSPITAL_COMMUNITY): Payer: Self-pay | Admitting: Emergency Medicine

## 2018-07-16 NOTE — Telephone Encounter (Signed)
Returned patients voicemail about results. Pt concerned that she did not take the medication right in the beginning, states "I only took one pill every day for the first few days". Per Amy PA, pt to take 2g total to be sure to treat trich and to return for BV check if still symptomatic. Pt agreeable to plan, all questions answered.

## 2018-07-31 ENCOUNTER — Encounter (HOSPITAL_COMMUNITY): Payer: Self-pay

## 2018-07-31 ENCOUNTER — Ambulatory Visit (HOSPITAL_COMMUNITY)
Admission: EM | Admit: 2018-07-31 | Discharge: 2018-07-31 | Disposition: A | Discharge status: Home / Self Care | Payer: Medicaid Other | Attending: Emergency Medicine | Admitting: Emergency Medicine

## 2018-07-31 ENCOUNTER — Other Ambulatory Visit: Payer: Self-pay

## 2018-07-31 DIAGNOSIS — A599 Trichomoniasis, unspecified: Secondary | ICD-10-CM | POA: Diagnosis present

## 2018-07-31 DIAGNOSIS — Z87891 Personal history of nicotine dependence: Secondary | ICD-10-CM | POA: Diagnosis not present

## 2018-07-31 DIAGNOSIS — R102 Pelvic and perineal pain: Secondary | ICD-10-CM | POA: Insufficient documentation

## 2018-07-31 DIAGNOSIS — N898 Other specified noninflammatory disorders of vagina: Secondary | ICD-10-CM | POA: Insufficient documentation

## 2018-07-31 LAB — POCT PREGNANCY, URINE: Preg Test, Ur: NEGATIVE

## 2018-07-31 MED ORDER — FLUCONAZOLE 150 MG PO TABS: 150.0000 mg | ORAL_TABLET | Freq: Once | ORAL | 1 refills | Status: AC

## 2018-07-31 NOTE — ED Triage Notes (Signed)
Pt cc she's here for a follow visit from 2 weeks ago for BV and  Trichomas . Pt is having some pelvis pain.

## 2018-07-31 NOTE — ED Provider Notes (Signed)
HPI  SUBJECTIVE:  Melanie Hancock is a 22 y.o. female who presents for follow-up after treatment of trichomonas and BV. Patient seen here 3 weeks ago for vaginal odorous discharge.  Rx'd Flagyl, which she did not take as prescribed, she was found to be positive for trichomoniasis and BV.  Gonorrhea and Chlamydia were negative.  She states that she took 2 g of Flagyl, and that her sexual partner took 2 g of Flagyl as well.  She reports continued white, thin nonodorous vaginal discharge.  She states that the vaginal discharge has changed since she was evaluated.  She reports vaginal irritation which is new.  She reports intermittent, stabbing, seconds long back, pelvic pain that was present on her first visit.  She states that this is getting better overall.  No urinary symptoms, vaginal itching, rash, labial swelling.  No abdominal pain.  No vomiting, fevers.  No aggravating or alleviating factors.  She has not tried anything for this.  She has 1 sexual partner, female, who was treated for trichomonas.  States that they have had intercourse since being treated, she denies dyspareunia.  They did not use condoms.  Past medical history of trichomonas, BV, yeast infections, UTIs.  No history of PID, ectopic pregnancy, gonorrhea, chlamydia, HIV, HSV, syphilis, diabetes, hypertension.  LMP: End of November.  Would like for us to check a upreg.  PMD: [facility].  Past Medical History:  Diagnosis Date  . Anemia   . Anxiety   . Chronic back pain   . Depression   . Infection    UTI  . Post partum depression   . Pyelonephritis   . UTI (lower urinary tract infection)     Past Surgical History:  Procedure Laterality Date  . DILATION AND CURETTAGE OF UTERUS    . INDUCED ABORTION    . WISDOM TOOTH EXTRACTION      Family History  Problem Relation Age of Onset  . Arthritis Mother   . Arthritis Maternal Grandmother   . Asthma Son   . Alcohol abuse Neg Hx     Social History   Tobacco Use   . Smoking status: Former Smoker    Last attempt to quit: 01/21/2014    Years since quitting: 4.5  . Smokeless tobacco: Never Used  Substance Use Topics  . Alcohol use: No  . Drug use: No    No current facility-administered medications for this encounter.  No current outpatient medications on file.  No Known Allergies   ROS  As noted in HPI.   Physical Exam  BP (!) 107/52 (BP Location: Left Arm)   Pulse 64   Temp 98 F (36.7 C) (Oral)   Resp 18   Wt 76.7 kg   SpO2 100%   BMI 28.12 kg/m    Constitutional: Well developed, well nourished, no acute distress Eyes:  EOMI, conjunctiva normal bilaterally HENT: Normocephalic, atraumatic,mucus membranes moist Respiratory: Normal inspiratory effort Cardiovascular: Normal rate GI: nondistended.  Positive very mild suprapubic tenderness.  Mild left lower quadrant tenderness.  No guarding, rebound.  No masses.  No flank tenderness. GU: Normal external genitalia.  No rashes.  No erythema.  Skin intact.  Normal vaginal mucosa.  Normal eyes.  Scant non odorous vaginal discharge.Marland Kitchen.  Uterus smooth, mildly tender.  No CMT, no adnexal tenderness or masses.  Chaperone present during exam. Back: No CVAT. skin: No rash, skin intact Musculoskeletal: no deformities Neurologic: Alert & oriented x 3, no focal neuro deficits Psychiatric: Speech  and behavior appropriate   ED Course   Medications - No data to display  Orders Placed This Encounter  Procedures  . POC urine pregnancy    Standing Status:   Standing    Number of Occurrences:   1  . Pregnancy, urine POC    Standing Status:   Standing    Number of Occurrences:   1    Results for orders placed or performed during the hospital encounter of 07/31/18 (from the past 24 hour(s))  Pregnancy, urine POC     Status: None   Collection Time: 07/31/18  7:09 PM  Result Value Ref Range   Preg Test, Ur NEGATIVE NEGATIVE   No results found.  ED Clinical Impression  Vaginal  discharge   ED Assessment/Plan  previous labs and note reviewed.  As noted in HPI.  Doing Pelvic exam today because she is reporting pelvic and back pain.  home with Diflucan as I think that the likelihood of patient having a yeast infection with vaginal irritation and recent antibiotics is fairly likely.  No evidence of PID.  Awaiting BV, trichomonas results.  Will base further treatment off of this.  States that her sexual partner was also treated for trichomonas.  No evidence of herpes.  She tested negative for gonorrhea and chlamydia last time, has not been with anybody else, so this was not repeated.   She has no urinary symptoms, so UA was not done.  Urine pregnancy negative. She will follow-up with [facility].  Discussed labs,  MDM, treatment plan, and plan for follow-up with patient. patient agrees with plan.   Meds ordered this encounter  Medications  . fluconazole (DIFLUCAN) 150 MG tablet    Sig: Take 1 tablet (150 mg total) by mouth once for 1 dose. 1 tab po x 1. May repeat in 72 hours if no improvement    Dispense:  2 tablet    Refill:  1    *This clinic note was created using Scientist, clinical (histocompatibility and immunogenetics). Therefore, there may be occasional mistakes despite careful proofreading.   ?   Domenick Gong, MD 08/01/18 1440

## 2018-07-31 NOTE — Discharge Instructions (Addendum)
Take the medication as written. Give us a working phone number so that we can contact you if needed. Refrain from sexual contact until you know your results and your partner(s) are treated if necessary.  You can call here in several days to get your results if you do not hear from us.  Go to www.goodrx.com to look up your medications. This will give you a list of where you can find your prescriptions at the most affordable prices. Or ask the pharmacist what the cash price is, or if they have any other discount programs available to help make your medication more affordable. This can be less expensive than what you would pay with insurance.

## 2018-08-01 LAB — CERVICOVAGINAL ANCILLARY ONLY
Bacterial vaginitis: POSITIVE — AB
Candida vaginitis: POSITIVE — AB
Trichomonas: NEGATIVE

## 2018-08-03 ENCOUNTER — Telehealth: Payer: Self-pay | Admitting: Emergency Medicine

## 2018-08-03 MED ORDER — METRONIDAZOLE 500 MG PO TABS: 500.0000 mg | ORAL_TABLET | Freq: Two times a day (BID) | ORAL | 0 refills | Status: AC

## 2018-08-03 NOTE — Telephone Encounter (Signed)
Pt contacted about test results, will pick up flagyl today. All questions answered.

## 2018-08-03 NOTE — Telephone Encounter (Signed)
E prescribed Flagyl 500 mg p.o. twice daily #14 to patient's pharmacy of choice AM

## 2018-08-25 ENCOUNTER — Encounter (HOSPITAL_COMMUNITY): Payer: Self-pay | Admitting: Emergency Medicine

## 2018-08-25 ENCOUNTER — Ambulatory Visit (HOSPITAL_COMMUNITY)
Admission: EM | Admit: 2018-08-25 | Discharge: 2018-08-25 | Disposition: A | Discharge status: Home / Self Care | Payer: Medicaid Other | Attending: Internal Medicine | Admitting: Internal Medicine

## 2018-08-25 ENCOUNTER — Other Ambulatory Visit: Payer: Self-pay

## 2018-08-25 DIAGNOSIS — N939 Abnormal uterine and vaginal bleeding, unspecified: Secondary | ICD-10-CM | POA: Insufficient documentation

## 2018-08-25 DIAGNOSIS — Z3202 Encounter for pregnancy test, result negative: Secondary | ICD-10-CM | POA: Diagnosis not present

## 2018-08-25 LAB — POCT I-STAT, CHEM 8
BUN: 10 mg/dL (ref 6–20)
Calcium, Ion: 1.26 mmol/L (ref 1.15–1.40)
Chloride: 103 mmol/L (ref 98–111)
Creatinine, Ser: 0.8 mg/dL (ref 0.44–1.00)
Glucose, Bld: 83 mg/dL (ref 70–99)
HCT: 42 % (ref 36.0–46.0)
Hemoglobin: 14.3 g/dL (ref 12.0–15.0)
Potassium: 4.1 mmol/L (ref 3.5–5.1)
Sodium: 138 mmol/L (ref 135–145)
TCO2: 26 mmol/L (ref 22–32)

## 2018-08-25 LAB — POCT URINALYSIS DIP (DEVICE)
Bilirubin Urine: NEGATIVE
Glucose, UA: NEGATIVE mg/dL
Ketones, ur: NEGATIVE mg/dL
Leukocytes, UA: NEGATIVE
Nitrite: NEGATIVE
Protein, ur: NEGATIVE mg/dL
Specific Gravity, Urine: 1.025 (ref 1.005–1.030)
Urobilinogen, UA: 0.2 mg/dL (ref 0.0–1.0)
pH: 7 (ref 5.0–8.0)

## 2018-08-25 LAB — POCT PREGNANCY, URINE: Preg Test, Ur: NEGATIVE

## 2018-08-25 MED ORDER — MEGESTROL ACETATE 40 MG PO TABS: 40.0000 mg | ORAL_TABLET | Freq: Every day | ORAL | 0 refills | Status: DC

## 2018-08-25 NOTE — Discharge Instructions (Signed)
Please begin megace twice daily until bleeding stops Use anti-inflammatories for pain/swelling. You may take up to 800 mg Ibuprofen every 8 hours with food. You may supplement Ibuprofen with Tylenol [phone] mg every 8 hours.   Follow up with women's clinic for further evaluation of bleeding Return if bleeding worsening, or developing increased pain

## 2018-08-25 NOTE — ED Notes (Signed)
Sent to bathroom for specimen 

## 2018-08-25 NOTE — ED Triage Notes (Signed)
Denies pain.  Has had vaginal bleeding/spotting since dec 26.

## 2018-08-26 LAB — CERVICOVAGINAL ANCILLARY ONLY
Bacterial vaginitis: POSITIVE — AB
Candida vaginitis: NEGATIVE
Chlamydia: NEGATIVE
Neisseria Gonorrhea: NEGATIVE
Trichomonas: NEGATIVE

## 2018-08-26 NOTE — ED Provider Notes (Signed)
[facility]    CSN: [id number] Arrival date & time: 08/25/18  1704     History   Chief Complaint Chief Complaint  Patient presents with  . Vaginal Bleeding  . Appointment    5:00pm    HPI Melanie Hancock is a 23 y.o. female history of iron deficiency anemia, presenting today for evaluation of vaginal bleeding and spotting.  Patient states that around Christmas she started her cycle which is a few days late.  States that her cycles never a full heavy cycle but she has had persistent spotting and bleeding for the past 2 weeks.  Symptoms improved, but had intercourse recently which triggered the bleeding again.  States that it has never been heavy and has not filled a full pad.  Patient states that prior to this she stopped using Depo-Provera for birth control back in January.  She did not return to menses until October when she had 2 cycles in October.  November she had a normal cycle.  And then this current episode of bleeding now.  She states that she was on Depakote for approximately 2 and half years.  Prior to this she had had normal cycles that were regular.  She denies any abdominal pain.  Denies any discharge.  States that she recently was tested for STDs, positive for trichomonas and was treated, had follow-up and tested positive for yeast and BV and was treated.  She denies any nausea vomiting.  HPI  Past Medical History:  Diagnosis Date  . Anemia   . Anxiety   . Chronic back pain   . Depression   . Infection    UTI  . Post partum depression   . Pyelonephritis   . UTI (lower urinary tract infection)     Patient Active Problem List   Diagnosis Date Noted  . Vaginal delivery 02/04/2016  . History of postpartum depression, currently pregnant 01/23/2016  . Iron deficiency anemia 01/02/2016  . 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 Anxiety 03/28/2014  . Chronic back pain 03/28/2014  . Hx pyelonephritis 2014 05/26/2013    Past Surgical History:  Procedure Laterality  Date  . DILATION AND CURETTAGE OF UTERUS    . INDUCED ABORTION    . WISDOM TOOTH EXTRACTION      OB History    Gravida  5   Para  3   Term  3   Preterm      AB  2   Living  3     SAB  1   TAB  1   Ectopic      Multiple  0   Live Births  3            Home Medications    Prior to Admission medications   Medication Sig Start Date End Date Taking? Authorizing Provider  megestrol (MEGACE) 40 MG tablet Take 1 tablet (40 mg total) by mouth daily. 08/25/18   Wieters, Junius CreamerHallie C, PA-C    Family History Family History  Problem Relation Age of Onset  . Arthritis Mother   . Arthritis Maternal Grandmother   . Asthma Son   . Alcohol abuse Neg Hx     Social History Social History   Tobacco Use  . Smoking status: Former Smoker    Last attempt to quit: 01/21/2014    Years since quitting: 4.5  . Smokeless tobacco: Never Used  Substance Use Topics  . Alcohol use: No  . Drug use: No  Allergies   Patient has no known allergies.   Review of Systems Review of Systems  Constitutional: Negative for fever.  Respiratory: Negative for shortness of breath.   Cardiovascular: Negative for chest pain.  Gastrointestinal: Negative for abdominal pain, diarrhea, nausea and vomiting.  Genitourinary: Positive for menstrual problem and vaginal bleeding. Negative for dysuria, flank pain, genital sores, hematuria, vaginal discharge and vaginal pain.  Musculoskeletal: Negative for back pain.  Skin: Negative for rash.  Neurological: Negative for dizziness, light-headedness and headaches.     Physical Exam Triage Vital Signs ED Triage Vitals  Enc Vitals Group     BP 08/25/18 1717 121/66     Pulse Rate 08/25/18 1717 70     Resp --      Temp 08/25/18 1717 98.2 F (36.8 C)     Temp Source 08/25/18 1717 Oral     SpO2 08/25/18 1717 98 %     Weight --      Height --      Head Circumference --      Peak Flow --      Pain Score 08/25/18 1715 0     Pain Loc --      Pain  Edu? --      Excl. in GC? --    No data found.  Updated Vital Signs BP 121/66 (BP Location: Right Arm)   Pulse 70   Temp 98.2 F (36.8 C) (Oral)   SpO2 98%   Visual Acuity Right Eye Distance:   Left Eye Distance:   Bilateral Distance:    Right Eye Near:   Left Eye Near:    Bilateral Near:     Physical Exam Vitals signs and nursing note reviewed.  Constitutional:      General: She is not in acute distress.    Appearance: She is well-developed.  HENT:     Head: Normocephalic and atraumatic.  Eyes:     Conjunctiva/sclera: Conjunctivae normal.  Neck:     Musculoskeletal: Neck supple.  Cardiovascular:     Rate and Rhythm: Normal rate and regular rhythm.     Heart sounds: No murmur.  Pulmonary:     Effort: Pulmonary effort is normal. No respiratory distress.     Breath sounds: Normal breath sounds.  Abdominal:     Palpations: Abdomen is soft.     Tenderness: There is no abdominal tenderness.     Comments: Nontender to light deep palpation throughout abdomen  Genitourinary:    Comments: Normal female genitalia, mild amount of bright red blood in vagina, cervix nonerythematous, blood seen exiting os  No cervical motion tenderness, no adnexal masses or tenderness palpated Skin:    General: Skin is warm and dry.  Neurological:     Mental Status: She is alert.      UC Treatments / Results  Labs (all labs ordered are listed, but only abnormal results are displayed) Labs Reviewed  POCT URINALYSIS DIP (DEVICE) - Abnormal; Notable for the following components:      Result Value   Hgb urine dipstick MODERATE (*)    All other components within normal limits  POCT PREGNANCY, URINE  I-STAT CHEM 8, ED  POCT I-STAT, CHEM 8  CERVICOVAGINAL ANCILLARY ONLY    EKG None  Radiology No results found.  Procedures Procedures (including critical care time)  Medications Ordered in UC Medications - No data to display  Initial Impression / Assessment and Plan / UC Course   I have reviewed the triage vital signs  and the nursing notes.  Pertinent labs & imaging results that were available during my care of the patient were reviewed by me and considered in my medical decision making (see chart for details).     I-STAT obtained given history of anemia and patient's concern.  Hemoglobin stable at 14.8.  Pregnancy test negative.  UA negative, besides hemoglobin.  Rechecking vaginal swab as patient has had a new partner.  Will provide Megace twice daily until bleeding stops.  Follow-up with OB/GYN for further evaluation of abnormal bleeding as well as further discussion of birth control.  Continue to monitor symptoms, in the interim follow-up here/ED if symptoms worsening, developing increased pain. Discussed strict return precautions. Patient verbalized understanding and is agreeable with plan.  Final Clinical Impressions(s) / UC Diagnoses   Final diagnoses:  Vaginal bleeding     Discharge Instructions     Please begin megace twice daily until bleeding stops Use anti-inflammatories for pain/swelling. You may take up to 800 mg Ibuprofen every 8 hours with food. You may supplement Ibuprofen with Tylenol [phone] mg every 8 hours.   Follow up with women's clinic for further evaluation of bleeding Return if bleeding worsening, or developing increased pain   ED Prescriptions    Medication Sig Dispense Auth. Provider   megestrol (MEGACE) 40 MG tablet Take 1 tablet (40 mg total) by mouth daily. 16 tablet Wieters, [place] C, PA-C     Controlled Substance Prescriptions [place] Controlled Substance Registry consulted? Not Applicable   Lew Dawes, [place] 08/26/18 [phone]

## 2018-08-27 ENCOUNTER — Telehealth (HOSPITAL_COMMUNITY): Payer: Self-pay | Admitting: Emergency Medicine

## 2018-08-27 MED ORDER — METRONIDAZOLE 500 MG PO TABS: 500.0000 mg | ORAL_TABLET | Freq: Two times a day (BID) | ORAL | 0 refills | Status: DC

## 2018-09-03 ENCOUNTER — Encounter (HOSPITAL_COMMUNITY): Payer: Self-pay

## 2018-09-03 ENCOUNTER — Ambulatory Visit (HOSPITAL_COMMUNITY)
Admission: EM | Admit: 2018-09-03 | Discharge: 2018-09-03 | Disposition: A | Discharge status: Home / Self Care | Payer: Medicaid Other | Attending: Family Medicine | Admitting: Family Medicine

## 2018-09-03 DIAGNOSIS — R059 Cough, unspecified: Secondary | ICD-10-CM

## 2018-09-03 DIAGNOSIS — R05 Cough: Secondary | ICD-10-CM | POA: Insufficient documentation

## 2018-09-03 DIAGNOSIS — J111 Influenza due to unidentified influenza virus with other respiratory manifestations: Secondary | ICD-10-CM

## 2018-09-03 DIAGNOSIS — R69 Illness, unspecified: Secondary | ICD-10-CM | POA: Diagnosis not present

## 2018-09-03 DIAGNOSIS — R110 Nausea: Secondary | ICD-10-CM

## 2018-09-03 LAB — POCT PREGNANCY, URINE: Preg Test, Ur: NEGATIVE

## 2018-09-03 MED ORDER — HYDROCODONE-HOMATROPINE 5-1.5 MG/5ML PO SYRP: 5.0000 mL | ORAL_SOLUTION | Freq: Four times a day (QID) | ORAL | 0 refills | Status: DC | PRN

## 2018-09-03 MED ORDER — ONDANSETRON 4 MG PO TBDP: 4.0000 mg | ORAL_TABLET | Freq: Three times a day (TID) | ORAL | 0 refills | Status: DC | PRN

## 2018-09-03 NOTE — ED Triage Notes (Signed)
Pt presents with flu like symptoms; congestion, chills, vomiting, cough, and generalized body aches X 3 days.

## 2018-09-03 NOTE — Discharge Instructions (Signed)

## 2018-09-03 NOTE — ED Provider Notes (Signed)
[facility]   [id number] 09/03/18 Arrival Time: 1259  ASSESSMENT & PLAN:  1. Influenza-like illness   2. Nausea without vomiting   3. Cough    See AVS for discharge nstructions.  Meds ordered this encounter  Medications  . HYDROcodone-homatropine (HYCODAN) 5-1.5 MG/5ML syrup    Sig: Take 5 mLs by mouth every 6 (six) hours as needed for cough.    Dispense:  90 mL    Refill:  0  . ondansetron (ZOFRAN-ODT) 4 MG disintegrating tablet    Sig: Take 1 tablet (4 mg total) by mouth every 8 (eight) hours as needed for nausea or vomiting.    Dispense:  15 tablet    Refill:  0   Cough medication sedation precautions. Discussed typical duration of symptoms. OTC symptom care as needed. Ensure adequate fluid intake and rest. May f/u with PCP or here as needed.  Informed her of negative UPT.  Reviewed expectations re: course of current medical issues. Questions answered. Outlined signs and symptoms indicating need for more acute intervention. Patient verbalized understanding. After Visit Summary given.   SUBJECTIVE: History from: patient.  Melanie Hancock is a 23 y.o. female who presents with complaint of nasal congestion, post-nasal drainage, and a persistent dry cough; without sore throat. Onset abrupt, about three days ago; with fatigue and with mild body aches. SOB: none. Wheezing: none. Fever: questions subjective. Overall decreased PO intake secondary to mild nausea and post-tussive emesis. Known sick contacts: none. No specific or significant aggravating or alleviating factors reported. OTC treatment: none reported.  Received flu shot this year: no.  No LMP recorded. (Menstrual status: Irregular Periods). Requests pregnancy test. No abdominal or pelvic pain. No vaginal discharge. No urinary symptoms.  Social History   Tobacco Use  Smoking Status Former Smoker  . Last attempt to quit: 01/21/2014  . Years since quitting: 4.6  Smokeless Tobacco Never Used   ROS:  As per HPI. All other systems negative   OBJECTIVE:  Vitals:   09/03/18 1331  BP: 129/68  Pulse: 80  Resp: 20  Temp: 98.8 F (37.1 C)  TempSrc: Oral  SpO2: 100%    General appearance: alert; appears fatigued HEENT: nasal congestion; clear runny nose; throat irritation secondary to post-nasal drainage Neck: supple without LAD CV: RRR Lungs: unlabored respirations, symmetrical air entry without wheezing; cough: moderate Abd: soft; non-distended; non-tender Ext: no LE edema Skin: warm and dry Psychological: alert and cooperative; normal mood and affect   No Known Allergies  Past Medical History:  Diagnosis Date  . Anemia   . Anxiety   . Chronic back pain   . Depression   . Infection    UTI  . Post partum depression   . Pyelonephritis   . UTI (lower urinary tract infection)    Family History  Problem Relation Age of Onset  . Arthritis Mother   . Arthritis Maternal Grandmother   . Asthma Son   . Alcohol abuse Neg Hx    Social History   Socioeconomic History  . Marital status: Single    Spouse name: Not on file  . Number of children: Not on file  . Years of education: Not on file  . Highest education level: Not on file  Occupational History  . Not on file  Social Needs  . Financial resource strain: Not on file  . Food insecurity:    Worry: Not on file    Inability: Not on file  . Transportation needs:  Medical: Not on file    Non-medical: Not on file  Tobacco Use  . Smoking status: Former Smoker    Last attempt to quit: 01/21/2014    Years since quitting: 4.6  . Smokeless tobacco: Never Used  Substance and Sexual Activity  . Alcohol use: No  . Drug use: No  . Sexual activity: Yes  Lifestyle  . Physical activity:    Days per week: Not on file    Minutes per session: Not on file  . Stress: Not on file  Relationships  . Social connections:    Talks on phone: Not on file    Gets together: Not on file    Attends religious service: Not on file     Active member of club or organization: Not on file    Attends meetings of clubs or organizations: Not on file    Relationship status: Not on file  . Intimate partner violence:    Fear of current or ex partner: Not on file    Emotionally abused: Not on file    Physically abused: Not on file    Forced sexual activity: Not on file  Other Topics Concern  . Not on file  Social History Narrative  . Not on file           Mardella Layman, MD 09/03/18 1433

## 2018-09-23 ENCOUNTER — Other Ambulatory Visit (HOSPITAL_COMMUNITY)
Admission: RE | Admit: 2018-09-23 | Discharge: 2018-09-23 | Disposition: A | Discharge status: Home / Self Care | Payer: Medicaid Other | Source: Ambulatory Visit | Attending: Family Medicine | Admitting: Family Medicine

## 2018-09-23 ENCOUNTER — Ambulatory Visit (INDEPENDENT_AMBULATORY_CARE_PROVIDER_SITE_OTHER): Payer: Medicaid Other | Admitting: Family Medicine

## 2018-09-23 ENCOUNTER — Encounter: Payer: Self-pay | Admitting: Family Medicine

## 2018-09-23 VITALS — BP 99/56 | HR 63 | Ht 65.5 in | Wt 162.0 lb

## 2018-09-23 DIAGNOSIS — Z01419 Encounter for gynecological examination (general) (routine) without abnormal findings: Secondary | ICD-10-CM | POA: Diagnosis present

## 2018-09-23 DIAGNOSIS — N939 Abnormal uterine and vaginal bleeding, unspecified: Secondary | ICD-10-CM

## 2018-09-23 DIAGNOSIS — R634 Abnormal weight loss: Secondary | ICD-10-CM

## 2018-09-23 NOTE — Patient Instructions (Signed)

## 2018-09-23 NOTE — Progress Notes (Signed)
GYNECOLOGY OFFICE VISIT NOTE History:  23 y.o. [phone] here today for abnormal uterine bleeding.   - last depo shot January 2019 - was on for two years, thinking about having another child  - no bleeding until October 2019 - 4-day cycles twice, normal flow - 11/19 - 5 day cycle normal - 12/19 - 14d of spotting, started 2-3 days late, just spotting, no heavy period  - 1/22-1/30 - spotting, bleeding - not normal period, just super super light, wears a pad, sometimes skips a day - took Megace for four days then bleeding stopped so stopped taking it  - last night had spotting after having sex - last two weeks, light pink to brown, sometimes lower cramping with sex   - [facility] June 2017  - weight loss - 20 to 30lbs in last 1-2 years - new boyfriend, trying to find job as MA - not super active usually, drinking more water than usual, more fruits  - some fatigue, vomiting in December for 2 weeks but now gone - thought maybe pregnant - some cold sweats but not as much anymore - history of blood transfusions in the past with heavy periods (2013, 2016) - iron levels got low   The following portions of the patient's history were reviewed and updated as appropriate: allergies, current medications, past family history, past medical history, past social history, past surgical history and problem list.   Health Maintenance:  Unsure of last pap, thinks maybe 2-3 years ago. Had at [facility].  Review of Systems:  Pertinent items noted in HPI Review of Systems  Constitutional: Positive for malaise/fatigue and weight loss. Negative for chills, diaphoresis and fever.  Eyes: Negative for blurred vision.  Respiratory: Negative for cough and shortness of breath.   Cardiovascular: Negative for chest pain, palpitations and leg swelling.  Gastrointestinal: Negative for abdominal pain, nausea and vomiting.  Genitourinary: Negative for dysuria and frequency.       Occasional pelvic pain  Musculoskeletal: Negative for  joint pain and myalgias.  Skin: Negative for rash.  Neurological: Negative for dizziness and headaches.  Psychiatric/Behavioral: Negative for depression. The patient is not nervous/anxious.    Objective:  Physical Exam BP (!) 99/56   Pulse 63   Ht 5' 5.5" (1.664 m)   Wt 162 lb (73.5 kg)   LMP 09/10/2018 (Approximate)   BMI 26.55 kg/m  Physical Exam  Constitutional: She is oriented to person, place, and time. She appears well-developed and well-nourished. No distress.  HENT:  Head: Normocephalic and atraumatic.  Eyes: Conjunctivae and EOM are normal. No scleral icterus.  Cardiovascular: Normal rate.  Pulmonary/Chest: No respiratory distress.  Abdominal: There is abdominal tenderness (suprapubic).  Genitourinary:    Genitourinary Comments: Normal appearing external genitalia  normal appearing vaginal mucosa  moderate amount of thin, clear discharge, normal-appearing cervix without lesions  no CMT, some suprapubic and RLQ TTP, no ovarian fullness   Musculoskeletal:        General: No edema.  Neurological: She is alert and oriented to person, place, and time.  Psychiatric: She has a normal mood and affect. Her behavior is normal.  Nursing note and vitals reviewed.  Labs and Imaging No results found for this or any previous visit (from the past 168 hour(s)). No results found.  Assessment & Plan:  64QH [id number] who presents for [facility] and also has complaints of abnormal uterine bleeding and weight loss.   Women's annual routine gynecological examination -- Pap smear collected with G/C, also HIV and  RPR  Weight Loss: Has lost about 20 lbs in last 2 years. No changes in activity level, stopped having as much fruit juice and stopped depo about a year ago. Most likely related to those two things but will pursue work-up.  -- TSH, CBC, CMP, A1C  Abnormal Uterine Bleeding  History of Heavy Periods, Ovarian Cyst: Unclear etiology but most likely related to hormonal transition off of  Depo. Not interested in cycle regulation at this time because hoping for additional pregnancy.  -- counseled on PNV -- TSH and CBC as above as well as prolactin -- pelvic U/S to assess for anatomic abnormalities   Total face-to-face time with patient: 30 minutes.  Over 50% of encounter was spent on counseling and coordination of care.  Cristal DeerLaurel S. Earlene PlaterWallace, DO OB Family Medicine Fellow, [facility] for [facility], [facility]

## 2018-09-24 LAB — HEMOGLOBIN A1C
Est. average glucose Bld gHb Est-mCnc: 97 mg/dL
Hgb A1c MFr Bld: 5 % (ref 4.8–5.6)

## 2018-09-24 LAB — COMPREHENSIVE METABOLIC PANEL
ALT: 19 IU/L (ref 0–32)
AST: 15 IU/L (ref 0–40)
Albumin/Globulin Ratio: 1.5 (ref 1.2–2.2)
Albumin: 4.4 g/dL (ref 3.9–5.0)
Alkaline Phosphatase: 67 IU/L (ref 39–117)
BUN/Creatinine Ratio: 10 (ref 9–23)
BUN: 9 mg/dL (ref 6–20)
Bilirubin Total: 0.7 mg/dL (ref 0.0–1.2)
CO2: 21 mmol/L (ref 20–29)
Calcium: 9.5 mg/dL (ref 8.7–10.2)
Chloride: 105 mmol/L (ref 96–106)
Creatinine, Ser: 0.86 mg/dL (ref 0.57–1.00)
GFR calc Af Amer: 109 mL/min/{1.73_m2} (ref >59)
GFR calc non Af Amer: 94 mL/min/{1.73_m2} (ref >59)
Globulin, Total: 2.9 g/dL (ref 1.5–4.5)
Glucose: 64 mg/dL — ABNORMAL LOW (ref 65–99)
Potassium: 3.9 mmol/L (ref 3.5–5.2)
Sodium: 142 mmol/L (ref 134–144)
Total Protein: 7.3 g/dL (ref 6.0–8.5)

## 2018-09-24 LAB — PROLACTIN: Prolactin: 7.9 ng/mL (ref 4.8–23.3)

## 2018-09-24 LAB — CBC
Hematocrit: 36.5 % (ref 34.0–46.6)
Hemoglobin: 12.7 g/dL (ref 11.1–15.9)
MCH: 30 pg (ref 26.6–33.0)
MCHC: 34.8 g/dL (ref 31.5–35.7)
MCV: 86 fL (ref 79–97)
Platelets: 281 10*3/uL (ref 150–450)
RBC: 4.23 x10E6/uL (ref 3.77–5.28)
RDW: 12.2 % (ref 11.7–15.4)
WBC: 3.4 10*3/uL (ref 3.4–10.8)

## 2018-09-24 LAB — HIV ANTIBODY (ROUTINE TESTING W REFLEX): HIV Screen 4th Generation wRfx: NONREACTIVE

## 2018-09-24 LAB — RPR: RPR Ser Ql: NONREACTIVE

## 2018-09-24 LAB — BETA HCG QUANT (REF LAB): hCG Quant: <1 m[IU]/mL

## 2018-09-24 LAB — TSH: TSH: 0.731 u[IU]/mL (ref 0.450–4.500)

## 2018-09-25 LAB — CYTOLOGY - PAP
Chlamydia: NEGATIVE
Diagnosis: NEGATIVE
Neisseria Gonorrhea: NEGATIVE

## 2018-09-26 ENCOUNTER — Ambulatory Visit (HOSPITAL_COMMUNITY): Payer: Medicaid Other

## 2018-09-30 ENCOUNTER — Ambulatory Visit (HOSPITAL_COMMUNITY)
Admission: RE | Admit: 2018-09-30 | Discharge: 2018-09-30 | Disposition: A | Discharge status: Home / Self Care | Payer: Medicaid Other | Source: Ambulatory Visit | Attending: Family Medicine | Admitting: Family Medicine

## 2018-09-30 DIAGNOSIS — N939 Abnormal uterine and vaginal bleeding, unspecified: Secondary | ICD-10-CM | POA: Insufficient documentation

## 2018-10-02 ENCOUNTER — Encounter: Payer: Self-pay | Admitting: *Deleted

## 2018-10-03 ENCOUNTER — Ambulatory Visit (INDEPENDENT_AMBULATORY_CARE_PROVIDER_SITE_OTHER): Payer: Medicaid Other | Admitting: Family Medicine

## 2018-10-03 ENCOUNTER — Encounter: Payer: Self-pay | Admitting: Family Medicine

## 2018-10-03 VITALS — BP 104/63 | HR 51 | Ht 65.5 in | Wt 162.8 lb

## 2018-10-03 DIAGNOSIS — N939 Abnormal uterine and vaginal bleeding, unspecified: Secondary | ICD-10-CM | POA: Diagnosis not present

## 2018-10-03 NOTE — Progress Notes (Signed)
Pt states has been having abdomen cramping since yesterday

## 2018-10-03 NOTE — Progress Notes (Signed)
GYNECOLOGY OFFICE VISIT NOTE History:  23 y.o. [id number] here today for follow-up of abnormal uterine bleeding and weight loss. She denies any abnormal vaginal discharge, bleeding, pelvic pain or other concerns. Seen about 2 weeks ago for same. Doing well since last visit, still occasional spotting but nothing heavy. Still interested in pregnancy.    The following portions of the patient's history were reviewed and updated as appropriate: allergies, current medications, past family history, past medical history, past social history, past surgical history and problem list.   Review of Systems:  Pertinent items noted in HPI Review of Systems  Constitutional: Negative for malaise/fatigue.  Respiratory: Negative for shortness of breath.   Cardiovascular: Negative for chest pain.  Gastrointestinal: Negative for constipation, diarrhea, nausea and vomiting.  Genitourinary: Negative for dysuria.  Musculoskeletal: Negative for joint pain and myalgias.  Neurological: Negative for headaches.  Psychiatric/Behavioral: The patient does not have insomnia.    Objective:  Physical Exam BP 104/63   Pulse (!) 51   Ht 5' 5.5" (1.664 m)   Wt 162 lb 12.8 oz (73.8 kg)   LMP 09/10/2018 (Approximate)   BMI 26.68 kg/m  Physical Exam  Constitutional: She is oriented to person, place, and time. She appears well-developed and well-nourished. No distress.  HENT:  Head: Normocephalic and atraumatic.  Mouth/Throat: No oropharyngeal exudate.  Eyes: Conjunctivae and EOM are normal. No scleral icterus.  Neck: Neck supple.  Cardiovascular: Normal rate and regular rhythm.  No murmur heard. Pulmonary/Chest: Effort normal and breath sounds normal. No respiratory distress.  Genitourinary:    Genitourinary Comments: deferred   Lymphadenopathy:    She has no cervical adenopathy.  Neurological: She is alert and oriented to person, place, and time.  Psychiatric: She has a normal mood and affect. Her behavior is  normal.  Nursing note and vitals reviewed.  Labs and Imaging No results found for this or any previous visit (from the past 168 hour(s)). US Pelvic Complete With Transvaginal  Result Date: 09/30/2018 CLINICAL DATA:  Abnormal uterine bleeding, heavy menses, history of ovarian cyst EXAM: TRANSABDOMINAL AND TRANSVAGINAL ULTRASOUND OF PELVIS TECHNIQUE: Both transabdominal and transvaginal ultrasound examinations of the pelvis were performed. Transabdominal technique was performed for global imaging of the pelvis including uterus, ovaries, adnexal regions, and pelvic cul-de-sac. It was necessary to proceed with endovaginal exam following the transabdominal exam to visualize the endometrial complex. COMPARISON:  06/12/2016 FINDINGS: Uterus Measurements: 9.4 x 4.7 x 6.1 cm = volume: 139 mL. Anteflexed. Normal morphology without mass. Prominent arcuate vessels incidentally noted. Endometrium Thickness: 2 mm diameter, normal. No endometrial fluid or focal abnormality Right ovary Measurements: 4.0 x 2.1 x 2.2 cm = volume: 9.4 mL. Normal morphology without mass. Blood flow present within RIGHT ovary on color Doppler imaging. Left ovary Measurements: 3.5 x 2.0 x 2.6 cm = volume: 9.2 mL. Normal morphology without mass. Blood flow present within LEFT ovary on color Doppler imaging. Other findings No free pelvic fluid.  No adnexal masses. IMPRESSION: Normal exam. Electronically Signed   By: Ulyses Southward M.D.   On: 09/30/2018 11:31   Assessment & Plan:  23yo [id number] who is following up abnormal uterine bleeding and weight loss. Results reviewed, all within normal limits. Pap smear also normal. TVUS within normal limits. Discussed that abnormal uterine bleeding most likely related to transition off of depo, as well as weight loss. Reviewed return precautions. Counseled on prenatal vitamin since trying to conceive. Patient voiced understanding of plan, all questions answered.   Routine preventative  health maintenance  measures emphasized. Please refer to After Visit Summary for other counseling recommendations.   Cristal Deer. Earlene Plater, DO OB Family Medicine Fellow, [facility] for [employer], [facility]

## 2018-10-12 ENCOUNTER — Other Ambulatory Visit: Payer: Self-pay

## 2018-10-12 ENCOUNTER — Emergency Department (HOSPITAL_COMMUNITY)
Admission: EM | Admit: 2018-10-12 | Discharge: 2018-10-12 | Disposition: A | Discharge status: Home / Self Care | Payer: Medicaid Other | Attending: Emergency Medicine | Admitting: Emergency Medicine

## 2018-10-12 ENCOUNTER — Emergency Department (HOSPITAL_COMMUNITY): Payer: Medicaid Other

## 2018-10-12 ENCOUNTER — Encounter (HOSPITAL_COMMUNITY): Payer: Self-pay | Admitting: *Deleted

## 2018-10-12 DIAGNOSIS — R748 Abnormal levels of other serum enzymes: Secondary | ICD-10-CM | POA: Diagnosis not present

## 2018-10-12 DIAGNOSIS — Z87891 Personal history of nicotine dependence: Secondary | ICD-10-CM | POA: Insufficient documentation

## 2018-10-12 DIAGNOSIS — R1010 Upper abdominal pain, unspecified: Secondary | ICD-10-CM

## 2018-10-12 DIAGNOSIS — K2920 Alcoholic gastritis without bleeding: Secondary | ICD-10-CM | POA: Diagnosis not present

## 2018-10-12 DIAGNOSIS — R112 Nausea with vomiting, unspecified: Secondary | ICD-10-CM

## 2018-10-12 LAB — URINALYSIS, ROUTINE W REFLEX MICROSCOPIC
Bilirubin Urine: NEGATIVE
Glucose, UA: NEGATIVE mg/dL
Hgb urine dipstick: NEGATIVE
Ketones, ur: NEGATIVE mg/dL
Leukocytes,Ua: NEGATIVE
Nitrite: NEGATIVE
Protein, ur: NEGATIVE mg/dL
Specific Gravity, Urine: 1.015 (ref 1.005–1.030)
pH: 9 — ABNORMAL HIGH (ref 5.0–8.0)

## 2018-10-12 LAB — COMPREHENSIVE METABOLIC PANEL
ALT: 29 U/L (ref 0–44)
AST: 23 U/L (ref 15–41)
Albumin: 4.2 g/dL (ref 3.5–5.0)
Alkaline Phosphatase: 63 U/L (ref 38–126)
Anion gap: 8 (ref 5–15)
BUN: 8 mg/dL (ref 6–20)
CO2: 21 mmol/L — ABNORMAL LOW (ref 22–32)
Calcium: 9 mg/dL (ref 8.9–10.3)
Chloride: 110 mmol/L (ref 98–111)
Creatinine, Ser: 0.77 mg/dL (ref 0.44–1.00)
GFR calc Af Amer: >60 mL/min (ref >60)
GFR calc non Af Amer: >60 mL/min (ref >60)
Glucose, Bld: 91 mg/dL (ref 70–99)
Potassium: 4.1 mmol/L (ref 3.5–5.1)
Sodium: 139 mmol/L (ref 135–145)
Total Bilirubin: 0.5 mg/dL (ref 0.3–1.2)
Total Protein: 7.4 g/dL (ref 6.5–8.1)

## 2018-10-12 LAB — CBC
HCT: 38.8 % (ref 36.0–46.0)
Hemoglobin: 12.8 g/dL (ref 12.0–15.0)
MCH: 30 pg (ref 26.0–34.0)
MCHC: 33 g/dL (ref 30.0–36.0)
MCV: 90.9 fL (ref 80.0–100.0)
Platelets: 246 10*3/uL (ref 150–400)
RBC: 4.27 MIL/uL (ref 3.87–5.11)
RDW: 12.6 % (ref 11.5–15.5)
WBC: 2.8 10*3/uL — ABNORMAL LOW (ref 4.0–10.5)
nRBC: 0 % (ref 0.0–0.2)

## 2018-10-12 LAB — I-STAT BETA HCG BLOOD, ED (MC, WL, AP ONLY): I-stat hCG, quantitative: <5 m[IU]/mL (ref <5)

## 2018-10-12 LAB — LIPASE, BLOOD: Lipase: 104 U/L — ABNORMAL HIGH (ref 11–51)

## 2018-10-12 MED ORDER — SODIUM CHLORIDE (PF) 0.9 % IJ SOLN
INTRAMUSCULAR | Status: AC
  Filled 2018-10-12: qty 50

## 2018-10-12 MED ORDER — ONDANSETRON HCL 4 MG/2ML IJ SOLN
4.0000 mg | Freq: Once | INTRAMUSCULAR | Status: AC
  Administered 2018-10-12: 4 mg via INTRAVENOUS
  Filled 2018-10-12: qty 2

## 2018-10-12 MED ORDER — SODIUM CHLORIDE 0.9% FLUSH
3.0000 mL | Freq: Once | INTRAVENOUS | Status: AC
  Administered 2018-10-12: 3 mL via INTRAVENOUS

## 2018-10-12 MED ORDER — IOPAMIDOL (ISOVUE-300) INJECTION 61%
INTRAVENOUS | Status: AC
  Filled 2018-10-12: qty 100

## 2018-10-12 MED ORDER — IOPAMIDOL (ISOVUE-300) INJECTION 61%
100.0000 mL | Freq: Once | INTRAVENOUS | Status: AC | PRN
  Administered 2018-10-12: 100 mL via INTRAVENOUS

## 2018-10-12 MED ORDER — SODIUM CHLORIDE 0.9 % IV BOLUS
1000.0000 mL | Freq: Once | INTRAVENOUS | Status: AC
  Administered 2018-10-12: 1000 mL via INTRAVENOUS

## 2018-10-12 MED ORDER — MORPHINE SULFATE (PF) 4 MG/ML IV SOLN
4.0000 mg | Freq: Once | INTRAVENOUS | Status: AC
  Administered 2018-10-12: 4 mg via INTRAVENOUS
  Filled 2018-10-12: qty 1

## 2018-10-12 MED ORDER — ONDANSETRON 4 MG PO TBDP: 4.0000 mg | ORAL_TABLET | Freq: Three times a day (TID) | ORAL | 0 refills | Status: DC | PRN

## 2018-10-12 MED ORDER — RANITIDINE HCL 150 MG PO TABS: 150.0000 mg | ORAL_TABLET | Freq: Two times a day (BID) | ORAL | 0 refills | Status: DC

## 2018-10-12 NOTE — ED Triage Notes (Signed)
EMS states pt stated she went out drinking and smoked marijuana, can not recall how much she drank or smoked. Everything is foggy. #18 L AC 350 NS Bolus, pt poor historian. CBG 88 121/82-70-100% RA On arrival BP 80/40 as she was crouched into fetal position.

## 2018-10-12 NOTE — ED Provider Notes (Signed)
[facility]-EMERGENCY DEPT Provider Note   CSN: [id number] Arrival date & time: 10/12/18  1259    History   Chief Complaint Chief Complaint  Patient presents with  . Abdominal Pain  . Emesis    HPI    Melanie Hancock is a 23 y.o. female with a PMHx of anxiety, anemia, chronic back pain, depression, and other conditions listed below, who presents to the ED with complaints of epigastric abdominal pain, nausea, and vomiting that began this morning after she woke up.  She describes the pain is 7/10 constant dull with occasional sharp nonradiating epigastric pain that worsens with movement, with no treatments tried prior to arrival.  She reports associated nausea and 4 episodes of nonbloody nonbilious emesis.  She admits to drinking alcohol last night, states that she had 2 cups of mixed liquor drinks, she also endorses using marijuana last night.  She denies any fevers, chills, chest pain, shortness of breath, diarrhea, constipation, melena, hematochezia, obstipation, dysuria, hematuria, vaginal bleeding or discharge, myalgias or arthralgias, numbness, tingling, focal weakness, or any other complaints at this time.  She denies any recent travel, sick contacts, suspicious food intake, NSAID use, or prior abdominal surgeries.  The history is provided by the patient and medical records. No language interpreter was used.  Abdominal Pain  Associated symptoms: nausea and vomiting   Associated symptoms: no chest pain, no chills, no constipation, no diarrhea, no dysuria, no fever, no hematuria, no shortness of breath, no vaginal bleeding and no vaginal discharge   Emesis  Associated symptoms: abdominal pain   Associated symptoms: no arthralgias, no chills, no diarrhea, no fever and no myalgias     Past Medical History:  Diagnosis Date  . Anemia   . Anxiety   . Chronic back pain   . Depression   . Infection    UTI  . Post partum depression   . Pyelonephritis   . UTI  (lower urinary tract infection)     Patient Active Problem List   Diagnosis Date Noted  . Vaginal delivery 02/04/2016  . History of postpartum depression, currently pregnant 01/23/2016  . Iron deficiency anemia 01/02/2016  . 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 Trichomoniasis 06/17/2015  . Anxiety 03/28/2014  . Chronic back pain 03/28/2014  . Hx pyelonephritis 2014 05/26/2013    Past Surgical History:  Procedure Laterality Date  . DILATION AND CURETTAGE OF UTERUS    . INDUCED ABORTION    . WISDOM TOOTH EXTRACTION       OB History    Gravida  4   Para  3   Term  3   Preterm      AB  1   Living  3     SAB  0   TAB  1   Ectopic      Multiple  0   Live Births  3            Home Medications    Prior to Admission medications   Not on File    Family History Family History  Problem Relation Age of Onset  . Arthritis Mother   . Arthritis Maternal Grandmother   . Asthma Son   . Alcohol abuse Neg Hx     Social History Social History   Tobacco Use  . Smoking status: Former Smoker    Last attempt to quit: 01/21/2014    Years since quitting: 4.7  . Smokeless tobacco: Never Used  Substance Use Topics  . Alcohol use: No  . Drug use: No     Allergies   Patient has no known allergies.   Review of Systems Review of Systems  Constitutional: Negative for chills and fever.  Respiratory: Negative for shortness of breath.   Cardiovascular: Negative for chest pain.  Gastrointestinal: Positive for abdominal pain, nausea and vomiting. Negative for blood in stool, constipation and diarrhea.  Genitourinary: Negative for dysuria, hematuria, vaginal bleeding and vaginal discharge.  Musculoskeletal: Negative for arthralgias and myalgias.  Skin: Negative for color change.  Allergic/Immunologic: Negative for immunocompromised state.  Neurological: Negative for weakness and numbness.  Psychiatric/Behavioral: Negative for confusion.    All other systems reviewed and are negative for acute change except as noted in the HPI.    Physical Exam Updated Vital Signs BP 136/76 (BP Location: Left Arm)   Pulse 74   Temp 97.6 F (36.4 C) (Oral)   Resp 18   Ht 5' 5.5" (1.664 m)   Wt 73.8 kg   SpO2 100%   BMI 26.66 kg/m   Physical Exam Vitals signs and nursing note reviewed.  Constitutional:      General: She is not in acute distress.    Appearance: Normal appearance. She is well-developed. She is not toxic-appearing.     Comments: Afebrile, nontoxic, NAD  HENT:     Head: Normocephalic and atraumatic.  Eyes:     General:        Right eye: No discharge.        Left eye: No discharge.     Conjunctiva/sclera: Conjunctivae normal.  Neck:     Musculoskeletal: Normal range of motion and neck supple.  Cardiovascular:     Rate and Rhythm: Normal rate and regular rhythm.     Pulses: Normal pulses.     Heart sounds: Normal heart sounds, S1 normal and S2 normal. No murmur. No friction rub. No gallop.   Pulmonary:     Effort: Pulmonary effort is normal. No respiratory distress.     Breath sounds: Normal breath sounds. No decreased breath sounds, wheezing, rhonchi or rales.  Abdominal:     General: Bowel sounds are normal. There is no distension.     Palpations: Abdomen is soft. Abdomen is not rigid.     Tenderness: There is generalized abdominal tenderness and tenderness in the epigastric area and left upper quadrant. There is no right CVA tenderness, left CVA tenderness, guarding or rebound. Negative signs include Murphy's sign and McBurney's sign.     Comments: Soft, nondistended, +BS throughout, with mild generalized abd TTP but moderate focal epigastric and LUQ TTP, no focal RUQ tenderness, no r/g/r, neg murphy's, neg mcburney's, no CVA TTP   Musculoskeletal: Normal range of motion.  Skin:    General: Skin is warm and dry.     Findings: No rash.  Neurological:     Mental Status: She is alert and oriented to person,  place, and time.     Sensory: Sensation is intact. No sensory deficit.     Motor: Motor function is intact.  Psychiatric:        Mood and Affect: Mood and affect normal.        Behavior: Behavior normal.      ED Treatments / Results  Labs (all labs ordered are listed, but only abnormal results are displayed) Labs Reviewed  LIPASE, BLOOD - Abnormal; Notable for the following components:      Result Value   Lipase 104 (*)  All other components within normal limits  COMPREHENSIVE METABOLIC PANEL - Abnormal; Notable for the following components:   CO2 21 (*)    All other components within normal limits  CBC - Abnormal; Notable for the following components:   WBC 2.8 (*)    All other components within normal limits  URINALYSIS, ROUTINE W REFLEX MICROSCOPIC - Abnormal; Notable for the following components:   pH 9.0 (*)    All other components within normal limits  I-STAT BETA HCG BLOOD, ED ([facility], [facility], [facility] ONLY)    EKG None  Radiology Ct Abdomen Pelvis W Contrast  Result Date: 10/12/2018 CLINICAL DATA:  Abdominal pain. EXAM: CT ABDOMEN AND PELVIS WITH CONTRAST TECHNIQUE: Multidetector CT imaging of the abdomen and pelvis was performed using the standard protocol following bolus administration of intravenous contrast. CONTRAST:  ISOVUE-300 IOPAMIDOL (ISOVUE-300) INJECTION 61% COMPARISON:  None. FINDINGS: Lower chest: No acute abnormality. Hepatobiliary: No focal liver abnormality is seen. No gallstones, gallbladder wall thickening, or biliary dilatation. Pancreas: Unremarkable. No pancreatic ductal dilatation or surrounding inflammatory changes. Spleen: Normal in size without focal abnormality. Adrenals/Urinary Tract: Adrenal glands are unremarkable. Kidneys are normal, without renal calculi, focal lesion, or hydronephrosis. Diffuse urinary bladder wall thickening. Stomach/Bowel: Stomach is within normal limits. Appendix appears normal. No evidence of bowel wall thickening,  distention, or inflammatory changes. Vascular/Lymphatic: No significant vascular findings are present. No enlarged abdominal or pelvic lymph nodes. Reproductive: Uterus and bilateral adnexa are unremarkable. Other: No abdominal wall hernia or abnormality. No abdominopelvic ascites. Musculoskeletal: No acute or significant osseous findings. IMPRESSION: 1. No evidence of acute abnormalities within the solid abdominal organs. 2. Diffuse urinary bladder wall thickening, which is not fully distended. Please correlate to urinalysis to exclude urinary tract infection. Electronically Signed   By: Ted Mcalpine M.D.   On: 10/12/2018 15:24    Procedures Procedures (including critical care time)  Medications Ordered in ED Medications  sodium chloride flush (NS) 0.9 % injection 3 mL (3 mLs Intravenous Given 10/12/18 1341)  morphine 4 MG/ML injection 4 mg (4 mg Intravenous Given 10/12/18 1439)  ondansetron (ZOFRAN) injection 4 mg (4 mg Intravenous Given 10/12/18 1439)  sodium chloride 0.9 % bolus 1,000 mL (0 mLs Intravenous Stopped 10/12/18 1641)  iopamidol (ISOVUE-300) 61 % injection 100 mL (100 mLs Intravenous Contrast Given 10/12/18 1452)     Initial Impression / Assessment and Plan / ED Course  I have reviewed the triage vital signs and the nursing notes.  Pertinent labs & imaging results that were available during my care of the patient were reviewed by me and considered in my medical decision making (see chart for details).        23 y.o. female here with abdominal pain, nausea, and vomiting that began this morning when she woke up.  She endorses drinking alcohol last night and smoking marijuana.  On exam, mild diffuse tenderness to her abdomen but most focally in the epigastric and LUQ area, non-peritoneal.  Negative Murphy sign and no focal RUQ tenderness.  Work-up thus far reveals beta hCG negative, lipase elevated at 104, CMP fairly unremarkable, CBC with WBC 2.8 but otherwise unremarkable.   Will obtain CT scan to evaluate for pancreatitis, will give pain medicine and nausea medicine, and reassess shortly.  5:09 PM U/A unremarkable. CT negative for acute pancreatitis or other concerning findings. Suspect very mild irritation of pancreas from alcohol consumption, but not enough to cause overt pancreatitis. Likely some component of gastritis as well. Pt feeling better and  tolerating PO well here. Doubt need for further emergent work up or intervention. Will send home with zantac and zofran, advised diet/lifestyle modifications for GERD/gastritis/PUD/etc. Low fat diet advised. Tylenol for pain, avoid NSAIDs. F/up with PCP in 1wk for recheck. Strict return precautions advised. I explained the diagnosis and have given explicit precautions to return to the ER including for any other new or worsening symptoms. The patient understands and accepts the medical plan as it's been dictated and I have answered their questions. Discharge instructions concerning home care and prescriptions have been given. The patient is STABLE and is discharged to home in good condition.     Final Clinical Impressions(s) / ED Diagnoses   Final diagnoses:  Upper abdominal pain  Nausea and vomiting in adult patient  Elevated lipase  Acute alcoholic gastritis without hemorrhage    ED Discharge Orders         Ordered    ranitidine (ZANTAC) 150 MG tablet  2 times daily     10/12/18 1709    ondansetron (ZOFRAN ODT) 4 MG disintegrating tablet  Every 8 hours PRN     10/12/18 [address], [place], [place] 10/12/18 1709    Gerhard Munch, MD 10/16/18 1900

## 2018-10-12 NOTE — ED Notes (Signed)
Bed: WA17 Expected date:  Expected time:  Means of arrival:  Comments: 

## 2018-10-12 NOTE — Discharge Instructions (Addendum)
Your abdominal pain is likely from the alcohol consumption last night, which likely aggravated your pancreas, and possibly caused some gastritis. You will need to take zantac as directed, and avoid spicy/fatty/acidic foods, avoid soda/coffee/tea/alcohol. Avoid laying down flat within 30 minutes of eating. Avoid NSAIDs like ibuprofen/aleve/motrin/etc on an empty stomach. May consider using over the counter tums/maalox as needed for additional relief. Use zofran as directed as needed for nausea. Use tylenol as needed for pain. Follow up with your regular doctor in 5-7 days for recheck of symptoms. Return to the ER for changes or worsening symptoms.  Abdominal (belly) pain can be caused by many things. Your caregiver performed an examination and possibly ordered blood/urine tests and imaging (CT scan, x-rays, ultrasound). Many cases can be observed and treated at home after initial evaluation in the emergency department. Even though you are being discharged home, abdominal pain can be unpredictable. Therefore, you need a repeated exam if your pain does not resolve, returns, or worsens. Most patients with abdominal pain don't have to be admitted to the hospital or have surgery, but serious problems like appendicitis and gallbladder attacks can start out as nonspecific pain. Many abdominal conditions cannot be diagnosed in one visit, so follow-up evaluations are very important. SEEK IMMEDIATE MEDICAL ATTENTION IF YOU DEVELOP ANY OF THE FOLLOWING SYMPTOMS: The pain does not go away or becomes severe.  A temperature above 101 develops.  Repeated vomiting occurs (multiple episodes).  The pain becomes localized to portions of the abdomen. The right side could possibly be appendicitis. In an adult, the left lower portion of the abdomen could be colitis or diverticulitis.  Blood is being passed in stools or vomit (bright red or black tarry stools).  Return also if you develop chest pain, difficulty breathing,  dizziness or fainting, or become confused, poorly responsive, or inconsolable (young children). The constipation stays for more than 4 days.  There is belly (abdominal) or rectal pain.  You do not seem to be getting better.

## 2018-10-12 NOTE — ED Notes (Signed)
Tolerating po's well at present time. Feels much better

## 2019-01-07 ENCOUNTER — Telehealth: Payer: Medicaid Other

## 2019-01-07 ENCOUNTER — Ambulatory Visit (INDEPENDENT_AMBULATORY_CARE_PROVIDER_SITE_OTHER)
Admission: RE | Admit: 2019-01-07 | Discharge: 2019-01-07 | Disposition: A | Discharge status: Home / Self Care | Payer: Medicaid Other | Source: Ambulatory Visit

## 2019-01-07 DIAGNOSIS — R112 Nausea with vomiting, unspecified: Secondary | ICD-10-CM

## 2019-01-07 MED ORDER — ONDANSETRON 4 MG PO TBDP: 4.0000 mg | ORAL_TABLET | Freq: Three times a day (TID) | ORAL | 0 refills | Status: DC | PRN

## 2019-01-07 NOTE — Discharge Instructions (Addendum)
I believe this is some sort of viral illness or something that you ate.  Zofran sent to the pharmacy for nausea and vomiting.  Sip fluids and stay hydrated.  If symptoms return or worsen you will need to be seen in person.  Work note provided.  Follow up as needed for continued or worsening symptoms

## 2019-01-07 NOTE — ED Provider Notes (Signed)
Virtual Visit via Video Note:  [name]  initiated request for Telemedicine visit with [facility] team. I connected with [name]  on 01/07/2019 at 11:51 AM  for a synchronized telemedicine visit using a video enabled HIPPA compliant telemedicine application. I verified that I am speaking with [name]  using two identifiers. Janace Aris, NP  was physically located in a [facility] Urgent care site and MEKENNA GITTENS was located at a different location.   The limitations of evaluation and management by telemedicine as well as the availability of in-person appointments were discussed. Patient was informed that she  may incur a bill ( including co-pay) for this virtual visit encounter. Berneta L Irion  expressed understanding and gave verbal consent to proceed with virtual visit.     History of Present Illness:Melanie Hancock  is a 23 y.o. female presents with N,V,D that started yesterday morning. Symptoms have somewhat improved since. She is still having some nausea. She has been sipping fluids. She ate spaghetti and [employer] the day prior. She had 4 episodes of non bilious vomiting and 3 episodes of liquid diarrhea. No blood in stool or vomit. No fever. She is having some soreness in her abdomen and back due to vomiting. Denies any specific abd pain. No dysuria, hematuria or urinary frequency.  No vaginal discharge, itching or irritation.  Last menstrual period was last week.  Denies any concern for STDs.  No recent sick contacts or recent traveling.  No cough, congestion, sore throat ear pain or rhinorrhea.  ROS per HPI   Past Medical History:  Diagnosis Date  . Anemia   . Anxiety   . Chronic back pain   . Depression   . Infection    UTI  . Post partum depression   . Pyelonephritis   . UTI (lower urinary tract infection)     No Known Allergies      Observations/Objective: GENERAL APPEARANCE: Well developed, well nourished, alert and cooperative, and  appears to be in no acute distress. HEAD: normocephalic. Non labored breathing, no dyspnea or distress Skin: Skin normal color  PSYCHIATRIC: The mental examination revealed the patient was oriented to person, place, and time. The patient was able to demonstrate good judgement and reason, without hallucinations, abnormal affect or abnormal behaviors during the examination. Patient is not suicidal     Assessment and Plan: Symptoms consistent with either a viral gastroenteritis or food poisoning. We will have her monitor her symptoms, Zofran for nausea and vomiting as needed.  Recommended sipping fluids to stay hydrated. If her symptoms return or worsen she will need to be seen face-to-face.  Patient understanding and agree.   Follow Up Instructions:Follow up as needed for continued or worsening symptoms     I discussed the assessment and treatment plan with the patient. The patient was provided an opportunity to ask questions and all were answered. The patient agreed with the plan and demonstrated an understanding of the instructions.   The patient was advised to call back or seek an in-person evaluation if the symptoms worsen or if the condition fails to improve as anticipated.     Janace Aris, NP  01/07/2019 11:51 AM         Janace Aris, NP 01/07/19 1208

## 2019-01-08 ENCOUNTER — Ambulatory Visit (HOSPITAL_COMMUNITY)
Admission: EM | Admit: 2019-01-08 | Discharge: 2019-01-08 | Disposition: A | Discharge status: Home / Self Care | Payer: Medicaid Other | Attending: Family Medicine | Admitting: Family Medicine

## 2019-01-08 ENCOUNTER — Encounter (HOSPITAL_COMMUNITY): Payer: Self-pay

## 2019-01-08 ENCOUNTER — Other Ambulatory Visit: Payer: Self-pay

## 2019-01-08 DIAGNOSIS — R112 Nausea with vomiting, unspecified: Secondary | ICD-10-CM | POA: Insufficient documentation

## 2019-01-08 DIAGNOSIS — M546 Pain in thoracic spine: Secondary | ICD-10-CM | POA: Insufficient documentation

## 2019-01-08 LAB — POCT PREGNANCY, URINE: Preg Test, Ur: NEGATIVE

## 2019-01-08 LAB — POCT URINALYSIS DIP (DEVICE)
Bilirubin Urine: NEGATIVE
Glucose, UA: NEGATIVE mg/dL
Ketones, ur: NEGATIVE mg/dL
Leukocytes,Ua: NEGATIVE
Nitrite: NEGATIVE
Protein, ur: NEGATIVE mg/dL
Specific Gravity, Urine: >=1.03 (ref 1.005–1.030)
Urobilinogen, UA: 0.2 mg/dL (ref 0.0–1.0)
pH: 7 (ref 5.0–8.0)

## 2019-01-08 MED ORDER — CYCLOBENZAPRINE HCL 5 MG PO TABS: 5.0000 mg | ORAL_TABLET | Freq: Two times a day (BID) | ORAL | 0 refills | Status: DC | PRN

## 2019-01-08 MED ORDER — OMEPRAZOLE 20 MG PO CPDR: 20.0000 mg | DELAYED_RELEASE_CAPSULE | Freq: Every day | ORAL | 0 refills | Status: DC

## 2019-01-08 MED ORDER — IBUPROFEN 600 MG PO TABS: 600.0000 mg | ORAL_TABLET | Freq: Four times a day (QID) | ORAL | 0 refills | Status: DC | PRN

## 2019-01-08 NOTE — Discharge Instructions (Signed)
Your pregnancy test was negative today We will send the swab off to check for any other causes of pelvic pain and discharge  Nausea Nausea most likely viral Please pick up zofran  For nausea: Zofran prescribed. Begin with every 6 hours, than as you are able to hold food down, take it as needed. Start with clear liquids, then move to plain foods like bananas, rice, applesauce, toast, broth, grits, oatmeal. As those food settle okay you may transition to your normal foods. Avoid spicy and greasy foods as much as possible.  Preventing dehydration is key! You need to replace the fluid your body is expelling. Drink plenty of fluids, may use Pedialyte or sports drinks.   Please return if you are experiencing blood in your vomit or stool or experiencing dizziness, lightheadedness, extreme fatigue, increased abdominal pain.   Back Pain Most likely muscle strain Hold off on ibuprofen until nausea and vomiting resolved and tolerating solids Use anti-inflammatories for pain/swelling. You may take up to 800 mg Ibuprofen every 8 hours with food. You may supplement Ibuprofen with Tylenol [id number] mg every 8 hours.  You may use flexeril as needed to help with pain. This is a muscle relaxer and causes sedation- please use only at bedtime or when you will be home and not have to drive/work  You may use flexeril as needed to help with pain. This is a muscle relaxer and causes sedation- please use only at bedtime or when you will be home and not have to drive/work  Follow up if symptoms not improving or wrosening

## 2019-01-08 NOTE — ED Provider Notes (Signed)
[facility]    CSN: [id number] Arrival date & time: 01/08/19  1446     History   Chief Complaint Chief Complaint  Patient presents with   Nausea   Emesis    HPI Melanie Hancock is a 23 y.o. female no contributing past medical history presenting today for evaluation of back pain as well as nausea and vomiting.  Patient notes that over the past 2 days she has had nausea and vomiting with an uneasiness in her stomach.  She has had difficulty tolerating solids and liquids over the past couple days.  Last episode of vomiting was this morning while she was brushing her teeth.  She has not had much of an appetite and has not attempted to eat throughout the day.  Denies any fevers.  Denies associated URI symptoms.  She has noted some occasional sensations of reflux especially after drinking sodas.  She was seen by video visit yesterday and prescribed Zofran for likely gastroenteritis, she has not picked this medicine up.  She plans to go and pick this up after leaving here today.  Denies urinary symptoms of dysuria or increased frequency.  She has had some mild lower pelvic pain and states that her partner has noted that "things felt different down there" and she has been "wetter" than normal.  She denies vaginal discharge at other times.  Notes that last time she had similar symptoms she was pregnant.  Last menstrual period was 5/8-12 when she had some spotting.  Had diarrhea on day 1 but this is not been persistent.  She also is coming in for evaluation of back pain.  She notes that earlier this week she almost fell off of stool and caught herself and since has had pain across her mid back.  Denies falling on the ground or landing directly on back.  She did feel like as if she felt a pop.  Has had discomfort with deep breaths since.  She has not taken any medicines for her symptoms.  Pain has improved slightly with massage.  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 Authorizing Provider  cyclobenzaprine (  FLEXERIL) 5 MG tablet Take 1-2 tablets (5-10 mg total) by mouth 2 (two) times daily as needed for muscle spasms. 01/08/19   Anan Dapolito C, PA-C  ibuprofen (ADVIL) 600 MG tablet Take 1 tablet (600 mg total) by mouth every 6 (six) hours as needed. 01/08/19   Adeeb Konecny C, PA-C  omeprazole (PRILOSEC) 20 MG capsule Take 1 capsule (20 mg total) by mouth daily. 01/08/19   Lauria Depoy C, PA-C  ondansetron (ZOFRAN ODT) 4 MG disintegrating tablet Take 1 tablet (4 mg total) by mouth every 8 (eight) hours as needed for nausea or vomiting. 01/07/19   Dahlia Byes A, NP  ranitidine (ZANTAC) 150 MG tablet Take 1 tablet (150 mg total) by mouth 2 (two) times daily. 10/12/18   Street, [place], PA-C  VALACYCLOVIR HCL PO Take 1 tablet by mouth daily as needed (outbreaks).    [provider]    Family History Family History  Problem Relation Age of Onset   Arthritis Mother    Arthritis Maternal Grandmother      Asthma Son    Alcohol abuse Neg Hx     Social History Social History   Tobacco Use   Smoking status: Former Smoker    Last attempt to quit: 01/21/2014    Years since quitting: 4.9   Smokeless tobacco: Never Used  Substance Use Topics   Alcohol use: No   Drug use: No     Allergies   Patient has no known allergies.   Review of Systems Review of Systems  Constitutional: Positive for appetite change. Negative for activity change, chills, fatigue and fever.  HENT: Negative for congestion, ear pain, rhinorrhea, sinus pressure, sore throat and trouble swallowing.   Eyes: Negative for discharge and redness.  Respiratory: Negative for cough, chest tightness and shortness of breath.   Cardiovascular: Negative for chest pain.  Gastrointestinal: Positive for nausea and vomiting. Negative for abdominal pain and diarrhea.  Genitourinary: Positive for pelvic pain and vaginal discharge. Negative for dysuria, flank pain, genital sores, hematuria, menstrual problem, vaginal bleeding and vaginal pain.  Musculoskeletal: Positive for back pain and myalgias.  Skin: Negative for rash.  Neurological: Negative for dizziness, light-headedness and headaches.     Physical Exam Triage Vital Signs ED Triage Vitals  Enc Vitals Group     BP 01/08/19 1506 111/68     Pulse Rate 01/08/19 1506 (!) 53     Resp 01/08/19 1506 18     Temp 01/08/19 1506 98.3 F (36.8 C)     Temp Source 01/08/19 1506 Oral     SpO2 01/08/19 1506 100 %     Weight --      Height --      Head Circumference --      Peak Flow --      Pain Score 01/08/19 1502 8     Pain Loc --      Pain Edu? --      Excl. in GC? --    No data found.  Updated Vital Signs BP 111/68 (BP Location: Left Arm)    Pulse (!) 53    Temp 98.3 F (36.8 C) (Oral)    Resp 18    SpO2 100%   Visual Acuity Right Eye Distance:   Left Eye Distance:   Bilateral Distance:    Right Eye Near:   Left Eye Near:    Bilateral Near:     Physical  Exam Vitals signs and nursing note reviewed.  Constitutional:  General: She is not in acute distress.    Appearance: She is well-developed.  HENT:     Head: Normocephalic and atraumatic.     Mouth/Throat:     Comments: Oral mucosa pink and moist, no tonsillar enlargement or exudate. Posterior pharynx patent and nonerythematous, no uvula deviation or swelling. Normal phonation. Eyes:     Extraocular Movements: Extraocular movements intact.     Conjunctiva/sclera: Conjunctivae normal.     Pupils: Pupils are equal, round, and reactive to light.  Neck:     Musculoskeletal: Neck supple.     Comments: Full active range of motion of neck Cardiovascular:     Rate and Rhythm: Normal rate and regular rhythm.     Heart sounds: No murmur.  Pulmonary:     Effort: Pulmonary effort is normal. No respiratory distress.     Breath sounds: Normal breath sounds.     Comments: Breathing comfortably at rest, CTABL, no wheezing, rales or other adventitious sounds auscultated Abdominal:     Palpations: Abdomen is soft.     Tenderness: There is abdominal tenderness.     Comments: Abdomen soft, nondistended, mild tenderness to right lower quadrant, negative rebound, negative McBurney's  Musculoskeletal:     Comments: Tenderness throughout bilateral thoracic musculature extending to right flank, tenderness seems more prominent on right side with palpation, minimal tenderness to palpation of cervical, thoracic and lumbar spine midline  Gait without abnormality  Skin:    General: Skin is warm and dry.  Neurological:     Mental Status: She is alert.      UC Treatments / Results  Labs (all labs ordered are listed, but only abnormal results are displayed) Labs Reviewed  POCT URINALYSIS DIP (DEVICE) - Abnormal; Notable for the following components:      Result Value   Hgb urine dipstick TRACE (*)    All other components within normal limits  POC URINE PREG, ED  POCT PREGNANCY, URINE    CERVICOVAGINAL ANCILLARY ONLY    EKG None  Radiology No results found.  Procedures Procedures (including critical care time)  Medications Ordered in UC Medications - No data to display  Initial Impression / Assessment and Plan / UC Course  I have reviewed the triage vital signs and the nursing notes.  Pertinent labs & imaging results that were available during my care of the patient were reviewed by me and considered in my medical decision making (see chart for details).     Lungs clear, diffuse tenderness to thoracic musculature, most likely muscular strain as cause of pain, do not suspect underlying rib fracture given mechanism of injury, but will recommend same treatment of anti-inflammatories and muscle relaxers as needed for discomfort.  Nausea and vomiting most likely viral given improving, likely needs more time to fully resolve.  Abdominal exam negative for peritoneal signs, do not suspect underlying abdominal emergency.  Recommended picking up Zofran to use as needed for nausea and vomiting and to further help with oral intake.  Push fluids.  Also provided omeprazole to use for reflux, may use Maalox for symptomatic relief.  Pregnancy test negative, swab obtained to check for causes of discharge, will call patient with results and provide further treatment if needed.  Discussed strict return precautions. Patient verbalized understanding and is agreeable with plan.  Final Clinical Impressions(s) / UC Diagnoses   Final diagnoses:  Non-intractable vomiting with nausea, unspecified vomiting type  Acute bilateral thoracic back pain     Discharge Instructions  Your pregnancy test was negative today We will send the swab off to check for any other causes of pelvic pain and discharge  Nausea Nausea most likely viral Please pick up zofran  For nausea: Zofran prescribed. Begin with every 6 hours, than as you are able to hold food down, take it as needed. Start with  clear liquids, then move to plain foods like bananas, rice, applesauce, toast, broth, grits, oatmeal. As those food settle okay you may transition to your normal foods. Avoid spicy and greasy foods as much as possible.  Preventing dehydration is key! You need to replace the fluid your body is expelling. Drink plenty of fluids, may use Pedialyte or sports drinks.   Please return if you are experiencing blood in your vomit or stool or experiencing dizziness, lightheadedness, extreme fatigue, increased abdominal pain.   Back Pain Most likely muscle strain Hold off on ibuprofen until nausea and vomiting resolved and tolerating solids Use anti-inflammatories for pain/swelling. You may take up to 800 mg Ibuprofen every 8 hours with food. You may supplement Ibuprofen with Tylenol [phone] mg every 8 hours.  You may use flexeril as needed to help with pain. This is a muscle relaxer and causes sedation- please use only at bedtime or when you will be home and not have to drive/work  You may use flexeril as needed to help with pain. This is a muscle relaxer and causes sedation- please use only at bedtime or when you will be home and not have to drive/work  Follow up if symptoms not improving or wrosening    ED Prescriptions    Medication Sig Dispense Auth. Provider   cyclobenzaprine (FLEXERIL) 5 MG tablet Take 1-2 tablets (5-10 mg total) by mouth 2 (two) times daily as needed for muscle spasms. 24 tablet Stepehn Eckard C, PA-C   omeprazole (PRILOSEC) 20 MG capsule Take 1 capsule (20 mg total) by mouth daily. 14 capsule Dilan Novosad C, PA-C   ibuprofen (ADVIL) 600 MG tablet Take 1 tablet (600 mg total) by mouth every 6 (six) hours as needed. 30 tablet Ronne Savoia, EctorHallie C, PA-C     Controlled Substance Prescriptions [place] Controlled Substance Registry consulted? Not Applicable   Lew DawesWieters, Kimmy Parish C, [place] JerseyPA-C 01/08/19 1617

## 2019-01-08 NOTE — ED Triage Notes (Signed)
Patient presents to Urgent Care with complaints of nausea and emesis since yesterday, for which she did an e-visit. 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 Pt also endorses slipping in a puddle at work as well and now has back pain with deep breaths.

## 2019-01-09 LAB — CERVICOVAGINAL ANCILLARY ONLY
Bacterial vaginitis: POSITIVE — AB
Candida vaginitis: NEGATIVE
Chlamydia: NEGATIVE
Neisseria Gonorrhea: NEGATIVE
Trichomonas: NEGATIVE

## 2019-01-11 ENCOUNTER — Telehealth (HOSPITAL_COMMUNITY): Payer: Self-pay | Admitting: Emergency Medicine

## 2019-01-11 NOTE — Telephone Encounter (Signed)
Bacterial vaginosis is positive. Called patient and she states she has a scrip from her doctor. All questions answered.

## 2019-01-17 ENCOUNTER — Encounter

## 2019-02-10 ENCOUNTER — Telehealth: Payer: Self-pay | Admitting: Family Medicine

## 2019-02-10 NOTE — Telephone Encounter (Signed)
Attempted to call patient about her appointment, and how she needed to have her MyChart app downloaded in order to have this visit. A voicemail was left. °

## 2019-02-11 ENCOUNTER — Other Ambulatory Visit: Payer: Self-pay

## 2019-02-11 ENCOUNTER — Telehealth (INDEPENDENT_AMBULATORY_CARE_PROVIDER_SITE_OTHER): Payer: Medicaid Other | Admitting: Obstetrics and Gynecology

## 2019-02-11 ENCOUNTER — Encounter: Payer: Self-pay | Admitting: Obstetrics and Gynecology

## 2019-02-11 DIAGNOSIS — K5900 Constipation, unspecified: Secondary | ICD-10-CM

## 2019-02-11 DIAGNOSIS — R102 Pelvic and perineal pain: Secondary | ICD-10-CM

## 2019-02-11 DIAGNOSIS — G43809 Other migraine, not intractable, without status migrainosus: Secondary | ICD-10-CM

## 2019-02-11 NOTE — Progress Notes (Signed)
TELEHEALTH GYNECOLOGY VIRTUAL VIDEO VISIT ENCOUNTER NOTE  Provider location: [facility] [employer] [facility]   I connected with Melanie Hancock on 02/11/19 at  9:35 AM EDT by MyChart Video Encounter at home and verified that I am speaking with the correct person using two identifiers.   I discussed the limitations, risks, security and privacy concerns of performing an evaluation and management service by telephone and the availability of in person appointments. I also discussed with the patient that there may be a patient responsible charge related to this service. The patient expressed understanding and agreed to proceed.   History:  Melanie Hancock is a 23 y.o. [phone] female being evaluated today for severe stomach pains and migraines. Reports she has had this for about 2 weeks, had constant pain for about 3 days and now it is coming and going. Feels like bad menstrual cramps or contractions. Feels some relief when she has a bowel movement, but pain returns after. Also was nauseous the first week she had pain, but it improved after the first week. Is having unprotected intercourse, not on contraception. Period was late in May, was irregular, had spotting rather than full period. Due July 4 for next period.   Her bowel movements have not been normal in the last few weeks. She is constipated and has not been going like she normally would.    Past Medical History:  Diagnosis Date  . Anemia   . Anxiety   . Chronic back pain   . Depression   . Infection    UTI  . Post partum depression   . Pyelonephritis   . UTI (lower urinary tract infection)    Past Surgical History:  Procedure Laterality Date  . DILATION AND CURETTAGE OF UTERUS    . INDUCED ABORTION    . WISDOM TOOTH EXTRACTION     The following portions of the patient's history were reviewed and updated as appropriate: allergies, current medications, past family history, past medical history, past social history,  past surgical history and problem list.    Review of Systems:  Pertinent items noted in HPI and remainder of comprehensive ROS otherwise negative.  Physical Exam:   General:  Alert, oriented and cooperative. Patient appears to be in no acute distress.  Mental Status: Normal mood and affect. Normal behavior. Normal judgment and thought content.   Respiratory: Normal respiratory effort, no problems with respiration noted  Rest of physical exam deferred due to type of encounter  Labs and Imaging No results found for this or any previous visit (from the past 336 hour(s)). No results found.     Assessment and Plan:   1. Pelvic pain Possible due to early pregnancy or constipation - to take pregnancy test at home  2. Constipation, unspecified constipation type Reviewed importance of having regular bowel movements - encouraged miralax, increase fiber/water in diet  3. Other migraine without status migrainosus, not intractable Encouraged OTC meds to start as she has not taken anything for migraine yet    I discussed the assessment and treatment plan with the patient. The patient was provided an opportunity to ask questions and all were answered. The patient agreed with the plan and demonstrated an understanding of the instructions.   The patient was advised to call back or seek an in-person evaluation/go to the ED if the symptoms worsen or if the condition fails to improve as anticipated.  I provided 15 minutes of face-to-face time during this encounter.  Sloan Leiter, MD [facility] [place], [place]

## 2019-02-11 NOTE — Progress Notes (Signed)
+  I connected with  [name] on 02/11/19 at  9:35 AM EDT by my chart video and verified that I am speaking with the correct person using two identifiers.   I discussed the limitations, risks, security and privacy concerns of performing an evaluation and management service by telephone and the availability of in person appointments. I also discussed with the patient that there may be a patient responsible charge related to this service. The patient expressed understanding and agreed to proceed.  Aviva Signs Wynne Rozak, CMA 02/11/2019  9:49 AM   Pain x2 weeks and  Getting worse with laughing or coughing, states that went away and it started in her pelvic area. Marland Kitchen

## 2019-02-16 ENCOUNTER — Ambulatory Visit (INDEPENDENT_AMBULATORY_CARE_PROVIDER_SITE_OTHER)
Admission: RE | Admit: 2019-02-16 | Discharge: 2019-02-16 | Disposition: A | Discharge status: Home / Self Care | Payer: Medicaid Other | Source: Ambulatory Visit

## 2019-02-16 DIAGNOSIS — N926 Irregular menstruation, unspecified: Secondary | ICD-10-CM

## 2019-02-16 DIAGNOSIS — R109 Unspecified abdominal pain: Secondary | ICD-10-CM

## 2019-02-16 MED ORDER — NAPROXEN 500 MG PO TABS: 500.0000 mg | ORAL_TABLET | Freq: Two times a day (BID) | ORAL | 0 refills | Status: DC

## 2019-02-16 NOTE — ED Provider Notes (Signed)
Virtual Visit via Video Note:  Melanie Hancock  initiated request for Telemedicine visit with [facility] team. I connected with Melanie Hancock  on 02/16/2019 at 4:16 PM  for a synchronized telemedicine visit using a video enabled HIPPA compliant telemedicine application. I verified that I am speaking with Melanie Hancock  using two identifiers. Raad Clayson C Joliet Mallozzi, PA-C  was physically located in a [facility] Urgent care site and Melanie Hancock was located at a different location.   The limitations of evaluation and management by telemedicine as well as the availability of in-person appointments were discussed. Patient was informed that she  may incur a bill ( including co-pay) for this virtual visit encounter. Melanie Hancock  expressed understanding and gave verbal consent to proceed with virtual visit.     History of Present Illness:Melanie Hancock  is a 23 y.o. female presents with concern over change to her menstrual cycles and abdominal cramping.  Patient states that over the past 2 months she has had menstrual cycles that have been lighter than normal, but have had associated abdominal cramping.  Patient states that in May and early June she had menstrual cycles that involved bleeding for 2 days followed by 2 days of light spotting.  She has had associated menstrual cramping with this.  She has had tender breasts over the past month as well.  She is not on any form of birth control.  She called her OB/GYN who recommended to do a pregnancy test, she has been unable to take with pregnancy test as she states her car is broken down.  She has not been taking anything for her cramping.  She states that her menstrual cycles used to be heavy and last majority of the week.  She is concerned as to why her cycles have changed of recently as they previously were very regular.  She also notes that today she started bleeding again and is approximately 4 to 5 days earlier from when her next menstrual  cycle is expected to start.  Past Medical History:  Diagnosis Date  . Anemia   . Anxiety   . Chronic back pain   . Depression   . Infection    UTI  . Post partum depression   . Pyelonephritis   . UTI (lower urinary tract infection)     No Known Allergies      Observations/Objective: Physical Exam  Constitutional: She is oriented to person, place, and time and well-developed, well-nourished, and in no distress. No distress.  HENT:  Head: Normocephalic and atraumatic.  Eyes: Conjunctivae are normal.  Neck: Normal range of motion.  Pulmonary/Chest: Effort normal. No respiratory distress.  Speaking in full sentences  Musculoskeletal:     Comments: Moving extremities appropriately  Neurological: She is alert and oriented to person, place, and time.  Speech clear     Assessment and Plan: Recommended patient to take at home pregnancy test.  She expressed concern is when she is previously been pregnant she states that she has required blood tests as urine pregnancy test had not detected pregnancy.  Advised that she may come in and be seen in person in order to have urine pregnancy test.  Recommended Naprosyn to use for cramping, continue to monitor her menstrual cycle.  Unclear reason at this time as to why her cycles are not as heavy as they typically are.  Advised to follow-up with OB/GYN for further evaluation of persistent tender breasts and irregular  menstrual cycles.  Follow Up Instructions:    I discussed the assessment and treatment plan with the patient. The patient was provided an opportunity to ask questions and all were answered. The patient agreed with the plan and demonstrated an understanding of the instructions.   The patient was advised to call back or seek an in-person evaluation if the symptoms worsen or if the condition fails to improve as anticipated.      Lew DawesHallie C Arantza Darrington, PA-C  02/16/2019 4:16 PM        Patterson HammersmithWieters, Daleiza Bacchi C, PA-C 02/16/19  1620

## 2019-02-16 NOTE — Discharge Instructions (Signed)
Naprosyn twice daily for cramping May follow up in person for pregnancy test Please follow-up with OB/GYN for further evaluation of changes to her menstrual cycle

## 2019-02-24 ENCOUNTER — Inpatient Hospital Stay (EMERGENCY_DEPARTMENT_HOSPITAL)
Admission: AD | Admit: 2019-02-24 | Discharge: 2019-02-24 | Disposition: A | Discharge status: Home / Self Care | Payer: Medicaid Other | Source: Home / Self Care | Attending: Emergency Medicine | Admitting: Emergency Medicine

## 2019-02-24 ENCOUNTER — Other Ambulatory Visit: Payer: Self-pay

## 2019-02-24 ENCOUNTER — Emergency Department (HOSPITAL_COMMUNITY)
Admission: EM | Admit: 2019-02-24 | Discharge: 2019-02-24 | Disposition: A | Discharge status: Home / Self Care | Payer: Medicaid Other | Attending: Emergency Medicine | Admitting: Emergency Medicine

## 2019-02-24 DIAGNOSIS — R102 Pelvic and perineal pain: Secondary | ICD-10-CM | POA: Insufficient documentation

## 2019-02-24 DIAGNOSIS — Z87891 Personal history of nicotine dependence: Secondary | ICD-10-CM | POA: Diagnosis not present

## 2019-02-24 LAB — COMPREHENSIVE METABOLIC PANEL
ALT: 19 U/L (ref 0–44)
AST: 19 U/L (ref 15–41)
Albumin: 3.8 g/dL (ref 3.5–5.0)
Alkaline Phosphatase: 60 U/L (ref 38–126)
Anion gap: 7 (ref 5–15)
BUN: 10 mg/dL (ref 6–20)
CO2: 23 mmol/L (ref 22–32)
Calcium: 8.9 mg/dL (ref 8.9–10.3)
Chloride: 108 mmol/L (ref 98–111)
Creatinine, Ser: 0.87 mg/dL (ref 0.44–1.00)
GFR calc Af Amer: >60 mL/min (ref >60)
GFR calc non Af Amer: >60 mL/min (ref >60)
Glucose, Bld: 94 mg/dL (ref 70–99)
Potassium: 3.9 mmol/L (ref 3.5–5.1)
Sodium: 138 mmol/L (ref 135–145)
Total Bilirubin: 0.5 mg/dL (ref 0.3–1.2)
Total Protein: 6.8 g/dL (ref 6.5–8.1)

## 2019-02-24 LAB — CBC
HCT: 35.1 % — ABNORMAL LOW (ref 36.0–46.0)
Hemoglobin: 11.4 g/dL — ABNORMAL LOW (ref 12.0–15.0)
MCH: 29.8 pg (ref 26.0–34.0)
MCHC: 32.5 g/dL (ref 30.0–36.0)
MCV: 91.9 fL (ref 80.0–100.0)
Platelets: 260 10*3/uL (ref 150–400)
RBC: 3.82 MIL/uL — ABNORMAL LOW (ref 3.87–5.11)
RDW: 11.9 % (ref 11.5–15.5)
WBC: 5.1 10*3/uL (ref 4.0–10.5)
nRBC: 0 % (ref 0.0–0.2)

## 2019-02-24 LAB — URINALYSIS, ROUTINE W REFLEX MICROSCOPIC
Bilirubin Urine: NEGATIVE
Glucose, UA: NEGATIVE mg/dL
Hgb urine dipstick: NEGATIVE
Ketones, ur: NEGATIVE mg/dL
Leukocytes,Ua: NEGATIVE
Nitrite: NEGATIVE
Protein, ur: NEGATIVE mg/dL
Specific Gravity, Urine: 1.024 (ref 1.005–1.030)
pH: 7 (ref 5.0–8.0)

## 2019-02-24 LAB — WET PREP, GENITAL
Trich, Wet Prep: NONE SEEN
Yeast Wet Prep HPF POC: NONE SEEN

## 2019-02-24 LAB — I-STAT BETA HCG BLOOD, ED (MC, WL, AP ONLY): I-stat hCG, quantitative: <5 m[IU]/mL (ref <5)

## 2019-02-24 LAB — POCT PREGNANCY, URINE: Preg Test, Ur: NEGATIVE

## 2019-02-24 LAB — LIPASE, BLOOD: Lipase: 24 U/L (ref 11–51)

## 2019-02-24 MED ORDER — SODIUM CHLORIDE 0.9% FLUSH: 3.0000 mL | Freq: Once | INTRAVENOUS | Status: DC

## 2019-02-24 NOTE — Discharge Instructions (Signed)
Please read attached information. If you experience any new or worsening signs or symptoms please return to the emergency room for evaluation. Please follow-up with your primary care provider or specialist as discussed. Please use medication prescribed only as directed and discontinue taking if you have any concerning signs or symptoms.   °

## 2019-02-24 NOTE — ED Notes (Signed)
Pt was asked for urine specimen and is unable to urinate at the time.

## 2019-02-24 NOTE — ED Notes (Signed)
ED Provider at bedside. 

## 2019-02-24 NOTE — MAU Provider Note (Signed)
First Provider Initiated Contact with Patient 02/24/19 1145      S [name] is a 23 y.o. [phone] non-pregnant female who presents to MAU today with complaint of abdominal pain and breast tenderness. She states she has taken pregnancy tests, but they have been negative.  Patient states she needs a "blood test."  She further expresses understanding that her pain may just be a gynecological problem, but has been unable to schedule an appt.   O BP 109/63 (BP Location: Right Arm)   Pulse (!) 54   Temp 98.3 F (36.8 C) (Oral)   Resp 16   Ht 5' 5.5" (1.664 m)   Wt 70.9 kg   LMP 02/15/2019   SpO2 100%   BMI 25.61 kg/m    Physical Exam  Constitutional: She is oriented to person, place, and time. She appears well-developed and well-nourished.  HENT:  Head: Normocephalic and atraumatic.  Eyes: Conjunctivae are normal.  Neck: Normal range of motion.  Cardiovascular: Normal rate.  Respiratory: Effort normal.  Musculoskeletal: Normal range of motion.  Neurological: She is alert and oriented to person, place, and time.    A Non pregnant female Medical screening exam complete Abdominal Pain  P: -Patient informed of MAU evaluation criteria. -Patient given the option of transfer to [facility] for further evaluation or seek care in outpatient facility of choice. -Patient would like transfer. -Dr. Maryan Rued from ER contacted and notified of patient transfer. -Transferred to ER in stable condition  Gavin Pound, CNM 02/24/2019 11:48 AM

## 2019-02-24 NOTE — MAU Note (Signed)
Been having stomach pains for about a month.  irreg bleeding in June.  Breasts have been sore since end of May.  Was supposed to have come on the 4th of July.  Came a wk early, was really heavy, had bad pain in back and cramping.  Has not done a home test. Home tests don't work. Pain was worse this morning.

## 2019-02-24 NOTE — ED Notes (Signed)
Pt informed of need for urine sample unable to provide one at this time.

## 2019-02-24 NOTE — ED Provider Notes (Signed)
[place] EMERGENCY DEPARTMENT Provider Note   CSN: [id number] Arrival date & time: 02/24/19  1027    History   Chief Complaint Chief Complaint  Patient presents with  . Abdominal Pain    HPI ANYI FELS is a 23 y.o. female.     HPI   23 year old female presents today with complaints of pelvic pain and questionable pregnancy.  Patient notes her last menstrual cycle started approximately 9 days ago.  She notes this was earlier than normal.  She notes since that time no vaginal bleeding or discharge.  She notes for the last month she has had intermittent low pelvic pain coming and going cramping in nature.  She denies any abdominal pain nausea vomiting or fever.  She also notes breast tenderness.  She reports that previous urine pregnancy test at home were negative but is concerned that she is pregnant.  She notes she is sexually active.   Past Medical History:  Diagnosis Date  . Anemia   . Anxiety   . Chronic back pain   . Depression   . Infection    UTI  . Post partum depression   . Pyelonephritis   . UTI (lower urinary tract infection)     Patient Active Problem List   Diagnosis Date Noted  . Vaginal delivery 02/04/2016  . History of postpartum depression, currently pregnant 01/23/2016  . Iron deficiency anemia 01/02/2016  . 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 Trichomoniasis 06/17/2015  . Anxiety 03/28/2014  . Chronic back pain 03/28/2014  . Hx pyelonephritis 2014 05/26/2013    Past Surgical History:  Procedure Laterality Date  . DILATION AND CURETTAGE OF UTERUS    . INDUCED ABORTION    . WISDOM TOOTH EXTRACTION       OB History    Gravida  4   Para  3   Term  3   Preterm      AB  1   Living  3     SAB  0   TAB  1   Ectopic      Multiple  0   Live Births  3            Home Medications    Prior to Admission medications   Not on File    Family History Family History  Problem  Relation Age of Onset  . Arthritis Mother   . Arthritis Maternal Grandmother   . Asthma Son   . Alcohol abuse Neg Hx     Social History Social History   Tobacco Use  . Smoking status: Former Smoker    Quit date: 01/21/2014    Years since quitting: 5.0  . Smokeless tobacco: Never Used  Substance Use Topics  . Alcohol use: No  . Drug use: No     Allergies   Patient has no known allergies.   Review of Systems Review of Systems  All other systems reviewed and are negative.   Physical Exam Updated Vital Signs BP (!) 104/59 (BP Location: Right Arm)   Pulse (!) 50   Temp 98.3 F (36.8 C) (Oral)   Resp 15   Ht 5' 5.5" (1.664 m)   Wt 70.9 kg   LMP 02/15/2019 Comment: period lasted until 02-21-19   SpO2 100%   BMI 25.61 kg/m   Physical Exam Vitals signs and nursing note reviewed.  Constitutional:      Appearance: She is well-developed.  HENT:  Head: Normocephalic and atraumatic.  Eyes:     General: No scleral icterus.       Right eye: No discharge.        Left eye: No discharge.     Conjunctiva/sclera: Conjunctivae normal.     Pupils: Pupils are equal, round, and reactive to light.  Neck:     Musculoskeletal: Normal range of motion.     Vascular: No JVD.     Trachea: No tracheal deviation.  Pulmonary:     Effort: Pulmonary effort is normal.     Breath sounds: No stridor.  Abdominal:     General: There is no distension.     Palpations: Abdomen is soft. There is no mass.     Tenderness: There is no abdominal tenderness. There is no guarding or rebound.     Hernia: No hernia is present.  Genitourinary:    Comments: Normal physiological vaginal discharge, no purulence, no cervical motion tenderness, no adnexal tenderness or masses Neurological:     Mental Status: She is alert and oriented to person, place, and time.     Coordination: Coordination normal.  Psychiatric:        Behavior: Behavior normal.        Thought Content: Thought content normal.         Judgment: Judgment normal.      ED Treatments / Results  Labs (all labs ordered are listed, but only abnormal results are displayed) Labs Reviewed  WET PREP, GENITAL - Abnormal; Notable for the following components:      Result Value   Clue Cells Wet Prep HPF POC PRESENT (*)    WBC, Wet Prep HPF POC MODERATE (*)    All other components within normal limits  CBC - Abnormal; Notable for the following components:   RBC 3.82 (*)    Hemoglobin 11.4 (*)    HCT 35.1 (*)    All other components within normal limits  LIPASE, BLOOD  COMPREHENSIVE METABOLIC PANEL  URINALYSIS, ROUTINE W REFLEX MICROSCOPIC  POCT PREGNANCY, URINE  I-STAT BETA HCG BLOOD, ED ([facility], [facility], [facility] ONLY)  GC/CHLAMYDIA PROBE AMP ([place]    EKG None  Radiology No results found.  Procedures Procedures (including critical care time)  Medications Ordered in ED Medications  sodium chloride flush (NS) 0.9 % injection 3 mL (0 mLs Intravenous Hold 02/24/19 1318)     Initial Impression / Assessment and Plan / ED Course  I have reviewed the triage vital signs and the nursing notes.  Pertinent labs & imaging results that were available during my care of the patient were reviewed by me and considered in my medical decision making (see chart for details).       Labs: Urine pregnancy, GC, wet prep  Imaging:  Consults:  Therapeutics:  Discharge Meds:   Assessment/Plan: 23 year old female presents today with pain.  She is in no acute distress presently.  Low suspicion for pelvic infection, torsion, or any other life-threatening etiology.  She has a negative pregnancy test.  She is encouraged follow-up as an outpatient with OB/GYN, strict return precautions given.  She verbalized understanding and agreement to today's plan had no further questions or concerns at time of discharge.     Final Clinical Impressions(s) / ED Diagnoses   Final diagnoses:  Pelvic pain    ED Discharge Orders    None        Rosalio LoudHedges, Masaki Rothbauer, PA-C 02/24/19 1715    Gwyneth SproutPlunkett, Whitney, MD 02/27/19  2115  

## 2019-02-24 NOTE — ED Triage Notes (Signed)
Pt sent from [facility] for abd cramping, irregular periods, breast tenderness. Negative urine preg.Pt states that she needs blood pregnancy test.  Pt statedLMP 6-28-20ending 02-21-19, had use maxi pad every 1-2 hours during this period.

## 2019-02-25 LAB — GC/CHLAMYDIA PROBE AMP (~~LOC~~) NOT AT ARMC
Chlamydia: NEGATIVE
Neisseria Gonorrhea: NEGATIVE

## 2019-04-22 ENCOUNTER — Ambulatory Visit (HOSPITAL_COMMUNITY)
Admission: EM | Admit: 2019-04-22 | Discharge: 2019-04-22 | Disposition: A | Discharge status: Home / Self Care | Payer: Medicaid Other | Attending: Emergency Medicine | Admitting: Emergency Medicine

## 2019-04-22 ENCOUNTER — Encounter (HOSPITAL_COMMUNITY): Payer: Self-pay

## 2019-04-22 ENCOUNTER — Other Ambulatory Visit: Payer: Self-pay

## 2019-04-22 DIAGNOSIS — Z202 Contact with and (suspected) exposure to infections with a predominantly sexual mode of transmission: Secondary | ICD-10-CM | POA: Diagnosis present

## 2019-04-22 DIAGNOSIS — N898 Other specified noninflammatory disorders of vagina: Secondary | ICD-10-CM

## 2019-04-22 MED ORDER — CEFTRIAXONE SODIUM 250 MG IJ SOLR
250.0000 mg | Freq: Once | INTRAMUSCULAR | Status: AC
  Administered 2019-04-22: 250 mg via INTRAMUSCULAR

## 2019-04-22 MED ORDER — CEFTRIAXONE SODIUM 250 MG IJ SOLR
INTRAMUSCULAR | Status: AC
  Filled 2019-04-22: qty 250

## 2019-04-22 MED ORDER — METRONIDAZOLE 500 MG PO TABS: 500.0000 mg | ORAL_TABLET | Freq: Two times a day (BID) | ORAL | 0 refills | Status: DC

## 2019-04-22 MED ORDER — AZITHROMYCIN 250 MG PO TABS
1000.0000 mg | ORAL_TABLET | Freq: Once | ORAL | Status: AC
  Administered 2019-04-22: 1000 mg via ORAL

## 2019-04-22 MED ORDER — AZITHROMYCIN 250 MG PO TABS
ORAL_TABLET | ORAL | Status: AC
  Filled 2019-04-22: qty 4

## 2019-04-22 NOTE — ED Provider Notes (Signed)
[facility]    CSN: [id number] Arrival date & time: 04/22/19  1526      History   Chief Complaint Chief Complaint  Patient presents with  . Appointment    3:30  . SEXUALLY TRANSMITTED DISEASE    HPI Melanie Hancock is a 23 y.o. female.   Patient presents with 3-day history of thin white vaginal discharge.  She has a history of trichomoniasis, BV, and herpes.  She request STD testing and treatment today.  She denies other symptoms, including abdominal pain, dysuria, back pain, pelvic pain, fever, chills.  She is sexually active and does not consistently use condoms.  LMP: 1 week.  The history is provided by the patient.    Past Medical History:  Diagnosis Date  . Anemia   . Anxiety   . Chronic back pain   . Depression   . Infection    UTI  . Post partum depression   . Pyelonephritis   . UTI (lower urinary tract infection)     Patient Active Problem List   Diagnosis Date Noted  . Vaginal delivery 02/04/2016  . History of postpartum depression, currently pregnant 01/23/2016  . Iron deficiency anemia 01/02/2016  . 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 Trichomoniasis 06/17/2015  . Anxiety 03/28/2014  . Chronic back pain 03/28/2014  . Hx pyelonephritis 2014 05/26/2013    Past Surgical History:  Procedure Laterality Date  . DILATION AND CURETTAGE OF UTERUS    . INDUCED ABORTION    . WISDOM TOOTH EXTRACTION      OB History    Gravida  4   Para  3   Term  3   Preterm      AB  1   Living  3     SAB  0   TAB  1   Ectopic      Multiple  0   Live Births  3            Home Medications    Prior to Admission medications   Medication Sig Start Date End Date Taking? Authorizing Provider  metroNIDAZOLE (FLAGYL) 500 MG tablet Take 1 tablet (500 mg total) by mouth 2 (two) times daily. 04/22/19   Mickie Bail, NP    Family History Family History  Problem Relation Age of Onset  . Arthritis Mother   . Healthy  Father   . Arthritis Maternal Grandmother   . Asthma Son   . Alcohol abuse Neg Hx     Social History Social History   Tobacco Use  . Smoking status: Former Smoker    Quit date: 01/21/2014    Years since quitting: 5.2  . Smokeless tobacco: Never Used  Substance Use Topics  . Alcohol use: No    Comment: rarely  . Drug use: No     Allergies   Patient has no known allergies.   Review of Systems Review of Systems  Constitutional: Negative for chills and fever.  HENT: Negative for ear pain and sore throat.   Eyes: Negative for pain and visual disturbance.  Respiratory: Negative for cough and shortness of breath.   Cardiovascular: Negative for chest pain and palpitations.  Gastrointestinal: Negative for abdominal pain and vomiting.  Genitourinary: Positive for vaginal discharge. Negative for dysuria, flank pain, hematuria and pelvic pain.  Musculoskeletal: Negative for arthralgias and back pain.  Skin: Negative for color change and rash.  Neurological: Negative for seizures and syncope.  All  other systems reviewed and are negative.    Physical Exam Triage Vital Signs ED Triage Vitals  Enc Vitals Group     BP      Pulse      Resp      Temp      Temp src      SpO2      Weight      Height      Head Circumference      Peak Flow      Pain Score      Pain Loc      Pain Edu?      Excl. in [place]?    No data found.  Updated Vital Signs BP (!) 97/55 (BP Location: Left Arm)   Pulse 69   Temp 98.2 F (36.8 C) (Oral)   Resp 16   SpO2 99%   Visual Acuity Right Eye Distance:   Left Eye Distance:   Bilateral Distance:    Right Eye Near:   Left Eye Near:    Bilateral Near:     Physical Exam Vitals signs and nursing note reviewed.  Constitutional:      General: She is not in acute distress.    Appearance: She is well-developed.  HENT:     Head: Normocephalic and atraumatic.     Mouth/Throat:     Mouth: Mucous membranes are moist.     Pharynx: Oropharynx is  clear.  Eyes:     Conjunctiva/sclera: Conjunctivae normal.  Neck:     Musculoskeletal: Neck supple.  Cardiovascular:     Rate and Rhythm: Normal rate and regular rhythm.     Heart sounds: No murmur.  Pulmonary:     Effort: Pulmonary effort is normal. No respiratory distress.     Breath sounds: Normal breath sounds.  Abdominal:     General: Bowel sounds are normal.     Palpations: Abdomen is soft.     Tenderness: There is no abdominal tenderness. There is no right CVA tenderness, left CVA tenderness, guarding or rebound.  Skin:    General: Skin is warm and dry.     Findings: No rash.  Neurological:     Mental Status: She is alert.      UC Treatments / Results  Labs (all labs ordered are listed, but only abnormal results are displayed) Labs Reviewed  CERVICOVAGINAL ANCILLARY ONLY    EKG   Radiology No results found.  Procedures Procedures (including critical care time)  Medications Ordered in UC Medications  azithromycin (ZITHROMAX) tablet 1,000 mg (has no administration in time range)  cefTRIAXone (ROCEPHIN) injection 250 mg (has no administration in time range)    Initial Impression / Assessment and Plan / UC Course  I have reviewed the triage vital signs and the nursing notes.  Pertinent labs & imaging results that were available during my care of the patient were reviewed by me and considered in my medical decision making (see chart for details).    Vaginal discharge, potential exposure to STD.  Treated today with Rocephin, Zithromax, metronidazole.  Instructed patient not to have sex for 7 days.  Discussed with patient that her STD tests are pending and that we will call her if her results are positive.  Discussed that she may need additional treatment at that time and her partner may need treatment.  Patient agrees with plan of care.     Final Clinical Impressions(s) / UC Diagnoses   Final diagnoses:  Vaginal discharge  Potential exposure  to STD      Discharge Instructions     You were treated today with 2 antibiotics, Rocephin and Zithromax.  Additionally you should take the prescribed metronidazole twice a day for 7 days.  Do not have sex for 7 days.    Your STD tests are pending.  If your test results are positive, we will call you.  You may need additional treatment and your partner may also need treatment.         ED Prescriptions    Medication Sig Dispense Auth. Provider   metroNIDAZOLE (FLAGYL) 500 MG tablet Take 1 tablet (500 mg total) by mouth 2 (two) times daily. 14 tablet Mickie Bailate, Cherilynn Schomburg H, NP     Controlled Substance Prescriptions  Controlled Substance Registry consulted? Not Applicable   Mickie Bailate, Franny Selvage H, NP 04/22/19 1643

## 2019-04-22 NOTE — ED Triage Notes (Signed)
Patient presents to Urgent Care with complaints of lower abdominal pain and vaginal discharge since 2-3 days ago. Patient reports she has been having unprotected intercourse.

## 2019-04-22 NOTE — Discharge Instructions (Addendum)
You were treated today with 2 antibiotics, Rocephin and Zithromax.  Additionally you should take the prescribed metronidazole twice a day for 7 days.  Do not have sex for 7 days.    Your STD tests are pending.  If your test results are positive, we will call you.  You may need additional treatment and your partner may also need treatment.

## 2019-04-24 LAB — CERVICOVAGINAL ANCILLARY ONLY
Bacterial vaginitis: POSITIVE — AB
Candida vaginitis: NEGATIVE
Chlamydia: NEGATIVE
Neisseria Gonorrhea: NEGATIVE
Trichomonas: NEGATIVE

## 2019-04-29 ENCOUNTER — Telehealth: Payer: Self-pay | Admitting: General Practice

## 2019-04-29 NOTE — Telephone Encounter (Signed)
Patient called & left message on nurse voicemail line requesting a nurse to call her back. Called patient and she states she had a normal LMP the end of August but on Sunday she started spotting. Patient denies being on birth control. Discussed with patient recent STD testing was negative which is reassuring. Advised she take a pregnancy test and as long as that is negative, I would just keep an eye on it. Discussed sometimes women spot midway through cycle and we can't necessarily say why but to keep an eye on it and let us know if it continues. Patient verbalized understanding & had no questions.

## 2019-05-14 ENCOUNTER — Emergency Department (HOSPITAL_COMMUNITY)
Admission: EM | Admit: 2019-05-14 | Discharge: 2019-05-14 | Disposition: A | Discharge status: Home / Self Care | Payer: Medicaid Other | Attending: Emergency Medicine | Admitting: Emergency Medicine

## 2019-05-14 ENCOUNTER — Encounter (HOSPITAL_COMMUNITY): Payer: Self-pay | Admitting: Emergency Medicine

## 2019-05-14 ENCOUNTER — Other Ambulatory Visit: Payer: Self-pay

## 2019-05-14 DIAGNOSIS — R109 Unspecified abdominal pain: Secondary | ICD-10-CM | POA: Insufficient documentation

## 2019-05-14 DIAGNOSIS — R350 Frequency of micturition: Secondary | ICD-10-CM | POA: Diagnosis not present

## 2019-05-14 DIAGNOSIS — Z5321 Procedure and treatment not carried out due to patient leaving prior to being seen by health care provider: Secondary | ICD-10-CM | POA: Insufficient documentation

## 2019-05-14 NOTE — ED Notes (Signed)
Pt gave labels to registration and left 

## 2019-05-14 NOTE — ED Triage Notes (Signed)
Patient c/o left side abdominal pain since 1300. Denies N/V/D. Reports urinary frequency.

## 2019-05-14 NOTE — ED Notes (Signed)
Pt. Refused to enter consult room to get labs drawn.

## 2019-05-27 ENCOUNTER — Encounter (HOSPITAL_COMMUNITY): Payer: Self-pay

## 2019-05-27 ENCOUNTER — Ambulatory Visit (HOSPITAL_COMMUNITY)
Admission: EM | Admit: 2019-05-27 | Discharge: 2019-05-27 | Disposition: A | Discharge status: Home / Self Care | Payer: Medicaid Other | Attending: Family Medicine | Admitting: Family Medicine

## 2019-05-27 ENCOUNTER — Other Ambulatory Visit: Payer: Self-pay

## 2019-05-27 DIAGNOSIS — N926 Irregular menstruation, unspecified: Secondary | ICD-10-CM | POA: Insufficient documentation

## 2019-05-27 DIAGNOSIS — R350 Frequency of micturition: Secondary | ICD-10-CM | POA: Diagnosis not present

## 2019-05-27 DIAGNOSIS — N898 Other specified noninflammatory disorders of vagina: Secondary | ICD-10-CM

## 2019-05-27 DIAGNOSIS — Z113 Encounter for screening for infections with a predominantly sexual mode of transmission: Secondary | ICD-10-CM | POA: Diagnosis present

## 2019-05-27 LAB — POCT URINALYSIS DIP (DEVICE)
Bilirubin Urine: NEGATIVE
Glucose, UA: NEGATIVE mg/dL
Hgb urine dipstick: NEGATIVE
Ketones, ur: NEGATIVE mg/dL
Leukocytes,Ua: NEGATIVE
Nitrite: NEGATIVE
Protein, ur: 30 mg/dL — AB
Specific Gravity, Urine: 1.025 (ref 1.005–1.030)
Urobilinogen, UA: 0.2 mg/dL (ref 0.0–1.0)
pH: 7 (ref 5.0–8.0)

## 2019-05-27 LAB — POCT PREGNANCY, URINE: Preg Test, Ur: NEGATIVE

## 2019-05-27 NOTE — ED Triage Notes (Signed)
Patient presents to Urgent Care with complaints of urinary frequency since 4 days ago. Patient reports she thinks she has a UTI, endorses unprotected intercourse (possible STD), has had irregular periods as well, reports 3 periods in the last month and a half.

## 2019-05-27 NOTE — ED Provider Notes (Signed)
[facility]   [id number] 05/27/19 Arrival Time: 1341  ASSESSMENT & PLAN:  1. Urinary frequency   2. Screening for STDs (sexually transmitted diseases)   3. Irregular periods/menstrual cycles   4. Vaginal discharge     Declines empiric GC/chlamydia treatment. No s/s of PID. Has Rx at pharmacy to empirically treat BV. Prefers to begin with this. UPT negative. U/A without signs of infection.   Discharge Instructions     We have sent testing for sexually transmitted infections. We will notify you of any positive results once they are received. If required, we will prescribe any medications you might need.  Please refrain from all sexual activity for at least the next seven days.      Pending: Labs Reviewed  POC URINE PREG, ED  CERVICOVAGINAL ANCILLARY ONLY    Will notify of any positive results. Instructed to refrain from sexual activity for at least seven days.  Reviewed expectations re: course of current medical issues. Questions answered. Outlined signs and symptoms indicating need for more acute intervention. Patient verbalized understanding. After Visit Summary given.   SUBJECTIVE:  Melanie Hancock is a 23 y.o. female who presents with complaint of urinary frequency without dysuria. Onset gradual. First noticed 3-4 d ago. Not worsening. "Comes and goes". Questions mild vaginal discharge; "creamy and a little white". Afebrile. No abdominal or pelvic pain. Normal PO intake wihout n/v. No rashes or lesions. Reports that she is sexually active with single female partner; unprotected. OTC treatment: none. Reports h/o BV. No h/o frequent UTIs reported.  Patient's last menstrual period was 05/11/2019 (exact date). But she is unsure. Reports "I think I had about three periods in the past month or two."  ROS: As per HPI. All other systems negative.   OBJECTIVE:  Vitals:   05/27/19 1349  BP: (!) 123/55  Pulse: 75  Resp: 16  Temp: 98.3 F (36.8 C)  TempSrc:  Oral  SpO2: 100%     General appearance: alert, cooperative, appears stated age and no distress Throat: lips, mucosa, and tongue normal; teeth and gums normal CV: RRR Lungs: CTAB Back: no CVA tenderness; FROM at waist Abdomen: soft, non-tender GU: deferred Skin: warm and dry Neuro: normal gait Psychological: alert and cooperative; normal mood and affect   Labs Reviewed  POC URINE PREG, ED  CERVICOVAGINAL ANCILLARY ONLY    No Known Allergies  Past Medical History:  Diagnosis Date  . Anemia   . Anxiety   . Chronic back pain   . Depression   . Infection    UTI  . Post partum depression   . Pyelonephritis   . UTI (lower urinary tract infection)    Family History  Problem Relation Age of Onset  . Arthritis Mother   . Healthy Father   . Arthritis Maternal Grandmother   . Asthma Son   . Alcohol abuse Neg Hx    Social History   Socioeconomic History  . Marital status: Single    Spouse name: Not on file  . Number of children: Not on file  . Years of education: Not on file  . Highest education level: Not on file  Occupational History  . Not on file  Social Needs  . Financial resource strain: Not on file  . Food insecurity    Worry: Not on file    Inability: Not on file  . Transportation needs    Medical: Not on file    Non-medical: Not on file  Tobacco Use  .  Smoking status: Former Smoker    Quit date: 01/21/2014    Years since quitting: 5.3  . Smokeless tobacco: Never Used  Substance and Sexual Activity  . Alcohol use: Yes    Comment: rarely  . Drug use: No  . Sexual activity: Yes    Birth control/protection: None  Lifestyle  . Physical activity    Days per week: Not on file    Minutes per session: Not on file  . Stress: Not on file  Relationships  . Social Herbalist on phone: Not on file    Gets together: Not on file    Attends religious service: Not on file    Active member of club or organization: Not on file    Attends meetings of  clubs or organizations: Not on file    Relationship status: Not on file  . Intimate partner violence    Fear of current or ex partner: Not on file    Emotionally abused: Not on file    Physically abused: Not on file    Forced sexual activity: Not on file  Other Topics Concern  . Not on file  Social History Narrative  . Not on file          Vanessa Kick, MD 05/27/19 [phone]

## 2019-05-27 NOTE — Discharge Instructions (Signed)
We have sent testing for sexually transmitted infections. We will notify you of any positive results once they are received. If required, we will prescribe any medications you might need.  Please refrain from all sexual activity for at least the next seven days.  

## 2019-05-29 LAB — CERVICOVAGINAL ANCILLARY ONLY
Bacterial vaginitis: POSITIVE — AB
Candida vaginitis: NEGATIVE
Chlamydia: NEGATIVE
Neisseria Gonorrhea: NEGATIVE
Trichomonas: NEGATIVE

## 2019-06-01 ENCOUNTER — Encounter (HOSPITAL_COMMUNITY): Payer: Self-pay

## 2019-06-01 ENCOUNTER — Telehealth (HOSPITAL_COMMUNITY): Payer: Self-pay | Admitting: Emergency Medicine

## 2019-06-01 MED ORDER — METRONIDAZOLE 500 MG PO TABS: 500.0000 mg | ORAL_TABLET | Freq: Two times a day (BID) | ORAL | 0 refills | Status: DC

## 2019-06-01 NOTE — Telephone Encounter (Signed)
Bacterial vaginosis is positive. This was not treated at the urgent care visit.  Flagyl 500 mg BID x 7 days #14 no refills sent to patients pharmacy of choice.    Attempted to reach patient. No answer at this time. Call Cannot be completed at this time.   Treatment sent to [facility] on [facility]

## 2019-06-02 ENCOUNTER — Telehealth (HOSPITAL_COMMUNITY): Payer: Self-pay | Admitting: Emergency Medicine

## 2019-06-02 NOTE — Telephone Encounter (Signed)
Patient contacted and made aware of  bv  results, all questions answered   

## 2019-06-05 ENCOUNTER — Encounter (HOSPITAL_COMMUNITY): Payer: Self-pay

## 2019-06-05 ENCOUNTER — Ambulatory Visit (HOSPITAL_COMMUNITY)
Admission: EM | Admit: 2019-06-05 | Discharge: 2019-06-05 | Disposition: A | Discharge status: Home / Self Care | Payer: Medicaid Other | Attending: Family Medicine | Admitting: Family Medicine

## 2019-06-05 ENCOUNTER — Other Ambulatory Visit: Payer: Self-pay

## 2019-06-05 DIAGNOSIS — H01131 Eczematous dermatitis of right upper eyelid: Secondary | ICD-10-CM | POA: Diagnosis not present

## 2019-06-05 DIAGNOSIS — H01134 Eczematous dermatitis of left upper eyelid: Secondary | ICD-10-CM | POA: Diagnosis not present

## 2019-06-05 MED ORDER — NEOMYCIN-POLYMYXIN-DEXAMETH 3.5-10000-0.1 OP SUSP: 1.0000 [drp] | OPHTHALMIC | 0 refills | Status: AC

## 2019-06-05 NOTE — ED Provider Notes (Signed)
[facility]    CSN: [id number] Arrival date & time: 06/05/19  1514      History   Chief Complaint No chief complaint on file.   HPI Melanie Hancock is a 23 y.o. female.   HPI  Patient is here complaining of eye irritation from wearing false eyelashes.  She took the lashes off and she has been using refresh saline drops.  This helps, but she is waking up every morning with crusting around her lids.  Her upper lids are still red and irritated.  Slightly cracked.  No visual defect.  Past Medical History:  Diagnosis Date  . Anemia   . Anxiety   . Chronic back pain   . Depression   . Infection    UTI  . Post partum depression   . Pyelonephritis   . UTI (lower urinary tract infection)     Patient Active Problem List   Diagnosis Date Noted  . Vaginal delivery 02/04/2016  . History of postpartum depression, currently pregnant 01/23/2016  . Iron deficiency anemia 01/02/2016  . 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 Trichomoniasis 06/17/2015  . Anxiety 03/28/2014  . Chronic back pain 03/28/2014  . Hx pyelonephritis 2014 05/26/2013    Past Surgical History:  Procedure Laterality Date  . DILATION AND CURETTAGE OF UTERUS    . INDUCED ABORTION    . WISDOM TOOTH EXTRACTION      OB History    Gravida  4   Para  3   Term  3   Preterm      AB  1   Living  3     SAB  0   TAB  1   Ectopic      Multiple  0   Live Births  3            Home Medications    Prior to Admission medications   Medication Sig Start Date End Date Taking? Authorizing Provider  neomycin-polymyxin b-dexamethasone (MAXITROL) 3.5-10000-0.1 SUSP Place 1 drop into both eyes every 4 (four) hours for 5 days. 06/05/19 06/10/19  Raylene Everts, MD    Family History Family History  Problem Relation Age of Onset  . Arthritis Mother   . Healthy Father   . Arthritis Maternal Grandmother   . Asthma Son   . Alcohol abuse Neg Hx     Social History  Social History   Tobacco Use  . Smoking status: Former Smoker    Quit date: 01/21/2014    Years since quitting: 5.3  . Smokeless tobacco: Never Used  Substance Use Topics  . Alcohol use: Yes    Comment: rarely  . Drug use: No     Allergies   Patient has no known allergies.   Review of Systems Review of Systems  Constitutional: Negative for chills and fever.  HENT: Negative for ear pain and sore throat.   Eyes: Positive for discharge and itching. Negative for pain and visual disturbance.  Respiratory: Negative for cough and shortness of breath.   Cardiovascular: Negative for chest pain and palpitations.  Gastrointestinal: Negative for abdominal pain and vomiting.  Genitourinary: Negative for dysuria and hematuria.  Musculoskeletal: Negative for arthralgias and back pain.  Skin: Negative for color change and rash.  Neurological: Negative for seizures and syncope.  All other systems reviewed and are negative.    Physical Exam Triage Vital Signs ED Triage Vitals  Enc Vitals Group     BP  06/05/19 1545 (!) 110/58     Pulse Rate 06/05/19 1545 61     Resp 06/05/19 1545 16     Temp 06/05/19 1545 98.4 F (36.9 C)     Temp Source 06/05/19 1545 Oral     SpO2 06/05/19 1545 100 %     Weight --      Height --      Head Circumference --      Peak Flow --      Pain Score 06/05/19 1549 8     Pain Loc --      Pain Edu? --      Excl. in GC? --    No data found.  Updated Vital Signs BP (!) 110/58 (BP Location: Left Arm)   Pulse 61   Temp 98.4 F (36.9 C) (Oral)   Resp 16   LMP 05/11/2019 (Exact Date)   SpO2 100%   Visual Acuity Right Eye Distance: 20/20 Left Eye Distance: 20/25 Bilateral Distance: 20/20  Physical Exam Constitutional:      General: She is not in acute distress.    Appearance: She is well-developed.  HENT:     Head: Normocephalic and atraumatic.  Eyes:     General:        Right eye: No discharge.        Left eye: No discharge.     Extraocular  Movements: Extraocular movements intact.     Conjunctiva/sclera: Conjunctivae normal.     Pupils: Pupils are equal, round, and reactive to light.     Comments: Upper lids have some redness and slight swelling at the lash line.  Conjunctive are clear.  No discharge is seen.  Visual acuity is normal.  Neck:     Musculoskeletal: Normal range of motion.  Cardiovascular:     Rate and Rhythm: Normal rate.  Pulmonary:     Effort: Pulmonary effort is normal. No respiratory distress.  Abdominal:     General: There is no distension.     Palpations: Abdomen is soft.  Musculoskeletal: Normal range of motion.  Skin:    General: Skin is warm and dry.  Neurological:     Mental Status: She is alert.      UC Treatments / Results  Labs (all labs ordered are listed, but only abnormal results are displayed) Labs Reviewed - No data to display  EKG   Radiology No results found.  Procedures Procedures (including critical care time)  Medications Ordered in UC Medications - No data to display  Initial Impression / Assessment and Plan / UC Course  I have reviewed the triage vital signs and the nursing notes.  Pertinent labs & imaging results that were available during my care of the patient were reviewed by me and considered in my medical decision making (see chart for details).     Recommend against fall/is as I believe she is allergic to the adhesive.  Perhaps could find a hypoallergenic adhesive. Final Clinical Impressions(s) / UC Diagnoses   Final diagnoses:  Eczematous dermatitis of upper eyelids of both eyes     Discharge Instructions     Use eyedrops 4 times a day and rub into the lids Expect improvement in a couple days Return as needed You should not need this more than 5 days   ED Prescriptions    Medication Sig Dispense Auth. Provider   neomycin-polymyxin b-dexamethasone (MAXITROL) 3.5-10000-0.1 SUSP Place 1 drop into both eyes every 4 (four) hours for 5 days. 1.5 mL  Eustace MooreNelson, Siegfried Vieth Sue, MD     PDMP not reviewed this encounter.   Eustace MooreNelson, Laraina Sulton Sue, MD 06/05/19 [phone]

## 2019-06-05 NOTE — Discharge Instructions (Signed)
Use eyedrops 4 times a day and rub into the lids Expect improvement in a couple days Return as needed You should not need this more than 5 days

## 2019-06-05 NOTE — ED Triage Notes (Signed)
Pt presents to UC w/ c/o both eyes swelling, itching, burning x1 week. She has had increased drainage from both eyes. Pt reports she noticed some "discoloration" on inner eyelid. Pt reports taking allergy medication to help with symptoms without relief.

## 2019-07-29 IMAGING — DX DG CHEST 2V
2 series · 2 of 2 positions shown · non-contrast
Comparison: Chest x-ray dated 03/08/2017.

CLINICAL DATA: Cough, nausea and vomiting.  Smoker of marijuana.

EXAM:
CHEST  2 VIEW

[chest pa]
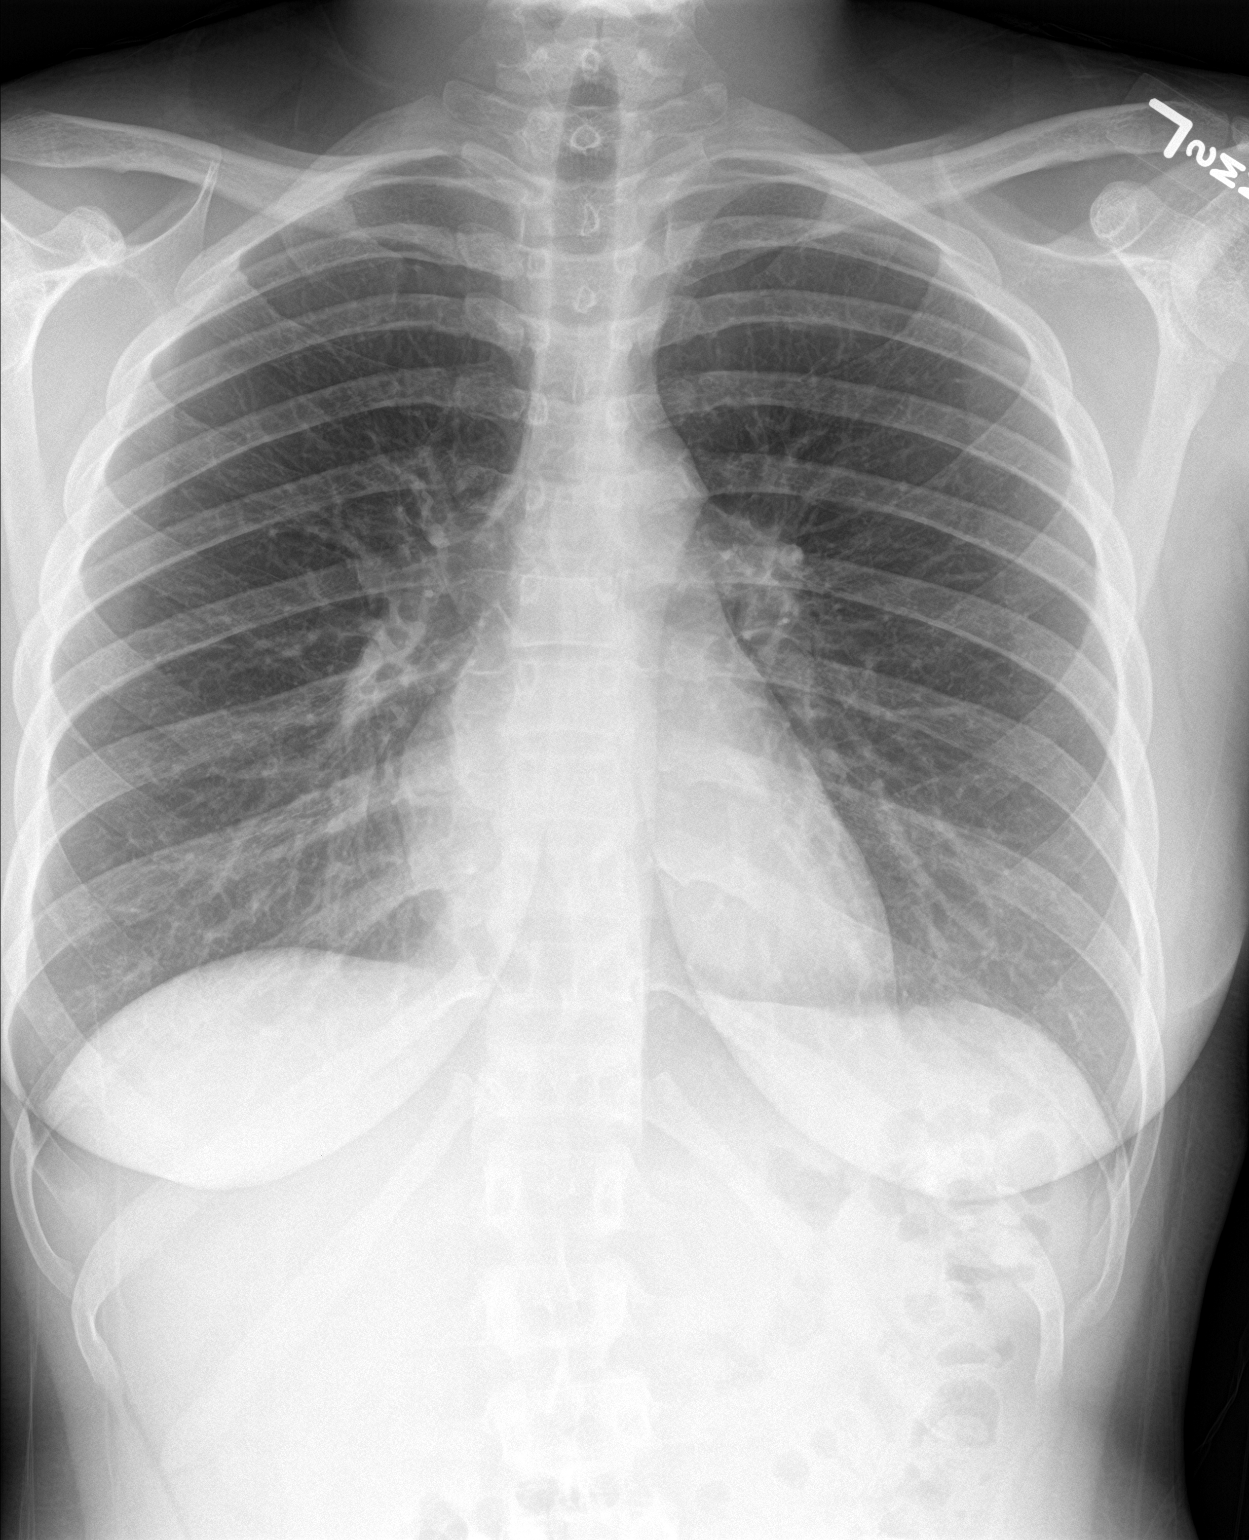

[chest lat]
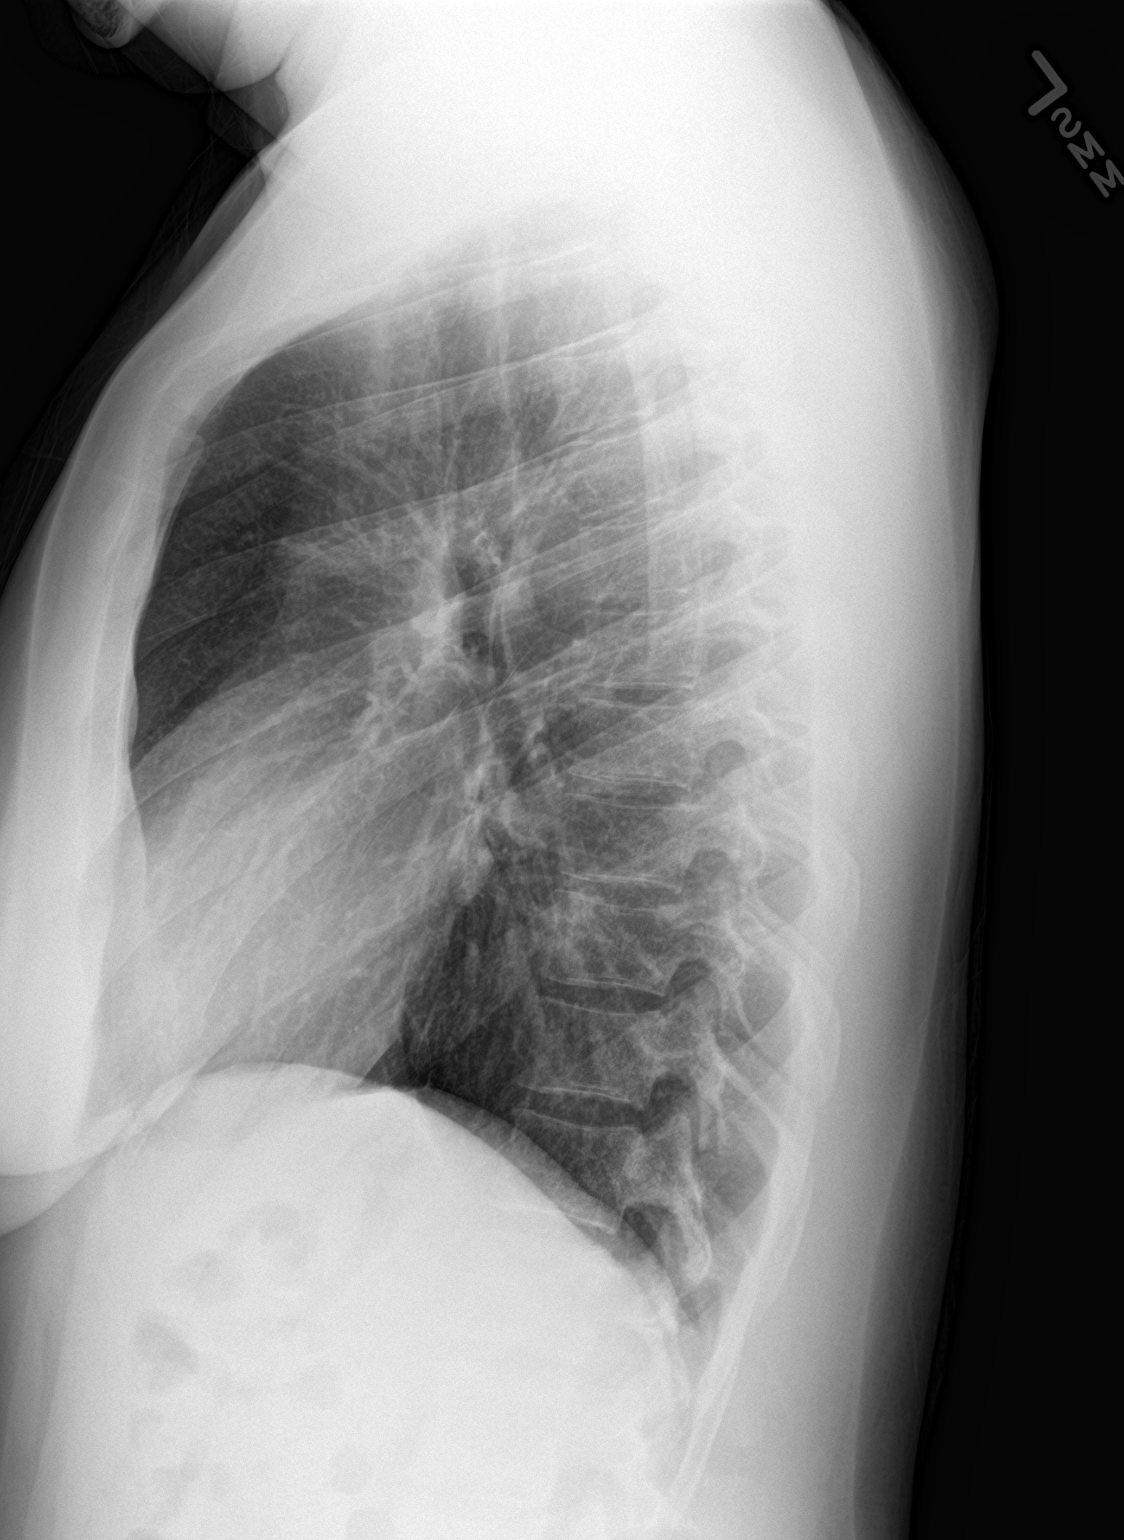

[2 of 2 positions shown; findings below may reference images not displayed]

FINDINGS: Heart size and mediastinal contours are within normal limits. Lungs
are clear. Lung volumes are normal. No pleural effusion or
pneumothorax seen. Osseous structures about the chest are
unremarkable.
IMPRESSION: No active cardiopulmonary disease.  No evidence of pneumonia.

## 2019-08-06 ENCOUNTER — Encounter (HOSPITAL_COMMUNITY): Payer: Self-pay | Admitting: Obstetrics and Gynecology

## 2019-08-06 ENCOUNTER — Other Ambulatory Visit: Payer: Self-pay

## 2019-08-06 ENCOUNTER — Inpatient Hospital Stay (HOSPITAL_COMMUNITY): Payer: Medicaid Other

## 2019-08-06 ENCOUNTER — Inpatient Hospital Stay (HOSPITAL_COMMUNITY)
Admission: AD | Admit: 2019-08-06 | Discharge: 2019-08-07 | Disposition: A | Discharge status: Home / Self Care | Payer: Medicaid Other | Attending: Obstetrics and Gynecology | Admitting: Obstetrics and Gynecology

## 2019-08-06 DIAGNOSIS — K219 Gastro-esophageal reflux disease without esophagitis: Secondary | ICD-10-CM | POA: Diagnosis not present

## 2019-08-06 DIAGNOSIS — R109 Unspecified abdominal pain: Secondary | ICD-10-CM | POA: Diagnosis not present

## 2019-08-06 DIAGNOSIS — Z87891 Personal history of nicotine dependence: Secondary | ICD-10-CM | POA: Diagnosis not present

## 2019-08-06 DIAGNOSIS — Z3A01 Less than 8 weeks gestation of pregnancy: Secondary | ICD-10-CM | POA: Insufficient documentation

## 2019-08-06 DIAGNOSIS — O99011 Anemia complicating pregnancy, first trimester: Secondary | ICD-10-CM | POA: Insufficient documentation

## 2019-08-06 DIAGNOSIS — O26891 Other specified pregnancy related conditions, first trimester: Secondary | ICD-10-CM | POA: Insufficient documentation

## 2019-08-06 DIAGNOSIS — O3680X0 Pregnancy with inconclusive fetal viability, not applicable or unspecified: Secondary | ICD-10-CM

## 2019-08-06 DIAGNOSIS — O209 Hemorrhage in early pregnancy, unspecified: Secondary | ICD-10-CM | POA: Diagnosis present

## 2019-08-06 DIAGNOSIS — O99611 Diseases of the digestive system complicating pregnancy, first trimester: Secondary | ICD-10-CM | POA: Insufficient documentation

## 2019-08-06 DIAGNOSIS — D649 Anemia, unspecified: Secondary | ICD-10-CM

## 2019-08-06 HISTORY — DX: Encounter for other specified aftercare: Z51.89

## 2019-08-06 HISTORY — DX: Gastro-esophageal reflux disease without esophagitis: K21.9

## 2019-08-06 LAB — CBC
HCT: 30.7 % — ABNORMAL LOW (ref 36.0–46.0)
Hemoglobin: 9.8 g/dL — ABNORMAL LOW (ref 12.0–15.0)
MCH: 25.1 pg — ABNORMAL LOW (ref 26.0–34.0)
MCHC: 31.9 g/dL (ref 30.0–36.0)
MCV: 78.7 fL — ABNORMAL LOW (ref 80.0–100.0)
Platelets: 293 10*3/uL (ref 150–400)
RBC: 3.9 MIL/uL (ref 3.87–5.11)
RDW: 15.6 % — ABNORMAL HIGH (ref 11.5–15.5)
WBC: 6 10*3/uL (ref 4.0–10.5)
nRBC: 0 % (ref 0.0–0.2)

## 2019-08-06 LAB — URINALYSIS, ROUTINE W REFLEX MICROSCOPIC
Bilirubin Urine: NEGATIVE
Glucose, UA: NEGATIVE mg/dL
Hgb urine dipstick: NEGATIVE
Ketones, ur: NEGATIVE mg/dL
Leukocytes,Ua: NEGATIVE
Nitrite: NEGATIVE
Protein, ur: 30 mg/dL — AB
Specific Gravity, Urine: 1.027 (ref 1.005–1.030)
pH: 7 (ref 5.0–8.0)

## 2019-08-06 LAB — WET PREP, GENITAL
Sperm: NONE SEEN
Trich, Wet Prep: NONE SEEN
Yeast Wet Prep HPF POC: NONE SEEN

## 2019-08-06 LAB — POCT PREGNANCY, URINE: Preg Test, Ur: POSITIVE — AB

## 2019-08-06 LAB — HIV ANTIBODY (ROUTINE TESTING W REFLEX): HIV Screen 4th Generation wRfx: NONREACTIVE

## 2019-08-06 MED ORDER — ONDANSETRON HCL 4 MG PO TABS
4.0000 mg | ORAL_TABLET | Freq: Once | ORAL | Status: AC
  Administered 2019-08-06: 4 mg via ORAL
  Filled 2019-08-06: qty 1

## 2019-08-06 MED ORDER — FAMOTIDINE 20 MG PO TABS
20.0000 mg | ORAL_TABLET | Freq: Once | ORAL | Status: AC
  Administered 2019-08-06: 20 mg via ORAL
  Filled 2019-08-06: qty 1

## 2019-08-06 NOTE — MAU Provider Note (Addendum)
Chief Complaint: Vaginal Bleeding and Abdominal Cramping   First Provider Initiated Contact with Patient 08/06/19 2151        SUBJECTIVE HPI: Melanie Hancock is a 23 y.o. [id number] at [redacted]w[redacted]d by LMP who presents to maternity admissions reporting pelvic cramping since Tuesday.  Also started bleeding last night, light in nature. This started after intercourse. . She denies vaginal itching/burning, urinary symptoms, h/a, dizziness, n/v, or fever/chills.    Vaginal Bleeding The patient's primary symptoms include pelvic pain and vaginal bleeding. The patient's pertinent negatives include no genital itching, genital lesions or genital odor. This is a new problem. The current episode started yesterday. The problem occurs constantly. The problem has been unchanged. The pain is mild. She is pregnant. Associated symptoms include abdominal pain. Pertinent negatives include no chills, constipation, diarrhea, fever, nausea or vomiting. The vaginal discharge was bloody and brown. The vaginal bleeding is lighter than menses. She has not been passing clots. She has not been passing tissue. Nothing aggravates the symptoms. She has tried nothing for the symptoms.  Abdominal Cramping This is a new problem. The current episode started in the past 7 days. The onset quality is gradual. The problem occurs intermittently. The problem has been unchanged. The pain is located in the suprapubic region and LLQ. The quality of the pain is cramping. The abdominal pain does not radiate. Pertinent negatives include no constipation, diarrhea, fever, nausea or vomiting. Nothing aggravates the pain. The pain is relieved by nothing. She has tried nothing for the symptoms.   RN note: JENNICE RENEGAR is a 23 y.o.  here in MAU reporting: abdominal cramping that began on tuesday 12/15. She also reports VB that began that started last night. She states her and her s/o attempted to have intercourse last night, but they stopped because she was  bleeding.  LMP:  07/06/19  Past Medical History:  Diagnosis Date  . Anemia   . Anxiety   . Blood transfusion without reported diagnosis   . Chronic back pain   . Depression   . GERD (gastroesophageal reflux disease)   . Infection    UTI  . Post partum depression   . Pyelonephritis   . UTI (lower urinary tract infection)    Past Surgical History:  Procedure Laterality Date  . DILATION AND CURETTAGE OF UTERUS    . INDUCED ABORTION    . WISDOM TOOTH EXTRACTION     Social History   Socioeconomic History  . Marital status: Single    Spouse name: Not on file  . Number of children: Not on file  . Years of education: Not on file  . Highest education level: Not on file  Occupational History  . Not on file  Tobacco Use  . Smoking status: Former Smoker    Quit date: 01/21/2014    Years since quitting: 5.5  . Smokeless tobacco: Never Used  Substance and Sexual Activity  . Alcohol use: Yes    Comment: rarely  . Drug use: No  . Sexual activity: Yes    Birth control/protection: None  Other Topics Concern  . Not on file  Social History Narrative  . Not on file   Social Determinants of Health   Financial Resource Strain:   . Difficulty of Paying Living Expenses: Not on file  Food Insecurity:   . Worried About Charity fundraiser in the Last Year: Not on file  . Ran Out of Food in the Last Year: Not on file  Transportation  Needs:   . Lack of Transportation (Medical): Not on file  . Lack of Transportation (Non-Medical): Not on file  Physical Activity:   . Days of Exercise per Week: Not on file  . Minutes of Exercise per Session: Not on file  Stress:   . Feeling of Stress : Not on file  Social Connections:   . Frequency of Communication with Friends and Family: Not on file  . Frequency of Social Gatherings with Friends and Family: Not on file  . Attends Religious Services: Not on file  . Active Member of Clubs or Organizations: Not on file  . Attends Tax inspector Meetings: Not on file  . Marital Status: Not on file  Intimate Partner Violence:   . Fear of Current or Ex-Partner: Not on file  . Emotionally Abused: Not on file  . Physically Abused: Not on file  . Sexually Abused: Not on file   No current facility-administered medications on file prior to encounter.   Current Outpatient Medications on File Prior to Encounter  Medication Sig Dispense Refill  . Prenatal Vit-Fe Fumarate-FA (PRENATAL MULTIVITAMIN) TABS tablet Take 1 tablet by mouth daily at 12 noon.    . valACYclovir (VALTREX) 1000 MG tablet Take 1,000 mg by mouth 2 (two) times daily.    Marland Kitchen zolpidem (AMBIEN) 5 MG tablet Take 5 mg by mouth at bedtime as needed for sleep.     No Known Allergies  I have reviewed patient's Past Medical Hx, Surgical Hx, Family Hx, Social Hx, medications and allergies.   ROS:  Review of Systems  Constitutional: Negative for chills and fever.  Gastrointestinal: Positive for abdominal pain. Negative for constipation, diarrhea, nausea and vomiting.  Genitourinary: Positive for pelvic pain and vaginal bleeding.   Review of Systems  Other systems negative   Physical Exam  Physical Exam Patient Vitals for the past 24 hrs:  BP Temp Temp src Pulse Resp SpO2 Height Weight  08/06/19 2131 121/65 - - 86 - - - -  08/06/19 2112 (!) 123/49 98.9 F (37.2 C) Oral 76 18 100 % 5\' 5"  (1.651 m) 71.8 kg   Constitutional: Well-developed, well-nourished female in no acute distress.  Cardiovascular: normal rate Respiratory: normal effort GI: Abd soft, non-tender. Pos BS x 4 MS: Extremities nontender, no edema, normal ROM Neurologic: Alert and oriented x 4.  GU: Neg CVAT.  PELVIC EXAM: Cervix pink, visually closed, without lesion, scant brownish red discharge, vaginal walls and external genitalia normal Bimanual exam: Cervix 0/long/high, firm, anterior, neg CMT, uterus and left adnexa mildly tender, nonenlarged, right adnexa without tenderness, enlargement,  or mass   LAB RESULTS Results for orders placed or performed during the hospital encounter of 08/06/19 (from the past 24 hour(s))  Urinalysis, Routine w reflex microscopic     Status: Abnormal   Collection Time: 08/06/19  9:08 PM  Result Value Ref Range   Color, Urine YELLOW YELLOW   APPearance HAZY (A) CLEAR   Specific Gravity, Urine 1.027 1.005 - 1.030   pH 7.0 5.0 - 8.0   Glucose, UA NEGATIVE NEGATIVE mg/dL   Hgb urine dipstick NEGATIVE NEGATIVE   Bilirubin Urine NEGATIVE NEGATIVE   Ketones, ur NEGATIVE NEGATIVE mg/dL   Protein, ur 30 (A) NEGATIVE mg/dL   Nitrite NEGATIVE NEGATIVE   Leukocytes,Ua NEGATIVE NEGATIVE   RBC / HPF 0-5 0 - 5 RBC/hpf   WBC, UA 0-5 0 - 5 WBC/hpf   Bacteria, UA RARE (A) NONE SEEN   Squamous Epithelial / LPF  0-5 0 - 5   Mucus PRESENT   Pregnancy, urine POC     Status: Abnormal   Collection Time: 08/06/19  9:14 PM  Result Value Ref Range   Preg Test, Ur POSITIVE (A) NEGATIVE  Wet prep, genital     Status: Abnormal   Collection Time: 08/06/19 10:01 PM  Result Value Ref Range   Yeast Wet Prep HPF POC NONE SEEN NONE SEEN   Trich, Wet Prep NONE SEEN NONE SEEN   Clue Cells Wet Prep HPF POC PRESENT (A) NONE SEEN   WBC, Wet Prep HPF POC FEW (A) NONE SEEN   Sperm NONE SEEN   CBC     Status: Abnormal   Collection Time: 08/06/19 10:04 PM  Result Value Ref Range   WBC 6.0 4.0 - 10.5 K/uL   RBC 3.90 3.87 - 5.11 MIL/uL   Hemoglobin 9.8 (L) 12.0 - 15.0 g/dL   HCT 56.230.7 (L) 13.036.0 - 86.546.0 %   MCV 78.7 (L) 80.0 - 100.0 fL   MCH 25.1 (L) 26.0 - 34.0 pg   MCHC 31.9 30.0 - 36.0 g/dL   RDW 78.415.6 (H) 69.611.5 - 29.515.5 %   Platelets 293 150 - 400 K/uL   nRBC 0.0 0.0 - 0.2 %  HIV Antibody (routine testing w rflx)     Status: None   Collection Time: 08/06/19 10:04 PM  Result Value Ref Range   HIV Screen 4th Generation wRfx NON REACTIVE NON REACTIVE  hCG, quantitative, pregnancy     Status: Abnormal   Collection Time: 08/06/19 10:52 PM  Result Value Ref Range   hCG, Beta  Chain, Quant, S 1,597 (H) <5 mIU/mL    IMAGING US OB Comp Less 14 Wks  Result Date: 08/06/2019 CLINICAL DATA:  Abdominal pain and cramping. Positive pregnancy test. EXAM: OBSTETRIC <14 WK US AND TRANSVAGINAL OB US TECHNIQUE: Both transabdominal and transvaginal ultrasound examinations were performed for complete evaluation of the gestation as well as the maternal uterus, adnexal regions, and pelvic cul-de-sac. Transvaginal technique was performed to assess early pregnancy. COMPARISON:  None. FINDINGS: Intrauterine gestational sac: None Yolk sac:  Not Visualized. Embryo:  Not Visualized. Cardiac Activity: Not Visualized. Maternal uterus/adnexae: Fluid is seen in the endometrial canal. There is a small amount of free fluid about the right adnexa. The right ovary measures 3.6 x 2 x 3.3 cm. The left ovary measures 3 x 2.2 by 1.8 cm IMPRESSION: 1. No IUP identified. 2. There is a small amount of free fluid in the patient's pelvis and endometrial canal. Electronically Signed   By: Katherine Mantlehristopher  Green M.D.   On: 08/06/2019 23:00     MAU Management/MDM: Ordered usual first trimester r/o ectopic labs.   Pelvic exam and cultures done Will check baseline Ultrasound to rule out ectopic.  This bleeding/pain can represent a normal pregnancy with bleeding, spontaneous abortion or even an ectopic which can be life-threatening.  The process as listed above helps to determine which of these is present.  Reviewed results with patient Cannot exclude ectopic just yet Recommend repeat HCG on Saturday Then would repeat US in 7-10 days  ASSESSMENT 1. Vaginal bleeding in pregnancy, first trimester   2. Abdominal pain during pregnancy in first trimester   3. Vaginal bleeding in pregnancy, first trimester   4. Abdominal pain during pregnancy in first trimester   Pregnancy at 3434w4d Anemia  PLAN Discharge home Plan to repeat HCG level in 48 hours  Will repeat  Ultrasound in about 7-10 days if  HCG levels double  appropriately  Ectopic precautions Rx Ferrous Sulfate bid for anemia  Pt stable at time of discharge. Encouraged to return here or to other Urgent Care/ED if she develops worsening of symptoms, increase in pain, fever, or other concerning symptoms.    Wynelle Bourgeois CNM, MSN Certified Nurse-Midwife 08/06/2019  9:51 PM

## 2019-08-06 NOTE — MAU Note (Signed)
. .  Melanie Hancock is a 23 y.o.  here in MAU reporting: abdominal cramping that began on tuesday 12/15. She also reports VB that began that started last night. She states her and her s/o attempted to have intercourse last night, but they stopped because she was bleeding.  LMP:  07/06/19  Pain score: 7 Vitals:   08/06/19 2112  BP: (!) 123/49  Pulse: 76  Resp: 18  Temp: 98.9 F (37.2 C)  SpO2: 100%

## 2019-08-07 LAB — GC/CHLAMYDIA PROBE AMP (~~LOC~~) NOT AT ARMC
Chlamydia: NEGATIVE
Comment: NEGATIVE
Comment: NORMAL
Neisseria Gonorrhea: NEGATIVE

## 2019-08-07 LAB — HCG, QUANTITATIVE, PREGNANCY: hCG, Beta Chain, Quant, S: 1597 m[IU]/mL — ABNORMAL HIGH (ref <5)

## 2019-08-07 MED ORDER — FERROUS SULFATE 325 (65 FE) MG PO TABS: 325.0000 mg | ORAL_TABLET | Freq: Two times a day (BID) | ORAL | 1 refills | Status: DC

## 2019-08-07 NOTE — Discharge Instructions (Signed)
Anemia  Anemia is a condition in which you do not have enough red blood cells or hemoglobin. Hemoglobin is a substance in red blood cells that carries oxygen. When you do not have enough red blood cells or hemoglobin (are anemic), your body cannot get enough oxygen and your organs may not work properly. As a result, you may feel very tired or have other problems. What are the causes? Common causes of anemia include:  Excessive bleeding. Anemia can be caused by excessive bleeding inside or outside the body, including bleeding from the intestine or from periods in women.  Poor nutrition.  Long-lasting (chronic) kidney, thyroid, and liver disease.  Bone marrow disorders.  Cancer and treatments for cancer.  HIV (human immunodeficiency virus) and AIDS (acquired immunodeficiency syndrome).  Treatments for HIV and AIDS.  Spleen problems.  Blood disorders.  Infections, medicines, and autoimmune disorders that destroy red blood cells. What are the signs or symptoms? Symptoms of this condition include:  Minor weakness.  Dizziness.  Headache.  Feeling heartbeats that are irregular or faster than normal (palpitations).  Shortness of breath, especially with exercise.  Paleness.  Cold sensitivity.  Indigestion.  Nausea.  Difficulty sleeping.  Difficulty concentrating. Symptoms may occur suddenly or develop slowly. If your anemia is mild, you may not have symptoms. How is this diagnosed? This condition is diagnosed based on:  Blood tests.  Your medical history.  A physical exam.  Bone marrow biopsy. Your health care provider may also check your stool (feces) for blood and may do additional testing to look for the cause of your bleeding. You may also have other tests, including:  Imaging tests, such as a CT scan or MRI.  Endoscopy.  Colonoscopy. How is this treated? Treatment for this condition depends on the cause. If you continue to lose a lot of blood, you may  need to be treated at a hospital. Treatment may include:  Taking supplements of iron, vitamin M08, or folic acid.  Taking a hormone medicine (erythropoietin) that can help to stimulate red blood cell growth.  Having a blood transfusion. This may be needed if you lose a lot of blood.  Making changes to your diet.  Having surgery to remove your spleen. Follow these instructions at home:  Take over-the-counter and prescription medicines only as told by your health care provider.  Take supplements only as told by your health care provider.  Follow any diet instructions that you were given.  Keep all follow-up visits as told by your health care provider. This is important. Contact a health care provider if:  You develop new bleeding anywhere in the body. Get help right away if:  You are very weak.  You are short of breath.  You have pain in your abdomen or chest.  You are dizzy or feel faint.  You have trouble concentrating.  You have bloody or black, tarry stools.  You vomit repeatedly or you vomit up blood. Summary  Anemia is a condition in which you do not have enough red blood cells or enough of a substance in your red blood cells that carries oxygen (hemoglobin).  Symptoms may occur suddenly or develop slowly.  If your anemia is mild, you may not have symptoms.  This condition is diagnosed with blood tests as well as a medical history and physical exam. Other tests may be needed.  Treatment for this condition depends on the cause of the anemia. This information is not intended to replace advice given to you by  your health care provider. Make sure you discuss any questions you have with your health care provider. Document Released: 09/13/2004 Document Revised: 07/19/2017 Document Reviewed: 09/07/2016 Elsevier Patient Education  2020 [employer]. Activity Restriction During Pregnancy Your health care provider may recommend specific activity restrictions during  pregnancy for a variety of reasons. Activity restriction may require that you limit activities that require great effort, such as exercise, lifting, or sex. The type of activity restriction will vary for each person, depending on your risk or the problems you are having. Activity restriction may be recommended for a period of time until your baby is delivered. Why are activity restrictions recommended? Activity restriction may be recommended if:  Your placenta is partially or completely covering the opening of your cervix (placenta previa).  There is bleeding between the wall of the uterus and the amniotic sac in the first trimester of pregnancy (subchorionic hemorrhage).  You went into labor too early (preterm labor).  You have a history of miscarriage.  You have a condition that causes high blood pressure during pregnancy (preeclampsia or eclampsia).  You are pregnant with more than one baby.  Your baby is not growing well. What are the risks? The risks depend on your specific restriction. Strict bed rest has the most physical and emotional risks and is no longer routinely recommended. Risks of strict bed rest include:  Loss of muscle conditioning from not moving.  Blood clots.  Social isolation.  Depression.  Loss of income. Talk with your health care team about activity restriction to decide if it is best for you and your baby. Even if you are having problems during your pregnancy, you may be able to continue with normal levels of activity with careful monitoring by your health care team. Follow these instructions at home: If needed, based on your overall health and the health of your baby, your health care provider will decide which type of activity restriction is right for you. Activity restrictions may include:  Not lifting anything heavier than 10 pounds (4.5 kg).  Avoiding activities that take a lot of physical effort.  No lifting or straining.  Resting in a sitting  position or lying down for periods of time during the day. Pelvic rest may be recommended along with activity restrictions. If pelvic rest is recommended, then:  Do not have sex, an orgasm, or use sexual stimulation.  Do not use tampons. Do not douche. Do not put anything into your vagina.  Do not lift anything that is heavier than 10 lb (4.5 kg).  Avoid activities that require a lot of effort.  Avoid any activity in which your pelvic muscles could become strained, such as squatting. Questions to ask your health care provider  Why is my activity being limited?  How will activity restrictions affect my body?  Why is rest helpful for me and my baby?  What activities can I do?  When can I return to normal activities? When should I seek immediate medical care? Seek immediate medical care if you have:  Vaginal bleeding.  Vaginal discharge.  Cramping pain in your lower abdomen.  Regular contractions.  A low, dull backache. Summary  Your health care provider may recommend specific activity restrictions during pregnancy for a variety of reasons.  Activity restriction may require that you limit activities such as exercise, lifting, sex, or any other activity that requires great effort.  Discuss the risks and benefits of activity restriction with your health care team to decide if it is  best for you and your baby.  Contact your health care provider right away if you think you are having contractions, or if you notice vaginal bleeding, discharge, or cramping. This information is not intended to replace advice given to you by your health care provider. Make sure you discuss any questions you have with your health care provider. Document Released: 12/01/2010 Document Revised: 11/26/2017 Document Reviewed: 11/26/2017 Elsevier Patient Education  [place].  Vaginal Bleeding During Pregnancy, First Trimester  A small amount of bleeding (spotting) from the vagina is common  during early pregnancy. Sometimes the bleeding is normal and does not cause problems. At other times, though, bleeding may be a sign of something serious. Tell your doctor about any bleeding from your vagina right away. Follow these instructions at home: Activity  Follow your doctor's instructions about how active you can be.  If needed, make plans for someone to help with your normal activities.  Do not have sex or orgasms until your doctor says that this is safe. General instructions  Take over-the-counter and prescription medicines only as told by your doctor.  Watch your condition for any changes.  Write down: ? The number of pads you use each day. ? How often you change pads. ? How soaked (saturated) your pads are.  Do not use tampons.  Do not douche.  If you pass any tissue from your vagina, save it to show to your doctor.  Keep all follow-up visits as told by your doctor. This is important. Contact a doctor if:  You have vaginal bleeding at any time while you are pregnant.  You have cramps.  You have a fever. Get help right away if:  You have very bad cramps in your back or belly (abdomen).  You pass large clots or a lot of tissue from your vagina.  Your bleeding gets worse.  You feel light-headed.  You feel weak.  You pass out (faint).  You have chills.  You are leaking fluid from your vagina.  You have a gush of fluid from your vagina. Summary  Sometimes vaginal bleeding during pregnancy is normal and does not cause problems. At other times, bleeding may be a sign of something serious.  Tell your doctor about any bleeding from your vagina right away.  Follow your doctor's instructions about how active you can be. You may need someone to help you with your normal activities. This information is not intended to replace advice given to you by your health care provider. Make sure you discuss any questions you have with your health care  provider. Document Released: 12/21/2013 Document Revised: 11/25/2018 Document Reviewed: 11/07/2016 Elsevier Patient Education  2020 [employer].

## 2019-08-08 ENCOUNTER — Other Ambulatory Visit: Payer: Self-pay

## 2019-08-08 ENCOUNTER — Inpatient Hospital Stay (HOSPITAL_COMMUNITY)
Admission: AD | Admit: 2019-08-08 | Discharge: 2019-08-08 | Disposition: A | Discharge status: Home / Self Care | Payer: Medicaid Other | Source: Ambulatory Visit | Attending: Obstetrics & Gynecology | Admitting: Obstetrics & Gynecology

## 2019-08-08 DIAGNOSIS — O3680X0 Pregnancy with inconclusive fetal viability, not applicable or unspecified: Secondary | ICD-10-CM

## 2019-08-08 DIAGNOSIS — Z3481 Encounter for supervision of other normal pregnancy, first trimester: Secondary | ICD-10-CM | POA: Diagnosis present

## 2019-08-08 LAB — HCG, QUANTITATIVE, PREGNANCY: hCG, Beta Chain, Quant, S: 3333 m[IU]/mL — ABNORMAL HIGH (ref <5)

## 2019-08-08 NOTE — Discharge Instructions (Signed)
Human Chorionic Gonadotropin Test °Why am I having this test? °A human chorionic gonadotropin (hCG) test is done to determine whether you are pregnant. It can also be used: °· To diagnose an abnormal pregnancy. °· To determine whether you have had a failed pregnancy (miscarriage) or are at risk of one. °What is being tested? °This test checks the level of the human chorionic gonadotropin (hCG) hormone in the blood. This hormone is produced during pregnancy by the cells that form the placenta. The placenta is the organ that grows inside your womb (uterus) to nourish a developing baby. When you are pregnant, hCG can be detected in your blood or urine 7 to 8 days before your missed period. It continues to go up for the first 8-10 weeks of pregnancy. °The presence of hCG in your blood can be measured with several different types of tests. You may have: °· A urine test. °? Because this hormone is eliminated from your body by your kidneys, you may have a urine test to find out whether you are pregnant. A home pregnancy test detects whether there is hCG in your urine. °? A urine test only shows whether there is hCG in your urine. It does not measure how much. °· A qualitative blood test. °? You may have this type of blood test to find out if you are pregnant. °? This blood test only shows whether there is hCG in your blood. It does not measure how much. °· A quantitative blood test. °? This type of blood test measures the amount of hCG in your blood. °? You may have this test to: °§ Diagnose an abnormal pregnancy. °§ Check whether you have had a miscarriage. °§ Determine whether you are at risk of a miscarriage. °What kind of sample is taken? ° °  ° °Two kinds of samples may be collected to test for the hCG hormone. °· Blood. It is usually collected by inserting a needle into a blood vessel. °· Urine. It is usually collected by urinating into a germ-free (sterile) specimen cup. It is best to collect the sample the first  time you urinate in the morning. °How do I prepare for this test? °No preparation is needed for a blood test.  °For the urine test: °· Let your health care provider know about: °? All medicines you are taking, including vitamins, herbs, creams, and over-the-counter medicines. °? Any blood in your urine. This may interfere with the result. °· Do not drink too much fluid. Drink as you normally would, or as directed by your health care provider. °How are the results reported? °Depending on the type of test that you have, your test results may be reported as values. Your health care provider will compare your results to normal ranges that were established after testing a large group of people (reference ranges). Reference ranges may vary among labs and hospitals. For this test, common reference ranges that show absence of pregnancy are: °· Quantitative hCG blood levels: less than 5 IU/L. °Other results will be reported as either positive or negative. For this test, normal results (meaning the absence of pregnancy) are: °· Negative for hCG in the urine test. °· Negative for hCG in the qualitative blood test. °What do the results mean? °Urine and qualitative blood test °· A negative result could mean: °? That you are not pregnant. °? That the test was done too early in your pregnancy to detect hCG in your blood or urine. If you still have other signs   of pregnancy, the test will be repeated. °· A positive result means: °? That you are most likely pregnant. Your health care provider may confirm your pregnancy with an imaging study (ultrasound) of your uterus, if needed. °Quantitative blood test °Results of the quantitative hCG blood test will be interpreted as follows: °· Less than 5 IU/L: You are most likely not pregnant. °· Greater than 25 IU/L: You are most likely pregnant. °· hCG levels that are higher than expected: °? You are pregnant with twins. °? You have abnormal growths in the uterus. °· hCG levels that are  rising more slowly than expected: °? You have an ectopic pregnancy (also called a tubal pregnancy). °· hCG levels that are falling: °? You may be having a miscarriage. °Talk with your health care provider about what your results mean. °Questions to ask your health care provider °Ask your health care provider, or the department that is doing the test: °· When will my results be ready? °· How will I get my results? °· What are my treatment options? °· What other tests do I need? °· What are my next steps? °Summary °· A human chorionic gonadotropin test is done to determine whether you are pregnant. °· When you are pregnant, hCG can be detected in your blood or urine 7 to 8 days before your missed period. It continues to go up for the first 8-10 weeks of pregnancy. °· Your hCG level can be measured with different types of tests. You may have a urine test, a qualitative blood test, or a quantitative blood test. °· Talk with your health care provider about what your results mean. °This information is not intended to replace advice given to you by your health care provider. Make sure you discuss any questions you have with your health care provider. °Document Released: 09/07/2004 Document Revised: 07/08/2017 Document Reviewed: 07/08/2017 °Elsevier Patient Education © 2020 [employer] ° °

## 2019-08-08 NOTE — [facility] Provider Note (Signed)
[name] is a 23 y.o. [phone] female with pregnancy of unknown location who presents to [facility] today for repeat Quant hCG. Patient states her abdominal pain is now intermittent cramping and her vaginal spotting and abnormal discharge are almost completely resolved.   O BP (!) 110/48 (BP Location: Right Arm)   Pulse 86   Temp 99.3 F (37.4 C) (Oral)   Resp 16   LMP 07/06/2019  Physical Exam  Nursing note and vitals reviewed. Constitutional: She is oriented to person, place, and time.  Respiratory: Effort normal.  GI: Soft.  Neurological: She is alert and oriented to person, place, and time.  Skin: Skin is warm and dry.   A Medical screening exam complete Appropriate rise in [name] hCG 1597 on 12/17, now 3333  P Patient requested discharge from [facility]U prior to results due to transportation issues She was called at home at 10:23pm, results and plan of care were reviewed Discharge from [facility]U in stable condition Follow-up ultrasound in 10-14 days Patient may return to [facility]U as needed for pregnancy related complaints  Mallie Snooks, CNM 08/08/2019 10:47 PM

## 2019-08-08 NOTE — [facility]U Note (Signed)
Patient left without being discharged by RN. Rosine Abe, CNM spoke with patient and went over discharge instructions with patient.

## 2019-08-08 NOTE — [facility] Note (Signed)
Pt here for repeat blood work. She reports she is still having some cramping, but it is about the same as it was when she was evaluated a few days ago. No more bleeding.

## 2019-08-12 ENCOUNTER — Ambulatory Visit (HOSPITAL_COMMUNITY): Payer: Medicaid Other

## 2019-08-16 ENCOUNTER — Ambulatory Visit (HOSPITAL_COMMUNITY)
Admission: EM | Admit: 2019-08-16 | Discharge: 2019-08-16 | Disposition: A | Discharge status: Home / Self Care | Payer: Medicaid Other | Attending: Emergency Medicine | Admitting: Emergency Medicine

## 2019-08-16 ENCOUNTER — Encounter (HOSPITAL_COMMUNITY): Payer: Self-pay | Admitting: Emergency Medicine

## 2019-08-16 ENCOUNTER — Other Ambulatory Visit: Payer: Self-pay

## 2019-08-16 DIAGNOSIS — R6884 Jaw pain: Secondary | ICD-10-CM | POA: Diagnosis not present

## 2019-08-16 MED ORDER — ACETAMINOPHEN 500 MG PO TABS: 500.0000 mg | ORAL_TABLET | Freq: Four times a day (QID) | ORAL | 0 refills | Status: DC | PRN

## 2019-08-16 MED ORDER — DOXYLAMINE-PYRIDOXINE 10-10 MG PO TBEC: 2.0000 | DELAYED_RELEASE_TABLET | Freq: Every day | ORAL | 0 refills | Status: DC
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
## 2019-08-16 NOTE — Discharge Instructions (Signed)
Pain Tylenol [phone] mg every 4-6 hours, Ice  Nausea Diclegis: Two tablets at bedtime on day 1 and 2; if symptoms persist, take 1 tablet in morning and 2 tablets at bedtime on day 3; if symptoms persist, may increase to 1 tablet in morning, 1 tablet mid-afternoon, and 2 tablets at bedtime on day 4 (maximum: doxylamine 40 mg/pyridoxine 40 mg (4 tablets) per day). OR One-half of the 25 mg Unisom sleep tablet over-the-counter tablet or two chewable 5 mg tablets can be used off-label as an antiemetic. In addition, pyridoxine 25 mg, also available over-the-counter, is taken three or four times per day;This is a reasonable, less expensive substitute for combination tablets.

## 2019-08-18 ENCOUNTER — Ambulatory Visit: Payer: Medicaid Other

## 2019-08-18 ENCOUNTER — Inpatient Hospital Stay (HOSPITAL_COMMUNITY)
Admission: AD | Admit: 2019-08-18 | Discharge: 2019-08-18 | Disposition: A | Discharge status: Home / Self Care | Payer: Medicaid Other | Attending: Obstetrics and Gynecology | Admitting: Obstetrics and Gynecology

## 2019-08-18 ENCOUNTER — Encounter (HOSPITAL_COMMUNITY): Payer: Self-pay | Admitting: Obstetrics and Gynecology

## 2019-08-18 ENCOUNTER — Other Ambulatory Visit: Payer: Self-pay

## 2019-08-18 DIAGNOSIS — O26891 Other specified pregnancy related conditions, first trimester: Secondary | ICD-10-CM | POA: Diagnosis not present

## 2019-08-18 DIAGNOSIS — M549 Dorsalgia, unspecified: Secondary | ICD-10-CM | POA: Diagnosis not present

## 2019-08-18 DIAGNOSIS — R112 Nausea with vomiting, unspecified: Secondary | ICD-10-CM | POA: Diagnosis present

## 2019-08-18 DIAGNOSIS — O99611 Diseases of the digestive system complicating pregnancy, first trimester: Secondary | ICD-10-CM | POA: Diagnosis not present

## 2019-08-18 DIAGNOSIS — O219 Vomiting of pregnancy, unspecified: Secondary | ICD-10-CM | POA: Diagnosis not present

## 2019-08-18 DIAGNOSIS — Z87891 Personal history of nicotine dependence: Secondary | ICD-10-CM | POA: Diagnosis not present

## 2019-08-18 DIAGNOSIS — G8929 Other chronic pain: Secondary | ICD-10-CM | POA: Insufficient documentation

## 2019-08-18 DIAGNOSIS — O99341 Other mental disorders complicating pregnancy, first trimester: Secondary | ICD-10-CM | POA: Insufficient documentation

## 2019-08-18 DIAGNOSIS — Z3A01 Less than 8 weeks gestation of pregnancy: Secondary | ICD-10-CM | POA: Diagnosis not present

## 2019-08-18 DIAGNOSIS — K219 Gastro-esophageal reflux disease without esophagitis: Secondary | ICD-10-CM | POA: Insufficient documentation

## 2019-08-18 DIAGNOSIS — F419 Anxiety disorder, unspecified: Secondary | ICD-10-CM | POA: Diagnosis not present

## 2019-08-18 LAB — URINALYSIS, ROUTINE W REFLEX MICROSCOPIC
Bilirubin Urine: NEGATIVE
Glucose, UA: NEGATIVE mg/dL
Hgb urine dipstick: NEGATIVE
Ketones, ur: NEGATIVE mg/dL
Leukocytes,Ua: NEGATIVE
Nitrite: NEGATIVE
Protein, ur: NEGATIVE mg/dL
Specific Gravity, Urine: 1.016 (ref 1.005–1.030)
pH: 8 (ref 5.0–8.0)

## 2019-08-18 MED ORDER — PYRIDOXINE HCL 25 MG PO TABS: 25.0000 mg | ORAL_TABLET | Freq: Three times a day (TID) | ORAL | 0 refills | Status: DC

## 2019-08-18 MED ORDER — DOXYLAMINE SUCCINATE (SLEEP) 25 MG PO TABS: 25.0000 mg | ORAL_TABLET | Freq: Three times a day (TID) | ORAL | 0 refills | Status: DC | PRN

## 2019-08-18 NOTE — Discharge Instructions (Signed)
Morning Sickness  Morning sickness is when you feel sick to your stomach (nauseous) during pregnancy. You may feel sick to your stomach and throw up (vomit). You may feel sick in the morning, but you can feel this way at any time of day. Some women feel very sick to their stomach and cannot stop throwing up (hyperemesis gravidarum). Follow these instructions at home: Medicines  Take over-the-counter and prescription medicines only as told by your doctor. Do not take any medicines until you talk with your doctor about them first.  Taking multivitamins before getting pregnant can stop or lessen the harshness of morning sickness. Eating and drinking  Eat dry toast or crackers before getting out of bed.  Eat 5 or 6 small meals a day.  Eat dry and bland foods like rice and baked potatoes.  Do not eat greasy, fatty, or spicy foods.  Have someone cook for you if the smell of food causes you to feel sick or throw up.  If you feel sick to your stomach after taking prenatal vitamins, take them at night or with a snack.  Eat protein when you need a snack. Nuts, yogurt, and cheese are good choices.  Drink fluids throughout the day.  Try ginger ale made with real ginger, ginger tea made from fresh grated ginger, or ginger candies. General instructions  Do not use any products that have nicotine or tobacco in them, such as cigarettes and e-cigarettes. If you need help quitting, ask your doctor.  Use an air purifier to keep the air in your house free of smells.  Get lots of fresh air.  Try to avoid smells that make you feel sick.  Try: ? Wearing a bracelet that is used for seasickness (acupressure wristband). ? Going to a doctor who puts thin needles into certain body points (acupuncture) to improve how you feel. Contact a doctor if:  You need medicine to feel better.  You feel dizzy or light-headed.  You are losing weight. Get help right away if:  You feel very sick to your  stomach and cannot stop throwing up.  You pass out (faint).  You have very bad pain in your belly. Summary  Morning sickness is when you feel sick to your stomach (nauseous) during pregnancy.  You may feel sick in the morning, but you can feel this way at any time of day.  Making some changes to what you eat may help your symptoms go away. This information is not intended to replace advice given to you by your health care provider. Make sure you discuss any questions you have with your health care provider. Document Released: 09/13/2004 Document Revised: 07/19/2017 Document Reviewed: 09/06/2016 Elsevier Patient Education  2020 [employer]  

## 2019-08-18 NOTE — MAU Note (Signed)
Urine sent without culture tube
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
## 2019-08-20 ENCOUNTER — Inpatient Hospital Stay (HOSPITAL_COMMUNITY)
Admission: AD | Admit: 2019-08-20 | Discharge: 2019-08-21 | Disposition: A | Discharge status: Home / Self Care | Payer: Medicaid Other | Attending: Obstetrics & Gynecology | Admitting: Obstetrics & Gynecology

## 2019-08-20 ENCOUNTER — Other Ambulatory Visit: Payer: Self-pay

## 2019-08-20 ENCOUNTER — Encounter (HOSPITAL_COMMUNITY): Payer: Self-pay | Admitting: Obstetrics & Gynecology

## 2019-08-20 DIAGNOSIS — Z87891 Personal history of nicotine dependence: Secondary | ICD-10-CM | POA: Insufficient documentation

## 2019-08-20 DIAGNOSIS — O219 Vomiting of pregnancy, unspecified: Secondary | ICD-10-CM

## 2019-08-20 DIAGNOSIS — Z825 Family history of asthma and other chronic lower respiratory diseases: Secondary | ICD-10-CM | POA: Insufficient documentation

## 2019-08-20 DIAGNOSIS — Z3A01 Less than 8 weeks gestation of pregnancy: Secondary | ICD-10-CM | POA: Diagnosis not present

## 2019-08-20 DIAGNOSIS — R112 Nausea with vomiting, unspecified: Secondary | ICD-10-CM | POA: Diagnosis present

## 2019-08-20 DIAGNOSIS — R12 Heartburn: Secondary | ICD-10-CM | POA: Diagnosis not present

## 2019-08-20 DIAGNOSIS — O26891 Other specified pregnancy related conditions, first trimester: Secondary | ICD-10-CM | POA: Diagnosis not present

## 2019-08-20 LAB — CBC
HCT: 34.9 % — ABNORMAL LOW (ref 36.0–46.0)
Hemoglobin: 10.9 g/dL — ABNORMAL LOW (ref 12.0–15.0)
MCH: 24.8 pg — ABNORMAL LOW (ref 26.0–34.0)
MCHC: 31.2 g/dL (ref 30.0–36.0)
MCV: 79.3 fL — ABNORMAL LOW (ref 80.0–100.0)
Platelets: 331 10*3/uL (ref 150–400)
RBC: 4.4 MIL/uL (ref 3.87–5.11)
RDW: 16.7 % — ABNORMAL HIGH (ref 11.5–15.5)
WBC: 7.4 10*3/uL (ref 4.0–10.5)
nRBC: 0 % (ref 0.0–0.2)

## 2019-08-20 LAB — COMPREHENSIVE METABOLIC PANEL
ALT: 20 U/L (ref 0–44)
AST: 18 U/L (ref 15–41)
Albumin: 4.3 g/dL (ref 3.5–5.0)
Alkaline Phosphatase: 42 U/L (ref 38–126)
Anion gap: 11 (ref 5–15)
BUN: 6 mg/dL (ref 6–20)
CO2: 21 mmol/L — ABNORMAL LOW (ref 22–32)
Calcium: 9.5 mg/dL (ref 8.9–10.3)
Chloride: 102 mmol/L (ref 98–111)
Creatinine, Ser: 0.72 mg/dL (ref 0.44–1.00)
GFR calc Af Amer: >60 mL/min (ref >60)
GFR calc non Af Amer: >60 mL/min (ref >60)
Glucose, Bld: 89 mg/dL (ref 70–99)
Potassium: 3.6 mmol/L (ref 3.5–5.1)
Sodium: 134 mmol/L — ABNORMAL LOW (ref 135–145)
Total Bilirubin: 0.7 mg/dL (ref 0.3–1.2)
Total Protein: 7.4 g/dL (ref 6.5–8.1)

## 2019-08-20 LAB — URINALYSIS, ROUTINE W REFLEX MICROSCOPIC
Bilirubin Urine: NEGATIVE
Glucose, UA: NEGATIVE mg/dL
Hgb urine dipstick: NEGATIVE
Ketones, ur: NEGATIVE mg/dL
Leukocytes,Ua: NEGATIVE
Nitrite: NEGATIVE
Protein, ur: NEGATIVE mg/dL
Specific Gravity, Urine: 1.008 (ref 1.005–1.030)
pH: 9 — ABNORMAL HIGH (ref 5.0–8.0)

## 2019-08-20 MED ORDER — LACTATED RINGERS IV BOLUS
1000.0000 mL | Freq: Once | INTRAVENOUS | Status: AC
  Administered 2019-08-20: 1000 mL via INTRAVENOUS

## 2019-08-20 MED ORDER — DIPHENHYDRAMINE HCL 50 MG/ML IJ SOLN
INTRAMUSCULAR | Status: AC
  Filled 2019-08-20: qty 1

## 2019-08-20 MED ORDER — DIPHENHYDRAMINE HCL 50 MG/ML IJ SOLN
25.0000 mg | Freq: Once | INTRAMUSCULAR | Status: AC
  Administered 2019-08-20: 25 mg via INTRAVENOUS
  Filled 2019-08-20: qty 1

## 2019-08-20 MED ORDER — FAMOTIDINE IN NACL 20-0.9 MG/50ML-% IV SOLN
20.0000 mg | Freq: Once | INTRAVENOUS | Status: AC
  Administered 2019-08-20: 20 mg via INTRAVENOUS
  Filled 2019-08-20: qty 50

## 2019-08-20 MED ORDER — PROMETHAZINE HCL 25 MG/ML IJ SOLN
25.0000 mg | Freq: Once | INTRAMUSCULAR | Status: AC
  Administered 2019-08-20: 25 mg via INTRAVENOUS
  Filled 2019-08-20: qty 1

## 2019-08-20 MED ORDER — METOCLOPRAMIDE HCL 5 MG/ML IJ SOLN
10.0000 mg | Freq: Once | INTRAMUSCULAR | Status: AC
  Administered 2019-08-20: 10 mg via INTRAVENOUS
  Filled 2019-08-20: qty 2

## 2019-08-20 MED ORDER — PROCHLORPERAZINE EDISYLATE 10 MG/2ML IJ SOLN
10.0000 mg | Freq: Four times a day (QID) | INTRAMUSCULAR | Status: DC | PRN
  Administered 2019-08-20: 10 mg via INTRAVENOUS
  Filled 2019-08-20 (×2): qty 2

## 2019-08-20 NOTE — MAU Note (Signed)
Was seen here 2 days ago for n/v. Given Diclegis but states helps alittle. Unable to keep down anything for past 2 wks. Denies vag bleeding. Headaches and lightheaded. "Seeing stars" and very weak.

## 2019-08-20 NOTE — Progress Notes (Signed)
FOB at bedside.

## 2019-08-21 ENCOUNTER — Encounter (HOSPITAL_COMMUNITY): Payer: Self-pay | Admitting: Obstetrics and Gynecology

## 2019-08-21 ENCOUNTER — Other Ambulatory Visit: Payer: Self-pay

## 2019-08-21 ENCOUNTER — Inpatient Hospital Stay (EMERGENCY_DEPARTMENT_HOSPITAL)
Admission: AD | Admit: 2019-08-21 | Discharge: 2019-08-21 | Disposition: A | Discharge status: Home / Self Care | Payer: Medicaid Other | Source: Home / Self Care | Attending: Obstetrics and Gynecology | Admitting: Obstetrics and Gynecology

## 2019-08-21 DIAGNOSIS — Z3A01 Less than 8 weeks gestation of pregnancy: Secondary | ICD-10-CM

## 2019-08-21 DIAGNOSIS — R12 Heartburn: Secondary | ICD-10-CM

## 2019-08-21 DIAGNOSIS — O219 Vomiting of pregnancy, unspecified: Secondary | ICD-10-CM

## 2019-08-21 DIAGNOSIS — R112 Nausea with vomiting, unspecified: Secondary | ICD-10-CM

## 2019-08-21 DIAGNOSIS — F1290 Cannabis use, unspecified, uncomplicated: Secondary | ICD-10-CM

## 2019-08-21 DIAGNOSIS — T50905A Adverse effect of unspecified drugs, medicaments and biological substances, initial encounter: Secondary | ICD-10-CM

## 2019-08-21 DIAGNOSIS — O26891 Other specified pregnancy related conditions, first trimester: Secondary | ICD-10-CM

## 2019-08-21 LAB — URINALYSIS, ROUTINE W REFLEX MICROSCOPIC
Bilirubin Urine: NEGATIVE
Glucose, UA: NEGATIVE mg/dL
Hgb urine dipstick: NEGATIVE
Ketones, ur: 80 mg/dL — AB
Leukocytes,Ua: NEGATIVE
Nitrite: NEGATIVE
Protein, ur: 30 mg/dL — AB
Specific Gravity, Urine: 1.025 (ref 1.005–1.030)
pH: 8 (ref 5.0–8.0)

## 2019-08-21 LAB — COMPREHENSIVE METABOLIC PANEL
ALT: 17 U/L (ref 0–44)
AST: 16 U/L (ref 15–41)
Albumin: 4.1 g/dL (ref 3.5–5.0)
Alkaline Phosphatase: 46 U/L (ref 38–126)
Anion gap: 12 (ref 5–15)
BUN: 10 mg/dL (ref 6–20)
CO2: 20 mmol/L — ABNORMAL LOW (ref 22–32)
Calcium: 9.5 mg/dL (ref 8.9–10.3)
Chloride: 104 mmol/L (ref 98–111)
Creatinine, Ser: 0.7 mg/dL (ref 0.44–1.00)
GFR calc Af Amer: >60 mL/min (ref >60)
GFR calc non Af Amer: >60 mL/min (ref >60)
Glucose, Bld: 117 mg/dL — ABNORMAL HIGH (ref 70–99)
Potassium: 3.6 mmol/L (ref 3.5–5.1)
Sodium: 136 mmol/L (ref 135–145)
Total Bilirubin: 0.8 mg/dL (ref 0.3–1.2)
Total Protein: 7.3 g/dL (ref 6.5–8.1)

## 2019-08-21 LAB — RAPID URINE DRUG SCREEN, HOSP PERFORMED
Amphetamines: NOT DETECTED
Barbiturates: NOT DETECTED
Benzodiazepines: NOT DETECTED
Cocaine: NOT DETECTED
Opiates: NOT DETECTED
Tetrahydrocannabinol: POSITIVE — AB

## 2019-08-21 LAB — CBC
HCT: 32.7 % — ABNORMAL LOW (ref 36.0–46.0)
Hemoglobin: 10.6 g/dL — ABNORMAL LOW (ref 12.0–15.0)
MCH: 25.2 pg — ABNORMAL LOW (ref 26.0–34.0)
MCHC: 32.4 g/dL (ref 30.0–36.0)
MCV: 77.7 fL — ABNORMAL LOW (ref 80.0–100.0)
Platelets: 296 10*3/uL (ref 150–400)
RBC: 4.21 MIL/uL (ref 3.87–5.11)
RDW: 16.9 % — ABNORMAL HIGH (ref 11.5–15.5)
WBC: 8.1 10*3/uL (ref 4.0–10.5)
nRBC: 0 % (ref 0.0–0.2)

## 2019-08-21 MED ORDER — SCOPOLAMINE 1 MG/3DAYS TD PT72
1.0000 | MEDICATED_PATCH | Freq: Once | TRANSDERMAL | Status: DC
  Administered 2019-08-21: 1.5 mg via TRANSDERMAL
  Filled 2019-08-21: qty 1

## 2019-08-21 MED ORDER — PROCHLORPERAZINE MALEATE 10 MG PO TABS: 10.0000 mg | ORAL_TABLET | Freq: Four times a day (QID) | ORAL | 3 refills | Status: DC | PRN

## 2019-08-21 MED ORDER — SCOPOLAMINE 1 MG/3DAYS TD PT72: 1.0000 | MEDICATED_PATCH | TRANSDERMAL | 0 refills | Status: AC

## 2019-08-21 MED ORDER — PROMETHAZINE HCL 25 MG PO TABS: 25.0000 mg | ORAL_TABLET | Freq: Four times a day (QID) | ORAL | 0 refills | Status: DC | PRN

## 2019-08-21 MED ORDER — ACETAMINOPHEN 500 MG PO TABS: 1000.0000 mg | ORAL_TABLET | Freq: Once | ORAL | Status: DC

## 2019-08-21 MED ORDER — LACTATED RINGERS IV BOLUS
1000.0000 mL | Freq: Once | INTRAVENOUS | Status: AC
  Administered 2019-08-21: 17:00:00 1000 mL via INTRAVENOUS

## 2019-08-21 MED ORDER — LACTATED RINGERS IV BOLUS
1000.0000 mL | Freq: Once | INTRAVENOUS | Status: AC
  Administered 2019-08-21: 1000 mL via INTRAVENOUS

## 2019-08-21 MED ORDER — FAMOTIDINE 20 MG PO TABS: 20.0000 mg | ORAL_TABLET | Freq: Two times a day (BID) | ORAL | 0 refills | Status: DC

## 2019-08-21 MED ORDER — PROMETHAZINE HCL 25 MG/ML IJ SOLN
12.5000 mg | Freq: Once | INTRAMUSCULAR | Status: AC
  Administered 2019-08-21: 20:00:00 12.5 mg via INTRAVENOUS
  Filled 2019-08-21: qty 1

## 2019-08-21 NOTE — MAU Provider Note (Signed)
History     CSN: [id number]  Arrival date and time: 08/20/19 0630   First Provider Initiated Contact with Patient 08/20/19 2354      Chief Complaint  Patient presents with  . Emesis During Pregnancy  . Headache   HPI Melanie Hancock is a 24 y.o. [id number] at [redacted]w[redacted]d who presents to MAU with chief complaint of nausea and vomiting. This is a recurrent problem. Patient has a prescription for B6 and Doxylamine but does not feel they are effective. She reports she has "tried everything" with regards to PO nutrition and hydration. She endorses general malaise and concern about the severity of her nausea and vomiting.  Patient also complains of recurrent feeling of straining to pass stool, then feeling hot and slightly lightheaded. She denies constipation. She denies prolonged straining with bowel movements. She denies hemorrhoids.  Patient denies dysuria, abdominal tenderness, vaginal bleeding, fever or recent illness.   OB History    Gravida  5   Para  3   Term  3   Preterm      AB  1   Living  3     SAB  0   TAB  1   Ectopic      Multiple  0   Live Births  3           Past Medical History:  Diagnosis Date  . Anemia   . Anxiety   . Blood transfusion without reported diagnosis   . Chronic back pain   . Depression   . GERD (gastroesophageal reflux disease)   . Infection    UTI  . Post partum depression   . Pyelonephritis   . UTI (lower urinary tract infection)     Past Surgical History:  Procedure Laterality Date  . DILATION AND CURETTAGE OF UTERUS    . INDUCED ABORTION    . WISDOM TOOTH EXTRACTION      Family History  Problem Relation Age of Onset  . Arthritis Mother   . Healthy Father   . Arthritis Maternal Grandmother   . Asthma Son   . Alcohol abuse Neg Hx     Social History   Tobacco Use  . Smoking status: Former Smoker    Quit date: 01/21/2014    Years since quitting: 5.5  . Smokeless tobacco: Never Used  Substance Use Topics  .  Alcohol use: Yes    Comment: rarely  . Drug use: No    Allergies: No Known Allergies  No medications prior to admission.    Review of Systems  Constitutional: Negative for chills, fatigue and fever.  Respiratory: Negative for shortness of breath.   Gastrointestinal: Positive for nausea and vomiting.  Genitourinary: Negative for difficulty urinating, flank pain and vaginal bleeding.  Neurological: Positive for dizziness, weakness and light-headedness.  All other systems reviewed and are negative.  Physical Exam   Blood pressure 123/63, pulse 66, temperature 98.4 F (36.9 C), temperature source Oral, resp. rate 18, height 5\' 5"  (1.651 m), weight 72.1 kg, last menstrual period 07/06/2019, SpO2 100 %.  Physical Exam  Nursing note and vitals reviewed. Constitutional: She is oriented to person, place, and time. She appears well-developed and well-nourished.  Cardiovascular: Normal rate.  Respiratory: Effort normal and breath sounds normal.  GI: Soft. She exhibits no distension. There is no abdominal tenderness. There is no rebound and no guarding.  Neurological: She is alert and oriented to person, place, and time.  Skin: Skin is warm and dry.  Psychiatric: She has a normal mood and affect. Her behavior is normal. Judgment and thought content normal.    MAU Course/MDM  Procedures  --Provided reassurance that UA does not show ketonuria or abnormal electrolytes. Advised that right balance of bland diet and new medication regimen should bring improvement. --Weight stable in comparison to previous MAU visits  Patient Vitals for the past 24 hrs:  BP Temp Temp src Pulse Resp SpO2 Height Weight  08/21/19 0022 123/63 98.4 F (36.9 C) Oral 66 18 100 % -- --  08/20/19 1929 (!) 118/38 98.3 F (36.8 C) -- 62 18 -- -- --  08/20/19 1928 -- -- -- -- -- -- 5\' 5"  (1.651 m) 72.1 kg   Results for orders placed or performed during the hospital encounter of 08/20/19 (from the past 24 hour(s))   Urinalysis, Routine w reflex microscopic     Status: Abnormal   Collection Time: 08/20/19  7:52 PM  Result Value Ref Range   Color, Urine STRAW (A) YELLOW   APPearance CLEAR CLEAR   Specific Gravity, Urine 1.008 1.005 - 1.030   pH 9.0 (H) 5.0 - 8.0   Glucose, UA NEGATIVE NEGATIVE mg/dL   Hgb urine dipstick NEGATIVE NEGATIVE   Bilirubin Urine NEGATIVE NEGATIVE   Ketones, ur NEGATIVE NEGATIVE mg/dL   Protein, ur NEGATIVE NEGATIVE mg/dL   Nitrite NEGATIVE NEGATIVE   Leukocytes,Ua NEGATIVE NEGATIVE  CBC     Status: Abnormal   Collection Time: 08/20/19  8:37 PM  Result Value Ref Range   WBC 7.4 4.0 - 10.5 K/uL   RBC 4.40 3.87 - 5.11 MIL/uL   Hemoglobin 10.9 (L) 12.0 - 15.0 g/dL   HCT 08/22/19 (L) 75.1 - 02.5 %   MCV 79.3 (L) 80.0 - 100.0 fL   MCH 24.8 (L) 26.0 - 34.0 pg   MCHC 31.2 30.0 - 36.0 g/dL   RDW 85.2 (H) 77.8 - 24.2 %   Platelets 331 150 - 400 K/uL   nRBC 0.0 0.0 - 0.2 %  Comprehensive metabolic panel     Status: Abnormal   Collection Time: 08/20/19  8:37 PM  Result Value Ref Range   Sodium 134 (L) 135 - 145 mmol/L   Potassium 3.6 3.5 - 5.1 mmol/L   Chloride 102 98 - 111 mmol/L   CO2 21 (L) 22 - 32 mmol/L   Glucose, Bld 89 70 - 99 mg/dL   BUN 6 6 - 20 mg/dL   Creatinine, Ser 08/22/19 0.44 - 1.00 mg/dL   Calcium 9.5 8.9 - 6.14 mg/dL   Total Protein 7.4 6.5 - 8.1 g/dL   Albumin 4.3 3.5 - 5.0 g/dL   AST 18 15 - 41 U/L   ALT 20 0 - 44 U/L   Alkaline Phosphatase 42 38 - 126 U/L   Total Bilirubin 0.7 0.3 - 1.2 mg/dL   GFR calc non Af Amer >60 >60 mL/min   GFR calc Af Amer >60 >60 mL/min   Anion gap 11 5 - 15   Meds ordered this encounter  Medications  . promethazine (PHENERGAN) injection 25 mg  . famotidine (PEPCID) IVPB 20 mg premix  . lactated ringers bolus 1,000 mL  . metoCLOPramide (REGLAN) injection 10 mg  . diphenhydrAMINE (BENADRYL) injection 25 mg  . prochlorperazine (COMPAZINE) injection 10 mg  . diphenhydrAMINE (BENADRYL) 50 MG/ML injection    43.1,  Jeanetta   : cabinet override  . promethazine (PHENERGAN) 25 MG tablet    Sig: Take 1 tablet (25 mg total)  by mouth every 6 (six) hours as needed for nausea or vomiting.    Dispense:  30 tablet    Refill:  0    Order Specific Question:   Supervising Provider    Answer:   Woodroe Mode [[id number]]  . famotidine (PEPCID) 20 MG tablet    Sig: Take 1 tablet (20 mg total) by mouth 2 (two) times daily.    Dispense:  30 tablet    Refill:  0    Order Specific Question:   Supervising Provider    Answer:   Woodroe Mode [[id number]]  . prochlorperazine (COMPAZINE) 10 MG tablet    Sig: Take 1 tablet (10 mg total) by mouth every 6 (six) hours as needed for nausea or vomiting.    Dispense:  30 tablet    Refill:  3    Order Specific Question:   Supervising Provider    Answer:   Woodroe Mode [[id number]]   Assessment and Plan  --24 y.o. [phone] at [redacted]w[redacted]d  --Nausea/vomiting in first trimester --New medication regimen, advised Phenergan q 6. Can use as suppository if unsuccessful with PO --Discharge home in stable condition  Darlina Rumpf, CNM 08/21/2019, 1:57 AM

## 2019-08-21 NOTE — MAU Provider Note (Addendum)
History     CSN: [id number]  Arrival date and time: 08/21/19 1511   First Provider Initiated Contact with Patient 08/21/19 1601      Chief Complaint  Patient presents with   Morning Sickness   [name] is a 24 y.o. [id number] at [redacted]w[redacted]d who presents to MAU for shortness of breath and burning in her stomach. Pt having difficulty responding to questions and speaking clearly at times. Pt states she is exhausted and does not understand why. Pt reports after she left the hospital this morning she went home and slept. Pt denies current drug use.  Onset: yesterday Location: chest, stomach Duration: ~24hrs Character: SOB = difficulty breathing constant, heart racing, burning = epigastrum Aggravating/Associated: moving, vomiting for SOB; none for burning/nausea and vomiting Relieving: lying still for SOB, none for burning Treatment: pt reports taking phenergan, compazine and famotidine around 10AM this morning  Pt denies VB, vaginal discharge/odor/itching. Pt denies abdominal pain, constipation, diarrhea, or urinary problems. Pt denies fever, chills, sweating or changes in appetite. Pt denies chest pain. Pt denies dizziness, HA, light-headedness, weakness.  Problems this pregnancy include: N/V. Allergies? NKDA Current medications/supplements? phenergan, compazine, Pepcid Prenatal care provider? [facility], next appt 09/17/2018  OB History     Gravida  5   Para  3   Term  3   Preterm      AB  1   Living  3      SAB  0   TAB  1   Ectopic      Multiple  0   Live Births  3           Past Medical History:  Diagnosis Date   Anemia    Anxiety    Blood transfusion without reported diagnosis    Chronic back pain    Depression    GERD (gastroesophageal reflux disease)    Infection    UTI   Post partum depression    Pyelonephritis    UTI (lower urinary tract infection)     Past Surgical History:  Procedure Laterality Date   DILATION AND CURETTAGE OF UTERUS      INDUCED ABORTION     WISDOM TOOTH EXTRACTION      Family History  Problem Relation Age of Onset   Arthritis Mother    Healthy Father    Arthritis Maternal Grandmother    Asthma Son    Alcohol abuse Neg Hx     Social History   Tobacco Use   Smoking status: Former Smoker    Quit date: 01/21/2014    Years since quitting: 5.5   Smokeless tobacco: Never Used  Substance Use Topics   Alcohol use: Yes    Comment: rarely   Drug use: No    Allergies: No Known Allergies  Medications Prior to Admission  Medication Sig Dispense Refill Last Dose   acetaminophen (TYLENOL) 500 MG tablet Take 1 tablet (500 mg total) by mouth every 6 (six) hours as needed for mild pain or moderate pain. 30 tablet 0    doxylamine, Sleep, (UNISOM) 25 MG tablet Take 1 tablet (25 mg total) by mouth every 8 (eight) hours as needed. 30 tablet 0    famotidine (PEPCID) 20 MG tablet Take 1 tablet (20 mg total) by mouth 2 (two) times daily. 30 tablet 0    ferrous sulfate (FERROUSUL) 325 (65 FE) MG tablet Take 1 tablet (325 mg total) by mouth 2 (two) times daily. 60 tablet 1  Prenatal Vit-Fe Fumarate-FA (PRENATAL MULTIVITAMIN) TABS tablet Take 1 tablet by mouth daily at 12 noon.      prochlorperazine (COMPAZINE) 10 MG tablet Take 1 tablet (10 mg total) by mouth every 6 (six) hours as needed for nausea or vomiting. 30 tablet 3    promethazine (PHENERGAN) 25 MG tablet Take 1 tablet (25 mg total) by mouth every 6 (six) hours as needed for nausea or vomiting. 30 tablet 0    pyridOXINE (VITAMIN B-6) 25 MG tablet Take 1 tablet (25 mg total) by mouth every 8 (eight) hours. 30 tablet 0     Review of Systems  Constitutional: Positive for fatigue. Negative for chills, diaphoresis and fever.  Eyes: Negative for visual disturbance.  Respiratory: Positive for shortness of breath.   Cardiovascular: Positive for palpitations. Negative for chest pain.  Gastrointestinal: Positive for nausea and vomiting. Negative for abdominal  pain, constipation and diarrhea.  Genitourinary: Negative for dysuria, flank pain, frequency, pelvic pain, urgency, vaginal bleeding and vaginal discharge.  Neurological: Negative for dizziness, weakness, light-headedness and headaches.   Physical Exam   Blood pressure (!) 119/53, pulse 71, temperature 98.1 F (36.7 C), resp. rate 16, weight 68.9 kg, last menstrual period 07/06/2019, SpO2 98 %.  Patient Vitals for the past 24 hrs:  BP Temp Pulse Resp SpO2 Weight  08/21/19 1900 -- -- -- -- 98 % --  08/21/19 1536 (!) 119/53 98.1 F (36.7 C) 71 16 -- --  08/21/19 1534 -- -- -- -- -- 68.9 kg   Physical Exam  Constitutional: She is oriented to person, place, and time. She appears well-developed and well-nourished. She appears lethargic. No distress.  HENT:  Head: Normocephalic and atraumatic.  Respiratory: Effort normal and breath sounds normal. She has no wheezes. She has no rales.  GI: Soft.  Neurological: She is oriented to person, place, and time. She appears lethargic.  Skin: She is not diaphoretic.  Psychiatric: Thought content normal. Her speech is slurred. She is slowed. She is inattentive.   Results for orders placed or performed during the hospital encounter of 08/21/19 (from the past 24 hour(s))  CBC     Status: Abnormal   Collection Time: 08/21/19  5:13 PM  Result Value Ref Range   WBC 8.1 4.0 - 10.5 K/uL   RBC 4.21 3.87 - 5.11 MIL/uL   Hemoglobin 10.6 (L) 12.0 - 15.0 g/dL   HCT 69.4 (L) 85.4 - 62.7 %   MCV 77.7 (L) 80.0 - 100.0 fL   MCH 25.2 (L) 26.0 - 34.0 pg   MCHC 32.4 30.0 - 36.0 g/dL   RDW 03.5 (H) 00.9 - 38.1 %   Platelets 296 150 - 400 K/uL   nRBC 0.0 0.0 - 0.2 %  Comprehensive metabolic panel     Status: Abnormal   Collection Time: 08/21/19  5:13 PM  Result Value Ref Range   Sodium 136 135 - 145 mmol/L   Potassium 3.6 3.5 - 5.1 mmol/L   Chloride 104 98 - 111 mmol/L   CO2 20 (L) 22 - 32 mmol/L   Glucose, Bld 117 (H) 70 - 99 mg/dL   BUN 10 6 - 20 mg/dL    Creatinine, Ser 8.29 0.44 - 1.00 mg/dL   Calcium 9.5 8.9 - 93.7 mg/dL   Total Protein 7.3 6.5 - 8.1 g/dL   Albumin 4.1 3.5 - 5.0 g/dL   AST 16 15 - 41 U/L   ALT 17 0 - 44 U/L   Alkaline Phosphatase 46 38 -  126 U/L   Total Bilirubin 0.8 0.3 - 1.2 mg/dL   GFR calc non Af Amer >60 >60 mL/min   GFR calc Af Amer >60 >60 mL/min   Anion gap 12 5 - 15  Urinalysis, Routine w reflex microscopic     Status: Abnormal   Collection Time: 08/21/19  6:51 PM  Result Value Ref Range   Color, Urine YELLOW YELLOW   APPearance CLEAR CLEAR   Specific Gravity, Urine 1.025 1.005 - 1.030   pH 8.0 5.0 - 8.0   Glucose, UA NEGATIVE NEGATIVE mg/dL   Hgb urine dipstick NEGATIVE NEGATIVE   Bilirubin Urine NEGATIVE NEGATIVE   Ketones, ur 80 (A) NEGATIVE mg/dL   Protein, ur 30 (A) NEGATIVE mg/dL   Nitrite NEGATIVE NEGATIVE   Leukocytes,Ua NEGATIVE NEGATIVE   RBC / HPF 0-5 0 - 5 RBC/hpf   WBC, UA 0-5 0 - 5 WBC/hpf   Bacteria, UA RARE (A) NONE SEEN   Squamous Epithelial / LPF 6-10 0 - 5   Mucus PRESENT   Urine rapid drug screen (hosp performed)     Status: Abnormal   Collection Time: 08/21/19  6:51 PM  Result Value Ref Range   Opiates NONE DETECTED NONE DETECTED   Cocaine NONE DETECTED NONE DETECTED   Benzodiazepines NONE DETECTED NONE DETECTED   Amphetamines NONE DETECTED NONE DETECTED   Tetrahydrocannabinol POSITIVE (A) NONE DETECTED   Barbiturates NONE DETECTED NONE DETECTED   MAU Course  Procedures  MDM -2nd visit to MAU in 24hrs -pt reports SOB/palpitations, burning sensation in stomach (pt identifies epigastrum as area of burning sensation), extreme fatigue along with N/V -pulse 71 -EKG: normal -consulted with Dr. Rosana Hoes, pt not to continue concurrent use of phenergan and compazine, give fluids and check electrolytes -pt unable to urinate prior to fluids -1L bolus LR given -UA: 80ketones/30PRO/rare bacteria, sending urine for culture -UDS: +THC -CBC: H/H 10.6/32.7, pt has RX for iron -CMP:  WNL -after first bag of fluids, pt reports symptoms are resolved. Pt denies any additional SOB/palpitations/burning sensation. Pt is more alert and able to converse normally. Pt has not vomited her entire visit in MAU and is able to drink water. Pt now reporting "continued headache." Will give Tylenol 1000mg . -O2: 98% -2nd LR bolus 515mL given, pt actively vomiting at start of second bolus, has not taken any medication since 10AM this morning, 12.5mg  phenergan IV ordered -Tylenol given and PO challenge successful -report given to Jorje Guild, NP @0808PM  Nugent, Gerrie Nordmann, NP  8:08 PM 08/21/2019  Orders Placed This Encounter  Procedures   Culture, OB Urine    Standing Status:   Standing    Number of Occurrences:   1   Urinalysis, Routine w reflex microscopic    Standing Status:   Standing    Number of Occurrences:   1   Urine rapid drug screen (hosp performed)    Standing Status:   Standing    Number of Occurrences:   1   CBC    Standing Status:   Standing    Number of Occurrences:   1   Comprehensive metabolic panel    Standing Status:   Standing    Number of Occurrences:   1   EKG 12-Lead    Standing Status:   Standing    Number of Occurrences:   1    Order Specific Question:   Reason for Exam    Answer:   shortness of breath   Insert peripheral IV  Standing Status:   Standing    Number of Occurrences:   1   Meds ordered this encounter  Medications   lactated ringers bolus 1,000 mL   lactated ringers bolus 1,000 mL   acetaminophen (TYLENOL) tablet 1,000 mg   promethazine (PHENERGAN) injection 12.5 mg   Assessment and Plan   1. Medication reaction, initial encounter   2. SOB (shortness of breath)   3. Palpitations   4. Epigastric burning sensation   5. Fatigue, unspecified type   6. Nausea and vomiting in pregnancy    Allergies as of 08/21/2019   No Known Allergies      Medication List     STOP taking these medications    doxylamine (Sleep) 25 MG  tablet Commonly known as: UNISOM   prochlorperazine 10 MG tablet Commonly known as: COMPAZINE       TAKE these medications    acetaminophen 500 MG tablet Commonly known as: TYLENOL Take 1 tablet (500 mg total) by mouth every 6 (six) hours as needed for mild pain or moderate pain.   famotidine 20 MG tablet Commonly known as: PEPCID Take 1 tablet (20 mg total) by mouth 2 (two) times daily.   ferrous sulfate 325 (65 FE) MG tablet Commonly known as: FerrouSul Take 1 tablet (325 mg total) by mouth 2 (two) times daily.   prenatal multivitamin Tabs tablet Take 1 tablet by mouth daily at 12 noon.   promethazine 25 MG tablet Commonly known as: PHENERGAN Take 1 tablet (25 mg total) by mouth every 6 (six) hours as needed for nausea or vomiting.   pyridOXINE 25 MG tablet Commonly known as: VITAMIN B-6 Take 1 tablet (25 mg total) by mouth every 8 (eight) hours.       -will call with culture results, if positive -pt to discontinue compazine and unisom and use phenergan only -pt advised not to smoke marijuana for N/V or other reason as this can worsen symptoms of N/V, information given on cannabis hyperemesis -safe meds in pregnancy list given -pt advised to take medications around the clock and not to stop taking if feeling better -discussed nonpharmacologic and pharmacologic treatments of N/V -discussed normal expectations for N/V in pregnancy -pt discharged to home in stable condition        Assumed care from Donia Ast FNP at 8 pm.   Pt completed second bag of IV fluids. No further vomiting.  Discussed discharge instructions with patient & her significant other who is at the bedside. Will apply scopolamine patch prior to discharge & send in prescription. Discussed using promethazine vaginally on a schedule. Will discontinue compazine.  Also stressed importance of avoiding triggers, including marijuana.  Pt has upcoming appointment with [facility].    A:  1. Cannabinoid  hyperemesis syndrome   2. Medication reaction, initial encounter   3. Nausea and vomiting in pregnancy   4. Heartburn during pregnancy in first trimester    P: Discharge home Rx scop patch D/c compazine Continue promethazine & famotadine Discussed reasons to return to MAU F/u with [facility] as scheduled  Judeth Horn, NP

## 2019-08-21 NOTE — Discharge Instructions (Signed)
Follow these instructions at home: Eating and drinking   Avoid the following: ? Drinking fluids with meals. Try not to drink anything during the 30 minutes before and after your meals. ? Drinking more than 1 cup of fluid at a time. ? Eating foods that trigger your symptoms. These may include spicy foods, coffee, high-fat foods, very sweet foods, and acidic foods. ? Skipping meals. Nausea can be more intense on an empty stomach. If you cannot tolerate food, do not force it. Try sucking on ice chips or other frozen items and make up for missed calories later. ? Lying down within 2 hours after eating. ? Being exposed to environmental triggers. These may include food smells, smoky rooms, closed spaces, rooms with strong smells, warm or humid places, overly loud and noisy rooms, and rooms with motion or flickering lights. Try eating meals in a well-ventilated area that is free of strong smells. ? Quick and sudden changes in your movement. ? Taking iron pills and multivitamins that contain iron. If you take prescription iron pills, do not stop taking them unless your health care provider approves. ? Preparing food. The smell of food can spoil your appetite or trigger nausea.  To help relieve your symptoms: ? Listen to your body. Everyone is different and has different preferences. Find what works best for you. ? Eat and drink slowly. ? Eat 5-6 small meals daily instead of 3 large meals. Eating small meals and snacks can help you avoid an empty stomach. ? In the morning, before getting out of bed, eat a couple of crackers to avoid moving around on an empty stomach. ? Try eating starchy foods as these are usually tolerated well. Examples include cereal, toast, bread, potatoes, pasta, rice, and pretzels. ? Include at least 1 serving of protein with your meals and snacks. Protein options include lean meats, poultry, seafood, beans, nuts, nut butters, eggs, cheese, and yogurt. ? Try eating a protein-rich  snack before bed. Examples of a protein-rick snack include cheese and crackers or a peanut butter sandwich made with 1 slice of whole-wheat bread and 1 tsp (5 g) of peanut butter. ? Eat or suck on things that have ginger in them. It may help relieve nausea. Add  tsp ground ginger to hot tea or choose ginger tea. ? Try drinking 100% fruit juice or an electrolyte drink. An electrolyte drink contains sodium, potassium, and chloride. ? Drink fluids that are cold, clear, and carbonated or sour. Examples include lemonade, ginger ale, lemon-lime soda, ice water, and sparkling water. ? Brush your teeth or use a mouth rinse after meals. ? Talk with your health care provider about starting a supplement of vitamin B6. General instructions  Take over-the-counter and prescription medicines only as told by your health care provider.  Follow instructions from your health care provider about eating or drinking restrictions.  Continue to take your prenatal vitamins as told by your health care provider. If you are having trouble taking your prenatal vitamins, talk with your health care provider about different options.  Keep all follow-up and pre-birth (prenatal) visits as told by your health care provider. This is important. Contact a health care provider if:  You have pain in your abdomen.  You have a severe headache.  You have vision problems.  You are losing weight.  You feel weak or dizzy. Get help right away if:  You cannot drink fluids without vomiting.  You vomit blood.  You have constant nausea and vomiting.  You are   very weak.  You faint.  You have a fever and your symptoms suddenly get worse. Summary  Making some changes to your eating habits may help relieve nausea and vomiting.  This condition may be managed with medicine.  If medicines do not help relieve nausea and vomiting, you may need to receive fluids through an IV at the hospital. This information is not intended to  replace advice given to you by your health care provider. Make sure you discuss any questions you have with your health care provider. Document Revised: 08/26/2017 Document Reviewed: 04/04/2016 [employer] Patient Education  2020 [employer]  

## 2019-08-21 NOTE — Discharge Instructions (Signed)
Marijuana Use During Pregnancy and Breastfeeding    Marijuana is the dried leaves, flowers, and stems of the Cannabis sativa or Cannabis indica plant. The plant's active ingredients (cannabinoids), including a chemical called THC, change the chemistry of the brain. Marijuana smoke also has many of the same chemicals as cigarette smoke that cause breathing problems. Marijuana gets into your blood through your lungs when you smoke it and through your digestive system when you swallow it.  Using marijuana in any form may be harmful for you and your baby when you are trying to become pregnant and during pregnancy. This includes marijuana that is prescribed to you by a health care provider (medical marijuana). Once marijuana is in your blood, it can travel through your placenta to your baby. It may also pass through breast milk.  How does this affect me?  Marijuana affects you both mentally and physically. Using marijuana can make you feel high and relaxed. It can also have negative effects, especially at high doses or with long-term use. These include:   Rapid heartbeat and stress on your heart.   Lung irritation and breathing problems.   Difficulty thinking and making decisions.   Seeing or believing things that are not true (hallucinations and paranoia).   Mood swings, depression, or anxiety.   Decreased ability to learn and remember.   Difficulty getting pregnant.  Marijuana can also affect your pregnancy. Not all the effects are known. However, if you use marijuana during pregnancy, you may:   Be less likely to get regular prenatal care and do the things that you need to do to have a healthy pregnancy.   Be more likely to use other drugs that can harm your pregnancy, like drinking alcohol and smoking cigarettes.   Be at higher risk of having your baby die after 28 weeks of pregnancy (stillbirth).   Be at higher risk of giving birth before 37 weeks of pregnancy (premature birth).  How does this affect my  baby?  If you use marijuana during pregnancy, this may affect your baby's development, birth, and life after birth. Your baby may:   Be born prematurely, which can cause physical and mental problems.   Be born with a low birth weight, which can lead to physical and mental problems.   Have problems with brain development.   Have difficulty growing.   Have attention and behavior problems later in life.   Do poorly at school and have learning problems later in life.   Have problems with vision and coordination.   Be at higher risk for using marijuana by age 14.  More research is needed to find out exactly how marijuana affects a baby during breastfeeding. Some studies suggest that the chemicals in marijuana can be passed to a baby through breast milk. To limit possible risks, you should not use marijuana during breastfeeding.  Follow these instructions at home:   Let your health care provider know if you use marijuana before trying to get pregnant, during pregnancy, or during breastfeeding.   Do not use marijuana in any form when you are trying to get pregnant, when you are pregnant, or when you are breastfeeding. If you are having trouble stopping marijuana use, ask your health care provider for help.   Do not smoke. If you need help quitting, ask your health care provider for help.   If you are using medical marijuana, ask your health care provider to switch you to a medicine that is safer to use during   [email or web address] Contact a health care provider if:  You use marijuana and want to get pregnant.  You use marijuana during pregnancy or breastfeeding.  You need help stopping marijuana use. Get help right away if:  Your baby is not gaining weight or growing as  expected. Summary  Using marijuana in any form may be harmful for you and your baby when you are trying to become pregnant, during pregnancy, and during breastfeeding. This includes marijuana that is prescribed to you (medical marijuana).  Some studies suggest that marijuana may pass through breast milk and can affect your baby's brain development.  Talk to your health care provider if you use marijuana in any form while trying to get pregnant, during pregnancy, or while breastfeeding.  Ask your health care provider for help if you are not able to stop using marijuana. This information is not intended to replace advice given to you by your health care provider. Make sure you discuss any questions you have with your health care provider. Document Revised: 11/28/2018 Document Reviewed: 04/24/2017 Elsevier Patient Education  [place] Medications in Pregnancy    Acne: Benzoyl Peroxide Salicylic Acid  Backache/Headache: Tylenol: 2 regular strength every 4 hours OR              2 Extra strength every 6 hours  Colds/Coughs/Allergies: Benadryl (alcohol free) 25 mg every 6 hours as needed Breath right strips Claritin Cepacol throat lozenges Chloraseptic throat spray Cold-Eeze- up to three times per day Cough drops, alcohol free Flonase (by prescription only) Guaifenesin Mucinex Robitussin DM (plain only, alcohol free) Saline nasal spray/drops Sudafed (pseudoephedrine) & Actifed ** use only after [redacted] weeks gestation and if you do not have high blood pressure Tylenol Vicks Vaporub Zinc lozenges Zyrtec   Constipation: Colace Ducolax suppositories Fleet enema Glycerin suppositories Metamucil Milk of magnesia Miralax Senokot Smooth move tea  Diarrhea: Kaopectate Imodium A-D  *NO pepto Bismol  Hemorrhoids: Anusol Anusol HC Preparation H Tucks  Indigestion: Tums Maalox Mylanta Zantac  Pepcid  Insomnia: Benadryl (alcohol  free) 25mg  every 6 hours as needed Tylenol PM Unisom, no Gelcaps  Leg Cramps: Tums MagGel  Nausea/Vomiting:  Bonine Dramamine Emetrol Ginger extract Sea bands Meclizine  Nausea medication to take during pregnancy:  Unisom (doxylamine succinate 25 mg tablets) Take one tablet daily at bedtime. If symptoms are not adequately controlled, the dose can be increased to a maximum recommended dose of two tablets daily (1/2 tablet in the morning, 1/2 tablet mid-afternoon and one at bedtime). Vitamin B6 100mg  tablets. Take one tablet twice a day (up to 200 mg per day).  Skin Rashes: Aveeno products Benadryl cream or 25mg  every 6 hours as needed Calamine Lotion 1% cortisone cream  Yeast infection: Gyne-lotrimin 7 Monistat 7   **If taking multiple medications, please check labels to avoid duplicating the same active ingredients **take medication as directed on the label ** Do not exceed 4000 mg of tylenol in 24 hours **Do not take medications that contain aspirin or ibuprofen    First Trimester of Pregnancy The first trimester of pregnancy is from week 1 until the end of week 13 (months 1 through 3). A week after a sperm fertilizes an egg, the egg will implant on the wall of the uterus. This embryo will begin to develop into a baby. Genes from you and your partner will form the baby. The female genes  will determine whether the baby will be a boy or a girl. At 6-8 weeks, the eyes and face will be formed, and the heartbeat can be seen on ultrasound. At the end of 12 weeks, all the baby's organs will be formed. Now that you are pregnant, you will want to do everything you can to have a healthy baby. Two of the most important things are to get good prenatal care and to follow your health care provider's instructions. Prenatal care is all the medical care you receive before the baby's birth. This care will help prevent, find, and treat any problems during the pregnancy and childbirth. Body  changes during your first trimester Your body goes through many changes during pregnancy. The changes vary from woman to woman.  You may gain or lose a couple of pounds at first.  You may feel sick to your stomach (nauseous) and you may throw up (vomit). If the vomiting is uncontrollable, call your health care provider.  You may tire easily.  You may develop headaches that can be relieved by medicines. All medicines should be approved by your health care provider.  You may urinate more often. Painful urination may mean you have a bladder infection.  You may develop heartburn as a result of your pregnancy.  You may develop constipation because certain hormones are causing the muscles that push stool through your intestines to slow down.  You may develop hemorrhoids or swollen veins (varicose veins).  Your breasts may begin to grow larger and become tender. Your nipples may stick out more, and the tissue that surrounds them (areola) may become darker.  Your gums may bleed and may be sensitive to brushing and flossing.  Dark spots or blotches (chloasma, mask of pregnancy) may develop on your face. This will likely fade after the baby is born.  Your menstrual periods will stop.  You may have a loss of appetite.  You may develop cravings for certain kinds of food.  You may have changes in your emotions from day to day, such as being excited to be pregnant or being concerned that something may go wrong with the pregnancy and baby.  You may have more vivid and strange dreams.  You may have changes in your hair. These can include thickening of your hair, rapid growth, and changes in texture. Some women also have hair loss during or after pregnancy, or hair that feels dry or thin. Your hair will most likely return to normal after your baby is born. What to expect at prenatal visits During a routine prenatal visit:  You will be weighed to make sure you and the baby are growing  normally.  Your blood pressure will be taken.  Your abdomen will be measured to track your baby's growth.  The fetal heartbeat will be listened to between weeks 10 and 14 of your pregnancy.  Test results from any previous visits will be discussed. Your health care provider may ask you:  How you are feeling.  If you are feeling the baby move.  If you have had any abnormal symptoms, such as leaking fluid, bleeding, severe headaches, or abdominal cramping.  If you are using any tobacco products, including cigarettes, chewing tobacco, and electronic cigarettes.  If you have any questions. Other tests that may be performed during your first trimester include:  Blood tests to find your blood type and to check for the presence of any previous infections. The tests will also be used to check for low iron  levels (anemia) and protein on red blood cells (Rh antibodies). Depending on your risk factors, or if you previously had diabetes during pregnancy, you may have tests to check for high blood sugar that affects pregnant women (gestational diabetes).  Urine tests to check for infections, diabetes, or protein in the urine.  An ultrasound to confirm the proper growth and development of the baby.  Fetal screens for spinal cord problems (spina bifida) and Down syndrome.  HIV (human immunodeficiency virus) testing. Routine prenatal testing includes screening for HIV, unless you choose not to have this test.  You may need other tests to make sure you and the baby are doing well. Follow these instructions at home: Medicines  Follow your health care provider's instructions regarding medicine use. Specific medicines may be either safe or unsafe to take during pregnancy.  Take a prenatal vitamin that contains at least 600 micrograms (mcg) of folic acid.  If you develop constipation, try taking a stool softener if your health care provider approves. Eating and drinking   Eat a balanced diet  that includes fresh fruits and vegetables, whole grains, good sources of protein such as meat, eggs, or tofu, and low-fat dairy. Your health care provider will help you determine the amount of weight gain that is right for you.  Avoid raw meat and uncooked cheese. These carry germs that can cause birth defects in the baby.  Eating four or five small meals rather than three large meals a day may help relieve nausea and vomiting. If you start to feel nauseous, eating a few soda crackers can be helpful. Drinking liquids between meals, instead of during meals, also seems to help ease nausea and vomiting.  Limit foods that are high in fat and processed sugars, such as fried and sweet foods.  To prevent constipation: ? Eat foods that are high in fiber, such as fresh fruits and vegetables, whole grains, and beans. ? Drink enough fluid to keep your urine clear or pale yellow. Activity  Exercise only as directed by your health care provider. Most women can continue their usual exercise routine during pregnancy. Try to exercise for 30 minutes at least 5 days a week. Exercising will help you: ? Control your weight. ? Stay in shape. ? Be prepared for labor and delivery.  Experiencing pain or cramping in the lower abdomen or lower back is a good sign that you should stop exercising. Check with your health care provider before continuing with normal exercises.  Try to avoid standing for long periods of time. Move your legs often if you must stand in one place for a long time.  Avoid heavy lifting.  Wear low-heeled shoes and practice good posture.  You may continue to have sex unless your health care provider tells you not to. Relieving pain and discomfort  Wear a good support bra to relieve breast tenderness.  Take warm sitz baths to soothe any pain or discomfort caused by hemorrhoids. Use hemorrhoid cream if your health care provider approves.  Rest with your legs elevated if you have leg cramps  or low back pain.  If you develop varicose veins in your legs, wear support hose. Elevate your feet for 15 minutes, 3-4 times a day. Limit salt in your diet. Prenatal care  Schedule your prenatal visits by the twelfth week of pregnancy. They are usually scheduled monthly at first, then more often in the last 2 months before delivery.  Write down your questions. Take them to your prenatal visits.  Keep all your prenatal visits as told by your health care provider. This is important. Safety  Wear your seat belt at all times when driving.  Make a list of emergency phone numbers, including numbers for family, friends, the hospital, and police and fire departments. General instructions  Ask your health care provider for a referral to a local prenatal education class. Begin classes no later than the beginning of month 6 of your pregnancy.  Ask for help if you have counseling or nutritional needs during pregnancy. Your health care provider can offer advice or refer you to specialists for help with various needs.  Do not use hot tubs, steam rooms, or saunas.  Do not douche or use tampons or scented sanitary pads.  Do not cross your legs for long periods of time.  Avoid cat litter boxes and soil used by cats. These carry germs that can cause birth defects in the baby and possibly loss of the fetus by miscarriage or stillbirth.  Avoid all smoking, herbs, alcohol, and medicines not prescribed by your health care provider. Chemicals in these products affect the formation and growth of the baby.  Do not use any products that contain nicotine or tobacco, such as cigarettes and e-cigarettes. If you need help quitting, ask your health care provider. You may receive counseling support and other resources to help you quit.  Schedule a dentist appointment. At home, brush your teeth with a soft toothbrush and be gentle when you floss. Contact a health care provider if:  You have dizziness.  You  have mild pelvic cramps, pelvic pressure, or nagging pain in the abdominal area.  You have persistent nausea, vomiting, or diarrhea.  You have a bad smelling vaginal discharge.  You have pain when you urinate.  You notice increased swelling in your face, hands, legs, or ankles.  You are exposed to fifth disease or chickenpox.  You are exposed to [place] measles (rubella) and have never had it. Get help right away if:  You have a fever.  You are leaking fluid from your vagina.  You have spotting or bleeding from your vagina.  You have severe abdominal cramping or pain.  You have rapid weight gain or loss.  You vomit blood or material that looks like coffee grounds.  You develop a severe headache.  You have shortness of breath.  You have any kind of trauma, such as from a fall or a car accident. Summary  The first trimester of pregnancy is from week 1 until the end of week 13 (months 1 through 3).  Your body goes through many changes during pregnancy. The changes vary from woman to woman.  You will have routine prenatal visits. During those visits, your health care provider will examine you, discuss any test results you may have, and talk with you about how you are feeling. This information is not intended to replace advice given to you by your health care provider. Make sure you discuss any questions you have with your health care provider. Document Revised: 07/19/2017 Document Reviewed: 07/18/2016 Elsevier Patient Education  2020 [employer] Inc[employer]   Hyperemesis Gravidarum Hyperemesis gravidarum is a severe form of nausea and vomiting that happens during pregnancy. Hyperemesis is worse than morning sickness. It may cause you to have nausea or vomiting all day for many days. It may keep you from eating and drinking enough food and liquids, which can lead to dehydration, malnutrition, and weight loss. Hyperemesis usually occurs during the first half (the first 20  weeks) of  pregnancy. It often goes away once a woman is in her second half of pregnancy. However, sometimes hyperemesis continues through an entire pregnancy. What are the causes? The cause of this condition is not known. It may be related to changes in chemicals (hormones) in the body during pregnancy, such as the high level of pregnancy hormone (human chorionic gonadotropin) or the increase in the female sex hormone (estrogen). What are the signs or symptoms? Symptoms of this condition include:  Nausea that does not go away.  Vomiting that does not allow you to keep any food down.  Weight loss.  Body fluid loss (dehydration).  Having no desire to eat, or not liking food that you have previously enjoyed. How is this diagnosed? This condition may be diagnosed based on:  A physical exam.  Your medical history.  Your symptoms.  Blood tests.  Urine tests. How is this treated? This condition is managed by controlling symptoms. This may include:  Following an eating plan. This can help lessen nausea and vomiting.  Taking prescription medicines. An eating plan and medicines are often used together to help control symptoms. If medicines do not help relieve nausea and vomiting, you may need to receive fluids through an IV at the hospital. Follow these instructions at home: Eating and drinking   Avoid the following: ? Drinking fluids with meals. Try not to drink anything during the 30 minutes before and after your meals. ? Drinking more than 1 cup of fluid at a time. ? Eating foods that trigger your symptoms. These may include spicy foods, coffee, high-fat foods, very sweet foods, and acidic foods. ? Skipping meals. Nausea can be more intense on an empty stomach. If you cannot tolerate food, do not force it. Try sucking on ice chips or other frozen items and make up for missed calories later. ? Lying down within 2 hours after eating. ? Being exposed to environmental triggers. These may  include food smells, smoky rooms, closed spaces, rooms with strong smells, warm or humid places, overly loud and noisy rooms, and rooms with motion or flickering lights. Try eating meals in a well-ventilated area that is free of strong smells. ? Quick and sudden changes in your movement. ? Taking iron pills and multivitamins that contain iron. If you take prescription iron pills, do not stop taking them unless your health care provider approves. ? Preparing food. The smell of food can spoil your appetite or trigger nausea.  To help relieve your symptoms: ? Listen to your body. Everyone is different and has different preferences. Find what works best for you. ? Eat and drink slowly. ? Eat 5-6 small meals daily instead of 3 large meals. Eating small meals and snacks can help you avoid an empty stomach. ? In the morning, before getting out of bed, eat a couple of crackers to avoid moving around on an empty stomach. ? Try eating starchy foods as these are usually tolerated well. Examples include cereal, toast, bread, potatoes, pasta, rice, and pretzels. ? Include at least 1 serving of protein with your meals and snacks. Protein options include lean meats, poultry, seafood, beans, nuts, nut butters, eggs, cheese, and yogurt. ? Try eating a protein-rich snack before bed. Examples of a protein-rick snack include cheese and crackers or a peanut butter sandwich made with 1 slice of whole-wheat bread and 1 tsp (5 g) of peanut butter. ? Eat or suck on things that have ginger in them. It may help relieve nausea. Add  tsp ground ginger to hot tea or choose ginger tea. ? Try drinking 100% fruit juice or an electrolyte drink. An electrolyte drink contains sodium, potassium, and chloride. ? Drink fluids that are cold, clear, and carbonated or sour. Examples include lemonade, ginger ale, lemon-lime soda, ice water, and sparkling water. ? Brush your teeth or use a mouth rinse after meals. ? Talk with your health  care provider about starting a supplement of vitamin B6. General instructions  Take over-the-counter and prescription medicines only as told by your health care provider.  Follow instructions from your health care provider about eating or drinking restrictions.  Continue to take your prenatal vitamins as told by your health care provider. If you are having trouble taking your prenatal vitamins, talk with your health care provider about different options.  Keep all follow-up and pre-birth (prenatal) visits as told by your health care provider. This is important. Contact a health care provider if:  You have pain in your abdomen.  You have a severe headache.  You have vision problems.  You are losing weight.  You feel weak or dizzy. Get help right away if:  You cannot drink fluids without vomiting.  You vomit blood.  You have constant nausea and vomiting.  You are very weak.  You faint.  You have a fever and your symptoms suddenly get worse. Summary  Hyperemesis gravidarum is a severe form of nausea and vomiting that happens during pregnancy.  Making some changes to your eating habits may help relieve nausea and vomiting.  This condition may be managed with medicine.  If medicines do not help relieve nausea and vomiting, you may need to receive fluids through an IV at the hospital. This information is not intended to replace advice given to you by your health care provider. Make sure you discuss any questions you have with your health care provider. Document Revised: 08/26/2017 Document Reviewed: 04/04/2016 Elsevier Patient Education  2020 [employer] Inc[employer]   Morning Sickness  Morning sickness is when a woman feels nauseous during pregnancy. This nauseous feeling may or may not come with vomiting. It often occurs in the morning, but it can be a problem at any time of day. Morning sickness is most common during the first trimester. In some cases, it may continue throughout  pregnancy. Although morning sickness is unpleasant, it is usually harmless unless the woman develops severe and continual vomiting (hyperemesis gravidarum), a condition that requires more intense treatment. What are the causes? The exact cause of this condition is not known, but it seems to be related to normal hormonal changes that occur in pregnancy. What increases the risk? You are more likely to develop this condition if:  You experienced nausea or vomiting before your pregnancy.  You had morning sickness during a previous pregnancy.  You are pregnant with more than one baby, such as twins. What are the signs or symptoms? Symptoms of this condition include:  Nausea.  Vomiting. How is this diagnosed? This condition is usually diagnosed based on your signs and symptoms. How is this treated? In many cases, treatment is not needed for this condition. Making some changes to what you eat may help to control symptoms. Your health care provider may also prescribe or recommend:  Vitamin B6 supplements.  Anti-nausea medicines.  Ginger. Follow these instructions at home: Medicines  Take over-the-counter and prescription medicines only as told by your health care provider. Do not use any prescription, over-the-counter, or herbal medicines for morning sickness without first talking  with your health care provider.  Taking multivitamins before getting pregnant can prevent or decrease the severity of morning sickness in most women. Eating and drinking  Eat a piece of dry toast or crackers before getting out of bed in the morning.  Eat 5 or 6 small meals a day.  Eat dry and bland foods, such as rice or a baked potato. Foods that are high in carbohydrates are often helpful.  Avoid greasy, fatty, and spicy foods.  Have someone cook for you if the smell of any food causes nausea and vomiting.  If you feel nauseous after taking prenatal vitamins, take the vitamins at night or with a  snack.  Snack on protein foods between meals if you are hungry. Nuts, yogurt, and cheese are good options.  Drink fluids throughout the day.  Try ginger ale made with real ginger, ginger tea made from fresh grated ginger, or ginger candies. General instructions  Do not use any products that contain nicotine or tobacco, such as cigarettes and e-cigarettes. If you need help quitting, ask your health care provider.  Get an air purifier to keep the air in your house free of odors.  Get plenty of fresh air.  Try to avoid odors that trigger your nausea.  Consider trying these methods to help relieve symptoms: ? Wearing an acupressure wristband. These wristbands are often worn for seasickness. ? Acupuncture. Contact a health care provider if:  Your home remedies are not working and you need medicine.  You feel dizzy or light-headed.  You are losing weight. Get help right away if:  You have persistent and uncontrolled nausea and vomiting.  You faint.  You have severe pain in your abdomen. Summary  Morning sickness is when a woman feels nauseous during pregnancy. This nauseous feeling may or may not come with vomiting.  Morning sickness is most common during the first trimester.  It often occurs in the morning, but it can be a problem at any time of day.  In many cases, treatment is not needed for this condition. Making some changes to what you eat may help to control symptoms. This information is not intended to replace advice given to you by your health care provider. Make sure you discuss any questions you have with your health care provider. Document Revised: 07/19/2017 Document Reviewed: 09/08/2016 Elsevier Patient Education  2020 [employer]

## 2019-08-21 NOTE — MAU Note (Signed)
.   Melanie Hancock is a 24 y.o. at [redacted]w[redacted]d here in MAU reporting: that she has not been able to keep anything down, was evaluated in MAU last night and given IV fluids. PT states her chest hurts from vomiting  LMP: 07/06/19 Onset of complaint: ongoing Pain score: 10 Vitals:   08/21/19 1536  BP: (!) 119/53  Pulse: 71  Resp: 16  Temp: 98.1 F (36.7 C)     FHT: Lab orders placed from triage: UA

## 2019-08-21 NOTE — L&D Delivery Note (Signed)
Delivery Note At 10:18 AM a viable and healthy female was delivered via SVD APGAR: 8, 9;  weight 6 lb 3.5 oz (2820 g).   Placenta status: Spontaneous, Intact.  Cord: 3 vessels with the following complications: None.   Anesthesia: Epidural Episiotomy: None Lacerations: None Suture Repair: NA Est. Blood Loss (mL): 200  Mom to postpartum.  Baby to Couplet care / Skin to Skin.  Victorino Dike B Shaiann Mcmanamon 03/22/2020, 12:26 PM

## 2019-08-23 LAB — CULTURE, OB URINE

## 2019-08-25 ENCOUNTER — Encounter (HOSPITAL_COMMUNITY): Payer: Self-pay | Admitting: Family Medicine

## 2019-08-25 ENCOUNTER — Inpatient Hospital Stay (HOSPITAL_COMMUNITY)
Admission: AD | Admit: 2019-08-25 | Discharge: 2019-08-25 | Disposition: A | Discharge status: Home / Self Care | Payer: Medicaid Other | Attending: Family Medicine | Admitting: Family Medicine

## 2019-08-25 ENCOUNTER — Other Ambulatory Visit: Payer: Self-pay

## 2019-08-25 DIAGNOSIS — Z87891 Personal history of nicotine dependence: Secondary | ICD-10-CM | POA: Insufficient documentation

## 2019-08-25 DIAGNOSIS — O210 Mild hyperemesis gravidarum: Secondary | ICD-10-CM | POA: Diagnosis not present

## 2019-08-25 DIAGNOSIS — O99611 Diseases of the digestive system complicating pregnancy, first trimester: Secondary | ICD-10-CM | POA: Diagnosis not present

## 2019-08-25 DIAGNOSIS — Z8759 Personal history of other complications of pregnancy, childbirth and the puerperium: Secondary | ICD-10-CM | POA: Insufficient documentation

## 2019-08-25 DIAGNOSIS — F1290 Cannabis use, unspecified, uncomplicated: Secondary | ICD-10-CM | POA: Insufficient documentation

## 2019-08-25 DIAGNOSIS — D649 Anemia, unspecified: Secondary | ICD-10-CM | POA: Insufficient documentation

## 2019-08-25 DIAGNOSIS — Z3A01 Less than 8 weeks gestation of pregnancy: Secondary | ICD-10-CM | POA: Diagnosis not present

## 2019-08-25 DIAGNOSIS — K219 Gastro-esophageal reflux disease without esophagitis: Secondary | ICD-10-CM | POA: Insufficient documentation

## 2019-08-25 DIAGNOSIS — G8929 Other chronic pain: Secondary | ICD-10-CM | POA: Diagnosis not present

## 2019-08-25 DIAGNOSIS — O99321 Drug use complicating pregnancy, first trimester: Secondary | ICD-10-CM | POA: Diagnosis not present

## 2019-08-25 DIAGNOSIS — Z79899 Other long term (current) drug therapy: Secondary | ICD-10-CM | POA: Insufficient documentation

## 2019-08-25 DIAGNOSIS — O99011 Anemia complicating pregnancy, first trimester: Secondary | ICD-10-CM | POA: Insufficient documentation

## 2019-08-25 LAB — URINALYSIS, COMPLETE (UACMP) WITH MICROSCOPIC
Bilirubin Urine: NEGATIVE
Glucose, UA: NEGATIVE mg/dL
Hgb urine dipstick: NEGATIVE
Ketones, ur: 80 mg/dL — AB
Leukocytes,Ua: NEGATIVE
Nitrite: NEGATIVE
Protein, ur: 30 mg/dL — AB
Specific Gravity, Urine: 1.026 (ref 1.005–1.030)
pH: 7 (ref 5.0–8.0)

## 2019-08-25 LAB — COMPREHENSIVE METABOLIC PANEL
ALT: 29 U/L (ref 0–44)
AST: 19 U/L (ref 15–41)
Albumin: 4 g/dL (ref 3.5–5.0)
Alkaline Phosphatase: 52 U/L (ref 38–126)
Anion gap: 12 (ref 5–15)
BUN: 13 mg/dL (ref 6–20)
CO2: 22 mmol/L (ref 22–32)
Calcium: 9.3 mg/dL (ref 8.9–10.3)
Chloride: 101 mmol/L (ref 98–111)
Creatinine, Ser: 0.84 mg/dL (ref 0.44–1.00)
GFR calc Af Amer: >60 mL/min (ref >60)
GFR calc non Af Amer: >60 mL/min (ref >60)
Glucose, Bld: 90 mg/dL (ref 70–99)
Potassium: 3.5 mmol/L (ref 3.5–5.1)
Sodium: 135 mmol/L (ref 135–145)
Total Bilirubin: 0.9 mg/dL (ref 0.3–1.2)
Total Protein: 7.8 g/dL (ref 6.5–8.1)

## 2019-08-25 LAB — RAPID URINE DRUG SCREEN, HOSP PERFORMED
Amphetamines: NOT DETECTED
Barbiturates: NOT DETECTED
Benzodiazepines: NOT DETECTED
Cocaine: NOT DETECTED
Opiates: NOT DETECTED
Tetrahydrocannabinol: POSITIVE — AB

## 2019-08-25 LAB — CBC
HCT: 39.1 % (ref 36.0–46.0)
Hemoglobin: 12.7 g/dL (ref 12.0–15.0)
MCH: 25.1 pg — ABNORMAL LOW (ref 26.0–34.0)
MCHC: 32.5 g/dL (ref 30.0–36.0)
MCV: 77.4 fL — ABNORMAL LOW (ref 80.0–100.0)
Platelets: 349 10*3/uL (ref 150–400)
RBC: 5.05 MIL/uL (ref 3.87–5.11)
RDW: 16.6 % — ABNORMAL HIGH (ref 11.5–15.5)
WBC: 6.8 10*3/uL (ref 4.0–10.5)
nRBC: 0 % (ref 0.0–0.2)

## 2019-08-25 MED ORDER — METOCLOPRAMIDE HCL 10 MG PO TABS: 10.0000 mg | ORAL_TABLET | Freq: Four times a day (QID) | ORAL | 1 refills | Status: DC

## 2019-08-25 MED ORDER — LACTATED RINGERS IV BOLUS
500.0000 mL | Freq: Once | INTRAVENOUS | Status: AC
  Administered 2019-08-25: 500 mL via INTRAVENOUS

## 2019-08-25 MED ORDER — FAMOTIDINE IN NACL 20-0.9 MG/50ML-% IV SOLN
20.0000 mg | Freq: Once | INTRAVENOUS | Status: AC
  Administered 2019-08-25: 20 mg via INTRAVENOUS
  Filled 2019-08-25: qty 50

## 2019-08-25 MED ORDER — SCOPOLAMINE 1 MG/3DAYS TD PT72
1.0000 | MEDICATED_PATCH | TRANSDERMAL | Status: DC
  Administered 2019-08-25: 1.5 mg via TRANSDERMAL
  Filled 2019-08-25: qty 1

## 2019-08-25 MED ORDER — LACTATED RINGERS IV BOLUS: 1000.0000 mL | Freq: Once | INTRAVENOUS | Status: DC

## 2019-08-25 MED ORDER — SCOPOLAMINE 1 MG/3DAYS TD PT72: 1.0000 | MEDICATED_PATCH | TRANSDERMAL | 12 refills | Status: DC

## 2019-08-25 MED ORDER — SODIUM CHLORIDE 0.9 % IV SOLN
8.0000 mg | Freq: Once | INTRAVENOUS | Status: AC
  Administered 2019-08-25: 8 mg via INTRAVENOUS
  Filled 2019-08-25: qty 4

## 2019-08-25 MED ORDER — PROMETHAZINE HCL 25 MG/ML IJ SOLN
12.5000 mg | Freq: Once | INTRAMUSCULAR | Status: AC
  Administered 2019-08-25: 12.5 mg via INTRAVENOUS
  Filled 2019-08-25: qty 1

## 2019-08-25 MED ORDER — METOCLOPRAMIDE HCL 5 MG/ML IJ SOLN
10.0000 mg | Freq: Once | INTRAMUSCULAR | Status: AC
  Administered 2019-08-25: 10 mg via INTRAVENOUS
  Filled 2019-08-25: qty 2

## 2019-08-25 MED ORDER — LACTATED RINGERS IV BOLUS
1000.0000 mL | Freq: Once | INTRAVENOUS | Status: AC
  Administered 2019-08-25: 1000 mL via INTRAVENOUS

## 2019-08-25 MED ORDER — FLEET ENEMA 7-19 GM/118ML RE ENEM
1.0000 | ENEMA | Freq: Once | RECTAL | Status: AC
  Administered 2019-08-25: 1 via RECTAL

## 2019-08-25 MED ORDER — METOCLOPRAMIDE HCL 5 MG/ML IJ SOLN: 10.0000 mg | Freq: Once | INTRAMUSCULAR | Status: DC

## 2019-08-25 MED ORDER — GLYCOPYRROLATE 1 MG PO TABS: 1.0000 mg | ORAL_TABLET | Freq: Three times a day (TID) | ORAL | 1 refills | Status: DC

## 2019-08-25 NOTE — MAU Note (Signed)
Pt brought back from lobby hunched over in wheelchair states, "I'm going to pass out." Brought to room and unresponsive at first.  Team in room to help.  Pt then assisted into bed and reports that she has lost 10lbs in the last week and can't keep anything down.  "Meds aren't working."  Reports abdominal pain.

## 2019-08-25 NOTE — Discharge Instructions (Signed)

## 2019-08-25 NOTE — MAU Provider Note (Signed)
Patient Melanie Hancock is a 24 y.o. [id number] Here with epigastric pain and nausea. She denies vaginal bleeding, pain with urination, abnormal discharge. She threw up 3 times yesterday. She last took her phenergan yesterday. She has not picked up her scopalamine patch yet because she is waiting for prior authorization.   She reports taking her nausea medicine "One of them is morning and night and one of them is every 6 hours". She tried eating soup yesterday but she could not keep it down.   History     CSN: [id number]  Arrival date and time: 08/25/19 1130   First Provider Initiated Contact with Patient 08/25/19 1146      Chief Complaint  Patient presents with  . Abdominal Pain  . Nausea   Abdominal Pain This is a new problem. The current episode started in the past 7 days. The problem occurs constantly. The problem has been unchanged. The pain is located in the epigastric region. The pain is at a severity of 9/10. The quality of the pain is burning and aching. The abdominal pain radiates to the epigastric region (the pain is in her uppper abdomen, it spreads to her epigastric region. ).    OB History    Gravida  5   Para  3   Term  3   Preterm      AB  1   Living  3     SAB  0   TAB  1   Ectopic      Multiple  0   Live Births  3           Past Medical History:  Diagnosis Date  . Anemia   . Anxiety   . Blood transfusion without reported diagnosis   . Chronic back pain   . Depression   . GERD (gastroesophageal reflux disease)   . Infection    UTI  . Post partum depression   . Pyelonephritis   . UTI (lower urinary tract infection)     Past Surgical History:  Procedure Laterality Date  . DILATION AND CURETTAGE OF UTERUS    . INDUCED ABORTION    . WISDOM TOOTH EXTRACTION      Family History  Problem Relation Age of Onset  . Arthritis Mother   . Healthy Father   . Arthritis Maternal Grandmother   . Asthma Son   . Alcohol abuse Neg Hx      Social History   Tobacco Use  . Smoking status: Former Smoker    Quit date: 01/21/2014    Years since quitting: 5.5  . Smokeless tobacco: Never Used  Substance Use Topics  . Alcohol use: Yes    Comment: rarely  . Drug use: No    Allergies: No Known Allergies  Medications Prior to Admission  Medication Sig Dispense Refill Last Dose  . scopolamine (TRANSDERM-SCOP, 1.5 MG,) 1 MG/3DAYS Place 1 patch (1.5 mg total) onto the skin every 3 (three) days for 4 doses. 4 patch 0 Past Week at Unknown time  . acetaminophen (TYLENOL) 500 MG tablet Take 1 tablet (500 mg total) by mouth every 6 (six) hours as needed for mild pain or moderate pain. 30 tablet 0   . famotidine (PEPCID) 20 MG tablet Take 1 tablet (20 mg total) by mouth 2 (two) times daily. 30 tablet 0   . ferrous sulfate (FERROUSUL) 325 (65 FE) MG tablet Take 1 tablet (325 mg total) by mouth 2 (two) times daily. 60 tablet 1   .  Prenatal Vit-Fe Fumarate-FA (PRENATAL MULTIVITAMIN) TABS tablet Take 1 tablet by mouth daily at 12 noon.     . promethazine (PHENERGAN) 25 MG tablet Take 1 tablet (25 mg total) by mouth every 6 (six) hours as needed for nausea or vomiting. 30 tablet 0   . pyridOXINE (VITAMIN B-6) 25 MG tablet Take 1 tablet (25 mg total) by mouth every 8 (eight) hours. 30 tablet 0     Review of Systems  Constitutional: Negative.   HENT: Negative.   Respiratory: Negative.   Gastrointestinal: Positive for abdominal pain.  Genitourinary: Negative.   Musculoskeletal: Negative.   Psychiatric/Behavioral: Negative.    Physical Exam   Blood pressure 133/70, pulse 66, resp. rate 18, last menstrual period 07/06/2019, SpO2 100 %.  Physical Exam  Constitutional: She is oriented to person, place, and time. She appears well-developed and well-nourished.  HENT:  Head: Normocephalic.  Respiratory: Effort normal.  GI: Soft. Bowel sounds are normal. She exhibits no distension and no mass. There is no abdominal tenderness. There is no  rebound and no guarding.  Musculoskeletal:        General: Normal range of motion.     Cervical back: Normal range of motion.  Neurological: She is alert and oriented to person, place, and time.  Skin: Skin is warm and dry.    MAU Course  Procedures  MDM IV LR bolus x 2 -IV zofran and pepcid -CBC and CMP normal -UDS positivie for THC  1430: patient still feels no relief and has been intermittently spitting, would like to try  Enema; will also offer reglan and phenergan.  1615: patient is sleeping in bed 1630: patient ambulating in the room, wants RN to assist her with enema.  1715: patient has passed a little bowel movement, still under the covers trying to sleep. Partner is asking if he can leave and come back.  Explained that we are going to begin the discharge process.   -11 lbs weight loss since 12/17, however, patient has not consistently been taking her anti-emetics and has not tried reglan and Zofran yet. Appropriate to trial these antiemetics.   Assessment and Plan   1. Morning sickness    2. -advise patient to take phenergan vaginally to help with nausea -Zofran ODT every 8 hours -stop cannabis use as that will make abdominal pain worse.  -continue taking pepcid -discontinue iron for now as it may be continuing to her stomach pain.  -Patient stable for discharge, all questions answered.  Mervyn Skeeters Clifton Safley 08/25/2019, 1:15 PM

## 2019-08-26 ENCOUNTER — Telehealth: Payer: Self-pay

## 2019-08-26 ENCOUNTER — Encounter: Payer: Self-pay | Admitting: *Deleted

## 2019-08-26 NOTE — Telephone Encounter (Signed)
[employer] contacted. Prior authorization completed for Scopolamine patch. PA #[id number]; valid 08/26/19-08/20/20.   Called [facility] pharmacy. Notified staff member Sharyl Nimrod that prior authorization has been completed for Scopolamine patch.

## 2019-08-27 ENCOUNTER — Other Ambulatory Visit: Payer: Self-pay

## 2019-08-27 ENCOUNTER — Ambulatory Visit (HOSPITAL_COMMUNITY)
Admission: RE | Admit: 2019-08-27 | Discharge: 2019-08-27 | Disposition: A | Discharge status: Home / Self Care | Payer: Medicaid Other | Source: Ambulatory Visit | Attending: Advanced Practice Midwife | Admitting: Advanced Practice Midwife

## 2019-08-27 ENCOUNTER — Ambulatory Visit (INDEPENDENT_AMBULATORY_CARE_PROVIDER_SITE_OTHER): Payer: Medicaid Other | Admitting: General Practice

## 2019-08-27 DIAGNOSIS — O3680X0 Pregnancy with inconclusive fetal viability, not applicable or unspecified: Secondary | ICD-10-CM

## 2019-08-27 DIAGNOSIS — Z712 Person consulting for explanation of examination or test findings: Secondary | ICD-10-CM

## 2019-08-27 NOTE — Progress Notes (Signed)
Patient presents to office today for viability ultrasound results. Reviewed results with Dr Debroah Loop who finds single living [facility]UP, patient should begin prenatal care.   Informed patient of results, reviewed dating, & provided pictures. Patient has initial OB visit scheduled with [facility] on 1/29.  Chase Caller RN BSN 08/27/19

## 2019-09-01 ENCOUNTER — Ambulatory Visit (HOSPITAL_COMMUNITY)
Admission: EM | Admit: 2019-09-01 | Discharge: 2019-09-01 | Disposition: A | Discharge status: Home / Self Care | Payer: Medicaid Other

## 2019-09-01 ENCOUNTER — Encounter (HOSPITAL_COMMUNITY): Payer: Self-pay | Admitting: Emergency Medicine

## 2019-09-01 ENCOUNTER — Other Ambulatory Visit: Payer: Self-pay

## 2019-09-01 DIAGNOSIS — H9202 Otalgia, left ear: Secondary | ICD-10-CM | POA: Diagnosis not present

## 2019-09-01 DIAGNOSIS — R6884 Jaw pain: Secondary | ICD-10-CM

## 2019-09-01 NOTE — ED Triage Notes (Signed)
Seen 12/27 for the same, but this time it is not as bad.  Pain continues in left jaw, left ear.

## 2019-09-01 NOTE — Discharge Instructions (Signed)
Continue tylenol as previously directed.  Utilize heat and ice alternating as preferred.  I believe this will just take some time to resolve.  If you have no improvement in 2 weeks, follow up with your primary care provider.
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
## 2019-09-15 ENCOUNTER — Encounter: Payer: Self-pay | Admitting: Emergency Medicine

## 2019-09-15 ENCOUNTER — Other Ambulatory Visit: Payer: Self-pay

## 2019-09-15 ENCOUNTER — Ambulatory Visit
Admission: EM | Admit: 2019-09-15 | Discharge: 2019-09-15 | Disposition: A | Discharge status: Home / Self Care | Payer: Medicaid Other

## 2019-09-15 ENCOUNTER — Telehealth: Payer: Medicaid Other

## 2019-09-15 ENCOUNTER — Telehealth: Payer: Self-pay | Admitting: Lactation Services

## 2019-09-15 DIAGNOSIS — R110 Nausea: Secondary | ICD-10-CM

## 2019-09-15 DIAGNOSIS — G44209 Tension-type headache, unspecified, not intractable: Secondary | ICD-10-CM

## 2019-09-15 DIAGNOSIS — M545 Low back pain: Secondary | ICD-10-CM | POA: Diagnosis not present

## 2019-09-15 DIAGNOSIS — Z3A10 10 weeks gestation of pregnancy: Secondary | ICD-10-CM

## 2019-09-15 MED ORDER — ONDANSETRON HCL 4 MG PO TABS: 4.0000 mg | ORAL_TABLET | Freq: Three times a day (TID) | ORAL | 0 refills | Status: DC | PRN

## 2019-09-15 MED ORDER — PROMETHAZINE HCL 25 MG RE SUPP: 25.0000 mg | Freq: Four times a day (QID) | RECTAL | 0 refills | Status: DC | PRN

## 2019-09-15 NOTE — ED Provider Notes (Signed)
[facility]    CSN: [id number] Arrival date & time: 09/15/19  1411      History   Chief Complaint Chief Complaint  Patient presents with  . Headache  . Nausea    HPI Melanie Hancock is a 24 y.o. female [redacted] weeks gestation presenting for persistent nausea, vomiting.  Patient states she has taken numerous antiemetics including Zofran, Reglan, promethazine and GERD medication such as famotidine without significant relief.  Has not tried ginger products, vitamin B6.  States that she has care currently with 2 different OB/GYN offices, though one was for gynecologic care; states that she has an appointment with her OB provider tomorrow and was able to discuss with one of the nurses her symptoms earlier today on the phone.  States that she received a call from them while waiting for me in UC office today.  States that they feel current issue is her taking Reglan and promethazine together which is not advised.  Patient does have history of cannabinoid hyperemesis syndrome: Denies current use.  Patient states she is also bothered by her back which is been hurting since she has been throwing up so much as well as a tension headache.  Denies change in vision, worst headache of life, tinnitus, dizziness.  Has not take anything for headache.  Last emesis was earlier this morning without blood or bile.  Patient denies fever, abdominal or pelvic pain, vaginal bleeding or discharge.  No chest pain, shortness of breath.   Past Medical History:  Diagnosis Date  . Anemia   . Anxiety   . Blood transfusion without reported diagnosis   . Chronic back pain   . Depression   . GERD (gastroesophageal reflux disease)   . Infection    UTI  . Post partum depression   . Pyelonephritis   . UTI (lower urinary tract infection)     Patient Active Problem List   Diagnosis Date Noted  . Vaginal delivery 02/04/2016  . History of postpartum depression, currently pregnant 01/23/2016  . Iron deficiency  anemia 01/02/2016  . 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 Trichomoniasis 06/17/2015  . Anxiety 03/28/2014  . Chronic back pain 03/28/2014  . Hx pyelonephritis 2014 05/26/2013    Past Surgical History:  Procedure Laterality Date  . DILATION AND CURETTAGE OF UTERUS    . INDUCED ABORTION    . WISDOM TOOTH EXTRACTION      OB History    Gravida  5   Para  3   Term  3   Preterm      AB  1   Living  3     SAB  0   TAB  1   Ectopic      Multiple  0   Live Births  3            Home Medications    Prior to Admission medications   Medication Sig Start Date End Date Taking? Authorizing Provider  acetaminophen (TYLENOL) 500 MG tablet Take 1 tablet (500 mg total) by mouth every 6 (six) hours as needed for mild pain or moderate pain. 08/16/19   Wieters, Hallie C, PA-C  famotidine (PEPCID) 20 MG tablet Take 1 tablet (20 mg total) by mouth 2 (two) times daily. 08/20/19   Calvert Cantor, CNM  ferrous sulfate (FERROUSUL) 325 (65 FE) MG tablet Take 1 tablet (325 mg total) by mouth 2 (two) times daily. 08/07/19   Aviva Signs, CNM  glycopyrrolate (ROBINUL) 1 MG tablet Take 1 tablet (1 mg total) by mouth 3 (three) times daily. 08/25/19   Marylene Land, CNM  hydrocortisone-pramoxine [facility]) rectal foam Place 1 applicator rectally 2 (two) times daily. 09/20/19   Gerrit Heck, CNM  metoCLOPramide (REGLAN) 10 MG tablet Take 1 tablet (10 mg total) by mouth every 6 (six) hours. 08/25/19   Marylene Land, CNM  omeprazole (PRILOSEC) 20 MG capsule Take 20 mg by mouth daily.    [provider]  ondansetron (ZOFRAN) 4 MG tablet Take 1 tablet (4 mg total) by mouth every 8 (eight) hours as needed for nausea or vomiting. 09/15/19   Marylene Land, CNM  pantoprazole (PROTONIX) 20 MG tablet Take 1 tablet (20 mg total) by mouth daily. 09/17/19   Aviva Signs, CNM  Prenatal Vit-Fe Fumarate-FA (PRENATAL  MULTIVITAMIN) TABS tablet Take 1 tablet by mouth daily at 12 noon.    [provider]  promethazine (PHENADOZ) 25 MG suppository Place 1 suppository (25 mg total) rectally every 6 (six) hours as needed for nausea or vomiting. 09/15/19   Marylene Land, CNM  scopolamine (TRANSDERM-SCOP) 1 MG/3DAYS Place 1 patch (1.5 mg total) onto the skin every 3 (three) days. 08/28/19   Marylene Land, CNM    Family History Family History  Problem Relation Age of Onset  . Arthritis Mother   . Healthy Father   . Arthritis Maternal Grandmother   . Asthma Son   . Alcohol abuse Neg Hx     Social History Social History   Tobacco Use  . Smoking status: Former Smoker    Quit date: 01/21/2014    Years since quitting: 5.6  . Smokeless tobacco: Never Used  Substance Use Topics  . Alcohol use: Yes    Comment: rarely  . Drug use: Not Currently    Types: Marijuana    Comment: 09/15/2019     Allergies   Patient has no known allergies.   Review of Systems As per HPI   Physical Exam Triage Vital Signs ED Triage Vitals [09/15/19 1430]  Enc Vitals Group     BP 125/81     Pulse Rate 84     Resp      Temp 98 F (36.7 C)     Temp Source Temporal     SpO2      Weight      Height      Head Circumference      Peak Flow      Pain Score 10     Pain Loc      Pain Edu?      Excl. in GC?    No data found.  Updated Vital Signs BP 125/81 (BP Location: Left Arm)   Pulse 84   Temp 98 F (36.7 C) (Temporal)   LMP 07/06/2019   Visual Acuity Right Eye Distance:   Left Eye Distance:   Bilateral Distance:    Right Eye Near:   Left Eye Near:    Bilateral Near:     Physical Exam Constitutional:      General: She is not in acute distress.    Appearance: She is not ill-appearing.  HENT:     Head: Normocephalic and atraumatic.     Mouth/Throat:     Mouth: Mucous membranes are moist.     Pharynx: Oropharynx is clear.  Eyes:     General: No scleral icterus.     Pupils: Pupils are equal, round, and reactive  to light.  Cardiovascular:     Rate and Rhythm: Normal rate and regular rhythm.  Pulmonary:     Effort: Pulmonary effort is normal. No respiratory distress.     Breath sounds: No wheezing or rales.  Abdominal:     General: Bowel sounds are normal.     Tenderness: There is no abdominal tenderness.  Musculoskeletal:        General: Normal range of motion.     Cervical back: Neck supple.     Right lower leg: No edema.     Left lower leg: No edema.  Lymphadenopathy:     Cervical: No cervical adenopathy.  Skin:    Capillary Refill: Capillary refill takes less than 2 seconds.     Coloration: Skin is not jaundiced or pale.     Findings: No rash.  Neurological:     General: No focal deficit present.     Mental Status: She is alert and oriented to person, place, and time.      UC Treatments / Results  Labs (all labs ordered are listed, but only abnormal results are displayed) Labs Reviewed - No data to display  EKG   Radiology No results found.  Procedures Procedures (including critical care time)  Medications Ordered in UC Medications - No data to display  Initial Impression / Assessment and Plan / UC Course  I have reviewed the triage vital signs and the nursing notes.  Pertinent labs & imaging results that were available during my care of the patient were reviewed by me and considered in my medical decision making (see chart for details).    Patient afebrile, nontoxic in office today.  Appears well-hydrated and has follow-up tomorrow with OB-intends to keep this appointment.  Labs from 08/25/2019: Hemoglobin 12.7, MCV 77.4 patient is without signs/symptoms of active bleeding.  Provided handout of OTC medications that are safe in pregnancy for patient to review, will continue current home medications as instructed by OB and try adding heating pads to current regimen for back pain which appears benign/musculoskeletal.  Return  precautions discussed, patient verbalized understanding and is agreeable to plan. Final Clinical Impressions(s) / UC Diagnoses   Final diagnoses:  [redacted] weeks gestation of pregnancy  Tension headache  Nausea without vomiting     Discharge Instructions     Try heating pads for back pain. Keep appointment with OB tomorrow!    ED Prescriptions    None     PDMP not reviewed this encounter.   Hall-Potvin, [place], PA-C 09/20/19 0800

## 2019-09-15 NOTE — Telephone Encounter (Signed)
Pt called and LM of nurse voicemail with questions about her medications.   Pt reports she is currently pregnant. She has been in and out of the hospital for nausea and burning in her stomach.   She has several medications prescribed for the N & V since Mid December.   Pt has been taking the medication Compazine and Diclegis and she had difficulty breathing.   She reports the nausea remains and her stomach is still burning.   Pharmacy told her not to take Reglan and Promethazine along with each other.   She reports she does not have the patch, she is planning to call the pharmacy back. She felt like that helped her the most. She has not been taking the Robinol. She has not been taking her Fe pills. Some days she can take her PNV and sometimes she cannot.   She reports she is currently taking the Reglan and the Promethazine. She sometimes rotates the Reglan and Pepcid to see which one helps the most, she feels they are the same.   Pt reports she is not eating well. She reports she cannot eat until 2-3 o'clock and is only able to keep down a sandwich.   She has tried crackers before getting up in the morning. She is able to eat a few and still is very nauseous. Pt has been able to drink ginger ale. Pt reports she is voiding 5 x in 24 hours. She reports her urine color varies from light -Medium yellow.  She reports she used Zofran with her other 3 children and that helped her. She has some Zofran at home that is expired. She reports she tried if a few times and it did not work.   She has a headache for 2 weeks that is not improving, she reports she is seeing stars and dizziness at time, not every day. She denies a history of HTN, she reports she does have headaches with pregnancy. She has tried a 1/2 cans Pepsi for caffeine for 2 days and did not notice a change in her headache. She has tried Tylenol and it did not help. Pt has Urgent Care appt at 2 pm today.   Pt reports she has Prilosec at  home also, she is not currently taking.   Reviewed with Luna Kitchens, CNM. She reports Pt can have Phenergan Suppositories and insert them vaginally. Pt was ordered Zofran and was informed she can take along with Reglan or prilosec that she has at home.   Pt reports she used Suppositories with a previous pregnancy and they did help . Pt to call with further questions or concerns as needed.

## 2019-09-15 NOTE — ED Triage Notes (Signed)
Pt presents to [facility] for 2 weeks of headache and nausea that she has been in and out of the hospital for.  States she is [redacted] weeks pregnant, and has her first OB appointment tomorrow.  States she has been having episodes of "seeing stars", regardless of position or activity level.  States she is trying reglan, pepcid, promethazine, for nausea, without much relief.  States she can't eat in the mornings, and is too fatigued from not eating to work.  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  Also states hx of anemia.

## 2019-09-15 NOTE — Discharge Instructions (Addendum)
Try heating pads for back pain. Keep appointment with OB tomorrow!

## 2019-09-16 ENCOUNTER — Encounter (HOSPITAL_COMMUNITY): Payer: Self-pay | Admitting: Obstetrics & Gynecology

## 2019-09-16 ENCOUNTER — Other Ambulatory Visit: Payer: Self-pay

## 2019-09-16 ENCOUNTER — Inpatient Hospital Stay (HOSPITAL_COMMUNITY)
Admission: AD | Admit: 2019-09-16 | Discharge: 2019-09-17 | Disposition: A | Discharge status: Home / Self Care | Payer: Medicaid Other | Attending: Obstetrics and Gynecology | Admitting: Obstetrics and Gynecology

## 2019-09-16 DIAGNOSIS — O211 Hyperemesis gravidarum with metabolic disturbance: Secondary | ICD-10-CM | POA: Insufficient documentation

## 2019-09-16 DIAGNOSIS — R1013 Epigastric pain: Secondary | ICD-10-CM | POA: Insufficient documentation

## 2019-09-16 DIAGNOSIS — O219 Vomiting of pregnancy, unspecified: Secondary | ICD-10-CM

## 2019-09-16 DIAGNOSIS — O210 Mild hyperemesis gravidarum: Secondary | ICD-10-CM | POA: Diagnosis present

## 2019-09-16 DIAGNOSIS — Z3A10 10 weeks gestation of pregnancy: Secondary | ICD-10-CM | POA: Diagnosis not present

## 2019-09-16 DIAGNOSIS — Z87891 Personal history of nicotine dependence: Secondary | ICD-10-CM | POA: Insufficient documentation

## 2019-09-16 DIAGNOSIS — E860 Dehydration: Secondary | ICD-10-CM

## 2019-09-16 LAB — URINALYSIS, ROUTINE W REFLEX MICROSCOPIC
Bilirubin Urine: NEGATIVE
Glucose, UA: NEGATIVE mg/dL
Hgb urine dipstick: NEGATIVE
Ketones, ur: 80 mg/dL — AB
Leukocytes,Ua: NEGATIVE
Nitrite: NEGATIVE
Protein, ur: 100 mg/dL — AB
Specific Gravity, Urine: 1.024 (ref 1.005–1.030)
pH: 7 (ref 5.0–8.0)

## 2019-09-16 LAB — OB RESULTS CONSOLE GC/CHLAMYDIA
Chlamydia: NEGATIVE
Gonorrhea: NEGATIVE

## 2019-09-16 LAB — RAPID URINE DRUG SCREEN, HOSP PERFORMED
Amphetamines: NOT DETECTED
Barbiturates: NOT DETECTED
Benzodiazepines: NOT DETECTED
Cocaine: NOT DETECTED
Opiates: NOT DETECTED
Tetrahydrocannabinol: POSITIVE — AB

## 2019-09-16 LAB — OB RESULTS CONSOLE HIV ANTIBODY (ROUTINE TESTING): HIV: NONREACTIVE

## 2019-09-16 LAB — OB RESULTS CONSOLE RUBELLA ANTIBODY, IGM: Rubella: IMMUNE

## 2019-09-16 MED ORDER — LACTATED RINGERS IV SOLN: Freq: Once | INTRAVENOUS | Status: AC

## 2019-09-16 MED ORDER — PROMETHAZINE HCL 25 MG/ML IJ SOLN
25.0000 mg | Freq: Once | INTRAMUSCULAR | Status: AC
  Administered 2019-09-16: 25 mg via INTRAMUSCULAR
  Filled 2019-09-16: qty 1

## 2019-09-16 MED ORDER — ONDANSETRON HCL 4 MG/2ML IJ SOLN
4.0000 mg | Freq: Once | INTRAMUSCULAR | Status: AC
  Administered 2019-09-16: 21:00:00 4 mg via INTRAVENOUS
  Filled 2019-09-16: qty 2

## 2019-09-16 MED ORDER — LACTATED RINGERS IV BOLUS
1000.0000 mL | Freq: Once | INTRAVENOUS | Status: AC
  Administered 2019-09-16: 21:00:00 1000 mL via INTRAVENOUS

## 2019-09-16 MED ORDER — SCOPOLAMINE 1 MG/3DAYS TD PT72
1.0000 | MEDICATED_PATCH | Freq: Once | TRANSDERMAL | Status: DC
  Filled 2019-09-16: qty 1

## 2019-09-16 MED ORDER — DEXAMETHASONE SODIUM PHOSPHATE 10 MG/ML IJ SOLN
10.0000 mg | Freq: Once | INTRAMUSCULAR | Status: AC
  Administered 2019-09-16: 22:00:00 10 mg via INTRAVENOUS
  Filled 2019-09-16: qty 1

## 2019-09-16 MED ORDER — FAMOTIDINE IN NACL 20-0.9 MG/50ML-% IV SOLN
20.0000 mg | Freq: Once | INTRAVENOUS | Status: AC
  Administered 2019-09-16: 21:00:00 20 mg via INTRAVENOUS
  Filled 2019-09-16: qty 50

## 2019-09-16 NOTE — MAU Note (Signed)
Pt vomited small amt. Mostly dry heaves

## 2019-09-16 NOTE — MAU Provider Note (Addendum)
Chief Complaint: Emesis   First Provider Initiated Contact with Patient 09/16/19 1802     SUBJECTIVE HPI: Melanie Hancock is a 24 y.o. [id number] at [redacted]w[redacted]d who presents to [facility] via EMS reporting nausea & vomiting. Has been seen in MAU previously for n/v & cannabinoid hyperemesis. Symptoms well controlled until this morning. Reports vomiting 6 times per day. Took phenergan suppository this morning and zofran this afternoon. Also reports epigastric discomfort. Last smoked marijuana yesterday or the day before. Tried to eat crackers this morning but couldn't keep it down. Had her first OB appointment with [facility] today. No lower abdominal pain or vaginal bleeding.   Location: epigastric Quality: dull, pressure Severity: 10/10 on pain scale Duration: <1 day Timing: constant Modifying factors: worse with vomiting Associated signs and symptoms: nausea & vomiting  Past Medical History:  Diagnosis Date  . Anemia   . Anxiety   . Blood transfusion without reported diagnosis   . Chronic back pain   . Depression   . GERD (gastroesophageal reflux disease)   . Infection    UTI  . Post partum depression   . Pyelonephritis   . UTI (lower urinary tract infection)    OB History  Gravida Para Term Preterm AB Living  5 3 3   1 3   SAB TAB Ectopic Multiple Live Births  0 1   0 3    # Outcome Date GA Lbr Len/2nd Weight Sex Delivery Anes PTL Lv  5 Current           4 Term 02/04/16 [redacted]w[redacted]d 56:55 / 00:15 3745 g M Vag-Spont EPI  LIV  3 Term 11/20/14 [redacted]w[redacted]d 01:14 / 00:08 3260 g M Vag-Spont None  LIV  2 Term 02/11/10    M Vag-Spont   LIV  1 TAB              Birth Comments: System Generated. Please review and update pregnancy details.   Past Surgical History:  Procedure Laterality Date  . DILATION AND CURETTAGE OF UTERUS    . INDUCED ABORTION    . WISDOM TOOTH EXTRACTION     Social History   Socioeconomic History  . Marital status: Single    Spouse name: Not on file  . Number of  children: Not on file  . Years of education: Not on file  . Highest education level: Not on file  Occupational History  . Not on file  Tobacco Use  . Smoking status: Former Smoker    Quit date: 01/21/2014    Years since quitting: 5.6  . Smokeless tobacco: Never Used  Substance and Sexual Activity  . Alcohol use: Yes    Comment: rarely  . Drug use: Yes    Types: Marijuana    Comment: 09/15/2019  . Sexual activity: Yes    Birth control/protection: None  Other Topics Concern  . Not on file  Social History Narrative  . Not on file   Social Determinants of Health   Financial Resource Strain:   . Difficulty of Paying Living Expenses: Not on file  Food Insecurity:   . Worried About Charity fundraiser in the Last Year: Not on file  . Ran Out of Food in the Last Year: Not on file  Transportation Needs:   . Lack of Transportation (Medical): Not on file  . Lack of Transportation (Non-Medical): Not on file  Physical Activity:   . Days of Exercise per Week: Not on file  . Minutes of Exercise per  Session: Not on file  Stress:   . Feeling of Stress : Not on file  Social Connections:   . Frequency of Communication with Friends and Family: Not on file  . Frequency of Social Gatherings with Friends and Family: Not on file  . Attends Religious Services: Not on file  . Active Member of Clubs or Organizations: Not on file  . Attends Banker Meetings: Not on file  . Marital Status: Not on file  Intimate Partner Violence:   . Fear of Current or Ex-Partner: Not on file  . Emotionally Abused: Not on file  . Physically Abused: Not on file  . Sexually Abused: Not on file   Family History  Problem Relation Age of Onset  . Arthritis Mother   . Healthy Father   . Arthritis Maternal Grandmother   . Asthma Son   . Alcohol abuse Neg Hx    No current facility-administered medications on file prior to encounter.   Current Outpatient Medications on File Prior to Encounter   Medication Sig Dispense Refill  . metoCLOPramide (REGLAN) 10 MG tablet Take 1 tablet (10 mg total) by mouth every 6 (six) hours. 30 tablet 1  . ondansetron (ZOFRAN) 4 MG tablet Take 1 tablet (4 mg total) by mouth every 8 (eight) hours as needed for nausea or vomiting. 20 tablet 0  . promethazine (PHENADOZ) 25 MG suppository Place 1 suppository (25 mg total) rectally every 6 (six) hours as needed for nausea or vomiting. 12 each 0  . scopolamine (TRANSDERM-SCOP) 1 MG/3DAYS Place 1 patch (1.5 mg total) onto the skin every 3 (three) days. 10 patch 12  . acetaminophen (TYLENOL) 500 MG tablet Take 1 tablet (500 mg total) by mouth every 6 (six) hours as needed for mild pain or moderate pain. 30 tablet 0  . famotidine (PEPCID) 20 MG tablet Take 1 tablet (20 mg total) by mouth 2 (two) times daily. 30 tablet 0  . ferrous sulfate (FERROUSUL) 325 (65 FE) MG tablet Take 1 tablet (325 mg total) by mouth 2 (two) times daily. 60 tablet 1  . glycopyrrolate (ROBINUL) 1 MG tablet Take 1 tablet (1 mg total) by mouth 3 (three) times daily. 90 tablet 1  . Prenatal Vit-Fe Fumarate-FA (PRENATAL MULTIVITAMIN) TABS tablet Take 1 tablet by mouth daily at 12 noon.    . promethazine (PHENERGAN) 25 MG tablet Take 1 tablet (25 mg total) by mouth every 6 (six) hours as needed for nausea or vomiting. 30 tablet 0  . pyridOXINE (VITAMIN B-6) 25 MG tablet Take 1 tablet (25 mg total) by mouth every 8 (eight) hours. 30 tablet 0   No Known Allergies  I have reviewed patient's Past Medical Hx, Surgical Hx, Family Hx, Social Hx, medications and allergies.   Review of Systems  Constitutional: Negative.   Gastrointestinal: Positive for abdominal pain, nausea and vomiting. Negative for constipation and diarrhea.  Genitourinary: Negative.     OBJECTIVE Patient Vitals for the past 24 hrs:  BP Temp Temp src Pulse Resp SpO2 Height Weight  09/16/19 1913 -- -- -- -- -- -- 5\' 5"  (1.651 m) 68.4 kg  09/16/19 1756 132/84 98.6 F (37 C)  Oral 77 20 100 % -- --   Constitutional: Well-developed, well-nourished female in no acute distress.  Cardiovascular: normal rate & rhythm, no murmur Respiratory: normal rate and effort. Lung sounds clear throughout GI: Abd soft, non-tender, Pos BS x 4. No guarding or rebound tenderness MS: Extremities nontender, no edema, normal ROM Neurologic:  Alert and oriented x 4.    LAB RESULTS Results for orders placed or performed during the hospital encounter of 09/16/19 (from the past 24 hour(s))  Urinalysis, Routine w reflex microscopic     Status: Abnormal   Collection Time: 09/16/19  7:44 PM  Result Value Ref Range   Color, Urine YELLOW YELLOW   APPearance CLOUDY (A) CLEAR   Specific Gravity, Urine 1.024 1.005 - 1.030   pH 7.0 5.0 - 8.0   Glucose, UA NEGATIVE NEGATIVE mg/dL   Hgb urine dipstick NEGATIVE NEGATIVE   Bilirubin Urine NEGATIVE NEGATIVE   Ketones, ur 80 (A) NEGATIVE mg/dL   Protein, ur 604 (A) NEGATIVE mg/dL   Nitrite NEGATIVE NEGATIVE   Leukocytes,Ua NEGATIVE NEGATIVE   RBC / HPF 0-5 0 - 5 RBC/hpf   WBC, UA 0-5 0 - 5 WBC/hpf   Bacteria, UA RARE (A) NONE SEEN   Squamous Epithelial / LPF 0-5 0 - 5   Mucus PRESENT   Rapid urine drug screen (hospital performed)     Status: Abnormal   Collection Time: 09/16/19  7:44 PM  Result Value Ref Range   Opiates NONE DETECTED NONE DETECTED   Cocaine NONE DETECTED NONE DETECTED   Benzodiazepines NONE DETECTED NONE DETECTED   Amphetamines NONE DETECTED NONE DETECTED   Tetrahydrocannabinol POSITIVE (A) NONE DETECTED   Barbiturates NONE DETECTED NONE DETECTED    IMAGING No results found.  MAU COURSE Orders Placed This Encounter  Procedures  . Urinalysis, Routine w reflex microscopic  . Rapid urine drug screen (hospital performed)  . Daily weights   Meds ordered this encounter  Medications  . promethazine (PHENERGAN) injection 25 mg  . lactated ringers bolus 1,000 mL  . famotidine (PEPCID) IVPB 20 mg premix  . ondansetron  (ZOFRAN) injection 4 mg    MDM No OB complaints  Weight up from last MAU visit  IM phenergan ordered while waiting for patient to provide urine sample. Not observed vomiting while in MAU U/a with 80+ ketones & high gravity. IV fluids ordered with pepcid & zofran.   Care turned over to Wynelle Bourgeois CNM Judeth Horn, NP 09/16/2019  8:58 PM  FInished 2nd liter of fluids No observed vomiting though she says she has been.  Asleep when I checked on her Has voided Discussed we may not achieve a feeling of total well-being; She will likely retain some feeling of nausea Will discharge home with addition of Protonix for reflux I also added a dose of Decadron while here Has appt this week at [facility]  [facility] at [redacted]w[redacted]d .Nausea/vomiting in pregnancy - Plan: Discharge patient  Dehydration - Plan: Discharge patient  Epigastric pain - Plan: Discharge patient   Allergies as of 09/17/2019   No Known Allergies     Medication List    TAKE these medications   acetaminophen 500 MG tablet Commonly known as: TYLENOL Take 1 tablet (500 mg total) by mouth every 6 (six) hours as needed for mild pain or moderate pain.   famotidine 20 MG tablet Commonly known as: PEPCID Take 1 tablet (20 mg total) by mouth 2 (two) times daily.   ferrous sulfate 325 (65 FE) MG tablet Commonly known as: FerrouSul Take 1 tablet (325 mg total) by mouth 2 (two) times daily.   glycopyrrolate 1 MG tablet Commonly known as: Robinul Take 1 tablet (1 mg total) by mouth 3 (three) times daily.   metoCLOPramide 10 MG tablet Commonly known as: REGLAN Take 1 tablet (10 mg total) by  mouth every 6 (six) hours.   ondansetron 4 MG tablet Commonly known as: Zofran Take 1 tablet (4 mg total) by mouth every 8 (eight) hours as needed for nausea or vomiting.   pantoprazole 20 MG tablet Commonly known as: PROTONIX Take 1 tablet (20 mg total) by mouth daily.   prenatal multivitamin Tabs tablet Take 1 tablet by mouth daily  at 12 noon.   promethazine 25 MG tablet Commonly known as: PHENERGAN Take 1 tablet (25 mg total) by mouth every 6 (six) hours as needed for nausea or vomiting.   promethazine 25 MG suppository Commonly known as: Phenadoz Place 1 suppository (25 mg total) rectally every 6 (six) hours as needed for nausea or vomiting.   pyridOXINE 25 MG tablet Commonly known as: VITAMIN B-6 Take 1 tablet (25 mg total) by mouth every 8 (eight) hours.   scopolamine 1 MG/3DAYS Commonly known as: TRANSDERM-SCOP Place 1 patch (1.5 mg total) onto the skin every 3 (three) days.      Encouraged to return here or to other Urgent Care/ED if she develops worsening of symptoms, increase in pain, fever, or other concerning symptoms.    Aviva Signs, CNM

## 2019-09-16 NOTE — MAU Note (Signed)
Presents with c/o N&V, reports vomited approx 6 times in 24 hours.  States unable to keep anything down.

## 2019-09-17 DIAGNOSIS — O219 Vomiting of pregnancy, unspecified: Secondary | ICD-10-CM

## 2019-09-17 DIAGNOSIS — Z3A10 10 weeks gestation of pregnancy: Secondary | ICD-10-CM

## 2019-09-17 MED ORDER — PANTOPRAZOLE SODIUM 20 MG PO TBEC: 20.0000 mg | DELAYED_RELEASE_TABLET | Freq: Every day | ORAL | 2 refills | Status: DC

## 2019-09-17 NOTE — Discharge Instructions (Signed)
Dehydration, Adult Dehydration is a condition in which there is not enough water or other fluids in the body. This happens when a person loses more fluids than he or she takes in. Important organs, such as the kidneys, brain, and heart, cannot function without a proper amount of fluids. Any loss of fluids from the body can lead to dehydration. Dehydration can be mild, moderate, or severe. It should be treated right away to prevent it from becoming severe. What are the causes? Dehydration may be caused by:  Conditions that cause loss of water or other fluids, such as diarrhea, vomiting, or sweating or urinating a lot.  Not drinking enough fluids, especially when you are ill or doing activities that require a lot of energy.  Other illnesses and conditions, such as fever or infection.  Certain medicines, such as medicines that remove excess fluid from the body (diuretics).  Lack of safe drinking water.  Not being able to get enough water and food. What increases the risk? The following factors may make you more likely to develop this condition:  Having a long-term (chronic) illness that has not been treated properly, such as diabetes, heart disease, or kidney disease.  Being 20 years of age or older.  Having a disability.  Living in a place that is high in altitude, where thinner, drier air causes more fluid loss.  Doing exercises that put stress on your body for a long time (endurance sports). What are the signs or symptoms? Symptoms of dehydration depend on how severe it is. Mild or moderate dehydration  Thirst.  Dry lips or dry mouth.  Dizziness or light-headedness, especially when standing up from a seated position.  Muscle cramps.  Dark urine. Urine may be the color of tea.  Less urine or tears produced than usual.  Headache. Severe dehydration  Changes in skin. Your skin may be cold and clammy, blotchy, or pale. Your skin also may not return to normal after being  lightly pinched and released.  Little or no tears, urine, or sweat.  Changes in vital signs, such as rapid breathing and low blood pressure. Your pulse may be weak or may be faster than 100 beats a minute when you are sitting still.  Other changes, such as: ? Feeling very thirsty. ? Sunken eyes. ? Cold hands and feet. ? Confusion. ? Being very tired (lethargic) or having trouble waking from sleep. ? Short-term weight loss. ? Loss of consciousness. How is this diagnosed? This condition is diagnosed based on your symptoms and a physical exam. You may have blood and urine tests to help confirm the diagnosis. How is this treated? Treatment for this condition depends on how severe it is. Treatment should be started right away. Do not wait until dehydration becomes severe. Severe dehydration is an emergency and needs to be treated in a hospital.  Mild or moderate dehydration can be treated at home. You may be asked to: ? Drink more fluids. ? Drink an oral rehydration solution (ORS). This drink helps restore proper amounts of fluids and salts and minerals in the blood (electrolytes).  Severe dehydration can be treated: ? With IV fluids. ? By correcting abnormal levels of electrolytes. This is often done by giving electrolytes through a tube that is passed through your nose and into your stomach (nasogastric tube, or NG tube). ? By treating the underlying cause of dehydration. Follow these instructions at home: Oral rehydration solution If told by your health care provider, drink an ORS:  Make  an ORS by following instructions on the package.  Start by drinking small amounts, about  cup (120 mL) every 5-10 minutes.  Slowly increase how much you drink until you have taken the amount recommended by your health care provider. Eating and drinking         Drink enough clear fluid to keep your urine pale yellow. If you were told to drink an ORS, finish the ORS first and then start slowly  drinking other clear fluids. Drink fluids such as: ? Water. Do not drink only water. Doing that can lead to hyponatremia, which is having too little salt (sodium) in the body. ? Water from ice chips you suck on. ? Fruit juice that you have added water to (diluted fruit juice). ? Low-calorie sports drinks.  Eat foods that contain a healthy balance of electrolytes, such as bananas, oranges, potatoes, tomatoes, and spinach.  Do not drink alcohol.  Avoid the following: ? Drinks that contain a lot of sugar. These include high-calorie sports drinks, fruit juice that is not diluted, and soda. ? Caffeine. ? Foods that are greasy or contain a lot of fat or sugar. General instructions  Take over-the-counter and prescription medicines only as told by your health care provider.  Do not take sodium tablets. Doing that can lead to having too much sodium in the body (hypernatremia).  Return to your normal activities as told by your health care provider. Ask your health care provider what activities are safe for you.  Keep all follow-up visits as told by your health care provider. This is important. Contact a health care provider if:  You have muscle cramps, pain, or discomfort, such as: ? Pain in your abdomen and the pain gets worse or stays in one area (localizes). ? Stiff neck.  You have a rash.  You are more irritable than usual.  You are sleepier or have a harder time waking than usual.  You feel weak or dizzy.  You feel very thirsty. Get help right away if you have:  Any symptoms of severe dehydration.  Symptoms of vomiting, such as: ? You cannot eat or drink without vomiting. ? Vomiting gets worse or does not go away. ? Vomit includes blood or green matter (bile).  Symptoms that get worse with treatment.  A fever.  A severe headache.  Problems with urination or bowel movements, such as: ? Diarrhea that gets worse or does not go away. ? Blood in your stool (feces). This  may cause stool to look black and tarry. ? Not urinating, or urinating only a small amount of very dark urine, within 6-8 hours.  Trouble breathing. These symptoms may represent a serious problem that is an emergency. Do not wait to see if the symptoms will go away. Get medical help right away. Call your local emergency services (911 in the [place] Do not drive yourself to the hospital. Summary  Dehydration is a condition in which there is not enough water or other fluids in the body. This happens when a person loses more fluids than he or she takes in.  Treatment for this condition depends on how severe it is. Treatment should be started right away. Do not wait until dehydration becomes severe.  Drink enough clear fluid to keep your urine pale yellow. If you were told to drink an oral rehydration solution (ORS), finish the ORS first and then start slowly drinking other clear fluids.  Take over-the-counter and prescription medicines only as told by your health care  provider.  Get help right away if you have any symptoms of severe dehydration. This information is not intended to replace advice given to you by your health care provider. Make sure you discuss any questions you have with your health care provider. Document Revised: 03/19/2019 Document Reviewed: 03/19/2019 Elsevier Patient Education  2020 [employer]  Hyperemesis Gravidarum Hyperemesis gravidarum is a severe form of nausea and vomiting that happens during pregnancy. Hyperemesis is worse than morning sickness. It may cause you to have nausea or vomiting all day for many days. It may keep you from eating and drinking enough food and liquids, which can lead to dehydration, malnutrition, and weight loss. Hyperemesis usually occurs during the first half (the first 20 weeks) of pregnancy. It often goes away once a woman is in her second half of pregnancy. However, sometimes hyperemesis continues through an entire pregnancy. What are the  causes? The cause of this condition is not known. It may be related to changes in chemicals (hormones) in the body during pregnancy, such as the high level of pregnancy hormone (human chorionic gonadotropin) or the increase in the female sex hormone (estrogen). What are the signs or symptoms? Symptoms of this condition include:  Nausea that does not go away.  Vomiting that does not allow you to keep any food down.  Weight loss.  Body fluid loss (dehydration).  Having no desire to eat, or not liking food that you have previously enjoyed. How is this diagnosed? This condition may be diagnosed based on:  A physical exam.  Your medical history.  Your symptoms.  Blood tests.  Urine tests. How is this treated? This condition is managed by controlling symptoms. This may include:  Following an eating plan. This can help lessen nausea and vomiting.  Taking prescription medicines. An eating plan and medicines are often used together to help control symptoms. If medicines do not help relieve nausea and vomiting, you may need to receive fluids through an IV at the hospital. Follow these instructions at home: Eating and drinking   Avoid the following: ? Drinking fluids with meals. Try not to drink anything during the 30 minutes before and after your meals. ? Drinking more than 1 cup of fluid at a time. ? Eating foods that trigger your symptoms. These may include spicy foods, coffee, high-fat foods, very sweet foods, and acidic foods. ? Skipping meals. Nausea can be more intense on an empty stomach. If you cannot tolerate food, do not force it. Try sucking on ice chips or other frozen items and make up for missed calories later. ? Lying down within 2 hours after eating. ? Being exposed to environmental triggers. These may include food smells, smoky rooms, closed spaces, rooms with strong smells, warm or humid places, overly loud and noisy rooms, and rooms with motion or flickering  lights. Try eating meals in a well-ventilated area that is free of strong smells. ? Quick and sudden changes in your movement. ? Taking iron pills and multivitamins that contain iron. If you take prescription iron pills, do not stop taking them unless your health care provider approves. ? Preparing food. The smell of food can spoil your appetite or trigger nausea.  To help relieve your symptoms: ? Listen to your body. Everyone is different and has different preferences. Find what works best for you. ? Eat and drink slowly. ? Eat 5-6 small meals daily instead of 3 large meals. Eating small meals and snacks can help you avoid an empty stomach. ?  In the morning, before getting out of bed, eat a couple of crackers to avoid moving around on an empty stomach. ? Try eating starchy foods as these are usually tolerated well. Examples include cereal, toast, bread, potatoes, pasta, rice, and pretzels. ? Include at least 1 serving of protein with your meals and snacks. Protein options include lean meats, poultry, seafood, beans, nuts, nut butters, eggs, cheese, and yogurt. ? Try eating a protein-rich snack before bed. Examples of a protein-rick snack include cheese and crackers or a peanut butter sandwich made with 1 slice of whole-wheat bread and 1 tsp (5 g) of peanut butter. ? Eat or suck on things that have ginger in them. It may help relieve nausea. Add  tsp ground ginger to hot tea or choose ginger tea. ? Try drinking 100% fruit juice or an electrolyte drink. An electrolyte drink contains sodium, potassium, and chloride. ? Drink fluids that are cold, clear, and carbonated or sour. Examples include lemonade, ginger ale, lemon-lime soda, ice water, and sparkling water. ? Brush your teeth or use a mouth rinse after meals. ? Talk with your health care provider about starting a supplement of vitamin B6. General instructions  Take over-the-counter and prescription medicines only as told by your health care  provider.  Follow instructions from your health care provider about eating or drinking restrictions.  Continue to take your prenatal vitamins as told by your health care provider. If you are having trouble taking your prenatal vitamins, talk with your health care provider about different options.  Keep all follow-up and pre-birth (prenatal) visits as told by your health care provider. This is important. Contact a health care provider if:  You have pain in your abdomen.  You have a severe headache.  You have vision problems.  You are losing weight.  You feel weak or dizzy. Get help right away if:  You cannot drink fluids without vomiting.  You vomit blood.  You have constant nausea and vomiting.  You are very weak.  You faint.  You have a fever and your symptoms suddenly get worse. Summary  Hyperemesis gravidarum is a severe form of nausea and vomiting that happens during pregnancy.  Making some changes to your eating habits may help relieve nausea and vomiting.  This condition may be managed with medicine.  If medicines do not help relieve nausea and vomiting, you may need to receive fluids through an IV at the hospital. This information is not intended to replace advice given to you by your health care provider. Make sure you discuss any questions you have with your health care provider. Document Revised: 08/26/2017 Document Reviewed: 04/04/2016 Elsevier Patient Education  [place].

## 2019-09-18 NOTE — Progress Notes (Signed)
Patient ID: Melanie Hancock, female   DOB: Nov 09, 1995, 24 y.o.   MRN: [id number] Patient seen and assessed by nursing staff during this encounter. I have reviewed the chart and agree with the documentation and plan.  Scheryl Darter, MD 09/18/2019 2:46 PM

## 2019-09-20 ENCOUNTER — Inpatient Hospital Stay (HOSPITAL_COMMUNITY)
Admission: AD | Admit: 2019-09-20 | Discharge: 2019-09-20 | Disposition: A | Discharge status: Home / Self Care | Payer: Medicaid Other | Attending: Obstetrics & Gynecology | Admitting: Obstetrics & Gynecology

## 2019-09-20 ENCOUNTER — Encounter (HOSPITAL_COMMUNITY): Payer: Self-pay | Admitting: Obstetrics & Gynecology

## 2019-09-20 ENCOUNTER — Other Ambulatory Visit: Payer: Self-pay

## 2019-09-20 DIAGNOSIS — R112 Nausea with vomiting, unspecified: Secondary | ICD-10-CM | POA: Diagnosis not present

## 2019-09-20 DIAGNOSIS — F12188 Cannabis abuse with other cannabis-induced disorder: Secondary | ICD-10-CM | POA: Insufficient documentation

## 2019-09-20 DIAGNOSIS — O99322 Drug use complicating pregnancy, second trimester: Secondary | ICD-10-CM | POA: Insufficient documentation

## 2019-09-20 DIAGNOSIS — Z3A10 10 weeks gestation of pregnancy: Secondary | ICD-10-CM | POA: Diagnosis not present

## 2019-09-20 DIAGNOSIS — Z87891 Personal history of nicotine dependence: Secondary | ICD-10-CM | POA: Diagnosis not present

## 2019-09-20 DIAGNOSIS — O26891 Other specified pregnancy related conditions, first trimester: Secondary | ICD-10-CM

## 2019-09-20 DIAGNOSIS — F1290 Cannabis use, unspecified, uncomplicated: Secondary | ICD-10-CM

## 2019-09-20 DIAGNOSIS — O210 Mild hyperemesis gravidarum: Secondary | ICD-10-CM | POA: Diagnosis present

## 2019-09-20 LAB — URINALYSIS, ROUTINE W REFLEX MICROSCOPIC
Bilirubin Urine: NEGATIVE
Glucose, UA: NEGATIVE mg/dL
Hgb urine dipstick: NEGATIVE
Ketones, ur: 80 mg/dL — AB
Leukocytes,Ua: NEGATIVE
Nitrite: NEGATIVE
Protein, ur: 30 mg/dL — AB
Specific Gravity, Urine: 1.031 — ABNORMAL HIGH (ref 1.005–1.030)
pH: 6 (ref 5.0–8.0)

## 2019-09-20 MED ORDER — M.V.I. ADULT IV INJ
Freq: Once | INTRAVENOUS | Status: AC
  Filled 2019-09-20: qty 10

## 2019-09-20 MED ORDER — METOCLOPRAMIDE HCL 5 MG/ML IJ SOLN
5.0000 mg | Freq: Once | INTRAMUSCULAR | Status: AC
  Administered 2019-09-20: 5 mg via INTRAVENOUS
  Filled 2019-09-20: qty 2

## 2019-09-20 MED ORDER — LACTATED RINGERS IV BOLUS
1000.0000 mL | Freq: Once | INTRAVENOUS | Status: AC
  Administered 2019-09-20: 1000 mL via INTRAVENOUS

## 2019-09-20 MED ORDER — PANTOPRAZOLE SODIUM 40 MG IV SOLR
40.0000 mg | Freq: Once | INTRAVENOUS | Status: AC
  Administered 2019-09-20: 40 mg via INTRAVENOUS
  Filled 2019-09-20: qty 40

## 2019-09-20 MED ORDER — HYDROCORT-PRAMOXINE (PERIANAL) 1-1 % EX FOAM: 1.0000 | Freq: Two times a day (BID) | CUTANEOUS | 1 refills | Status: DC

## 2019-09-20 NOTE — MAU Provider Note (Signed)
History     CSN: [id number]  Arrival date and time: 09/20/19 0140   First Provider Initiated Contact with Patient 09/20/19 0232      Chief Complaint  Patient presents with  . Nausea   Melanie Hancock is a 24 y.o. [id number] at [redacted]w[redacted]d who receives care at [facility].  She presents today for Nausea.  Patient reports she has had vomiting about 5x today despite zofran and phenergan dosing.  She reports taking phenergan suppository around 8pm today, but has not had anything since. She states she has tried eating some grapes and crackers, but was unable to keep them down.  She reports she is able to keep water down, but not all the time.  She states that she has on a scopalamine patch and just replaced the patch today. Patient taking prilosec, but did not take it today.  Patient reports she took Zofran earlier today.       OB History    Gravida  5   Para  3   Term  3   Preterm      AB  1   Living  3     SAB  0   TAB  1   Ectopic      Multiple  0   Live Births  3           Past Medical History:  Diagnosis Date  . Anemia   . Anxiety   . Blood transfusion without reported diagnosis   . Chronic back pain   . Depression   . GERD (gastroesophageal reflux disease)   . Infection    UTI  . Post partum depression   . Pyelonephritis   . UTI (lower urinary tract infection)     Past Surgical History:  Procedure Laterality Date  . DILATION AND CURETTAGE OF UTERUS    . INDUCED ABORTION    . WISDOM TOOTH EXTRACTION      Family History  Problem Relation Age of Onset  . Arthritis Mother   . Healthy Father   . Arthritis Maternal Grandmother   . Asthma Son   . Alcohol abuse Neg Hx     Social History   Tobacco Use  . Smoking status: Former Smoker    Quit date: 01/21/2014    Years since quitting: 5.6  . Smokeless tobacco: Never Used  Substance Use Topics  . Alcohol use: Yes    Comment: rarely  . Drug use: Not Currently    Types: Marijuana    Comment: 09/15/2019     Allergies: No Known Allergies  Medications Prior to Admission  Medication Sig Dispense Refill Last Dose  . omeprazole (PRILOSEC) 20 MG capsule Take 20 mg by mouth daily.   09/19/2019 at Unknown time  . ondansetron (ZOFRAN) 4 MG tablet Take 1 tablet (4 mg total) by mouth every 8 (eight) hours as needed for nausea or vomiting. 20 tablet 0 09/19/2019 at Unknown time  . promethazine (PHENADOZ) 25 MG suppository Place 1 suppository (25 mg total) rectally every 6 (six) hours as needed for nausea or vomiting. 12 each 0 09/19/2019 at 700pm  . acetaminophen (TYLENOL) 500 MG tablet Take 1 tablet (500 mg total) by mouth every 6 (six) hours as needed for mild pain or moderate pain. 30 tablet 0   . famotidine (PEPCID) 20 MG tablet Take 1 tablet (20 mg total) by mouth 2 (two) times daily. 30 tablet 0   . ferrous sulfate (FERROUSUL) 325 (65 FE) MG tablet Take  1 tablet (325 mg total) by mouth 2 (two) times daily. 60 tablet 1   . glycopyrrolate (ROBINUL) 1 MG tablet Take 1 tablet (1 mg total) by mouth 3 (three) times daily. 90 tablet 1   . metoCLOPramide (REGLAN) 10 MG tablet Take 1 tablet (10 mg total) by mouth every 6 (six) hours. 30 tablet 1   . pantoprazole (PROTONIX) 20 MG tablet Take 1 tablet (20 mg total) by mouth daily. 30 tablet 2   . Prenatal Vit-Fe Fumarate-FA (PRENATAL MULTIVITAMIN) TABS tablet Take 1 tablet by mouth daily at 12 noon.     . promethazine (PHENERGAN) 25 MG tablet Take 1 tablet (25 mg total) by mouth every 6 (six) hours as needed for nausea or vomiting. 30 tablet 0   . pyridOXINE (VITAMIN B-6) 25 MG tablet Take 1 tablet (25 mg total) by mouth every 8 (eight) hours. 30 tablet 0   . scopolamine (TRANSDERM-SCOP) 1 MG/3DAYS Place 1 patch (1.5 mg total) onto the skin every 3 (three) days. 10 patch 12     Review of Systems  Gastrointestinal: Positive for nausea and vomiting.   Physical Exam   Blood pressure (!) 114/58, pulse 63, temperature 98.1 F (36.7 C), temperature source Oral,  last menstrual period 07/06/2019.  Physical Exam  Constitutional: She is oriented to person, place, and time. She appears well-developed and well-nourished.  HENT:  Head: Normocephalic and atraumatic.  Eyes: Conjunctivae are normal.  Cardiovascular: Normal rate, regular rhythm and normal heart sounds.  Respiratory: Effort normal and breath sounds normal.  GI: Soft. Bowel sounds are normal.  Musculoskeletal:        General: Normal range of motion.     Cervical back: Normal range of motion.  Neurological: She is alert and oriented to person, place, and time.  Skin: Skin is warm and dry.  Psychiatric: She has a normal mood and affect. Her behavior is normal.    MAU Course  Procedures Results for orders placed or performed during the hospital encounter of 09/20/19 (from the past 24 hour(s))  Urinalysis, Routine w reflex microscopic     Status: Abnormal   Collection Time: 09/20/19  2:06 AM  Result Value Ref Range   Color, Urine YELLOW YELLOW   APPearance HAZY (A) CLEAR   Specific Gravity, Urine 1.031 (H) 1.005 - 1.030   pH 6.0 5.0 - 8.0   Glucose, UA NEGATIVE NEGATIVE mg/dL   Hgb urine dipstick NEGATIVE NEGATIVE   Bilirubin Urine NEGATIVE NEGATIVE   Ketones, ur 80 (A) NEGATIVE mg/dL   Protein, ur 30 (A) NEGATIVE mg/dL   Nitrite NEGATIVE NEGATIVE   Leukocytes,Ua NEGATIVE NEGATIVE   RBC / HPF 6-10 0 - 5 RBC/hpf   WBC, UA 0-5 0 - 5 WBC/hpf   Bacteria, UA RARE (A) NONE SEEN   Squamous Epithelial / LPF 0-5 0 - 5   Mucus PRESENT    Hyaline Casts, UA PRESENT     MDM Start IV LR Bolus f/b Banana Bag Antiemetic Assessment and Plan  24 year old,  [id number] at 10.6 weeks Cannabinoid Hyperemesis  -Reviewed POC with patient. -Exam performed and findings discussed.  -Patient educated on hyperemesis cannabinoid and informed that symptoms will reside once marijuana no longer detected in system. -UDS from 1/27 positive for THC. -Patient informed of results and today's UA with  ketones. -Will start IV and give fluids. -Reglan and Protonix. -Will await for completion of fluids and then reassess.   Cherre Robins 09/20/2019, 2:32 AM   Reassessment (  5:44 AM)  -Patient able to tolerate crackers. -Reports hemorrhoids to nurse who assess and confirms small hemorrhoid with no redness. -Rx for proctofoam sent to pharmacy on file. -Encouraged to call or return to MAU if symptoms worsen or with the onset of new symptoms. -Discharged to home in stable condition.  Cherre Robins MSN, CNM Advanced Practice Provider, [facility]

## 2019-09-20 NOTE — MAU Note (Addendum)
Pt reports to MAU for nausea and vomiting. Pt states she has not been able to eat or keep anything down, and that she slept for two days because she is weak.  Pt states that she passed out after throwing up in her home earlier, but that she did not fall she was squatting on the floor. Pt also mentioned she has a hemorrhoid and that her back hurts (+CVA tenderness). Denies VB and LOF.

## 2019-09-20 NOTE — Discharge Instructions (Signed)
Cannabinoid Hyperemesis Syndrome Cannabinoid hyperemesis syndrome (CHS) is a condition that causes repeated nausea, vomiting, and abdominal pain after long-term (chronic) use of marijuana (cannabis). People with CHS typically use marijuana 3-5 times a day for many years before they have symptoms, although it is possible to develop CHS with as little as 1 use per day. Symptoms of CHS may be mild at first but can get worse and more frequent. In some cases, CHS may cause vomiting many times a day, which can lead to weight loss and dehydration. CHS may go away and come back many times (recur). People may not have symptoms or may otherwise be healthy in between CHS attacks. What are the causes? The exact cause of this condition is not known. Long-term use of marijuana may over-stimulate certain proteins in the brain that react with chemicals in marijuana (cannabinoid receptors). This over-stimulation may cause CHS. What are the signs or symptoms? Symptoms of this condition are often mild during the first few attacks, but they can get worse over time. Symptoms may include:  Frequent nausea, especially early in the morning.  Vomiting.  Abdominal pain. Taking several hot showers throughout the day can also be a sign of this condition. People with CHS may do this because it relieves symptoms. How is this diagnosed? This condition may be diagnosed based on:  Your symptoms and medical history, including any drug use.  A physical exam. You may have tests done to rule out other problems. These tests may include:  Blood tests.  Urine tests.  Imaging tests, such as an X-ray or CT scan. How is this treated? Treatment for this condition involves stopping marijuana use. Your health care provider may recommend:  A drug rehabilitation program, if you have trouble stopping marijuana use.  Medicines for nausea.  Hot showers to help relieve symptoms. Certain creams that contain a substance called  capsaicin may improve symptoms when applied to the abdomen. Ask your health care provider before starting any medicines or other treatments. Severe nausea and vomiting may require you to stay at the hospital. You may need IV fluids to prevent or treat dehydration. You may also need certain medicines that must be given at the hospital. Follow these instructions at home: During an attack   Stay in bed and rest in a dark, quiet room.  Take anti-nausea medicine as told by your health care provider.  Try taking hot showers to relieve your symptoms. After an attack  Drink small amounts of clear fluids slowly. Gradually add more.  Once you are able to eat without vomiting, eat soft foods in small amounts every 3-4 hours. General instructions   Do not use any products that contain marijuana.If you need help quitting, ask your health care provider for resources and treatment options.  Drink enough fluid to keep your urine pale yellow. Avoid drinking fluids that have a lot of sugar or caffeine, such as coffee and soda.  Take and apply over-the-counter and prescription medicines only as told by your health care provider. Ask your health care provider before starting any new medicines or treatments.  Keep all follow-up visits as told by your health care provider. This is important. Contact a health care provider if:  Your symptoms get worse.  You cannot drink fluids without vomiting.  You have pain and trouble swallowing after an attack. Get help right away if:  You cannot stop vomiting.  You have blood in your vomit or your vomit looks like coffee grounds.  You have   severe abdominal pain.  You have stools that are bloody or black, or stools that look like tar.  You have symptoms of dehydration, such as: ? Sunken eyes. ? Inability to make tears. ? Cracked lips. ? Dry mouth. ? Decreased urine production. ? Weakness. ? Sleepiness. ? Fainting. Summary  Cannabinoid hyperemesis  syndrome (CHS) is a condition that causes repeated nausea, vomiting, and abdominal pain after long-term use of marijuana.  People with CHS typically use marijuana 3-5 times a day for many years before they have symptoms, although it is possible to develop CHS with as little as 1 use per day.  Treatment for this condition involves stopping marijuana use. Hot showers and capsaicin creams may also help relieve symptoms. Ask your health care provider before starting any medicines or other treatments.  Your health care provider may prescribe medicines to help with nausea.  Get help right away if you have signs of dehydration, such as dry mouth, decreased urine production, or weakness. This information is not intended to replace advice given to you by your health care provider. Make sure you discuss any questions you have with your health care provider. Document Revised: 12/13/2017 Document Reviewed: 11/14/2016 Elsevier Patient Education  2020 Elsevier Inc.  

## 2019-09-30 DIAGNOSIS — D649 Anemia, unspecified: Secondary | ICD-10-CM | POA: Insufficient documentation

## 2019-10-05 ENCOUNTER — Other Ambulatory Visit: Payer: Self-pay

## 2019-10-05 ENCOUNTER — Encounter (HOSPITAL_COMMUNITY): Payer: Self-pay | Admitting: Obstetrics and Gynecology

## 2019-10-05 ENCOUNTER — Inpatient Hospital Stay (HOSPITAL_COMMUNITY)
Admission: AD | Admit: 2019-10-05 | Discharge: 2019-10-05 | Disposition: A | Discharge status: Home / Self Care | Payer: Medicaid Other | Attending: Obstetrics and Gynecology | Admitting: Obstetrics and Gynecology

## 2019-10-05 DIAGNOSIS — Z3A13 13 weeks gestation of pregnancy: Secondary | ICD-10-CM | POA: Diagnosis not present

## 2019-10-05 DIAGNOSIS — O99891 Other specified diseases and conditions complicating pregnancy: Secondary | ICD-10-CM | POA: Insufficient documentation

## 2019-10-05 DIAGNOSIS — Z8759 Personal history of other complications of pregnancy, childbirth and the puerperium: Secondary | ICD-10-CM | POA: Diagnosis not present

## 2019-10-05 DIAGNOSIS — O99321 Drug use complicating pregnancy, first trimester: Secondary | ICD-10-CM | POA: Insufficient documentation

## 2019-10-05 DIAGNOSIS — O219 Vomiting of pregnancy, unspecified: Secondary | ICD-10-CM

## 2019-10-05 DIAGNOSIS — O99011 Anemia complicating pregnancy, first trimester: Secondary | ICD-10-CM | POA: Diagnosis not present

## 2019-10-05 DIAGNOSIS — D649 Anemia, unspecified: Secondary | ICD-10-CM | POA: Diagnosis not present

## 2019-10-05 DIAGNOSIS — M549 Dorsalgia, unspecified: Secondary | ICD-10-CM | POA: Diagnosis not present

## 2019-10-05 DIAGNOSIS — G8929 Other chronic pain: Secondary | ICD-10-CM | POA: Insufficient documentation

## 2019-10-05 DIAGNOSIS — O99611 Diseases of the digestive system complicating pregnancy, first trimester: Secondary | ICD-10-CM | POA: Insufficient documentation

## 2019-10-05 DIAGNOSIS — Z79899 Other long term (current) drug therapy: Secondary | ICD-10-CM | POA: Diagnosis not present

## 2019-10-05 DIAGNOSIS — F1290 Cannabis use, unspecified, uncomplicated: Secondary | ICD-10-CM | POA: Insufficient documentation

## 2019-10-05 DIAGNOSIS — Z87891 Personal history of nicotine dependence: Secondary | ICD-10-CM | POA: Insufficient documentation

## 2019-10-05 DIAGNOSIS — R112 Nausea with vomiting, unspecified: Secondary | ICD-10-CM

## 2019-10-05 DIAGNOSIS — K219 Gastro-esophageal reflux disease without esophagitis: Secondary | ICD-10-CM | POA: Insufficient documentation

## 2019-10-05 LAB — URINALYSIS, ROUTINE W REFLEX MICROSCOPIC
Bilirubin Urine: NEGATIVE
Glucose, UA: NEGATIVE mg/dL
Hgb urine dipstick: NEGATIVE
Ketones, ur: NEGATIVE mg/dL
Leukocytes,Ua: NEGATIVE
Nitrite: NEGATIVE
Protein, ur: NEGATIVE mg/dL
Specific Gravity, Urine: 1.016 (ref 1.005–1.030)
pH: 9 — ABNORMAL HIGH (ref 5.0–8.0)

## 2019-10-05 LAB — CBC WITH DIFFERENTIAL/PLATELET
Abs Immature Granulocytes: 0.01 10*3/uL (ref 0.00–0.07)
Basophils Absolute: 0 10*3/uL (ref 0.0–0.1)
Basophils Relative: 1 %
Eosinophils Absolute: 0.2 10*3/uL (ref 0.0–0.5)
Eosinophils Relative: 2 %
HCT: 35.2 % — ABNORMAL LOW (ref 36.0–46.0)
Hemoglobin: 11.3 g/dL — ABNORMAL LOW (ref 12.0–15.0)
Immature Granulocytes: 0 %
Lymphocytes Relative: 13 %
Lymphs Abs: 1 10*3/uL (ref 0.7–4.0)
MCH: 26.2 pg (ref 26.0–34.0)
MCHC: 32.1 g/dL (ref 30.0–36.0)
MCV: 81.7 fL (ref 80.0–100.0)
Monocytes Absolute: 0.4 10*3/uL (ref 0.1–1.0)
Monocytes Relative: 6 %
Neutro Abs: 6.2 10*3/uL (ref 1.7–7.7)
Neutrophils Relative %: 78 %
Platelets: 363 10*3/uL (ref 150–400)
RBC: 4.31 MIL/uL (ref 3.87–5.11)
RDW: 19.2 % — ABNORMAL HIGH (ref 11.5–15.5)
WBC: 7.8 10*3/uL (ref 4.0–10.5)
nRBC: 0 % (ref 0.0–0.2)

## 2019-10-05 LAB — RAPID URINE DRUG SCREEN, HOSP PERFORMED
Amphetamines: NOT DETECTED
Barbiturates: NOT DETECTED
Benzodiazepines: NOT DETECTED
Cocaine: NOT DETECTED
Opiates: NOT DETECTED
Tetrahydrocannabinol: POSITIVE — AB

## 2019-10-05 LAB — COMPREHENSIVE METABOLIC PANEL
ALT: 17 U/L (ref 0–44)
AST: 18 U/L (ref 15–41)
Albumin: 3.7 g/dL (ref 3.5–5.0)
Alkaline Phosphatase: 40 U/L (ref 38–126)
Anion gap: 11 (ref 5–15)
BUN: 6 mg/dL (ref 6–20)
CO2: 21 mmol/L — ABNORMAL LOW (ref 22–32)
Calcium: 9.3 mg/dL (ref 8.9–10.3)
Chloride: 104 mmol/L (ref 98–111)
Creatinine, Ser: 0.57 mg/dL (ref 0.44–1.00)
GFR calc Af Amer: >60 mL/min (ref >60)
GFR calc non Af Amer: >60 mL/min (ref >60)
Glucose, Bld: 100 mg/dL — ABNORMAL HIGH (ref 70–99)
Potassium: 3.8 mmol/L (ref 3.5–5.1)
Sodium: 136 mmol/L (ref 135–145)
Total Bilirubin: 0.6 mg/dL (ref 0.3–1.2)
Total Protein: 7.3 g/dL (ref 6.5–8.1)

## 2019-10-05 MED ORDER — LACTATED RINGERS IV BOLUS
1000.0000 mL | Freq: Once | INTRAVENOUS | Status: AC
  Administered 2019-10-05: 14:00:00 1000 mL via INTRAVENOUS

## 2019-10-05 MED ORDER — LACTATED RINGERS IV BOLUS
1000.0000 mL | Freq: Once | INTRAVENOUS | Status: AC
  Administered 2019-10-05: 16:00:00 1000 mL via INTRAVENOUS

## 2019-10-05 MED ORDER — SCOPOLAMINE 1 MG/3DAYS TD PT72: 1.0000 | MEDICATED_PATCH | TRANSDERMAL | 1 refills | Status: DC

## 2019-10-05 MED ORDER — PANTOPRAZOLE SODIUM 40 MG IV SOLR
40.0000 mg | Freq: Once | INTRAVENOUS | Status: AC
  Administered 2019-10-05: 14:00:00 40 mg via INTRAVENOUS
  Filled 2019-10-05: qty 40

## 2019-10-05 MED ORDER — METOCLOPRAMIDE HCL 5 MG/ML IJ SOLN
10.0000 mg | Freq: Once | INTRAMUSCULAR | Status: AC
  Administered 2019-10-05: 15:00:00 10 mg via INTRAVENOUS
  Filled 2019-10-05: qty 2

## 2019-10-05 MED ORDER — SCOPOLAMINE 1 MG/3DAYS TD PT72
1.0000 | MEDICATED_PATCH | TRANSDERMAL | Status: DC
  Administered 2019-10-05: 14:00:00 1.5 mg via TRANSDERMAL
  Filled 2019-10-05: qty 1

## 2019-10-05 MED ORDER — SODIUM CHLORIDE 0.9 % IV SOLN
8.0000 mg | Freq: Once | INTRAVENOUS | Status: AC
  Administered 2019-10-05: 14:00:00 8 mg via INTRAVENOUS
  Filled 2019-10-05: qty 4

## 2019-10-05 MED ORDER — DEXAMETHASONE SODIUM PHOSPHATE 10 MG/ML IJ SOLN
10.0000 mg | Freq: Once | INTRAMUSCULAR | Status: AC
  Administered 2019-10-05: 15:00:00 10 mg via INTRAVENOUS
  Filled 2019-10-05: qty 1

## 2019-10-05 NOTE — Discharge Instructions (Signed)
Marijuana Use During Pregnancy and Breastfeeding  Marijuana is the dried leaves, flowers, and stems of the Cannabis sativa or Cannabis indica plant. The plant's active ingredients (cannabinoids), including a chemical called THC, change the chemistry of the brain. Marijuana smoke also has many of the same chemicals as cigarette smoke that cause breathing problems. Marijuana gets into your blood through your lungs when you smoke it and through your digestive system when you swallow it. Using marijuana in any form may be harmful for you and your baby when you are trying to become pregnant and during pregnancy. This includes marijuana that is prescribed to you by a health care provider (medical marijuana). Once marijuana is in your blood, it can travel through your placenta to your baby. It may also pass through breast milk. How does this affect me? Marijuana affects you both mentally and physically. Using marijuana can make you feel high and relaxed. It can also have negative effects, especially at high doses or with long-term use. These include:  Rapid heartbeat and stress on your heart.  Lung irritation and breathing problems.  Difficulty thinking and making decisions.  Seeing or believing things that are not true (hallucinations and paranoia).  Mood swings, depression, or anxiety.  Decreased ability to learn and remember.  Difficulty getting pregnant. Marijuana can also affect your pregnancy. Not all the effects are known. However, if you use marijuana during pregnancy, you may:  Be less likely to get regular prenatal care and do the things that you need to do to have a healthy pregnancy.  Be more likely to use other drugs that can harm your pregnancy, like drinking alcohol and smoking cigarettes.  Be at higher risk of having your baby die after 28 weeks of pregnancy (stillbirth).  Be at higher risk of giving birth before 37 weeks of pregnancy (premature birth). How does this affect my  baby? If you use marijuana during pregnancy, this may affect your baby's development, birth, and life after birth. Your baby may:  Be born prematurely, which can cause physical and mental problems.  Be born with a low birth weight, which can lead to physical and mental problems.  Have problems with brain development.  Have difficulty growing.  Have attention and behavior problems later in life.  Do poorly at school and have learning problems later in life.  Have problems with vision and coordination.  Be at higher risk for using marijuana by age 38. More research is needed to find out exactly how marijuana affects a baby during breastfeeding. Some studies suggest that the chemicals in marijuana can be passed to a baby through breast milk. To limit possible risks, you should not use marijuana during breastfeeding. Follow these instructions at home:  Let your health care provider know if you use marijuana before trying to get pregnant, during pregnancy, or during breastfeeding.  Do not use marijuana in any form when you are trying to get pregnant, when you are pregnant, or when you are breastfeeding. If you are having trouble stopping marijuana use, ask your health care provider for help.  Do not smoke. If you need help quitting, ask your health care provider for help.  If you are using medical marijuana, ask your health care provider to switch you to a medicine that is safer to use during pregnancy or breastfeeding.  Keep all your prenatal visits as told by your health care provider. This is important. Where to find more information [employer] on Drug Abuse: www.drugabuse.gov March of Dimes:  www.marchofdimes.org/pregnancy Contact a health care provider if:  You use marijuana and want to get pregnant.  You use marijuana during pregnancy or breastfeeding.  You need help stopping marijuana use. Get help right away if:  Your baby is not gaining weight or growing as  expected. Summary  Using marijuana in any form may be harmful for you and your baby when you are trying to become pregnant, during pregnancy, and during breastfeeding. This includes marijuana that is prescribed to you (medical marijuana).  Some studies suggest that marijuana may pass through breast milk and can affect your baby's brain development.  Talk to your health care provider if you use marijuana in any form while trying to get pregnant, during pregnancy, or while breastfeeding.  Ask your health care provider for help if you are not able to stop using marijuana. This information is not intended to replace advice given to you by your health care provider. Make sure you discuss any questions you have with your health care provider. Document Revised: 11/28/2018 Document Reviewed: 04/24/2017 Elsevier Patient Education  2020 Elsevier Inc. Abdominal Pain During Pregnancy  Abdominal pain is common during pregnancy, and has many possible causes. Some causes are more serious than others, and sometimes the cause is not known. Abdominal pain can be a sign that labor is starting. It can also be caused by normal growth and stretching of muscles and ligaments during pregnancy. Always tell your health care provider if you have any abdominal pain. Follow these instructions at home:  Do not have sex or put anything in your vagina until your pain goes away completely.  Get plenty of rest until your pain improves.  Drink enough fluid to keep your urine pale yellow.  Take over-the-counter and prescription medicines only as told by your health care provider.  Keep all follow-up visits as told by your health care provider. This is important. Contact a health care provider if:  Your pain continues or gets worse after resting.  You have lower abdominal pain that: ? Comes and goes at regular intervals. ? Spreads to your back. ? Is similar to menstrual cramps.  You have pain or burning when you  urinate. Get help right away if:  You have a fever or chills.  You have vaginal bleeding.  You are leaking fluid from your vagina.  You are passing tissue from your vagina.  You have vomiting or diarrhea that lasts for more than 24 hours.  Your baby is moving less than usual.  You feel very weak or faint.  You have shortness of breath.  You develop severe pain in your upper abdomen. Summary  Abdominal pain is common during pregnancy, and has many possible causes.  If you experience abdominal pain during pregnancy, tell your health care provider right away.  Follow your health care provider's home care instructions and keep all follow-up visits as directed. This information is not intended to replace advice given to you by your health care provider. Make sure you discuss any questions you have with your health care provider. Document Revised: 11/24/2018 Document Reviewed: 11/08/2016 Elsevier Patient Education  2020 Elsevier Inc.  Morning Sickness  Morning sickness is when a woman feels nauseous during pregnancy. This nauseous feeling may or may not come with vomiting. It often occurs in the morning, but it can be a problem at any time of day. Morning sickness is most common during the first trimester. In some cases, it may continue throughout pregnancy. Although morning sickness is unpleasant, it  is usually harmless unless the woman develops severe and continual vomiting (hyperemesis gravidarum), a condition that requires more intense treatment. What are the causes? The exact cause of this condition is not known, but it seems to be related to normal hormonal changes that occur in pregnancy. What increases the risk? You are more likely to develop this condition if:  You experienced nausea or vomiting before your pregnancy.  You had morning sickness during a previous pregnancy.  You are pregnant with more than one baby, such as twins. What are the signs or  symptoms? Symptoms of this condition include:  Nausea.  Vomiting. How is this diagnosed? This condition is usually diagnosed based on your signs and symptoms. How is this treated? In many cases, treatment is not needed for this condition. Making some changes to what you eat may help to control symptoms. Your health care provider may also prescribe or recommend:  Vitamin B6 supplements.  Anti-nausea medicines.  Ginger. Follow these instructions at home: Medicines  Take over-the-counter and prescription medicines only as told by your health care provider. Do not use any prescription, over-the-counter, or herbal medicines for morning sickness without first talking with your health care provider.  Taking multivitamins before getting pregnant can prevent or decrease the severity of morning sickness in most women. Eating and drinking  Eat a piece of dry toast or crackers before getting out of bed in the morning.  Eat 5 or 6 small meals a day.  Eat dry and bland foods, such as rice or a baked potato. Foods that are high in carbohydrates are often helpful.  Avoid greasy, fatty, and spicy foods.  Have someone cook for you if the smell of any food causes nausea and vomiting.  If you feel nauseous after taking prenatal vitamins, take the vitamins at night or with a snack.  Snack on protein foods between meals if you are hungry. Nuts, yogurt, and cheese are good options.  Drink fluids throughout the day.  Try ginger ale made with real ginger, ginger tea made from fresh grated ginger, or ginger candies. General instructions  Do not use any products that contain nicotine or tobacco, such as cigarettes and e-cigarettes. If you need help quitting, ask your health care provider.  Get an air purifier to keep the air in your house free of odors.  Get plenty of fresh air.  Try to avoid odors that trigger your nausea.  Consider trying these methods to help relieve symptoms: ? Wearing  an acupressure wristband. These wristbands are often worn for seasickness. ? Acupuncture. Contact a health care provider if:  Your home remedies are not working and you need medicine.  You feel dizzy or light-headed.  You are losing weight. Get help right away if:  You have persistent and uncontrolled nausea and vomiting.  You faint.  You have severe pain in your abdomen. Summary  Morning sickness is when a woman feels nauseous during pregnancy. This nauseous feeling may or may not come with vomiting.  Morning sickness is most common during the first trimester.  It often occurs in the morning, but it can be a problem at any time of day.  In many cases, treatment is not needed for this condition. Making some changes to what you eat may help to control symptoms. This information is not intended to replace advice given to you by your health care provider. Make sure you discuss any questions you have with your health care provider. Document Revised: 07/19/2017 Document Reviewed: 09/08/2016  Elsevier Patient Education  [employer].  Cannabinoid Hyperemesis Syndrome Cannabinoid hyperemesis syndrome (CHS) is a condition that causes repeated nausea, vomiting, and abdominal pain after long-term (chronic) use of marijuana (cannabis). People with CHS typically use marijuana 3-5 times a day for many years before they have symptoms, although it is possible to develop CHS with as little as 1 use per day. Symptoms of CHS may be mild at first but can get worse and more frequent. In some cases, CHS may cause vomiting many times a day, which can lead to weight loss and dehydration. CHS may go away and come back many times (recur). People may not have symptoms or may otherwise be healthy in between [facility] attacks. What are the causes? The exact cause of this condition is not known. Long-term use of marijuana may over-stimulate certain proteins in the brain that react with chemicals in marijuana  (cannabinoid receptors). This over-stimulation may cause CHS. What are the signs or symptoms? Symptoms of this condition are often mild during the first few attacks, but they can get worse over time. Symptoms may include:  Frequent nausea, especially early in the morning.  Vomiting.  Abdominal pain. Taking several hot showers throughout the day can also be a sign of this condition. People with CHS may do this because it relieves symptoms. How is this diagnosed? This condition may be diagnosed based on:  Your symptoms and medical history, including any drug use.  A physical exam. You may have tests done to rule out other problems. These tests may include:  Blood tests.  Urine tests.  Imaging tests, such as an X-ray or CT scan. How is this treated? Treatment for this condition involves stopping marijuana use. Your health care provider may recommend:  A drug rehabilitation program, if you have trouble stopping marijuana use.  Medicines for nausea.  Hot showers to help relieve symptoms. Certain creams that contain a substance called capsaicin may improve symptoms when applied to the abdomen. Ask your health care provider before starting any medicines or other treatments. Severe nausea and vomiting may require you to stay at the hospital. You may need IV fluids to prevent or treat dehydration. You may also need certain medicines that must be given at the hospital. Follow these instructions at home: During an attack   Stay in bed and rest in a dark, quiet room.  Take anti-nausea medicine as told by your health care provider.  Try taking hot showers to relieve your symptoms. After an attack  Drink small amounts of clear fluids slowly. Gradually add more.  Once you are able to eat without vomiting, eat soft foods in small amounts every 3-4 hours. General instructions   Do not use any products that contain marijuana.If you need help quitting, ask your health care provider for  resources and treatment options.  Drink enough fluid to keep your urine pale yellow. Avoid drinking fluids that have a lot of sugar or caffeine, such as coffee and soda.  Take and apply over-the-counter and prescription medicines only as told by your health care provider. Ask your health care provider before starting any new medicines or treatments.  Keep all follow-up visits as told by your health care provider. This is important. Contact a health care provider if:  Your symptoms get worse.  You cannot drink fluids without vomiting.  You have pain and trouble swallowing after an attack. Get help right away if:  You cannot stop vomiting.  You have blood in your vomit or  your vomit looks like coffee grounds.  You have severe abdominal pain.  You have stools that are bloody or black, or stools that look like tar.  You have symptoms of dehydration, such as: ? Sunken eyes. ? Inability to make tears. ? Cracked lips. ? Dry mouth. ? Decreased urine production. ? Weakness. ? Sleepiness. ? Fainting. Summary  Cannabinoid hyperemesis syndrome (CHS) is a condition that causes repeated nausea, vomiting, and abdominal pain after long-term use of marijuana.  People with CHS typically use marijuana 3-5 times a day for many years before they have symptoms, although it is possible to develop CHS with as little as 1 use per day.  Treatment for this condition involves stopping marijuana use. Hot showers and capsaicin creams may also help relieve symptoms. Ask your health care provider before starting any medicines or other treatments.  Your health care provider may prescribe medicines to help with nausea.  Get help right away if you have signs of dehydration, such as dry mouth, decreased urine production, or weakness. This information is not intended to replace advice given to you by your health care provider. Make sure you discuss any questions you have with your health care  provider. Document Revised: 12/13/2017 Document Reviewed: 11/14/2016 Elsevier Patient Education  [place].

## 2019-10-05 NOTE — MAU Note (Signed)
Pt found in lobby on floor in hands and knees. Pt was alert and able to get on the wheelchair and transfer to room. Pt states she has vomited 2 times in the past 24 hours and it is yellow. Pt also reports she has had intermittent abdominal cramping 10/10. Pt stats she has not felt baby move today. No vaginal bleeding. Pt also reports having dull chest pain 10/10.

## 2019-10-05 NOTE — MAU Provider Note (Signed)
History     CSN: [id number]  Arrival date and time: 10/05/19 1211   First Provider Initiated Contact with Patient 10/05/19 1239      Chief Complaint  Patient presents with  . Nausea   [name] is a 24 y.o. [id number] at 101w0d who presents to MAU for N/V and stomach and back pain. Pt reports she does still smoke marijuana. Pt reports hot showers and hot baths help her symptoms.  Onset: Saturday 10/03/2019  Location: stomach, low back Duration: 2 days Character: pt reports nausea is occasional, pt reports vomiting x2 in past 24hrs, pt describes stomach and back pain as "it just hurts" Aggravating/Associated:  Relieving: bowel movement, last BM this morning and patient reports it was normal Treatment: Tylenol (last took Saturday), Zofran (last took yesterday) - pt reports it did not help Severity: 10/10  Pt denies VB, vaginal discharge/odor/itching. Pt denies constipation, diarrhea, or urinary problems. Pt denies fever, chills, fatigue, sweating or changes in appetite. Pt denies SOB or chest pain. Pt denies dizziness, HA, light-headedness, weakness.  Problems this pregnancy include: cannabinoid hyperemesis. Allergies? NKDA Current medications/supplements? Pt reports she is not taking any of her medications as prescribed, last took medication yesterday as per above Prenatal care provider? [facility], next appt 10/2019   OB History    Gravida  5   Para  3   Term  3   Preterm      AB  1   Living  3     SAB  0   TAB  1   Ectopic      Multiple  0   Live Births  3           Past Medical History:  Diagnosis Date  . Anemia   . Anxiety   . Blood transfusion without reported diagnosis   . Chronic back pain   . Depression   . GERD (gastroesophageal reflux disease)   . Infection    UTI  . Post partum depression   . Pyelonephritis   . UTI (lower urinary tract infection)     Past Surgical History:  Procedure Laterality Date  . DILATION AND CURETTAGE  OF UTERUS    . INDUCED ABORTION    . WISDOM TOOTH EXTRACTION      Family History  Problem Relation Age of Onset  . Arthritis Mother   . Healthy Father   . Arthritis Maternal Grandmother   . Asthma Son   . Alcohol abuse Neg Hx     Social History   Tobacco Use  . Smoking status: Former Smoker    Quit date: 01/21/2014    Years since quitting: 5.7  . Smokeless tobacco: Never Used  Substance Use Topics  . Alcohol use: Yes    Comment: rarely  . Drug use: Not Currently    Types: Marijuana    Comment: 09/15/2019    Allergies: No Known Allergies  Medications Prior to Admission  Medication Sig Dispense Refill Last Dose  . acetaminophen (TYLENOL) 500 MG tablet Take 1 tablet (500 mg total) by mouth every 6 (six) hours as needed for mild pain or moderate pain. 30 tablet 0 Past Week at Unknown time  . ondansetron (ZOFRAN) 4 MG tablet Take 1 tablet (4 mg total) by mouth every 8 (eight) hours as needed for nausea or vomiting. 20 tablet 0 10/04/2019 at Unknown time  . famotidine (PEPCID) 20 MG tablet Take 1 tablet (20 mg total) by mouth 2 (two) times daily. 30  tablet 0   . ferrous sulfate (FERROUSUL) 325 (65 FE) MG tablet Take 1 tablet (325 mg total) by mouth 2 (two) times daily. 60 tablet 1   . glycopyrrolate (ROBINUL) 1 MG tablet Take 1 tablet (1 mg total) by mouth 3 (three) times daily. 90 tablet 1   . hydrocortisone-pramoxine (PROCTOFOAM-HC) rectal foam Place 1 applicator rectally 2 (two) times daily. 10 g 1   . metoCLOPramide (REGLAN) 10 MG tablet Take 1 tablet (10 mg total) by mouth every 6 (six) hours. 30 tablet 1   . omeprazole (PRILOSEC) 20 MG capsule Take 20 mg by mouth daily.     . pantoprazole (PROTONIX) 20 MG tablet Take 1 tablet (20 mg total) by mouth daily. 30 tablet 2   . Prenatal Vit-Fe Fumarate-FA (PRENATAL MULTIVITAMIN) TABS tablet Take 1 tablet by mouth daily at 12 noon.     . promethazine (PHENADOZ) 25 MG suppository Place 1 suppository (25 mg total) rectally every 6 (six)  hours as needed for nausea or vomiting. 12 each 0   . scopolamine (TRANSDERM-SCOP) 1 MG/3DAYS Place 1 patch (1.5 mg total) onto the skin every 3 (three) days. 10 patch 12     Review of Systems  Constitutional: Negative for chills, diaphoresis, fatigue and fever.  Eyes: Negative for visual disturbance.  Respiratory: Negative for shortness of breath.   Cardiovascular: Negative for chest pain.  Gastrointestinal: Positive for abdominal pain, nausea and vomiting. Negative for constipation and diarrhea.  Genitourinary: Negative for dysuria, flank pain, frequency, pelvic pain, urgency, vaginal bleeding and vaginal discharge.  Musculoskeletal: Positive for back pain.  Neurological: Negative for dizziness, weakness, light-headedness and headaches.   Physical Exam   Blood pressure 120/64, pulse 92, temperature 98.2 F (36.8 C), resp. rate 16, height 5\' 5"  (1.651 m), weight 70.5 kg, last menstrual period 07/06/2019, SpO2 100 %.  Patient Vitals for the past 24 hrs:  BP Temp Temp src Pulse Resp SpO2 Height Weight  10/05/19 1753 -- -- -- -- -- -- 5\' 5"  (1.651 m) 70.5 kg  10/05/19 1549 120/64 98.2 F (36.8 C) -- 92 16 100 % -- --  10/05/19 1225 120/67 97.7 F (36.5 C) Oral 70 17 100 % -- --   Physical Exam  Constitutional: She is oriented to person, place, and time. She appears well-developed and well-nourished. She appears distressed.  HENT:  Head: Normocephalic and atraumatic.  Respiratory: Effort normal.  GI: Soft. She exhibits no distension and no mass. There is abdominal tenderness (mild tenderness on palpation in LLQ). There is no rebound and no guarding.  Genitourinary:    No vaginal discharge.   Musculoskeletal:     Comments: No back pain on palpation.  Neurological: She is alert and oriented to person, place, and time.  Skin: Skin is warm and dry. She is not diaphoretic.  Psychiatric: She has a normal mood and affect. Her behavior is normal. Judgment and thought content normal.    Results for orders placed or performed during the hospital encounter of 10/05/19 (from the past 24 hour(s))  CBC with Differential/Platelet     Status: Abnormal   Collection Time: 10/05/19 12:49 PM  Result Value Ref Range   WBC 7.8 4.0 - 10.5 K/uL   RBC 4.31 3.87 - 5.11 MIL/uL   Hemoglobin 11.3 (L) 12.0 - 15.0 g/dL   HCT 10/07/19 (L) 10/07/19 - 14.4 %   MCV 81.7 80.0 - 100.0 fL   MCH 26.2 26.0 - 34.0 pg   MCHC 32.1 30.0 - 36.0  g/dL   RDW 51.8 (H) 84.1 - 66.0 %   Platelets 363 150 - 400 K/uL   nRBC 0.0 0.0 - 0.2 %   Neutrophils Relative % 78 %   Neutro Abs 6.2 1.7 - 7.7 K/uL   Lymphocytes Relative 13 %   Lymphs Abs 1.0 0.7 - 4.0 K/uL   Monocytes Relative 6 %   Monocytes Absolute 0.4 0.1 - 1.0 K/uL   Eosinophils Relative 2 %   Eosinophils Absolute 0.2 0.0 - 0.5 K/uL   Basophils Relative 1 %   Basophils Absolute 0.0 0.0 - 0.1 K/uL   Immature Granulocytes 0 %   Abs Immature Granulocytes 0.01 0.00 - 0.07 K/uL  Comprehensive metabolic panel     Status: Abnormal   Collection Time: 10/05/19 12:49 PM  Result Value Ref Range   Sodium 136 135 - 145 mmol/L   Potassium 3.8 3.5 - 5.1 mmol/L   Chloride 104 98 - 111 mmol/L   CO2 21 (L) 22 - 32 mmol/L   Glucose, Bld 100 (H) 70 - 99 mg/dL   BUN 6 6 - 20 mg/dL   Creatinine, Ser 6.30 0.44 - 1.00 mg/dL   Calcium 9.3 8.9 - 16.0 mg/dL   Total Protein 7.3 6.5 - 8.1 g/dL   Albumin 3.7 3.5 - 5.0 g/dL   AST 18 15 - 41 U/L   ALT 17 0 - 44 U/L   Alkaline Phosphatase 40 38 - 126 U/L   Total Bilirubin 0.6 0.3 - 1.2 mg/dL   GFR calc non Af Amer >60 >60 mL/min   GFR calc Af Amer >60 >60 mL/min   Anion gap 11 5 - 15  Urinalysis, Routine w reflex microscopic     Status: Abnormal   Collection Time: 10/05/19 12:55 PM  Result Value Ref Range   Color, Urine YELLOW YELLOW   APPearance CLOUDY (A) CLEAR   Specific Gravity, Urine 1.016 1.005 - 1.030   pH 9.0 (H) 5.0 - 8.0   Glucose, UA NEGATIVE NEGATIVE mg/dL   Hgb urine dipstick NEGATIVE NEGATIVE    Bilirubin Urine NEGATIVE NEGATIVE   Ketones, ur NEGATIVE NEGATIVE mg/dL   Protein, ur NEGATIVE NEGATIVE mg/dL   Nitrite NEGATIVE NEGATIVE   Leukocytes,Ua NEGATIVE NEGATIVE  Urine rapid drug screen (hosp performed)     Status: Abnormal   Collection Time: 10/05/19 12:55 PM  Result Value Ref Range   Opiates NONE DETECTED NONE DETECTED   Cocaine NONE DETECTED NONE DETECTED   Benzodiazepines NONE DETECTED NONE DETECTED   Amphetamines NONE DETECTED NONE DETECTED   Tetrahydrocannabinol POSITIVE (A) NONE DETECTED   Barbiturates NONE DETECTED NONE DETECTED    MAU Course  Procedures  MDM -N/V/abdominal and LBP in setting of known marijuana use and previous diagnosis of cannabinoid hyperemesis -EKG performed as patient reports dull chest pain to RN, but not to provider -EKG: normal -UA: cloudy/pH9.0, urine culture sent -UDS: +THC -CBC w/ Diff: WNL for pregnancy -CMP: WNL -1L LR + 40mg  Protonix + 8mg  Zofran + Scopolamine patch given -pt reports symptoms unchanged after admission -consulted with Dr. regarding Haldol use. Per Dr. , should try decadron and Reglan first and if unsuccessful by dinner time, admit to [facility]. -after administration of second group of medications per Dr. Vergie Living and additional L of LR, pt reports abdominal pain and back pain have resolved and now just feel like a "soreness." Per Dr. [facility], pt OK to be discharged home. -confirmed IUP on 08/27/2019 -FHT today 150 -weight today  70.5kg, was 68.4kg on 09/16/2019 -PO challenge successful -pt discharged to home in stable condition  Orders Placed This Encounter  Procedures  . Culture, OB Urine    Standing Status:   Standing    Number of Occurrences:   1  . Urinalysis, Routine w reflex microscopic    Standing Status:   Standing    Number of Occurrences:   1  . Urine rapid drug screen (hosp performed)    Standing Status:   Standing    Number of Occurrences:   1  . CBC with  Differential/Platelet    Standing Status:   Standing    Number of Occurrences:   1  . Comprehensive metabolic panel    Standing Status:   Standing    Number of Occurrences:   1  . EKG 12-Lead    Standing Status:   Standing    Number of Occurrences:   1    Order Specific Question:   Reason for Exam    Answer:   chest pain  . Insert peripheral IV    Standing Status:   Standing    Number of Occurrences:   1  . Discharge patient    Order Specific Question:   Discharge disposition    Answer:   01-Home or Self Care [1]    Order Specific Question:   Discharge patient date    Answer:   10/05/2019   Meds ordered this encounter  Medications  . lactated ringers bolus 1,000 mL  . pantoprazole (PROTONIX) injection 40 mg  . ondansetron (ZOFRAN) 8 mg in sodium chloride 0.9 % 50 mL IVPB  . scopolamine (TRANSDERM-SCOP) 1 MG/3DAYS 1.5 mg  . dexamethasone (DECADRON) injection 10 mg  . metoCLOPramide (REGLAN) injection 10 mg  . lactated ringers bolus 1,000 mL  . scopolamine (TRANSDERM-SCOP, 1.5 MG,) 1 MG/3DAYS    Sig: Place 1 patch (1.5 mg total) onto the skin every 3 (three) days for 8 doses.    Dispense:  4 patch    Refill:  1    Order Specific Question:   Supervising Provider    Answer:   Ontario Bing [[id number]]   Assessment and Plan   1. Cannabinoid hyperemesis syndrome   2. [redacted] weeks gestation of pregnancy    Allergies as of 10/05/2019   No Known Allergies     Medication List    STOP taking these medications   famotidine 20 MG tablet Commonly known as: PEPCID   omeprazole 20 MG capsule Commonly known as: PRILOSEC   promethazine 25 MG suppository Commonly known as: Phenadoz     TAKE these medications   acetaminophen 500 MG tablet Commonly known as: TYLENOL Take 1 tablet (500 mg total) by mouth every 6 (six) hours as needed for mild pain or moderate pain.   ferrous sulfate 325 (65 FE) MG tablet Commonly known as: FerrouSul Take 1 tablet (325 mg total) by mouth 2 (two)  times daily.   glycopyrrolate 1 MG tablet Commonly known as: Robinul Take 1 tablet (1 mg total) by mouth 3 (three) times daily.   hydrocortisone-pramoxine rectal foam Commonly known as: PROCTOFOAM-HC Place 1 applicator rectally 2 (two) times daily.   metoCLOPramide 10 MG tablet Commonly known as: REGLAN Take 1 tablet (10 mg total) by mouth every 6 (six) hours.   ondansetron 4 MG tablet Commonly known as: Zofran Take 1 tablet (4 mg total) by mouth every 8 (eight) hours as needed for nausea or vomiting.   pantoprazole 20 MG tablet  Commonly known as: PROTONIX Take 1 tablet (20 mg total) by mouth daily.   prenatal multivitamin Tabs tablet Take 1 tablet by mouth daily at 12 noon.   scopolamine 1 MG/3DAYS Commonly known as: Transderm-Scop (1.5 MG) Place 1 patch (1.5 mg total) onto the skin every 3 (three) days for 8 doses.      -will call with culture results, if positive -pt reports has nausea and vomiting medication at home with exception of scopolamine, RX sent for scopolamine -pt advised to stop smoking marijuana immediately to avoid further episodes of cannabis hyperemesis syndrome -discussed difficulty of treating cannabis hyperemesis syndrome with traditional medications for N/V -pt advised to take N/V medications as prescribed, around the clock for best relief of symptoms and not to stop taking medication unless instructed by her OB  Gerrie Nordmann Copelyn Widmer 10/05/2019, 6:02 PM

## 2019-10-06 ENCOUNTER — Encounter (HOSPITAL_COMMUNITY): Payer: Self-pay | Admitting: Obstetrics & Gynecology

## 2019-10-06 ENCOUNTER — Other Ambulatory Visit: Payer: Self-pay

## 2019-10-06 ENCOUNTER — Inpatient Hospital Stay (HOSPITAL_COMMUNITY)
Admission: AD | Admit: 2019-10-06 | Discharge: 2019-10-06 | Disposition: A | Discharge status: Home / Self Care | Payer: Medicaid Other | Attending: Obstetrics & Gynecology | Admitting: Obstetrics & Gynecology

## 2019-10-06 DIAGNOSIS — O99011 Anemia complicating pregnancy, first trimester: Secondary | ICD-10-CM | POA: Diagnosis not present

## 2019-10-06 DIAGNOSIS — Z3A13 13 weeks gestation of pregnancy: Secondary | ICD-10-CM | POA: Diagnosis not present

## 2019-10-06 DIAGNOSIS — K219 Gastro-esophageal reflux disease without esophagitis: Secondary | ICD-10-CM | POA: Insufficient documentation

## 2019-10-06 DIAGNOSIS — O219 Vomiting of pregnancy, unspecified: Secondary | ICD-10-CM | POA: Diagnosis present

## 2019-10-06 DIAGNOSIS — R1110 Vomiting, unspecified: Secondary | ICD-10-CM

## 2019-10-06 DIAGNOSIS — Z79899 Other long term (current) drug therapy: Secondary | ICD-10-CM | POA: Insufficient documentation

## 2019-10-06 DIAGNOSIS — Z87891 Personal history of nicotine dependence: Secondary | ICD-10-CM | POA: Insufficient documentation

## 2019-10-06 DIAGNOSIS — D649 Anemia, unspecified: Secondary | ICD-10-CM | POA: Insufficient documentation

## 2019-10-06 DIAGNOSIS — O99611 Diseases of the digestive system complicating pregnancy, first trimester: Secondary | ICD-10-CM | POA: Diagnosis not present

## 2019-10-06 DIAGNOSIS — Z8759 Personal history of other complications of pregnancy, childbirth and the puerperium: Secondary | ICD-10-CM | POA: Diagnosis not present

## 2019-10-06 DIAGNOSIS — E860 Dehydration: Secondary | ICD-10-CM

## 2019-10-06 LAB — URINALYSIS, ROUTINE W REFLEX MICROSCOPIC
Bilirubin Urine: NEGATIVE
Glucose, UA: NEGATIVE mg/dL
Hgb urine dipstick: NEGATIVE
Ketones, ur: 80 mg/dL — AB
Leukocytes,Ua: NEGATIVE
Nitrite: NEGATIVE
Protein, ur: 100 mg/dL — AB
Specific Gravity, Urine: 1.027 (ref 1.005–1.030)
pH: 6 (ref 5.0–8.0)

## 2019-10-06 LAB — COMPREHENSIVE METABOLIC PANEL
ALT: 18 U/L (ref 0–44)
AST: 18 U/L (ref 15–41)
Albumin: 3.6 g/dL (ref 3.5–5.0)
Alkaline Phosphatase: 44 U/L (ref 38–126)
Anion gap: 13 (ref 5–15)
BUN: 8 mg/dL (ref 6–20)
CO2: 21 mmol/L — ABNORMAL LOW (ref 22–32)
Calcium: 9.3 mg/dL (ref 8.9–10.3)
Chloride: 102 mmol/L (ref 98–111)
Creatinine, Ser: 0.51 mg/dL (ref 0.44–1.00)
GFR calc Af Amer: >60 mL/min (ref >60)
GFR calc non Af Amer: >60 mL/min (ref >60)
Glucose, Bld: 94 mg/dL (ref 70–99)
Potassium: 3.3 mmol/L — ABNORMAL LOW (ref 3.5–5.1)
Sodium: 136 mmol/L (ref 135–145)
Total Bilirubin: 0.9 mg/dL (ref 0.3–1.2)
Total Protein: 7.4 g/dL (ref 6.5–8.1)

## 2019-10-06 LAB — CBC
HCT: 33.8 % — ABNORMAL LOW (ref 36.0–46.0)
Hemoglobin: 11 g/dL — ABNORMAL LOW (ref 12.0–15.0)
MCH: 26.3 pg (ref 26.0–34.0)
MCHC: 32.5 g/dL (ref 30.0–36.0)
MCV: 80.9 fL (ref 80.0–100.0)
Platelets: 351 10*3/uL (ref 150–400)
RBC: 4.18 MIL/uL (ref 3.87–5.11)
RDW: 19 % — ABNORMAL HIGH (ref 11.5–15.5)
WBC: 12.3 10*3/uL — ABNORMAL HIGH (ref 4.0–10.5)
nRBC: 0 % (ref 0.0–0.2)

## 2019-10-06 LAB — CULTURE, OB URINE

## 2019-10-06 MED ORDER — METHYLPREDNISOLONE 4 MG PO TBPK: ORAL_TABLET | ORAL | 0 refills | Status: DC

## 2019-10-06 MED ORDER — SCOPOLAMINE 1 MG/3DAYS TD PT72
1.0000 | MEDICATED_PATCH | TRANSDERMAL | Status: DC
  Administered 2019-10-06: 21:00:00 1.5 mg via TRANSDERMAL
  Filled 2019-10-06: qty 1

## 2019-10-06 MED ORDER — DEXAMETHASONE SODIUM PHOSPHATE 10 MG/ML IJ SOLN
10.0000 mg | Freq: Once | INTRAMUSCULAR | Status: AC
  Administered 2019-10-06: 21:00:00 10 mg via INTRAVENOUS
  Filled 2019-10-06: qty 1

## 2019-10-06 MED ORDER — LACTATED RINGERS IV BOLUS
1000.0000 mL | Freq: Once | INTRAVENOUS | Status: AC
  Administered 2019-10-06: 21:00:00 1000 mL via INTRAVENOUS

## 2019-10-06 MED ORDER — METOCLOPRAMIDE HCL 5 MG/ML IJ SOLN
10.0000 mg | Freq: Once | INTRAMUSCULAR | Status: AC
  Administered 2019-10-06: 21:00:00 10 mg via INTRAVENOUS
  Filled 2019-10-06: qty 2

## 2019-10-06 NOTE — Discharge Instructions (Signed)

## 2019-10-06 NOTE — MAU Note (Signed)
Was here yesterday.  Can't hold anything down. Talked to her OB, told to come back "in for fluids and nutrients"

## 2019-10-06 NOTE — MAU Provider Note (Addendum)
Patient TANISA LAGACE is a 24 y.o. [id number] At [redacted]w[redacted]d here with complaints of nausea and vomiting. She has had multiple MAU visits for this problem. She has been counseled extensively on the need to decrease THC use.  She denies vaginal bleeding, cramping. She denies cough, SOB, body aches, chills, fever.  History     CSN: [id number]  Arrival date and time: 10/06/19 1736   First Provider Initiated Contact with Patient 10/06/19 1900      Chief Complaint  Patient presents with  . Emesis   Emesis  This is a chronic problem. The current episode started 1 to 4 weeks ago. The problem occurs 5 to 10 times per day. The emesis has an appearance of bile. There has been no fever. Pertinent negatives include no abdominal pain, diarrhea or fever.  She says that she last used marijuana on Friday.   OB History    Gravida  5   Para  3   Term  3   Preterm      AB  1   Living  3     SAB  0   TAB  1   Ectopic      Multiple  0   Live Births  3           Past Medical History:  Diagnosis Date  . Anemia   . Anxiety   . Blood transfusion without reported diagnosis   . Chronic back pain   . Depression   . GERD (gastroesophageal reflux disease)   . Infection    UTI  . Post partum depression   . Pyelonephritis   . UTI (lower urinary tract infection)     Past Surgical History:  Procedure Laterality Date  . DILATION AND CURETTAGE OF UTERUS    . INDUCED ABORTION    . WISDOM TOOTH EXTRACTION      Family History  Problem Relation Age of Onset  . Arthritis Mother   . Healthy Father   . Arthritis Maternal Grandmother   . Asthma Son   . Alcohol abuse Neg Hx     Social History   Tobacco Use  . Smoking status: Former Smoker    Quit date: 01/21/2014    Years since quitting: 5.7  . Smokeless tobacco: Never Used  Substance Use Topics  . Alcohol use: Yes    Comment: rarely  . Drug use: Not Currently    Types: Marijuana    Comment: 09/15/2019    Allergies: No Known  Allergies  Medications Prior to Admission  Medication Sig Dispense Refill Last Dose  . acetaminophen (TYLENOL) 500 MG tablet Take 1 tablet (500 mg total) by mouth every 6 (six) hours as needed for mild pain or moderate pain. 30 tablet 0   . ferrous sulfate (FERROUSUL) 325 (65 FE) MG tablet Take 1 tablet (325 mg total) by mouth 2 (two) times daily. 60 tablet 1   . glycopyrrolate (ROBINUL) 1 MG tablet Take 1 tablet (1 mg total) by mouth 3 (three) times daily. 90 tablet 1   . hydrocortisone-pramoxine (PROCTOFOAM-HC) rectal foam Place 1 applicator rectally 2 (two) times daily. 10 g 1   . metoCLOPramide (REGLAN) 10 MG tablet Take 1 tablet (10 mg total) by mouth every 6 (six) hours. 30 tablet 1   . ondansetron (ZOFRAN) 4 MG tablet Take 1 tablet (4 mg total) by mouth every 8 (eight) hours as needed for nausea or vomiting. 20 tablet 0   . pantoprazole (PROTONIX) 20 MG  tablet Take 1 tablet (20 mg total) by mouth daily. 30 tablet 2   . Prenatal Vit-Fe Fumarate-FA (PRENATAL MULTIVITAMIN) TABS tablet Take 1 tablet by mouth daily at 12 noon.     Marland Kitchen scopolamine (TRANSDERM-SCOP, 1.5 MG,) 1 MG/3DAYS Place 1 patch (1.5 mg total) onto the skin every 3 (three) days for 8 doses. 4 patch 1     Review of Systems  Constitutional: Negative.  Negative for fever.  Respiratory: Negative.   Gastrointestinal: Positive for vomiting. Negative for abdominal pain, diarrhea and nausea.  Genitourinary: Negative for vaginal bleeding, vaginal discharge and vaginal pain.  Neurological: Negative.    Physical Exam   Blood pressure 132/69, pulse 76, temperature 98.8 F (37.1 C), temperature source Oral, resp. rate 17, height 5\' 5"  (1.651 m), weight 68.3 kg, last menstrual period 07/06/2019, SpO2 98 %.  Physical Exam  Constitutional: She is oriented to person, place, and time. She appears well-developed.  Neurological: She is alert and oriented to person, place, and time.  Skin: Skin is warm and dry.  Psychiatric: She has a  normal mood and affect.    MAU Course  Procedures  MDM 1951: patient is resting in bed; has covers over head.  -will draw CBC and CMP again, give fluid, try decadron and reglan.  -consider sending home on steroid taper.  Last time she used marijuana was Friday.  -UA shows 80 of ketones  -Patient care endorsed to [facility]  With plan to observe, give IV fluids and send home on steroid taper.    Assessment and Plan    Mervyn Skeeters [facility] 10/06/2019, 7:47 PM   Assumed care Weights reviewed (see pink stickynote)  Able to keep down PO intake now  Vitals:   10/06/19 1754 10/06/19 2218  BP: 132/69 (!) 109/58  Pulse: 76 75  Resp: 17   Temp: 98.8 F (37.1 C)   SpO2: 98%    Will discharge home with steroid taper Medrol dosepack ordered Followup in office as scheduled Seabron Spates, CNM

## 2019-10-16 ENCOUNTER — Other Ambulatory Visit: Payer: Self-pay

## 2019-10-16 ENCOUNTER — Ambulatory Visit (HOSPITAL_COMMUNITY)
Admission: EM | Admit: 2019-10-16 | Discharge: 2019-10-16 | Disposition: A | Discharge status: Home / Self Care | Payer: Medicaid Other

## 2019-10-16 ENCOUNTER — Encounter (HOSPITAL_COMMUNITY): Payer: Self-pay

## 2019-10-16 DIAGNOSIS — G43809 Other migraine, not intractable, without status migrainosus: Secondary | ICD-10-CM

## 2019-10-16 MED ORDER — BUTALBITAL-APAP-CAFFEINE 50-325-40 MG PO TABS: 1.0000 | ORAL_TABLET | Freq: Four times a day (QID) | ORAL | 0 refills | Status: DC | PRN

## 2019-10-16 NOTE — Discharge Instructions (Signed)
Since you have not had success with using Tylenol and ibuprofen for your migraine headache, we will follow the protocol to use butalbital-acetaminophen-caffeine.  This is the regimen that is recommended by our research database, up-to-date.  However, this should be limited to only 4 to 5 days/month.  I highly recommend you contact your obstetrician to confirm if they are okay with you taking this medication.  Would also suggest that you consider treatment for sinus infection if your headaches persist.

## 2019-10-16 NOTE — ED Provider Notes (Signed)
[place]   MRN: [id number] DOB: 1995/11/14  Subjective:   Melanie Hancock is a 24 y.o. female presenting for 5 day hx or persistent moderate-severe headache (frontal and over back of her neck). Has been using extra strength Tylenol, used ibuprofen (as recommended) by her ob nurse on call. Has a hx of migraines but has not had one in a while.   No current facility-administered medications for this encounter.  Current Outpatient Medications:  .  acetaminophen (TYLENOL) 500 MG tablet, Take 1 tablet (500 mg total) by mouth every 6 (six) hours as needed for mild pain or moderate pain., Disp: 30 tablet, Rfl: 0 .  Doxylamine-Pyridoxine (DICLEGIS) 10-10 MG TBEC, Diclegis 10 mg-10 mg tablet,delayed release  TAKE 2 TABLETS BY MOUTH AT BEDTIME, Disp: , Rfl:  .  famotidine (PEPCID) 20 MG tablet, famotidine 20 mg tablet  TAKE 1 TABLET BY MOUTH TWICE A DAY, Disp: , Rfl:  .  ferrous sulfate (FERROUSUL) 325 (65 FE) MG tablet, Take 1 tablet (325 mg total) by mouth 2 (two) times daily., Disp: 60 tablet, Rfl: 1 .  glycopyrrolate (ROBINUL) 1 MG tablet, Take 1 tablet (1 mg total) by mouth 3 (three) times daily., Disp: 90 tablet, Rfl: 1 .  hydrocortisone-pramoxine (PROCTOFOAM HC) rectal foam, Proctofoam HC 1 %-1 %  PLACE 1 APPLICATOR RECTALLY 2 (TWO) TIMES DAILY., Disp: , Rfl:  .  hydrocortisone-pramoxine (PROCTOFOAM-HC) rectal foam, Place 1 applicator rectally 2 (two) times daily., Disp: 10 g, Rfl: 1 .  methylPREDNISolone (MEDROL DOSEPAK) 4 MG TBPK tablet, Take as directed, Disp: 1 each, Rfl: 0 .  metoCLOPramide (REGLAN) 10 MG tablet, Take 1 tablet (10 mg total) by mouth every 6 (six) hours., Disp: 30 tablet, Rfl: 1 .  ondansetron (ZOFRAN) 4 MG tablet, Take 1 tablet (4 mg total) by mouth every 8 (eight) hours as needed for nausea or vomiting., Disp: 20 tablet, Rfl: 0 .  ondansetron (ZOFRAN) 4 MG tablet, Zofran 4 mg tablet  Take 1 tablet(s) every 4 hours by oral route as needed for nausea, Disp: , Rfl:    .  ondansetron (ZOFRAN-ODT) 4 MG disintegrating tablet, ondansetron 4 mg disintegrating tablet  Place 1 tablet twice a day by translingual route as needed., Disp: , Rfl:  .  pantoprazole (PROTONIX) 20 MG tablet, Take 1 tablet (20 mg total) by mouth daily., Disp: 30 tablet, Rfl: 2 .  Prenatal Vit-Fe Fumarate-FA (PRENATAL MULTIVITAMIN) TABS tablet, Take 1 tablet by mouth daily at 12 noon., Disp: , Rfl:  .  prochlorperazine (COMPAZINE) 10 MG tablet, Take 10 mg by mouth every 6 (six) hours as needed., Disp: , Rfl:  .  prochlorperazine (COMPAZINE) 10 MG tablet, prochlorperazine maleate 10 mg tablet  TAKE 1 TABLET (10 MG TOTAL) BY MOUTH EVERY 6 (SIX) HOURS AS NEEDED FOR NAUSEA OR VOMITING., Disp: , Rfl:  .  promethazine (PHENERGAN) 25 MG suppository, promethazine 25 mg rectal suppository  Place 1 suppository (25 mg total) rectally every 8 (eight) hours as needed for nausea or vomiting., Disp: , Rfl:  .  promethazine (PHENERGAN) 25 MG tablet, promethazine 25 mg tablet  TAKE 1 TABLET (25 MG TOTAL) BY MOUTH EVERY 6 (SIX) HOURS AS NEEDED FOR NAUSEA OR VOMITING., Disp: , Rfl:  .  pyridOXINE (VITAMIN B-6) 25 MG tablet, Vitamin B-6 25 mg tablet  TAKE 1 TABLET (25 MG TOTAL) BY MOUTH EVERY 8 (EIGHT) HOURS., Disp: , Rfl:  .  scopolamine (TRANSDERM-SCOP, 1.5 MG,) 1 MG/3DAYS, Place 1 patch (1.5 mg total) onto  the skin every 3 (three) days for 8 doses., Disp: 4 patch, Rfl: 1 .  valACYclovir (VALTREX) 1000 MG tablet, valacyclovir 1 gram tablet  TAKE 1 TABLET BY MOUTH EVERY DAY, Disp: , Rfl:  .  zolpidem (AMBIEN) 5 MG tablet, zolpidem 5 mg tablet  TAKE 1 TABLET BY MOUTH AT BEDTIME AS NEEDED FOR SLEEP WAITING ON PA, Disp: , Rfl:    No Known Allergies  Past Medical History:  Diagnosis Date  . Anemia   . Anxiety   . Blood transfusion without reported diagnosis   . Chronic back pain   . Depression   . GERD (gastroesophageal reflux disease)   . Infection    UTI  . Post partum depression   . Pyelonephritis   . UTI  (lower urinary tract infection)      Past Surgical History:  Procedure Laterality Date  . DILATION AND CURETTAGE OF UTERUS    . INDUCED ABORTION    . WISDOM TOOTH EXTRACTION      Family History  Problem Relation Age of Onset  . Arthritis Mother   . Healthy Father   . Arthritis Maternal Grandmother   . Asthma Son   . Alcohol abuse Neg Hx     Social History   Tobacco Use  . Smoking status: Former Smoker    Quit date: 01/21/2014    Years since quitting: 5.7  . Smokeless tobacco: Never Used  Substance Use Topics  . Alcohol use: Yes    Comment: rarely  . Drug use: Not Currently    Types: Marijuana    Comment: 09/15/2019    Review of Systems  Constitutional: Negative for fever and malaise/fatigue.  HENT: Negative for congestion, ear pain, sinus pain and sore throat.   Eyes: Positive for photophobia. Negative for blurred vision, double vision, discharge and redness.  Respiratory: Negative for cough, hemoptysis, shortness of breath and wheezing.   Cardiovascular: Negative for chest pain.  Gastrointestinal: Negative for abdominal pain, diarrhea, nausea and vomiting.  Genitourinary: Negative for dysuria, flank pain and hematuria.  Musculoskeletal: Negative for myalgias.  Skin: Negative for rash.  Neurological: Positive for dizziness and headaches. Negative for focal weakness, seizures and weakness.  Psychiatric/Behavioral: Negative for depression and substance abuse.     Objective:   Vitals: BP 98/63 (BP Location: Right Arm)   Pulse 95   Temp 98.4 F (36.9 C) (Oral)   Resp 18   Wt 158 lb (71.7 kg)   LMP 07/06/2019   SpO2 98%   BMI 26.29 kg/m   Physical Exam Constitutional:      General: She is not in acute distress.    Appearance: Normal appearance. She is well-developed. She is not ill-appearing, toxic-appearing or diaphoretic.  HENT:     Head: Normocephalic and atraumatic.     Right Ear: Tympanic membrane and ear canal normal. No drainage or tenderness. No  middle ear effusion. Tympanic membrane is not erythematous.     Left Ear: Tympanic membrane and ear canal normal. No drainage or tenderness.  No middle ear effusion. Tympanic membrane is not erythematous.     Nose: Nose normal. No congestion or rhinorrhea.     Mouth/Throat:     Mouth: Mucous membranes are moist. No oral lesions.     Pharynx: Oropharynx is clear. No pharyngeal swelling, oropharyngeal exudate, posterior oropharyngeal erythema or uvula swelling.     Tonsils: No tonsillar exudate or tonsillar abscesses.  Eyes:     General: No scleral icterus.  Right eye: No discharge.        Left eye: No discharge.     Extraocular Movements: Extraocular movements intact.     Right eye: Normal extraocular motion.     Left eye: Normal extraocular motion.     Conjunctiva/sclera: Conjunctivae normal.     Pupils: Pupils are equal, round, and reactive to light.  Cardiovascular:     Rate and Rhythm: Normal rate.  Pulmonary:     Effort: Pulmonary effort is normal.  Musculoskeletal:     Cervical back: Normal range of motion and neck supple.  Lymphadenopathy:     Cervical: No cervical adenopathy.  Skin:    General: Skin is warm and dry.  Neurological:     General: No focal deficit present.     Mental Status: She is alert and oriented to person, place, and time.     Cranial Nerves: No cranial nerve deficit.     Motor: No weakness.     Coordination: Coordination normal.     Gait: Gait normal.     Deep Tendon Reflexes: Reflexes normal.  Psychiatric:        Mood and Affect: Mood normal.        Behavior: Behavior normal.        Thought Content: Thought content normal.        Judgment: Judgment normal.     Assessment and Plan :   1. Other migraine without status migrainosus, not intractable     Patient has failed treatment with Tylenol and ibuprofen.  As per up-to-date first-line therapy when acetaminophen alone has failed is to use either butalbital, acetaminophen with codeine or  metoclopramide.  Patient states that the medication she was given for migraine before with caffeine and Tylenol helped.  Therefore will use butalbital.  I will follow the guidelines of limiting this to 4-5 doses in a month.  Patient is to follow-up very closely with her obstetrician.  Counseled that she should trial an antibiotic for sinusitis if her headaches persist.  She was agreeable to this. Counseled patient on potential for adverse effects with medications prescribed/recommended today, ER and return-to-clinic precautions discussed, patient verbalized understanding.    Wallis Bamberg, [place] 10/16/19 1317

## 2019-10-16 NOTE — ED Triage Notes (Signed)
Pt is here with a migraine that started Sunday, she has taken Advil to relieve discomfort.

## 2019-10-24 ENCOUNTER — Inpatient Hospital Stay (HOSPITAL_COMMUNITY)
Admission: AD | Admit: 2019-10-24 | Discharge: 2019-10-25 | Disposition: A | Discharge status: Home / Self Care | Payer: Medicaid Other | Attending: Obstetrics & Gynecology | Admitting: Obstetrics & Gynecology

## 2019-10-24 ENCOUNTER — Other Ambulatory Visit: Payer: Self-pay

## 2019-10-24 ENCOUNTER — Encounter (HOSPITAL_COMMUNITY): Payer: Self-pay | Admitting: Obstetrics & Gynecology

## 2019-10-24 DIAGNOSIS — M549 Dorsalgia, unspecified: Secondary | ICD-10-CM | POA: Diagnosis not present

## 2019-10-24 DIAGNOSIS — O210 Mild hyperemesis gravidarum: Secondary | ICD-10-CM | POA: Diagnosis not present

## 2019-10-24 DIAGNOSIS — Z3A15 15 weeks gestation of pregnancy: Secondary | ICD-10-CM | POA: Insufficient documentation

## 2019-10-24 DIAGNOSIS — Z87891 Personal history of nicotine dependence: Secondary | ICD-10-CM | POA: Insufficient documentation

## 2019-10-24 DIAGNOSIS — R109 Unspecified abdominal pain: Secondary | ICD-10-CM | POA: Insufficient documentation

## 2019-10-24 DIAGNOSIS — N760 Acute vaginitis: Secondary | ICD-10-CM

## 2019-10-24 DIAGNOSIS — M545 Low back pain, unspecified: Secondary | ICD-10-CM

## 2019-10-24 DIAGNOSIS — O219 Vomiting of pregnancy, unspecified: Secondary | ICD-10-CM

## 2019-10-24 DIAGNOSIS — B9689 Other specified bacterial agents as the cause of diseases classified elsewhere: Secondary | ICD-10-CM

## 2019-10-24 DIAGNOSIS — O26892 Other specified pregnancy related conditions, second trimester: Secondary | ICD-10-CM

## 2019-10-24 LAB — COMPREHENSIVE METABOLIC PANEL
ALT: 17 U/L (ref 0–44)
AST: 17 U/L (ref 15–41)
Albumin: 3.7 g/dL (ref 3.5–5.0)
Alkaline Phosphatase: 52 U/L (ref 38–126)
Anion gap: 12 (ref 5–15)
BUN: 7 mg/dL (ref 6–20)
CO2: 19 mmol/L — ABNORMAL LOW (ref 22–32)
Calcium: 9.1 mg/dL (ref 8.9–10.3)
Chloride: 106 mmol/L (ref 98–111)
Creatinine, Ser: 0.55 mg/dL (ref 0.44–1.00)
GFR calc Af Amer: >60 mL/min (ref >60)
GFR calc non Af Amer: >60 mL/min (ref >60)
Glucose, Bld: 86 mg/dL (ref 70–99)
Potassium: 3.5 mmol/L (ref 3.5–5.1)
Sodium: 137 mmol/L (ref 135–145)
Total Bilirubin: 0.6 mg/dL (ref 0.3–1.2)
Total Protein: 7.2 g/dL (ref 6.5–8.1)

## 2019-10-24 LAB — CBC
HCT: 34.7 % — ABNORMAL LOW (ref 36.0–46.0)
Hemoglobin: 11.2 g/dL — ABNORMAL LOW (ref 12.0–15.0)
MCH: 27 pg (ref 26.0–34.0)
MCHC: 32.3 g/dL (ref 30.0–36.0)
MCV: 83.6 fL (ref 80.0–100.0)
Platelets: 371 10*3/uL (ref 150–400)
RBC: 4.15 MIL/uL (ref 3.87–5.11)
RDW: 18 % — ABNORMAL HIGH (ref 11.5–15.5)
WBC: 7.7 10*3/uL (ref 4.0–10.5)
nRBC: 0 % (ref 0.0–0.2)

## 2019-10-24 LAB — LIPASE, BLOOD: Lipase: 20 U/L (ref 11–51)

## 2019-10-24 LAB — AMYLASE: Amylase: 63 U/L (ref 28–100)

## 2019-10-24 MED ORDER — LACTATED RINGERS IV BOLUS
1000.0000 mL | Freq: Once | INTRAVENOUS | Status: AC
  Administered 2019-10-24: 1000 mL via INTRAVENOUS

## 2019-10-24 MED ORDER — PROMETHAZINE HCL 25 MG/ML IJ SOLN
25.0000 mg | Freq: Once | INTRAMUSCULAR | Status: AC
  Administered 2019-10-24: 25 mg via INTRAVENOUS
  Filled 2019-10-24: qty 1

## 2019-10-24 MED ORDER — MORPHINE SULFATE (PF) 4 MG/ML IV SOLN
4.0000 mg | Freq: Once | INTRAVENOUS | Status: AC
  Administered 2019-10-24: 4 mg via INTRAVENOUS
  Filled 2019-10-24: qty 1

## 2019-10-24 NOTE — MAU Provider Note (Signed)
History     CSN: [id number]  Arrival date and time: 10/24/19 2049   First Provider Initiated Contact with Patient 10/24/19 2142      Chief Complaint  Patient presents with  . Nausea  . Emesis   Melanie Hancock is a 24 y.o. [id number] at [redacted]w[redacted]d who receives care at [facility].  She presents today for Nausea, Emesis, and Abdominal and Back Pain.  She states the pain started today and she describes it as a sharp shooting pain.  Patient reports that she has tried Naproxen and Flexeril, between 1-2pm, without relief of the pain.  She states the pain is not improved with any known factors and is worsened with movement.  Patient states she started having nausea and vomiting after taking the medicine.  She reports she has been unable to eat today and has drank some fluids, but without success. Patient without vomiting while provider at bedside.      OB History    Gravida  5   Para  3   Term  3   Preterm      AB  1   Living  3     SAB  0   TAB  1   Ectopic      Multiple  0   Live Births  3           Past Medical History:  Diagnosis Date  . Anemia   . Anxiety   . Blood transfusion without reported diagnosis   . Chronic back pain   . Depression   . GERD (gastroesophageal reflux disease)   . Infection    UTI  . Post partum depression   . Pyelonephritis   . UTI (lower urinary tract infection)     Past Surgical History:  Procedure Laterality Date  . DILATION AND CURETTAGE OF UTERUS    . INDUCED ABORTION    . WISDOM TOOTH EXTRACTION      Family History  Problem Relation Age of Onset  . Arthritis Mother   . Healthy Father   . Arthritis Maternal Grandmother   . Asthma Son   . Alcohol abuse Neg Hx     Social History   Tobacco Use  . Smoking status: Former Smoker    Quit date: 01/21/2014    Years since quitting: 5.7  . Smokeless tobacco: Never Used  Substance Use Topics  . Alcohol use: Not Currently    Comment: rarely  . Drug use: Not Currently    Types:  Marijuana    Comment: today    Allergies: No Known Allergies  Medications Prior to Admission  Medication Sig Dispense Refill Last Dose  . butalbital-acetaminophen-caffeine (FIORICET) 50-325-40 MG tablet Take 1 tablet by mouth every 6 (six) hours as needed for headache. 5 tablet 0 Past Month at Unknown time  . acetaminophen (TYLENOL) 500 MG tablet Take 1 tablet (500 mg total) by mouth every 6 (six) hours as needed for mild pain or moderate pain. 30 tablet 0   . Doxylamine-Pyridoxine (DICLEGIS) 10-10 MG TBEC Diclegis 10 mg-10 mg tablet,delayed release  TAKE 2 TABLETS BY MOUTH AT BEDTIME     . famotidine (PEPCID) 20 MG tablet famotidine 20 mg tablet  TAKE 1 TABLET BY MOUTH TWICE A DAY     . ferrous sulfate (FERROUSUL) 325 (65 FE) MG tablet Take 1 tablet (325 mg total) by mouth 2 (two) times daily. 60 tablet 1   . glycopyrrolate (ROBINUL) 1 MG tablet Take 1 tablet (1 mg  total) by mouth 3 (three) times daily. 90 tablet 1   . hydrocortisone-pramoxine (PROCTOFOAM HC) rectal foam Proctofoam HC 1 %-1 %  PLACE 1 APPLICATOR RECTALLY 2 (TWO) TIMES DAILY.     . hydrocortisone-pramoxine (PROCTOFOAM-HC) rectal foam Place 1 applicator rectally 2 (two) times daily. 10 g 1   . methylPREDNISolone (MEDROL DOSEPAK) 4 MG TBPK tablet Take as directed 1 each 0   . metoCLOPramide (REGLAN) 10 MG tablet Take 1 tablet (10 mg total) by mouth every 6 (six) hours. 30 tablet 1   . ondansetron (ZOFRAN) 4 MG tablet Take 1 tablet (4 mg total) by mouth every 8 (eight) hours as needed for nausea or vomiting. 20 tablet 0   . ondansetron (ZOFRAN) 4 MG tablet Zofran 4 mg tablet  Take 1 tablet(s) every 4 hours by oral route as needed for nausea     . ondansetron (ZOFRAN-ODT) 4 MG disintegrating tablet ondansetron 4 mg disintegrating tablet  Place 1 tablet twice a day by translingual route as needed.     . pantoprazole (PROTONIX) 20 MG tablet Take 1 tablet (20 mg total) by mouth daily. 30 tablet 2   . Prenatal Vit-Fe Fumarate-FA  (PRENATAL MULTIVITAMIN) TABS tablet Take 1 tablet by mouth daily at 12 noon.     . prochlorperazine (COMPAZINE) 10 MG tablet Take 10 mg by mouth every 6 (six) hours as needed.     . prochlorperazine (COMPAZINE) 10 MG tablet prochlorperazine maleate 10 mg tablet  TAKE 1 TABLET (10 MG TOTAL) BY MOUTH EVERY 6 (SIX) HOURS AS NEEDED FOR NAUSEA OR VOMITING.     . promethazine (PHENERGAN) 25 MG suppository promethazine 25 mg rectal suppository  Place 1 suppository (25 mg total) rectally every 8 (eight) hours as needed for nausea or vomiting.     . promethazine (PHENERGAN) 25 MG tablet promethazine 25 mg tablet  TAKE 1 TABLET (25 MG TOTAL) BY MOUTH EVERY 6 (SIX) HOURS AS NEEDED FOR NAUSEA OR VOMITING.     Marland Kitchen pyridOXINE (VITAMIN B-6) 25 MG tablet Vitamin B-6 25 mg tablet  TAKE 1 TABLET (25 MG TOTAL) BY MOUTH EVERY 8 (EIGHT) HOURS.     Marland Kitchen scopolamine (TRANSDERM-SCOP, 1.5 MG,) 1 MG/3DAYS Place 1 patch (1.5 mg total) onto the skin every 3 (three) days for 8 doses. 4 patch 1   . valACYclovir (VALTREX) 1000 MG tablet valacyclovir 1 gram tablet  TAKE 1 TABLET BY MOUTH EVERY DAY     . zolpidem (AMBIEN) 5 MG tablet zolpidem 5 mg tablet  TAKE 1 TABLET BY MOUTH AT BEDTIME AS NEEDED FOR SLEEP WAITING ON PA       Review of Systems  Constitutional: Negative for chills and fever.  Gastrointestinal: Positive for abdominal pain, nausea and vomiting. Negative for constipation and diarrhea.  Genitourinary: Negative for difficulty urinating, dysuria, pelvic pain, vaginal bleeding and vaginal discharge.  Musculoskeletal: Positive for back pain.  Neurological: Negative for dizziness, light-headedness and headaches.   Physical Exam   Blood pressure 117/61, pulse 86, temperature 98.8 F (37.1 C), resp. rate 18, last menstrual period 07/06/2019.  Physical Exam  Constitutional: She is oriented to person, place, and time. She appears well-developed and well-nourished. She appears distressed.   Patient in bed on hands and  knees with warm compresses to lower back.  Tearful.   HENT:  Head: Normocephalic and atraumatic.  Eyes: Conjunctivae are normal.  Cardiovascular: Normal rate, regular rhythm and normal heart sounds.  Respiratory: Effort normal and breath sounds normal. No respiratory distress.  GI:  Patient unwilling to lay on back for abdominal assessment.   Musculoskeletal:     Cervical back: Normal range of motion.     Comments: Bilateral Lower Back Pain. Tender to touch.   Neurological: She is alert and oriented to person, place, and time.  Skin: Skin is warm and dry.  Psychiatric: She has a normal mood and affect. Her behavior is normal.    MAU Course  Procedures Results for orders placed or performed during the hospital encounter of 10/24/19 (from the past 24 hour(s))  CBC     Status: Abnormal   Collection Time: 10/24/19 10:23 PM  Result Value Ref Range   WBC 7.7 4.0 - 10.5 K/uL   RBC 4.15 3.87 - 5.11 MIL/uL   Hemoglobin 11.2 (L) 12.0 - 15.0 g/dL   HCT 74.2 (L) 59.5 - 63.8 %   MCV 83.6 80.0 - 100.0 fL   MCH 27.0 26.0 - 34.0 pg   MCHC 32.3 30.0 - 36.0 g/dL   RDW 75.6 (H) 43.3 - 29.5 %   Platelets 371 150 - 400 K/uL   nRBC 0.0 0.0 - 0.2 %  Comprehensive metabolic panel     Status: Abnormal   Collection Time: 10/24/19 10:23 PM  Result Value Ref Range   Sodium 137 135 - 145 mmol/L   Potassium 3.5 3.5 - 5.1 mmol/L   Chloride 106 98 - 111 mmol/L   CO2 19 (L) 22 - 32 mmol/L   Glucose, Bld 86 70 - 99 mg/dL   BUN 7 6 - 20 mg/dL   Creatinine, Ser 1.88 0.44 - 1.00 mg/dL   Calcium 9.1 8.9 - 41.6 mg/dL   Total Protein 7.2 6.5 - 8.1 g/dL   Albumin 3.7 3.5 - 5.0 g/dL   AST 17 15 - 41 U/L   ALT 17 0 - 44 U/L   Alkaline Phosphatase 52 38 - 126 U/L   Total Bilirubin 0.6 0.3 - 1.2 mg/dL   GFR calc non Af Amer >60 >60 mL/min   GFR calc Af Amer >60 >60 mL/min   Anion gap 12 5 - 15  Amylase     Status: None   Collection Time: 10/24/19 10:23 PM  Result Value Ref Range   Amylase 63 28 - 100 U/L   Lipase, blood     Status: None   Collection Time: 10/24/19 10:23 PM  Result Value Ref Range   Lipase 20 11 - 51 U/L    MDM Start IV LR Bolus Antiemetic Pain Medication Labs: CBC,CMP, Lipase, Amylase, UA Assessment and Plan  24 year old, [id number] SIUP at 15.5weeks N/V Back Pain Abdominal Pain  -Start IV and give LR -Labs as above -Phenergan 25mg  IV -Will give morphine 4mg  for pain  10/24/2019, 9:42 PM   Reassessment (12:01 AM)  -Patient asleep in bed.  Covers pulled over her face. -Reports that she is unable to urinate. -Will give additional bag of fluid.   Reassessment (1:18 AM)  -Patient assisted up to bathroom with provider and nurse support. -Urine appears concentrated.  Will give additional bag LR -Patient reports back pain has improved significantly. *Provider reexamines and bilateral tenderness resolved.  -Patient reports abdominal pain in lower quadrants bilaterally. *Soft, Nontender, BS x 4Q -BSUS performed to assess FHT. -Will send GC/CT and Wet Prep to rule out infectious process resulting in pain.  -Patient states next appt in office on March 10th. -Will await results and continue to monitor.   Reassessment (2:45 AM)  Bacterial Vaginosis  -Wet prep returns with clue cells. -Nurse reports fluids complete. -Instructed to inform patient of BV diagnosis. -Rx for Metrogel to pharmacy on file. -Encourage to call or return to MAU if symptoms worsen or with the onset of new symptoms. -Discharged to home in stable condition.  Maryann Conners MSN, CNM Advanced Practice Provider, [facility]

## 2019-10-24 NOTE — MAU Note (Signed)
Pt took naproxen and flexeril for sciatic pain. Nausea and vomiting started after. Pt c/o lower abd and back pain

## 2019-10-25 DIAGNOSIS — O219 Vomiting of pregnancy, unspecified: Secondary | ICD-10-CM

## 2019-10-25 DIAGNOSIS — Z3A15 15 weeks gestation of pregnancy: Secondary | ICD-10-CM

## 2019-10-25 LAB — URINALYSIS, ROUTINE W REFLEX MICROSCOPIC
Bilirubin Urine: NEGATIVE
Glucose, UA: NEGATIVE mg/dL
Hgb urine dipstick: NEGATIVE
Ketones, ur: 80 mg/dL — AB
Leukocytes,Ua: NEGATIVE
Nitrite: NEGATIVE
Protein, ur: 30 mg/dL — AB
Specific Gravity, Urine: 1.023 (ref 1.005–1.030)
pH: 7 (ref 5.0–8.0)

## 2019-10-25 LAB — WET PREP, GENITAL
Sperm: NONE SEEN
Trich, Wet Prep: NONE SEEN
Yeast Wet Prep HPF POC: NONE SEEN

## 2019-10-25 MED ORDER — LACTATED RINGERS IV BOLUS
1000.0000 mL | Freq: Once | INTRAVENOUS | Status: AC
  Administered 2019-10-25: 1000 mL via INTRAVENOUS

## 2019-10-25 MED ORDER — METRONIDAZOLE 0.75 % VA GEL: 1.0000 | Freq: Every day | VAGINAL | 0 refills | Status: DC

## 2019-10-26 ENCOUNTER — Encounter (HOSPITAL_COMMUNITY): Payer: Self-pay | Admitting: Obstetrics and Gynecology

## 2019-10-26 ENCOUNTER — Other Ambulatory Visit: Payer: Self-pay

## 2019-10-26 ENCOUNTER — Inpatient Hospital Stay (HOSPITAL_COMMUNITY)
Admission: AD | Admit: 2019-10-26 | Discharge: 2019-10-26 | Disposition: A | Discharge status: Home / Self Care | Payer: Medicaid Other | Attending: Obstetrics and Gynecology | Admitting: Obstetrics and Gynecology

## 2019-10-26 DIAGNOSIS — Z886 Allergy status to analgesic agent status: Secondary | ICD-10-CM | POA: Insufficient documentation

## 2019-10-26 DIAGNOSIS — F12188 Cannabis abuse with other cannabis-induced disorder: Secondary | ICD-10-CM | POA: Insufficient documentation

## 2019-10-26 DIAGNOSIS — Z87891 Personal history of nicotine dependence: Secondary | ICD-10-CM | POA: Insufficient documentation

## 2019-10-26 DIAGNOSIS — O99322 Drug use complicating pregnancy, second trimester: Secondary | ICD-10-CM | POA: Diagnosis not present

## 2019-10-26 DIAGNOSIS — O210 Mild hyperemesis gravidarum: Secondary | ICD-10-CM | POA: Diagnosis present

## 2019-10-26 DIAGNOSIS — O219 Vomiting of pregnancy, unspecified: Secondary | ICD-10-CM | POA: Diagnosis not present

## 2019-10-26 DIAGNOSIS — Z3A16 16 weeks gestation of pregnancy: Secondary | ICD-10-CM | POA: Diagnosis not present

## 2019-10-26 DIAGNOSIS — E876 Hypokalemia: Secondary | ICD-10-CM

## 2019-10-26 LAB — COMPREHENSIVE METABOLIC PANEL
ALT: 15 U/L (ref 0–44)
AST: 12 U/L — ABNORMAL LOW (ref 15–41)
Albumin: 3.5 g/dL (ref 3.5–5.0)
Alkaline Phosphatase: 44 U/L (ref 38–126)
Anion gap: 9 (ref 5–15)
BUN: 8 mg/dL (ref 6–20)
CO2: 20 mmol/L — ABNORMAL LOW (ref 22–32)
Calcium: 9 mg/dL (ref 8.9–10.3)
Chloride: 106 mmol/L (ref 98–111)
Creatinine, Ser: 0.51 mg/dL (ref 0.44–1.00)
GFR calc Af Amer: >60 mL/min (ref >60)
GFR calc non Af Amer: >60 mL/min (ref >60)
Glucose, Bld: 86 mg/dL (ref 70–99)
Potassium: 3.1 mmol/L — ABNORMAL LOW (ref 3.5–5.1)
Sodium: 135 mmol/L (ref 135–145)
Total Bilirubin: 0.6 mg/dL (ref 0.3–1.2)
Total Protein: 7.1 g/dL (ref 6.5–8.1)

## 2019-10-26 LAB — URINALYSIS, ROUTINE W REFLEX MICROSCOPIC
Bilirubin Urine: NEGATIVE
Glucose, UA: NEGATIVE mg/dL
Hgb urine dipstick: NEGATIVE
Ketones, ur: 80 mg/dL — AB
Leukocytes,Ua: NEGATIVE
Nitrite: NEGATIVE
Protein, ur: 100 mg/dL — AB
Specific Gravity, Urine: 1.029 (ref 1.005–1.030)
pH: 6 (ref 5.0–8.0)

## 2019-10-26 LAB — CBC WITH DIFFERENTIAL/PLATELET
Abs Immature Granulocytes: 0.02 10*3/uL (ref 0.00–0.07)
Basophils Absolute: 0 10*3/uL (ref 0.0–0.1)
Basophils Relative: 0 %
Eosinophils Absolute: 0 10*3/uL (ref 0.0–0.5)
Eosinophils Relative: 0 %
HCT: 33.6 % — ABNORMAL LOW (ref 36.0–46.0)
Hemoglobin: 11.2 g/dL — ABNORMAL LOW (ref 12.0–15.0)
Immature Granulocytes: 0 %
Lymphocytes Relative: 12 %
Lymphs Abs: 1.1 10*3/uL (ref 0.7–4.0)
MCH: 27.4 pg (ref 26.0–34.0)
MCHC: 33.3 g/dL (ref 30.0–36.0)
MCV: 82.2 fL (ref 80.0–100.0)
Monocytes Absolute: 0.7 10*3/uL (ref 0.1–1.0)
Monocytes Relative: 8 %
Neutro Abs: 7.1 10*3/uL (ref 1.7–7.7)
Neutrophils Relative %: 80 %
Platelets: 351 10*3/uL (ref 150–400)
RBC: 4.09 MIL/uL (ref 3.87–5.11)
RDW: 17.7 % — ABNORMAL HIGH (ref 11.5–15.5)
WBC: 8.9 10*3/uL (ref 4.0–10.5)
nRBC: 0 % (ref 0.0–0.2)

## 2019-10-26 LAB — GC/CHLAMYDIA PROBE AMP (~~LOC~~) NOT AT ARMC
Chlamydia: NEGATIVE
Comment: NEGATIVE
Comment: NORMAL
Neisseria Gonorrhea: NEGATIVE

## 2019-10-26 MED ORDER — SCOPOLAMINE 1 MG/3DAYS TD PT72
1.0000 | MEDICATED_PATCH | TRANSDERMAL | Status: DC
  Administered 2019-10-26: 1.5 mg via TRANSDERMAL
  Filled 2019-10-26: qty 1

## 2019-10-26 MED ORDER — LACTATED RINGERS IV BOLUS
1000.0000 mL | Freq: Once | INTRAVENOUS | Status: AC
  Administered 2019-10-26: 1000 mL via INTRAVENOUS

## 2019-10-26 MED ORDER — PANTOPRAZOLE SODIUM 40 MG IV SOLR
40.0000 mg | Freq: Once | INTRAVENOUS | Status: AC
  Administered 2019-10-26: 40 mg via INTRAVENOUS
  Filled 2019-10-26: qty 40

## 2019-10-26 MED ORDER — ONDANSETRON 8 MG PO TBDP: 8.0000 mg | ORAL_TABLET | Freq: Three times a day (TID) | ORAL | 0 refills | Status: AC | PRN

## 2019-10-26 MED ORDER — SODIUM CHLORIDE 0.9 % IV SOLN
8.0000 mg | INTRAVENOUS | Status: AC
  Administered 2019-10-26: 8 mg via INTRAVENOUS
  Filled 2019-10-26 (×2): qty 4

## 2019-10-26 MED ORDER — SCOPOLAMINE 1 MG/3DAYS TD PT72: 1.0000 | MEDICATED_PATCH | TRANSDERMAL | 1 refills | Status: AC

## 2019-10-26 NOTE — MAU Provider Note (Signed)
History     CSN: [id number]  Arrival date and time: 10/26/19 1036   First Provider Initiated Contact with Patient 10/26/19 1133      Chief Complaint  Patient presents with  . Emesis   Melanie Hancock is a 24 y.o. [id number] at [redacted]w[redacted]d who presents to MAU for nausea and vomiting. Pt denies abdominal pain today. Pt reports she last smoked marijuana "a few days ago." Pt has been seen in MAU 8 times since the start of this pregnancy for nausea/vomiting/abdominal pain. Pt was last here 10/24/2019, but prior to this has not been seen in MAU since 10/06/2019. Pt unsure why she felt well during this time period, but reports she may have been smoking less marijuana during that time.  Onset: 3 days ago Location: stomach Duration: 3 days Character: constant nausea, pt reports is unable to eat or drink anything, pt reports vomiting x10 in past 24hrs Aggravating/Associated: none/none Relieving: none Treatment: pt reports she is not currently taking any N/V medication and has not taken any since she left the hospital 10/24/2019. Pt reports she has nausea and vomiting medications at home, but she states that she is told to stop taking her old regimen every time she is seen here and reports she was told to take Diclegis most recently, but she reports she is not taking this.  *Pt was given steroid taper on 10/06/2019, which she reports she was not able to keep down and did not take.  Pt denies VB, LOF, vaginal discharge/odor/itching. Pt denies abdominal pain, constipation, diarrhea, or urinary problems. Pt denies fever, chills, fatigue, sweating or changes in appetite. Pt denies SOB or chest pain. Pt denies dizziness, HA, light-headedness, weakness.  Problems this pregnancy include: sciatica, N/V. Allergies? NKDA Current medications/supplements? Naproxen (pt reports she was prescribed this by her OB, last took 3 days ago), Flexeril (pt last took 3 days ago) Prenatal care provider? [facility], next appt  10/28/2019   OB History    Gravida  5   Para  3   Term  3   Preterm      AB  1   Living  3     SAB  0   TAB  1   Ectopic      Multiple  0   Live Births  3           Past Medical History:  Diagnosis Date  . Anemia   . Anxiety   . Blood transfusion without reported diagnosis   . Chronic back pain   . Depression   . GERD (gastroesophageal reflux disease)   . Infection    UTI  . Post partum depression   . Pyelonephritis   . UTI (lower urinary tract infection)     Past Surgical History:  Procedure Laterality Date  . DILATION AND CURETTAGE OF UTERUS    . INDUCED ABORTION    . WISDOM TOOTH EXTRACTION      Family History  Problem Relation Age of Onset  . Arthritis Mother   . Healthy Father   . Arthritis Maternal Grandmother   . Asthma Son   . Alcohol abuse Neg Hx     Social History   Tobacco Use  . Smoking status: Former Smoker    Quit date: 01/21/2014    Years since quitting: 5.7  . Smokeless tobacco: Never Used  Substance Use Topics  . Alcohol use: Not Currently    Comment: rarely  . Drug use: Yes  Types: Marijuana    Comment: 5 days ago    Allergies: No Known Allergies  Medications Prior to Admission  Medication Sig Dispense Refill Last Dose  . acetaminophen (TYLENOL) 500 MG tablet Take 1 tablet (500 mg total) by mouth every 6 (six) hours as needed for mild pain or moderate pain. 30 tablet 0   . butalbital-acetaminophen-caffeine (FIORICET) 50-325-40 MG tablet Take 1 tablet by mouth every 6 (six) hours as needed for headache. 5 tablet 0   . Doxylamine-Pyridoxine (DICLEGIS) 10-10 MG TBEC Diclegis 10 mg-10 mg tablet,delayed release  TAKE 2 TABLETS BY MOUTH AT BEDTIME     . famotidine (PEPCID) 20 MG tablet famotidine 20 mg tablet  TAKE 1 TABLET BY MOUTH TWICE A DAY     . ferrous sulfate (FERROUSUL) 325 (65 FE) MG tablet Take 1 tablet (325 mg total) by mouth 2 (two) times daily. 60 tablet 1   . hydrocortisone-pramoxine (PROCTOFOAM-HC)  rectal foam Place 1 applicator rectally 2 (two) times daily. 10 g 1   . methylPREDNISolone (MEDROL DOSEPAK) 4 MG TBPK tablet Take as directed 1 each 0   . metoCLOPramide (REGLAN) 10 MG tablet Take 1 tablet (10 mg total) by mouth every 6 (six) hours. 30 tablet 1   . metroNIDAZOLE (METROGEL VAGINAL) 0.75 % vaginal gel Place 1 Applicatorful vaginally at bedtime. Insert one applicator, at bedtime, for 5 nights. 70 g 0   . ondansetron (ZOFRAN-ODT) 4 MG disintegrating tablet ondansetron 4 mg disintegrating tablet  Place 1 tablet twice a day by translingual route as needed.     . pantoprazole (PROTONIX) 20 MG tablet Take 1 tablet (20 mg total) by mouth daily. 30 tablet 2   . Prenatal Vit-Fe Fumarate-FA (PRENATAL MULTIVITAMIN) TABS tablet Take 1 tablet by mouth daily at 12 noon.     . prochlorperazine (COMPAZINE) 10 MG tablet Take 10 mg by mouth every 6 (six) hours as needed.     . prochlorperazine (COMPAZINE) 10 MG tablet prochlorperazine maleate 10 mg tablet  TAKE 1 TABLET (10 MG TOTAL) BY MOUTH EVERY 6 (SIX) HOURS AS NEEDED FOR NAUSEA OR VOMITING.     . promethazine (PHENERGAN) 25 MG suppository promethazine 25 mg rectal suppository  Place 1 suppository (25 mg total) rectally every 8 (eight) hours as needed for nausea or vomiting.     . promethazine (PHENERGAN) 25 MG tablet promethazine 25 mg tablet  TAKE 1 TABLET (25 MG TOTAL) BY MOUTH EVERY 6 (SIX) HOURS AS NEEDED FOR NAUSEA OR VOMITING.     Marland Kitchen pyridOXINE (VITAMIN B-6) 25 MG tablet Vitamin B-6 25 mg tablet  TAKE 1 TABLET (25 MG TOTAL) BY MOUTH EVERY 8 (EIGHT) HOURS.     Marland Kitchen scopolamine (TRANSDERM-SCOP, 1.5 MG,) 1 MG/3DAYS Place 1 patch (1.5 mg total) onto the skin every 3 (three) days for 8 doses. 4 patch 1   . valACYclovir (VALTREX) 1000 MG tablet valacyclovir 1 gram tablet  TAKE 1 TABLET BY MOUTH EVERY DAY     . zolpidem (AMBIEN) 5 MG tablet zolpidem 5 mg tablet  TAKE 1 TABLET BY MOUTH AT BEDTIME AS NEEDED FOR SLEEP WAITING ON PA       Review of  Systems  Constitutional: Negative for chills, diaphoresis, fatigue and fever.  Eyes: Negative for visual disturbance.  Respiratory: Negative for shortness of breath.   Cardiovascular: Negative for chest pain.  Gastrointestinal: Positive for nausea and vomiting. Negative for abdominal pain, constipation and diarrhea.  Genitourinary: Negative for dysuria, flank pain, frequency, pelvic pain, urgency,  vaginal bleeding and vaginal discharge.  Neurological: Negative for dizziness, weakness, light-headedness and headaches.   Physical Exam   Blood pressure 131/71, pulse 73, temperature 97.7 F (36.5 C), temperature source Oral, resp. rate 16, weight 68.5 kg, last menstrual period 07/06/2019, SpO2 100 %.  Patient Vitals for the past 24 hrs:  BP Temp Temp src Pulse Resp SpO2 Weight  10/26/19 1050 131/71 97.7 F (36.5 C) Oral 73 16 100 % -  10/26/19 1049 - - - - - 100 % -  10/26/19 1047 - - - - - - 68.5 kg   Physical Exam  Constitutional: She is oriented to person, place, and time. She appears well-developed and well-nourished. No distress.  HENT:  Head: Normocephalic and atraumatic.  Respiratory: Effort normal.  GI: Soft.  Neurological: She is alert and oriented to person, place, and time.  Skin: Skin is warm and dry. She is not diaphoretic.  Psychiatric: She has a normal mood and affect. Her behavior is normal. Judgment and thought content normal.  FHR 145  Results for orders placed or performed during the hospital encounter of 10/26/19 (from the past 24 hour(s))  Urinalysis, Routine w reflex microscopic     Status: Abnormal   Collection Time: 10/26/19 10:48 AM  Result Value Ref Range   Color, Urine YELLOW YELLOW   APPearance HAZY (A) CLEAR   Specific Gravity, Urine 1.029 1.005 - 1.030   pH 6.0 5.0 - 8.0   Glucose, UA NEGATIVE NEGATIVE mg/dL   Hgb urine dipstick NEGATIVE NEGATIVE   Bilirubin Urine NEGATIVE NEGATIVE   Ketones, ur 80 (A) NEGATIVE mg/dL   Protein, ur 100 (A) NEGATIVE  mg/dL   Nitrite NEGATIVE NEGATIVE   Leukocytes,Ua NEGATIVE NEGATIVE   RBC / HPF 0-5 0 - 5 RBC/hpf   WBC, UA 6-10 0 - 5 WBC/hpf   Bacteria, UA RARE (A) NONE SEEN   Squamous Epithelial / LPF 6-10 0 - 5   Mucus PRESENT   CBC with Differential/Platelet     Status: Abnormal   Collection Time: 10/26/19 12:55 PM  Result Value Ref Range   WBC 8.9 4.0 - 10.5 K/uL   RBC 4.09 3.87 - 5.11 MIL/uL   Hemoglobin 11.2 (L) 12.0 - 15.0 g/dL   HCT 33.6 (L) 36.0 - 46.0 %   MCV 82.2 80.0 - 100.0 fL   MCH 27.4 26.0 - 34.0 pg   MCHC 33.3 30.0 - 36.0 g/dL   RDW 17.7 (H) 11.5 - 15.5 %   Platelets 351 150 - 400 K/uL   nRBC 0.0 0.0 - 0.2 %   Neutrophils Relative % 80 %   Neutro Abs 7.1 1.7 - 7.7 K/uL   Lymphocytes Relative 12 %   Lymphs Abs 1.1 0.7 - 4.0 K/uL   Monocytes Relative 8 %   Monocytes Absolute 0.7 0.1 - 1.0 K/uL   Eosinophils Relative 0 %   Eosinophils Absolute 0.0 0.0 - 0.5 K/uL   Basophils Relative 0 %   Basophils Absolute 0.0 0.0 - 0.1 K/uL   Immature Granulocytes 0 %   Abs Immature Granulocytes 0.02 0.00 - 0.07 K/uL  Comprehensive metabolic panel     Status: Abnormal   Collection Time: 10/26/19 12:55 PM  Result Value Ref Range   Sodium 135 135 - 145 mmol/L   Potassium 3.1 (L) 3.5 - 5.1 mmol/L   Chloride 106 98 - 111 mmol/L   CO2 20 (L) 22 - 32 mmol/L   Glucose, Bld 86 70 - 99 mg/dL  BUN 8 6 - 20 mg/dL   Creatinine, Ser 5.85 0.44 - 1.00 mg/dL   Calcium 9.0 8.9 - 92.9 mg/dL   Total Protein 7.1 6.5 - 8.1 g/dL   Albumin 3.5 3.5 - 5.0 g/dL   AST 12 (L) 15 - 41 U/L   ALT 15 0 - 44 U/L   Alkaline Phosphatase 44 38 - 126 U/L   Total Bilirubin 0.6 0.3 - 1.2 mg/dL   GFR calc non Af Amer >60 >60 mL/min   GFR calc Af Amer >60 >60 mL/min   Anion gap 9 5 - 15   No results found.  MAU Course  Procedures  MDM -N/V in pregnancy with known, chronic marijuana use -MAU visit x8, pt not taking any N/V medications she has been prescribed, continuing to smoke marijuana -UA: hazy/80  ketones/100PRO/rare bacteria, sending urine for culture -CBC w/Diff: WNL -CMP: K 3.1 -1L LR + 40mg  Protonix + 8mg  Zofran + scopolamine patch given -pt able to urinate after one bag of fluids -PO challenge successful with water, patient offered crackers but states she will not eat them because she only wants applesauce right now -patient has not vomited at all since arriving to MAU -pt discharged to home in stable condition  Orders Placed This Encounter  Procedures  . Culture, OB Urine    Standing Status:   Standing    Number of Occurrences:   1  . Urinalysis, Routine w reflex microscopic    Standing Status:   Standing    Number of Occurrences:   1  . CBC with Differential/Platelet    Standing Status:   Standing    Number of Occurrences:   1  . Comprehensive metabolic panel    Standing Status:   Standing    Number of Occurrences:   1  . Insert peripheral IV    Standing Status:   Standing    Number of Occurrences:   1  . Discharge patient    Order Specific Question:   Discharge disposition    Answer:   01-Home or Self Care [1]    Order Specific Question:   Discharge patient date    Answer:   10/26/2019   Meds ordered this encounter  Medications  . ondansetron (ZOFRAN) 8 mg in sodium chloride 0.9 % 50 mL IVPB  . pantoprazole (PROTONIX) injection 40 mg  . scopolamine (TRANSDERM-SCOP) 1 MG/3DAYS 1.5 mg  . lactated ringers bolus 1,000 mL  . scopolamine (TRANSDERM-SCOP, 1.5 MG,) 1 MG/3DAYS    Sig: Place 1 patch (1.5 mg total) onto the skin every 3 (three) days for 8 doses.    Dispense:  4 patch    Refill:  1    Order Specific Question:   Supervising Provider    Answer:   Reva Bores [[id number]]  . ondansetron (ZOFRAN ODT) 8 MG disintegrating tablet    Sig: Take 1 tablet (8 mg total) by mouth every 8 (eight) hours as needed for up to 7 days for nausea or vomiting.    Dispense:  21 tablet    Refill:  0    Order Specific Question:   Supervising Provider    Answer:   Reva Bores  [[id number]]   Assessment and Plan   1. Nausea and vomiting in pregnancy   2. Cannabis hyperemesis syndrome concurrent with and due to cannabis abuse (HCC)   3. [redacted] weeks gestation of pregnancy   4. Hypokalemia    Allergies as of 10/26/2019   No  Known Allergies     Medication List    STOP taking these medications   Diclegis 10-10 MG Tbec Generic drug: Doxylamine-Pyridoxine   methylPREDNISolone 4 MG Tbpk tablet Commonly known as: MEDROL DOSEPAK   metoCLOPramide 10 MG tablet Commonly known as: REGLAN   prochlorperazine 10 MG tablet Commonly known as: COMPAZINE   promethazine 25 MG suppository Commonly known as: PHENERGAN   promethazine 25 MG tablet Commonly known as: PHENERGAN     TAKE these medications   acetaminophen 500 MG tablet Commonly known as: TYLENOL Take 1 tablet (500 mg total) by mouth every 6 (six) hours as needed for mild pain or moderate pain.   butalbital-acetaminophen-caffeine 50-325-40 MG tablet Commonly known as: FIORICET Take 1 tablet by mouth every 6 (six) hours as needed for headache.   famotidine 20 MG tablet Commonly known as: PEPCID famotidine 20 mg tablet  TAKE 1 TABLET BY MOUTH TWICE A DAY   ferrous sulfate 325 (65 FE) MG tablet Commonly known as: FerrouSul Take 1 tablet (325 mg total) by mouth 2 (two) times daily.   hydrocortisone-pramoxine rectal foam Commonly known as: PROCTOFOAM-HC Place 1 applicator rectally 2 (two) times daily.   metroNIDAZOLE 0.75 % vaginal gel Commonly known as: METROGEL VAGINAL Place 1 Applicatorful vaginally at bedtime. Insert one applicator, at bedtime, for 5 nights.   ondansetron 8 MG disintegrating tablet Commonly known as: Zofran ODT Take 1 tablet (8 mg total) by mouth every 8 (eight) hours as needed for up to 7 days for nausea or vomiting. What changed:   medication strength  See the new instructions.   pantoprazole 20 MG tablet Commonly known as: PROTONIX Take 1 tablet (20 mg total) by mouth  daily.   prenatal multivitamin Tabs tablet Take 1 tablet by mouth daily at 12 noon.   pyridOXINE 25 MG tablet Commonly known as: VITAMIN B-6 Vitamin B-6 25 mg tablet  TAKE 1 TABLET (25 MG TOTAL) BY MOUTH EVERY 8 (EIGHT) HOURS.   scopolamine 1 MG/3DAYS Commonly known as: Transderm-Scop (1.5 MG) Place 1 patch (1.5 mg total) onto the skin every 3 (three) days for 8 doses.   valACYclovir 1000 MG tablet Commonly known as: VALTREX valacyclovir 1 gram tablet  TAKE 1 TABLET BY MOUTH EVERY DAY   zolpidem 5 MG tablet Commonly known as: AMBIEN zolpidem 5 mg tablet  TAKE 1 TABLET BY MOUTH AT BEDTIME AS NEEDED FOR SLEEP WAITING ON PA       -will call with culture results, if positive -pt advised not to restart smoking marijuana for N/V or other reason as this can worsen symptoms of N/V, information given on cannabis hyperemesis, discussed possible effects on fetal development -safe meds in pregnancy list given -pt advised to take medications around the clock and not to stop taking if feeling better, discussed timing for each individual medication and explicit instructions on how to use given -pt reports she has Pepcid and Protonix at home and does not need a refill of these medications. Patient advised to use one of those medications daily, but not both. -work note given for today; patient requested work note for tomorrow per her OB, but patient advised we do not give work notes for days that the patient was not seen in MAU -discussed nonpharmacologic and pharmacologic treatments of N/V -discussed normal expectations for N/V in pregnancy -pt discharged to home in stable condition  Melanie Hancock 10/26/2019, 3:54 PM

## 2019-10-26 NOTE — MAU Note (Signed)
Pt states she has been feeling weak and achy all over for the last three days with nausea and vomiting. Reports "way over 5" occurrences of vomiting gin the last 24 hours. Denies LOF/VB, reports some discharge and states she was told she had BV the last time she was here but has not taken any medication for it and is unsure if any was prescribed.

## 2019-10-26 NOTE — Discharge Instructions (Signed)
Hyperemesis Gravidarum Hyperemesis gravidarum is a severe form of nausea and vomiting that happens during pregnancy. Hyperemesis is worse than morning sickness. It may cause you to have nausea or vomiting all day for many days. It may keep you from eating and drinking enough food and liquids, which can lead to dehydration, malnutrition, and weight loss. Hyperemesis usually occurs during the first half (the first 20 weeks) of pregnancy. It often goes away once a woman is in her second half of pregnancy. However, sometimes hyperemesis continues through an entire pregnancy. What are the causes? The cause of this condition is not known. It may be related to changes in chemicals (hormones) in the body during pregnancy, such as the high level of pregnancy hormone (human chorionic gonadotropin) or the increase in the female sex hormone (estrogen). What are the signs or symptoms? Symptoms of this condition include:  Nausea that does not go away.  Vomiting that does not allow you to keep any food down.  Weight loss.  Body fluid loss (dehydration).  Having no desire to eat, or not liking food that you have previously enjoyed. How is this diagnosed? This condition may be diagnosed based on:  A physical exam.  Your medical history.  Your symptoms.  Blood tests.  Urine tests. How is this treated? This condition is managed by controlling symptoms. This may include:  Following an eating plan. This can help lessen nausea and vomiting.  Taking prescription medicines. An eating plan and medicines are often used together to help control symptoms. If medicines do not help relieve nausea and vomiting, you may need to receive fluids through an IV at the hospital. Follow these instructions at home: Eating and drinking   Avoid the following: ? Drinking fluids with meals. Try not to drink anything during the 30 minutes before and after your meals. ? Drinking more than 1 cup of fluid at a  time. ? Eating foods that trigger your symptoms. These may include spicy foods, coffee, high-fat foods, very sweet foods, and acidic foods. ? Skipping meals. Nausea can be more intense on an empty stomach. If you cannot tolerate food, do not force it. Try sucking on ice chips or other frozen items and make up for missed calories later. ? Lying down within 2 hours after eating. ? Being exposed to environmental triggers. These may include food smells, smoky rooms, closed spaces, rooms with strong smells, warm or humid places, overly loud and noisy rooms, and rooms with motion or flickering lights. Try eating meals in a well-ventilated area that is free of strong smells. ? Quick and sudden changes in your movement. ? Taking iron pills and multivitamins that contain iron. If you take prescription iron pills, do not stop taking them unless your health care provider approves. ? Preparing food. The smell of food can spoil your appetite or trigger nausea.  To help relieve your symptoms: ? Listen to your body. Everyone is different and has different preferences. Find what works best for you. ? Eat and drink slowly. ? Eat 5-6 small meals daily instead of 3 large meals. Eating small meals and snacks can help you avoid an empty stomach. ? In the morning, before getting out of bed, eat a couple of crackers to avoid moving around on an empty stomach. ? Try eating starchy foods as these are usually tolerated well. Examples include cereal, toast, bread, potatoes, pasta, rice, and pretzels. ? Include at least 1 serving of protein with your meals and snacks. Protein options include   lean meats, poultry, seafood, beans, nuts, nut butters, eggs, cheese, and yogurt. ? Try eating a protein-rich snack before bed. Examples of a protein-rick snack include cheese and crackers or a peanut butter sandwich made with 1 slice of whole-wheat bread and 1 tsp (5 g) of peanut butter. ? Eat or suck on things that have ginger in them.  It may help relieve nausea. Add  tsp ground ginger to hot tea or choose ginger tea. ? Try drinking 100% fruit juice or an electrolyte drink. An electrolyte drink contains sodium, potassium, and chloride. ? Drink fluids that are cold, clear, and carbonated or sour. Examples include lemonade, ginger ale, lemon-lime soda, ice water, and sparkling water. ? Brush your teeth or use a mouth rinse after meals. ? Talk with your health care provider about starting a supplement of vitamin B6. General instructions  Take over-the-counter and prescription medicines only as told by your health care provider.  Follow instructions from your health care provider about eating or drinking restrictions.  Continue to take your prenatal vitamins as told by your health care provider. If you are having trouble taking your prenatal vitamins, talk with your health care provider about different options.  Keep all follow-up and pre-birth (prenatal) visits as told by your health care provider. This is important. Contact a health care provider if:  You have pain in your abdomen.  You have a severe headache.  You have vision problems.  You are losing weight.  You feel weak or dizzy. Get help right away if:  You cannot drink fluids without vomiting.  You vomit blood.  You have constant nausea and vomiting.  You are very weak.  You faint.  You have a fever and your symptoms suddenly get worse. Summary  Hyperemesis gravidarum is a severe form of nausea and vomiting that happens during pregnancy.  Making some changes to your eating habits may help relieve nausea and vomiting.  This condition may be managed with medicine.  If medicines do not help relieve nausea and vomiting, you may need to receive fluids through an IV at the hospital. This information is not intended to replace advice given to you by your health care provider. Make sure you discuss any questions you have with your health care  provider. Document Revised: 08/26/2017 Document Reviewed: 04/04/2016 Elsevier Patient Education  [place]. Morning Sickness  Morning sickness is when a woman feels nauseous during pregnancy. This nauseous feeling may or may not come with vomiting. It often occurs in the morning, but it can be a problem at any time of day. Morning sickness is most common during the first trimester. In some cases, it may continue throughout pregnancy. Although morning sickness is unpleasant, it is usually harmless unless the woman develops severe and continual vomiting (hyperemesis gravidarum), a condition that requires more intense treatment. What are the causes? The exact cause of this condition is not known, but it seems to be related to normal hormonal changes that occur in pregnancy. What increases the risk? You are more likely to develop this condition if:  You experienced nausea or vomiting before your pregnancy.  You had morning sickness during a previous pregnancy.  You are pregnant with more than one baby, such as twins. What are the signs or symptoms? Symptoms of this condition include:  Nausea.  Vomiting. How is this diagnosed? This condition is usually diagnosed based on your signs and symptoms. How is this treated? In many cases, treatment is not needed for this condition.  Making some changes to what you eat may help to control symptoms. Your health care provider may also prescribe or recommend:  Vitamin B6 supplements.  Anti-nausea medicines.  Ginger. Follow these instructions at home: Medicines  Take over-the-counter and prescription medicines only as told by your health care provider. Do not use any prescription, over-the-counter, or herbal medicines for morning sickness without first talking with your health care provider.  Taking multivitamins before getting pregnant can prevent or decrease the severity of morning sickness in most women. Eating and drinking  Eat a  piece of dry toast or crackers before getting out of bed in the morning.  Eat 5 or 6 small meals a day.  Eat dry and bland foods, such as rice or a baked potato. Foods that are high in carbohydrates are often helpful.  Avoid greasy, fatty, and spicy foods.  Have someone cook for you if the smell of any food causes nausea and vomiting.  If you feel nauseous after taking prenatal vitamins, take the vitamins at night or with a snack.  Snack on protein foods between meals if you are hungry. Nuts, yogurt, and cheese are good options.  Drink fluids throughout the day.  Try ginger ale made with real ginger, ginger tea made from fresh grated ginger, or ginger candies. General instructions  Do not use any products that contain nicotine or tobacco, such as cigarettes and e-cigarettes. If you need help quitting, ask your health care provider.  Get an air purifier to keep the air in your house free of odors.  Get plenty of fresh air.  Try to avoid odors that trigger your nausea.  Consider trying these methods to help relieve symptoms: ? Wearing an acupressure wristband. These wristbands are often worn for seasickness. ? Acupuncture. Contact a health care provider if:  Your home remedies are not working and you need medicine.  You feel dizzy or light-headed.  You are losing weight. Get help right away if:  You have persistent and uncontrolled nausea and vomiting.  You faint.  You have severe pain in your abdomen. Summary  Morning sickness is when a woman feels nauseous during pregnancy. This nauseous feeling may or may not come with vomiting.  Morning sickness is most common during the first trimester.  It often occurs in the morning, but it can be a problem at any time of day.  In many cases, treatment is not needed for this condition. Making some changes to what you eat may help to control symptoms. This information is not intended to replace advice given to you by your  health care provider. Make sure you discuss any questions you have with your health care provider. Document Revised: 07/19/2017 Document Reviewed: 09/08/2016 Elsevier Patient Education  2020 [employer]. Marijuana Use During Pregnancy and Breastfeeding  Marijuana is the dried leaves, flowers, and stems of the Cannabis sativa or Cannabis indica plant. The plant's active ingredients (cannabinoids), including a chemical called THC, change the chemistry of the brain. Marijuana smoke also has many of the same chemicals as cigarette smoke that cause breathing problems. Marijuana gets into your blood through your lungs when you smoke it and through your digestive system when you swallow it. Using marijuana in any form may be harmful for you and your baby when you are trying to become pregnant and during pregnancy. This includes marijuana that is prescribed to you by a health care provider (medical marijuana). Once marijuana is in your blood, it can travel through your placenta to  your baby. It may also pass through breast milk. How does this affect me? Marijuana affects you both mentally and physically. Using marijuana can make you feel high and relaxed. It can also have negative effects, especially at high doses or with long-term use. These include:  Rapid heartbeat and stress on your heart.  Lung irritation and breathing problems.  Difficulty thinking and making decisions.  Seeing or believing things that are not true (hallucinations and paranoia).  Mood swings, depression, or anxiety.  Decreased ability to learn and remember.  Difficulty getting pregnant. Marijuana can also affect your pregnancy. Not all the effects are known. However, if you use marijuana during pregnancy, you may:  Be less likely to get regular prenatal care and do the things that you need to do to have a healthy pregnancy.  Be more likely to use other drugs that can harm your pregnancy, like drinking alcohol and smoking  cigarettes.  Be at higher risk of having your baby die after 28 weeks of pregnancy (stillbirth).  Be at higher risk of giving birth before 37 weeks of pregnancy (premature birth). How does this affect my baby? If you use marijuana during pregnancy, this may affect your baby's development, birth, and life after birth. Your baby may:  Be born prematurely, which can cause physical and mental problems.  Be born with a low birth weight, which can lead to physical and mental problems.  Have problems with brain development.  Have difficulty growing.  Have attention and behavior problems later in life.  Do poorly at school and have learning problems later in life.  Have problems with vision and coordination.  Be at higher risk for using marijuana by age 67. More research is needed to find out exactly how marijuana affects a baby during breastfeeding. Some studies suggest that the chemicals in marijuana can be passed to a baby through breast milk. To limit possible risks, you should not use marijuana during breastfeeding. Follow these instructions at home:  Let your health care provider know if you use marijuana before trying to get pregnant, during pregnancy, or during breastfeeding.  Do not use marijuana in any form when you are trying to get pregnant, when you are pregnant, or when you are breastfeeding. If you are having trouble stopping marijuana use, ask your health care provider for help.  Do not smoke. If you need help quitting, ask your health care provider for help.  If you are using medical marijuana, ask your health care provider to switch you to a medicine that is safer to use during pregnancy or breastfeeding.  Keep all your prenatal visits as told by your health care provider. This is important. Where to find more information [employer] on Drug Abuse: www.drugabuse.gov March of Dimes: www.marchofdimes.org/pregnancy Contact a health care provider if:  You use  marijuana and want to get pregnant.  You use marijuana during pregnancy or breastfeeding.  You need help stopping marijuana use. Get help right away if:  Your baby is not gaining weight or growing as expected. Summary  Using marijuana in any form may be harmful for you and your baby when you are trying to become pregnant, during pregnancy, and during breastfeeding. This includes marijuana that is prescribed to you (medical marijuana).  Some studies suggest that marijuana may pass through breast milk and can affect your baby's brain development.  Talk to your health care provider if you use marijuana in any form while trying to get pregnant, during pregnancy, or while breastfeeding.  Ask your health care provider for help if you are not able to stop using marijuana. This information is not intended to replace advice given to you by your health care provider. Make sure you discuss any questions you have with your health care provider. Document Revised: 11/28/2018 Document Reviewed: 04/24/2017 Elsevier Patient Education  2020 [employer] Cannabinoid Hyperemesis Syndrome Cannabinoid hyperemesis syndrome (CHS) is a condition that causes repeated nausea, vomiting, and abdominal pain after long-term (chronic) use of marijuana (cannabis). People with CHS typically use marijuana 3-5 times a day for many years before they have symptoms, although it is possible to develop CHS with as little as 1 use per day. Symptoms of CHS may be mild at first but can get worse and more frequent. In some cases, CHS may cause vomiting many times a day, which can lead to weight loss and dehydration. CHS may go away and come back many times (recur). People may not have symptoms or may otherwise be healthy in between [facility] attacks. What are the causes? The exact cause of this condition is not known. Long-term use of marijuana may over-stimulate certain proteins in the brain that react with chemicals in marijuana  (cannabinoid receptors). This over-stimulation may cause CHS. What are the signs or symptoms? Symptoms of this condition are often mild during the first few attacks, but they can get worse over time. Symptoms may include:  Frequent nausea, especially early in the morning.  Vomiting.  Abdominal pain. Taking several hot showers throughout the day can also be a sign of this condition. People with CHS may do this because it relieves symptoms. How is this diagnosed? This condition may be diagnosed based on:  Your symptoms and medical history, including any drug use.  A physical exam. You may have tests done to rule out other problems. These tests may include:  Blood tests.  Urine tests.  Imaging tests, such as an X-ray or CT scan. How is this treated? Treatment for this condition involves stopping marijuana use. Your health care provider may recommend:  A drug rehabilitation program, if you have trouble stopping marijuana use.  Medicines for nausea.  Hot showers to help relieve symptoms. Certain creams that contain a substance called capsaicin may improve symptoms when applied to the abdomen. Ask your health care provider before starting any medicines or other treatments. Severe nausea and vomiting may require you to stay at the hospital. You may need IV fluids to prevent or treat dehydration. You may also need certain medicines that must be given at the hospital. Follow these instructions at home: During an attack   Stay in bed and rest in a dark, quiet room.  Take anti-nausea medicine as told by your health care provider.  Try taking hot showers to relieve your symptoms. After an attack  Drink small amounts of clear fluids slowly. Gradually add more.  Once you are able to eat without vomiting, eat soft foods in small amounts every 3-4 hours. General instructions   Do not use any products that contain marijuana.If you need help quitting, ask your health care provider for  resources and treatment options.  Drink enough fluid to keep your urine pale yellow. Avoid drinking fluids that have a lot of sugar or caffeine, such as coffee and soda.  Take and apply over-the-counter and prescription medicines only as told by your health care provider. Ask your health care provider before starting any new medicines or treatments.  Keep all follow-up visits as told by your health care  provider. This is important. Contact a health care provider if:  Your symptoms get worse.  You cannot drink fluids without vomiting.  You have pain and trouble swallowing after an attack. Get help right away if:  You cannot stop vomiting.  You have blood in your vomit or your vomit looks like coffee grounds.  You have severe abdominal pain.  You have stools that are bloody or black, or stools that look like tar.  You have symptoms of dehydration, such as: ? Sunken eyes. ? Inability to make tears. ? Cracked lips. ? Dry mouth. ? Decreased urine production. ? Weakness. ? Sleepiness. ? Fainting. Summary  Cannabinoid hyperemesis syndrome (CHS) is a condition that causes repeated nausea, vomiting, and abdominal pain after long-term use of marijuana.  People with CHS typically use marijuana 3-5 times a day for many years before they have symptoms, although it is possible to develop CHS with as little as 1 use per day.  Treatment for this condition involves stopping marijuana use. Hot showers and capsaicin creams may also help relieve symptoms. Ask your health care provider before starting any medicines or other treatments.  Your health care provider may prescribe medicines to help with nausea.  Get help right away if you have signs of dehydration, such as dry mouth, decreased urine production, or weakness. This information is not intended to replace advice given to you by your health care provider. Make sure you discuss any questions you have with your health care  provider. Document Revised: 12/13/2017 Document Reviewed: 11/14/2016 Elsevier Patient Education  2020 [employer].                   Safe Medications in Pregnancy    Acne: Benzoyl Peroxide Salicylic Acid  Backache/Headache: Tylenol: 2 regular strength every 4 hours OR              2 Extra strength every 6 hours  Colds/Coughs/Allergies: Benadryl (alcohol free) 25 mg every 6 hours as needed Breath right strips Claritin Cepacol throat lozenges Chloraseptic throat spray Cold-Eeze- up to three times per day Cough drops, alcohol free Flonase (by prescription only) Guaifenesin Mucinex Robitussin DM (plain only, alcohol free) Saline nasal spray/drops Sudafed (pseudoephedrine) & Actifed ** use only after [redacted] weeks gestation and if you do not have high blood pressure Tylenol Vicks Vaporub Zinc lozenges Zyrtec   Constipation: Colace Ducolax suppositories Fleet enema Glycerin suppositories Metamucil Milk of magnesia Miralax Senokot Smooth move tea  Diarrhea: Kaopectate Imodium A-D  *NO pepto Bismol  Hemorrhoids: Anusol Anusol HC Preparation H Tucks  Indigestion: Tums Maalox Mylanta Zantac  Pepcid  Insomnia: Benadryl (alcohol free) 25mg  every 6 hours as needed Tylenol PM Unisom, no Gelcaps  Leg Cramps: Tums MagGel  Nausea/Vomiting:  Bonine Dramamine Emetrol Ginger extract Sea bands Meclizine  Nausea medication to take during pregnancy:  Unisom (doxylamine succinate 25 mg tablets) Take one tablet daily at bedtime. If symptoms are not adequately controlled, the dose can be increased to a maximum recommended dose of two tablets daily (1/2 tablet in the morning, 1/2 tablet mid-afternoon and one at bedtime). Vitamin B6 100mg  tablets. Take one tablet twice a day (up to 200 mg per day).  Skin Rashes: Aveeno products Benadryl cream or 25mg  every 6 hours as needed Calamine Lotion 1% cortisone cream  Yeast infection: Gyne-lotrimin 7 Monistat  7   **If taking multiple medications, please check labels to avoid duplicating the same active ingredients **take medication as directed on the label ** Do not  exceed 4000 mg of tylenol in 24 hours **Do not take medications that contain aspirin or ibuprofen

## 2019-10-26 NOTE — Progress Notes (Signed)
Called pharmacy, still not received Zofran. Stated that they would remix it and send it up.

## 2019-10-27 LAB — CULTURE, OB URINE: Culture: NO GROWTH

## 2019-11-06 ENCOUNTER — Other Ambulatory Visit (HOSPITAL_COMMUNITY): Payer: Self-pay | Admitting: Obstetrics and Gynecology

## 2019-11-06 DIAGNOSIS — Z3A19 19 weeks gestation of pregnancy: Secondary | ICD-10-CM

## 2019-11-06 DIAGNOSIS — Z3689 Encounter for other specified antenatal screening: Secondary | ICD-10-CM

## 2019-11-12 ENCOUNTER — Telehealth: Payer: Medicaid Other

## 2019-11-12 ENCOUNTER — Ambulatory Visit (INDEPENDENT_AMBULATORY_CARE_PROVIDER_SITE_OTHER)
Admission: RE | Admit: 2019-11-12 | Discharge: 2019-11-12 | Disposition: A | Discharge status: Home / Self Care | Payer: Medicaid Other | Source: Ambulatory Visit

## 2019-11-12 ENCOUNTER — Other Ambulatory Visit: Payer: Self-pay

## 2019-11-12 DIAGNOSIS — Z3A18 18 weeks gestation of pregnancy: Secondary | ICD-10-CM

## 2019-11-12 DIAGNOSIS — G43709 Chronic migraine without aura, not intractable, without status migrainosus: Secondary | ICD-10-CM | POA: Diagnosis not present

## 2019-11-12 DIAGNOSIS — IMO0002 Reserved for concepts with insufficient information to code with codable children: Secondary | ICD-10-CM

## 2019-11-12 MED ORDER — METOCLOPRAMIDE HCL 10 MG PO TABS: 10.0000 mg | ORAL_TABLET | Freq: Four times a day (QID) | ORAL | 0 refills | Status: DC

## 2019-11-12 MED ORDER — ACETAMINOPHEN 500 MG PO TABS: 1000.0000 mg | ORAL_TABLET | Freq: Four times a day (QID) | ORAL | 0 refills | Status: DC | PRN

## 2019-11-12 NOTE — ED Provider Notes (Signed)
Melanie Hancock    Virtual Visit via Video Note:  Melanie Hancock  initiated request for Telemedicine visit with [facility] team. I connected with Melanie Hancock  on 11/12/2019 at 3:13 PM  for a synchronized telemedicine visit using a video enabled HIPPA compliant telemedicine application. I verified that I am speaking with Melanie Hancock  using two identifiers. [place], PA-C  was physically located in a [facility] site and Melanie Hancock was located at a different location.   The limitations of evaluation and management by telemedicine as well as the availability of in-person appointments were discussed. Patient was informed that she  may incur a bill ( including co-pay) for this virtual visit encounter. Melanie Hancock  expressed understanding and gave verbal consent to proceed with virtual visit.  [id number] 11/12/19 Arrival Time: 1453  Cc: HA  SUBJECTIVE:  Melanie Hancock is a 24 y.o. female [redacted] weeks pregnant,complains of migraine x 3 days.   Denies a precipitating event, or recent head trauma.  Patient localizes her pain to the temples, head and neck.  Describes the pain as intermittent and throbbing/ achy in character.  Patient has tried OTC tylenol without relief. Symptoms are made worse during the day.  Reports similar symptoms in the past that improved with tylenol-caffiene-Butalbital prescription.  This is not the worst headache of their life. Feels similar to past headaches.   Complains of associated nausea, and an episode of lightheadedness on Tuesday after standing from a seated position.  Patient denies fever, chills, vomiting, chest pain, SOB, abdominal pain, weakness, numbness or tingling, slurred speech.     Past Medical History:  Diagnosis Date  . Anemia   . Anxiety   . Blood transfusion without reported diagnosis   . Chronic back pain   . Depression   . GERD (gastroesophageal reflux disease)   . Infection    UTI  . Post partum  depression   . Pyelonephritis   . UTI (lower urinary tract infection)    Past Surgical History:  Procedure Laterality Date  . DILATION AND CURETTAGE OF UTERUS    . INDUCED ABORTION    . WISDOM TOOTH EXTRACTION     No Known Allergies No current facility-administered medications on file prior to encounter.   Current Outpatient Medications on File Prior to Encounter  Medication Sig Dispense Refill  . famotidine (PEPCID) 20 MG tablet famotidine 20 mg tablet  TAKE 1 TABLET BY MOUTH TWICE A DAY    . ferrous sulfate (FERROUSUL) 325 (65 FE) MG tablet Take 1 tablet (325 mg total) by mouth 2 (two) times daily. 60 tablet 1  . hydrocortisone-pramoxine (PROCTOFOAM-HC) rectal foam Place 1 applicator rectally 2 (two) times daily. 10 g 1  . Prenatal Vit-Fe Fumarate-FA (PRENATAL MULTIVITAMIN) TABS tablet Take 1 tablet by mouth daily at 12 noon.    Marland Kitchen scopolamine (TRANSDERM-SCOP, 1.5 MG,) 1 MG/3DAYS Place 1 patch (1.5 mg total) onto the skin every 3 (three) days for 8 doses. 4 patch 1  . valACYclovir (VALTREX) 1000 MG tablet valacyclovir 1 gram tablet  TAKE 1 TABLET BY MOUTH EVERY DAY    . zolpidem (AMBIEN) 5 MG tablet zolpidem 5 mg tablet  TAKE 1 TABLET BY MOUTH AT BEDTIME AS NEEDED FOR SLEEP WAITING ON PA    . [DISCONTINUED] pantoprazole (PROTONIX) 20 MG tablet Take 1 tablet (20 mg total) by mouth daily. 30 tablet 2      OBJECTIVE:  There were  no vitals filed for this visit.   General appearance: alert; no distress Eyes: EOMI grossly HENT: normocephalic; atraumatic Neck: supple with FROM Lungs: normal respiratory effort; speaking in full sentences without difficulty Extremities: moves extremities without difficulty Skin: No obvious rashes Neurologic: No facial asymmetries Psychological: alert and cooperative; normal mood and affect   ASSESSMENT & PLAN:  1. Chronic migraine   2. [redacted] weeks gestation of pregnancy     Meds ordered this encounter  Medications  . acetaminophen (TYLENOL)  500 MG tablet    Sig: Take 2 tablets (1,000 mg total) by mouth every 6 (six) hours as needed.    Dispense:  20 tablet    Refill:  0    Order Specific Question:   Supervising Provider    Answer:   Eustace Moore [[id number]]  . metoCLOPramide (REGLAN) 10 MG tablet    Sig: Take 1 tablet (10 mg total) by mouth every 6 (six) hours.    Dispense:  20 tablet    Refill:  0    Order Specific Question:   Supervising Provider    Answer:   Eustace Moore [[id number]]    No orders of the defined types were placed in this encounter.   OB recommended patient be evaluated in person to have BP checked.  Patient declines at this time would like to try outpatient therapy first.  Will go in person to be evaluated either at Urgent Care or in the ED if symptoms do not improve.  Aware of risk associated with decision such as missed diagnosis, organ damage/ failure, and/or death..   Due to pregnancy will try 1000 mg of acetaminophen and metoclopramide Follow up in person or go to the ED if symptoms do not improve or worsen Follow up with OB as needed Call 911 or go to the ED if you have any new or worsening symptoms such as fever, chills, nausea, vomiting, worsening headache that does not improve with medication, numbness, tingling, chest pain, etc...  I discussed the assessment and treatment plan with the patient. The patient was provided an opportunity to ask questions and all were answered. The patient agreed with the plan and demonstrated an understanding of the instructions.   The patient was advised to call back or seek an in-person evaluation if the symptoms worsen or if the condition fails to improve as anticipated.  I provided 15 minutes of non-face-to-face time during this encounter.  [place], PA-C  11/12/2019 3:13 PM           Rennis Harding, PA-C 11/12/19 1515

## 2019-11-12 NOTE — Discharge Instructions (Signed)
Due to pregnancy will try 1000 mg of acetaminophen and metoclopramide Follow up in person or go to the ED if symptoms do not improve or worsen Follow up with OB as needed Call 911 or go to the ED if you have any new or worsening symptoms such as fever, chills, nausea, vomiting, worsening headache that does not improve with medication, numbness, tingling, chest pain, etc..Marland Kitchen

## 2019-11-16 ENCOUNTER — Ambulatory Visit
Admission: EM | Admit: 2019-11-16 | Discharge: 2019-11-16 | Disposition: A | Discharge status: Home / Self Care | Payer: Medicaid Other | Attending: Physician Assistant | Admitting: Physician Assistant

## 2019-11-16 DIAGNOSIS — M25531 Pain in right wrist: Secondary | ICD-10-CM | POA: Diagnosis not present

## 2019-11-16 DIAGNOSIS — Z23 Encounter for immunization: Secondary | ICD-10-CM | POA: Diagnosis not present

## 2019-11-16 DIAGNOSIS — S61214A Laceration without foreign body of right ring finger without damage to nail, initial encounter: Secondary | ICD-10-CM | POA: Diagnosis not present

## 2019-11-16 MED ORDER — TETANUS-DIPHTH-ACELL PERTUSSIS 5-2.5-18.5 LF-MCG/0.5 IM SUSP
0.5000 mL | Freq: Once | INTRAMUSCULAR | Status: AC
  Administered 2019-11-16: 0.5 mL via INTRAMUSCULAR

## 2019-11-16 NOTE — Discharge Instructions (Signed)
Tetanus updated. No need for repair. Keep wound clean and dry. Can clean gently with soap and water. Ice compress to the finger and wrist for swelling. Wrist splint and finger splint as needed. Monitor for spreading redness, warmth, drainage, follow up for reevaluation needed.

## 2019-11-16 NOTE — ED Triage Notes (Signed)
Pt states cut the tip of her rt ring finger last night when trying to open a window.

## 2019-11-16 NOTE — ED Provider Notes (Signed)
[facility]    CSN: [id number] Arrival date & time: 11/16/19  1203      History   Chief Complaint Chief Complaint  Patient presents with  . Finger Injury    HPI Melanie Hancock is a 24 y.o. female.   24 year old female who is [redacted] weeks pregnant comes in for right wrist pain and right ring finger laceration that was sustained <18 hours ago. She was trying to open a window when she sustained the laceration. States hit her wrist trying to open the window. She has swelling and contusion to the wrist due to the injury. Bleeding controlled without pressure. Experiences some numbness/tingling to the distal finger of the laceration. Unknown last tetanus. States has not had any injections during this pregnancy.      Past Medical History:  Diagnosis Date  . Anemia   . Anxiety   . Blood transfusion without reported diagnosis   . Chronic back pain   . Depression   . GERD (gastroesophageal reflux disease)   . Infection    UTI  . Post partum depression   . Pyelonephritis   . UTI (lower urinary tract infection)     Patient Active Problem List   Diagnosis Date Noted  . Vaginal delivery 02/04/2016  . History of postpartum depression, currently pregnant 01/23/2016  . Iron deficiency anemia 01/02/2016  . 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 Trichomoniasis 06/17/2015  . Anxiety 03/28/2014  . Chronic back pain 03/28/2014  . Hx pyelonephritis 2014 05/26/2013    Past Surgical History:  Procedure Laterality Date  . DILATION AND CURETTAGE OF UTERUS    . INDUCED ABORTION    . WISDOM TOOTH EXTRACTION      OB History    Gravida  5   Para  3   Term  3   Preterm      AB  1   Living  3     SAB  0   TAB  1   Ectopic      Multiple  0   Live Births  3            Home Medications    Prior to Admission medications   Medication Sig Start Date End Date Taking? Authorizing Provider  acetaminophen (TYLENOL) 500 MG tablet Take 2  tablets (1,000 mg total) by mouth every 6 (six) hours as needed. 11/12/19   Wurst, [place], PA-C  famotidine (PEPCID) 20 MG tablet famotidine 20 mg tablet  TAKE 1 TABLET BY MOUTH TWICE A DAY    [provider]  ferrous sulfate (FERROUSUL) 325 (65 FE) MG tablet Take 1 tablet (325 mg total) by mouth 2 (two) times daily. 08/07/19   Aviva Signs, CNM  hydrocortisone-pramoxine [facility]) rectal foam Place 1 applicator rectally 2 (two) times daily. 09/20/19   Gerrit Heck, CNM  metoCLOPramide (REGLAN) 10 MG tablet Take 1 tablet (10 mg total) by mouth every 6 (six) hours. 11/12/19   Wurst, [place], PA-C  Prenatal Vit-Fe Fumarate-FA (PRENATAL MULTIVITAMIN) TABS tablet Take 1 tablet by mouth daily at 12 noon.    [provider]  scopolamine (TRANSDERM-SCOP, 1.5 MG,) 1 MG/3DAYS Place 1 patch (1.5 mg total) onto the skin every 3 (three) days for 8 doses. 10/26/19 11/17/19  Nugent, Odie Sera, NP  valACYclovir (VALTREX) 1000 MG tablet valacyclovir 1 gram tablet  TAKE 1 TABLET BY MOUTH EVERY DAY    [provider]  zolpidem (AMBIEN) 5 MG  tablet zolpidem 5 mg tablet  TAKE 1 TABLET BY MOUTH AT BEDTIME AS NEEDED FOR SLEEP WAITING ON PA    [provider]  pantoprazole (PROTONIX) 20 MG tablet Take 1 tablet (20 mg total) by mouth daily. 09/17/19 11/12/19  Aviva Signs, CNM    Family History Family History  Problem Relation Age of Onset  . Arthritis Mother   . Healthy Father   . Arthritis Maternal Grandmother   . Asthma Son   . Alcohol abuse Neg Hx     Social History Social History   Tobacco Use  . Smoking status: Former Smoker    Quit date: 01/21/2014    Years since quitting: 5.8  . Smokeless tobacco: Never Used  Substance Use Topics  . Alcohol use: Not Currently    Comment: rarely  . Drug use: Yes    Types: Marijuana    Comment: 5 days ago     Allergies   Patient has no known allergies.   Review of Systems Review of Systems  Reason unable to  perform ROS: See HPI as above.     Physical Exam Triage Vital Signs ED Triage Vitals  Enc Vitals Group     BP 11/16/19 1218 104/65     Pulse Rate 11/16/19 1218 93     Resp 11/16/19 1218 16     Temp 11/16/19 1218 97.9 F (36.6 C)     Temp Source 11/16/19 1218 Oral     SpO2 11/16/19 1218 98 %     Weight --      Height --      Head Circumference --      Peak Flow --      Pain Score 11/16/19 1219 8     Pain Loc --      Pain Edu? --      Excl. in GC? --    No data found.  Updated Vital Signs BP 104/65 (BP Location: Left Arm)   Pulse 93   Temp 97.9 F (36.6 C) (Oral)   Resp 16   LMP 07/06/2019   SpO2 98%   Physical Exam Constitutional:      General: She is not in acute distress.    Appearance: Normal appearance. She is well-developed. She is not toxic-appearing or diaphoretic.  HENT:     Head: Normocephalic and atraumatic.  Eyes:     Conjunctiva/sclera: Conjunctivae normal.     Pupils: Pupils are equal, round, and reactive to light.  Pulmonary:     Effort: Pulmonary effort is normal. No respiratory distress.     Comments: Speaking in full sentences without difficulty Musculoskeletal:     Cervical back: Normal range of motion and neck supple.     Comments: Swelling with contusion to the dorsal radial aspect of the distal forearm/wrist. No erythema/warmth. tendernress along contusion. Full ROM of wrist, elbow.   2cm superficial laceration/abrsion to the dorsal PIP/DIP of right ring finger not involving nailbed. Small abrasions to the ventral DIP.  Bleeding controlled without pressure. Full ROM of finger with no opening of the wound. Sensation 3-4/5 of finger pad. Radial pulse 2+, cap refill <2s  Skin:    General: Skin is warm and dry.  Neurological:     Mental Status: She is alert and oriented to person, place, and time.      UC Treatments / Results  Labs (all labs ordered are listed, but only abnormal results are displayed) Labs Reviewed - No data to display   EKG  Radiology No results found.  Procedures Procedures (including critical care time)  Medications Ordered in UC Medications  Tdap (BOOSTRIX) injection 0.5 mL (0.5 mLs Intramuscular Given 11/16/19 1242)    Initial Impression / Assessment and Plan / UC Course  I have reviewed the triage vital signs and the nursing notes.  Pertinent labs & imaging results that were available during my care of the patient were reviewed by me and considered in my medical decision making (see chart for details).    Discussed given laceration superficial, with no opening during ROM, no indication for laceration repair. Wound care instructions given. Will provide symptomatic management with finger and wrist splint. Return precautions given. Patient expresses understanding and agrees to plan.  Final Clinical Impressions(s) / UC Diagnoses   Final diagnoses:  Right wrist pain  Laceration of right ring finger without foreign body without damage to nail, initial encounter   ED Prescriptions    None     PDMP not reviewed this encounter.   Ok Edwards, PA-C 11/16/19 1315

## 2019-11-17 ENCOUNTER — Encounter (HOSPITAL_COMMUNITY): Payer: Self-pay | Admitting: Obstetrics and Gynecology

## 2019-11-17 ENCOUNTER — Inpatient Hospital Stay (HOSPITAL_COMMUNITY)
Admission: AD | Admit: 2019-11-17 | Discharge: 2019-11-17 | Disposition: A | Discharge status: Home / Self Care | Payer: Medicaid Other | Attending: Obstetrics and Gynecology | Admitting: Obstetrics and Gynecology

## 2019-11-17 ENCOUNTER — Inpatient Hospital Stay (HOSPITAL_BASED_OUTPATIENT_CLINIC_OR_DEPARTMENT_OTHER): Payer: Medicaid Other

## 2019-11-17 DIAGNOSIS — Z3A19 19 weeks gestation of pregnancy: Secondary | ICD-10-CM

## 2019-11-17 DIAGNOSIS — E162 Hypoglycemia, unspecified: Secondary | ICD-10-CM

## 2019-11-17 DIAGNOSIS — O26892 Other specified pregnancy related conditions, second trimester: Secondary | ICD-10-CM | POA: Diagnosis not present

## 2019-11-17 DIAGNOSIS — R0782 Intercostal pain: Secondary | ICD-10-CM | POA: Diagnosis not present

## 2019-11-17 DIAGNOSIS — R109 Unspecified abdominal pain: Secondary | ICD-10-CM

## 2019-11-17 DIAGNOSIS — R102 Pelvic and perineal pain: Secondary | ICD-10-CM | POA: Diagnosis not present

## 2019-11-17 LAB — WET PREP, GENITAL
Clue Cells Wet Prep HPF POC: NONE SEEN
Sperm: NONE SEEN
Trich, Wet Prep: NONE SEEN
Yeast Wet Prep HPF POC: NONE SEEN

## 2019-11-17 LAB — URINALYSIS, ROUTINE W REFLEX MICROSCOPIC
Bilirubin Urine: NEGATIVE
Glucose, UA: NEGATIVE mg/dL
Hgb urine dipstick: NEGATIVE
Ketones, ur: NEGATIVE mg/dL
Leukocytes,Ua: NEGATIVE
Nitrite: NEGATIVE
Protein, ur: NEGATIVE mg/dL
Specific Gravity, Urine: 1.016 (ref 1.005–1.030)
pH: 8 (ref 5.0–8.0)

## 2019-11-17 LAB — GLUCOSE, CAPILLARY: Glucose-Capillary: 67 mg/dL — ABNORMAL LOW (ref 70–99)

## 2019-11-17 MED ORDER — ACETAMINOPHEN 500 MG PO TABS
1000.0000 mg | ORAL_TABLET | Freq: Once | ORAL | Status: AC
  Administered 2019-11-17: 1000 mg via ORAL
  Filled 2019-11-17: qty 2

## 2019-11-17 MED ORDER — FAMOTIDINE 20 MG PO TABS
20.0000 mg | ORAL_TABLET | Freq: Once | ORAL | Status: AC
  Administered 2019-11-17: 20 mg via ORAL
  Filled 2019-11-17: qty 1

## 2019-11-17 MED ORDER — ONDANSETRON HCL 4 MG PO TABS
8.0000 mg | ORAL_TABLET | Freq: Once | ORAL | Status: AC
  Administered 2019-11-17: 18:00:00 8 mg via ORAL
  Filled 2019-11-17: qty 2

## 2019-11-17 NOTE — MAU Provider Note (Signed)
History     CSN: [id number]  Arrival date and time: 11/17/19 1516   First Provider Initiated Contact with Patient 11/17/19 1624      Chief Complaint  Patient presents with  . Chest Pain   Melanie Hancock is a 24 y.o. [id number] at [redacted]w[redacted]d who receives care at [facility].  She presents today for Chest Pain.  She states she has been experiencing "a tightness" in her chest since Friday.  She states she has had this same type of tightness in the past, but it has been responsive to stretching.  However, patient says the pain is not located directly in the middle of her chest as usual and the pain woke her out of her sleep.  Patient understands that she will require transfer to [facility] for further evaluation.  Patient gives provider ECG from recent EMS visit to her house.   Patient also reports she has been experiencing BH contractions Saturday night that she states was so strong that she had to breathe through them.  Patient states that she has completed her treatment for her BV infection. She reports she has only experienced the symptoms during the night. She reports drinking "more than 4 bottles per day." She reports sexual activity in the past 3 days.  Reports she has episodes of dizziness that causes her to see stars and then darkness that causes her to reach out for support.  Patient states these symptoms started last Tuesday. Patient states that she has been eating "2 meals" a day with snacks in between.  Patient goes on to state that the symptoms have occurred even after "putting something on my stomach."  Patient endorses fetal movement and denies vaginal discharge, bleeding, or leaking. Patient reports sexual activity in the past 72 hours.      OB History    Gravida  5   Para  3   Term  3   Preterm      AB  1   Living  3     SAB  0   TAB  1   Ectopic      Multiple  0   Live Births  3           Past Medical History:  Diagnosis Date  . Anemia   . Anxiety   . Blood  transfusion without reported diagnosis   . Chronic back pain   . Depression   . GERD (gastroesophageal reflux disease)   . Infection    UTI  . Post partum depression   . Pyelonephritis   . UTI (lower urinary tract infection)     Past Surgical History:  Procedure Laterality Date  . DILATION AND CURETTAGE OF UTERUS    . INDUCED ABORTION    . WISDOM TOOTH EXTRACTION      Family History  Problem Relation Age of Onset  . Arthritis Mother   . Healthy Father   . Arthritis Maternal Grandmother   . Asthma Son   . Alcohol abuse Neg Hx     Social History   Tobacco Use  . Smoking status: Former Smoker    Quit date: 01/21/2014    Years since quitting: 5.8  . Smokeless tobacco: Never Used  Substance Use Topics  . Alcohol use: Yes    Comment: rarely  . Drug use: Not Currently    Types: Marijuana    Comment: 2-3 weeks ago    Allergies: No Known Allergies  Medications Prior to Admission  Medication Sig Dispense  Refill Last Dose  . acetaminophen (TYLENOL) 500 MG tablet Take 2 tablets (1,000 mg total) by mouth every 6 (six) hours as needed. 20 tablet 0 11/17/2019 at Unknown time  . famotidine (PEPCID) 20 MG tablet famotidine 20 mg tablet  TAKE 1 TABLET BY MOUTH TWICE A DAY   11/17/2019 at Unknown time  . hydrocortisone-pramoxine (PROCTOFOAM-HC) rectal foam Place 1 applicator rectally 2 (two) times daily. 10 g 1 Past Month at Unknown time  . Prenatal Vit-Fe Fumarate-FA (PRENATAL MULTIVITAMIN) TABS tablet Take 1 tablet by mouth daily at 12 noon.   11/16/2019 at Unknown time  . valACYclovir (VALTREX) 1000 MG tablet valacyclovir 1 gram tablet  TAKE 1 TABLET BY MOUTH EVERY DAY   11/16/2019 at Unknown time  . ferrous sulfate (FERROUSUL) 325 (65 FE) MG tablet Take 1 tablet (325 mg total) by mouth 2 (two) times daily. 60 tablet 1 More than a month at Unknown time  . metoCLOPramide (REGLAN) 10 MG tablet Take 1 tablet (10 mg total) by mouth every 6 (six) hours. 20 tablet 0 More than a month at  Unknown time  . scopolamine (TRANSDERM-SCOP, 1.5 MG,) 1 MG/3DAYS Place 1 patch (1.5 mg total) onto the skin every 3 (three) days for 8 doses. 4 patch 1 More than a month at Unknown time  . zolpidem (AMBIEN) 5 MG tablet zolpidem 5 mg tablet  TAKE 1 TABLET BY MOUTH AT BEDTIME AS NEEDED FOR SLEEP WAITING ON PA   More than a month at Unknown time    Review of Systems  Eyes: Positive for visual disturbance (Seeing Stars).  Gastrointestinal: Negative for abdominal pain, constipation, diarrhea, nausea and vomiting.  Genitourinary: Negative for difficulty urinating, dyspareunia, dysuria, vaginal bleeding and vaginal discharge.  Musculoskeletal: Positive for back pain (Reports h/o Sciatica).  Neurological: Positive for dizziness and headaches. Negative for light-headedness.   Physical Exam   Blood pressure (!) 102/56, pulse 88, temperature 98.3 F (36.8 C), temperature source Oral, resp. rate 18, height 5\' 5"  (1.651 m), weight 71.3 kg, last menstrual period 07/06/2019, SpO2 100 %.  Physical Exam  Constitutional: She is oriented to person, place, and time. She appears well-developed and well-nourished. No distress.  Patient sitting upright in bed, talking on phone upon provider entrance.   HENT:  Head: Normocephalic and atraumatic.  Eyes: Pupils are equal, round, and reactive to light. Conjunctivae are normal.  Cardiovascular: Normal rate and normal heart sounds.  Respiratory: Effort normal and breath sounds normal.  GI: Soft.  Genitourinary:    No vaginal discharge or bleeding.  No bleeding in the vagina.    Genitourinary Comments: Speculum Exam: -Normal External Genitalia: Non tender, no apparent discharge at introitus.  -Vaginal Vault: Pink mucosa with good rugae. Scant amt thin white discharge -wet prep collected -Cervix:Pink, no lesions, cysts, or polyps.  Appears parous. No active bleeding from os-GC/CT collected -Bimanual Exam:  1/Thick/Soft    Musculoskeletal:        General: Normal  range of motion.     Cervical back: Normal range of motion.  Neurological: She is alert and oriented to person, place, and time.  Skin: Skin is warm and dry.  Psychiatric: She has a normal mood and affect. Her behavior is normal.   FHR 144 by Doppler  MAU Course  Procedures Results for orders placed or performed during the hospital encounter of 11/17/19 (from the past 24 hour(s))  Urinalysis, Routine w reflex microscopic     Status: Abnormal   Collection Time: 11/17/19  4:13  PM  Result Value Ref Range   Color, Urine YELLOW YELLOW   APPearance HAZY (A) CLEAR   Specific Gravity, Urine 1.016 1.005 - 1.030   pH 8.0 5.0 - 8.0   Glucose, UA NEGATIVE NEGATIVE mg/dL   Hgb urine dipstick NEGATIVE NEGATIVE   Bilirubin Urine NEGATIVE NEGATIVE   Ketones, ur NEGATIVE NEGATIVE mg/dL   Protein, ur NEGATIVE NEGATIVE mg/dL   Nitrite NEGATIVE NEGATIVE   Leukocytes,Ua NEGATIVE NEGATIVE  Wet prep, genital     Status: Abnormal   Collection Time: 11/17/19  4:49 PM   Specimen: Cervix  Result Value Ref Range   Yeast Wet Prep HPF POC NONE SEEN NONE SEEN   Trich, Wet Prep NONE SEEN NONE SEEN   Clue Cells Wet Prep HPF POC NONE SEEN NONE SEEN   WBC, Wet Prep HPF POC FEW (A) NONE SEEN   Sperm NONE SEEN   Glucose, capillary     Status: Abnormal   Collection Time: 11/17/19  5:01 PM  Result Value Ref Range   Glucose-Capillary 67 (L) 70 - 99 mg/dL            MDM Physical Exam Labs: UA,  Wet Prep, GC/CT Ultrasound Medications Assessment and Plan  24 year old, [id number]  SIUP at 19.1weeks Chest Pain BH Contractions Dizziness  -Reviewed POC with patient. -Exam performed and findings discussed.  -Cultures collected and sent.  -Informed that not abnormal for cervix to have some dilation with history of delivery.  However, concern is for early GA. -EKG reviewed by Dr. Manus Rudd who confirms normal findings.  -Discussed how dizziness can be related to hypoglycemia from inadequate or improper  nutritional intake.  -Will obtain CBG and orthostatic vitals. -Dr. Adrian Blackwater consulted regarding pelvic exam findings.  Advises: *Send for cervical length and reassess.  -CBG 67 and nurse instructed give juice and send for [place].  Cherre Robins 11/17/2019, 4:24 PM   Reassessment (6:16 PM) -Results return and cervical length adequate at 5.15cm -Reviewed CBG and discussed nutritional intake during pregnancy to include 3 meals daily and high protein snacks between meals.  -Patient now c/o nausea and pain. -Patient ordered Zofran, Tylenol, and Pepcid. -Will reassess.     Reassessment (7:23 PM) -Patient reports nausea has subsided. -Also reports chest pain has improved with pepcid dosing. -Patient declines transfer to [facility] and requests discharge. -Patient reports next appt on April 12th. -Dr. Manus Rudd consulted and agrees with discharge and follow up as scheduled.  -Precautions given.  -Instructed to maintain pelvic rest until next appt. -Encouraged to call primary ob or return to MAU if symptoms worsen or with the onset of new symptoms. -Discharged to home in stable condition.  Cherre Robins MSN, CNM Advanced Practice Provider, [facility] [employer] [facility]

## 2019-11-17 NOTE — MAU Note (Addendum)
Been having chest pain the last 3 days. Hurts mid chest, little to left.  Also having Deberah Pelton- usually happens at night, the other night had 4 in about an hour, none currently.  Called OB around 0500, was told to come in.  Called EMS after she talked to OB.  Did EKG and VS, was told to f/u with OB this morning. Called OB back and was told to come in.  Also reports seeing stars and having headaches at times.

## 2019-11-18 LAB — GC/CHLAMYDIA PROBE AMP (~~LOC~~) NOT AT ARMC
Chlamydia: NEGATIVE
Comment: NEGATIVE
Comment: NORMAL
Neisseria Gonorrhea: NEGATIVE

## 2019-11-27 ENCOUNTER — Other Ambulatory Visit: Payer: Self-pay

## 2019-11-27 ENCOUNTER — Other Ambulatory Visit (HOSPITAL_COMMUNITY): Payer: Self-pay | Admitting: Obstetrics and Gynecology

## 2019-11-27 ENCOUNTER — Ambulatory Visit (HOSPITAL_COMMUNITY)
Admission: RE | Admit: 2019-11-27 | Discharge: 2019-11-27 | Disposition: A | Discharge status: Home / Self Care | Payer: Medicaid Other | Source: Ambulatory Visit | Attending: Obstetrics and Gynecology | Admitting: Obstetrics and Gynecology

## 2019-11-27 DIAGNOSIS — Z3A19 19 weeks gestation of pregnancy: Secondary | ICD-10-CM | POA: Insufficient documentation

## 2019-11-27 DIAGNOSIS — O359XX0 Maternal care for (suspected) fetal abnormality and damage, unspecified, not applicable or unspecified: Secondary | ICD-10-CM | POA: Insufficient documentation

## 2019-11-27 DIAGNOSIS — Z3A20 20 weeks gestation of pregnancy: Secondary | ICD-10-CM

## 2019-11-27 DIAGNOSIS — Z363 Encounter for antenatal screening for malformations: Secondary | ICD-10-CM

## 2019-11-27 DIAGNOSIS — Z3689 Encounter for other specified antenatal screening: Secondary | ICD-10-CM

## 2019-11-30 ENCOUNTER — Encounter (HOSPITAL_COMMUNITY): Payer: Self-pay | Admitting: Obstetrics & Gynecology

## 2019-12-16 ENCOUNTER — Encounter (HOSPITAL_COMMUNITY): Payer: Self-pay | Admitting: Obstetrics & Gynecology

## 2019-12-16 ENCOUNTER — Other Ambulatory Visit: Payer: Self-pay

## 2019-12-16 ENCOUNTER — Inpatient Hospital Stay (HOSPITAL_COMMUNITY)
Admission: AD | Admit: 2019-12-16 | Discharge: 2019-12-16 | Disposition: A | Discharge status: Home / Self Care | Payer: Medicaid Other | Attending: Obstetrics & Gynecology | Admitting: Obstetrics & Gynecology

## 2019-12-16 DIAGNOSIS — W01190A Fall on same level from slipping, tripping and stumbling with subsequent striking against furniture, initial encounter: Secondary | ICD-10-CM | POA: Diagnosis not present

## 2019-12-16 DIAGNOSIS — O368120 Decreased fetal movements, second trimester, not applicable or unspecified: Secondary | ICD-10-CM | POA: Insufficient documentation

## 2019-12-16 DIAGNOSIS — Z3A23 23 weeks gestation of pregnancy: Secondary | ICD-10-CM | POA: Insufficient documentation

## 2019-12-16 DIAGNOSIS — Z87891 Personal history of nicotine dependence: Secondary | ICD-10-CM | POA: Insufficient documentation

## 2019-12-16 DIAGNOSIS — W19XXXA Unspecified fall, initial encounter: Secondary | ICD-10-CM | POA: Diagnosis not present

## 2019-12-16 DIAGNOSIS — O26892 Other specified pregnancy related conditions, second trimester: Secondary | ICD-10-CM | POA: Diagnosis present

## 2019-12-16 DIAGNOSIS — R0781 Pleurodynia: Secondary | ICD-10-CM | POA: Diagnosis not present

## 2019-12-16 DIAGNOSIS — N859 Noninflammatory disorder of uterus, unspecified: Secondary | ICD-10-CM

## 2019-12-16 DIAGNOSIS — N858 Other specified noninflammatory disorders of uterus: Secondary | ICD-10-CM

## 2019-12-16 LAB — URINALYSIS, ROUTINE W REFLEX MICROSCOPIC
Bilirubin Urine: NEGATIVE
Glucose, UA: NEGATIVE mg/dL
Hgb urine dipstick: NEGATIVE
Ketones, ur: NEGATIVE mg/dL
Leukocytes,Ua: NEGATIVE
Nitrite: NEGATIVE
Protein, ur: NEGATIVE mg/dL
Specific Gravity, Urine: 1.015 (ref 1.005–1.030)
pH: 8 (ref 5.0–8.0)

## 2019-12-16 LAB — FIBRINOGEN: Fibrinogen: 414 mg/dL (ref 210–475)

## 2019-12-16 NOTE — Discharge Instructions (Signed)
Chest Wall Pain Chest wall pain is pain in or around the bones and muscles of your chest. Chest wall pain may be caused by:  An injury.  Coughing a lot.  Using your chest and arm muscles too much. Sometimes, the cause may not be known. This pain may take a few weeks or longer to get better. Follow these instructions at home: Managing pain, stiffness, and swelling If told, put ice on the painful area:  Put ice in a plastic bag.  Place a towel between your skin and the bag.  Leave the ice on for 20 minutes, 2-3 times a day.  Activity  Rest as told by your doctor.  Avoid doing things that cause pain. This includes lifting heavy items.  Ask your doctor what activities are safe for you. General instructions   Take over-the-counter and prescription medicines only as told by your doctor.  Do not use any products that contain nicotine or tobacco, such as cigarettes, e-cigarettes, and chewing tobacco. If you need help quitting, ask your doctor.  Keep all follow-up visits as told by your doctor. This is important. Contact a doctor if:  You have a fever.  Your chest pain gets worse.  You have new symptoms. Get help right away if:  You feel sick to your stomach (nauseous) or you throw up (vomit).  You feel sweaty or light-headed.  You have a cough with mucus from your lungs (sputum) or you cough up blood.  You are short of breath. These symptoms may be an emergency. Do not wait to see if the symptoms will go away. Get medical help right away. Call your local emergency services (911 in the [place] Do not drive yourself to the hospital. Summary  Chest wall pain is pain in or around the bones and muscles of your chest.  It may be treated with ice, rest, and medicines. Your condition may also get better if you avoid doing things that cause pain.  Contact a doctor if you have a fever, chest pain that gets worse, or new symptoms.  Get help right away if you feel light-headed  or you get short of breath. These symptoms may be an emergency. This information is not intended to replace advice given to you by your health care provider. Make sure you discuss any questions you have with your health care provider. Document Revised: 02/06/2018 Document Reviewed: 02/06/2018 Elsevier Patient Education  [place] of Pregnancy The second trimester is from week 14 through week 27 (months 4 through 6). The second trimester is often a time when you feel your best. Your body has adjusted to being pregnant, and you begin to feel better physically. Usually, morning sickness has lessened or quit completely, you may have more energy, and you may have an increase in appetite. The second trimester is also a time when the fetus is growing rapidly. At the end of the sixth month, the fetus is about 9 inches long and weighs about 1 pounds. You will likely begin to feel the baby move (quickening) between 16 and 20 weeks of pregnancy. Body changes during your second trimester Your body continues to go through many changes during your second trimester. The changes vary from woman to woman.  Your weight will continue to increase. You will notice your lower abdomen bulging out.  You may begin to get stretch marks on your hips, abdomen, and breasts.  You may develop headaches that can be relieved by medicines. The medicines  should be approved by your health care provider.  You may urinate more often because the fetus is pressing on your bladder.  You may develop or continue to have heartburn as a result of your pregnancy.  You may develop constipation because certain hormones are causing the muscles that push waste through your intestines to slow down.  You may develop hemorrhoids or swollen, bulging veins (varicose veins).  You may have back pain. This is caused by: ? Weight gain. ? Pregnancy hormones that are relaxing the joints in your pelvis. ? A shift in  weight and the muscles that support your balance.  Your breasts will continue to grow and they will continue to become tender.  Your gums may bleed and may be sensitive to brushing and flossing.  Dark spots or blotches (chloasma, mask of pregnancy) may develop on your face. This will likely fade after the baby is born.  A dark line from your belly button to the pubic area (linea nigra) may appear. This will likely fade after the baby is born.  You may have changes in your hair. These can include thickening of your hair, rapid growth, and changes in texture. Some women also have hair loss during or after pregnancy, or hair that feels dry or thin. Your hair will most likely return to normal after your baby is born. What to expect at prenatal visits During a routine prenatal visit:  You will be weighed to make sure you and the fetus are growing normally.  Your blood pressure will be taken.  Your abdomen will be measured to track your baby's growth.  The fetal heartbeat will be listened to.  Any test results from the previous visit will be discussed. Your health care provider may ask you:  How you are feeling.  If you are feeling the baby move.  If you have had any abnormal symptoms, such as leaking fluid, bleeding, severe headaches, or abdominal cramping.  If you are using any tobacco products, including cigarettes, chewing tobacco, and electronic cigarettes.  If you have any questions. Other tests that may be performed during your second trimester include:  Blood tests that check for: ? Low iron levels (anemia). ? High blood sugar that affects pregnant women (gestational diabetes) between 84 and 28 weeks. ? Rh antibodies. This is to check for a protein on red blood cells (Rh factor).  Urine tests to check for infections, diabetes, or protein in the urine.  An ultrasound to confirm the proper growth and development of the baby.  An amniocentesis to check for possible genetic  problems.  Fetal screens for spina bifida and Down syndrome.  HIV (human immunodeficiency virus) testing. Routine prenatal testing includes screening for HIV, unless you choose not to have this test. Follow these instructions at home: Medicines  Follow your health care provider's instructions regarding medicine use. Specific medicines may be either safe or unsafe to take during pregnancy.  Take a prenatal vitamin that contains at least 600 micrograms (mcg) of folic acid.  If you develop constipation, try taking a stool softener if your health care provider approves. Eating and drinking   Eat a balanced diet that includes fresh fruits and vegetables, whole grains, good sources of protein such as meat, eggs, or tofu, and low-fat dairy. Your health care provider will help you determine the amount of weight gain that is right for you.  Avoid raw meat and uncooked cheese. These carry germs that can cause birth defects in the baby.  If  you have low calcium intake from food, talk to your health care provider about whether you should take a daily calcium supplement.  Limit foods that are high in fat and processed sugars, such as fried and sweet foods.  To prevent constipation: ? Drink enough fluid to keep your urine clear or pale yellow. ? Eat foods that are high in fiber, such as fresh fruits and vegetables, whole grains, and beans. Activity  Exercise only as directed by your health care provider. Most women can continue their usual exercise routine during pregnancy. Try to exercise for 30 minutes at least 5 days a week. Stop exercising if you experience uterine contractions.  Avoid heavy lifting, wear low heel shoes, and practice good posture.  A sexual relationship may be continued unless your health care provider directs you otherwise. Relieving pain and discomfort  Wear a good support bra to prevent discomfort from breast tenderness.  Take warm sitz baths to soothe any pain or  discomfort caused by hemorrhoids. Use hemorrhoid cream if your health care provider approves.  Rest with your legs elevated if you have leg cramps or low back pain.  If you develop varicose veins, wear support hose. Elevate your feet for 15 minutes, 3-4 times a day. Limit salt in your diet. Prenatal Care  Write down your questions. Take them to your prenatal visits.  Keep all your prenatal visits as told by your health care provider. This is important. Safety  Wear your seat belt at all times when driving.  Make a list of emergency phone numbers, including numbers for family, friends, the hospital, and police and fire departments. General instructions  Ask your health care provider for a referral to a local prenatal education class. Begin classes no later than the beginning of month 6 of your pregnancy.  Ask for help if you have counseling or nutritional needs during pregnancy. Your health care provider can offer advice or refer you to specialists for help with various needs.  Do not use hot tubs, steam rooms, or saunas.  Do not douche or use tampons or scented sanitary pads.  Do not cross your legs for long periods of time.  Avoid cat litter boxes and soil used by cats. These carry germs that can cause birth defects in the baby and possibly loss of the fetus by miscarriage or stillbirth.  Avoid all smoking, herbs, alcohol, and unprescribed drugs. Chemicals in these products can affect the formation and growth of the baby.  Do not use any products that contain nicotine or tobacco, such as cigarettes and e-cigarettes. If you need help quitting, ask your health care provider.  Visit your dentist if you have not gone yet during your pregnancy. Use a soft toothbrush to brush your teeth and be gentle when you floss. Contact a health care provider if:  You have dizziness.  You have mild pelvic cramps, pelvic pressure, or nagging pain in the abdominal area.  You have persistent  nausea, vomiting, or diarrhea.  You have a bad smelling vaginal discharge.  You have pain when you urinate. Get help right away if:  You have a fever.  You are leaking fluid from your vagina.  You have spotting or bleeding from your vagina.  You have severe abdominal cramping or pain.  You have rapid weight gain or weight loss.  You have shortness of breath with chest pain.  You notice sudden or extreme swelling of your face, hands, ankles, feet, or legs.  You have not felt your  baby move in over an hour.  You have severe headaches that do not go away when you take medicine.  You have vision changes. Summary  The second trimester is from week 14 through week 27 (months 4 through 6). It is also a time when the fetus is growing rapidly.  Your body goes through many changes during pregnancy. The changes vary from woman to woman.  Avoid all smoking, herbs, alcohol, and unprescribed drugs. These chemicals affect the formation and growth your baby.  Do not use any tobacco products, such as cigarettes, chewing tobacco, and e-cigarettes. If you need help quitting, ask your health care provider.  Contact your health care provider if you have any questions. Keep all prenatal visits as told by your health care provider. This is important. This information is not intended to replace advice given to you by your health care provider. Make sure you discuss any questions you have with your health care provider. Document Revised: 11/28/2018 Document Reviewed: 09/11/2016 Elsevier Patient Education  2020 [employer].

## 2019-12-16 NOTE — MAU Provider Note (Signed)
Chief Complaint:  Fall   First Provider Initiated Contact with Patient 12/16/19 2009     HPI: Melanie Hancock is a 24 y.o. [id number] at 56w2dwho presents to maternity admissions reporting falling earlier today.   Fell onto couch and hit right lower rib.  Has had no fetal movement all day.  Does feel FM now.  Does not feel any cramps or contractions, except for some tightening.. She denies LOF, vaginal bleeding, vaginal itching/burning, urinary symptoms, h/a, dizziness, n/v, diarrhea, constipation or fever/chills.    Fall The accident occurred 6 to 12 hours ago. Point of impact: right abdomen and lower ribs. The pain is mild. Pertinent negatives include no abdominal pain or fever. She has tried nothing for the symptoms.   RN Note: Pt fell this am at 0730. Tripped and fell over couch and hit R side on side of couch. Has not felt FM since the fall. Denies VB or LOF. Stomach tight since Sat. Feels like ctx tightness but tightness never goes away.    Past Medical History: Past Medical History:  Diagnosis Date  . Anemia   . Anxiety   . Blood transfusion without reported diagnosis   . Chronic back pain   . Depression   . GERD (gastroesophageal reflux disease)   . Infection    UTI  . Post partum depression   . Pyelonephritis   . UTI (lower urinary tract infection)     Past obstetric history: OB History  Gravida Para Term Preterm AB Living  5 3 3   1 3   SAB TAB Ectopic Multiple Live Births  0 1   0 3    # Outcome Date GA Lbr Len/2nd Weight Sex Delivery Anes PTL Lv  5 Current           4 Term 02/04/16 [redacted]w[redacted]d 56:55 / 00:15 3745 g M Vag-Spont EPI  LIV  3 Term 11/20/14 [redacted]w[redacted]d 01:14 / 00:08 3260 g M Vag-Spont None  LIV  2 Term 02/11/10    M Vag-Spont   LIV  1 TAB              Birth Comments: System Generated. Please review and update pregnancy details.    Past Surgical History: Past Surgical History:  Procedure Laterality Date  . DILATION AND CURETTAGE OF UTERUS    . INDUCED ABORTION     . WISDOM TOOTH EXTRACTION      Family History: Family History  Problem Relation Age of Onset  . Arthritis Mother   . Healthy Father   . Arthritis Maternal Grandmother   . Asthma Son   . Alcohol abuse Neg Hx     Social History: Social History   Tobacco Use  . Smoking status: Former Smoker    Quit date: 01/21/2014    Years since quitting: 5.9  . Smokeless tobacco: Never Used  Substance Use Topics  . Alcohol use: Yes    Comment: rarely  . Drug use: Not Currently    Types: Marijuana    Comment: 2-3 weeks ago    Allergies: No Known Allergies  Meds:  Medications Prior to Admission  Medication Sig Dispense Refill Last Dose  . acetaminophen (TYLENOL) 500 MG tablet Take 2 tablets (1,000 mg total) by mouth every 6 (six) hours as needed. 20 tablet 0   . famotidine (PEPCID) 20 MG tablet famotidine 20 mg tablet  TAKE 1 TABLET BY MOUTH TWICE A DAY     . ferrous sulfate (FERROUSUL) 325 (65 FE) MG tablet  Take 1 tablet (325 mg total) by mouth 2 (two) times daily. 60 tablet 1   . hydrocortisone-pramoxine (PROCTOFOAM-HC) rectal foam Place 1 applicator rectally 2 (two) times daily. 10 g 1   . metoCLOPramide (REGLAN) 10 MG tablet Take 1 tablet (10 mg total) by mouth every 6 (six) hours. 20 tablet 0   . Prenatal Vit-Fe Fumarate-FA (PRENATAL MULTIVITAMIN) TABS tablet Take 1 tablet by mouth daily at 12 noon.     . valACYclovir (VALTREX) 1000 MG tablet valacyclovir 1 gram tablet  TAKE 1 TABLET BY MOUTH EVERY DAY     . zolpidem (AMBIEN) 5 MG tablet zolpidem 5 mg tablet  TAKE 1 TABLET BY MOUTH AT BEDTIME AS NEEDED FOR SLEEP WAITING ON PA       I have reviewed patient's Past Medical Hx, Surgical Hx, Family Hx, Social Hx, medications and allergies.   ROS:  Review of Systems  Constitutional: Negative for chills and fever.  Respiratory: Negative for shortness of breath.        Tender over right lower rib  Gastrointestinal: Negative for abdominal pain.  Genitourinary: Negative for pelvic pain  and vaginal bleeding.  Musculoskeletal: Negative for back pain.   Other systems negative  Physical Exam   Patient Vitals for the past 24 hrs:  BP Temp Pulse Resp Height Weight  12/16/19 1938 (!) 110/47 - 90 - - -  12/16/19 1935 - 98.1 F (36.7 C) - 18 5\' 5"  (1.651 m) 74.4 kg   Constitutional: Well-developed, well-nourished female in no acute distress.  Cardiovascular: normal rate and rhythm Respiratory: normal effort, clear to auscultation bilaterally GI: Abd soft, non-tender, gravid appropriate for gestational age.   No rebound or guarding. MS: Extremities nontender, no edema, normal ROM Neurologic: Alert and oriented x 4.  GU: Neg CVAT.  PELVIC EXAM: deferred  FHT:  Baseline 140 , moderate variability, small accelerations present, no decelerations except one small variable Contractions: Uterine irritability   Labs: Results for orders placed or performed during the hospital encounter of 12/16/19 (from the past 24 hour(s))  Urinalysis, Routine w reflex microscopic     Status: None   Collection Time: 12/16/19  8:23 PM  Result Value Ref Range   Color, Urine YELLOW YELLOW   APPearance CLEAR CLEAR   Specific Gravity, Urine 1.015 1.005 - 1.030   pH 8.0 5.0 - 8.0   Glucose, UA NEGATIVE NEGATIVE mg/dL   Hgb urine dipstick NEGATIVE NEGATIVE   Bilirubin Urine NEGATIVE NEGATIVE   Ketones, ur NEGATIVE NEGATIVE mg/dL   Protein, ur NEGATIVE NEGATIVE mg/dL   Nitrite NEGATIVE NEGATIVE   Leukocytes,Ua NEGATIVE NEGATIVE  Fibrinogen     Status: None   Collection Time: 12/16/19  8:37 PM  Result Value Ref Range   Fibrinogen 414 210 - 475 mg/dL     Imaging:    MAU Course/MDM: I have ordered Fibrinogen per recommendation from Dr 12/18/19. This was normal  NST reviewed, reassuring Uterine irritability noted.  Pt states has not had much water today. Will PO hydrate >> irritability diminished after hydration.   Consult [facility] with presentation, exam findings and test results.   Treatments in MAU included EFM and hydration  Assessment: Single IUP at [redacted]w[redacted]d S/P Fall Decreased fetal movement Uterine irritability  Plan: Discharge home Continue PO hydration Preterm Labor precautions and fetal kick counts Follow up in Office for prenatal visits   Encouraged to return here or to other Urgent Care/ED if she develops worsening of symptoms, increase in pain, fever, or other  concerning symptoms.  Pt stable at time of discharge.  Hansel Feinstein CNM, MSN Certified Nurse-Midwife 12/16/2019 8:10 PM

## 2019-12-16 NOTE — MAU Note (Signed)
Pt fell this am at 0730. Tripped and fell over couch and hit R side on side of couch. Has not felt FM since the fall. Denies VB or LOF. Stomach tight since Sat. Feels like ctx tightness but tightness never goes away.

## 2019-12-16 NOTE — Progress Notes (Signed)
Marie Williams CNM in earlier to discuss dc plan with pt. Written and verbal d/c instructions given and understanding voiced 

## 2020-01-05 ENCOUNTER — Other Ambulatory Visit: Payer: Self-pay

## 2020-01-05 ENCOUNTER — Emergency Department (HOSPITAL_COMMUNITY)
Admission: EM | Admit: 2020-01-05 | Discharge: 2020-01-05 | Disposition: A | Discharge status: Home / Self Care | Payer: No Typology Code available for payment source | Attending: Emergency Medicine | Admitting: Emergency Medicine

## 2020-01-05 ENCOUNTER — Encounter (HOSPITAL_COMMUNITY): Payer: Self-pay

## 2020-01-05 DIAGNOSIS — Y9389 Activity, other specified: Secondary | ICD-10-CM | POA: Diagnosis not present

## 2020-01-05 DIAGNOSIS — Z3A26 26 weeks gestation of pregnancy: Secondary | ICD-10-CM | POA: Diagnosis not present

## 2020-01-05 DIAGNOSIS — Y999 Unspecified external cause status: Secondary | ICD-10-CM | POA: Insufficient documentation

## 2020-01-05 DIAGNOSIS — M542 Cervicalgia: Secondary | ICD-10-CM | POA: Insufficient documentation

## 2020-01-05 DIAGNOSIS — Y92410 Unspecified street and highway as the place of occurrence of the external cause: Secondary | ICD-10-CM | POA: Insufficient documentation

## 2020-01-05 DIAGNOSIS — M549 Dorsalgia, unspecified: Secondary | ICD-10-CM | POA: Diagnosis not present

## 2020-01-05 DIAGNOSIS — O9A212 Injury, poisoning and certain other consequences of external causes complicating pregnancy, second trimester: Secondary | ICD-10-CM | POA: Insufficient documentation

## 2020-01-05 MED ORDER — ACETAMINOPHEN 325 MG PO TABS
650.0000 mg | ORAL_TABLET | Freq: Once | ORAL | Status: AC
  Administered 2020-01-05: 650 mg via ORAL
  Filled 2020-01-05: qty 2

## 2020-01-05 MED ORDER — LIDOCAINE 5 % EX PTCH
1.0000 | MEDICATED_PATCH | CUTANEOUS | Status: DC
  Administered 2020-01-05: 1 via TRANSDERMAL
  Filled 2020-01-05: qty 1

## 2020-01-05 MED ORDER — ACETAMINOPHEN ER 650 MG PO TBCR
650.0000 mg | EXTENDED_RELEASE_TABLET | Freq: Three times a day (TID) | ORAL | 0 refills | Status: DC | PRN
Start: 2020-01-05 — End: 2020-03-24
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
## 2020-01-08 ENCOUNTER — Ambulatory Visit
Admission: EM | Admit: 2020-01-08 | Discharge: 2020-01-08 | Disposition: A | Discharge status: Home / Self Care | Payer: Medicaid Other | Attending: Emergency Medicine | Admitting: Emergency Medicine

## 2020-01-08 DIAGNOSIS — O26892 Other specified pregnancy related conditions, second trimester: Secondary | ICD-10-CM | POA: Insufficient documentation

## 2020-01-08 DIAGNOSIS — K219 Gastro-esophageal reflux disease without esophagitis: Secondary | ICD-10-CM | POA: Diagnosis not present

## 2020-01-08 DIAGNOSIS — Z3A26 26 weeks gestation of pregnancy: Secondary | ICD-10-CM | POA: Insufficient documentation

## 2020-01-08 DIAGNOSIS — Z79899 Other long term (current) drug therapy: Secondary | ICD-10-CM | POA: Diagnosis not present

## 2020-01-08 DIAGNOSIS — Z87891 Personal history of nicotine dependence: Secondary | ICD-10-CM | POA: Diagnosis not present

## 2020-01-08 DIAGNOSIS — R109 Unspecified abdominal pain: Secondary | ICD-10-CM | POA: Insufficient documentation

## 2020-01-08 DIAGNOSIS — O99612 Diseases of the digestive system complicating pregnancy, second trimester: Secondary | ICD-10-CM | POA: Insufficient documentation

## 2020-01-08 LAB — POCT URINALYSIS DIP (MANUAL ENTRY)
Bilirubin, UA: NEGATIVE
Blood, UA: NEGATIVE
Glucose, UA: NEGATIVE mg/dL
Ketones, POC UA: NEGATIVE mg/dL
Leukocytes, UA: NEGATIVE
Nitrite, UA: NEGATIVE
Protein Ur, POC: NEGATIVE mg/dL
Spec Grav, UA: 1.025 (ref 1.010–1.025)
Urobilinogen, UA: 0.2 E.U./dL
pH, UA: 7.5 (ref 5.0–8.0)

## 2020-01-08 MED ORDER — CYCLOBENZAPRINE HCL 5 MG PO TABS
5.0000 mg | ORAL_TABLET | Freq: Two times a day (BID) | ORAL | 0 refills | Status: AC | PRN
Start: 2020-01-08 — End: 2020-01-13

## 2020-01-08 NOTE — Discharge Instructions (Addendum)
May take muscle relaxer as needed for severe spasm, pain. May take Tylenol as well. Please remember that muscle relaxers are not well studied in pregnancy. Your OB did say that this should be safe, though would encourage you to use it very sparingly. Please go to ER for worsening pain, lightheadedness, dizziness.
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
## 2020-01-09 ENCOUNTER — Encounter: Payer: Self-pay | Admitting: Emergency Medicine

## 2020-01-12 ENCOUNTER — Telehealth (HOSPITAL_COMMUNITY): Payer: Self-pay

## 2020-01-12 LAB — CERVICOVAGINAL ANCILLARY ONLY
Bacterial Vaginitis (gardnerella): NEGATIVE
Chlamydia: NEGATIVE
Comment: NEGATIVE
Comment: NEGATIVE
Comment: NEGATIVE
Comment: NORMAL
Neisseria Gonorrhea: NEGATIVE
Trichomonas: POSITIVE — AB

## 2020-01-12 LAB — OB RESULTS CONSOLE HIV ANTIBODY (ROUTINE TESTING): HIV: NONREACTIVE

## 2020-01-12 MED ORDER — METRONIDAZOLE 500 MG PO TABS
500.0000 mg | ORAL_TABLET | Freq: Two times a day (BID) | ORAL | 0 refills | Status: DC
Start: 2020-01-12 — End: 2020-01-12

## 2020-01-12 MED ORDER — METRONIDAZOLE 500 MG PO TABS
2000.0000 mg | ORAL_TABLET | Freq: Once | ORAL | 0 refills | Status: AC
Start: 2020-01-12 — End: 2020-01-12

## 2020-02-12 ENCOUNTER — Other Ambulatory Visit: Payer: Self-pay

## 2020-02-12 ENCOUNTER — Encounter (HOSPITAL_COMMUNITY): Payer: Self-pay | Admitting: Obstetrics & Gynecology

## 2020-02-12 ENCOUNTER — Inpatient Hospital Stay (HOSPITAL_COMMUNITY)
Admission: AD | Admit: 2020-02-12 | Discharge: 2020-02-13 | Disposition: A | Discharge status: Home / Self Care | Payer: Medicaid Other | Attending: Obstetrics & Gynecology | Admitting: Obstetrics & Gynecology

## 2020-02-12 DIAGNOSIS — K6289 Other specified diseases of anus and rectum: Secondary | ICD-10-CM

## 2020-02-12 DIAGNOSIS — Z3689 Encounter for other specified antenatal screening: Secondary | ICD-10-CM

## 2020-02-12 DIAGNOSIS — O26893 Other specified pregnancy related conditions, third trimester: Secondary | ICD-10-CM

## 2020-02-12 DIAGNOSIS — Z3A31 31 weeks gestation of pregnancy: Secondary | ICD-10-CM | POA: Diagnosis not present

## 2020-02-12 DIAGNOSIS — O99613 Diseases of the digestive system complicating pregnancy, third trimester: Secondary | ICD-10-CM | POA: Insufficient documentation

## 2020-02-12 DIAGNOSIS — B373 Candidiasis of vulva and vagina: Secondary | ICD-10-CM | POA: Diagnosis not present

## 2020-02-12 DIAGNOSIS — R109 Unspecified abdominal pain: Secondary | ICD-10-CM | POA: Diagnosis not present

## 2020-02-12 DIAGNOSIS — R110 Nausea: Secondary | ICD-10-CM

## 2020-02-12 DIAGNOSIS — Z87891 Personal history of nicotine dependence: Secondary | ICD-10-CM | POA: Diagnosis not present

## 2020-02-12 DIAGNOSIS — K219 Gastro-esophageal reflux disease without esophagitis: Secondary | ICD-10-CM | POA: Diagnosis not present

## 2020-02-12 DIAGNOSIS — Z87440 Personal history of urinary (tract) infections: Secondary | ICD-10-CM | POA: Diagnosis not present

## 2020-02-12 DIAGNOSIS — O98813 Other maternal infectious and parasitic diseases complicating pregnancy, third trimester: Secondary | ICD-10-CM | POA: Insufficient documentation

## 2020-02-12 DIAGNOSIS — Z79899 Other long term (current) drug therapy: Secondary | ICD-10-CM | POA: Insufficient documentation

## 2020-02-12 DIAGNOSIS — B3731 Acute candidiasis of vulva and vagina: Secondary | ICD-10-CM

## 2020-02-12 LAB — URINALYSIS, ROUTINE W REFLEX MICROSCOPIC
Bilirubin Urine: NEGATIVE
Glucose, UA: NEGATIVE mg/dL
Hgb urine dipstick: NEGATIVE
Ketones, ur: 5 mg/dL — AB
Leukocytes,Ua: NEGATIVE
Nitrite: NEGATIVE
Protein, ur: NEGATIVE mg/dL
Specific Gravity, Urine: 1.012 (ref 1.005–1.030)
pH: 7 (ref 5.0–8.0)

## 2020-02-12 LAB — FETAL FIBRONECTIN: Fetal Fibronectin: NEGATIVE

## 2020-02-12 LAB — WET PREP, GENITAL
Clue Cells Wet Prep HPF POC: NONE SEEN
Sperm: NONE SEEN
Trich, Wet Prep: NONE SEEN

## 2020-02-12 MED ORDER — ACETAMINOPHEN 500 MG PO TABS
1000.0000 mg | ORAL_TABLET | Freq: Once | ORAL | Status: AC
  Administered 2020-02-12: 1000 mg via ORAL
  Filled 2020-02-12: qty 2

## 2020-02-12 MED ORDER — CYCLOBENZAPRINE HCL 5 MG PO TABS
10.0000 mg | ORAL_TABLET | Freq: Once | ORAL | Status: AC
  Administered 2020-02-12: 10 mg via ORAL
  Filled 2020-02-12: qty 2

## 2020-02-12 MED ORDER — ONDANSETRON HCL 4 MG PO TABS
8.0000 mg | ORAL_TABLET | Freq: Once | ORAL | Status: AC
  Administered 2020-02-12: 8 mg via ORAL
  Filled 2020-02-12: qty 2

## 2020-02-12 NOTE — MAU Note (Signed)
Patient reports lower abdominal cramping that has been ongoing for 2 days, today started having a lot more pressure in her tailbone.  Denies VB/LOF.  Endorses + FM.  Noticed less w/ the contractions.

## 2020-02-12 NOTE — MAU Provider Note (Signed)
History     CSN: [id number]  Arrival date and time: 02/12/20 2157   First Provider Initiated Contact with Patient 02/12/20 2248      Chief Complaint  Patient presents with   Contractions   Melanie Hancock is a 24 y.o. [id number] at [redacted]w[redacted]d who receives care at [facility]  She presents today for Contractions.  She states for the past 3 days she has been experiencing contractions.  Patient states the pain starts in her lower abdomen and radiates to her lower back/tailbone area.  She describes the pain as "really bad menstrual cramps."  However, she states "the pain is consistent and will lighten up, but then my stomach gets tight for 3 minutes."  Patient rates the pain a 10/10 when it occurs and states "because I have to stop and breath through it and rub my belly." She states that when she is experiencing the abdominal tightening, her abdomen also becomes sensitive to touch.  Patient reports the pain is worsened and improved with certain positions. She reports that she started drinking more water about  2 days ago with no improvement of symptoms.  Patient states she called her provider's office and was told to come for evaluation. Patient denies vaginal bleeding or leaking, but reports Trich diagnosis and treatment in May with continued discharge.  Patient also reports some mild nausea and requests medication.      OB History    Gravida  5   Para  3   Term  3   Preterm      AB  1   Living  3     SAB  0   TAB  1   Ectopic      Multiple  0   Live Births  3           Past Medical History:  Diagnosis Date   Anemia    Anxiety    Blood transfusion without reported diagnosis    Chronic back pain    Depression    GERD (gastroesophageal reflux disease)    Infection    UTI   Post partum depression    Pyelonephritis    UTI (lower urinary tract infection)     Past Surgical History:  Procedure Laterality Date   DILATION AND CURETTAGE OF UTERUS     INDUCED  ABORTION     WISDOM TOOTH EXTRACTION      Family History  Problem Relation Age of Onset   Arthritis Mother    Healthy Father    Arthritis Maternal Grandmother    Asthma Son    Alcohol abuse Neg Hx     Social History   Tobacco Use   Smoking status: Former Smoker    Quit date: 01/21/2014    Years since quitting: 6.0   Smokeless tobacco: Never Used  Vaping Use   Vaping Use: Former   Substances: Nicotine, Flavoring  Substance Use Topics   Alcohol use: Yes    Comment: rarely   Drug use: Not Currently    Types: Marijuana    Comment: 2-3 weeks ago    Allergies: No Known Allergies  Medications Prior to Admission  Medication Sig Dispense Refill Last Dose   famotidine (PEPCID) 10 MG tablet Take 10 mg by mouth 2 (two) times daily.   02/11/2020 at Unknown time   ondansetron (ZOFRAN) 4 MG tablet Take 4 mg by mouth every 8 (eight) hours as needed for nausea or vomiting.   02/11/2020 at Unknown time  Prenatal Vit-Fe Fumarate-FA (PRENATAL MULTIVITAMIN) TABS tablet Take 1 tablet by mouth daily at 12 noon.   02/11/2020 at Unknown time   valACYclovir (VALTREX) 1000 MG tablet Take 1,000 mg by mouth as needed (break out).    Past Week at Unknown time   acetaminophen (TYLENOL 8 HOUR) 650 MG CR tablet Take 1 tablet (650 mg total) by mouth every 8 (eight) hours as needed for pain. 30 tablet 0 Unknown at Unknown time   acetaminophen (TYLENOL) 500 MG tablet Take 2 tablets (1,000 mg total) by mouth every 6 (six) hours as needed. 20 tablet 0 Unknown at Unknown time   ferrous sulfate (FERROUSUL) 325 (65 FE) MG tablet Take 1 tablet (325 mg total) by mouth 2 (two) times daily. (Patient not taking: Reported on 01/05/2020) 60 tablet 1    hydrocortisone-pramoxine (PROCTOFOAM-HC) rectal foam Place 1 applicator rectally 2 (two) times daily. (Patient taking differently: Place 1 applicator rectally as needed for hemorrhoids. ) 10 g 1    metoCLOPramide (REGLAN) 10 MG tablet Take 1 tablet (10 mg  total) by mouth every 6 (six) hours. 20 tablet 0 Unknown at Unknown time   scopolamine (TRANSDERM-SCOP) 1 MG/3DAYS Place 1 patch onto the skin every 3 (three) days.   Unknown at Unknown time   zolpidem (AMBIEN) 5 MG tablet Take 5 mg by mouth at bedtime as needed for sleep.    Unknown at Unknown time    Review of Systems  Eyes: Positive for visual disturbance ("Stars").  Respiratory: Negative for cough and shortness of breath.   Gastrointestinal: Positive for abdominal pain and nausea. Negative for vomiting.  Genitourinary: Positive for pelvic pain and vaginal discharge. Negative for difficulty urinating, dysuria and vaginal bleeding.  Musculoskeletal: Positive for back pain (Tailbone).  Neurological: Positive for headaches. Negative for dizziness and light-headedness.   Physical Exam   Blood pressure (!) 113/50, pulse 82, temperature 98.4 F (36.9 C), resp. rate 19, weight 76.8 kg, last menstrual period 07/06/2019.  Physical Exam Constitutional:      Appearance: Normal appearance.  HENT:     Head: Normocephalic and atraumatic.  Eyes:     Conjunctiva/sclera: Conjunctivae normal.  Cardiovascular:     Rate and Rhythm: Regular rhythm.     Heart sounds: Normal heart sounds.  Pulmonary:     Effort: Pulmonary effort is normal.     Breath sounds: Normal breath sounds.  Genitourinary:    Labia:        Right: No tenderness.        Left: No tenderness.      Vagina: Vaginal discharge (Moderate amt thick white curdy discharge) present. No bleeding.     Cervix: No cervical motion tenderness, friability or erythema.     Rectum: External hemorrhoid present.     Comments: Speculum Exam: -Normal External Genitalia: Non tender, no apparent discharge at introitus.  -Vaginal Vault: Pink mucosa with good rugae. -wet prep collected -Cervix:Pink, no lesions, cysts, or polyps.  Appears closed. No active bleeding from os-GC/CT collected -Bimanual Exam:  Closed internally/1cm externally. No  tenderness. Stool palpated in rectum.   Musculoskeletal:     Cervical back: Normal range of motion.  Skin:    General: Skin is warm and dry.  Neurological:     Mental Status: She is alert and oriented to person, place, and time.  Psychiatric:        Mood and Affect: Mood normal.        Behavior: Behavior normal.  Thought Content: Thought content normal.     Fetal Assessment 135 bpm, Mod Var, -Decels, +Accels Toco: No Ctx Graphed  MAU Course   Results for orders placed or performed during the hospital encounter of 02/12/20 (from the past 24 hour(s))  Urinalysis, Routine w reflex microscopic     Status: Abnormal   Collection Time: 02/12/20 10:04 PM  Result Value Ref Range   Color, Urine YELLOW YELLOW   APPearance CLEAR CLEAR   Specific Gravity, Urine 1.012 1.005 - 1.030   pH 7.0 5.0 - 8.0   Glucose, UA NEGATIVE NEGATIVE mg/dL   Hgb urine dipstick NEGATIVE NEGATIVE   Bilirubin Urine NEGATIVE NEGATIVE   Ketones, ur 5 (A) NEGATIVE mg/dL   Protein, ur NEGATIVE NEGATIVE mg/dL   Nitrite NEGATIVE NEGATIVE   Leukocytes,Ua NEGATIVE NEGATIVE  Fetal fibronectin     Status: None   Collection Time: 02/12/20 10:49 PM  Result Value Ref Range   Fetal Fibronectin NEGATIVE NEGATIVE  Wet prep, genital     Status: Abnormal   Collection Time: 02/12/20 10:49 PM  Result Value Ref Range   Yeast Wet Prep HPF POC PRESENT (A) NONE SEEN   Trich, Wet Prep NONE SEEN NONE SEEN   Clue Cells Wet Prep HPF POC NONE SEEN NONE SEEN   WBC, Wet Prep HPF POC MANY (A) NONE SEEN   Sperm NONE SEEN    No results found.  MDM PE Labs: Wet Prep, GC/CT EFM Pain Medication Assessment and Plan  24 year old [id number]  SIUP at 31.5weeks Cat I FT Abdominal Cramping Rectal Pain Nausea  -POC reviewed. -Exam performed and findings discussed. -Cultures collected and pending.  -Informed that rectal/tailbone pain may be related to stool in rectum. -Encouraged to defecate.  -Will give tylenol and flexeril  for pain. -NST reactive -Zofran 8mg  odt, ginger ale, and crackers for nausea. -Will monitor and reassess.   Maryann Conners MSN, CNM 02/12/2020, 10:48 PM   Reassessment (12:23 AM) Candidiasis of Vagina  -Patient reports back pain has resolved, but rectal/tail bone pain continues. -Patient states she was unable to have bowel movement.  -Informed of WP results positive for yeast. -Discussed how this can give feeling of rectal/vaginal pressure. -Discussed negative fFN. -Rx for Terazol 3 sent to pharmacy on file.  -Instructed to keep appt as scheduled.  -Patient without questions or concerns. -Encouraged to call or return to MAU if symptoms worsen or with the onset of new symptoms. -Discharged to home in stable condition.  Maryann Conners MSN, CNM Advanced Practice Provider, [facility]

## 2020-02-12 NOTE — MAU Note (Signed)
Pt had break out and took valtrex last week. Pt is also wanting to be re-swabbed for trich.

## 2020-02-13 MED ORDER — TERCONAZOLE 0.8 % VA CREA
1.0000 | TOPICAL_CREAM | Freq: Every day | VAGINAL | 0 refills | Status: DC
Start: 2020-02-13 — End: 2020-02-26

## 2020-02-13 NOTE — Discharge Instructions (Signed)
Abdominal Pain During Pregnancy  Abdominal pain is common during pregnancy, and has many possible causes. Some causes are more serious than others, and sometimes the cause is not known. Abdominal pain can be a sign that labor is starting. It can also be caused by normal growth and stretching of muscles and ligaments during pregnancy. Always tell your health care provider if you have any abdominal pain. Follow these instructions at home:  Do not have sex or put anything in your vagina until your pain goes away completely.  Get plenty of rest until your pain improves.  Drink enough fluid to keep your urine pale yellow.  Take over-the-counter and prescription medicines only as told by your health care provider.  Keep all follow-up visits as told by your health care provider. This is important. Contact a health care provider if:  Your pain continues or gets worse after resting.  You have lower abdominal pain that: ? Comes and goes at regular intervals. ? Spreads to your back. ? Is similar to menstrual cramps.  You have pain or burning when you urinate. Get help right away if:  You have a fever or chills.  You have vaginal bleeding.  You are leaking fluid from your vagina.  You are passing tissue from your vagina.  You have vomiting or diarrhea that lasts for more than 24 hours.  Your baby is moving less than usual.  You feel very weak or faint.  You have shortness of breath.  You develop severe pain in your upper abdomen. Summary  Abdominal pain is common during pregnancy, and has many possible causes.  If you experience abdominal pain during pregnancy, tell your health care provider right away.  Follow your health care provider's home care instructions and keep all follow-up visits as directed. This information is not intended to replace advice given to you by your health care provider. Make sure you discuss any questions you have with your health care  provider. Document Revised: 11/24/2018 Document Reviewed: 11/08/2016 Elsevier Patient Education  2020 Elsevier Inc.  

## 2020-02-26 ENCOUNTER — Encounter (HOSPITAL_COMMUNITY): Payer: Self-pay | Admitting: Obstetrics and Gynecology

## 2020-02-26 ENCOUNTER — Other Ambulatory Visit: Payer: Self-pay

## 2020-02-26 ENCOUNTER — Inpatient Hospital Stay (HOSPITAL_COMMUNITY)
Admission: AD | Admit: 2020-02-26 | Discharge: 2020-02-26 | Disposition: A | Discharge status: Home / Self Care | Payer: Medicaid Other | Attending: Obstetrics and Gynecology | Admitting: Obstetrics and Gynecology

## 2020-02-26 ENCOUNTER — Inpatient Hospital Stay (HOSPITAL_BASED_OUTPATIENT_CLINIC_OR_DEPARTMENT_OTHER): Payer: Medicaid Other

## 2020-02-26 DIAGNOSIS — O368130 Decreased fetal movements, third trimester, not applicable or unspecified: Secondary | ICD-10-CM | POA: Insufficient documentation

## 2020-02-26 DIAGNOSIS — O99283 Endocrine, nutritional and metabolic diseases complicating pregnancy, third trimester: Secondary | ICD-10-CM | POA: Insufficient documentation

## 2020-02-26 DIAGNOSIS — O99613 Diseases of the digestive system complicating pregnancy, third trimester: Secondary | ICD-10-CM | POA: Diagnosis not present

## 2020-02-26 DIAGNOSIS — O359XX0 Maternal care for (suspected) fetal abnormality and damage, unspecified, not applicable or unspecified: Secondary | ICD-10-CM | POA: Diagnosis not present

## 2020-02-26 DIAGNOSIS — Z3A33 33 weeks gestation of pregnancy: Secondary | ICD-10-CM

## 2020-02-26 DIAGNOSIS — O368193 Decreased fetal movements, unspecified trimester, fetus 3: Secondary | ICD-10-CM

## 2020-02-26 DIAGNOSIS — O99323 Drug use complicating pregnancy, third trimester: Secondary | ICD-10-CM | POA: Diagnosis not present

## 2020-02-26 DIAGNOSIS — K219 Gastro-esophageal reflux disease without esophagitis: Secondary | ICD-10-CM | POA: Diagnosis not present

## 2020-02-26 DIAGNOSIS — Z79899 Other long term (current) drug therapy: Secondary | ICD-10-CM | POA: Insufficient documentation

## 2020-02-26 DIAGNOSIS — O211 Hyperemesis gravidarum with metabolic disturbance: Secondary | ICD-10-CM

## 2020-02-26 DIAGNOSIS — Z87891 Personal history of nicotine dependence: Secondary | ICD-10-CM | POA: Insufficient documentation

## 2020-02-26 DIAGNOSIS — O368190 Decreased fetal movements, unspecified trimester, not applicable or unspecified: Secondary | ICD-10-CM

## 2020-02-26 DIAGNOSIS — K5900 Constipation, unspecified: Secondary | ICD-10-CM | POA: Diagnosis not present

## 2020-02-26 DIAGNOSIS — O219 Vomiting of pregnancy, unspecified: Secondary | ICD-10-CM

## 2020-02-26 DIAGNOSIS — O26893 Other specified pregnancy related conditions, third trimester: Secondary | ICD-10-CM | POA: Insufficient documentation

## 2020-02-26 DIAGNOSIS — O212 Late vomiting of pregnancy: Secondary | ICD-10-CM | POA: Insufficient documentation

## 2020-02-26 DIAGNOSIS — E860 Dehydration: Secondary | ICD-10-CM | POA: Diagnosis not present

## 2020-02-26 LAB — WET PREP, GENITAL
Clue Cells Wet Prep HPF POC: NONE SEEN
Sperm: NONE SEEN
Trich, Wet Prep: NONE SEEN
Yeast Wet Prep HPF POC: NONE SEEN

## 2020-02-26 LAB — CBC
HCT: 30.4 % — ABNORMAL LOW (ref 36.0–46.0)
Hemoglobin: 9.4 g/dL — ABNORMAL LOW (ref 12.0–15.0)
MCH: 26.2 pg (ref 26.0–34.0)
MCHC: 30.9 g/dL (ref 30.0–36.0)
MCV: 84.7 fL (ref 80.0–100.0)
Platelets: 342 10*3/uL (ref 150–400)
RBC: 3.59 MIL/uL — ABNORMAL LOW (ref 3.87–5.11)
RDW: 14.4 % (ref 11.5–15.5)
WBC: 12.7 10*3/uL — ABNORMAL HIGH (ref 4.0–10.5)
nRBC: 0 % (ref 0.0–0.2)

## 2020-02-26 LAB — URINALYSIS, ROUTINE W REFLEX MICROSCOPIC
Bilirubin Urine: NEGATIVE
Glucose, UA: NEGATIVE mg/dL
Hgb urine dipstick: NEGATIVE
Ketones, ur: 80 mg/dL — AB
Nitrite: NEGATIVE
Protein, ur: 30 mg/dL — AB
Specific Gravity, Urine: 1.027 (ref 1.005–1.030)
pH: 6 (ref 5.0–8.0)

## 2020-02-26 LAB — COMPREHENSIVE METABOLIC PANEL
ALT: 12 U/L (ref 0–44)
AST: 17 U/L (ref 15–41)
Albumin: 3.1 g/dL — ABNORMAL LOW (ref 3.5–5.0)
Alkaline Phosphatase: 102 U/L (ref 38–126)
Anion gap: 12 (ref 5–15)
BUN: 7 mg/dL (ref 6–20)
CO2: 20 mmol/L — ABNORMAL LOW (ref 22–32)
Calcium: 9.1 mg/dL (ref 8.9–10.3)
Chloride: 103 mmol/L (ref 98–111)
Creatinine, Ser: 0.64 mg/dL (ref 0.44–1.00)
GFR calc Af Amer: >60 mL/min (ref >60)
GFR calc non Af Amer: >60 mL/min (ref >60)
Glucose, Bld: 97 mg/dL (ref 70–99)
Potassium: 3.5 mmol/L (ref 3.5–5.1)
Sodium: 135 mmol/L (ref 135–145)
Total Bilirubin: 0.9 mg/dL (ref 0.3–1.2)
Total Protein: 7.1 g/dL (ref 6.5–8.1)

## 2020-02-26 LAB — RAPID URINE DRUG SCREEN, HOSP PERFORMED
Amphetamines: NOT DETECTED
Barbiturates: NOT DETECTED
Benzodiazepines: NOT DETECTED
Cocaine: NOT DETECTED
Opiates: NOT DETECTED
Tetrahydrocannabinol: POSITIVE — AB

## 2020-02-26 LAB — LIPASE, BLOOD: Lipase: 23 U/L (ref 11–51)

## 2020-02-26 MED ORDER — FAMOTIDINE IN NACL 20-0.9 MG/50ML-% IV SOLN
20.0000 mg | Freq: Once | INTRAVENOUS | Status: AC
  Administered 2020-02-26: 20 mg via INTRAVENOUS
  Filled 2020-02-26: qty 50

## 2020-02-26 MED ORDER — LACTATED RINGERS IV BOLUS
1000.0000 mL | Freq: Once | INTRAVENOUS | Status: AC
  Administered 2020-02-26: 1000 mL via INTRAVENOUS

## 2020-02-26 MED ORDER — PROCHLORPERAZINE EDISYLATE 10 MG/2ML IJ SOLN
10.0000 mg | Freq: Once | INTRAMUSCULAR | Status: AC
  Administered 2020-02-26: 10 mg via INTRAVENOUS
  Filled 2020-02-26: qty 2

## 2020-02-26 MED ORDER — ONDANSETRON HCL 4 MG/2ML IJ SOLN
4.0000 mg | Freq: Once | INTRAMUSCULAR | Status: AC
  Administered 2020-02-26: 4 mg via INTRAVENOUS
  Filled 2020-02-26: qty 2

## 2020-02-26 NOTE — MAU Note (Addendum)
Assisted to rm via wc, assisted to bed and with clothing.  Has been throwing up for 2 days.  Pain on rt side of abd, top to bottom started about 0400.  Has been able to sleep, unable to tell if constant, just hurts.  Denies bleeding or LOF.  Denies fever or diarrhea, has been constipated.  Has had small bm- but not normal. Not as much movement today. Urinated while in the lobby

## 2020-02-26 NOTE — Discharge Instructions (Signed)
Hyperemesis Gravidarum Hyperemesis gravidarum is a severe form of nausea and vomiting that happens during pregnancy. Hyperemesis is worse than morning sickness. It may cause you to have nausea or vomiting all day for many days. It may keep you from eating and drinking enough food and liquids, which can lead to dehydration, malnutrition, and weight loss. Hyperemesis usually occurs during the first half (the first 20 weeks) of pregnancy. It often goes away once a woman is in her second half of pregnancy. However, sometimes hyperemesis continues through an entire pregnancy. What are the causes? The cause of this condition is not known. It may be related to changes in chemicals (hormones) in the body during pregnancy, such as the high level of pregnancy hormone (human chorionic gonadotropin) or the increase in the female sex hormone (estrogen). What are the signs or symptoms? Symptoms of this condition include:  Nausea that does not go away.  Vomiting that does not allow you to keep any food down.  Weight loss.  Body fluid loss (dehydration).  Having no desire to eat, or not liking food that you have previously enjoyed. How is this diagnosed? This condition may be diagnosed based on:  A physical exam.  Your medical history.  Your symptoms.  Blood tests.  Urine tests. How is this treated? This condition is managed by controlling symptoms. This may include:  Following an eating plan. This can help lessen nausea and vomiting.  Taking prescription medicines. An eating plan and medicines are often used together to help control symptoms. If medicines do not help relieve nausea and vomiting, you may need to receive fluids through an IV at the hospital. Follow these instructions at home: Eating and drinking   Avoid the following: ? Drinking fluids with meals. Try not to drink anything during the 30 minutes before and after your meals. ? Drinking more than 1 cup of fluid at a  time. ? Eating foods that trigger your symptoms. These may include spicy foods, coffee, high-fat foods, very sweet foods, and acidic foods. ? Skipping meals. Nausea can be more intense on an empty stomach. If you cannot tolerate food, do not force it. Try sucking on ice chips or other frozen items and make up for missed calories later. ? Lying down within 2 hours after eating. ? Being exposed to environmental triggers. These may include food smells, smoky rooms, closed spaces, rooms with strong smells, warm or humid places, overly loud and noisy rooms, and rooms with motion or flickering lights. Try eating meals in a well-ventilated area that is free of strong smells. ? Quick and sudden changes in your movement. ? Taking iron pills and multivitamins that contain iron. If you take prescription iron pills, do not stop taking them unless your health care provider approves. ? Preparing food. The smell of food can spoil your appetite or trigger nausea.  To help relieve your symptoms: ? Listen to your body. Everyone is different and has different preferences. Find what works best for you. ? Eat and drink slowly. ? Eat 5-6 small meals daily instead of 3 large meals. Eating small meals and snacks can help you avoid an empty stomach. ? In the morning, before getting out of bed, eat a couple of crackers to avoid moving around on an empty stomach. ? Try eating starchy foods as these are usually tolerated well. Examples include cereal, toast, bread, potatoes, pasta, rice, and pretzels. ? Include at least 1 serving of protein with your meals and snacks. Protein options include   lean meats, poultry, seafood, beans, nuts, nut butters, eggs, cheese, and yogurt. ? Try eating a protein-rich snack before bed. Examples of a protein-rick snack include cheese and crackers or a peanut butter sandwich made with 1 slice of whole-wheat bread and 1 tsp (5 g) of peanut butter. ? Eat or suck on things that have ginger in them.  It may help relieve nausea. Add  tsp ground ginger to hot tea or choose ginger tea. ? Try drinking 100% fruit juice or an electrolyte drink. An electrolyte drink contains sodium, potassium, and chloride. ? Drink fluids that are cold, clear, and carbonated or sour. Examples include lemonade, ginger ale, lemon-lime soda, ice water, and sparkling water. ? Brush your teeth or use a mouth rinse after meals. ? Talk with your health care provider about starting a supplement of vitamin B6. General instructions  Take over-the-counter and prescription medicines only as told by your health care provider.  Follow instructions from your health care provider about eating or drinking restrictions.  Continue to take your prenatal vitamins as told by your health care provider. If you are having trouble taking your prenatal vitamins, talk with your health care provider about different options.  Keep all follow-up and pre-birth (prenatal) visits as told by your health care provider. This is important. Contact a health care provider if:  You have pain in your abdomen.  You have a severe headache.  You have vision problems.  You are losing weight.  You feel weak or dizzy. Get help right away if:  You cannot drink fluids without vomiting.  You vomit blood.  You have constant nausea and vomiting.  You are very weak.  You faint.  You have a fever and your symptoms suddenly get worse. Summary  Hyperemesis gravidarum is a severe form of nausea and vomiting that happens during pregnancy.  Making some changes to your eating habits may help relieve nausea and vomiting.  This condition may be managed with medicine.  If medicines do not help relieve nausea and vomiting, you may need to receive fluids through an IV at the hospital. This information is not intended to replace advice given to you by your health care provider. Make sure you discuss any questions you have with your health care  provider. Document Revised: 08/26/2017 Document Reviewed: 04/04/2016 Elsevier Patient Education  2020 Elsevier Inc.   Dehydration, Adult Dehydration is condition in which there is not enough water or other fluids in the body. This happens when a person loses more fluids than he or she takes in. Important body parts cannot work right without the right amount of fluids. Any loss of fluids from the body can cause dehydration. Dehydration can be mild, worse, or very bad. It should be treated right away to keep it from getting very bad. What are the causes? This condition may be caused by:  Conditions that cause loss of water or other fluids, such as: ? Watery poop (diarrhea). ? Vomiting. ? Sweating a lot. ? Peeing (urinating) a lot.  Not drinking enough fluids, especially when you: ? Are ill. ? Are doing things that take a lot of energy to do.  Other illnesses and conditions, such as fever or infection.  Certain medicines, such as medicines that take extra fluid out of the body (diuretics).  Lack of safe drinking water.  Not being able to get enough water and food. What increases the risk? The following factors may make you more likely to develop this condition:  Having a   long-term (chronic) illness that has not been treated the right way, such as: ? Diabetes. ? Heart disease. ? Kidney disease.  Being 34 years of age or older.  Having a disability.  Living in a place that is high above the ground or sea (high in altitude). The thinner, dried air causes more fluid loss.  Doing exercises that put stress on your body for a long time. What are the signs or symptoms? Symptoms of dehydration depend on how bad it is. Mild or worse dehydration  Thirst.  Dry lips or dry mouth.  Feeling dizzy or light-headed, especially when you stand up from sitting.  Muscle cramps.  Your body making: ? Dark pee (urine). Pee may be the color of tea. ? Less pee than normal. ? Less tears  than normal.  Headache. Very bad dehydration  Changes in skin. Skin may: ? Be cold to the touch (clammy). ? Be blotchy or pale. ? Not go back to normal right after you lightly pinch it and let it go.  Little or no tears, pee, or sweat.  Changes in vital signs, such as: ? Fast breathing. ? Low blood pressure. ? Weak pulse. ? Pulse that is more than 100 beats a minute when you are sitting still.  Other changes, such as: ? Feeling very thirsty. ? Eyes that look hollow (sunken). ? Cold hands and feet. ? Being mixed up (confused). ? Being very tired (lethargic) or having trouble waking from sleep. ? Short-term weight loss. ? Loss of consciousness. How is this treated? Treatment for this condition depends on how bad it is. Treatment should start right away. Do not wait until your condition gets very bad. Very bad dehydration is an emergency. You will need to go to a hospital.  Mild or worse dehydration can be treated at home. You may be asked to: ? Drink more fluids. ? Drink an oral rehydration solution (ORS). This drink helps get the right amounts of fluids and salts and minerals in the blood (electrolytes).  Very bad dehydration can be treated: ? With fluids through an IV tube. ? By getting normal levels of salts and minerals in your blood. This is often done by giving salts and minerals through a tube. The tube is passed through your nose and into your stomach. ? By treating the root cause. Follow these instructions at home: Oral rehydration solution If told by your doctor, drink an ORS:  Make an ORS. Use instructions on the package.  Start by drinking small amounts, about  cup (120 mL) every 5-10 minutes.  Slowly drink more until you have had the amount that your doctor said to have. Eating and drinking         Drink enough clear fluid to keep your pee pale yellow. If you were told to drink an ORS, finish the ORS first. Then, start slowly drinking other clear  fluids. Drink fluids such as: ? Water. Do not drink only water. Doing that can make the salt (sodium) level in your body get too low. ? Water from ice chips you suck on. ? Fruit juice that you have added water to (diluted). ? Low-calorie sports drinks.  Eat foods that have the right amounts of salts and minerals, such as: ? Bananas. ? Oranges. ? Potatoes. ? Tomatoes. ? Spinach.  Do not drink alcohol.  Avoid: ? Drinks that have a lot of sugar. These include:  High-calorie sports drinks.  Fruit juice that you did not add water to.  Soda.  Caffeine. ? Foods that are greasy or have a lot of fat or sugar. General instructions  Take over-the-counter and prescription medicines only as told by your doctor.  Do not take salt tablets. Doing that can make the salt level in your body get too high.  Return to your normal activities as told by your doctor. Ask your doctor what activities are safe for you.  Keep all follow-up visits as told by your doctor. This is important. Contact a doctor if:  You have pain in your belly (abdomen) and the pain: ? Gets worse. ? Stays in one place.  You have a rash.  You have a stiff neck.  You get angry or annoyed (irritable) more easily than normal.  You are more tired or have a harder time waking than normal.  You feel: ? Weak or dizzy. ? Very thirsty. Get help right away if you have:  Any symptoms of very bad dehydration.  Symptoms of vomiting, such as: ? You cannot eat or drink without vomiting. ? Your vomiting gets worse or does not go away. ? Your vomit has blood or green stuff in it.  Symptoms that get worse with treatment.  A fever.  A very bad headache.  Problems with peeing or pooping (having a bowel movement), such as: ? Watery poop that gets worse or does not go away. ? Blood in your poop (stool). This may cause poop to look black and tarry. ? Not peeing in 6-8 hours. ? Peeing only a small amount of very dark pee  in 6-8 hours.  Trouble breathing. These symptoms may be an emergency. Do not wait to see if the symptoms will go away. Get medical help right away. Call your local emergency services (911 in the [place] Do not drive yourself to the hospital. Summary  Dehydration is a condition in which there is not enough water or other fluids in the body. This happens when a person loses more fluids than he or she takes in.  Treatment for this condition depends on how bad it is. Treatment should be started right away. Do not wait until your condition gets very bad.  Drink enough clear fluid to keep your pee pale yellow. If you were told to drink an oral rehydration solution (ORS), finish the ORS first. Then, start slowly drinking other clear fluids.  Take over-the-counter and prescription medicines only as told by your doctor.  Get help right away if you have any symptoms of very bad dehydration. This information is not intended to replace advice given to you by your health care provider. Make sure you discuss any questions you have with your health care provider. Document Revised: 03/19/2019 Document Reviewed: 03/19/2019 Elsevier Patient Education  2020 [employer].

## 2020-02-26 NOTE — MAU Provider Note (Addendum)
History     CSN: [id number]  Arrival date and time: 02/26/20 11911602   First Provider Initiated Contact with Patient 02/26/20 1648      Chief Complaint  Patient presents with  . Abdominal Pain  . Emesis  . Nausea  . Constipation  . Decreased Fetal Movement   24 y.o. [id number] @33 .4 wks with hx of HEG presenting with N/V and abd pain. Reports onset of N/V 2 days ago. She uses Protonix and Zofran but did not take today. Abd pain is right upper and lower. Rates pain 10/10. Cannot describe the pain. Endorses ctx q30 min. Denies VB or LOF. I asked about FM multiple times and pt finally answered "no", but could not obtain more detail. Admits to MJ use for nausea, last used 2 days ago. HPI difficult to obtain d/t pt falling asleep multiple times.   OB History    Gravida  5   Para  3   Term  3   Preterm      AB  1   Living  3     SAB  0   TAB  1   Ectopic      Multiple  0   Live Births  3           Past Medical History:  Diagnosis Date  . Anemia   . Anxiety   . Blood transfusion without reported diagnosis   . Chronic back pain   . Depression   . GERD (gastroesophageal reflux disease)   . Infection    UTI  . Post partum depression   . Pyelonephritis   . UTI (lower urinary tract infection)     Past Surgical History:  Procedure Laterality Date  . DILATION AND CURETTAGE OF UTERUS    . INDUCED ABORTION    . WISDOM TOOTH EXTRACTION      Family History  Problem Relation Age of Onset  . Arthritis Mother   . Healthy Father   . Arthritis Maternal Grandmother   . Asthma Son   . Alcohol abuse Neg Hx     Social History   Tobacco Use  . Smoking status: Former Smoker    Quit date: 01/21/2014    Years since quitting: 6.1  . Smokeless tobacco: Never Used  Vaping Use  . Vaping Use: Former  . Substances: Nicotine, Flavoring  Substance Use Topics  . Alcohol use: Not Currently    Comment: rarely  . Drug use: Not Currently    Types: Marijuana    Comment: 2-3  weeks ago    Allergies: No Known Allergies  Medications Prior to Admission  Medication Sig Dispense Refill Last Dose  . ondansetron (ZOFRAN) 4 MG tablet Take 4 mg by mouth every 8 (eight) hours as needed for nausea or vomiting.   Past Week at Unknown time  . Prenatal Vit-Fe Fumarate-FA (PRENATAL MULTIVITAMIN) TABS tablet Take 1 tablet by mouth daily at 12 noon.   02/25/2020 at Unknown time  . acetaminophen (TYLENOL 8 HOUR) 650 MG CR tablet Take 1 tablet (650 mg total) by mouth every 8 (eight) hours as needed for pain. 30 tablet 0   . famotidine (PEPCID) 10 MG tablet Take 10 mg by mouth 2 (two) times daily.     . ferrous sulfate (FERROUSUL) 325 (65 FE) MG tablet Take 1 tablet (325 mg total) by mouth 2 (two) times daily. (Patient not taking: Reported on 01/05/2020) 60 tablet 1   . hydrocortisone-pramoxine (PROCTOFOAM-HC) rectal foam Place 1 applicator  rectally 2 (two) times daily. (Patient taking differently: Place 1 applicator rectally as needed for hemorrhoids. ) 10 g 1   . metoCLOPramide (REGLAN) 10 MG tablet Take 1 tablet (10 mg total) by mouth every 6 (six) hours. 20 tablet 0   . scopolamine (TRANSDERM-SCOP) 1 MG/3DAYS Place 1 patch onto the skin every 3 (three) days.     Marland Kitchen terconazole (TERAZOL 3) 0.8 % vaginal cream Place 1 applicator vaginally at bedtime. 20 g 0   . valACYclovir (VALTREX) 1000 MG tablet Take 1,000 mg by mouth as needed (break out).      Marland Kitchen zolpidem (AMBIEN) 5 MG tablet Take 5 mg by mouth at bedtime as needed for sleep.        Review of Systems  Constitutional: Positive for chills. Negative for fever.  Gastrointestinal: Positive for abdominal pain, nausea and vomiting. Negative for constipation and diarrhea.  Genitourinary: Negative for vaginal bleeding.  Musculoskeletal: Positive for back pain (right).   Physical Exam   Blood pressure (!) 120/58, pulse 79, temperature 98.7 F (37.1 C), temperature source Oral, resp. rate 20, weight 72.9 kg, last menstrual period  07/06/2019, SpO2 100 %.  Physical Exam Vitals and nursing note reviewed. Exam conducted with a chaperone present.  Abdominal:     General: There is no distension.     Palpations: Abdomen is soft.     Tenderness: There is no abdominal tenderness. There is no right CVA tenderness or left CVA tenderness.  Genitourinary:    Comments: VE: closed/thick Skin:    General: Skin is warm and dry.  Neurological:     Mental Status: She is oriented to person, place, and time.     Comments: sedated  Psychiatric:        Mood and Affect: Mood normal.   EFM: 130 bpm, mod variability, + accels, occ variable decels Toco: rare  Results for orders placed or performed during the hospital encounter of 02/26/20 (from the past 24 hour(s))  Wet prep, genital     Status: Abnormal   Collection Time: 02/26/20  5:04 PM   Specimen: PATH Cytology Cervicovaginal Ancillary Only  Result Value Ref Range   Yeast Wet Prep HPF POC NONE SEEN NONE SEEN   Trich, Wet Prep NONE SEEN NONE SEEN   Clue Cells Wet Prep HPF POC NONE SEEN NONE SEEN   WBC, Wet Prep HPF POC FEW (A) NONE SEEN   Sperm NONE SEEN   Urinalysis, Routine w reflex microscopic     Status: Abnormal   Collection Time: 02/26/20  6:38 PM  Result Value Ref Range   Color, Urine YELLOW YELLOW   APPearance HAZY (A) CLEAR   Specific Gravity, Urine 1.027 1.005 - 1.030   pH 6.0 5.0 - 8.0   Glucose, UA NEGATIVE NEGATIVE mg/dL   Hgb urine dipstick NEGATIVE NEGATIVE   Bilirubin Urine NEGATIVE NEGATIVE   Ketones, ur 80 (A) NEGATIVE mg/dL   Protein, ur 30 (A) NEGATIVE mg/dL   Nitrite NEGATIVE NEGATIVE   Leukocytes,Ua TRACE (A) NEGATIVE   RBC / HPF 0-5 0 - 5 RBC/hpf   WBC, UA 0-5 0 - 5 WBC/hpf   Bacteria, UA FEW (A) NONE SEEN   Squamous Epithelial / LPF 11-20 0 - 5   Mucus PRESENT   Urine rapid drug screen (hosp performed)     Status: Abnormal   Collection Time: 02/26/20  6:38 PM  Result Value Ref Range   Opiates NONE DETECTED NONE DETECTED   Cocaine NONE  DETECTED NONE  DETECTED   Benzodiazepines NONE DETECTED NONE DETECTED   Amphetamines NONE DETECTED NONE DETECTED   Tetrahydrocannabinol POSITIVE (A) NONE DETECTED   Barbiturates NONE DETECTED NONE DETECTED  CBC     Status: Abnormal   Collection Time: 02/26/20  7:00 PM  Result Value Ref Range   WBC 12.7 (H) 4.0 - 10.5 K/uL   RBC 3.59 (L) 3.87 - 5.11 MIL/uL   Hemoglobin 9.4 (L) 12.0 - 15.0 g/dL   HCT 27.5 (L) 36 - 46 %   MCV 84.7 80.0 - 100.0 fL   MCH 26.2 26.0 - 34.0 pg   MCHC 30.9 30.0 - 36.0 g/dL   RDW 17.0 01.7 - 49.4 %   Platelets 342 150 - 400 K/uL   nRBC 0.0 0.0 - 0.2 %  Comprehensive metabolic panel     Status: Abnormal   Collection Time: 02/26/20  7:00 PM  Result Value Ref Range   Sodium 135 135 - 145 mmol/L   Potassium 3.5 3.5 - 5.1 mmol/L   Chloride 103 98 - 111 mmol/L   CO2 20 (L) 22 - 32 mmol/L   Glucose, Bld 97 70 - 99 mg/dL   BUN 7 6 - 20 mg/dL   Creatinine, Ser 4.96 0.44 - 1.00 mg/dL   Calcium 9.1 8.9 - 75.9 mg/dL   Total Protein 7.1 6.5 - 8.1 g/dL   Albumin 3.1 (L) 3.5 - 5.0 g/dL   AST 17 15 - 41 U/L   ALT 12 0 - 44 U/L   Alkaline Phosphatase 102 38 - 126 U/L   Total Bilirubin 0.9 0.3 - 1.2 mg/dL   GFR calc non Af Amer >60 >60 mL/min   GFR calc Af Amer >60 >60 mL/min   Anion gap 12 5 - 15  Lipase, blood     Status: None   Collection Time: 02/26/20  7:00 PM  Result Value Ref Range   Lipase 23 11 - 51 U/L   MAU Course  Procedures Meds ordered this encounter  Medications  . lactated ringers bolus 1,000 mL  . famotidine (PEPCID) IVPB 20 mg premix  . ondansetron (ZOFRAN) injection 4 mg  . lactated ringers bolus 1,000 mL   MDM Labs and BPP ordered. BPP 8/8, AFI 5.9cm. Few variables on fetal tracing, consult with Dr. Shawnie Pons, reviewed tracing, give a second liter of LR, ok for discharge if no further decels and tolerating po.  Transfer of care given to Rosalita Levan, CNM  02/26/2020 8:21 PM   Care taken over and introduced self to patient   Reassessment @ 2115, patient returned from restroom after emesis. LR IV bolus #3 ordered in addition to 10mg  of Compazine IV. Tracing reviewed and one variable noted at 2158.   Reassessment @ 2250, patient reports feeling a lot better, "just tired", patient able to tolerate PO. Reviewed fetal tracing and no decelerations noted since completion of 3rd bag of LR and over the past hour.   Discussed reasons to return to MAU. Follow up as scheduled in the office, encouraged to call on Monday to discuss when appointment is d/t patient not remembering at this time. Return to MAU as needed. Pt stable at time of discharge. NST reactive and reassuring for gestational age.   Assessment and Plan   1. Nausea and vomiting during pregnancy   2. Decreased fetal movement   3. Dehydration   4. Decreased fetal movement affecting management of pregnancy in third trimester, single or unspecified fetus    Discharge home  Follow up as scheduled in the office for prenatal care Return to MAU as needed for reasons discussed and/or emergencies  Hydration and FKC  Warning signs reviewed and reasons to present to MAU  Continue medications at home for N/V    Follow-up Information    [facility], [place]. Schedule an appointment as soon as possible for a visit.   Specialty: Obstetrics and Gynecology Why: Make appointment to be seen in the office early next week  Contact information: [address] [place] [address] [phone]              Allergies as of 02/26/2020   No Known Allergies     Medication List    STOP taking these medications   terconazole 0.8 % vaginal cream Commonly known as: TERAZOL 3     TAKE these medications   acetaminophen 650 MG CR tablet Commonly known as: Tylenol 8 Hour Take 1 tablet (650 mg total) by mouth every 8 (eight) hours as needed for pain.   famotidine 10 MG tablet Commonly known as: PEPCID Take 10 mg by mouth 2 (two) times daily.    ferrous sulfate 325 (65 FE) MG tablet Commonly known as: FerrouSul Take 1 tablet (325 mg total) by mouth 2 (two) times daily.   hydrocortisone-pramoxine rectal foam Commonly known as: PROCTOFOAM-HC Place 1 applicator rectally 2 (two) times daily. What changed:   when to take this  reasons to take this   metoCLOPramide 10 MG tablet Commonly known as: REGLAN Take 1 tablet (10 mg total) by mouth every 6 (six) hours.   ondansetron 4 MG tablet Commonly known as: ZOFRAN Take 4 mg by mouth every 8 (eight) hours as needed for nausea or vomiting.   prenatal multivitamin Tabs tablet Take 1 tablet by mouth daily at 12 noon.   scopolamine 1 MG/3DAYS Commonly known as: TRANSDERM-SCOP Place 1 patch onto the skin every 3 (three) days.   valACYclovir 1000 MG tablet Commonly known as: VALTREX Take 1,000 mg by mouth as needed (break out).   zolpidem 5 MG tablet Commonly known as: AMBIEN Take 5 mg by mouth at bedtime as needed for sleep.      Sharyon Cable, CNM 02/27/20, 12:14 AM

## 2020-02-27 ENCOUNTER — Encounter (HOSPITAL_COMMUNITY): Payer: Self-pay | Admitting: Obstetrics & Gynecology

## 2020-02-27 ENCOUNTER — Inpatient Hospital Stay (HOSPITAL_BASED_OUTPATIENT_CLINIC_OR_DEPARTMENT_OTHER): Payer: Medicaid Other

## 2020-02-27 ENCOUNTER — Inpatient Hospital Stay (HOSPITAL_COMMUNITY)
Admission: AD | Admit: 2020-02-27 | Discharge: 2020-02-27 | Discharge status: Against Medical Advice | Payer: Medicaid Other | Attending: Obstetrics and Gynecology | Admitting: Obstetrics and Gynecology

## 2020-02-27 DIAGNOSIS — G8929 Other chronic pain: Secondary | ICD-10-CM | POA: Insufficient documentation

## 2020-02-27 DIAGNOSIS — R1110 Vomiting, unspecified: Secondary | ICD-10-CM | POA: Diagnosis not present

## 2020-02-27 DIAGNOSIS — M549 Dorsalgia, unspecified: Secondary | ICD-10-CM | POA: Diagnosis not present

## 2020-02-27 DIAGNOSIS — N898 Other specified noninflammatory disorders of vagina: Secondary | ICD-10-CM | POA: Diagnosis not present

## 2020-02-27 DIAGNOSIS — O99323 Drug use complicating pregnancy, third trimester: Secondary | ICD-10-CM | POA: Diagnosis present

## 2020-02-27 DIAGNOSIS — Z3A33 33 weeks gestation of pregnancy: Secondary | ICD-10-CM

## 2020-02-27 DIAGNOSIS — O368190 Decreased fetal movements, unspecified trimester, not applicable or unspecified: Secondary | ICD-10-CM

## 2020-02-27 DIAGNOSIS — Z3689 Encounter for other specified antenatal screening: Secondary | ICD-10-CM

## 2020-02-27 DIAGNOSIS — Z87440 Personal history of urinary (tract) infections: Secondary | ICD-10-CM | POA: Diagnosis not present

## 2020-02-27 DIAGNOSIS — O26893 Other specified pregnancy related conditions, third trimester: Secondary | ICD-10-CM | POA: Diagnosis not present

## 2020-02-27 DIAGNOSIS — K219 Gastro-esophageal reflux disease without esophagitis: Secondary | ICD-10-CM | POA: Diagnosis not present

## 2020-02-27 DIAGNOSIS — O368130 Decreased fetal movements, third trimester, not applicable or unspecified: Secondary | ICD-10-CM | POA: Diagnosis not present

## 2020-02-27 DIAGNOSIS — F12188 Cannabis abuse with other cannabis-induced disorder: Secondary | ICD-10-CM

## 2020-02-27 DIAGNOSIS — O99613 Diseases of the digestive system complicating pregnancy, third trimester: Secondary | ICD-10-CM | POA: Diagnosis not present

## 2020-02-27 DIAGNOSIS — O359XX0 Maternal care for (suspected) fetal abnormality and damage, unspecified, not applicable or unspecified: Secondary | ICD-10-CM

## 2020-02-27 DIAGNOSIS — Z5329 Procedure and treatment not carried out because of patient's decision for other reasons: Secondary | ICD-10-CM | POA: Insufficient documentation

## 2020-02-27 DIAGNOSIS — Z87891 Personal history of nicotine dependence: Secondary | ICD-10-CM | POA: Insufficient documentation

## 2020-02-27 DIAGNOSIS — O99891 Other specified diseases and conditions complicating pregnancy: Secondary | ICD-10-CM | POA: Diagnosis not present

## 2020-02-27 DIAGNOSIS — F1210 Cannabis abuse, uncomplicated: Secondary | ICD-10-CM | POA: Diagnosis not present

## 2020-02-27 LAB — URINALYSIS, ROUTINE W REFLEX MICROSCOPIC
Bilirubin Urine: NEGATIVE
Glucose, UA: NEGATIVE mg/dL
Hgb urine dipstick: NEGATIVE
Ketones, ur: 80 mg/dL — AB
Nitrite: NEGATIVE
Protein, ur: 30 mg/dL — AB
Specific Gravity, Urine: 1.02 (ref 1.005–1.030)
pH: 6 (ref 5.0–8.0)

## 2020-02-27 LAB — WET PREP, GENITAL
Clue Cells Wet Prep HPF POC: NONE SEEN
Sperm: NONE SEEN
Trich, Wet Prep: NONE SEEN
Yeast Wet Prep HPF POC: NONE SEEN

## 2020-02-27 LAB — AMNISURE RUPTURE OF MEMBRANE (ROM) NOT AT ARMC: Amnisure ROM: NEGATIVE

## 2020-02-27 MED ORDER — ONDANSETRON HCL 4 MG PO TABS
8.0000 mg | ORAL_TABLET | Freq: Once | ORAL | Status: AC
  Administered 2020-02-27: 8 mg via ORAL
  Filled 2020-02-27: qty 2

## 2020-02-27 MED ORDER — PANTOPRAZOLE SODIUM 40 MG PO TBEC
40.0000 mg | DELAYED_RELEASE_TABLET | Freq: Once | ORAL | Status: AC
  Administered 2020-02-27: 40 mg via ORAL
  Filled 2020-02-27: qty 1

## 2020-02-27 NOTE — MAU Note (Signed)
Patient requesting medication for heart burn and to go home. Declines further monitoring at this time. Donia Ast NP in dept and made aware.

## 2020-02-27 NOTE — MAU Note (Signed)
Pt signed AMA form.

## 2020-02-27 NOTE — MAU Note (Signed)
Melanie Hancock is a 24 y.o. at [redacted]w[redacted]d here in MAU reporting: ongoing nausea, vomiting, headache, and back pain. Unsure about how many times she has vomited since she was here yesterday. No vaginal bleeding or LOF. DFM.  Onset of complaint: ongoing  Pain score: headache 7/10, back pain 10/10  Vitals:   02/27/20 1439  BP: (!) 126/56  Pulse: (!) 108  Resp: 16  Temp: 98.7 F (37.1 C)  SpO2: 100%     FHT:136  Lab orders placed from triage: UA

## 2020-02-27 NOTE — MAU Provider Note (Signed)
History     CSN: [id number]  Arrival date and time: 02/27/20 1410   First Provider Initiated Contact with Patient 02/27/20 1611      Chief Complaint  Patient presents with  . Back Pain  . Nausea  . Emesis  . Headache  . Decreased Fetal Movement   Melanie Hancock is a 24 y.o. [phone] at [redacted]w[redacted]d who presents to MAU for "still feeling bad from yesterday when I was here." Patient reports she was seen in MAU yesterday and discharged around midnight. Patient reports continued nausea and vomiting. Patient reports vomiting x6-7 since discharge last night. Upon arrival to room, patient sleeping and resting comfortably. Patient brought water with her to MAU and is able to drink in front of provider without vomiting and is requesting a nurse bring her ice water as she would like something colder. Patient reports she has not eaten anything since last night.  Patient reports she has Zofran, Scopolamine, Reglan and Pepcid, but is only using Scopolamine and Zofran. Last took Zofran 2 days ago. Patient reports she is not taking Pepcid because she ran out and is now taking Protonix because that's what she has at home but is only taking it "as needed." Patient reports she is not taking her Zofran around the clock because "I started getting better." Patient reports she is using Scopolamine continuously and changing it every 3 days. Patient reports the patch she is currently using is 49 days old. Patient reports she is not taking the Reglan because she was told that was "just for spitting." Patient reports she does have all the medications at home that she was prescribed back in December.  Patient also reporting DFM, which was a concern yesterday during her MAU visit. Patient reports movement is still decreased from yesterday and has not returned to normal.  Patient also reporting white, thin vaginal discharge since arriving to MAU that is "running down my legs." Patient reports discharge is without odor or  itching.  Pt denies VB, LOF, ctx, vaginal odor/itching. Pt denies abdominal pain, constipation, diarrhea, or urinary problems. Pt denies fever, chills, fatigue, sweating or changes in appetite. Pt denies SOB or chest pain. Pt denies dizziness, HA, light-headedness, weakness.  Problems this pregnancy include: cannabis hyperemesis. Allergies? NKDA Current medications/supplements? Zofran, Scopolamine, Reglan, Pepcid, Protonix, PNVs, valacyclovir PRN Prenatal care provider? [facility], next appt 02/29/2020   OB History    Gravida  5   Para  3   Term  3   Preterm      AB  1   Living  3     SAB  0   TAB  1   Ectopic      Multiple  0   Live Births  3           Past Medical History:  Diagnosis Date  . Anemia   . Anxiety   . Blood transfusion without reported diagnosis   . Chronic back pain   . Depression   . GERD (gastroesophageal reflux disease)   . Infection    UTI  . Post partum depression   . Pyelonephritis   . UTI (lower urinary tract infection)     Past Surgical History:  Procedure Laterality Date  . DILATION AND CURETTAGE OF UTERUS    . INDUCED ABORTION    . WISDOM TOOTH EXTRACTION      Family History  Problem Relation Age of Onset  . Arthritis Mother   . Healthy Father   .  Arthritis Maternal Grandmother   . Asthma Son   . Alcohol abuse Neg Hx     Social History   Tobacco Use  . Smoking status: Former Smoker    Quit date: 01/21/2014    Years since quitting: 6.1  . Smokeless tobacco: Never Used  Vaping Use  . Vaping Use: Former  . Substances: Nicotine, Flavoring  Substance Use Topics  . Alcohol use: Not Currently    Comment: rarely  . Drug use: Not Currently    Types: Marijuana    Comment: 2-3 weeks ago    Allergies: No Known Allergies  No medications prior to admission.    Review of Systems  Constitutional: Negative for chills, diaphoresis, fatigue and fever.  Eyes: Negative for visual disturbance.  Respiratory: Negative for  shortness of breath.   Cardiovascular: Negative for chest pain.  Gastrointestinal: Positive for nausea and vomiting. Negative for abdominal pain, constipation and diarrhea.  Genitourinary: Positive for vaginal discharge. Negative for dysuria, flank pain, frequency, pelvic pain, urgency and vaginal bleeding.  Neurological: Negative for dizziness, weakness, light-headedness and headaches.   Physical Exam   Blood pressure (!) 126/56, pulse (!) 108, temperature 98.7 F (37.1 C), resp. rate 16, height 5' 5.5" (1.664 m), weight 73.1 kg, last menstrual period 07/06/2019, SpO2 100 %.  Patient Vitals for the past 24 hrs:  BP Temp Pulse Resp SpO2 Height Weight  02/27/20 1439 (!) 126/56 98.7 F (37.1 C) (!) 108 16 100 % -- --  02/27/20 1421 -- -- -- -- -- 5' 5.5" (1.664 m) 73.1 kg   Physical Exam Exam conducted with a chaperone present.  Constitutional:      General: She is not in acute distress.    Appearance: She is well-developed and normal weight. She is not ill-appearing, toxic-appearing or diaphoretic.  HENT:     Head: Normocephalic and atraumatic.  Pulmonary:     Effort: Pulmonary effort is normal.  Abdominal:     General: There is no distension.     Palpations: Abdomen is soft. There is no mass.     Tenderness: There is no abdominal tenderness. There is no guarding or rebound.  Genitourinary:    General: Normal vulva.     Labia:        Right: No rash, tenderness or lesion.        Left: No rash, tenderness or lesion.      Comments: No fluid present on vulva/labia/legs. Skin:    General: Skin is warm and dry.  Neurological:     Mental Status: She is alert and oriented to person, place, and time.  Psychiatric:        Behavior: Behavior normal.        Thought Content: Thought content normal.        Judgment: Judgment normal.    Orders Placed This Encounter  Procedures  . Wet prep, genital    Standing Status:   Standing    Number of Occurrences:   1  . Culture, OB Urine     Standing Status:   Standing    Number of Occurrences:   1  . [place] MFM FETAL BPP WO NON STRESS    Standing Status:   Standing    Number of Occurrences:   1    Order Specific Question:   Symptom/Reason for Exam    Answer:   Decreased fetal movement [[id number]]  . Urinalysis, Routine w reflex microscopic    Standing Status:   Standing  Number of Occurrences:   1  . Amnisure rupture of membrane (rom)not at [facility]    Standing Status:   Standing    Number of Occurrences:   1   Meds ordered this encounter  Medications  . ondansetron (ZOFRAN) tablet 8 mg  . pantoprazole (PROTONIX) EC tablet 40 mg    MAU Course  Procedures  MDM -N/V in pregnancy -pt able to drink water without vomiting prior to administration of medication and requesting ice water in MAU -weight today 73.1kg, initial weight in MAU 71.85kg, weight gain on 1.25kg this pregnancy -UA: hazy/80 ketones/30PRO/trace leuks; patient received 3L of fluid in MAU within past 24 hours, sending urine for culture -CBC (performed within past 24 hours): WBCs 12.7, H/H 9.4/30.4 -CMP (performed within past 24 hours): WNL -UDS: (performed within past 24 hours): +THC -Zofran 8mg  PO given -patient now reporting heartburn, requesting "the cocktail" but does not want to stay and wishes to leave  -vaginal discharge while in MAU -AmniSure: negative -WetPrep: WNL -GC/CT collected  -DFM -EFM: reactive       -baseline: 135/140       -variability: moderate       -accels: present, 15x15       -decels: single variable       -TOCO: irritability -BPP ordered, 6/8 -per RN, pt refusing to be placed back on monitor after return from ultrasound -consulted with Dr. 8/8, advised to give Mayford Knife D5LR, as patient has not eaten anything today and reassess fetal movement after that time. If normal fetal movement, pt is OK to be discharged home with a review of fetal kick counting. -advised patient of plan, patient reports she can feel baby moving and does  not believe she needs fluids and wishes to be discharged home. Patient advised that her treatment and assessment of fetal movement is not yet completed and if she wishes to go home she will need to leave AMA and understands that we cannot completely guarantee her wellbeing or fetal wellbeing without a full evaluation. Patient understands and was given time to consider staying for completion of evaluation vs. Leaving AMA and elects to leave AMA. -pt leaving AMA  Orders Placed This Encounter  Procedures  . Wet prep, genital    Standing Status:   Standing    Number of Occurrences:   1  . Culture, OB Urine    Standing Status:   Standing    Number of Occurrences:   1  . MFM FETAL BPP WO NON STRESS    Standing Status:   Standing    Number of Occurrences:   1    Order Specific Question:   Symptom/Reason for Exam    Answer:   Decreased fetal movement [place]  . Urinalysis, Routine w reflex microscopic    Standing Status:   Standing    Number of Occurrences:   1  . Amnisure rupture of membrane (rom)not at [facility]    Standing Status:   Standing    Number of Occurrences:   1   Meds ordered this encounter  Medications  . ondansetron (ZOFRAN) tablet 8 mg  . pantoprazole (PROTONIX) EC tablet 40 mg    Assessment and Plan   1. Cannabis hyperemesis syndrome concurrent with and due to cannabis abuse (HCC)   2. Decreased fetal movement   3. [redacted] weeks gestation of pregnancy   4. NST (non-stress test) reactive   5. Vaginal discharge during pregnancy in third trimester    -pt left AMA  Odie Seraicole E Afifa Truax 02/27/2020, 7:32 PM

## 2020-02-29 LAB — GC/CHLAMYDIA PROBE AMP (~~LOC~~) NOT AT ARMC
Chlamydia: NEGATIVE
Chlamydia: NEGATIVE
Comment: NEGATIVE
Comment: NORMAL
Comment: NEGATIVE
Comment: NORMAL
Neisseria Gonorrhea: NEGATIVE
Neisseria Gonorrhea: NEGATIVE

## 2020-02-29 LAB — CULTURE, OB URINE: Culture: 50000 — AB

## 2020-03-03 ENCOUNTER — Encounter (HOSPITAL_COMMUNITY): Payer: Self-pay | Admitting: Obstetrics & Gynecology

## 2020-03-03 ENCOUNTER — Inpatient Hospital Stay (HOSPITAL_BASED_OUTPATIENT_CLINIC_OR_DEPARTMENT_OTHER): Payer: Medicaid Other

## 2020-03-03 ENCOUNTER — Inpatient Hospital Stay (HOSPITAL_COMMUNITY)
Admission: AD | Admit: 2020-03-03 | Discharge: 2020-03-03 | Disposition: A | Discharge status: Home / Self Care | Payer: Medicaid Other | Attending: Obstetrics & Gynecology | Admitting: Obstetrics & Gynecology

## 2020-03-03 ENCOUNTER — Other Ambulatory Visit: Payer: Self-pay

## 2020-03-03 DIAGNOSIS — R519 Headache, unspecified: Secondary | ICD-10-CM | POA: Diagnosis not present

## 2020-03-03 DIAGNOSIS — O368130 Decreased fetal movements, third trimester, not applicable or unspecified: Secondary | ICD-10-CM | POA: Insufficient documentation

## 2020-03-03 DIAGNOSIS — K219 Gastro-esophageal reflux disease without esophagitis: Secondary | ICD-10-CM | POA: Diagnosis not present

## 2020-03-03 DIAGNOSIS — O99283 Endocrine, nutritional and metabolic diseases complicating pregnancy, third trimester: Secondary | ICD-10-CM | POA: Insufficient documentation

## 2020-03-03 DIAGNOSIS — Z87440 Personal history of urinary (tract) infections: Secondary | ICD-10-CM | POA: Diagnosis not present

## 2020-03-03 DIAGNOSIS — O210 Mild hyperemesis gravidarum: Secondary | ICD-10-CM

## 2020-03-03 DIAGNOSIS — R102 Pelvic and perineal pain: Secondary | ICD-10-CM | POA: Diagnosis not present

## 2020-03-03 DIAGNOSIS — M549 Dorsalgia, unspecified: Secondary | ICD-10-CM | POA: Insufficient documentation

## 2020-03-03 DIAGNOSIS — O26893 Other specified pregnancy related conditions, third trimester: Secondary | ICD-10-CM | POA: Insufficient documentation

## 2020-03-03 DIAGNOSIS — E876 Hypokalemia: Secondary | ICD-10-CM | POA: Diagnosis not present

## 2020-03-03 DIAGNOSIS — Z3A34 34 weeks gestation of pregnancy: Secondary | ICD-10-CM

## 2020-03-03 DIAGNOSIS — O289 Unspecified abnormal findings on antenatal screening of mother: Secondary | ICD-10-CM

## 2020-03-03 DIAGNOSIS — O99613 Diseases of the digestive system complicating pregnancy, third trimester: Secondary | ICD-10-CM | POA: Insufficient documentation

## 2020-03-03 DIAGNOSIS — O212 Late vomiting of pregnancy: Secondary | ICD-10-CM | POA: Insufficient documentation

## 2020-03-03 DIAGNOSIS — Z87891 Personal history of nicotine dependence: Secondary | ICD-10-CM | POA: Insufficient documentation

## 2020-03-03 DIAGNOSIS — Z3689 Encounter for other specified antenatal screening: Secondary | ICD-10-CM

## 2020-03-03 DIAGNOSIS — O359XX0 Maternal care for (suspected) fetal abnormality and damage, unspecified, not applicable or unspecified: Secondary | ICD-10-CM | POA: Insufficient documentation

## 2020-03-03 DIAGNOSIS — O288 Other abnormal findings on antenatal screening of mother: Secondary | ICD-10-CM

## 2020-03-03 LAB — URINALYSIS, ROUTINE W REFLEX MICROSCOPIC
Glucose, UA: NEGATIVE mg/dL
Hgb urine dipstick: NEGATIVE
Ketones, ur: 80 mg/dL — AB
Leukocytes,Ua: NEGATIVE
Nitrite: NEGATIVE
Protein, ur: 100 mg/dL — AB
Specific Gravity, Urine: 1.021 (ref 1.005–1.030)
pH: 6 (ref 5.0–8.0)

## 2020-03-03 LAB — BASIC METABOLIC PANEL
Anion gap: 16 — ABNORMAL HIGH (ref 5–15)
BUN: 6 mg/dL (ref 6–20)
CO2: 17 mmol/L — ABNORMAL LOW (ref 22–32)
Calcium: 9.3 mg/dL (ref 8.9–10.3)
Chloride: 100 mmol/L (ref 98–111)
Creatinine, Ser: 0.67 mg/dL (ref 0.44–1.00)
GFR calc Af Amer: >60 mL/min (ref >60)
GFR calc non Af Amer: >60 mL/min (ref >60)
Glucose, Bld: 81 mg/dL (ref 70–99)
Potassium: 2.9 mmol/L — ABNORMAL LOW (ref 3.5–5.1)
Sodium: 133 mmol/L — ABNORMAL LOW (ref 135–145)

## 2020-03-03 MED ORDER — LACTATED RINGERS IV BOLUS
1000.0000 mL | Freq: Once | INTRAVENOUS | Status: AC
  Administered 2020-03-03: 1000 mL via INTRAVENOUS

## 2020-03-03 MED ORDER — CYCLOBENZAPRINE HCL 5 MG PO TABS
5.0000 mg | ORAL_TABLET | Freq: Once | ORAL | Status: AC
  Administered 2020-03-03: 5 mg via ORAL
  Filled 2020-03-03: qty 1

## 2020-03-03 MED ORDER — PROMETHAZINE HCL 25 MG/ML IJ SOLN
25.0000 mg | Freq: Once | INTRAMUSCULAR | Status: AC
  Administered 2020-03-03: 25 mg via INTRAVENOUS
  Filled 2020-03-03: qty 1

## 2020-03-03 MED ORDER — FAMOTIDINE IN NACL 20-0.9 MG/50ML-% IV SOLN
20.0000 mg | Freq: Once | INTRAVENOUS | Status: AC
  Administered 2020-03-03: 20 mg via INTRAVENOUS
  Filled 2020-03-03: qty 50

## 2020-03-03 MED ORDER — POTASSIUM CHLORIDE 10 MEQ/100ML IV SOLN
10.0000 meq | INTRAVENOUS | Status: AC
  Administered 2020-03-03 (×3): 10 meq via INTRAVENOUS
  Filled 2020-03-03 (×3): qty 100

## 2020-03-03 MED ORDER — LACTATED RINGERS IV SOLN: INTRAVENOUS | Status: DC

## 2020-03-03 NOTE — Progress Notes (Signed)
CSW consulted due to pt having numerous of visits to MAU and ED as well as pt mentioning "wanting this to all be over". CSW went to speak with pt to gather more information on needs at this time.   CSW introduced role to pt and advised pt of the reason for CSW coming to speak with her. Pt reported that she has dealt with hyperemesis her entire pregnancy. Pt reported an inability to keep food down and reports that she vomits "a lot". Pt reports that this has been a pattern for her during her entire pregnancy. Pt reports that she has been given medications for her sickness but sometimes they don't work well for her. CSW inquired from pt on what works well for her. Pt reported that she used THC to help with her sickness at times. Pt expressed that she uses in order to gain an appetite "so that I don't loose to much weight". Pt reported  that she has had plans to stop using THC but nothing else seem to work for her. CSW understanding but also advised pt of the results if she keeps using THC and infant is positive. Pt reported that she has been trying to stop as "I don't want her to be positive" (referring to infant). CSW encouraged pt to do so and take medications as prescribed by MD for sickness, Pt verbalized understanding of this.   CSW asked pt about other needs in which Pt reported that she has pregnancy case managers that have been very helpful in ensuring that she has all items for new infant. PT reported that she is not  working at this time. CSW inquired on ability to afford medication however pt reports that she has the ability top obtain medications as needed. CSW understanding and reported to pt that CSW would back up with her once infant is born.Pt expressed other needs to this CSW.     Claude Manges Melanie Hancock, MSW, LCSW [facility] [phone]

## 2020-03-03 NOTE — MAU Note (Signed)
Melanie Hancock from CSW regarding order that is entered for a consult, states she will come see patient.

## 2020-03-03 NOTE — MAU Note (Signed)
Pt states currently wearing scopalamine patch and took Protonix and zofran yesterday

## 2020-03-03 NOTE — Discharge Instructions (Signed)
Hyperemesis Gravidarum Hyperemesis gravidarum is a severe form of nausea and vomiting that happens during pregnancy. Hyperemesis is worse than morning sickness. It may cause you to have nausea or vomiting all day for many days. It may keep you from eating and drinking enough food and liquids, which can lead to dehydration, malnutrition, and weight loss. Hyperemesis usually occurs during the first half (the first 20 weeks) of pregnancy. It often goes away once a woman is in her second half of pregnancy. However, sometimes hyperemesis continues through an entire pregnancy. What are the causes? The cause of this condition is not known. It may be related to changes in chemicals (hormones) in the body during pregnancy, such as the high level of pregnancy hormone (human chorionic gonadotropin) or the increase in the female sex hormone (estrogen). What are the signs or symptoms? Symptoms of this condition include:  Nausea that does not go away.  Vomiting that does not allow you to keep any food down.  Weight loss.  Body fluid loss (dehydration).  Having no desire to eat, or not liking food that you have previously enjoyed. How is this diagnosed? This condition may be diagnosed based on:  A physical exam.  Your medical history.  Your symptoms.  Blood tests.  Urine tests. How is this treated? This condition is managed by controlling symptoms. This may include:  Following an eating plan. This can help lessen nausea and vomiting.  Taking prescription medicines. An eating plan and medicines are often used together to help control symptoms. If medicines do not help relieve nausea and vomiting, you may need to receive fluids through an IV at the hospital. Follow these instructions at home: Eating and drinking   Avoid the following: ? Drinking fluids with meals. Try not to drink anything during the 30 minutes before and after your meals. ? Drinking more than 1 cup of fluid at a  time. ? Eating foods that trigger your symptoms. These may include spicy foods, coffee, high-fat foods, very sweet foods, and acidic foods. ? Skipping meals. Nausea can be more intense on an empty stomach. If you cannot tolerate food, do not force it. Try sucking on ice chips or other frozen items and make up for missed calories later. ? Lying down within 2 hours after eating. ? Being exposed to environmental triggers. These may include food smells, smoky rooms, closed spaces, rooms with strong smells, warm or humid places, overly loud and noisy rooms, and rooms with motion or flickering lights. Try eating meals in a well-ventilated area that is free of strong smells. ? Quick and sudden changes in your movement. ? Taking iron pills and multivitamins that contain iron. If you take prescription iron pills, do not stop taking them unless your health care provider approves. ? Preparing food. The smell of food can spoil your appetite or trigger nausea.  To help relieve your symptoms: ? Listen to your body. Everyone is different and has different preferences. Find what works best for you. ? Eat and drink slowly. ? Eat 5-6 small meals daily instead of 3 large meals. Eating small meals and snacks can help you avoid an empty stomach. ? In the morning, before getting out of bed, eat a couple of crackers to avoid moving around on an empty stomach. ? Try eating starchy foods as these are usually tolerated well. Examples include cereal, toast, bread, potatoes, pasta, rice, and pretzels. ? Include at least 1 serving of protein with your meals and snacks. Protein options include   lean meats, poultry, seafood, beans, nuts, nut butters, eggs, cheese, and yogurt. ? Try eating a protein-rich snack before bed. Examples of a protein-rick snack include cheese and crackers or a peanut butter sandwich made with 1 slice of whole-wheat bread and 1 tsp (5 g) of peanut butter. ? Eat or suck on things that have ginger in them.  It may help relieve nausea. Add  tsp ground ginger to hot tea or choose ginger tea. ? Try drinking 100% fruit juice or an electrolyte drink. An electrolyte drink contains sodium, potassium, and chloride. ? Drink fluids that are cold, clear, and carbonated or sour. Examples include lemonade, ginger ale, lemon-lime soda, ice water, and sparkling water. ? Brush your teeth or use a mouth rinse after meals. ? Talk with your health care provider about starting a supplement of vitamin B6. General instructions  Take over-the-counter and prescription medicines only as told by your health care provider.  Follow instructions from your health care provider about eating or drinking restrictions.  Continue to take your prenatal vitamins as told by your health care provider. If you are having trouble taking your prenatal vitamins, talk with your health care provider about different options.  Keep all follow-up and pre-birth (prenatal) visits as told by your health care provider. This is important. Contact a health care provider if:  You have pain in your abdomen.  You have a severe headache.  You have vision problems.  You are losing weight.  You feel weak or dizzy. Get help right away if:  You cannot drink fluids without vomiting.  You vomit blood.  You have constant nausea and vomiting.  You are very weak.  You faint.  You have a fever and your symptoms suddenly get worse. Summary  Hyperemesis gravidarum is a severe form of nausea and vomiting that happens during pregnancy.  Making some changes to your eating habits may help relieve nausea and vomiting.  This condition may be managed with medicine.  If medicines do not help relieve nausea and vomiting, you may need to receive fluids through an IV at the hospital. This information is not intended to replace advice given to you by your health care provider. Make sure you discuss any questions you have with your health care  provider. Document Revised: 08/26/2017 Document Reviewed: 04/04/2016 Elsevier Patient Education  2020 [employer]  

## 2020-03-03 NOTE — MAU Provider Note (Signed)
Chief Complaint:  Emesis   First Provider Initiated Contact with Patient 03/03/20 1305     HPI: Melanie Hancock is a 24 y.o. [id number] at [redacted]w[redacted]d who presents to maternity admissions reporting nausea, generalized pain + pelvic pain with movement, and decreased fetal movement. Reports that she has not eaten in 2wks, but denies vomiting today. Stated "can we just induce my baby today I don't want to be pregnant anymore."  Denies vaginal bleeding, leaking of fluid, cramping or contractions, fever, falls, or recent illness.   Pregnancy Course: Numerous visits to MAU for hyperemesis, pain, headaches, and GERD  Past Medical History:  Diagnosis Date   Anemia    Anxiety    Blood transfusion without reported diagnosis    Chronic back pain    Depression    GERD (gastroesophageal reflux disease)    Infection    UTI   Post partum depression    Pyelonephritis    UTI (lower urinary tract infection)    OB History  Gravida Para Term Preterm AB Living  5 3 3   1 3   SAB TAB Ectopic Multiple Live Births  0 1   0 3    # Outcome Date GA Lbr Len/2nd Weight Sex Delivery Anes PTL Lv  5 Current           4 Term 02/04/16 [redacted]w[redacted]d 56:55 / 00:15 8 lb 4.1 oz (3.745 kg) M Vag-Spont EPI  LIV  3 Term 11/20/14 [redacted]w[redacted]d 01:14 / 00:08 7 lb 3 oz (3.26 kg) M Vag-Spont None  LIV  2 Term 02/11/10    M Vag-Spont   LIV  1 TAB              Birth Comments: System Generated. Please review and update pregnancy details.   Past Surgical History:  Procedure Laterality Date   DILATION AND CURETTAGE OF UTERUS     INDUCED ABORTION     WISDOM TOOTH EXTRACTION     Family History  Problem Relation Age of Onset   Arthritis Mother    Healthy Father    Arthritis Maternal Grandmother    Asthma Son    Alcohol abuse Neg Hx    Social History   Tobacco Use   Smoking status: Former Smoker    Quit date: 01/21/2014    Years since quitting: 6.1   Smokeless tobacco: Never Used  Vaping Use   Vaping Use: Former    Substances: Nicotine, Flavoring  Substance Use Topics   Alcohol use: Not Currently    Comment: rarely   Drug use: Not Currently    Types: Marijuana    Comment: 2-3 weeks ago   No Known Allergies No medications prior to admission.    I have reviewed patient's Past Medical Hx, Surgical Hx, Family Hx, Social Hx, medications and allergies.   ROS:  Review of Systems  Constitutional: Positive for fatigue. Negative for chills, diaphoresis and fever.  Eyes: Negative for photophobia and visual disturbance.  Respiratory: Negative for chest tightness and shortness of breath.   Cardiovascular: Negative for chest pain and palpitations.  Gastrointestinal: Positive for nausea and vomiting. Negative for abdominal pain, constipation and diarrhea.  Genitourinary: Positive for pelvic pain. Negative for vaginal bleeding and vaginal discharge.  Musculoskeletal: Positive for arthralgias and myalgias.  Neurological: Negative for dizziness, syncope, light-headedness and headaches.  Psychiatric/Behavioral: The patient is nervous/anxious.   All other systems reviewed and are negative.   Physical Exam   Patient Vitals for the past 24 hrs:  BP  Temp Temp src Pulse Resp SpO2 Height Weight  03/03/20 1911 119/71 -- -- (!) 107 19 -- -- --  03/03/20 1607 111/67 -- -- (!) 110 -- -- -- --  03/03/20 1535 140/71 98.1 F (36.7 C) Oral (!) 115 16 100 % -- --  03/03/20 1246 -- -- -- (!) 127 -- 100 % -- --  03/03/20 1227 -- -- -- -- -- -- -- 150 lb 11.2 oz (68.4 kg)  03/03/20 1224 114/67 98.3 F (36.8 C) -- (!) 114 18 -- 5' 5.5" (1.664 m) --    Constitutional: Well-developed, well-nourished female in no acute distress.  Cardiovascular: normal rate & rhythm, no murmur Respiratory: normal effort, lung sounds clear throughout GI: Abd soft, non-tender, gravid appropriate for gestational age. Pos BS x 4 MS: Extremities nontender, no edema, normal ROM Neurologic: Alert and oriented x 4.  Pelvic exam  deferred  Fetal Tracing @ 1300 Baseline: 130 Variability: moderate Accelerations: present Decelerations: variables Toco: none  Fetal Tracing @ 1400 (bedside BPP ordered)  Baseline: 130 Variability: mild Accelerations: none Decelerations: variables Toco: none    Labs: Results for orders placed or performed during the hospital encounter of 03/03/20 (from the past 24 hour(s))  Urinalysis, Routine w reflex microscopic     Status: Abnormal   Collection Time: 03/03/20 12:37 PM  Result Value Ref Range   Color, Urine AMBER (A) YELLOW   APPearance HAZY (A) CLEAR   Specific Gravity, Urine 1.021 1.005 - 1.030   pH 6.0 5.0 - 8.0   Glucose, UA NEGATIVE NEGATIVE mg/dL   Hgb urine dipstick NEGATIVE NEGATIVE   Bilirubin Urine SMALL (A) NEGATIVE   Ketones, ur 80 (A) NEGATIVE mg/dL   Protein, ur 191 (A) NEGATIVE mg/dL   Nitrite NEGATIVE NEGATIVE   Leukocytes,Ua NEGATIVE NEGATIVE   RBC / HPF 0-5 0 - 5 RBC/hpf   WBC, UA 0-5 0 - 5 WBC/hpf   Bacteria, UA RARE (A) NONE SEEN   Squamous Epithelial / LPF 6-10 0 - 5   Mucus PRESENT    Hyaline Casts, UA PRESENT   Basic metabolic panel     Status: Abnormal   Collection Time: 03/03/20  1:29 PM  Result Value Ref Range   Sodium 133 (L) 135 - 145 mmol/L   Potassium 2.9 (L) 3.5 - 5.1 mmol/L   Chloride 100 98 - 111 mmol/L   CO2 17 (L) 22 - 32 mmol/L   Glucose, Bld 81 70 - 99 mg/dL   BUN 6 6 - 20 mg/dL   Creatinine, Ser 4.78 0.44 - 1.00 mg/dL   Calcium 9.3 8.9 - 29.5 mg/dL   GFR calc non Af Amer >60 >60 mL/min   GFR calc Af Amer >60 >60 mL/min   Anion gap 16 (H) 5 - 15    Imaging:  [place] MFM FETAL BPP WO NON STRESS  Result Date: 03/03/2020 ----------------------------------------------------------------------  OBSTETRICS REPORT                       (Signed Final 03/03/2020 03:28 pm) ---------------------------------------------------------------------- Patient Info  ID #:       [id number]                          D.O.B.:  07/24/1996 (24 yrs)  Name:        Melanie Hancock                 Visit Date: 03/03/2020 02:26 pm ----------------------------------------------------------------------  Performed By  Attending:        Lin Landsman      Ref. Address:      [facility]                    MD                                                              Obstetrics &                                                              Gynecology                                                              [address]                                                              [address]                                                              [place], [place]                                                              [address]  Performed By:     Birdena Crandall        Secondary Phy.:    [facility] MAU/Triage                    RDMS,RVT  Referred By:      Silverio Lay          [facility] ---------------------------------------------------------------------- Orders  #  Description                           Code        Ordered By  1  [place] MFM FETAL BPP WO NON               29562.13    Ashtynn Berke     STRESS ----------------------------------------------------------------------  #  Order #                     Accession #                Episode #  1  [id number]                   [id number]                 [id number] ---------------------------------------------------------------------- Indications  Non-reactive NST                                O28.9  [redacted] weeks gestation of pregnancy                 Z3A.34  Decreased fetal movement                        O36.8190  Fetal abnormality - other known or              O35.9XX0  suspected (LVEIF) ---------------------------------------------------------------------- Fetal Evaluation  Num Of Fetuses:          1  Fetal Heart              138  Rate(bpm):  Cardiac Activity:         Observed  Presentation:            Cephalic  Placenta:                Posterior  P. Cord Insertion:       Previously Visualized  Amniotic Fluid  AFI FV:      Within normal limits  AFI Sum(cm)     %Tile       Largest Pocket(cm)  8.7             10          3.4  RUQ(cm)       RLQ(cm)       LUQ(cm)        LLQ(cm)  1.9           1.6           3.4            1.8 ---------------------------------------------------------------------- Biophysical Evaluation  Amniotic F.V:   Within normal limits       F. Tone:         Observed  F. Movement:    Observed                   Score:           8/8  F. Breathing:   Observed ---------------------------------------------------------------------- OB History  Gravidity:    5         Term:   3        Prem:   0         SAB:   0  TOP:  1       Ectopic:  0        Living: 3 ---------------------------------------------------------------------- Gestational Age  LMP:           34w 3d        Date:  07/06/19                 EDD:    04/11/20  Best:          34w 3d     Det. By:  LMP  (07/06/19)          EDD:    04/11/20 ---------------------------------------------------------------------- Anatomy  Ventricles:            Appears normal         Kidneys:                Appear normal  Diaphragm:             Appears normal         Bladder:                Appears normal  Stomach:               Appears normal,                         left sided ---------------------------------------------------------------------- Impression  Antenatal testing due to decreased fetal movement with a  non reactive NST.  Biophysical profile 8/10 with good amniotic fluid and fetal  movement. ---------------------------------------------------------------------- Recommendations  Follow up per inpatient provider. ----------------------------------------------------------------------               Lin Landsman, MD Electronically Signed Final Report   03/03/2020 03:28 pm  ----------------------------------------------------------------------   MAU Course: Orders Placed This Encounter  Procedures   [place] MFM FETAL BPP WO NON STRESS   Urinalysis, Routine w reflex microscopic   Basic metabolic panel   Consult to Transition of Care Team   Discharge patient   Meds ordered this encounter  Medications   lactated ringers bolus 1,000 mL   promethazine (PHENERGAN) injection 25 mg   cyclobenzaprine (FLEXERIL) tablet 5 mg   famotidine (PEPCID) IVPB 20 mg premix   potassium chloride 10 mEq in 100 mL IVPB   lactated ringers infusion    MDM: BMP - K low given 3 doses of K with LR @125ml /hr, pt tolerated well  BPP 8/8 LR bolus, phenergan, pepcid - pt able to keep down liquids and expressed improvement Flexeril for low back and overall joint pain - pt expressed relief  RN reported that pt told her she'd called private MD and CNM would be over to discuss induction. Spoke to , CNM who confirmed she would not be taking over care or inducing patient today without clinical indication.  Discussed with pt and encouraged her to discuss induction with OB provider at next prenatal appt.   Assessment: 1. Hypokalemia   2. NST (non-stress test) nonreactive   3. Hyperemesis affecting pregnancy, antepartum   4. NST (non-stress test) reactive     Plan: Discharge home in stable condition.    Follow-up Information    [facility] Go to.   Specialty: Obstetrics and Gynecology Why: as scheduled for ongoing prenatal care Contact information: [address] [place] [phone]               Allergies as of 03/03/2020   No Known Allergies     Medication List  TAKE these medications   acetaminophen 650 MG CR tablet Commonly known as: Tylenol 8 Hour Take 1 tablet (650 mg total) by mouth every 8 (eight) hours as needed for pain.   famotidine 10 MG tablet Commonly known as: PEPCID Take 10 mg by  mouth 2 (two) times daily.   ferrous sulfate 325 (65 FE) MG tablet Commonly known as: FerrouSul Take 1 tablet (325 mg total) by mouth 2 (two) times daily.   hydrocortisone-pramoxine rectal foam Commonly known as: PROCTOFOAM-HC Place 1 applicator rectally 2 (two) times daily. What changed:   when to take this  reasons to take this   metoCLOPramide 10 MG tablet Commonly known as: REGLAN Take 1 tablet (10 mg total) by mouth every 6 (six) hours.   ondansetron 4 MG tablet Commonly known as: ZOFRAN Take 4 mg by mouth every 8 (eight) hours as needed for nausea or vomiting.   prenatal multivitamin Tabs tablet Take 1 tablet by mouth daily at 12 noon.   scopolamine 1 MG/3DAYS Commonly known as: TRANSDERM-SCOP Place 1 patch onto the skin every 3 (three) days.   valACYclovir 1000 MG tablet Commonly known as: VALTREX Take 1,000 mg by mouth as needed (break out).   zolpidem 5 MG tablet Commonly known as: AMBIEN Take 5 mg by mouth at bedtime as needed for sleep.       Bernerd LimboWalker, Wilmer Berryhill R, CNM 03/03/2020 8:32 PM,

## 2020-03-03 NOTE — MAU Note (Signed)
Pt reports she has been taking her nausea meds meds but still no relief. C/o body aches as well. Also c/o leaking fluid from her vagina. Stated " I hope they induce me today I can't take this pain anymore feels like my pelvis is breaking."

## 2020-03-03 NOTE — MAU Note (Signed)
Social work at bedside.  

## 2020-03-08 ENCOUNTER — Inpatient Hospital Stay (HOSPITAL_COMMUNITY)
Admission: AD | Admit: 2020-03-08 | Discharge: 2020-03-09 | Disposition: A | Discharge status: Home / Self Care | Payer: Medicaid Other | Attending: Obstetrics and Gynecology | Admitting: Obstetrics and Gynecology

## 2020-03-08 ENCOUNTER — Other Ambulatory Visit: Payer: Self-pay

## 2020-03-08 ENCOUNTER — Encounter (HOSPITAL_COMMUNITY): Payer: Self-pay | Admitting: Obstetrics and Gynecology

## 2020-03-08 DIAGNOSIS — Z79899 Other long term (current) drug therapy: Secondary | ICD-10-CM | POA: Insufficient documentation

## 2020-03-08 DIAGNOSIS — O26893 Other specified pregnancy related conditions, third trimester: Secondary | ICD-10-CM | POA: Diagnosis not present

## 2020-03-08 DIAGNOSIS — O368130 Decreased fetal movements, third trimester, not applicable or unspecified: Secondary | ICD-10-CM | POA: Diagnosis not present

## 2020-03-08 DIAGNOSIS — K219 Gastro-esophageal reflux disease without esophagitis: Secondary | ICD-10-CM | POA: Insufficient documentation

## 2020-03-08 DIAGNOSIS — Z3A36 36 weeks gestation of pregnancy: Secondary | ICD-10-CM | POA: Diagnosis not present

## 2020-03-08 DIAGNOSIS — Z87891 Personal history of nicotine dependence: Secondary | ICD-10-CM | POA: Insufficient documentation

## 2020-03-08 DIAGNOSIS — R102 Pelvic and perineal pain: Secondary | ICD-10-CM | POA: Insufficient documentation

## 2020-03-08 DIAGNOSIS — O99613 Diseases of the digestive system complicating pregnancy, third trimester: Secondary | ICD-10-CM | POA: Insufficient documentation

## 2020-03-08 DIAGNOSIS — Z3A35 35 weeks gestation of pregnancy: Secondary | ICD-10-CM | POA: Diagnosis not present

## 2020-03-08 DIAGNOSIS — R519 Headache, unspecified: Secondary | ICD-10-CM | POA: Diagnosis not present

## 2020-03-08 LAB — URINALYSIS, ROUTINE W REFLEX MICROSCOPIC
Bilirubin Urine: NEGATIVE
Glucose, UA: NEGATIVE mg/dL
Ketones, ur: NEGATIVE mg/dL
Leukocytes,Ua: NEGATIVE
Nitrite: NEGATIVE
Protein, ur: NEGATIVE mg/dL
Specific Gravity, Urine: 1.005 (ref 1.005–1.030)
pH: 9 — ABNORMAL HIGH (ref 5.0–8.0)

## 2020-03-08 MED ORDER — ALUM & MAG HYDROXIDE-SIMETH 200-200-20 MG/5ML PO SUSP
30.0000 mL | Freq: Once | ORAL | Status: AC
  Administered 2020-03-08: 30 mL via ORAL
  Filled 2020-03-08: qty 30

## 2020-03-08 MED ORDER — CYCLOBENZAPRINE HCL 5 MG PO TABS
5.0000 mg | ORAL_TABLET | Freq: Once | ORAL | Status: AC
  Administered 2020-03-08: 5 mg via ORAL
  Filled 2020-03-08: qty 1

## 2020-03-08 MED ORDER — BUTALBITAL-APAP-CAFFEINE 50-325-40 MG PO TABS
1.0000 | ORAL_TABLET | Freq: Once | ORAL | Status: AC
  Administered 2020-03-08: 1 via ORAL
  Filled 2020-03-08: qty 1

## 2020-03-08 MED ORDER — NIFEDIPINE 10 MG PO CAPS
10.0000 mg | ORAL_CAPSULE | ORAL | Status: DC | PRN
  Administered 2020-03-08 (×3): 10 mg via ORAL
  Filled 2020-03-08 (×3): qty 1

## 2020-03-08 NOTE — MAU Note (Signed)
Pt presents with complaint of pressure in pelvic area, feels like something is coming out. Reports decreased fetal movement since contractions started at 6:00. Denies ROM

## 2020-03-08 NOTE — MAU Provider Note (Signed)
Chief Complaint:  Labor Eval   First Provider Initiated Contact with Patient 03/08/20 2009     HPI: Melanie Hancock is a 24 y.o. [id number] at 73w1dwho presents to maternity admissions reporting painful contractions since 6pm.  Felt a lot of lower abdominal pressure.  Checked by CNM and cervix felt to be 1.5cm.  States is being followed for low amniotic fluid with weekly US's She reports decreased fetal movement when having contractions, denies LOF, vaginal bleeding, vaginal itching/burning, urinary symptoms, h/a, dizziness, n/v, diarrhea, constipation or fever/chills.  She denies headache, visual changes or RUQ abdominal pain.  Abdominal Pain This is a new problem. The current episode started today. The problem occurs intermittently. The problem has been unchanged. The pain is located in the LLQ, RLQ and suprapubic region. The quality of the pain is cramping. The abdominal pain does not radiate. Pertinent negatives include no constipation, diarrhea, dysuria, fever, frequency, headaches, myalgias, nausea or vomiting. Nothing aggravates the pain. The pain is relieved by nothing. She has tried nothing for the symptoms.    RN Note: Pt presents with complaint of pressure in pelvic area, feels like something is coming out. Reports decreased fetal movement since contractions started at 6:00. Denies ROM   Past Medical History: Past Medical History:  Diagnosis Date   Anemia    Anxiety    Blood transfusion without reported diagnosis    Chronic back pain    Depression    GERD (gastroesophageal reflux disease)    Infection    UTI   Post partum depression    Pyelonephritis    UTI (lower urinary tract infection)     Past obstetric history: OB History  Gravida Para Term Preterm AB Living  5 3 3   1 3   SAB TAB Ectopic Multiple Live Births  0 1   0 3    # Outcome Date GA Lbr Len/2nd Weight Sex Delivery Anes PTL Lv  5 Current           4 Term 02/04/16 [redacted]w[redacted]d 56:55 / 00:15 3745 g M  Vag-Spont EPI  LIV  3 Term 11/20/14 [redacted]w[redacted]d 01:14 / 00:08 3260 g M Vag-Spont None  LIV  2 Term 02/11/10    M Vag-Spont   LIV  1 TAB              Birth Comments: System Generated. Please review and update pregnancy details.    Past Surgical History: Past Surgical History:  Procedure Laterality Date   DILATION AND CURETTAGE OF UTERUS     INDUCED ABORTION     WISDOM TOOTH EXTRACTION      Family History: Family History  Problem Relation Age of Onset   Arthritis Mother    Healthy Father    Arthritis Maternal Grandmother    Asthma Son    Alcohol abuse Neg Hx     Social History: Social History   Tobacco Use   Smoking status: Former Smoker    Quit date: 01/21/2014    Years since quitting: 6.1   Smokeless tobacco: Never Used  Vaping Use   Vaping Use: Former   Substances: Nicotine, Flavoring  Substance Use Topics   Alcohol use: Not Currently    Comment: rarely   Drug use: Not Currently    Types: Marijuana    Comment: 2-3 weeks ago    Allergies: No Known Allergies  Meds:  Medications Prior to Admission  Medication Sig Dispense Refill Last Dose   Prenatal Vit-Fe Fumarate-FA (PRENATAL MULTIVITAMIN) TABS tablet  Take 1 tablet by mouth daily at 12 noon.   03/08/2020 at Unknown time   scopolamine (TRANSDERM-SCOP) 1 MG/3DAYS Place 1 patch onto the skin every 3 (three) days.   Past Week at Unknown time   valACYclovir (VALTREX) 1000 MG tablet Take 1,000 mg by mouth as needed (break out).    03/07/2020 at Unknown time   acetaminophen (TYLENOL 8 HOUR) 650 MG CR tablet Take 1 tablet (650 mg total) by mouth every 8 (eight) hours as needed for pain. 30 tablet 0 Unknown at Unknown time   famotidine (PEPCID) 10 MG tablet Take 10 mg by mouth 2 (two) times daily.   Unknown at Unknown time   ferrous sulfate (FERROUSUL) 325 (65 FE) MG tablet Take 1 tablet (325 mg total) by mouth 2 (two) times daily. (Patient not taking: Reported on 01/05/2020) 60 tablet 1     hydrocortisone-pramoxine (PROCTOFOAM-HC) rectal foam Place 1 applicator rectally 2 (two) times daily. (Patient taking differently: Place 1 applicator rectally as needed for hemorrhoids. ) 10 g 1    metoCLOPramide (REGLAN) 10 MG tablet Take 1 tablet (10 mg total) by mouth every 6 (six) hours. 20 tablet 0    ondansetron (ZOFRAN) 4 MG tablet Take 4 mg by mouth every 8 (eight) hours as needed for nausea or vomiting.   Unknown at Unknown time   zolpidem (AMBIEN) 5 MG tablet Take 5 mg by mouth at bedtime as needed for sleep.    Unknown at Unknown time    I have reviewed patient's Past Medical Hx, Surgical Hx, Family Hx, Social Hx, medications and allergies.   ROS:  Review of Systems  Constitutional: Negative for fever.  Gastrointestinal: Positive for abdominal pain. Negative for constipation, diarrhea, nausea and vomiting.  Genitourinary: Negative for dysuria and frequency.  Musculoskeletal: Negative for myalgias.  Neurological: Negative for headaches.   Other systems negative  Physical Exam   Patient Vitals for the past 24 hrs:  BP Temp Temp src Pulse Resp SpO2 Height Weight  03/08/20 1952 (!) 118/58 98.7 F (37.1 C) Oral (!) 102 18 100 % 5' 5.5" (1.664 m) 72.6 kg   Vitals:   03/08/20 2150 03/08/20 2201 03/08/20 2344 03/09/20 0159  BP:  (!) 126/55 117/63 (!) 101/54  Pulse:  96 (!) 102 86  Resp:    15  Temp:    98.4 F (36.9 C)  TempSrc:    Oral  SpO2: 99%   100%  Weight:      Height:        Constitutional: Well-developed, well-nourished female in no acute distress.  Cardiovascular: normal rate and rhythm Respiratory: normal effort, clear to auscultation bilaterally GI: Abd soft, non-tender, gravid appropriate for gestational age.   No rebound or guarding. MS: Extremities nontender, no edema, normal ROM Neurologic: Alert and oriented x 4.  GU: Neg CVAT.  PELVIC EXAM:  Dilation: 1.5 Effacement (%): 0 Cervical Position: Posterior Exam by:: jamilla,cnm  FHT:  Baseline  140 , moderate variability, accelerations present, no decelerations Contractions: q 3-5 mins Irregular     Labs: Results for orders placed or performed during the hospital encounter of 03/08/20 (from the past 24 hour(s))  Urinalysis, Routine w reflex microscopic     Status: Abnormal   Collection Time: 03/08/20  8:23 PM  Result Value Ref Range   Color, Urine YELLOW YELLOW   APPearance CLEAR CLEAR   Specific Gravity, Urine 1.005 1.005 - 1.030   pH 9.0 (H) 5.0 - 8.0   Glucose, UA NEGATIVE  NEGATIVE mg/dL   Hgb urine dipstick SMALL (A) NEGATIVE   Bilirubin Urine NEGATIVE NEGATIVE   Ketones, ur NEGATIVE NEGATIVE mg/dL   Protein, ur NEGATIVE NEGATIVE mg/dL   Nitrite NEGATIVE NEGATIVE   Leukocytes,Ua NEGATIVE NEGATIVE   RBC / HPF 0-5 0 - 5 RBC/hpf   WBC, UA 0-5 0 - 5 WBC/hpf   Bacteria, UA FEW (A) NONE SEEN   Squamous Epithelial / LPF 0-5 0 - 5   Sperm, UA PRESENT        Imaging:    MAU Course/MDM: I have ordered labs and reviewed results.  NST reviewed and is reassuring, though at times not reactive.  Accels and variability do meet criteria.  Treatments in MAU included Procardia series for PTL, Maalox for acid reflux, Fioricet and Flexeril for migraine.    Contractions finally diminished after third dose of Nifedipine.  Developed a headache from the Nifedipine.  Treated as above  After that continued to complain of pain in pelvic bones, states can "feel them pop".  Flexeril did help this somewhat.   Assessment: Single IUP at [redacted]w[redacted]d Preterm uterine contractions Headache related to procardia Pelvic bone/joint pain  Plan: Discharge home Labor precautions and fetal kick counts Suggested she request referral to physical therapy for pelvic pain.  Follow up in Office for prenatal visits and recheck of cervix  Encouraged to return here or to other Urgent Care/ED if she develops worsening of symptoms, increase in pain, fever, or other concerning symptoms.   Pt stable at time of  discharge.  Wynelle Bourgeois CNM, MSN Certified Nurse-Midwife 03/08/2020 8:09 PM

## 2020-03-09 DIAGNOSIS — R519 Headache, unspecified: Secondary | ICD-10-CM

## 2020-03-09 DIAGNOSIS — R102 Pelvic and perineal pain: Secondary | ICD-10-CM

## 2020-03-09 DIAGNOSIS — Z3A36 36 weeks gestation of pregnancy: Secondary | ICD-10-CM

## 2020-03-09 DIAGNOSIS — O26893 Other specified pregnancy related conditions, third trimester: Secondary | ICD-10-CM

## 2020-03-09 MED ORDER — BUTALBITAL-APAP-CAFFEINE 50-325-40 MG PO TABS
1.0000 | ORAL_TABLET | Freq: Once | ORAL | Status: AC
  Administered 2020-03-09: 1 via ORAL
  Filled 2020-03-09: qty 1

## 2020-03-09 NOTE — Discharge Instructions (Signed)
Preterm Labor and Birth Information ° °The normal length of a pregnancy is 39-41 weeks. Preterm labor is when labor starts before 37 completed weeks of pregnancy. °What are the risk factors for preterm labor? °Preterm labor is more likely to occur in women who: °· Have certain infections during pregnancy such as a bladder infection, sexually transmitted infection, or infection inside the uterus (chorioamnionitis). °· Have a shorter-than-normal cervix. °· Have gone into preterm labor before. °· Have had surgery on their cervix. °· Are younger than age 17 or older than age 35. °· Are African American. °· Are pregnant with twins or multiple babies (multiple gestation). °· Take street drugs or smoke while pregnant. °· Do not gain enough weight while pregnant. °· Became pregnant shortly after having been pregnant. °What are the symptoms of preterm labor? °Symptoms of preterm labor include: °· Cramps similar to those that can happen during a menstrual period. The cramps may happen with diarrhea. °· Pain in the abdomen or lower back. °· Regular uterine contractions that may feel like tightening of the abdomen. °· A feeling of increased pressure in the pelvis. °· Increased watery or bloody mucus discharge from the vagina. °· Water breaking (ruptured amniotic sac). °Why is it important to recognize signs of preterm labor? °It is important to recognize signs of preterm labor because babies who are born prematurely may not be fully developed. This can put them at an increased risk for: °· Long-term (chronic) heart and lung problems. °· Difficulty immediately after birth with regulating body systems, including blood sugar, body temperature, heart rate, and breathing rate. °· Bleeding in the brain. °· Cerebral palsy. °· Learning difficulties. °· Death. °These risks are highest for babies who are born before 34 weeks of pregnancy. °How is preterm labor treated? °Treatment depends on the length of your pregnancy, your condition,  and the health of your baby. It may involve: °· Having a stitch (suture) placed in your cervix to prevent your cervix from opening too early (cerclage). °· Taking or being given medicines, such as: °? Hormone medicines. These may be given early in pregnancy to help support the pregnancy. °? Medicine to stop contractions. °? Medicines to help mature the baby’s lungs. These may be prescribed if the risk of delivery is high. °? Medicines to prevent your baby from developing cerebral palsy. °If the labor happens before 34 weeks of pregnancy, you may need to stay in the hospital. °What should I do if I think I am in preterm labor? °If you think that you are going into preterm labor, call your health care provider right away. °How can I prevent preterm labor in future pregnancies? °To increase your chance of having a full-term pregnancy: °· Do not use any tobacco products, such as cigarettes, chewing tobacco, and e-cigarettes. If you need help quitting, ask your health care provider. °· Do not use street drugs or medicines that have not been prescribed to you during your pregnancy. °· Talk with your health care provider before taking any herbal supplements, even if you have been taking them regularly. °· Make sure you gain a healthy amount of weight during your pregnancy. °· Watch for infection. If you think that you might have an infection, get it checked right away. °· Make sure to tell your health care provider if you have gone into preterm labor before. °This information is not intended to replace advice given to you by your health care provider. Make sure you discuss any questions you have with your   health care provider. °Document Revised: 11/28/2018 Document Reviewed: 12/28/2015 °Elsevier Patient Education © 2020 [employer] ° °

## 2020-03-10 LAB — OB RESULTS CONSOLE GBS: GBS: NEGATIVE

## 2020-03-14 ENCOUNTER — Encounter (HOSPITAL_COMMUNITY): Payer: Self-pay | Admitting: Obstetrics and Gynecology

## 2020-03-14 ENCOUNTER — Other Ambulatory Visit: Payer: Self-pay

## 2020-03-14 ENCOUNTER — Observation Stay (HOSPITAL_COMMUNITY): Payer: Medicaid Other

## 2020-03-14 ENCOUNTER — Observation Stay (HOSPITAL_COMMUNITY)
Admission: AD | Admit: 2020-03-14 | Discharge: 2020-03-16 | Disposition: A | Discharge status: Home / Self Care | Payer: Medicaid Other | Attending: Obstetrics and Gynecology | Admitting: Obstetrics and Gynecology

## 2020-03-14 DIAGNOSIS — H538 Other visual disturbances: Secondary | ICD-10-CM | POA: Insufficient documentation

## 2020-03-14 DIAGNOSIS — Z3A36 36 weeks gestation of pregnancy: Secondary | ICD-10-CM | POA: Insufficient documentation

## 2020-03-14 DIAGNOSIS — M25562 Pain in left knee: Secondary | ICD-10-CM | POA: Insufficient documentation

## 2020-03-14 DIAGNOSIS — Z8261 Family history of arthritis: Secondary | ICD-10-CM | POA: Diagnosis not present

## 2020-03-14 DIAGNOSIS — Z825 Family history of asthma and other chronic lower respiratory diseases: Secondary | ICD-10-CM | POA: Insufficient documentation

## 2020-03-14 DIAGNOSIS — Z87891 Personal history of nicotine dependence: Secondary | ICD-10-CM | POA: Insufficient documentation

## 2020-03-14 DIAGNOSIS — M25561 Pain in right knee: Secondary | ICD-10-CM | POA: Insufficient documentation

## 2020-03-14 DIAGNOSIS — K219 Gastro-esophageal reflux disease without esophagitis: Secondary | ICD-10-CM | POA: Diagnosis not present

## 2020-03-14 DIAGNOSIS — Z87440 Personal history of urinary (tract) infections: Secondary | ICD-10-CM | POA: Diagnosis not present

## 2020-03-14 DIAGNOSIS — Z20822 Contact with and (suspected) exposure to covid-19: Secondary | ICD-10-CM | POA: Diagnosis not present

## 2020-03-14 DIAGNOSIS — Z79899 Other long term (current) drug therapy: Secondary | ICD-10-CM | POA: Diagnosis not present

## 2020-03-14 DIAGNOSIS — O26893 Other specified pregnancy related conditions, third trimester: Secondary | ICD-10-CM | POA: Diagnosis not present

## 2020-03-14 DIAGNOSIS — G44201 Tension-type headache, unspecified, intractable: Secondary | ICD-10-CM

## 2020-03-14 DIAGNOSIS — R519 Headache, unspecified: Secondary | ICD-10-CM | POA: Diagnosis present

## 2020-03-14 LAB — COMPREHENSIVE METABOLIC PANEL
ALT: 25 U/L (ref 0–44)
AST: 21 U/L (ref 15–41)
Albumin: 2.4 g/dL — ABNORMAL LOW (ref 3.5–5.0)
Alkaline Phosphatase: 121 U/L (ref 38–126)
Anion gap: 7 (ref 5–15)
BUN: <5 mg/dL — ABNORMAL LOW (ref 6–20)
CO2: 23 mmol/L (ref 22–32)
Calcium: 8.5 mg/dL — ABNORMAL LOW (ref 8.9–10.3)
Chloride: 105 mmol/L (ref 98–111)
Creatinine, Ser: 0.53 mg/dL (ref 0.44–1.00)
GFR calc Af Amer: >60 mL/min (ref >60)
GFR calc non Af Amer: >60 mL/min (ref >60)
Glucose, Bld: 96 mg/dL (ref 70–99)
Potassium: 3.7 mmol/L (ref 3.5–5.1)
Sodium: 135 mmol/L (ref 135–145)
Total Bilirubin: 0.6 mg/dL (ref 0.3–1.2)
Total Protein: 5.6 g/dL — ABNORMAL LOW (ref 6.5–8.1)

## 2020-03-14 LAB — CBC WITH DIFFERENTIAL/PLATELET
Abs Immature Granulocytes: 0.03 10*3/uL (ref 0.00–0.07)
Basophils Absolute: 0 10*3/uL (ref 0.0–0.1)
Basophils Relative: 1 %
Eosinophils Absolute: 0.2 10*3/uL (ref 0.0–0.5)
Eosinophils Relative: 3 %
HCT: 27.2 % — ABNORMAL LOW (ref 36.0–46.0)
Hemoglobin: 8.7 g/dL — ABNORMAL LOW (ref 12.0–15.0)
Immature Granulocytes: 1 %
Lymphocytes Relative: 22 %
Lymphs Abs: 1.4 10*3/uL (ref 0.7–4.0)
MCH: 26.6 pg (ref 26.0–34.0)
MCHC: 32 g/dL (ref 30.0–36.0)
MCV: 83.2 fL (ref 80.0–100.0)
Monocytes Absolute: 0.7 10*3/uL (ref 0.1–1.0)
Monocytes Relative: 10 %
Neutro Abs: 4.1 10*3/uL (ref 1.7–7.7)
Neutrophils Relative %: 63 %
Platelets: 331 10*3/uL (ref 150–400)
RBC: 3.27 MIL/uL — ABNORMAL LOW (ref 3.87–5.11)
RDW: 15 % (ref 11.5–15.5)
WBC: 6.4 10*3/uL (ref 4.0–10.5)
nRBC: 0 % (ref 0.0–0.2)

## 2020-03-14 LAB — URINALYSIS, ROUTINE W REFLEX MICROSCOPIC
Bilirubin Urine: NEGATIVE
Glucose, UA: NEGATIVE mg/dL
Hgb urine dipstick: NEGATIVE
Ketones, ur: NEGATIVE mg/dL
Leukocytes,Ua: NEGATIVE
Nitrite: NEGATIVE
Protein, ur: NEGATIVE mg/dL
Specific Gravity, Urine: 1.01 (ref 1.005–1.030)
pH: 8 (ref 5.0–8.0)

## 2020-03-14 LAB — URIC ACID: Uric Acid, Serum: 2.3 mg/dL — ABNORMAL LOW (ref 2.5–7.1)

## 2020-03-14 LAB — PROTEIN / CREATININE RATIO, URINE
Creatinine, Urine: 59.69 mg/dL
Total Protein, Urine: <6 mg/dL

## 2020-03-14 LAB — TYPE AND SCREEN
ABO/RH(D): A POS
Antibody Screen: NEGATIVE

## 2020-03-14 LAB — SARS CORONAVIRUS 2 BY RT PCR (HOSPITAL ORDER, PERFORMED IN ~~LOC~~ HOSPITAL LAB): SARS Coronavirus 2: NEGATIVE

## 2020-03-14 MED ORDER — CALCIUM CARBONATE ANTACID 500 MG PO CHEW
2.0000 | CHEWABLE_TABLET | ORAL | Status: DC | PRN
  Administered 2020-03-16: 400 mg via ORAL
  Filled 2020-03-14 (×2): qty 2

## 2020-03-14 MED ORDER — DIPHENHYDRAMINE HCL 50 MG/ML IJ SOLN
25.0000 mg | Freq: Once | INTRAMUSCULAR | Status: DC
  Filled 2020-03-14: qty 1

## 2020-03-14 MED ORDER — LACTATED RINGERS IV BOLUS: 500.0000 mL | Freq: Once | INTRAVENOUS | Status: DC

## 2020-03-14 MED ORDER — METOCLOPRAMIDE HCL 5 MG/ML IJ SOLN
10.0000 mg | Freq: Once | INTRAMUSCULAR | Status: DC
  Filled 2020-03-14: qty 2

## 2020-03-14 MED ORDER — ACETAMINOPHEN 500 MG PO TABS
1000.0000 mg | ORAL_TABLET | Freq: Once | ORAL | Status: AC
  Administered 2020-03-14: 1000 mg via ORAL
  Filled 2020-03-14: qty 2

## 2020-03-14 MED ORDER — PRENATAL MULTIVITAMIN CH
1.0000 | ORAL_TABLET | Freq: Every day | ORAL | Status: DC
  Administered 2020-03-15 – 2020-03-16 (×2): 1 via ORAL
  Filled 2020-03-14 (×2): qty 1

## 2020-03-14 MED ORDER — ACETAMINOPHEN 325 MG PO TABS
650.0000 mg | ORAL_TABLET | ORAL | Status: DC | PRN
  Administered 2020-03-15: 650 mg via ORAL
  Filled 2020-03-14: qty 2

## 2020-03-14 MED ORDER — CYCLOBENZAPRINE HCL 5 MG PO TABS
5.0000 mg | ORAL_TABLET | Freq: Once | ORAL | Status: AC
  Administered 2020-03-14: 5 mg via ORAL
  Filled 2020-03-14: qty 1

## 2020-03-14 MED ORDER — DEXAMETHASONE SODIUM PHOSPHATE 10 MG/ML IJ SOLN
10.0000 mg | Freq: Once | INTRAMUSCULAR | Status: DC
  Filled 2020-03-14: qty 1

## 2020-03-14 MED ORDER — ZOLPIDEM TARTRATE 5 MG PO TABS
5.0000 mg | ORAL_TABLET | Freq: Every evening | ORAL | Status: DC | PRN
  Administered 2020-03-16: 5 mg via ORAL
  Filled 2020-03-14 (×2): qty 1

## 2020-03-14 MED ORDER — DOCUSATE SODIUM 100 MG PO CAPS
100.0000 mg | ORAL_CAPSULE | Freq: Every day | ORAL | Status: DC
  Filled 2020-03-14: qty 1

## 2020-03-14 NOTE — MAU Provider Note (Signed)
History     Patient Active Problem List   Diagnosis Date Noted  . Vaginal delivery 02/04/2016  . History of postpartum depression, currently pregnant 01/23/2016  . Iron deficiency anemia 01/02/2016  . 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 Trichomoniasis 06/17/2015  . Anxiety 03/28/2014  . Chronic back pain 03/28/2014  . Hx pyelonephritis 2014 05/26/2013    Chief Complaint  Patient presents with  . Hypertension   Melanie Hancock is a 24 y.o. [id number] at [redacted]w[redacted]d presenting with c/o severe headache (10/10) that has gone on intermittently for 7 days (since Procardia dosing in MAU for preterm contractions). It is a frontal headache that radiates to her temples, and is accompanied by spots and blurred vision. She has also had bilateral swelling in her feet/ankles that went away after massage and elevation. She is concerned about preeclampsia. She reports pelvic pressure (unchanged since last several visits) but no further contractions, VB, or LOF.  Headache  This is a new problem. The current episode started in the past 7 days. The problem occurs intermittently. The problem has been unchanged. The pain is located in the frontal and temporal region. The pain does not radiate. The pain quality is not similar to prior headaches. The quality of the pain is described as throbbing. The pain is at a severity of 10/10. The pain is severe. Associated symptoms include blurred vision, nausea (nauseated when she tried to take percocet for pelvic pain) and a visual change. Pertinent negatives include no abdominal pain or dizziness. The symptoms are aggravated by bright light. She has tried nothing for the symptoms. The treatment provided no relief.    OB History    Gravida  5   Para  3   Term  3   Preterm      AB  1   Living  3     SAB  0   TAB  1   Ectopic      Multiple  0   Live Births  3           Past Medical History:  Diagnosis Date  . Anemia   . Anxiety    . Blood transfusion without reported diagnosis   . Chronic back pain   . Depression   . GERD (gastroesophageal reflux disease)   . Infection    UTI  . Post partum depression   . Pyelonephritis   . UTI (lower urinary tract infection)     Past Surgical History:  Procedure Laterality Date  . DILATION AND CURETTAGE OF UTERUS    . INDUCED ABORTION    . WISDOM TOOTH EXTRACTION      Family History  Problem Relation Age of Onset  . Arthritis Mother   . Healthy Father   . Arthritis Maternal Grandmother   . Asthma Son   . Alcohol abuse Neg Hx     Social History   Tobacco Use  . Smoking status: Former Smoker    Quit date: 01/21/2014    Years since quitting: 6.1  . Smokeless tobacco: Never Used  Vaping Use  . Vaping Use: Former  . Substances: Nicotine, Flavoring  Substance Use Topics  . Alcohol use: Not Currently    Comment: rarely  . Drug use: Not Currently    Types: Marijuana    Comment: 2-3 weeks ago    Allergies: No Known Allergies  Medications Prior to Admission  Medication Sig Dispense Refill Last Dose  . acetaminophen (TYLENOL 8  HOUR) 650 MG CR tablet Take 1 tablet (650 mg total) by mouth every 8 (eight) hours as needed for pain. 30 tablet 0 03/14/2020 at Unknown time  . oxyCODONE-acetaminophen (PERCOCET/ROXICET) 5-325 MG tablet Take 1 tablet by mouth every 4 (four) hours as needed for severe pain.   03/14/2020 at Unknown time  . Prenatal Vit-Fe Fumarate-FA (PRENATAL MULTIVITAMIN) TABS tablet Take 1 tablet by mouth daily at 12 noon.   03/14/2020 at Unknown time  . valACYclovir (VALTREX) 1000 MG tablet Take 1,000 mg by mouth as needed (break out).    03/14/2020 at Unknown time  . famotidine (PEPCID) 10 MG tablet Take 10 mg by mouth 2 (two) times daily.   Unknown at Unknown time  . ferrous sulfate (FERROUSUL) 325 (65 FE) MG tablet Take 1 tablet (325 mg total) by mouth 2 (two) times daily. (Patient not taking: Reported on 01/05/2020) 60 tablet 1 Unknown at Unknown time  .  hydrocortisone-pramoxine (PROCTOFOAM-HC) rectal foam Place 1 applicator rectally 2 (two) times daily. (Patient taking differently: Place 1 applicator rectally as needed for hemorrhoids. ) 10 g 1 Unknown at Unknown time  . metoCLOPramide (REGLAN) 10 MG tablet Take 1 tablet (10 mg total) by mouth every 6 (six) hours. 20 tablet 0 Unknown at Unknown time  . ondansetron (ZOFRAN) 4 MG tablet Take 4 mg by mouth every 8 (eight) hours as needed for nausea or vomiting.   Unknown at Unknown time  . scopolamine (TRANSDERM-SCOP) 1 MG/3DAYS Place 1 patch onto the skin every 3 (three) days.   Unknown at Unknown time  . zolpidem (AMBIEN) 5 MG tablet Take 5 mg by mouth at bedtime as needed for sleep.    Unknown at Unknown time    Review of Systems  Eyes: Positive for blurred vision.  Cardiovascular: Positive for leg swelling (ankles and feet, but none now).  Gastrointestinal: Positive for nausea (nauseated when she tried to take percocet for pelvic pain). Negative for abdominal pain and heartburn.  Neurological: Positive for headaches. Negative for dizziness.  All other systems reviewed and are negative.   See HPI Above Physical Exam   Blood pressure (!) 123/60, pulse (!) 112, temperature 98.8 F (37.1 C), temperature source Oral, resp. rate 16, height 5' 5.5" (1.664 m), weight 167 lb 12.8 oz (76.1 kg), last menstrual period 07/06/2019, SpO2 100 %.  Results for orders placed or performed during the hospital encounter of 03/14/20 (from the past 24 hour(s))  Urinalysis, Routine w reflex microscopic     Status: None   Collection Time: 03/14/20  5:21 PM  Result Value Ref Range   Color, Urine YELLOW YELLOW   APPearance CLEAR CLEAR   Specific Gravity, Urine 1.010 1.005 - 1.030   pH 8.0 5.0 - 8.0   Glucose, UA NEGATIVE NEGATIVE mg/dL   Hgb urine dipstick NEGATIVE NEGATIVE   Bilirubin Urine NEGATIVE NEGATIVE   Ketones, ur NEGATIVE NEGATIVE mg/dL   Protein, ur NEGATIVE NEGATIVE mg/dL   Nitrite NEGATIVE  NEGATIVE   Leukocytes,Ua NEGATIVE NEGATIVE    Physical Exam Vitals and nursing note reviewed. Exam conducted with a chaperone present.  Constitutional:      Appearance: Normal appearance. She is normal weight.  HENT:     Mouth/Throat:     Mouth: Mucous membranes are moist.  Cardiovascular:     Rate and Rhythm: Normal rate and regular rhythm.     Pulses: Normal pulses.     Heart sounds: Normal heart sounds.  Pulmonary:     Effort: Pulmonary  effort is normal.     Breath sounds: Normal breath sounds.  Abdominal:     General: Bowel sounds are normal.  Genitourinary:    Comments: No labor s/s, cervical/vaginal exam deferred  Neurological:     Mental Status: She is alert.    Fetal Tracing: reactive Baseline: 130 Variability: moderate Accelerations: present Decelerations: none Toco: UI   ED Course  Assessment: Headache with visual disturbances Normotensive  Plan: - Tylenol and flexeril for headache - no relief, pt stated pain remained 10/10 - Ordered/offered headache cocktail - when RN went in to give it, pt stated "I shouldn't have a headache for a week, I want to speak to my real doctor, Dr. Normand Sloop." - Consulted Dr. Su Hilt at [place], [facility] Rhea Pink, CNM) to assume care  Edd Arbour, CNM, MSN, [facility] 03/14/20 6:01 PM

## 2020-03-14 NOTE — H&P (Signed)
OB ADMISSION/ HISTORY & PHYSICAL:  Admission Date: 03/14/2020  5:08 PM  Admit Diagnosis: Acute intractable HA  Melanie Hancock is a 24 y.o. female [id number] [redacted]w[redacted]d presenting for new onset severe frontal HA and blurry vision x7 days not relieved with OTC and Rx meds. Pt was last seen in MAU on 7/20 for threatened PTL and was treated w/ Procardia 10 mg x4 doses. Pt states the HA began after Procardia series. Pt was seen in office on 7/22 for routine OB visit and c/o pelvic pain unrelieved by Tylenol and Flexeril. Request rx for Percocet, now states that Percocet did not relieve the pelvic pain so she dc'd it.  Reports significant edema to BLE over the weekend relieved w/ massage. Pain is present in both knees while at rest.   While being examined by MAU provider, pt declined HA cocktail and request to see [facility] provider. This CNM to bedside and pt offers recount of symptoms for past 7 days and states she is concerned for pre-eclampsia. Spoke w/ Dr. Su Hilt and plan of care developed to observe overnight, pre-e labs, CT scan, and MFM consult in AM for HA management. Pain management for HA offered and pt declines, stating "I don't like taking medicine and I'd rather know the cause instead of taking more medicine". Pt request to be induced due to the severe discomforts associated w/ this preg. Explained that a medical indication is required for IOL at this gestation. Pt states that she is satisfied w/ plan to observe overnight. Endorses active FM, denies LOF, ctx, and vaginal bleeding  History of current pregnancy: [id number]   Patient entered care with [facility] at 10+2 wks.   EDC of 04/11/20 was established by LMP and congruent w/ 10+4 wk U/S.   Anatomy scan:  with normal findings and Fetal heart echogenicity, pt declined amnio  Antenatal testing: for decreased AFI started at 34 weeks Last evaluation: 34 wks VTX, EFW 5+3, 47%ILE, AFI 7.5, BPP 8/8  Significant prenatal events: 20 visits to MAU this preg, SW consult  completed on 18th visit  Patient Active Problem List   Diagnosis Date Noted  . Acute intractable headache 03/14/2020  . Vaginal delivery 02/04/2016  . History of postpartum depression, currently pregnant 01/23/2016  . Iron deficiency anemia 01/02/2016  . 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 Trichomoniasis 06/17/2015  . Anxiety 03/28/2014  . Chronic back pain 03/28/2014  . Hx pyelonephritis 2014 05/26/2013    Prenatal Labs: ABO, Rh: --/--/A POS (07/26 2113) Antibody: NEG (07/26 2113) Rubella:   immune RPR:   NR HBsAg:   NR HIV: NON REACTIVE (12/17 2204)  GTT: passed GBS:   negative GC/CHL: neg/neg Genetics: low-risk female Tdap/influenza vaccines: current this preg   OB History  Gravida Para Term Preterm AB Living  5 3 3   1 3   SAB TAB Ectopic Multiple Live Births  0 1   0 3    # Outcome Date GA Lbr Len/2nd Weight Sex Delivery Anes PTL Lv  5 Current           4 Term 02/04/16 [redacted]w[redacted]d 56:55 / 00:15 3745 g M Vag-Spont EPI  LIV  3 Term 11/20/14 [redacted]w[redacted]d 01:14 / 00:08 3260 g M Vag-Spont None  LIV  2 Term 02/11/10    M Vag-Spont   LIV  1 TAB              Birth Comments: System Generated. Please review and update pregnancy details.  Medical / Surgical History: Past medical history:  Past Medical History:  Diagnosis Date  . Anemia   . Anxiety   . Blood transfusion without reported diagnosis   . Chronic back pain   . Depression   . GERD (gastroesophageal reflux disease)   . Infection    UTI  . Post partum depression   . Pyelonephritis   . UTI (lower urinary tract infection)     Past surgical history:  Past Surgical History:  Procedure Laterality Date  . DILATION AND CURETTAGE OF UTERUS    . INDUCED ABORTION    . WISDOM TOOTH EXTRACTION     Family History:  Family History  Problem Relation Age of Onset  . Arthritis Mother   . Healthy Father   . Arthritis Maternal Grandmother   . Asthma Son   . Alcohol abuse Neg Hx     Social History:   reports that she quit smoking about 6 years ago. She has never used smokeless tobacco. She reports previous alcohol use. She reports previous drug use. Drug: Marijuana.  Allergies: Patient has no known allergies.   Current Medications at time of admission:  Prior to Admission medications   Medication Sig Start Date End Date Taking? Authorizing Provider  acetaminophen (TYLENOL 8 HOUR) 650 MG CR tablet Take 1 tablet (650 mg total) by mouth every 8 (eight) hours as needed for pain. 01/05/20  Yes Aberman, Merla Riches, PA-C  oxyCODONE-acetaminophen (PERCOCET/ROXICET) 5-325 MG tablet Take 1 tablet by mouth every 4 (four) hours as needed for severe pain.   Yes [provider]  Prenatal Vit-Fe Fumarate-FA (PRENATAL MULTIVITAMIN) TABS tablet Take 1 tablet by mouth daily at 12 noon.   Yes [provider]  valACYclovir (VALTREX) 1000 MG tablet Take 1,000 mg by mouth as needed (break out).    Yes [provider]  famotidine (PEPCID) 10 MG tablet Take 10 mg by mouth 2 (two) times daily.    [provider]  ferrous sulfate (FERROUSUL) 325 (65 FE) MG tablet Take 1 tablet (325 mg total) by mouth 2 (two) times daily. Patient not taking: Reported on 01/05/2020 08/07/19   Aviva Signs, CNM  hydrocortisone-pramoxine [facility]) rectal foam Place 1 applicator rectally 2 (two) times daily. Patient taking differently: Place 1 applicator rectally as needed for hemorrhoids.  09/20/19   Gerrit Heck, CNM  metoCLOPramide (REGLAN) 10 MG tablet Take 1 tablet (10 mg total) by mouth every 6 (six) hours. 11/12/19   Wurst, [place], PA-C  ondansetron (ZOFRAN) 4 MG tablet Take 4 mg by mouth every 8 (eight) hours as needed for nausea or vomiting.    [provider]  scopolamine (TRANSDERM-SCOP) 1 MG/3DAYS Place 1 patch onto the skin every 3 (three) days.    [provider]  zolpidem (AMBIEN) 5 MG tablet Take 5 mg by mouth at bedtime as needed for sleep.     [provider]  pantoprazole (PROTONIX) 20 MG tablet Take 1 tablet (20 mg total) by mouth daily. 09/17/19 11/12/19  Aviva Signs, CNM    Review of Systems: Constitutional: Negative   HENT: positive for HA  Eyes: positive for visual changes   Respiratory: Negative   Cardiovascular: Negative   Gastrointestinal: Negative  Genitourinary: negative for bloody show, negative for LOF   Musculoskeletal: positive for bilat knee pain   Skin: Negative   Neurological: Negative   Endo/Heme/Allergies: Negative   Psychiatric/Behavioral: Negative    Physical Exam: VS: Blood pressure 116/69, pulse 93, temperature 98.6  F (37 C), temperature source Oral, resp. rate 16, height 5' 5.5" (1.664 m), weight 76.1 kg, last menstrual period 07/06/2019, SpO2 100 %. AAO x3, no signs of distress Cardiovascular: heart rate regular, pulses equally bilat Respiratory: unlabored GU/GI: Abdomen gravid, non-tender, non-distended, active FM, vertex per Leopold's Nuero: neg clonus, +2 reflexes bilat Extremities: no edema, negative for pain, tenderness, and cords  Cervical exam: Deferred, no labor complaints FHR: baseline rate 135 / variability moderate / accelerations present / absent decelerations TOCO: occasional ctx per TOCO   Prenatal Transfer Tool  Maternal Diabetes: No Genetic Screening: Normal Maternal Ultrasounds/Referrals: Normal and Isolated EIF (echogenic intracardiac focus) declined amnio Fetal Ultrasounds or other Referrals:  None Maternal Substance Abuse:  No Significant Maternal Medications:  Meds include: Other: Valtrex  Significant Maternal Lab Results: Group B Strep negative    Assessment: 24 y.o. [id number] [redacted]w[redacted]d Acute intractable HA Visual disturbances HSV positive  FHR category 1 GBS negative   Plan:  23 hr OBS Pre-eclampsia labs Head CT w/o contrast Regular diet Will revisit HA cocktail of pt agrees Spec exam if admitted for labor  Plan of care developed w/ Dr.  Evorn Gong MSN, CNM 03/14/2020 9:01 PM

## 2020-03-14 NOTE — MAU Note (Signed)
Pt reports having headache for the past seven days.  Also report having blurry vision and also swelling in BLE.  Pt states that it started after she was prescribed Procardia.  +FM.

## 2020-03-15 DIAGNOSIS — O26893 Other specified pregnancy related conditions, third trimester: Secondary | ICD-10-CM

## 2020-03-15 DIAGNOSIS — R519 Headache, unspecified: Secondary | ICD-10-CM

## 2020-03-15 DIAGNOSIS — Z3A36 36 weeks gestation of pregnancy: Secondary | ICD-10-CM | POA: Diagnosis not present

## 2020-03-15 MED ORDER — BETAMETHASONE SOD PHOS & ACET 6 (3-3) MG/ML IJ SUSP
12.0000 mg | INTRAMUSCULAR | Status: AC
  Administered 2020-03-15 – 2020-03-16 (×2): 12 mg via INTRAMUSCULAR
  Filled 2020-03-15: qty 5

## 2020-03-15 NOTE — Consult Note (Signed)
MFM Note  Melanie Hancock is a 24 year old gravida 5 para 3-0-1-3 who is currently at 36 weeks and 1 day.  She was admitted for observation due to a persistent headache along with visual changes.  The patient reports that the headache is throbbing at her temples and the pain radiates down her neck.  She reports that she is also seeing spots in front of her eyes.  She reports that the headache started about a week ago after she was treated with 4 doses of Procardia for preterm labor symptoms.  The patient also reports that she has been treated with Percocet and Flexeril for significant lower pelvic pain that could possibly be related to pubic symphysis separation.  She also reports that she has had trouble walking due to the significant lower pelvic pain.  She has been evaluated in the MAU multiple times in the past for evaluation of multiple complaints.   The patient's blood pressures have been in the 110s to 120s over 50s to 60s range.  Her PIH labs have been within normal limits.  Her P to C ratio was unable to be calculated due to low protein levels.  She had a head CT which did not show any acute pathology.  Her nonstress test has been reactive.  Her past OB history includes 3 normal spontaneous vaginal deliveries and an induced abortion.    Her past surgical history includes a D&C and wisdom tooth extraction.  She denies any significant past medical history.  The patient was advised that the cause of her headaches remains undetermined.  Based on her normal blood pressures, her negative P/C ratio, and her normal PIH labs, her headache is probably not due to severe preeclampsia.  As she is still complaining of a severe headache at this time along with visual changes, I would recommend that she receive a complete course of antenatal corticosteroids as she may require a late preterm delivery.    Since she has presented multiple times in the past to the MAU asking to be delivered, there is some  suggestion that the patient is reporting symptoms so that she can induced.  Although this may be true, I would be hesitant to discount her complaints.    She may be offered medications for treatment of her headache.  I will see her again tomorrow to determine if her symptoms persist.  Should her symptoms resolve or improve, she may be discharged home.  An induction of labor may be considered at around 37 weeks.    This note was copied from an original document that was generated on Microsoft word.  Recommendations: Administer a complete course of antenatal corticosteroids Offer medication for treatment of her headaches Discharge home may be considered tomorrow should her symptoms improve Delivery may be considered at around 37 weeks should she be discharged home

## 2020-03-15 NOTE — Plan of Care (Signed)
  Problem: Education: Goal: Knowledge of disease or condition will improve 03/15/2020 0037 by Bobbye Morton, RN Outcome: Progressing 03/15/2020 0037 by Bobbye Morton, RN Outcome: Progressing   Problem: Education: Goal: Knowledge of the prescribed therapeutic regimen will improve 03/15/2020 0037 by Bobbye Morton, RN Outcome: Progressing 03/15/2020 0037 by Bobbye Morton, RN Outcome: Progressing   Problem: Education: Goal: Individualized Educational Video(s) 03/15/2020 0037 by Bobbye Morton, RN Outcome: Progressing 03/15/2020 0037 by Bobbye Morton, RN Outcome: Progressing   Problem: Clinical Measurements: Goal: Complications related to the disease process, condition or treatment will be avoided or minimized 03/15/2020 0037 by Bobbye Morton, RN Outcome: Progressing 03/15/2020 0037 by Bobbye Morton, RN Outcome: Progressing

## 2020-03-15 NOTE — Progress Notes (Signed)
Melanie Hancock is a 24 y.o. [id number] at [redacted]w[redacted]d admitted for OBS for HA management.   Subjective:  HA has improved since admission w/ food. Pt declines medicinal HA treatment, but request sleep aid.   Objective: BP (!) 116/58 (BP Location: Right Arm)   Pulse 76   Temp 98.6 F (37 C) (Oral)   Resp 18   Ht 5' 5.5" (1.664 m)   Wt 76.1 kg   LMP 07/06/2019   SpO2 100%   BMI 27.50 kg/m  No intake/output data recorded. No intake/output data recorded.  FHT:  FHR: 130 bpm, variability: moderate,  accelerations:  Present,  decelerations:  Absent UC:   none SVE:      Labs: Lab Results  Component Value Date   WBC 6.4 03/14/2020   HGB 8.7 (L) 03/14/2020   HCT 27.2 (L) 03/14/2020   MCV 83.2 03/14/2020   PLT 331 03/14/2020   CMP     Component Value Date/Time   NA 135 03/14/2020 2047   NA 142 09/23/2018 1753   NA 136 02/01/2016 1453   K 3.7 03/14/2020 2047   K 3.4 (L) 02/01/2016 1453   CL 105 03/14/2020 2047   CO2 23 03/14/2020 2047   CO2 24 02/01/2016 1453   GLUCOSE 96 03/14/2020 2047   GLUCOSE 80 02/01/2016 1453   BUN <5 (L) 03/14/2020 2047   BUN 9 09/23/2018 1753   BUN 5.3 (L) 02/01/2016 1453   CREATININE 0.53 03/14/2020 2047   CREATININE 0.7 02/01/2016 1453   CALCIUM 8.5 (L) 03/14/2020 2047   CALCIUM 9.0 02/01/2016 1453   PROT 5.6 (L) 03/14/2020 2047   PROT 7.3 09/23/2018 1753   PROT 6.8 02/01/2016 1453   ALBUMIN 2.4 (L) 03/14/2020 2047   ALBUMIN 4.4 09/23/2018 1753   ALBUMIN 2.7 (L) 02/01/2016 1453   AST 21 03/14/2020 2047   AST 13 02/01/2016 1453   ALT 25 03/14/2020 2047   ALT 9 02/01/2016 1453   ALKPHOS 121 03/14/2020 2047   ALKPHOS 135 02/01/2016 1453   BILITOT 0.6 03/14/2020 2047   BILITOT 0.7 09/23/2018 1753   BILITOT 0.54 02/01/2016 1453   GFRNONAA >60 03/14/2020 2047   GFRAA >60 03/14/2020 2047   PCR-too low to calculate  Assessment: 24 y.o. [id number] [redacted]w[redacted]d Acute intractable HA    -improving since meal consumed Visual disturbances HSV  positive FHR category 1 GBS negative   Plan:    23 hr OBS Pre-eclampsia labs- grossly normal Head CT w/o contrast-normal Spec exam if admitted for labor MFM consult in AM  Roma Schanz MSN, CNM 03/15/2020, 12:28 AM

## 2020-03-15 NOTE — Progress Notes (Addendum)
Hospital day #1 pregnancy at [redacted]w[redacted]d with intractable headache.  S: Pt sitting in the bed eating lunch. States her headache is 10/10 on pain scale. Has taken Tylenol without relief. Declines any further medication and states "I don't want to take anything that doesn't work". States she had pelvic pain that is also 10/10 and Percocet does not work. States she would like to be induced, claiming that her sister was induced in March one month early for "low blood pressure". Repeatedly states that "there is something wrong with my body". Pt inquired if we had tested her for preeclampsia. Reassured patient that all labs were normal and her BP was normal.         Perception of contractions: none      Vaginal bleeding: none now       Vaginal discharge:  no significant change  O: BP 116/69 (BP Location: Left Arm)   Pulse 88   Temp 98.1 F (36.7 C) (Oral)   Resp 18   Ht 5' 5.5" (1.664 m)   Wt 76.1 kg   LMP 07/06/2019   SpO2 99%   BMI 27.50 kg/m       Fetal tracings: Baseline 125bpm, moderate variability, + accels, no decels      Contractions: occasional      Uterus gravid and non-tender      Extremities: extremities normal, atraumatic, no cyanosis or edema and no significant edema and no signs of DVT   Results for orders placed or performed during the hospital encounter of 03/14/20 (from the past 24 hour(s))  Urinalysis, Routine w reflex microscopic     Status: None   Collection Time: 03/14/20  5:21 PM  Result Value Ref Range   Color, Urine YELLOW YELLOW   APPearance CLEAR CLEAR   Specific Gravity, Urine 1.010 1.005 - 1.030   pH 8.0 5.0 - 8.0   Glucose, UA NEGATIVE NEGATIVE mg/dL   Hgb urine dipstick NEGATIVE NEGATIVE   Bilirubin Urine NEGATIVE NEGATIVE   Ketones, ur NEGATIVE NEGATIVE mg/dL   Protein, ur NEGATIVE NEGATIVE mg/dL   Nitrite NEGATIVE NEGATIVE   Leukocytes,Ua NEGATIVE NEGATIVE  Protein / creatinine ratio, urine     Status: None   Collection Time: 03/14/20  8:23 PM  Result  Value Ref Range   Creatinine, Urine 59.69 mg/dL   Total Protein, Urine <6 mg/dL   Protein Creatinine Ratio        0.00 - 0.15 mg/mg[Cre]  Uric acid     Status: Abnormal   Collection Time: 03/14/20  8:47 PM  Result Value Ref Range   Uric Acid, Serum 2.3 (L) 2.5 - 7.1 mg/dL  CBC with Differential/Platelet     Status: Abnormal   Collection Time: 03/14/20  8:47 PM  Result Value Ref Range   WBC 6.4 4.0 - 10.5 K/uL   RBC 3.27 (L) 3.87 - 5.11 MIL/uL   Hemoglobin 8.7 (L) 12.0 - 15.0 g/dL   HCT 12.4 (L) 36 - 46 %   MCV 83.2 80.0 - 100.0 fL   MCH 26.6 26.0 - 34.0 pg   MCHC 32.0 30.0 - 36.0 g/dL   RDW 58.0 99.8 - 33.8 %   Platelets 331 150 - 400 K/uL   nRBC 0.0 0.0 - 0.2 %   Neutrophils Relative % 63 %   Neutro Abs 4.1 1.7 - 7.7 K/uL   Lymphocytes Relative 22 %   Lymphs Abs 1.4 0.7 - 4.0 K/uL   Monocytes Relative 10 %   Monocytes Absolute 0.7  0 - 1 K/uL   Eosinophils Relative 3 %   Eosinophils Absolute 0.2 0 - 0 K/uL   Basophils Relative 1 %   Basophils Absolute 0.0 0 - 0 K/uL   Immature Granulocytes 1 %   Abs Immature Granulocytes 0.03 0.00 - 0.07 K/uL  Comprehensive metabolic panel     Status: Abnormal   Collection Time: 03/14/20  8:47 PM  Result Value Ref Range   Sodium 135 135 - 145 mmol/L   Potassium 3.7 3.5 - 5.1 mmol/L   Chloride 105 98 - 111 mmol/L   CO2 23 22 - 32 mmol/L   Glucose, Bld 96 70 - 99 mg/dL   BUN <5 (L) 6 - 20 mg/dL   Creatinine, Ser 1.88 0.44 - 1.00 mg/dL   Calcium 8.5 (L) 8.9 - 10.3 mg/dL   Total Protein 5.6 (L) 6.5 - 8.1 g/dL   Albumin 2.4 (L) 3.5 - 5.0 g/dL   AST 21 15 - 41 U/L   ALT 25 0 - 44 U/L   Alkaline Phosphatase 121 38 - 126 U/L   Total Bilirubin 0.6 0.3 - 1.2 mg/dL   GFR calc non Af Amer >60 >60 mL/min   GFR calc Af Amer >60 >60 mL/min   Anion gap 7 5 - 15  Type and screen  [facility]     Status: None   Collection Time: 03/14/20  9:13 PM  Result Value Ref Range   ABO/RH(D) A POS    Antibody Screen NEG    Sample  Expiration      03/17/2020,2359 Performed at [facility], [address] [place], [place] [address]   SARS Coronavirus 2 by RT PCR (hospital order, performed in [facility] hospital lab) Nasopharyngeal Nasopharyngeal Swab     Status: None   Collection Time: 03/14/20  9:30 PM   Specimen: Nasopharyngeal Swab  Result Value Ref Range   SARS Coronavirus 2 NEGATIVE NEGATIVE   No current facility-administered medications on file prior to encounter.   Current Outpatient Medications on File Prior to Encounter  Medication Sig Dispense Refill  . ondansetron (ZOFRAN) 4 MG tablet Take 4 mg by mouth every 8 (eight) hours as needed for nausea or vomiting.    Marland Kitchen oxyCODONE-acetaminophen (PERCOCET/ROXICET) 5-325 MG tablet Take 0.5 tablets by mouth every 4 (four) hours as needed for severe pain.     . Prenatal Vit-Fe Fumarate-FA (PRENATAL MULTIVITAMIN) TABS tablet Take 1 tablet by mouth daily at 12 noon.    Marland Kitchen scopolamine (TRANSDERM-SCOP) 1 MG/3DAYS Place 1 patch onto the skin as needed (nausea).     . valACYclovir (VALTREX) 1000 MG tablet Take 1,000 mg by mouth daily.     Marland Kitchen acetaminophen (TYLENOL 8 HOUR) 650 MG CR tablet Take 1 tablet (650 mg total) by mouth every 8 (eight) hours as needed for pain. (Patient not taking: Reported on 03/15/2020) 30 tablet 0  . ferrous sulfate (FERROUSUL) 325 (65 FE) MG tablet Take 1 tablet (325 mg total) by mouth 2 (two) times daily. (Patient not taking: Reported on 01/05/2020) 60 tablet 1  . hydrocortisone-pramoxine (PROCTOFOAM-HC) rectal foam Place 1 applicator rectally 2 (two) times daily. (Patient not taking: Reported on 03/15/2020) 10 g 1  . metoCLOPramide (REGLAN) 10 MG tablet Take 1 tablet (10 mg total) by mouth every 6 (six) hours. (Patient not taking: Reported on 03/15/2020) 20 tablet 0  . [DISCONTINUED] pantoprazole (PROTONIX) 20 MG tablet Take 1 tablet (20 mg total) by mouth daily. 30 tablet 2  A: [redacted]w[redacted]d with intractable headache and pelvic pain     Category 1  FHT      P: Awaiting consult from MFM on recommendations.      Dr. Parke Poisson will see patient today and provide recommendations.   Dr. Mora Appl updated on plan of care.   June Leap CNM, MSN 03/15/2020 3:19 PM   Addendum: Sherron Monday to Dr. Parke Poisson at [phone]. Recommended course of betamethsone, observation overnight, and IOL at 37 weeks. Dr. Parke Poisson will see patient tomorrow morning. Likely discharge home tomorrow to return Monday for IOL.

## 2020-03-16 MED ORDER — ONDANSETRON 4 MG PO TBDP
4.0000 mg | ORAL_TABLET | Freq: Three times a day (TID) | ORAL | Status: DC | PRN
  Administered 2020-03-16: 4 mg via ORAL
  Filled 2020-03-16: qty 1

## 2020-03-16 MED ORDER — FAMOTIDINE 20 MG PO TABS
20.0000 mg | ORAL_TABLET | Freq: Once | ORAL | Status: AC
  Administered 2020-03-16: 20 mg via ORAL
  Filled 2020-03-16: qty 1

## 2020-03-16 MED ORDER — VALACYCLOVIR HCL 1 G PO TABS: 1000.0000 mg | ORAL_TABLET | Freq: Every day | ORAL | 0 refills | Status: AC

## 2020-03-16 NOTE — Discharge Summary (Signed)
Antenatal Discharge Summary  Date of Service updated 03/16/20    Patient Name: Melanie Hancock DOB: 04-16-1996 MRN: [id number]  Date of admission: 03/14/2020 Delivery date:This patient has no babies on file. Delivering provider: This patient has no babies on file. Date of discharge: 03/16/2020  Admitting diagnosis: Acute intractable headache [R51.9] Intrauterine pregnancy: [redacted]w[redacted]d    Secondary diagnosis:  Active Problems:   Acute intractable headache  Additional problems:  Patient Active Problem List   Diagnosis Date Noted  . Acute intractable headache 03/14/2020  . Vaginal delivery 02/04/2016  . History of postpartum depression, currently pregnant 01/23/2016  . Iron deficiency anemia 01/02/2016  . 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 Trichomoniasis 06/17/2015  . Anxiety 03/28/2014  . Chronic back pain 03/28/2014  . Hx pyelonephritis 2014 05/26/2013       Discharge diagnosis: Preterm undelivered                                               Hospital course: Discharged undelivered. PIH work-up and CT of the head. Antenatal steroids.   Magnesium Sulfate received: No BMZ received: Yes Rhophylac:N/A MMR:N/A T-DaP:Given prenatally Flu: declines Transfusion:No  Physical exam  Vitals:   03/15/20 2300 03/15/20 2315 03/16/20 0600 03/16/20 0751  BP:  119/65 98/67 (!) 118/62  Pulse:  94 83 103  Resp:  '18 18 18  ' Temp:  98.1 F (36.7 C) 97.9 F (36.6 C) 98.1 F (36.7 C)  TempSrc:  Oral Oral Oral  SpO2: 99% 99% 99% 100%  Weight:      Height:       General: alert and cooperative  FHT:  FHR: 120 bpm, variability: moderate,  accelerations:  Present,  decelerations:  Absent UC:   Irregular periods of irritability SVE:    deferred DVT Evaluation: No evidence of DVT seen on physical exam. No cords or calf tenderness. No significant calf/ankle edema. Labs: Lab Results  Component Value Date   WBC 6.4 03/14/2020   HGB 8.7 (L) 03/14/2020   HCT 27.2  (L) 03/14/2020   MCV 83.2 03/14/2020   PLT 331 03/14/2020   CMP Latest Ref Rng & Units 03/14/2020  Glucose 70 - 99 mg/dL 96  BUN 6 - 20 mg/dL <5(L)  Creatinine 0.44 - 1.00 mg/dL 0.53  Sodium 135 - 145 mmol/L 135  Potassium 3.5 - 5.1 mmol/L 3.7  Chloride 98 - 111 mmol/L 105  CO2 22 - 32 mmol/L 23  Calcium 8.9 - 10.3 mg/dL 8.5(L)  Total Protein 6.5 - 8.1 g/dL 5.6(L)  Total Bilirubin 0.3 - 1.2 mg/dL 0.6  Alkaline Phos 38 - 126 U/L 121  AST 15 - 41 U/L 21  ALT 0 - 44 U/L 25   Edinburgh Score: No flowsheet data found.    After visit meds:  Allergies as of 03/16/2020   No Known Allergies     Medication List    STOP taking these medications   hydrocortisone-pramoxine rectal foam Commonly known as: PROCTOFOAM-HC   ondansetron 4 MG tablet Commonly known as: ZOFRAN   oxyCODONE-acetaminophen 5-325 MG tablet Commonly known as: PERCOCET/ROXICET   scopolamine 1 MG/3DAYS Commonly known as: TRANSDERM-SCOP     TAKE these medications   acetaminophen 650 MG CR tablet Commonly known as: Tylenol 8 Hour Take 1 tablet (650 mg total) by mouth every 8 (eight) hours as needed for  pain.   ferrous sulfate 325 (65 FE) MG tablet Commonly known as: FerrouSul Take 1 tablet (325 mg total) by mouth 2 (two) times daily.   metoCLOPramide 10 MG tablet Commonly known as: REGLAN Take 1 tablet (10 mg total) by mouth every 6 (six) hours.   prenatal multivitamin Tabs tablet Take 1 tablet by mouth daily at 12 noon.   valACYclovir 1000 MG tablet Commonly known as: VALTREX Take 1 tablet (1,000 mg total) by mouth daily for 10 days.        Discharge home in stable condition Discharge instruction: Preterm labor precautions Activity: Advance as tolerated  Diet: routine diet   Follow-up [facility]. Go to.   Specialty: Obstetrics and Gynecology Why: Go to next scheduled appointment this week.  Contact information: [address] [place] [address] [phone]                  03/16/2020 Arrie Eastern, CNM

## 2020-03-16 NOTE — Progress Notes (Signed)
Melanie Hancock is a 24 y.o. Q7H4193 at [redacted]w[redacted]d admitted for persistent headache, visual changes, and pelvic pain.  Subjective:  C/O HA that was not relieved after Tylenol last used 7/27 @ 0344 and declines to attempt further treatment options. States the HA is temporal in location, throbbing and constant. Reports little to no sleep even w/ oral sleep aids. Describes visual changes as intermittent, seeing stars, and hazy around her peripheral fields. The pelvic pain is noted during any position changes and when ambulating. Pt has declines meds  And position techniques to mitigate pelvic pain. Requests cervical exam. Ni uterine activity per TOCO or palpated. Explained the reason for cervical exams to guide the plan of care. Pt agrees to defer cervical exam at this time.   Objective: BP (!) 118/62 (BP Location: Left Arm)   Pulse 103   Temp 98.1 F (36.7 C) (Oral)   Resp 18   Ht 5' 5.5" (1.664 m)   Wt 76.1 kg   LMP 07/06/2019   SpO2 100%   BMI 27.50 kg/m  No intake/output data recorded. No intake/output data recorded.  FHT:  FHR: 120 bpm, variability: moderate,  accelerations:  Present,  decelerations:  Absent UC:   Irregular periods of irritability SVE:    deferred  Labs: Lab Results  Component Value Date   WBC 6.4 03/14/2020   HGB 8.7 (L) 03/14/2020   HCT 27.2 (L) 03/14/2020   MCV 83.2 03/14/2020   PLT 331 03/14/2020    Assessment / Plan: 22 [id number].X.[id number] [redacted]w[redacted]d Acute intractable HA    -declines treatment Visual disturbances Pelvic pain    -declines treatment options HSV positive FHR category1    -NST Q shift GBSnegative BMZ #2 today @ 1630  MFM recommendations: Administer a complete course of antenatal corticosteroids Offer medication for treatment of her headaches Discharge home may be considered tomorrow should her symptoms improve Delivery may be considered at around 37 weeks should she be discharged home   Roma Schanz MSN, CNM 03/16/2020, 10:40 AM

## 2020-03-16 NOTE — Consult Note (Signed)
MFM Note  The patient is still complaining of a headache primarily in the temporal area.  She reports that she took some Tylenol this morning and the headache is slightly better.  She reports feeling fetal movements.  Her blood pressures remain in the normal range.  She is receiving a complete course of antenatal corticosteroids.  As the patient seems to be doing better today, she may be discharged home after she receives the second dose of steroids later this afternoon.  She already has an induction of labor scheduled at around 37 weeks.    Preeclampsia precautions were reviewed with the patient today.  She understands that she should return to the hospital should her headaches get worse.  She was also advised to continue to monitor fetal movements.

## 2020-03-16 NOTE — Progress Notes (Signed)
Discharge instructions and prescriptions given to pt. Discussed signs and symptoms to report to the MD, upcoming appointments, and meds. Pt verbalizes understanding and has no questions or concerns at this time. Pt discharged from hospital in stable condition. 

## 2020-03-17 ENCOUNTER — Telehealth (HOSPITAL_COMMUNITY): Payer: Self-pay | Admitting: *Deleted

## 2020-03-17 ENCOUNTER — Encounter (HOSPITAL_COMMUNITY): Payer: Self-pay | Admitting: *Deleted

## 2020-03-17 NOTE — Telephone Encounter (Signed)
Preadmission screen Pt c/o blurred vision and headache.  DrKulwa notified and pt instructed to go to MAU.  If not comfortable with going to MAU please call the provider on call at the office.  Pt verbalized understanding of plan.

## 2020-03-19 ENCOUNTER — Other Ambulatory Visit (HOSPITAL_COMMUNITY)
Admission: RE | Admit: 2020-03-19 | Discharge: 2020-03-19 | Disposition: A | Discharge status: Home / Self Care | Payer: Medicaid Other | Source: Ambulatory Visit | Attending: Obstetrics and Gynecology | Admitting: Obstetrics and Gynecology

## 2020-03-19 DIAGNOSIS — Z20822 Contact with and (suspected) exposure to covid-19: Secondary | ICD-10-CM | POA: Diagnosis not present

## 2020-03-19 DIAGNOSIS — Z01812 Encounter for preprocedural laboratory examination: Secondary | ICD-10-CM | POA: Diagnosis not present

## 2020-03-19 LAB — SARS CORONAVIRUS 2 (TAT 6-24 HRS): SARS Coronavirus 2: NEGATIVE

## 2020-03-20 ENCOUNTER — Inpatient Hospital Stay (HOSPITAL_COMMUNITY): Payer: Medicaid Other

## 2020-03-21 ENCOUNTER — Inpatient Hospital Stay (HOSPITAL_COMMUNITY)
Admission: AD | Admit: 2020-03-21 | Discharge: 2020-03-24 | DRG: 806 | Disposition: A | Discharge status: Home / Self Care | Payer: Medicaid Other | Attending: Obstetrics & Gynecology | Admitting: Obstetrics & Gynecology

## 2020-03-21 ENCOUNTER — Inpatient Hospital Stay (HOSPITAL_COMMUNITY): Payer: Medicaid Other

## 2020-03-21 ENCOUNTER — Other Ambulatory Visit: Payer: Self-pay

## 2020-03-21 ENCOUNTER — Inpatient Hospital Stay (HOSPITAL_COMMUNITY): Admission: RE | Admit: 2020-03-21 | Payer: Medicaid Other | Source: Home / Self Care

## 2020-03-21 ENCOUNTER — Encounter (HOSPITAL_COMMUNITY): Payer: Self-pay | Admitting: Obstetrics and Gynecology

## 2020-03-21 DIAGNOSIS — Z87891 Personal history of nicotine dependence: Secondary | ICD-10-CM | POA: Diagnosis not present

## 2020-03-21 DIAGNOSIS — A6000 Herpesviral infection of urogenital system, unspecified: Secondary | ICD-10-CM | POA: Diagnosis present

## 2020-03-21 DIAGNOSIS — D509 Iron deficiency anemia, unspecified: Secondary | ICD-10-CM | POA: Diagnosis present

## 2020-03-21 DIAGNOSIS — R519 Headache, unspecified: Secondary | ICD-10-CM | POA: Diagnosis present

## 2020-03-21 DIAGNOSIS — O9902 Anemia complicating childbirth: Secondary | ICD-10-CM | POA: Diagnosis present

## 2020-03-21 DIAGNOSIS — Z3A37 37 weeks gestation of pregnancy: Secondary | ICD-10-CM | POA: Diagnosis not present

## 2020-03-21 DIAGNOSIS — O9832 Other infections with a predominantly sexual mode of transmission complicating childbirth: Secondary | ICD-10-CM | POA: Diagnosis present

## 2020-03-21 DIAGNOSIS — O26893 Other specified pregnancy related conditions, third trimester: Secondary | ICD-10-CM | POA: Diagnosis present

## 2020-03-21 DIAGNOSIS — Z3490 Encounter for supervision of normal pregnancy, unspecified, unspecified trimester: Secondary | ICD-10-CM

## 2020-03-21 LAB — COMPREHENSIVE METABOLIC PANEL
ALT: 24 U/L (ref 0–44)
AST: 26 U/L (ref 15–41)
Albumin: 2.8 g/dL — ABNORMAL LOW (ref 3.5–5.0)
Alkaline Phosphatase: 145 U/L — ABNORMAL HIGH (ref 38–126)
Anion gap: 9 (ref 5–15)
BUN: 6 mg/dL (ref 6–20)
CO2: 22 mmol/L (ref 22–32)
Calcium: 8.8 mg/dL — ABNORMAL LOW (ref 8.9–10.3)
Chloride: 104 mmol/L (ref 98–111)
Creatinine, Ser: 0.5 mg/dL (ref 0.44–1.00)
GFR calc Af Amer: >60 mL/min (ref >60)
GFR calc non Af Amer: >60 mL/min (ref >60)
Glucose, Bld: 77 mg/dL (ref 70–99)
Potassium: 3.6 mmol/L (ref 3.5–5.1)
Sodium: 135 mmol/L (ref 135–145)
Total Bilirubin: 0.7 mg/dL (ref 0.3–1.2)
Total Protein: 6.3 g/dL — ABNORMAL LOW (ref 6.5–8.1)

## 2020-03-21 LAB — TYPE AND SCREEN
ABO/RH(D): A POS
Antibody Screen: NEGATIVE

## 2020-03-21 LAB — CBC
HCT: 30.1 % — ABNORMAL LOW (ref 36.0–46.0)
Hemoglobin: 9.3 g/dL — ABNORMAL LOW (ref 12.0–15.0)
MCH: 25.5 pg — ABNORMAL LOW (ref 26.0–34.0)
MCHC: 30.9 g/dL (ref 30.0–36.0)
MCV: 82.7 fL (ref 80.0–100.0)
Platelets: 369 10*3/uL (ref 150–400)
RBC: 3.64 MIL/uL — ABNORMAL LOW (ref 3.87–5.11)
RDW: 14.8 % (ref 11.5–15.5)
WBC: 6.4 10*3/uL (ref 4.0–10.5)
nRBC: 0 % (ref 0.0–0.2)

## 2020-03-21 LAB — RAPID URINE DRUG SCREEN, HOSP PERFORMED
Amphetamines: NOT DETECTED
Barbiturates: NOT DETECTED
Benzodiazepines: NOT DETECTED
Cocaine: NOT DETECTED
Opiates: NOT DETECTED
Tetrahydrocannabinol: NOT DETECTED

## 2020-03-21 LAB — PROTEIN / CREATININE RATIO, URINE
Creatinine, Urine: 43.21 mg/dL
Total Protein, Urine: <6 mg/dL

## 2020-03-21 MED ORDER — FENTANYL-BUPIVACAINE-NACL 0.5-0.125-0.9 MG/250ML-% EP SOLN
12.0000 mL/h | EPIDURAL | Status: DC | PRN
  Filled 2020-03-21: qty 250

## 2020-03-21 MED ORDER — OXYCODONE-ACETAMINOPHEN 5-325 MG PO TABS: 1.0000 | ORAL_TABLET | ORAL | Status: DC | PRN

## 2020-03-21 MED ORDER — PHENYLEPHRINE 40 MCG/ML (10ML) SYRINGE FOR IV PUSH (FOR BLOOD PRESSURE SUPPORT): 80.0000 ug | PREFILLED_SYRINGE | INTRAVENOUS | Status: DC | PRN

## 2020-03-21 MED ORDER — LACTATED RINGERS IV SOLN: INTRAVENOUS | Status: DC

## 2020-03-21 MED ORDER — LIDOCAINE HCL (PF) 1 % IJ SOLN: 30.0000 mL | INTRAMUSCULAR | Status: DC | PRN

## 2020-03-21 MED ORDER — TERBUTALINE SULFATE 1 MG/ML IJ SOLN: 0.2500 mg | Freq: Once | INTRAMUSCULAR | Status: DC | PRN

## 2020-03-21 MED ORDER — OXYCODONE-ACETAMINOPHEN 5-325 MG PO TABS: 2.0000 | ORAL_TABLET | ORAL | Status: DC | PRN

## 2020-03-21 MED ORDER — MISOPROSTOL 25 MCG QUARTER TABLET
ORAL_TABLET | ORAL | Status: AC
  Filled 2020-03-21: qty 1

## 2020-03-21 MED ORDER — SOD CITRATE-CITRIC ACID 500-334 MG/5ML PO SOLN: 30.0000 mL | ORAL | Status: DC | PRN

## 2020-03-21 MED ORDER — MISOPROSTOL 50MCG HALF TABLET
ORAL_TABLET | ORAL | Status: AC
  Filled 2020-03-21: qty 1

## 2020-03-21 MED ORDER — OXYTOCIN BOLUS FROM INFUSION
333.0000 mL | Freq: Once | INTRAVENOUS | Status: AC
  Administered 2020-03-22: 333 mL via INTRAVENOUS

## 2020-03-21 MED ORDER — EPHEDRINE 5 MG/ML INJ: 10.0000 mg | INTRAVENOUS | Status: DC | PRN

## 2020-03-21 MED ORDER — FLEET ENEMA 7-19 GM/118ML RE ENEM: 1.0000 | ENEMA | RECTAL | Status: DC | PRN

## 2020-03-21 MED ORDER — MISOPROSTOL 50MCG HALF TABLET
50.0000 ug | ORAL_TABLET | ORAL | Status: DC | PRN
  Administered 2020-03-21: 50 ug via BUCCAL

## 2020-03-21 MED ORDER — LACTATED RINGERS IV SOLN: 500.0000 mL | INTRAVENOUS | Status: DC | PRN

## 2020-03-21 MED ORDER — ONDANSETRON HCL 4 MG/2ML IJ SOLN
4.0000 mg | Freq: Four times a day (QID) | INTRAMUSCULAR | Status: DC | PRN
  Administered 2020-03-22 (×2): 4 mg via INTRAVENOUS
  Filled 2020-03-21 (×2): qty 2

## 2020-03-21 MED ORDER — LACTATED RINGERS IV SOLN
500.0000 mL | Freq: Once | INTRAVENOUS | Status: AC
  Administered 2020-03-22: 500 mL via INTRAVENOUS

## 2020-03-21 MED ORDER — FENTANYL CITRATE (PF) 100 MCG/2ML IJ SOLN
100.0000 ug | INTRAMUSCULAR | Status: DC | PRN
  Administered 2020-03-22 (×3): 100 ug via INTRAVENOUS
  Filled 2020-03-21 (×3): qty 2

## 2020-03-21 MED ORDER — MISOPROSTOL 25 MCG QUARTER TABLET
25.0000 ug | ORAL_TABLET | ORAL | Status: DC | PRN
  Administered 2020-03-21: 25 ug via VAGINAL

## 2020-03-21 MED ORDER — ACETAMINOPHEN 325 MG PO TABS: 650.0000 mg | ORAL_TABLET | ORAL | Status: DC | PRN

## 2020-03-21 MED ORDER — MISOPROSTOL 25 MCG QUARTER TABLET: 25.0000 ug | ORAL_TABLET | ORAL | Status: DC | PRN

## 2020-03-21 MED ORDER — OXYTOCIN-SODIUM CHLORIDE 30-0.9 UT/500ML-% IV SOLN
2.5000 [IU]/h | INTRAVENOUS | Status: DC
  Administered 2020-03-22: 2.5 [IU]/h via INTRAVENOUS
  Filled 2020-03-21: qty 500

## 2020-03-21 MED ORDER — DIPHENHYDRAMINE HCL 50 MG/ML IJ SOLN: 12.5000 mg | INTRAMUSCULAR | Status: DC | PRN

## 2020-03-21 NOTE — Progress Notes (Signed)
Subjective: Breathing with some of the contractions.  Support in room   Objective: BP (!) 111/57   Pulse 70   Temp 97.8 F (36.6 C) (Axillary)   Resp 18   Ht 5\' 5"  (1.651 m)   Wt 74.9 kg   LMP 07/06/2019   BMI 27.49 kg/m  No intake/output data recorded. No intake/output data recorded.  FHT: Category 1 FHT 135 accels, no decels UC:   irregular, every 3-6 minutes SVE:   Dilation: 3 Effacement (%): 50 Station: -3 Exam by:: N. Bostyn Bogie, CNM Cytotec x 2 Foley bulb placed with 60cc of fluid in balloon.  Pt tolerated procedure well Assessment:  [id number] is a 24 yo G5P3013 37 IUPweek induction per MFM for headaches Cat 1 strip Anemia Plan: Monitor progress Anticipate SVD  30 CNM, MSN 03/21/2020, 11:55 PM

## 2020-03-21 NOTE — H&P (Signed)
Melanie Hancock is a 24 y.o. female, G5P3013 at 37 weeks, presenting for induction of labor for unresolved headache and visual changes and MFM recommendation.  Prenatal hx remarkable for hx of PPD, iron deficiency anemia HSV type 1, chronic back pain and separation of her symphysis  Patient Active Problem List   Diagnosis Date Noted  . Pregnancy 03/21/2020  . Acute intractable headache 03/14/2020  . Vaginal delivery 02/04/2016  . History of postpartum depression, currently pregnant 01/23/2016  . Iron deficiency anemia 01/02/2016  . 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 Trichomoniasis 06/17/2015  . Anxiety 03/28/2014  . Chronic back pain 03/28/2014  . Hx pyelonephritis 2014 05/26/2013    History of present pregnancy: Patient entered care at 13 weeks.   EDC of 04/11/2020 was established by LMP   Anatomy scan:  20 weeks, with normal findings and an posterior placenta.   Additional [place] evaluations:  With antenatal testing AFI low normal  Significant prenatal events: Headaches and multiple visits to MAU Last evaluation: This week  OB History     Gravida  5   Para  3   Term  3   Preterm      AB  1   Living  3      SAB  0   TAB  1   Ectopic      Multiple  0   Live Births  3          Past Medical History:  Diagnosis Date  . Anemia   . Anxiety   . Blood transfusion without reported diagnosis   . Chronic back pain   . Depression   . GERD (gastroesophageal reflux disease)   . Headache   . Infection    UTI  . Post partum depression   . Pyelonephritis   . UTI (lower urinary tract infection)    Past Surgical History:  Procedure Laterality Date  . DILATION AND CURETTAGE OF UTERUS    . INDUCED ABORTION    . WISDOM TOOTH EXTRACTION     Family History: family history includes Arthritis in her maternal grandmother and mother; Asthma in her son; Healthy in her father. Social History:  reports that she quit smoking about 6 years ago. She has  never used smokeless tobacco. She reports previous alcohol use.  Drug: Marijuana.   Prenatal Transfer Tool  Maternal Diabetes: No Genetic Screening: Normal Maternal Ultrasounds/Referrals: Normal and Other: induction due to retractable headache Fetal Ultrasounds or other Referrals:  None Maternal Substance Abuse:  No Significant Maternal Medications:  None Significant Maternal Lab Results: None  TDAP UTD Flu Decclined  ROS: All 10 systems reviewed and negative except as stated above  No Known Allergies   Dilation: 1 Effacement (%): Thick Station: Ballotable Exam by:: Bernerd Pho, CNM Blood pressure 124/65, pulse 83, resp. rate 16, height 5\' 5"  (1.651 m), weight 74.9 kg, last menstrual period 07/06/2019.  Chest clear Heart RRR without murmur Abd gravid, NT, FH appropriate Pelvic: adequate Ext: Neg  FHR: Category 1 FHT 130 variability present, accels noted no decels UCs: None  Prenatal labs: ABO, Rh: --/--/A POS (07/26 2113) Antibody: NEG (07/26 2113) Rubella:   Imm RPR:   NR HBsAg:   Neg HIV: NON REACTIVE (12/17 2204)  GBS:  Neg Sickle cell/Hgb electrophoresis:  AA Pap:  2016 Negative GC:  Neg Chlamydia:  Neg Genetic screenings: Normal Glucola:  wnl Other:   Hgb, 8.7 at 28 weeks  Assessment/Plan:  Epimenio Sarin is a 24 yo G5P3013 37 IUPweek induction per MFM for headaches Cat 1 strip Anemia  Plan: Admit to Birthing Suite Routine [facility] orders Pain med/epidural prn Cytotec   Henderson Newcomer ProtheroCNM, MSN 03/21/2020, 4:58 PM

## 2020-03-22 ENCOUNTER — Inpatient Hospital Stay (HOSPITAL_COMMUNITY): Payer: Medicaid Other | Admitting: Anesthesiology

## 2020-03-22 ENCOUNTER — Encounter (HOSPITAL_COMMUNITY): Payer: Self-pay | Admitting: Obstetrics and Gynecology

## 2020-03-22 LAB — RPR: RPR Ser Ql: NONREACTIVE

## 2020-03-22 MED ORDER — ACETAMINOPHEN 325 MG PO TABS: 650.0000 mg | ORAL_TABLET | ORAL | Status: DC | PRN

## 2020-03-22 MED ORDER — SENNOSIDES-DOCUSATE SODIUM 8.6-50 MG PO TABS
2.0000 | ORAL_TABLET | ORAL | Status: DC
  Filled 2020-03-22: qty 2

## 2020-03-22 MED ORDER — IBUPROFEN 600 MG PO TABS
600.0000 mg | ORAL_TABLET | Freq: Four times a day (QID) | ORAL | Status: DC
  Administered 2020-03-22 – 2020-03-24 (×8): 600 mg via ORAL
  Filled 2020-03-22 (×8): qty 1

## 2020-03-22 MED ORDER — OXYCODONE-ACETAMINOPHEN 5-325 MG PO TABS
1.0000 | ORAL_TABLET | ORAL | Status: DC | PRN
  Administered 2020-03-23 – 2020-03-24 (×4): 1 via ORAL
  Filled 2020-03-22 (×4): qty 1

## 2020-03-22 MED ORDER — LIDOCAINE HCL (PF) 1 % IJ SOLN
INTRAMUSCULAR | Status: DC | PRN
  Administered 2020-03-22 (×2): 4 mL via EPIDURAL

## 2020-03-22 MED ORDER — DOCUSATE SODIUM 100 MG PO CAPS
100.0000 mg | ORAL_CAPSULE | Freq: Two times a day (BID) | ORAL | Status: DC
  Administered 2020-03-23 – 2020-03-24 (×3): 100 mg via ORAL
  Filled 2020-03-22 (×4): qty 1

## 2020-03-22 MED ORDER — ONDANSETRON HCL 4 MG/2ML IJ SOLN: 4.0000 mg | INTRAMUSCULAR | Status: DC | PRN

## 2020-03-22 MED ORDER — SIMETHICONE 80 MG PO CHEW: 80.0000 mg | CHEWABLE_TABLET | ORAL | Status: DC | PRN

## 2020-03-22 MED ORDER — TERBUTALINE SULFATE 1 MG/ML IJ SOLN: 0.2500 mg | Freq: Once | INTRAMUSCULAR | Status: DC | PRN

## 2020-03-22 MED ORDER — ZOLPIDEM TARTRATE 5 MG PO TABS: 5.0000 mg | ORAL_TABLET | Freq: Every evening | ORAL | Status: DC | PRN

## 2020-03-22 MED ORDER — DIPHENHYDRAMINE HCL 25 MG PO CAPS
25.0000 mg | ORAL_CAPSULE | Freq: Four times a day (QID) | ORAL | Status: DC | PRN
  Administered 2020-03-22 – 2020-03-23 (×2): 25 mg via ORAL
  Filled 2020-03-22 (×2): qty 1

## 2020-03-22 MED ORDER — SODIUM CHLORIDE (PF) 0.9 % IJ SOLN
INTRAMUSCULAR | Status: DC | PRN
  Administered 2020-03-22: 12 mL/h via EPIDURAL

## 2020-03-22 MED ORDER — ONDANSETRON HCL 4 MG PO TABS: 4.0000 mg | ORAL_TABLET | ORAL | Status: DC | PRN

## 2020-03-22 MED ORDER — DIBUCAINE (PERIANAL) 1 % EX OINT
1.0000 "application " | TOPICAL_OINTMENT | CUTANEOUS | Status: DC | PRN
  Administered 2020-03-22: 1 via RECTAL
  Filled 2020-03-22: qty 28

## 2020-03-22 MED ORDER — COCONUT OIL OIL: 1.0000 "application " | TOPICAL_OIL | Status: DC | PRN

## 2020-03-22 MED ORDER — OXYTOCIN-SODIUM CHLORIDE 30-0.9 UT/500ML-% IV SOLN
1.0000 m[IU]/min | INTRAVENOUS | Status: DC
  Administered 2020-03-22: 2 m[IU]/min via INTRAVENOUS

## 2020-03-22 MED ORDER — WITCH HAZEL-GLYCERIN EX PADS
1.0000 "application " | MEDICATED_PAD | CUTANEOUS | Status: DC | PRN
  Administered 2020-03-22: 1 via TOPICAL

## 2020-03-22 MED ORDER — TETANUS-DIPHTH-ACELL PERTUSSIS 5-2.5-18.5 LF-MCG/0.5 IM SUSP: 0.5000 mL | Freq: Once | INTRAMUSCULAR | Status: DC

## 2020-03-22 MED ORDER — MEDROXYPROGESTERONE ACETATE 150 MG/ML IM SUSP
150.0000 mg | INTRAMUSCULAR | Status: AC | PRN
  Administered 2020-03-24: 150 mg via INTRAMUSCULAR
  Filled 2020-03-22: qty 1

## 2020-03-22 MED ORDER — PRENATAL MULTIVITAMIN CH
1.0000 | ORAL_TABLET | Freq: Every day | ORAL | Status: DC
  Administered 2020-03-23 – 2020-03-24 (×2): 1 via ORAL
  Filled 2020-03-22 (×2): qty 1

## 2020-03-22 MED ORDER — BENZOCAINE-MENTHOL 20-0.5 % EX AERO
1.0000 "application " | INHALATION_SPRAY | CUTANEOUS | Status: DC | PRN
  Administered 2020-03-22 – 2020-03-24 (×2): 1 via TOPICAL
  Filled 2020-03-22 (×2): qty 56

## 2020-03-22 NOTE — Anesthesia Procedure Notes (Signed)
Epidural Patient location during procedure: OB Start time: 03/22/2020 8:22 AM End time: 03/22/2020 8:31 AM  Staffing Anesthesiologist: Mal Amabile, MD Performed: anesthesiologist   Preanesthetic Checklist Completed: patient identified, IV checked, site marked, risks and benefits discussed, surgical consent, monitors and equipment checked, pre-op evaluation and timeout performed  Epidural Patient position: sitting Prep: DuraPrep and site prepped and draped Patient monitoring: continuous pulse ox and blood pressure Approach: midline Location: L3-L4 Injection technique: LOR air  Needle:  Needle type: Tuohy  Needle gauge: 17 G Needle length: 9 cm and 9 Needle insertion depth: 6 cm Catheter type: closed end flexible Catheter size: 19 Gauge Catheter at skin depth: 11 cm Test dose: negative and Other  Assessment Events: blood not aspirated, injection not painful, no injection resistance, no paresthesia and negative IV test  Additional Notes Patient identified. Risks and benefits discussed including failed block, incomplete  Pain control, post dural puncture headache, nerve damage, paralysis, blood pressure Changes, nausea, vomiting, reactions to medications-both toxic and allergic and post Partum back pain. All questions were answered. Patient expressed understanding and wished to proceed. Sterile technique was used throughout procedure. Epidural site was Dressed with sterile barrier dressing. No paresthesias, signs of intravascular injection Or signs of intrathecal spread were encountered.  Patient was more comfortable after the epidural was dosed. Please see RN's note for documentation of vital signs and FHR which are stable. Reason for block:procedure for pain

## 2020-03-22 NOTE — Anesthesia Preprocedure Evaluation (Addendum)
Anesthesia Evaluation  Patient identified by MRN, date of birth, ID band Patient awake    Reviewed: Allergy & Precautions, H&P , Patient's Chart, lab work & pertinent test results  Airway Mallampati: II  TM Distance: >3 FB Neck ROM: full    Dental no notable dental hx. (+) Teeth Intact   Pulmonary neg pulmonary ROS, former smoker,    Pulmonary exam normal breath sounds clear to auscultation       Cardiovascular negative cardio ROS Normal cardiovascular exam Rhythm:regular Rate:Normal     Neuro/Psych  Headaches, PSYCHIATRIC DISORDERS Anxiety Depression    GI/Hepatic negative GI ROS, Neg liver ROS, GERD  Medicated,  Endo/Other  negative endocrine ROS  Renal/GU Renal diseaseHx/o Pyelonephritis  negative genitourinary   Musculoskeletal   Abdominal   Peds  Hematology negative hematology ROS (+) Blood dyscrasia, anemia ,   Anesthesia Other Findings   Reproductive/Obstetrics (+) Pregnancy                             Anesthesia Physical Anesthesia Plan  ASA: II  Anesthesia Plan: Epidural   Post-op Pain Management:    Induction:   PONV Risk Score and Plan:   Airway Management Planned:   Additional Equipment:   Intra-op Plan:   Post-operative Plan:   Informed Consent: I have reviewed the patients History and Physical, chart, labs and discussed the procedure including the risks, benefits and alternatives for the proposed anesthesia with the patient or authorized representative who has indicated his/her understanding and acceptance.       Plan Discussed with: Anesthesiologist  Anesthesia Plan Comments:         Anesthesia Quick Evaluation

## 2020-03-23 LAB — CBC
HCT: 30.6 % — ABNORMAL LOW (ref 36.0–46.0)
Hemoglobin: 9.7 g/dL — ABNORMAL LOW (ref 12.0–15.0)
MCH: 26.6 pg (ref 26.0–34.0)
MCHC: 31.7 g/dL (ref 30.0–36.0)
MCV: 83.8 fL (ref 80.0–100.0)
Platelets: 366 10*3/uL (ref 150–400)
RBC: 3.65 MIL/uL — ABNORMAL LOW (ref 3.87–5.11)
RDW: 14.8 % (ref 11.5–15.5)
WBC: 8.3 10*3/uL (ref 4.0–10.5)
nRBC: 0 % (ref 0.0–0.2)

## 2020-03-23 NOTE — Anesthesia Postprocedure Evaluation (Signed)
Anesthesia Post Note  Patient: [name]  Procedure(s) Performed: AN AD HOC LABOR EPIDURAL     Patient location during evaluation: Mother Baby Anesthesia Type: Epidural Level of consciousness: awake and alert and oriented Pain management: satisfactory to patient Vital Signs Assessment: post-procedure vital signs reviewed and stable Respiratory status: respiratory function stable Cardiovascular status: stable Postop Assessment: no headache, epidural receding, patient able to bend at knees, no signs of nausea or vomiting, adequate PO intake and able to ambulate (The patient was concerned about soreness at the epidural insertion site. Her back was examined and it appeared within normal limits with no bruising, edema or drainage. She was reassured that some soreness was to be expected for up to a few weeks .) Anesthetic complications: no   No complications documented.  Last Vitals:  Vitals:   03/23/20 0100 03/23/20 0530  BP: 128/62 (!) 106/52  Pulse: 68 73  Resp: 16 16  Temp: 36.7 C 36.8 C  SpO2: 99%     Last Pain:  Vitals:   03/23/20 0758  TempSrc:   PainSc: 9    Pain Goal: Patients Stated Pain Goal: 1 (03/23/20 0529)              Epidural/Spinal Function Cutaneous sensation: Normal sensation (03/23/20 0758)  Karleen Dolphin

## 2020-03-23 NOTE — Clinical Social Work Maternal (Signed)
CLINICAL SOCIAL WORK MATERNAL/CHILD NOTE  Patient Details  Name: Melanie Hancock MRN: [id number] Date of Birth: 07/29/1996  Date:  03/23/2020  Clinical Social Worker Initiating Note:  Arianah Torgeson, LCSW Date/Time: Initiated:  03/23/20/0940     Child's Name:  Jahzara Wieczorek   Biological Parents:  Mother (Ailanie Dress)   Need for Interpreter:  None   Reason for Referral:  Current Substance Use/Substance Use During Pregnancy , Behavioral Health Concerns   Address:  [address] [place] [address]    Phone number:  [phone] (home)     Additional phone number: none   Household Members/Support Persons (HM/SP):   Household Member/Support Person 1   HM/SP Name Relationship DOB or Age  HM/SP -1 Gift Lunney MOB   06/15/1994  HM/SP -2   Jace Collins  son   02/04/2016  HM/SP -3        HM/SP -4        HM/SP -5        HM/SP -6        HM/SP -7        HM/SP -8          Natural Supports (not living in the home):  Parent   Professional Supports: Therapist (reported therapist in the past.)   Employment: Unemployed   Type of Work: was working at [employer]   Education:  Some College   Homebound arranged:  n/a  Financial Resources:  Medicaid   Other Resources:  Food Stamps , WIC   Cultural/Religious Considerations Which May Impact Care:  none   Strengths:  Ability to meet basic needs , Compliance with medical plan , Home prepared for child , Pediatrician chosen   Psychotropic Medications:       None at this time per MOB.   Pediatrician:    [place] area  Pediatrician List:   [place]  [place]    [place]    [place]    [place]    [place]      Pediatrician Fax Number:    Risk Factors/Current Problems:  Substance Use , None   Cognitive State:  Insightful , Able to Concentrate , Alert    Mood/Affect:  Relaxed , Comfortable , Calm , Interested , Happy    CSW  Assessment: CSW consulted as MOB reported TCH use in pregnancy as well as hx of mental health. CSW went to speak with MOB at bedside to address further needs.   CSW congratulated MOB on the birth of infant. CSW advised MOB of the HIPPA policy in which MOB reported that it was for her mom to remain in the room while CSW spoke with her. CSW advised MOB of CSW's role and the reason for CSW coming to speak with her. MOB reported that she was diagnosed with depression, anxiety and Bipolar all in 2016. MOB reports that she was given medications but report that she stopped taking the medication due to the way that it made her feel. MOB reported that she then started therapy in which MOB reported she liked. MOB reports that she was seen at the [facility] in the past for her therapy. MOB reported that she is interested in being seen with a therapist and requested "Beth". CSW was informed that Beth no longer works with the [facility], therefore offered MOB other resources in which MOB was agreeable to. MOB reported no recent signs or symptoms regarding mental health. MOB reported that she   isn't feeling SI or HI and denies DV.   CSW inquired from MOB on her substance use during this pregnancy. 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 MOB reported to CSW in the past that she used to help with her sickness. CSW advised MOB of the hospital drug screen policy again while here. MOB reported that she thought that since she was negative that they wouldn't drug screen infant. CSW advised MOB that in some cases they screen mom and baby as results can vary in results from mom and baby. CSW was advised that cotton calls where placed in infant diaper in the past however, MOB reports that "they threw them away". CSW was advised by RN that no further cotton balls would be placed in infants pamper due to age of infant. CSW advised MOB that if infants CDS is positive  for any substances that MOB wasn't given or  prescribed by  MD then CSW would needed to make a CPS report. MOB reported that she understood and denies having any CPS involvement. MOB reported that she has other children whom do not live with her. MOB reported that her oldest son Jaydenn Grave (02/11/2010) lives with his father in  [place], [place] and Jachai Davis age 5 11/20/2014 lives with his dad Everett Davis. MOB reports that these placements were not dne via CPS as MOB reported that she allowed them to have them. CSW understood. MOB reported that she has all needed items to care for child with no other resources desired at this time.   CSW took time to provide MOB with PPD and SIDS education. MOB was  given PPD Checklist as well as other resources to follow up with in regards to PPD. MOB thanked CSW and reported no other needs  CSW notified by CPS that placements of other children were not done via CPS. CSW will continue to monitor infants CDS and make CPS report if warranted. At this time there are no barriers to d/c.   CSW Plan/Description:  No Further Intervention Required/No Barriers to Discharge, Sudden Infant Death Syndrome (SIDS) Education, Perinatal Mood and Anxiety Disorder (PMADs) Education, Hospital Drug Screen Policy Information, CSW Will Continue to Monitor Umbilical Cord Tissue Drug Screen Results and Make Report if Warranted    Reubin Bushnell S Falyn Rubel, LCSWA 03/23/2020, 10:55 AM 

## 2020-03-23 NOTE — Plan of Care (Signed)
Able to meet needs

## 2020-03-23 NOTE — Progress Notes (Signed)
Subjective: Postpartum Day 1: Vaginal delivery, 1 laceration Patient up ad lib, reports no syncope or dizziness. Feeding:  Breast Contraceptive plan:  Unsure  Objective: Vital signs in last 24 hours: Temp:  [97.8 F (36.6 C)-98.4 F (36.9 C)] 98.2 F (36.8 C) (08/04 0530) Pulse Rate:  [65-73] 73 (08/04 0530) Resp:  [16-18] 16 (08/04 0530) BP: (105-130)/(49-91) 106/52 (08/04 0530) SpO2:  [99 %-100 %] 99 % (08/04 0100)  Physical Exam:  General: alert, cooperative and no distress Lochia: appropriate Uterine Fundus: firm Perineum: healing well DVT Evaluation: No evidence of DVT seen on physical exam.   CBC Latest Ref Rng & Units 03/23/2020 03/21/2020 03/14/2020  WBC 4.0 - 10.5 K/uL 8.3 6.4 6.4  Hemoglobin 12.0 - 15.0 g/dL 4.9(S) 4.9(Q) 7.5(F)  Hematocrit 36 - 46 % 30.6(L) 30.1(L) 27.2(L)  Platelets 150 - 400 K/uL 366 369 331     Assessment/Plan: Status post vaginal delivery day 1. Stable Continue current care. Plan for discharge tomorrow and Breastfeeding    Henderson Newcomer ProtheroCNM 03/23/2020, 10:39 AM

## 2020-03-24 MED ORDER — BENZOCAINE-MENTHOL 20-0.5 % EX AERO: 1.0000 "application " | INHALATION_SPRAY | CUTANEOUS | Status: DC | PRN

## 2020-03-24 MED ORDER — ACETAMINOPHEN 500 MG PO TABS
1000.0000 mg | ORAL_TABLET | Freq: Four times a day (QID) | ORAL | 2 refills | Status: AC | PRN
Start: 2020-03-24 — End: 2021-03-24

## 2020-03-24 MED ORDER — IBUPROFEN 600 MG PO TABS: 600.0000 mg | ORAL_TABLET | Freq: Four times a day (QID) | ORAL | 0 refills | Status: DC

## 2020-03-24 MED ORDER — COCONUT OIL OIL: 1.0000 "application " | TOPICAL_OIL | 0 refills | Status: DC | PRN

## 2020-03-24 NOTE — Progress Notes (Addendum)
0220: Pt reported new onset, sharp, 10/10 back pain at previous epidural site. Notified CRNA who came to bedside to assess site. Per CRNA, epidural site appears WDL. No redness/swelling/draining at site. Pt has been OOB ambulating.  Called Dorisann Frames, CNM to report symptoms. Per CNM, pt cannot have any other pain medications at this time. Provider will reassess in the morning. Will continue to monitor.

## 2020-03-24 NOTE — Lactation Note (Signed)
This note was copied from a baby's chart. Lactation Consultation Note  Patient Name: Melanie Hancock [id number] Date: 03/24/2020 Reason for consult: Follow-up assessment  P4 mother whose infant is now 7 hours old.  This is an ETI at 37+1 weeks.    Baby was asleep at mother's side when I arrived.  Mother had no questions/concerns related to breast feeding.  She did have a question about using a sling for baby.  She has a sling but does not know how to tie it; stated that her baby never wants to be put down.  Mother stated she did not receive much sleep last night because her baby wanted to be near her all night long.  I, personally, do not know how to use a sling and suggested she may have to go online to learn proper swaddles.  However, I did tell her I would check with the staff members working today and see if anyone may be able to assist her.  Mother appreciative.  She will continue to feed on cue or at least 8-12 times/24 hours.  She is familiar with engorgement and has experienced this in the past.  Mother has a DEBP for home use.  Briefly reviewed how to treat engorgement if this should occur again.  Support person present.   Maternal Data    Feeding Feeding Type: Breast Fed  LATCH Score Latch: Grasps breast easily, tongue down, lips flanged, rhythmical sucking.  Audible Swallowing: Spontaneous and intermittent  Type of Nipple: Everted at rest and after stimulation  Comfort (Breast/Nipple): Soft / non-tender  Hold (Positioning): No assistance needed to correctly position infant at breast.  LATCH Score: 10  Interventions    Lactation Tools Discussed/Used     Consult Status Consult Status: Complete    Verlean Allport R Lamount Bankson 03/24/2020, 9:44 AM

## 2020-03-24 NOTE — Progress Notes (Signed)
Called to bedside by RN.  Upon arrival to room, pt sitting up in bed, breastfeeding baby.  Pt states she is having soreness at the epidural site that is greater on the right than the left. During the day patient endores she was up walking around throughout the day was about to go down to [employer].  Pt denies any numbness or weakness. There is no swelling, drainage, or redness at epidural site.  Pt reassured that it is common to have soreness after epidural/delivery. RN encouraged to obtain additional pain medication orders from primary team if needed. RN and patient encouraged to reach back out to anesthesia if condition persists or worsens. Houser MD aware of findings and agrees with plan of care.

## 2020-03-24 NOTE — Discharge Summary (Signed)
OB Discharge Summary  Patient Name: Melanie Hancock DOB: [birth year] MRN: [id number]  Date of admission: 03/21/2020 Delivering provider: CRUMPLER, JENNIFER B   Admitting diagnosis: Pregnancy [Z34.90] Intrauterine pregnancy: [redacted]w[redacted]d     Secondary diagnosis: Patient Active Problem List   Diagnosis Date Noted  . Postpartum care following vaginal delivery 8/4 03/24/2020  . Acute intractable headache 03/14/2020  . Vaginal delivery 02/04/2016  . History of postpartum depression, currently pregnant 01/23/2016  . Iron deficiency anemia 01/02/2016  . 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 Trichomoniasis 06/17/2015  . Anxiety 03/28/2014  . Chronic back pain 03/28/2014  . Hx pyelonephritis 2014 05/26/2013   Additional problems:Infant followed by Child psychotherapist for maternal history of TCH use.   Date of discharge: 03/24/2020   Discharge diagnosis: Principal Problem:   Postpartum care following vaginal delivery 8/4 Active Problems:   Vaginal delivery   Acute intractable headache                                                              Post partum procedures:none  Augmentation: Pitocin and Cytotec Pain control: Epidural  Laceration:None  Episiotomy:None  Complications: None  Hospital course:  Induction of Labor With Vaginal Delivery   24 y.o. yo [id number] at [redacted]w[redacted]d was admitted to the hospital 03/21/2020 for induction of labor.  Indication for induction: intractable headaches in pregnancy.  Patient had an uncomplicated labor course as follows: Membrane Rupture Time/Date: 9:50 AM ,03/22/2020   Delivery Method:Vaginal, Spontaneous  Episiotomy: None  Lacerations:  None  Details of delivery can be found in separate delivery note.  Patient had a routine postpartum course. Patient is discharged home 03/24/20.  Newborn Data: Birth date:03/22/2020  Birth time:10:18 AM  Gender:Female  Living status:Living  Apgars:8 ,9  Weight:2820 g   Physical exam  Vitals:   03/23/20 0530  03/23/20 1440 03/23/20 2201 03/24/20 0520  BP: (!) 106/52 118/68 118/72 109/70  Pulse: 73 73 66 82  Resp: 16 16 16 16   Temp: 98.2 F (36.8 C) 98.2 F (36.8 C) 98.9 F (37.2 C) 98 F (36.7 C)  TempSrc: Oral Oral Oral Oral  SpO2:  99% 100% 100%  Weight:      Height:       General: alert, cooperative and no distress Lochia: appropriate Uterine Fundus: firm Incision: N/A Perineum: intact, no edema DVT Evaluation: No cords or calf tenderness. No significant calf/ankle edema. Labs: Lab Results  Component Value Date   WBC 8.3 03/23/2020   HGB 9.7 (L) 03/23/2020   HCT 30.6 (L) 03/23/2020   MCV 83.8 03/23/2020   PLT 366 03/23/2020   CMP Latest Ref Rng & Units 03/21/2020  Glucose 70 - 99 mg/dL 77  BUN 6 - 20 mg/dL 6  Creatinine 05/21/2020 - 5.18 mg/dL 8.41  Sodium 6.60 - 630 mmol/L 135  Potassium 3.5 - 5.1 mmol/L 3.6  Chloride 98 - 111 mmol/L 104  CO2 22 - 32 mmol/L 22  Calcium 8.9 - 10.3 mg/dL 160)  Total Protein 6.5 - 8.1 g/dL 6.3(L)  Total Bilirubin 0.3 - 1.2 mg/dL 0.7  Alkaline Phos 38 - 126 U/L 145(H)  AST 15 - 41 U/L 26  ALT 0 - 44 U/L 24   Edinburgh Postnatal Depression Scale Screening Tool 03/23/2020  I have  been able to laugh and see the funny side of things. 0  I have looked forward with enjoyment to things. 0  I have blamed myself unnecessarily when things went wrong. 1  I have been anxious or worried for no good reason. 0  I have felt scared or panicky for no good reason. 1  Things have been getting on top of me. 1  I have been so unhappy that I have had difficulty sleeping. 0  I have felt sad or miserable. 0  I have been so unhappy that I have been crying. 0  The thought of harming myself has occurred to me. 0  Edinburgh Postnatal Depression Scale Total 3   Vaccines: TDaP UTD         Flu    declined  Discharge instruction:  per After Visit Summary,  "Understanding Mother & Baby Care" hospital booklet  After Visit Meds:  Allergies as of 03/24/2020   No Known  Allergies     Medication List    STOP taking these medications   acetaminophen 650 MG CR tablet Commonly known as: Tylenol 8 Hour Replaced by: acetaminophen 500 MG tablet   ferrous sulfate 325 (65 FE) MG tablet Commonly known as: FerrouSul   metoCLOPramide 10 MG tablet Commonly known as: REGLAN   ondansetron 4 MG tablet Commonly known as: ZOFRAN     TAKE these medications   acetaminophen 500 MG tablet Commonly known as: TYLENOL Take 2 tablets (1,000 mg total) by mouth every 6 (six) hours as needed. Replaces: acetaminophen 650 MG CR tablet   benzocaine-Menthol 20-0.5 % Aero Commonly known as: DERMOPLAST Apply 1 application topically as needed for irritation (perineal discomfort).   coconut oil Oil Apply 1 application topically as needed.   ibuprofen 600 MG tablet Commonly known as: ADVIL Take 1 tablet (600 mg total) by mouth every 6 (six) hours.   prenatal multivitamin Tabs tablet Take 1 tablet by mouth daily at 12 noon.   Proctofoam HC rectal foam Generic drug: hydrocortisone-pramoxine Place 1 applicator rectally 2 (two) times daily.   valACYclovir 1000 MG tablet Commonly known as: VALTREX Take 1 tablet (1,000 mg total) by mouth daily for 10 days.            Discharge Care Instructions  (From admission, onward)         Start     Ordered   03/24/20 0000  Discharge wound care:       Comments: Sitz baths 2 times /day with warm water x 1 week. May add herbals: 1 ounce dried comfrey leaf* 1 ounce calendula flowers 1 ounce lavender flowers  Supplies can be found online at [employer] sources at [employer], [employer]  1/2 ounce dried uva ursi leaves 1/2 ounce witch hazel blossoms (if you can find them) 1/2 ounce dried sage leaf 1/2 cup sea salt Directions: Bring 2 quarts of water to a boil. Turn off heat, and place 1 ounce (approximately 1 large handful) of the above mixed herbs (not the salt) into the pot. Steep, covered, for 30  minutes.  Strain the liquid well with a fine mesh strainer, and discard the herb material. Add 2 quarts of liquid to the tub, along with the 1/2 cup of salt. This medicinal liquid can also be made into compresses and peri-rinses.   03/24/20 1156          Diet: routine diet  Activity: Advance as tolerated. Pelvic rest for 6 weeks.   Postpartum  contraception: Depo Provera  Newborn Data: Live born female  Birth Weight: 6 lb 3.5 oz (2820 g) APGAR: 8, 9  Newborn Delivery   Birth date/time: 03/22/2020 10:18:00 Delivery type: Vaginal, Spontaneous      named Nani Skillern Baby Feeding: Breast Disposition:home with mother   Delivery Report:   Review the Delivery Report for details.    Follow up:  Follow-up Information    [facility]. Go in 6 week(s).   Specialty: Obstetrics and Gynecology Why: Postpartum visit Contact information: [address] [place] [address] [phone]                Signed: Cipriano Mile, MSN 03/24/2020, 11:57 AM

## 2020-03-25 ENCOUNTER — Encounter (HOSPITAL_COMMUNITY): Payer: Self-pay

## 2020-03-25 ENCOUNTER — Ambulatory Visit (HOSPITAL_COMMUNITY)
Admission: RE | Admit: 2020-03-25 | Discharge: 2020-03-25 | Disposition: A | Discharge status: Home / Self Care | Payer: Medicaid Other | Source: Ambulatory Visit | Attending: Family Medicine | Admitting: Family Medicine

## 2020-03-25 ENCOUNTER — Other Ambulatory Visit: Payer: Self-pay

## 2020-03-25 VITALS — BP 124/81 | HR 74 | Temp 98.2°F | Resp 15

## 2020-03-25 DIAGNOSIS — T7840XA Allergy, unspecified, initial encounter: Secondary | ICD-10-CM

## 2020-03-25 MED ORDER — CETIRIZINE HCL 10 MG PO TABS
10.0000 mg | ORAL_TABLET | Freq: Every day | ORAL | 0 refills | Status: DC
Start: 2020-03-25 — End: 2020-11-04

## 2020-03-25 NOTE — ED Provider Notes (Signed)
[facility]    CSN: [id number] Arrival date & time: 03/25/20  1909      History   Chief Complaint Chief Complaint  Patient presents with  . Appointment  . Allergic Reaction    HPI Melanie Hancock is a 24 y.o. female.   HPI  Patient just had a baby a couple of days ago.  She is here with her infant.  She is breast-feeding.  She states that the time of her delivery she was given a medication that caused her to have itching all over.  They gave her Benadryl in the hospital.  She is afraid to take Benadryl at home.  She states that she has some swelling and soreness in her tongue.  A feeling of swelling in the back of her throat.  Minor itching of her skin.  No rash.  No shortness of breath or wheezing  Past Medical History:  Diagnosis Date  . Anemia   . Anxiety   . Blood transfusion without reported diagnosis   . Chronic back pain   . Depression   . GERD (gastroesophageal reflux disease)   . Headache   . Infection    UTI  . Post partum depression   . Pyelonephritis   . UTI (lower urinary tract infection)     Patient Active Problem List   Diagnosis Date Noted  . Postpartum care following vaginal delivery 8/4 03/24/2020  . Acute intractable headache 03/14/2020  . Vaginal delivery 02/04/2016  . History of postpartum depression, currently pregnant 01/23/2016  . Iron deficiency anemia 01/02/2016  . 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 Trichomoniasis 06/17/2015  . Anxiety 03/28/2014  . Chronic back pain 03/28/2014  . Hx pyelonephritis 2014 05/26/2013    Past Surgical History:  Procedure Laterality Date  . DILATION AND CURETTAGE OF UTERUS    . INDUCED ABORTION    . WISDOM TOOTH EXTRACTION      OB History    Gravida  5   Para  4   Term  4   Preterm      AB  1   Living  4     SAB  0   TAB  1   Ectopic      Multiple  0   Live Births  4            Home Medications    Prior to Admission medications     Medication Sig Start Date End Date Taking? Authorizing Provider  acetaminophen (TYLENOL) 500 MG tablet Take 2 tablets (1,000 mg total) by mouth every 6 (six) hours as needed. 03/24/20 03/24/21 Yes Arlan Organ C, CNM  benzocaine-Menthol (DERMOPLAST) 20-0.5 % AERO Apply 1 application topically as needed for irritation (perineal discomfort). 03/24/20  Yes Arlan Organ C, CNM  coconut oil OIL Apply 1 application topically as needed. 03/24/20  Yes Neta Mends, CNM  ibuprofen (ADVIL) 600 MG tablet Take 1 tablet (600 mg total) by mouth every 6 (six) hours. 03/24/20  Yes Neta Mends, CNM  Prenatal Vit-Fe Fumarate-FA (PRENATAL MULTIVITAMIN) TABS tablet Take 1 tablet by mouth daily at 12 noon.   Yes [provider]  PROCTOFOAM [facility] rectal foam Place 1 applicator rectally 2 (two) times daily. 03/17/20  Yes [provider]  valACYclovir (VALTREX) 1000 MG tablet Take 1 tablet (1,000 mg total) by mouth daily for 10 days. 03/16/20 03/26/20 Yes Roma Schanz, CNM  cetirizine (ZYRTEC) 10 MG tablet Take 1 tablet (  10 mg total) by mouth daily. 03/25/20   Eustace Moore, MD  pantoprazole (PROTONIX) 20 MG tablet Take 1 tablet (20 mg total) by mouth daily. 09/17/19 11/12/19  Aviva Signs, CNM    Family History Family History  Problem Relation Age of Onset  . Arthritis Mother   . Healthy Father   . Arthritis Maternal Grandmother   . Asthma Son   . Alcohol abuse Neg Hx     Social History Social History   Tobacco Use  . Smoking status: Former Smoker    Quit date: 01/21/2014    Years since quitting: 6.1  . Smokeless tobacco: Never Used  Vaping Use  . Vaping Use: Former  . Substances: Nicotine, Flavoring  Substance Use Topics  . Alcohol use: Not Currently    Comment: rarely  . Drug use: Not on file    Comment: 2-3 weeks ago     Allergies   Patient has no known allergies.   Review of Systems Review of Systems See HPI  Physical Exam Triage Vital Signs ED Triage Vitals  Enc  Vitals Group     BP 03/25/20 1947 124/81     Pulse Rate 03/25/20 1947 74     Resp 03/25/20 1947 15     Temp 03/25/20 1947 98.2 F (36.8 C)     Temp Source 03/25/20 1947 Oral     SpO2 03/25/20 1947 100 %     Weight --      Height --      Head Circumference --      Peak Flow --      Pain Score 03/25/20 1923 0     Pain Loc --      Pain Edu? --      Excl. in GC? --    No data found.  Updated Vital Signs BP 124/81 (BP Location: Left Arm)   Pulse 74   Temp 98.2 F (36.8 C) (Oral)   Resp 15   LMP 07/06/2019   SpO2 100%     Physical Exam Constitutional:      General: She is not in acute distress.    Appearance: She is well-developed and normal weight.  HENT:     Head: Normocephalic and atraumatic.     Mouth/Throat:     Comments: Mask is in place uvula has mild soft tissue swelling.  Tongue has slight geographic appearance.  No oral lesion Eyes:     Conjunctiva/sclera: Conjunctivae normal.     Pupils: Pupils are equal, round, and reactive to light.  Cardiovascular:     Rate and Rhythm: Normal rate.  Pulmonary:     Effort: Pulmonary effort is normal. No respiratory distress.     Comments: Lungs are clear throughout Abdominal:     General: There is no distension.     Palpations: Abdomen is soft.  Musculoskeletal:        General: Normal range of motion.     Cervical back: Normal range of motion.  Skin:    General: Skin is warm and dry.     Comments: Skin has no rash  Neurological:     Mental Status: She is alert.  Psychiatric:        Mood and Affect: Mood normal.        Behavior: Behavior normal.      UC Treatments / Results  Labs (all labs ordered are listed, but only abnormal results are displayed) Labs Reviewed - No data to display  EKG  Radiology No results found.  Procedures Procedures (including critical care time)  Medications Ordered in UC Medications - No data to display  Initial Impression / Assessment and Plan / UC Course  I have  reviewed the triage vital signs and the nursing notes.  Pertinent labs & imaging results that were available during my care of the patient were reviewed by me and considered in my medical decision making (see chart for details).     Allergic reaction to unknown medication.  We will treat with Zyrtec at this point. Final Clinical Impressions(s) / UC Diagnoses   Final diagnoses:  Allergic reaction, initial encounter     Discharge Instructions     Zyrtec is safe while breast-feeding Take 1 a day This should help improve the itching, and swelling in your tongue and mouth Call or return for problems I sent a new prescription to your pharmacy   ED Prescriptions    Medication Sig Dispense Auth. Provider   cetirizine (ZYRTEC) 10 MG tablet Take 1 tablet (10 mg total) by mouth daily. 30 tablet Eustace Moore, MD     PDMP not reviewed this encounter.   Eustace Moore, MD 03/25/20 2013

## 2020-03-25 NOTE — Discharge Instructions (Addendum)
Zyrtec is safe while breast-feeding Take 1 a day This should help improve the itching, and swelling in your tongue and mouth Call or return for problems I sent a new prescription to your pharmacy

## 2020-03-25 NOTE — ED Triage Notes (Signed)
Pt c/o allergic reaction onset Tuesday after giving birth. She states she is unsure of the medication that they gave her but her mouth is swollen and sore and she has red patches in her mouth. Pt states her throat feels swollen and sore.

## 2020-03-28 DIAGNOSIS — K219 Gastro-esophageal reflux disease without esophagitis: Secondary | ICD-10-CM | POA: Insufficient documentation

## 2020-04-20 ENCOUNTER — Ambulatory Visit
Admission: RE | Admit: 2020-04-20 | Discharge: 2020-04-20 | Disposition: A | Discharge status: Home / Self Care | Payer: Medicaid Other | Source: Ambulatory Visit | Attending: Emergency Medicine | Admitting: Emergency Medicine

## 2020-04-20 ENCOUNTER — Ambulatory Visit: Payer: Self-pay

## 2020-04-20 ENCOUNTER — Other Ambulatory Visit: Payer: Self-pay

## 2020-04-20 VITALS — BP 113/73 | HR 65 | Temp 98.4°F | Resp 16

## 2020-04-20 DIAGNOSIS — Z1152 Encounter for screening for COVID-19: Secondary | ICD-10-CM

## 2020-04-20 NOTE — ED Triage Notes (Signed)
Pt states her child had a positive exposure at daycare and she is breastfeeding her 30 month old. Denies sx's

## 2020-04-20 NOTE — Discharge Instructions (Signed)

## 2020-04-23 LAB — NOVEL CORONAVIRUS, NAA: SARS-CoV-2, NAA: NOT DETECTED

## 2020-07-16 ENCOUNTER — Ambulatory Visit (HOSPITAL_COMMUNITY): Payer: Self-pay

## 2020-07-17 ENCOUNTER — Other Ambulatory Visit: Payer: Self-pay

## 2020-07-17 ENCOUNTER — Ambulatory Visit
Admission: RE | Admit: 2020-07-17 | Discharge: 2020-07-17 | Disposition: A | Discharge status: Home / Self Care | Payer: Medicaid Other | Source: Ambulatory Visit | Attending: Emergency Medicine | Admitting: Emergency Medicine

## 2020-07-17 VITALS — BP 106/55 | HR 71 | Temp 98.2°F | Resp 18

## 2020-07-17 DIAGNOSIS — R5383 Other fatigue: Secondary | ICD-10-CM

## 2020-07-17 DIAGNOSIS — R110 Nausea: Secondary | ICD-10-CM

## 2020-07-17 LAB — POCT URINE PREGNANCY: Preg Test, Ur: NEGATIVE

## 2020-07-17 NOTE — ED Provider Notes (Signed)
[facility]    CSN: [id number] Arrival date & time: 07/17/20  1106      History   Chief Complaint Chief Complaint  Patient presents with  . Appointment    1100  . Nausea    HPI Melanie Hancock is a 24 y.o. female  Presenting for 3-day course of fatigue and nausea.  States her boyfriend had similar symptoms last week which have since resolved.  No other known sick contacts.  Patient requesting pregnancy test: Currently breast-feeding her 16-month-old.  LMP unknown.  Denying change in urinary or bowel habit, abdominal pain, fever.  No vomiting, cough, nasal congestion or rhinorrhea.  Patient does not currently supplement her anemia with iron as it causes upset stomach.  Denies symptoms of her "anemia acting up": Cold intolerance.  No recent injury, bleeding.  Past Medical History:  Diagnosis Date  . Anemia   . Anxiety   . Blood transfusion without reported diagnosis   . Chronic back pain   . Depression   . GERD (gastroesophageal reflux disease)   . Headache   . Infection    UTI  . Post partum depression   . Pyelonephritis   . UTI (lower urinary tract infection)     Patient Active Problem List   Diagnosis Date Noted  . Postpartum care following vaginal delivery 8/4 03/24/2020  . Acute intractable headache 03/14/2020  . Vaginal delivery 02/04/2016  . History of postpartum depression, currently pregnant 01/23/2016  . Iron deficiency anemia 01/02/2016  . 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 Trichomoniasis 06/17/2015  . Anxiety 03/28/2014  . Chronic back pain 03/28/2014  . Hx pyelonephritis 2014 05/26/2013    Past Surgical History:  Procedure Laterality Date  . DILATION AND CURETTAGE OF UTERUS    . INDUCED ABORTION    . WISDOM TOOTH EXTRACTION      OB History    Gravida  5   Para  4   Term  4   Preterm      AB  1   Living  4     SAB  0   TAB  1   Ectopic      Multiple  0   Live Births  4             Home Medications    Prior to Admission medications   Medication Sig Start Date End Date Taking? Authorizing Provider  acetaminophen (TYLENOL) 500 MG tablet Take 2 tablets (1,000 mg total) by mouth every 6 (six) hours as needed. 03/24/20 03/24/21  Neta Mends, CNM  benzocaine-Menthol (DERMOPLAST) 20-0.5 % AERO Apply 1 application topically as needed for irritation (perineal discomfort). 03/24/20   Neta Mends, CNM  cetirizine (ZYRTEC) 10 MG tablet Take 1 tablet (10 mg total) by mouth daily. 03/25/20   Eustace Moore, MD  coconut oil OIL Apply 1 application topically as needed. 03/24/20   Neta Mends, CNM  ibuprofen (ADVIL) 600 MG tablet Take 1 tablet (600 mg total) by mouth every 6 (six) hours. 03/24/20   Neta Mends, CNM  Prenatal Vit-Fe Fumarate-FA (PRENATAL MULTIVITAMIN) TABS tablet Take 1 tablet by mouth daily at 12 noon.    [provider]  PROCTOFOAM [facility] rectal foam Place 1 applicator rectally 2 (two) times daily. Patient not taking: Reported on 07/17/2020 03/17/20   [provider]  pantoprazole (PROTONIX) 20 MG tablet Take 1 tablet (20 mg total) by mouth daily. 09/17/19 11/12/19  Mayford Knife,  Elby Showers, CNM    Family History Family History  Problem Relation Age of Onset  . Arthritis Mother   . Healthy Father   . Arthritis Maternal Grandmother   . Asthma Son   . Alcohol abuse Neg Hx     Social History Social History   Tobacco Use  . Smoking status: Former Smoker    Quit date: 01/21/2014    Years since quitting: 6.4  . Smokeless tobacco: Never Used  Vaping Use  . Vaping Use: Former  . Substances: Nicotine, Flavoring  Substance Use Topics  . Alcohol use: Not Currently    Comment: rarely  . Drug use: Not on file    Comment: 2-3 weeks ago     Allergies   Patient has no known allergies.   Review of Systems Review of Systems  Constitutional: Negative for fatigue and fever.  HENT: Negative for ear pain, sinus pain, sore throat and voice change.    Eyes: Negative for pain, redness and visual disturbance.  Respiratory: Negative for cough and shortness of breath.   Cardiovascular: Negative for chest pain and palpitations.  Gastrointestinal: Positive for nausea. Negative for abdominal distention, abdominal pain, blood in stool, constipation, diarrhea and vomiting.  Endocrine: Negative for cold intolerance, polydipsia, polyphagia and polyuria.  Genitourinary: Negative for dysuria, flank pain, frequency, hematuria, pelvic pain, urgency, vaginal bleeding, vaginal discharge and vaginal pain.  Musculoskeletal: Negative for arthralgias and myalgias.  Skin: Negative for rash and wound.  Neurological: Negative for syncope and headaches.     Physical Exam Triage Vital Signs ED Triage Vitals [07/17/20 1118]  Enc Vitals Group     BP (!) 106/55     Pulse Rate 71     Resp 18     Temp 98.2 F (36.8 C)     Temp Source Oral     SpO2 97 %     Weight      Height      Head Circumference      Peak Flow      Pain Score 0     Pain Loc      Pain Edu?      Excl. in GC?    No data found.  Updated Vital Signs BP (!) 106/55 (BP Location: Left Arm)   Pulse 71   Temp 98.2 F (36.8 C) (Oral)   Resp 18   SpO2 97%   Visual Acuity Right Eye Distance:   Left Eye Distance:   Bilateral Distance:    Right Eye Near:   Left Eye Near:    Bilateral Near:     Physical Exam Constitutional:      General: She is not in acute distress. HENT:     Head: Normocephalic and atraumatic.  Eyes:     General: No scleral icterus.    Pupils: Pupils are equal, round, and reactive to light.  Cardiovascular:     Rate and Rhythm: Normal rate.  Pulmonary:     Effort: Pulmonary effort is normal.  Abdominal:     General: Bowel sounds are normal.     Palpations: Abdomen is soft.     Tenderness: There is no abdominal tenderness. There is no right CVA tenderness, left CVA tenderness or guarding.  Skin:    Coloration: Skin is not jaundiced or pale.   Neurological:     Mental Status: She is alert and oriented to person, place, and time.      UC Treatments / Results  Labs (all labs ordered  are listed, but only abnormal results are displayed) Labs Reviewed  NOVEL CORONAVIRUS, NAA  POCT URINE PREGNANCY    EKG   Radiology No results found.  Procedures Procedures (including critical care time)  Medications Ordered in UC Medications - No data to display  Initial Impression / Assessment and Plan / UC Course  I have reviewed the triage vital signs and the nursing notes.  Pertinent labs & imaging results that were available during my care of the patient were reviewed by me and considered in my medical decision making (see chart for details).     Urine pregnancy negative, exam unremarkable.  Covid test pending at patient's request.  Work note provided at patient's request.  Offered screening for anemia: Patient declined.  Encourage patient to supplement as prescribed: Try every other day, follow-up with PCP for further evaluation and management of her anemia.  Return precautions discussed, pt verbalized understanding and is agreeable to plan. Final Clinical Impressions(s) / UC Diagnoses   Final diagnoses:  Nausea without vomiting  Fatigue, unspecified type     Discharge Instructions     Your COVID test is pending - it is important to quarantine / isolate at home until your results are back. If you test positive and would like further evaluation for persistent or worsening symptoms, you may schedule an E-visit or virtual (video) visit throughout the [facility] app or website.  PLEASE NOTE: If you develop severe chest pain or shortness of breath please go to the ER or call 9-1-1 for further evaluation --> DO NOT schedule electronic or virtual visits for this. Please call our office for further guidance / recommendations as needed.  For information about the Covid vaccine, please visit  [email or web address]    ED Prescriptions    None     PDMP not reviewed this encounter.   [place], [place], [place] 07/17/20 1221

## 2020-07-17 NOTE — Discharge Instructions (Signed)
Your COVID test is pending - it is important to quarantine / isolate at home until your results are back. °If you test positive and would like further evaluation for persistent or worsening symptoms, you may schedule an E-visit or virtual (video) visit throughout the [facility] MyChart app or website. ° °PLEASE NOTE: If you develop severe chest pain or shortness of breath please go to the ER or call 9-1-1 for further evaluation --> DO NOT schedule electronic or virtual visits for this. °Please call our office for further guidance / recommendations as needed. ° °For information about the Covid vaccine, please visit [email or web address] °

## 2020-07-17 NOTE — ED Triage Notes (Signed)
Pt sts nausea and fatigue x 3 days; pt unsure of pregnancy status but just had baby 4 months ago

## 2020-07-18 LAB — NOVEL CORONAVIRUS, NAA: SARS-CoV-2, NAA: NOT DETECTED

## 2020-07-18 LAB — SARS-COV-2, NAA 2 DAY TAT

## 2020-08-29 ENCOUNTER — Ambulatory Visit
Admission: EM | Admit: 2020-08-29 | Discharge: 2020-08-29 | Disposition: A | Discharge status: Home / Self Care | Payer: Medicaid Other | Attending: Family Medicine | Admitting: Family Medicine

## 2020-08-29 DIAGNOSIS — J029 Acute pharyngitis, unspecified: Secondary | ICD-10-CM

## 2020-08-29 NOTE — ED Provider Notes (Signed)
Melanie Hancock    CSN: [id number] Arrival date & time: 08/29/20  1217      History   Chief Complaint Chief Complaint  Patient presents with  . Sore Throat    HPI Melanie Hancock is a 25 y.o. female.   Patient is a 25 year old female who presents today with sore throat upon waking this morning.  Mild pain with swallowing but no trouble swallowing.  She is also had some mild upper abdominal discomfort, increased belching and dry heaving.  History of acid reflux.  He is not currently take any medicine for this.  Denies any nausea, vomiting, diarrhea, fever, cough.  Possible Covid exposure at work.  Has not taken any medicines for symptoms.     Past Medical History:  Diagnosis Date  . Anemia   . Anxiety   . Blood transfusion without reported diagnosis   . Chronic back pain   . Depression   . GERD (gastroesophageal reflux disease)   . Headache   . Infection    UTI  . Post partum depression   . Pyelonephritis   . UTI (lower urinary tract infection)     Patient Active Problem List   Diagnosis Date Noted  . Postpartum care following vaginal delivery 8/4 03/24/2020  . Acute intractable headache 03/14/2020  . Vaginal delivery 02/04/2016  . History of postpartum depression, currently pregnant 01/23/2016  . Iron deficiency anemia 01/02/2016  . 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 Trichomoniasis 06/17/2015  . Anxiety 03/28/2014  . Chronic back pain 03/28/2014  . Hx pyelonephritis 2014 05/26/2013    Past Surgical History:  Procedure Laterality Date  . DILATION AND CURETTAGE OF UTERUS    . INDUCED ABORTION    . WISDOM TOOTH EXTRACTION      OB History    Gravida  5   Para  4   Term  4   Preterm      AB  1   Living  4     SAB  0   IAB  1   Ectopic      Multiple  0   Live Births  4            Home Medications    Prior to Admission medications   Medication Sig Start Date End Date Taking? Authorizing Provider   Prenatal Vit-Fe Fumarate-FA (PRENATAL MULTIVITAMIN) TABS tablet Take 1 tablet by mouth daily at 12 noon.   Yes [provider]  acetaminophen (TYLENOL) 500 MG tablet Take 2 tablets (1,000 mg total) by mouth every 6 (six) hours as needed. 03/24/20 03/24/21  Neta Mends, CNM  benzocaine-Menthol (DERMOPLAST) 20-0.5 % AERO Apply 1 application topically as needed for irritation (perineal discomfort). 03/24/20   Neta Mends, CNM  cetirizine (ZYRTEC) 10 MG tablet Take 1 tablet (10 mg total) by mouth daily. 03/25/20   Eustace Moore, MD  coconut oil OIL Apply 1 application topically as needed. 03/24/20   Neta Mends, CNM  ibuprofen (ADVIL) 600 MG tablet Take 1 tablet (600 mg total) by mouth every 6 (six) hours. 03/24/20   Neta Mends, CNM  PROCTOFOAM [facility] rectal foam Place 1 applicator rectally 2 (two) times daily. Patient not taking: Reported on 07/17/2020 03/17/20   [provider]  pantoprazole (PROTONIX) 20 MG tablet Take 1 tablet (20 mg total) by mouth daily. 09/17/19 11/12/19  Aviva Signs, CNM    Family History Family History  Problem Relation Age  of Onset  . Arthritis Mother   . Healthy Father   . Arthritis Maternal Grandmother   . Asthma Son   . Alcohol abuse Neg Hx     Social History Social History   Tobacco Use  . Smoking status: Former Smoker    Quit date: 01/21/2014    Years since quitting: 6.6  . Smokeless tobacco: Never Used  Vaping Use  . Vaping Use: Former  . Substances: Nicotine, Flavoring  Substance Use Topics  . Alcohol use: Not Currently    Comment: rarely     Allergies   Patient has no known allergies.   Review of Systems Review of Systems   Physical Exam Triage Vital Signs ED Triage Vitals  Enc Vitals Group     BP 08/29/20 1226 119/73     Pulse Rate 08/29/20 1226 62     Resp 08/29/20 1226 17     Temp 08/29/20 1226 98.7 F (37.1 C)     Temp Source 08/29/20 1226 Oral     SpO2 08/29/20 1226 98 %     Weight --      Height  --      Head Circumference --      Peak Flow --      Pain Score 08/29/20 1228 8     Pain Loc --      Pain Edu? --      Excl. in GC? --    No data found.  Updated Vital Signs BP 119/73 (BP Location: Left Arm)   Pulse 62   Temp 98.7 F (37.1 C) (Oral)   Resp 17   SpO2 98%   Visual Acuity Right Eye Distance:   Left Eye Distance:   Bilateral Distance:    Right Eye Near:   Left Eye Near:    Bilateral Near:     Physical Exam Vitals and nursing note reviewed.  Constitutional:      General: She is not in acute distress.    Appearance: Normal appearance. She is not ill-appearing, toxic-appearing or diaphoretic.  HENT:     Head: Normocephalic.     Right Ear: Tympanic membrane and ear canal normal.     Left Ear: Tympanic membrane and ear canal normal.     Nose: Nose normal.     Mouth/Throat:     Pharynx: Oropharynx is clear.  Eyes:     Conjunctiva/sclera: Conjunctivae normal.  Cardiovascular:     Rate and Rhythm: Normal rate and regular rhythm.  Pulmonary:     Effort: Pulmonary effort is normal.     Breath sounds: Normal breath sounds.  Musculoskeletal:        General: Normal range of motion.     Cervical back: Normal range of motion.  Skin:    General: Skin is warm and dry.     Findings: No rash.  Neurological:     Mental Status: She is alert.  Psychiatric:        Mood and Affect: Mood normal.      UC Treatments / Results  Labs (all labs ordered are listed, but only abnormal results are displayed) Labs Reviewed  NOVEL CORONAVIRUS, NAA    EKG   Radiology No results found.  Procedures Procedures (including critical care time)  Medications Ordered in UC Medications - No data to display  Initial Impression / Assessment and Plan / UC Course  I have reviewed the triage vital signs and the nursing notes.  Pertinent labs & imaging results that were  available during my care of the patient were reviewed by me and considered in my medical decision making  (see chart for details).     Sore throat Most likely some the viral versus discomfort from acid reflux Recommended restart Pepcid Tylenol and warm salt water gargles for throat covid test pending.  Follow up as needed for continued or worsening symptoms  Final Clinical Impressions(s) / UC Diagnoses   Final diagnoses:  Sore throat     Discharge Instructions     Recommend Tylenol or warm salt water gargles for symptoms.  Your Covid test is pending. Recommended restart the  Pepcid Follow up as needed for continued or worsening symptoms     ED Prescriptions    None     PDMP not reviewed this encounter.   Janace Aris, NP 08/29/20 1325

## 2020-08-29 NOTE — ED Triage Notes (Signed)
Pt c/o sore throat upon waking this morning. Also c/o upper abdominal pain since yesterday with increased belching and dry heaving on Thursday.  Denies n/v/d, fever, cough. States may have been exposed to coworker with possible COVID.  No OTC meds for symptoms. No COVID vaccines.

## 2020-08-29 NOTE — Discharge Instructions (Signed)
Recommend Tylenol or warm salt water gargles for symptoms.  Your Covid test is pending. Recommended restart the  Pepcid Follow up as needed for continued or worsening symptoms

## 2020-09-01 LAB — NOVEL CORONAVIRUS, NAA

## 2020-09-11 ENCOUNTER — Ambulatory Visit
Admission: EM | Admit: 2020-09-11 | Discharge: 2020-09-11 | Disposition: A | Discharge status: Home / Self Care | Payer: Medicaid Other | Attending: Urgent Care | Admitting: Urgent Care

## 2020-09-11 ENCOUNTER — Encounter: Payer: Self-pay | Admitting: Emergency Medicine

## 2020-09-11 ENCOUNTER — Other Ambulatory Visit: Payer: Self-pay

## 2020-09-11 DIAGNOSIS — N3090 Cystitis, unspecified without hematuria: Secondary | ICD-10-CM

## 2020-09-11 DIAGNOSIS — R300 Dysuria: Secondary | ICD-10-CM | POA: Diagnosis present

## 2020-09-11 DIAGNOSIS — Z7251 High risk heterosexual behavior: Secondary | ICD-10-CM

## 2020-09-11 DIAGNOSIS — R350 Frequency of micturition: Secondary | ICD-10-CM

## 2020-09-11 LAB — POCT URINALYSIS DIP (MANUAL ENTRY)
Bilirubin, UA: NEGATIVE
Glucose, UA: NEGATIVE mg/dL
Nitrite, UA: NEGATIVE
Spec Grav, UA: 1.025 (ref 1.010–1.025)
Urobilinogen, UA: 0.2 E.U./dL
pH, UA: 7 (ref 5.0–8.0)

## 2020-09-11 LAB — POCT URINE PREGNANCY: Preg Test, Ur: NEGATIVE

## 2020-09-11 MED ORDER — NITROFURANTOIN MONOHYD MACRO 100 MG PO CAPS
100.0000 mg | ORAL_CAPSULE | Freq: Two times a day (BID) | ORAL | 0 refills | Status: DC
Start: 2020-09-11 — End: 2020-11-04

## 2020-09-11 NOTE — ED Triage Notes (Signed)
Pt sts dysuria x 4 days with frequency

## 2020-09-11 NOTE — Discharge Instructions (Addendum)

## 2020-09-11 NOTE — ED Provider Notes (Signed)
[facility]   MRN: [id number] DOB: 06-30-1996  Subjective:   Melanie Hancock is a 25 y.o. female presenting for 4 day history of dysuria, urinary frequency. She is sexually active with 1 female partner. No vaginal discharge, n/v, fever, genital rashes. Hydrates well with plain water.    No current facility-administered medications for this encounter.  Current Outpatient Medications:  .  acetaminophen (TYLENOL) 500 MG tablet, Take 2 tablets (1,000 mg total) by mouth every 6 (six) hours as needed., Disp: 100 tablet, Rfl: 2 .  benzocaine-Menthol (DERMOPLAST) 20-0.5 % AERO, Apply 1 application topically as needed for irritation (perineal discomfort). (Patient not taking: Reported on 09/11/2020), Disp: , Rfl:  .  cetirizine (ZYRTEC) 10 MG tablet, Take 1 tablet (10 mg total) by mouth daily., Disp: 30 tablet, Rfl: 0 .  coconut oil OIL, Apply 1 application topically as needed. (Patient not taking: Reported on 09/11/2020), Disp: , Rfl: 0 .  ibuprofen (ADVIL) 600 MG tablet, Take 1 tablet (600 mg total) by mouth every 6 (six) hours., Disp: 30 tablet, Rfl: 0 .  Prenatal Vit-Fe Fumarate-FA (PRENATAL MULTIVITAMIN) TABS tablet, Take 1 tablet by mouth daily at 12 noon., Disp: , Rfl:  .  PROCTOFOAM HC rectal foam, Place 1 applicator rectally 2 (two) times daily. (Patient not taking: Reported on 07/17/2020), Disp: , Rfl:    No Known Allergies  Past Medical History:  Diagnosis Date  . Anemia   . Anxiety   . Blood transfusion without reported diagnosis   . Chronic back pain   . Depression   . GERD (gastroesophageal reflux disease)   . Headache   . Infection    UTI  . Post partum depression   . Pyelonephritis   . UTI (lower urinary tract infection)      Past Surgical History:  Procedure Laterality Date  . DILATION AND CURETTAGE OF UTERUS    . INDUCED ABORTION    . WISDOM TOOTH EXTRACTION      Family History  Problem Relation Age of Onset  . Arthritis Mother   . Healthy Father    . Arthritis Maternal Grandmother   . Asthma Son   . Alcohol abuse Neg Hx     Social History   Tobacco Use  . Smoking status: Former Smoker    Quit date: 01/21/2014    Years since quitting: 6.6  . Smokeless tobacco: Never Used  Vaping Use  . Vaping Use: Former  . Substances: Nicotine, Flavoring  Substance Use Topics  . Alcohol use: Not Currently    Comment: rarely    ROS   Objective:   Vitals: BP 111/69 (BP Location: Left Arm)   Pulse 86   Temp 98 F (36.7 C) (Oral)   Resp 18   SpO2 99%   Physical Exam Constitutional:      General: She is not in acute distress.    Appearance: Normal appearance. She is well-developed. She is not ill-appearing.  HENT:     Head: Normocephalic and atraumatic.     Nose: Nose normal.     Mouth/Throat:     Mouth: Mucous membranes are moist.     Pharynx: Oropharynx is clear.  Eyes:     General: No scleral icterus.    Extraocular Movements: Extraocular movements intact.     Pupils: Pupils are equal, round, and reactive to light.  Cardiovascular:     Rate and Rhythm: Normal rate.  Pulmonary:     Effort: Pulmonary effort is normal.  Abdominal:  Tenderness: There is no right CVA tenderness or left CVA tenderness.  Skin:    General: Skin is warm and dry.  Neurological:     General: No focal deficit present.     Mental Status: She is alert and oriented to person, place, and time.  Psychiatric:        Mood and Affect: Mood normal.        Behavior: Behavior normal.        Thought Content: Thought content normal.        Judgment: Judgment normal.     Results for orders placed or performed during the hospital encounter of 09/11/20 (from the past 24 hour(s))  POCT urinalysis dipstick     Status: Abnormal   Collection Time: 09/11/20  3:01 PM  Result Value Ref Range   Color, UA yellow yellow   Clarity, UA cloudy (A) clear   Glucose, UA negative negative mg/dL   Bilirubin, UA negative negative   Ketones, POC UA trace (5) (A)  negative mg/dL   Spec Grav, UA 4.982 6.415 - 1.025   Blood, UA trace-intact (A) negative   pH, UA 7.0 5.0 - 8.0   Protein Ur, POC trace (A) negative mg/dL   Urobilinogen, UA 0.2 0.2 or 1.0 E.U./dL   Nitrite, UA Negative Negative   Leukocytes, UA Small (1+) (A) Negative  POCT urine pregnancy     Status: None   Collection Time: 09/11/20  3:02 PM  Result Value Ref Range   Preg Test, Ur Negative Negative    Assessment and Plan :   PDMP not reviewed this encounter.  1. Cystitis   2. Dysuria   3. Urinary frequency   4. Unprotected sex     Start Macrobid to cover for acute cystitis, urine culture pending.  Recommended continued hydration, limiting urinary irritants.  STI testing pending.  Counseled patient on potential for adverse effects with medications prescribed/recommended today, ER and return-to-clinic precautions discussed, patient verbalized understanding.    Wallis Bamberg, [place] 09/11/20 1538

## 2020-09-12 ENCOUNTER — Ambulatory Visit: Payer: Self-pay

## 2020-09-12 ENCOUNTER — Telehealth (HOSPITAL_COMMUNITY): Payer: Self-pay | Admitting: Emergency Medicine

## 2020-09-12 LAB — CERVICOVAGINAL ANCILLARY ONLY
Bacterial Vaginitis (gardnerella): POSITIVE — AB
Chlamydia: NEGATIVE
Comment: NEGATIVE
Comment: NEGATIVE
Comment: NEGATIVE
Comment: NORMAL
Neisseria Gonorrhea: NEGATIVE
Trichomonas: NEGATIVE

## 2020-09-12 MED ORDER — METRONIDAZOLE 500 MG PO TABS
500.0000 mg | ORAL_TABLET | Freq: Two times a day (BID) | ORAL | 0 refills | Status: DC
Start: 2020-09-12 — End: 2020-11-04

## 2020-09-13 LAB — URINE CULTURE: Culture: 10000 — AB

## 2020-09-14 ENCOUNTER — Encounter (HOSPITAL_COMMUNITY): Payer: Self-pay

## 2020-09-14 ENCOUNTER — Other Ambulatory Visit: Payer: Self-pay

## 2020-09-14 ENCOUNTER — Ambulatory Visit (HOSPITAL_COMMUNITY)
Admission: RE | Admit: 2020-09-14 | Discharge: 2020-09-14 | Disposition: A | Discharge status: Home / Self Care | Payer: Medicaid Other | Source: Ambulatory Visit | Attending: Family Medicine | Admitting: Family Medicine

## 2020-09-14 VITALS — BP 102/69 | HR 67 | Temp 98.6°F | Resp 20

## 2020-09-14 DIAGNOSIS — G43019 Migraine without aura, intractable, without status migrainosus: Secondary | ICD-10-CM | POA: Diagnosis not present

## 2020-09-14 MED ORDER — DEXAMETHASONE SODIUM PHOSPHATE 10 MG/ML IJ SOLN
10.0000 mg | Freq: Once | INTRAMUSCULAR | Status: AC
  Administered 2020-09-14: 10 mg via INTRAMUSCULAR

## 2020-09-14 MED ORDER — DEXAMETHASONE SODIUM PHOSPHATE 10 MG/ML IJ SOLN
INTRAMUSCULAR | Status: AC
  Filled 2020-09-14: qty 1

## 2020-09-14 MED ORDER — ONDANSETRON 4 MG PO TBDP
4.0000 mg | ORAL_TABLET | Freq: Once | ORAL | Status: AC
  Administered 2020-09-14: 4 mg via ORAL

## 2020-09-14 MED ORDER — KETOROLAC TROMETHAMINE 30 MG/ML IJ SOLN
INTRAMUSCULAR | Status: AC
  Filled 2020-09-14: qty 1

## 2020-09-14 MED ORDER — ONDANSETRON 4 MG PO TBDP
ORAL_TABLET | ORAL | Status: AC
  Filled 2020-09-14: qty 1

## 2020-09-14 MED ORDER — KETOROLAC TROMETHAMINE 30 MG/ML IJ SOLN
30.0000 mg | Freq: Once | INTRAMUSCULAR | Status: AC
  Administered 2020-09-14: 30 mg via INTRAMUSCULAR

## 2020-09-14 NOTE — ED Triage Notes (Signed)
Pt presents with ongoing migraine X 2 weeks; pt states she has some nausea and sensitivity to light.

## 2020-09-17 NOTE — ED Provider Notes (Signed)
[facility]   [id number] 09/14/20 Arrival Time: 1409  ASSESSMENT & PLAN:  1. Intractable migraine without aura and without status migrainosus     Given: Meds ordered this encounter  Medications  . ketorolac (TORADOL) 30 MG/ML injection 30 mg  . dexamethasone (DECADRON) injection 10 mg  . ondansetron (ZOFRAN-ODT) disintegrating tablet 4 mg   Normal neurological exam. Afebrile without nuchal rigidity. Discussed. Current presentation and symptoms are consistent with prior migraines. Without fever, focal neuro logical deficits, nuchal rigidity, or change in vision. No indication for neurodiagnostic workup at this time. Discussed.  Recommend:  Follow-up Information    [facility] EMERGENCY DEPARTMENT.   Specialty: Emergency Medicine Why: If symptoms worsen in any way. Contact information: [address] [id number] mc [place] [address] [phone]       Schedule an appointment as soon as possible for a visit  with [facility].   Specialty: Internal Medicine Contact information: [address] [place] [address] [phone]                Reviewed expectations re: course of current medical issues. Questions answered. Outlined signs and symptoms indicating need for more acute intervention. Patient verbalized understanding. After Visit Summary given.   SUBJECTIVE: History from: Patient Patient is able to give a clear and coherent history.  Melanie Hancock is a 25 y.o. female who presents with complaint of a migraine headache. Onset gradual, on/off over past two weeks. Location: frontal without radiation. History of headaches: yes. Precipitating factors include: none which have been determined. Associated symptoms: Preceding aura: no. Nausea/vomiting: n without v. Vision changes: no. Increased sensitivity to light and to noises: yes. Fever: no. Sinus pressure/congestion: no. Extremity weakness:  no. Home treatment has included OTC analgesics with little improvement. Current headache has limited normal daily activities. Denies dizziness, loss of balance, muscle weakness, numbness of extremities, speech difficulties and vision problems. No head injury reported. Ambulatory without difficulty. No recent travel.   OBJECTIVE:  Vitals:   09/14/20 1434  BP: 102/69  Pulse: 67  Resp: 20  Temp: 98.6 F (37 C)  TempSrc: Oral  SpO2: 98%    General appearance: alert; NAD HENT: normocephalic; atraumatic Eyes: PERRLA; EOMI; conjunctivae normal Neck: supple with FROM Lungs: clear to auscultation bilaterally; unlabored respirations Heart: regular rate and rhythm Extremities: no edema; symmetrical with no gross deformities Skin: warm and dry Neurologic: alert; speech is fluent and clear without dysarthria or aphasia; CN 2-12 grossly intact Psychological: alert and cooperative; normal mood and affect   No Known Allergies  Past Medical History:  Diagnosis Date  . Anemia   . Anxiety   . Blood transfusion without reported diagnosis   . Chronic back pain   . Depression   . GERD (gastroesophageal reflux disease)   . Headache   . Infection    UTI  . Post partum depression   . Pyelonephritis   . UTI (lower urinary tract infection)    Social History   Socioeconomic History  . Marital status: Single    Spouse name: Amiah Frohlich  . Number of children: 3  . Years of education: Not on file  . Highest education level: Associate degree: occupational, Scientist, product/process development, or vocational program  Occupational History    Employer: [employer]  Tobacco Use  . Smoking status: Former Smoker    Quit date: 01/21/2014    Years since quitting: 6.6  . Smokeless tobacco: Never Used  Vaping Use  . Vaping Use:  Former  . Substances: Nicotine, Flavoring  Substance and Sexual Activity  . Alcohol use: Not Currently    Comment: rarely  . Drug use: Not on file    Comment: 2-3 weeks ago  . Sexual  activity: Yes  Other Topics Concern  . Not on file  Social History Narrative  . Not on file   Social Determinants of Health   Financial Resource Strain: Not on file  Food Insecurity: Not on file  Transportation Needs: Not on file  Physical Activity: Not on file  Stress: Not on file  Social Connections: Not on file  Intimate Partner Violence: Not on file   Family History  Problem Relation Age of Onset  . Arthritis Mother   . Healthy Father   . Arthritis Maternal Grandmother   . Asthma Son   . Alcohol abuse Neg Hx    Past Surgical History:  Procedure Laterality Date  . DILATION AND CURETTAGE OF UTERUS    . INDUCED ABORTION    . WISDOM TOOTH EXTRACTION       Mardella Layman, MD 09/17/20 1009

## 2020-10-17 ENCOUNTER — Other Ambulatory Visit: Payer: Self-pay

## 2020-10-17 ENCOUNTER — Ambulatory Visit
Admission: RE | Admit: 2020-10-17 | Discharge: 2020-10-17 | Disposition: A | Discharge status: Home / Self Care | Payer: Medicaid Other | Source: Ambulatory Visit | Attending: Emergency Medicine | Admitting: Emergency Medicine

## 2020-10-17 VITALS — BP 126/90 | HR 68 | Temp 98.3°F | Resp 18

## 2020-10-17 DIAGNOSIS — M546 Pain in thoracic spine: Secondary | ICD-10-CM | POA: Diagnosis not present

## 2020-10-17 DIAGNOSIS — S5001XA Contusion of right elbow, initial encounter: Secondary | ICD-10-CM | POA: Diagnosis not present

## 2020-10-17 MED ORDER — IBUPROFEN 800 MG PO TABS
800.0000 mg | ORAL_TABLET | Freq: Three times a day (TID) | ORAL | 0 refills | Status: DC
Start: 2020-10-17 — End: 2020-12-07

## 2020-10-17 MED ORDER — CYCLOBENZAPRINE HCL 5 MG PO TABS: 5.0000 mg | ORAL_TABLET | Freq: Two times a day (BID) | ORAL | 0 refills | Status: DC | PRN

## 2020-10-17 NOTE — ED Triage Notes (Signed)
Pt reports the rail broke on the stairs Sat night and she fell on to RT side . She was holding her baby in the Ltr arm. Pt reports back spasms.rt, rt elbow pain. Pt di not hit her head . Pt does reort the back of her neck hit the stairs. Pt ambulatory to room with out assistance . No acute distress noted.

## 2020-10-17 NOTE — Discharge Instructions (Addendum)
Use anti-inflammatories for pain/swelling. You may take up to 800 mg Ibuprofen every 8 hours with food. You may supplement Ibuprofen with Tylenol [id number] mg every 8 hours.   You may use flexeril as needed to help with pain. This is a muscle relaxer and causes sedation- please use only at bedtime or when you will be home and not have to drive/work  Follow up if not improving

## 2020-10-19 NOTE — ED Provider Notes (Signed)
[facility]    CSN: [id number] Arrival date & time: 10/17/20  1756      History   Chief Complaint Chief Complaint  Patient presents with  . Fall    HPI Melanie Hancock is a 25 y.o. female presenting today for evaluation of back and elbow pain after a fall.  Patient reports accidentally slipping down hardwood floors and landed on her back and hit her right elbow.  Since she has had soreness across her middle back as well as some slight soreness in her elbow.  She denies difficulty moving the elbow or prior fracture.  Denies difficulty breathing or shortness of breath.  Denies changes in urination or bowel movements.  Denies radiation into extremities.  Denies numbness or tingling.  Denies LOC, but does believe she did hit her head either on railing of stairs or car seat.  HPI  Past Medical History:  Diagnosis Date  . Anemia   . Anxiety   . Blood transfusion without reported diagnosis   . Chronic back pain   . Depression   . GERD (gastroesophageal reflux disease)   . Headache   . Infection    UTI  . Post partum depression   . Pyelonephritis   . UTI (lower urinary tract infection)     Patient Active Problem List   Diagnosis Date Noted  . Postpartum care following vaginal delivery 8/4 03/24/2020  . Acute intractable headache 03/14/2020  . Vaginal delivery 02/04/2016  . History of postpartum depression, currently pregnant 01/23/2016  . Iron deficiency anemia 01/02/2016  . 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 Trichomoniasis 06/17/2015  . Anxiety 03/28/2014  . Chronic back pain 03/28/2014  . Hx pyelonephritis 2014 05/26/2013    Past Surgical History:  Procedure Laterality Date  . DILATION AND CURETTAGE OF UTERUS    . INDUCED ABORTION    . WISDOM TOOTH EXTRACTION      OB History    Gravida  5   Para  4   Term  4   Preterm      AB  1   Living  4     SAB  0   IAB  1   Ectopic      Multiple  0   Live Births  4             Home Medications    Prior to Admission medications   Medication Sig Start Date End Date Taking? Authorizing Provider  cyclobenzaprine (FLEXERIL) 5 MG tablet Take 1-2 tablets (5-10 mg total) by mouth 2 (two) times daily as needed for muscle spasms. 10/17/20  Yes Maxmillian Carsey C, PA-C  ibuprofen (ADVIL) 800 MG tablet Take 1 tablet (800 mg total) by mouth 3 (three) times daily. 10/17/20  Yes Lounell Schumacher C, PA-C  acetaminophen (TYLENOL) 500 MG tablet Take 2 tablets (1,000 mg total) by mouth every 6 (six) hours as needed. 03/24/20 03/24/21  Neta Mends, CNM  cetirizine (ZYRTEC) 10 MG tablet Take 1 tablet (10 mg total) by mouth daily. 03/25/20   Eustace Moore, MD  metroNIDAZOLE (FLAGYL) 500 MG tablet Take 1 tablet (500 mg total) by mouth 2 (two) times daily. 09/12/20   LampteyBritta Mccreedy, MD  nitrofurantoin, macrocrystal-monohydrate, (MACROBID) 100 MG capsule Take 1 capsule (100 mg total) by mouth 2 (two) times daily. 09/11/20   Wallis Bamberg, PA-C  Prenatal Vit-Fe Fumarate-FA (PRENATAL MULTIVITAMIN) TABS tablet Take 1 tablet by mouth daily at 12 noon.  [provider]  pantoprazole (PROTONIX) 20 MG tablet Take 1 tablet (20 mg total) by mouth daily. 09/17/19 11/12/19  Aviva Signs, CNM    Family History Family History  Problem Relation Age of Onset  . Arthritis Mother   . Healthy Father   . Arthritis Maternal Grandmother   . Asthma Son   . Alcohol abuse Neg Hx     Social History Social History   Tobacco Use  . Smoking status: Former Smoker    Quit date: 01/21/2014    Years since quitting: 6.7  . Smokeless tobacco: Never Used  Vaping Use  . Vaping Use: Former  . Substances: Nicotine, Flavoring  Substance Use Topics  . Alcohol use: Not Currently    Comment: rarely     Allergies   Patient has no known allergies.   Review of Systems Review of Systems  Constitutional: Negative for activity change, chills, diaphoresis and fatigue.  HENT: Negative for  ear pain, tinnitus and trouble swallowing.   Eyes: Negative for photophobia and visual disturbance.  Respiratory: Negative for cough, chest tightness and shortness of breath.   Cardiovascular: Negative for chest pain and leg swelling.  Gastrointestinal: Negative for abdominal pain, blood in stool, nausea and vomiting.  Musculoskeletal: Positive for arthralgias, back pain and myalgias. Negative for gait problem, neck pain and neck stiffness.  Skin: Negative for color change and wound.  Neurological: Negative for dizziness, weakness, light-headedness, numbness and headaches.     Physical Exam Triage Vital Signs ED Triage Vitals  Enc Vitals Group     BP 10/17/20 1811 126/90     Pulse Rate 10/17/20 1811 68     Resp 10/17/20 1811 18     Temp 10/17/20 1811 98.3 F (36.8 C)     Temp Source 10/17/20 1811 Oral     SpO2 10/17/20 1811 98 %     Weight --      Height --      Head Circumference --      Peak Flow --      Pain Score 10/17/20 1816 9     Pain Loc --      Pain Edu? --      Excl. in GC? --    No data found.  Updated Vital Signs BP 126/90 (BP Location: Left Arm)   Pulse 68   Temp 98.3 F (36.8 C) (Oral)   Resp 18   LMP  (Exact Date) Comment: no cycles with injection  SpO2 100%   Visual Acuity Right Eye Distance:   Left Eye Distance:   Bilateral Distance:    Right Eye Near:   Left Eye Near:    Bilateral Near:     Physical Exam Vitals and nursing note reviewed.  Constitutional:      Appearance: She is well-developed and well-nourished.     Comments: No acute distress  HENT:     Head: Normocephalic and atraumatic.     Nose: Nose normal.  Eyes:     Conjunctiva/sclera: Conjunctivae normal.  Cardiovascular:     Rate and Rhythm: Normal rate and regular rhythm.  Pulmonary:     Effort: Pulmonary effort is normal. No respiratory distress.     Comments: Breathing comfortably at rest, CTABL, no wheezing, rales or other adventitious sounds auscultated  Abdominal:      General: There is no distension.  Musculoskeletal:        General: Normal range of motion.     Cervical back: Neck supple.  Comments: Right elbow: Moving right arm freely, in the right elbow willingly, full active range of motion, mild tenderness to palpation on medial aspect of elbow, radial pulse 2+  Back: Nontender palpation of cervical thoracic or lumbar spine midline, increased tenderness throughout bilateral lower thoracic musculature  Full active range of motion of extremities  Skin:    General: Skin is warm and dry.  Neurological:     General: No focal deficit present.     Mental Status: She is alert and oriented to person, place, and time. Mental status is at baseline.     Cranial Nerves: No cranial nerve deficit.     Motor: No weakness.  Psychiatric:        Mood and Affect: Mood and affect normal.      UC Treatments / Results  Labs (all labs ordered are listed, but only abnormal results are displayed) Labs Reviewed - No data to display  EKG   Radiology No results found.  Procedures Procedures (including critical care time)  Medications Ordered in UC Medications - No data to display  Initial Impression / Assessment and Plan / UC Course  I have reviewed the triage vital signs and the nursing notes.  Pertinent labs & imaging results that were available during my care of the patient were reviewed by me and considered in my medical decision making (see chart for details).     Suspect likely muscle contusion/straining of thoracic area from impact along with elbow contusion.  No limitations in movement, no red flags, low suspicion of fracture at this time and recommending management with anti-inflammatories muscle relaxers, ice and monitor for gradual resolution.  Discussed strict return precautions. Patient verbalized understanding and is agreeable with plan.  Final Clinical Impressions(s) / UC Diagnoses   Final diagnoses:  Acute bilateral thoracic back  pain  Contusion of right elbow, initial encounter     Discharge Instructions     Use anti-inflammatories for pain/swelling. You may take up to 800 mg Ibuprofen every 8 hours with food. You may supplement Ibuprofen with Tylenol [phone] mg every 8 hours.   You may use flexeril as needed to help with pain. This is a muscle relaxer and causes sedation- please use only at bedtime or when you will be home and not have to drive/work  Follow up if not improving   ED Prescriptions    Medication Sig Dispense Auth. Provider   ibuprofen (ADVIL) 800 MG tablet Take 1 tablet (800 mg total) by mouth 3 (three) times daily. 21 tablet Nell Gales C, PA-C   cyclobenzaprine (FLEXERIL) 5 MG tablet Take 1-2 tablets (5-10 mg total) by mouth 2 (two) times daily as needed for muscle spasms. 24 tablet Chalsey Leeth, [place] C, PA-C     PDMP not reviewed this encounter.   Lew Dawes, [place] 10/19/20 [phone]

## 2020-11-04 ENCOUNTER — Other Ambulatory Visit: Payer: Self-pay

## 2020-11-04 ENCOUNTER — Ambulatory Visit
Admission: RE | Admit: 2020-11-04 | Discharge: 2020-11-04 | Disposition: A | Discharge status: Home / Self Care | Payer: Medicaid Other | Source: Ambulatory Visit | Attending: Emergency Medicine | Admitting: Emergency Medicine

## 2020-11-04 VITALS — BP 111/69 | HR 85 | Temp 98.3°F | Resp 16

## 2020-11-04 DIAGNOSIS — R21 Rash and other nonspecific skin eruption: Secondary | ICD-10-CM

## 2020-11-04 DIAGNOSIS — L239 Allergic contact dermatitis, unspecified cause: Secondary | ICD-10-CM | POA: Diagnosis not present

## 2020-11-04 MED ORDER — TRIAMCINOLONE ACETONIDE 0.1 % EX CREA: 1.0000 "application " | TOPICAL_CREAM | Freq: Two times a day (BID) | CUTANEOUS | 0 refills | Status: DC

## 2020-11-04 MED ORDER — CETIRIZINE HCL 10 MG PO CAPS: 10.0000 mg | ORAL_CAPSULE | Freq: Every day | ORAL | 0 refills | Status: DC

## 2020-11-04 MED ORDER — PREDNISONE 10 MG PO TABS: ORAL_TABLET | ORAL | 0 refills | Status: DC

## 2020-11-04 NOTE — Discharge Instructions (Signed)
Begin prednisone taper over the next 6 days-begin with 6 tablets, decrease by 1 tablet each day until complete-6, 5, 4, 3, 2, 1-take with food Daily cetirizine/Zyrtec in the morning, supplement Benadryl in the evening May use triamcinolone cream twice daily to areas that are still causing a lot of itching Please follow-up if rash not resolving or worsening/changing

## 2020-11-04 NOTE — ED Triage Notes (Signed)
Pt presents today with c/o of rash (generalized) x 3 days. Denies new medications or change in soap.

## 2020-11-04 NOTE — ED Provider Notes (Signed)
[facility]    CSN: [id number] Arrival date & time: 11/04/20  0858      History   Chief Complaint Chief Complaint  Patient presents with  . Rash    HPI Melanie Hancock is a 25 y.o. female presenting today for evaluation of a rash.  Reports rash to chest x3 days.  Associated with itching.  Denies any new medicines foods, changes in hygiene products, denies any new soaps lotions or detergents.  Does report she recently did wear a new bra, but this does not have any metal in it, reports washing it prior to wearing.  Denies any difficulty breathing or shortness of breath. HPI  Past Medical History:  Diagnosis Date  . Anemia   . Anxiety   . Blood transfusion without reported diagnosis   . Chronic back pain   . Depression   . GERD (gastroesophageal reflux disease)   . Headache   . Infection    UTI  . Post partum depression   . Pyelonephritis   . UTI (lower urinary tract infection)     Patient Active Problem List   Diagnosis Date Noted  . Postpartum care following vaginal delivery 8/4 03/24/2020  . Acute intractable headache 03/14/2020  . Vaginal delivery 02/04/2016  . History of postpartum depression, currently pregnant 01/23/2016  . Iron deficiency anemia 01/02/2016  . 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 Trichomoniasis 06/17/2015  . Anxiety 03/28/2014  . Chronic back pain 03/28/2014  . Hx pyelonephritis 2014 05/26/2013    Past Surgical History:  Procedure Laterality Date  . DILATION AND CURETTAGE OF UTERUS    . INDUCED ABORTION    . WISDOM TOOTH EXTRACTION      OB History    Gravida  5   Para  4   Term  4   Preterm      AB  1   Living  4     SAB  0   IAB  1   Ectopic      Multiple  0   Live Births  4            Home Medications    Prior to Admission medications   Medication Sig Start Date End Date Taking? Authorizing Provider  Cetirizine HCl 10 MG CAPS Take 1 capsule (10 mg total) by mouth daily  for 10 days. 11/04/20 11/14/20 Yes Donyae Kilner C, PA-C  predniSONE (DELTASONE) 10 MG tablet Begin with 6 tabs on day 1, 5 tab on day 2, 4 tab on day 3, 3 tab on day 4, 2 tab on day 5, 1 tab on day 6-take with food 11/04/20  Yes [place] Kuang C, PA-C  triamcinolone (KENALOG) 0.1 % Apply 1 application topically 2 (two) times daily. 11/04/20  Yes Niara Bunker C, PA-C  acetaminophen (TYLENOL) 500 MG tablet Take 2 tablets (1,000 mg total) by mouth every 6 (six) hours as needed. 03/24/20 03/24/21  Neta Mends, CNM  cyclobenzaprine (FLEXERIL) 5 MG tablet Take 1-2 tablets (5-10 mg total) by mouth 2 (two) times daily as needed for muscle spasms. 10/17/20   Aanika Defoor C, PA-C  ibuprofen (ADVIL) 800 MG tablet Take 1 tablet (800 mg total) by mouth 3 (three) times daily. 10/17/20   Quamel Fitzmaurice C, PA-C  pantoprazole (PROTONIX) 20 MG tablet Take 1 tablet (20 mg total) by mouth daily. 09/17/19 11/12/19  Aviva Signs, CNM    Family History Family History  Problem Relation Age  of Onset  . Arthritis Mother   . Healthy Father   . Arthritis Maternal Grandmother   . Asthma Son   . Alcohol abuse Neg Hx     Social History Social History   Tobacco Use  . Smoking status: Former Smoker    Quit date: 01/21/2014    Years since quitting: 6.7  . Smokeless tobacco: Never Used  Vaping Use  . Vaping Use: Former  . Substances: Nicotine, Flavoring  Substance Use Topics  . Alcohol use: Not Currently    Comment: rarely     Allergies   Patient has no known allergies.   Review of Systems Review of Systems  Constitutional: Negative for fatigue and fever.  HENT: Negative for mouth sores.   Eyes: Negative for visual disturbance.  Respiratory: Negative for shortness of breath.   Cardiovascular: Negative for chest pain.  Gastrointestinal: Negative for abdominal pain, nausea and vomiting.  Genitourinary: Negative for genital sores.  Musculoskeletal: Negative for arthralgias and joint swelling.   Skin: Positive for color change and rash. Negative for wound.  Neurological: Negative for dizziness, weakness, light-headedness and headaches.     Physical Exam Triage Vital Signs ED Triage Vitals  Enc Vitals Group     BP 11/04/20 0907 111/69     Pulse Rate 11/04/20 0907 85     Resp 11/04/20 0907 16     Temp 11/04/20 0907 98.3 F (36.8 C)     Temp Source 11/04/20 0907 Oral     SpO2 11/04/20 0907 97 %     Weight --      Height --      Head Circumference --      Peak Flow --      Pain Score 11/04/20 0908 0     Pain Loc --      Pain Edu? --      Excl. in GC? --    No data found.  Updated Vital Signs BP 111/69 (BP Location: Left Arm)   Pulse 85   Temp 98.3 F (36.8 C) (Oral)   Resp 16   SpO2 97%   Visual Acuity Right Eye Distance:   Left Eye Distance:   Bilateral Distance:    Right Eye Near:   Left Eye Near:    Bilateral Near:     Physical Exam Vitals and nursing note reviewed.  Constitutional:      Appearance: She is well-developed.     Comments: No acute distress  HENT:     Head: Normocephalic and atraumatic.     Nose: Nose normal.     Mouth/Throat:     Comments: Oral mucosa pink and moist, no tonsillar enlargement or exudate. Posterior pharynx patent and nonerythematous, no uvula deviation or swelling. Normal phonation. Eyes:     Conjunctiva/sclera: Conjunctivae normal.  Cardiovascular:     Rate and Rhythm: Normal rate.  Pulmonary:     Effort: Pulmonary effort is normal. No respiratory distress.     Comments: Breathing comfortably at rest, CTABL, no wheezing, rales or other adventitious sounds auscultated  Abdominal:     General: There is no distension.  Musculoskeletal:        General: Normal range of motion.     Cervical back: Neck supple.  Skin:    General: Skin is warm and dry.     Comments: Chest and upper abdomen with erythematous papular areas noted, blanchable  Neurological:     Mental Status: She is alert and oriented to person, place,  and  time.      UC Treatments / Results  Labs (all labs ordered are listed, but only abnormal results are displayed) Labs Reviewed - No data to display  EKG   Radiology No results found.  Procedures Procedures (including critical care time)  Medications Ordered in UC Medications - No data to display  Initial Impression / Assessment and Plan / UC Course  I have reviewed the triage vital signs and the nursing notes.  Pertinent labs & imaging results that were available during my care of the patient were reviewed by me and considered in my medical decision making (see chart for details).     Rash does appear allergic in nature versus contact dermatitis, no airway involvement, recommending prednisone taper x6 days, antihistamines and topical steroid cream as needed Continue to monitor, follow-up if not improving or worsening.  Discussed strict return precautions. Patient verbalized understanding and is agreeable with plan.  Final Clinical Impressions(s) / UC Diagnoses   Final diagnoses:  Rash and nonspecific skin eruption  Allergic contact dermatitis, unspecified trigger     Discharge Instructions     Begin prednisone taper over the next 6 days-begin with 6 tablets, decrease by 1 tablet each day until complete-6, 5, 4, 3, 2, 1-take with food Daily cetirizine/Zyrtec in the morning, supplement Benadryl in the evening May use triamcinolone cream twice daily to areas that are still causing a lot of itching Please follow-up if rash not resolving or worsening/changing    ED Prescriptions    Medication Sig Dispense Auth. Provider   predniSONE (DELTASONE) 10 MG tablet Begin with 6 tabs on day 1, 5 tab on day 2, 4 tab on day 3, 3 tab on day 4, 2 tab on day 5, 1 tab on day 6-take with food 21 tablet Desta Bujak C, PA-C   Cetirizine HCl 10 MG CAPS Take 1 capsule (10 mg total) by mouth daily for 10 days. 10 capsule Wrenly Lauritsen C, PA-C   triamcinolone (KENALOG) 0.1 %  Apply 1 application topically 2 (two) times daily. 30 g Elizabella Nolet, [place] C, PA-C     PDMP not reviewed this encounter.   Lew Dawes, [place] 11/04/20 [phone]

## 2020-11-16 ENCOUNTER — Ambulatory Visit (HOSPITAL_COMMUNITY)
Admission: RE | Admit: 2020-11-16 | Discharge: 2020-11-16 | Disposition: A | Discharge status: Home / Self Care | Payer: Medicaid Other | Source: Ambulatory Visit | Attending: Family Medicine | Admitting: Family Medicine

## 2020-11-16 ENCOUNTER — Other Ambulatory Visit: Payer: Self-pay

## 2020-11-16 ENCOUNTER — Encounter (HOSPITAL_COMMUNITY): Payer: Self-pay

## 2020-11-16 VITALS — BP 125/84 | HR 66 | Temp 98.2°F | Resp 18

## 2020-11-16 DIAGNOSIS — Z113 Encounter for screening for infections with a predominantly sexual mode of transmission: Secondary | ICD-10-CM | POA: Diagnosis not present

## 2020-11-16 DIAGNOSIS — N760 Acute vaginitis: Secondary | ICD-10-CM | POA: Diagnosis present

## 2020-11-16 MED ORDER — METRONIDAZOLE 500 MG PO TABS: 500.0000 mg | ORAL_TABLET | Freq: Two times a day (BID) | ORAL | 0 refills | Status: DC

## 2020-11-16 NOTE — ED Triage Notes (Signed)
Patient presents to [facility] for assessment of change in vaginal discharge, x 1 sexual partner currently, denies known exposure to STD but concerned due to having thinner than normal and odorous discharge.  Hx of BV.

## 2020-11-16 NOTE — Discharge Instructions (Signed)
May try boric acid suppositories over the counter as needed for good pH support

## 2020-11-17 LAB — CERVICOVAGINAL ANCILLARY ONLY
Bacterial Vaginitis (gardnerella): POSITIVE — AB
Candida Glabrata: NEGATIVE
Candida Vaginitis: NEGATIVE
Chlamydia: NEGATIVE
Comment: NEGATIVE
Comment: NEGATIVE
Comment: NEGATIVE
Comment: NEGATIVE
Comment: NEGATIVE
Comment: NORMAL
Neisseria Gonorrhea: NEGATIVE
Trichomonas: POSITIVE — AB

## 2020-11-17 NOTE — ED Provider Notes (Signed)
[facility]    CSN: [id number] Arrival date & time: 11/16/20  1914      History   Chief Complaint Chief Complaint  Patient presents with  . std testing  . APPT: 7:00pm    HPI Melanie Hancock is a 25 y.o. female.   Patient presenting today with 1 week history of abnormal vaginal discharge with foul odor.  She states she is only had 1 sexual partner recently and has been tested since onset of this relationship so does not think that she has an STD causing symptoms.  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  She denies dysuria, hematuria, abdominal pain, nausea vomiting diarrhea, rashes or lesions.  Has not been trying anything over-the-counter for symptoms at this time.    Past Medical History:  Diagnosis Date  . Anemia   . Anxiety   . Blood transfusion without reported diagnosis   . Chronic back pain   . Depression   . GERD (gastroesophageal reflux disease)   . Headache   . Infection    UTI  . Post partum depression   . Pyelonephritis   . UTI (lower urinary tract infection)     Patient Active Problem List   Diagnosis Date Noted  . Postpartum care following vaginal delivery 8/4 03/24/2020  . Acute intractable headache 03/14/2020  . Vaginal delivery 02/04/2016  . History of postpartum depression, currently pregnant 01/23/2016  . Iron deficiency anemia 01/02/2016  . 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 Trichomoniasis 06/17/2015  . Anxiety 03/28/2014  . Chronic back pain 03/28/2014  . Hx pyelonephritis 2014 05/26/2013    Past Surgical History:  Procedure Laterality Date  . DILATION AND CURETTAGE OF UTERUS    . INDUCED ABORTION    . WISDOM TOOTH EXTRACTION      OB History    Gravida  5   Para  4   Term  4   Preterm      AB  1   Living  4     SAB  0   IAB  1   Ectopic      Multiple  0   Live Births  4            Home Medications    Prior to Admission medications    Medication Sig Start Date End Date Taking? Authorizing Provider  metroNIDAZOLE (FLAGYL) 500 MG tablet Take 1 tablet (500 mg total) by mouth 2 (two) times daily. 11/16/20  Yes Particia Nearing, PA-C  acetaminophen (TYLENOL) 500 MG tablet Take 2 tablets (1,000 mg total) by mouth every 6 (six) hours as needed. 03/24/20 03/24/21  Neta Mends, CNM  Cetirizine HCl 10 MG CAPS Take 1 capsule (10 mg total) by mouth daily for 10 days. 11/04/20 11/14/20  Wieters, Hallie C, PA-C  cyclobenzaprine (FLEXERIL) 5 MG tablet Take 1-2 tablets (5-10 mg total) by mouth 2 (two) times daily as needed for muscle spasms. 10/17/20   Wieters, Hallie C, PA-C  ibuprofen (ADVIL) 800 MG tablet Take 1 tablet (800 mg total) by mouth 3 (three) times daily. 10/17/20   Wieters, Hallie C, PA-C  predniSONE (DELTASONE) 10 MG tablet Begin with 6 tabs on day 1, 5 tab on day 2, 4 tab on day 3, 3 tab on day 4, 2 tab on day 5, 1 tab on day 6-take with food 11/04/20   Wieters, Hallie C, PA-C  triamcinolone (KENALOG) 0.1 % Apply  1 application topically 2 (two) times daily. 11/04/20   Wieters, Hallie C, PA-C  pantoprazole (PROTONIX) 20 MG tablet Take 1 tablet (20 mg total) by mouth daily. 09/17/19 11/12/19  Aviva Signs, CNM    Family History Family History  Problem Relation Age of Onset  . Arthritis Mother   . Healthy Father   . Arthritis Maternal Grandmother   . Asthma Son   . Alcohol abuse Neg Hx     Social History Social History   Tobacco Use  . Smoking status: Former Smoker    Quit date: 01/21/2014    Years since quitting: 6.8  . Smokeless tobacco: Never Used  Vaping Use  . Vaping Use: Former  . Substances: Nicotine, Flavoring  Substance Use Topics  . Alcohol use: Not Currently    Comment: rarely     Allergies   Patient has no known allergies.   Review of Systems Review of Systems Per HPI  Physical Exam Triage Vital Signs ED Triage Vitals  Enc Vitals Group     BP 11/16/20 1925 125/84     Pulse Rate  11/16/20 1925 66     Resp 11/16/20 1925 18     Temp 11/16/20 1925 98.2 F (36.8 C)     Temp Source 11/16/20 1925 Oral     SpO2 11/16/20 1925 96 %     Weight --      Height --      Head Circumference --      Peak Flow --      Pain Score 11/16/20 1926 0     Pain Loc --      Pain Edu? --      Excl. in GC? --    No data found.  Updated Vital Signs BP 125/84 (BP Location: Right Arm)   Pulse 66   Temp 98.2 F (36.8 C) (Oral)   Resp 18   SpO2 96%   Visual Acuity Right Eye Distance:   Left Eye Distance:   Bilateral Distance:    Right Eye Near:   Left Eye Near:    Bilateral Near:     Physical Exam Vitals and nursing note reviewed.  Constitutional:      Appearance: Normal appearance. She is not ill-appearing.  HENT:     Head: Atraumatic.  Eyes:     Extraocular Movements: Extraocular movements intact.     Conjunctiva/sclera: Conjunctivae normal.  Cardiovascular:     Rate and Rhythm: Normal rate and regular rhythm.     Heart sounds: Normal heart sounds.  Pulmonary:     Effort: Pulmonary effort is normal.     Breath sounds: Normal breath sounds.  Abdominal:     General: Bowel sounds are normal. There is no distension.     Palpations: Abdomen is soft.     Tenderness: There is no abdominal tenderness. There is no right CVA tenderness, left CVA tenderness or guarding.  Genitourinary:    Comments: GU exam deferred, self swab performed Musculoskeletal:        General: Normal range of motion.     Cervical back: Normal range of motion and neck supple.  Skin:    General: Skin is warm and dry.  Neurological:     Mental Status: She is alert and oriented to person, place, and time.  Psychiatric:        Mood and Affect: Mood normal.        Thought Content: Thought content normal.  Judgment: Judgment normal.      UC Treatments / Results  Labs (all labs ordered are listed, but only abnormal results are displayed) Labs Reviewed  CERVICOVAGINAL ANCILLARY ONLY -  Abnormal; Notable for the following components:      Result Value   Trichomonas Positive (*)    Bacterial Vaginitis (gardnerella) Positive (*)    All other components within normal limits    EKG   Radiology No results found.  Procedures Procedures (including critical care time)  Medications Ordered in UC Medications - No data to display  Initial Impression / Assessment and Plan / UC Course  I have reviewed the triage vital signs and the nursing notes.  Pertinent labs & imaging results that were available during my care of the patient were reviewed by me and considered in my medical decision making (see chart for details).     Suspect a recurrence of her her past BV infections, but vaginal swab pending at this time.  Will make adjustments based on these results if needed.  Did recommend boric acid suppositories for preventative measures over-the-counter and discussed good vaginal hygiene.  Follow-up with OB/GYN if persisting symptoms.  Final Clinical Impressions(s) / UC Diagnoses   Final diagnoses:  Acute vaginitis  Routine screening for STI (sexually transmitted infection)     Discharge Instructions     May try boric acid suppositories over the counter as needed for good pH support    ED Prescriptions    Medication Sig Dispense Auth. Provider   metroNIDAZOLE (FLAGYL) 500 MG tablet Take 1 tablet (500 mg total) by mouth 2 (two) times daily. 14 tablet Particia Nearing, [place]     PDMP not reviewed this encounter.   Particia Nearing, [place] 11/17/20 2041

## 2020-12-07 ENCOUNTER — Ambulatory Visit
Admission: RE | Admit: 2020-12-07 | Discharge: 2020-12-07 | Disposition: A | Discharge status: Home / Self Care | Payer: Medicaid Other | Source: Ambulatory Visit | Attending: Emergency Medicine | Admitting: Emergency Medicine

## 2020-12-07 ENCOUNTER — Other Ambulatory Visit: Payer: Self-pay

## 2020-12-07 VITALS — BP 121/71 | HR 85 | Temp 98.2°F | Resp 18

## 2020-12-07 DIAGNOSIS — R519 Headache, unspecified: Secondary | ICD-10-CM | POA: Diagnosis not present

## 2020-12-07 MED ORDER — BUTALBITAL-APAP-CAFFEINE 50-325-40 MG PO TABS: 1.0000 | ORAL_TABLET | Freq: Four times a day (QID) | ORAL | 0 refills | Status: DC | PRN

## 2020-12-07 MED ORDER — NAPROXEN 500 MG PO TABS: 500.0000 mg | ORAL_TABLET | Freq: Two times a day (BID) | ORAL | 0 refills | Status: DC

## 2020-12-07 NOTE — ED Provider Notes (Signed)
[facility]    CSN: [id number] Arrival date & time: 12/07/20  1603      History   Chief Complaint Chief Complaint  Patient presents with  . Appointment    1600  . Headache    HPI Melanie Hancock is a 25 y.o. female history of GERD, presenting today for evaluation of headaches.  Reports headaches to frontal area as well as occipital area with pins and needle sensation.  Denies lightheadedness or dizziness.  Has had associated photophobia.  Denies nausea or vomiting.  Reports some blurry vision earlier today, but denies at current, reports headache slightly eased off from earlier today.  Declines taking medicine.  Headache x1 day.  Has history of headaches and feels slightly similar.  Denies URI symptoms.  Denies fevers.  Reports improvement with Fioricet in the past.  HPI  Past Medical History:  Diagnosis Date  . Anemia   . Anxiety   . Blood transfusion without reported diagnosis   . Chronic back pain   . Depression   . GERD (gastroesophageal reflux disease)   . Headache   . Infection    UTI  . Post partum depression   . Pyelonephritis   . UTI (lower urinary tract infection)     Patient Active Problem List   Diagnosis Date Noted  . Postpartum care following vaginal delivery 8/4 03/24/2020  . Acute intractable headache 03/14/2020  . Vaginal delivery 02/04/2016  . History of postpartum depression, currently pregnant 01/23/2016  . Iron deficiency anemia 01/02/2016  . 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 Trichomoniasis 06/17/2015  . Anxiety 03/28/2014  . Chronic back pain 03/28/2014  . Hx pyelonephritis 2014 05/26/2013    Past Surgical History:  Procedure Laterality Date  . DILATION AND CURETTAGE OF UTERUS    . INDUCED ABORTION    . WISDOM TOOTH EXTRACTION      OB History    Gravida  5   Para  4   Term  4   Preterm      AB  1   Living  4     SAB  0   IAB  1   Ectopic      Multiple  0   Live Births  4             Home Medications    Prior to Admission medications   Medication Sig Start Date End Date Taking? Authorizing Provider  butalbital-acetaminophen-caffeine (FIORICET) 50-325-40 MG tablet Take 1-2 tablets by mouth every 6 (six) hours as needed for headache or migraine. 12/07/20  Yes Afsana Liera C, PA-C  naproxen (NAPROSYN) 500 MG tablet Take 1 tablet (500 mg total) by mouth 2 (two) times daily. 12/07/20  Yes Kiannah Grunow C, PA-C  acetaminophen (TYLENOL) 500 MG tablet Take 2 tablets (1,000 mg total) by mouth every 6 (six) hours as needed. 03/24/20 03/24/21  Neta Mends, CNM  Cetirizine HCl 10 MG CAPS Take 1 capsule (10 mg total) by mouth daily for 10 days. 11/04/20 11/14/20  Israel Werts C, PA-C  triamcinolone (KENALOG) 0.1 % Apply 1 application topically 2 (two) times daily. 11/04/20   Valente Fosberg C, PA-C  pantoprazole (PROTONIX) 20 MG tablet Take 1 tablet (20 mg total) by mouth daily. 09/17/19 11/12/19  Aviva Signs, CNM    Family History Family History  Problem Relation Age of Onset  . Arthritis Mother   . Healthy Father   . Arthritis Maternal Grandmother   .  Asthma Son   . Alcohol abuse Neg Hx     Social History Social History   Tobacco Use  . Smoking status: Former Smoker    Quit date: 01/21/2014    Years since quitting: 6.8  . Smokeless tobacco: Never Used  Vaping Use  . Vaping Use: Former  . Substances: Nicotine, Flavoring  Substance Use Topics  . Alcohol use: Not Currently    Comment: rarely     Allergies   Patient has no known allergies.   Review of Systems Review of Systems  Constitutional: Negative for fatigue and fever.  HENT: Negative for congestion, sinus pressure and sore throat.   Eyes: Positive for photophobia. Negative for pain and visual disturbance.  Respiratory: Negative for cough and shortness of breath.   Cardiovascular: Negative for chest pain.  Gastrointestinal: Negative for abdominal pain, nausea and vomiting.   Genitourinary: Negative for decreased urine volume and hematuria.  Musculoskeletal: Negative for myalgias, neck pain and neck stiffness.  Neurological: Positive for headaches. Negative for dizziness, syncope, facial asymmetry, speech difficulty, weakness, light-headedness and numbness.     Physical Exam Triage Vital Signs ED Triage Vitals [12/07/20 1616]  Enc Vitals Group     BP 121/71     Pulse Rate 85     Resp 18     Temp 98.2 F (36.8 C)     Temp Source Oral     SpO2 98 %     Weight      Height      Head Circumference      Peak Flow      Pain Score 8     Pain Loc      Pain Edu?      Excl. in GC?    No data found.  Updated Vital Signs BP 121/71 (BP Location: Left Arm)   Pulse 85   Temp 98.2 F (36.8 C) (Oral)   Resp 18   SpO2 98%   Visual Acuity Right Eye Distance:   Left Eye Distance:   Bilateral Distance:    Right Eye Near:   Left Eye Near:    Bilateral Near:     Physical Exam Vitals and nursing note reviewed.  Constitutional:      Appearance: She is well-developed.     Comments: No acute distress  HENT:     Head: Normocephalic and atraumatic.     Ears:     Comments: Bilateral ears without tenderness to palpation of external auricle, tragus and mastoid, EAC's without erythema or swelling, TM's with good bony landmarks and cone of light. Non erythematous.     Nose: Nose normal.     Mouth/Throat:     Comments: Oral mucosa pink and moist, no tonsillar enlargement or exudate. Posterior pharynx patent and nonerythematous, no uvula deviation or swelling. Normal phonation. Eyes:     Conjunctiva/sclera: Conjunctivae normal.  Cardiovascular:     Rate and Rhythm: Normal rate.  Pulmonary:     Effort: Pulmonary effort is normal. No respiratory distress.     Comments: Breathing comfortably at rest, CTABL, no wheezing, rales or other adventitious sounds auscultated Abdominal:     General: There is no distension.  Musculoskeletal:        General: Normal  range of motion.     Cervical back: Neck supple.  Skin:    General: Skin is warm and dry.  Neurological:     General: No focal deficit present.     Mental Status: She is alert and  oriented to person, place, and time. Mental status is at baseline.     Cranial Nerves: No cranial nerve deficit.     Motor: No weakness.     Gait: Gait normal.      UC Treatments / Results  Labs (all labs ordered are listed, but only abnormal results are displayed) Labs Reviewed - No data to display  EKG   Radiology No results found.  Procedures Procedures (including critical care time)  Medications Ordered in UC Medications - No data to display  Initial Impression / Assessment and Plan / UC Course  I have reviewed the triage vital signs and the nursing notes.  Pertinent labs & imaging results that were available during my care of the patient were reviewed by me and considered in my medical decision making (see chart for details).     Headache-tension versus migraine-deferring migraine cocktail given symptoms x1 day and has not tried any medicine, will provide a few tablets of Fioricet and will use Naprosyn for more mild to moderate headaches.  No neurodeficits, no red flags, discussed warning signs to follow-up with emergency room for.  Discussed strict return precautions. Patient verbalized understanding and is agreeable with plan.  Final Clinical Impressions(s) / UC Diagnoses   Final diagnoses:  Acute nonintractable headache, unspecified headache type     Discharge Instructions     Please use Naprosyn twice daily for mild to moderate headache Fioricet for severe headaches, use sparingly Drink plenty of water Follow-up in the emergency room if headaches changing worsening, developing vision changes, dizziness or lightheadedness    ED Prescriptions    Medication Sig Dispense Auth. Provider   butalbital-acetaminophen-caffeine (FIORICET) 50-325-40 MG tablet Take 1-2 tablets by  mouth every 6 (six) hours as needed for headache or migraine. 8 tablet Garnetta Fedrick C, PA-C   naproxen (NAPROSYN) 500 MG tablet Take 1 tablet (500 mg total) by mouth 2 (two) times daily. 30 tablet Pattiann Solanki, [place] C, PA-C     I have reviewed the PDMP during this encounter.   Lew Dawes, [place] 12/07/20 1720

## 2020-12-07 NOTE — ED Triage Notes (Signed)
Pt here for generalized HA similar to migraines with hx of same upon waking this am

## 2020-12-07 NOTE — Discharge Instructions (Signed)
Please use Naprosyn twice daily for mild to moderate headache Fioricet for severe headaches, use sparingly Drink plenty of water Follow-up in the emergency room if headaches changing worsening, developing vision changes, dizziness or lightheadedness

## 2020-12-16 ENCOUNTER — Ambulatory Visit: Payer: Self-pay

## 2020-12-19 ENCOUNTER — Inpatient Hospital Stay: Admission: RE | Admit: 2020-12-19 | Payer: Self-pay | Source: Ambulatory Visit

## 2020-12-21 ENCOUNTER — Encounter (HOSPITAL_COMMUNITY): Payer: Self-pay

## 2020-12-21 ENCOUNTER — Ambulatory Visit (HOSPITAL_COMMUNITY)
Admission: RE | Admit: 2020-12-21 | Discharge: 2020-12-21 | Disposition: A | Discharge status: Home / Self Care | Payer: Medicaid Other | Source: Ambulatory Visit | Attending: Family Medicine | Admitting: Family Medicine

## 2020-12-21 ENCOUNTER — Other Ambulatory Visit: Payer: Self-pay

## 2020-12-21 VITALS — BP 117/65 | HR 63 | Resp 18

## 2020-12-21 DIAGNOSIS — Z113 Encounter for screening for infections with a predominantly sexual mode of transmission: Secondary | ICD-10-CM

## 2020-12-21 DIAGNOSIS — Z3202 Encounter for pregnancy test, result negative: Secondary | ICD-10-CM | POA: Diagnosis not present

## 2020-12-21 DIAGNOSIS — A599 Trichomoniasis, unspecified: Secondary | ICD-10-CM | POA: Diagnosis not present

## 2020-12-21 LAB — POCT PREGNANCY, URINE: Preg Test, Ur: NEGATIVE

## 2020-12-21 NOTE — ED Provider Notes (Signed)
  [facility]   [id number] 12/21/20 Arrival Time: 1401  ASSESSMENT & PLAN:  1. Screening for STDs (sexually transmitted diseases)   2. Trichimoniasis    Reports completing treatment prescribed previously. Test of cure sent.  Labs Reviewed  CERVICOVAGINAL ANCILLARY ONLY   UPT: negative.  Will notify of any positive results. Instructed to refrain from sexual activity for at least seven days.  Reviewed expectations re: course of current medical issues. Questions answered. Outlined signs and symptoms indicating need for more acute intervention. Patient verbalized understanding. After Visit Summary given.   SUBJECTIVE:  Melanie Hancock is a 25 y.o. female who reports completing tx for trich approx 4-6 weeks ago; occas scant clear vaginal d/c; desires test of cure. Afebrile. No abd/pelvic pain. Also desires UPT. No LMP recorded. Patient has had an injection. On depo.  OBJECTIVE:  Vitals:   12/21/20 1449  BP: 117/65  Pulse: 63  Resp: 18  SpO2: 100%     General appearance: alert, cooperative, appears stated age and no distress Lungs: unlabored respirations; speaks full sentences without difficulty Back: FROM at waist GU: deferred Skin: warm and dry Psychological: alert and cooperative; normal mood and affect.   No Known Allergies  Past Medical History:  Diagnosis Date  . Anemia   . Anxiety   . Blood transfusion without reported diagnosis   . Chronic back pain   . Depression   . GERD (gastroesophageal reflux disease)   . Headache   . Infection    UTI  . Post partum depression   . Pyelonephritis   . UTI (lower urinary tract infection)    Family History  Problem Relation Age of Onset  . Arthritis Mother   . Healthy Father   . Arthritis Maternal Grandmother   . Asthma Son   . Alcohol abuse Neg Hx    Social History   Socioeconomic History  . Marital status: Single    Spouse name: Chyrel Taha  . Number of children: 3  . Years of education:  Not on file  . Highest education level: Associate degree: occupational, Scientist, product/process development, or vocational program  Occupational History    Employer: [employer]  Tobacco Use  . Smoking status: Former Smoker    Quit date: 01/21/2014    Years since quitting: 6.9  . Smokeless tobacco: Never Used  Vaping Use  . Vaping Use: Former  . Substances: Nicotine, Flavoring  Substance and Sexual Activity  . Alcohol use: Not Currently    Comment: rarely  . Drug use: Not on file    Comment: 2-3 weeks ago  . Sexual activity: Yes    Birth control/protection: Injection  Other Topics Concern  . Not on file  Social History Narrative  . Not on file   Social Determinants of Health   Financial Resource Strain: Not on file  Food Insecurity: Not on file  Transportation Needs: Not on file  Physical Activity: Not on file  Stress: Not on file  Social Connections: Not on file  Intimate Partner Violence: Not on file          Mardella Layman, MD 12/21/20 1510

## 2020-12-21 NOTE — ED Triage Notes (Signed)
Pt in requesting std testing states she was treated for trichomonas but she is still spotting  Pt also requesting pregnancy test

## 2020-12-22 ENCOUNTER — Telehealth (HOSPITAL_COMMUNITY): Payer: Self-pay | Admitting: Emergency Medicine

## 2020-12-22 LAB — CERVICOVAGINAL ANCILLARY ONLY
Bacterial Vaginitis (gardnerella): NEGATIVE
Candida Glabrata: NEGATIVE
Candida Vaginitis: NEGATIVE
Chlamydia: NEGATIVE
Comment: NEGATIVE
Comment: NEGATIVE
Comment: NEGATIVE
Comment: NEGATIVE
Comment: NEGATIVE
Comment: NORMAL
Neisseria Gonorrhea: NEGATIVE
Trichomonas: POSITIVE — AB

## 2020-12-22 MED ORDER — METRONIDAZOLE 500 MG PO TABS: 500.0000 mg | ORAL_TABLET | Freq: Two times a day (BID) | ORAL | 0 refills | Status: DC

## 2021-01-05 ENCOUNTER — Ambulatory Visit
Admission: EM | Admit: 2021-01-05 | Discharge: 2021-01-05 | Disposition: A | Discharge status: Home / Self Care | Payer: Medicaid Other | Attending: Emergency Medicine | Admitting: Emergency Medicine

## 2021-01-05 ENCOUNTER — Encounter: Payer: Self-pay | Admitting: Emergency Medicine

## 2021-01-05 ENCOUNTER — Other Ambulatory Visit: Payer: Self-pay

## 2021-01-05 ENCOUNTER — Ambulatory Visit (HOSPITAL_COMMUNITY): Payer: Self-pay

## 2021-01-05 DIAGNOSIS — N939 Abnormal uterine and vaginal bleeding, unspecified: Secondary | ICD-10-CM | POA: Insufficient documentation

## 2021-01-05 DIAGNOSIS — R059 Cough, unspecified: Secondary | ICD-10-CM | POA: Insufficient documentation

## 2021-01-05 LAB — POCT URINALYSIS DIP (MANUAL ENTRY)
Bilirubin, UA: NEGATIVE
Blood, UA: NEGATIVE
Glucose, UA: NEGATIVE mg/dL
Ketones, POC UA: NEGATIVE mg/dL
Leukocytes, UA: NEGATIVE
Nitrite, UA: NEGATIVE
Protein Ur, POC: NEGATIVE mg/dL
Spec Grav, UA: >=1.03 — AB (ref 1.010–1.025)
Urobilinogen, UA: 0.2 E.U./dL
pH, UA: 7.5 (ref 5.0–8.0)

## 2021-01-05 LAB — POCT URINE PREGNANCY: Preg Test, Ur: NEGATIVE

## 2021-01-05 NOTE — ED Triage Notes (Signed)
Patient c/o nonproductive cough x 3 weeks.   Patient denies fever, SOB or Chest Pain.   Patient endorses using a vape daily.   Patient hasn't taken any medications for cough.   Patient c/o vaginal bleeding x 1 month.   Patient endorses spotting that has occurred after trichomonas diagnosis at another [facility].   Patient endorses an episode " of a big gush of blood" while at home.   Patient endorses fatigue.   Patient denies abnormal vaginal discharge.   Patient endorses " dark urine and increased pressure".   Patient has used Boric Acid for " BV prevention".   Patient endorses LMP November 2020. Patient is using depo for contraception.

## 2021-01-05 NOTE — Discharge Instructions (Addendum)
Your urine pregnancy test is negative.  Your urine does not show signs of infection.     Your vaginal tests are pending.  If your test results are positive, we will call you.  You and your sexual partner(s) may require treatment at that time.  Do not have sexual activity for at least 7 days.    Schedule an appointment with a gynecologist such as the one listed below.    Take over-the-counter Robitussin as needed for cough. Follow up with your primary care provider if your symptoms are not improving.

## 2021-01-05 NOTE — ED Provider Notes (Signed)
[name]    CSN: [id number] Arrival date & time: 01/05/21  1420      History   Chief Complaint Chief Complaint  Patient presents with  . Cough  . Vaginal Bleeding    HPI Melanie Hancock is a 25 y.o. female.   Patient presents with 3-week history of mild intermittent nonproductive cough.  She also reports vaginal "spotting" which is "very light" x1 month.  She also reports urine is dark.  She denies fever, chills, shortness of breath, abdominal pain, pelvic pain, flank pain, or other symptoms.  Patient was seen at [facility] urgent care on 12/21/2020; diagnosed with screening for STDs and trichomoniasis; treated with metronidazole.  Her medical history includes chronic back pain, anemia, HSV, trichomoniasis, anxiety, depression.  The history is provided by the patient and medical records.    Past Medical History:  Diagnosis Date  . Anemia   . Anxiety   . Blood transfusion without reported diagnosis   . Chronic back pain   . Depression   . GERD (gastroesophageal reflux disease)   . Headache   . Infection    UTI  . Post partum depression   . Pyelonephritis   . UTI (lower urinary tract infection)     Patient Active Problem List   Diagnosis Date Noted  . Postpartum care following vaginal delivery 8/4 03/24/2020  . Acute intractable headache 03/14/2020  . Vaginal delivery 02/04/2016  . History of postpartum depression, currently pregnant 01/23/2016  . Iron deficiency anemia 01/02/2016  . 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 Trichomoniasis 06/17/2015  . Anxiety 03/28/2014  . Chronic back pain 03/28/2014  . Hx pyelonephritis 2014 05/26/2013    Past Surgical History:  Procedure Laterality Date  . DILATION AND CURETTAGE OF UTERUS    . INDUCED ABORTION    . WISDOM TOOTH EXTRACTION      OB History    Gravida  5   Para  4   Term  4   Preterm      AB  1   Living  4     SAB  0   IAB  1   Ectopic      Multiple  0   Live  Births  4            Home Medications    Prior to Admission medications   Medication Sig Start Date End Date Taking? Authorizing Provider  metroNIDAZOLE (FLAGYL) 500 MG tablet Take 1 tablet (500 mg total) by mouth 2 (two) times daily. 12/22/20  Yes Lamptey, Britta Mccreedy, MD  acetaminophen (TYLENOL) 500 MG tablet Take 2 tablets (1,000 mg total) by mouth every 6 (six) hours as needed. 03/24/20 03/24/21  Neta Mends, CNM  butalbital-acetaminophen-caffeine (FIORICET) [phone] MG tablet Take 1-2 tablets by mouth every 6 (six) hours as needed for headache or migraine. 12/07/20   Wieters, Hallie C, PA-C  Cetirizine HCl 10 MG CAPS Take 1 capsule (10 mg total) by mouth daily for 10 days. 11/04/20 11/14/20  Wieters, Hallie C, PA-C  naproxen (NAPROSYN) 500 MG tablet Take 1 tablet (500 mg total) by mouth 2 (two) times daily. 12/07/20   Wieters, Hallie C, PA-C  triamcinolone (KENALOG) 0.1 % Apply 1 application topically 2 (two) times daily. 11/04/20   Wieters, Hallie C, PA-C  pantoprazole (PROTONIX) 20 MG tablet Take 1 tablet (20 mg total) by mouth daily. 09/17/19 11/12/19  Aviva Signs, CNM    Family History Family History  Problem Relation Age of Onset  . Arthritis Mother   . Healthy Father   . Arthritis Maternal Grandmother   . Asthma Son   . Alcohol abuse Neg Hx     Social History Social History   Tobacco Use  . Smoking status: Former Smoker    Quit date: 01/21/2014    Years since quitting: 6.9  . Smokeless tobacco: Never Used  Vaping Use  . Vaping Use: Former  . Substances: Nicotine, Flavoring  Substance Use Topics  . Alcohol use: Not Currently    Comment: rarely     Allergies   Patient has no known allergies.   Review of Systems Review of Systems  Constitutional: Negative for chills and fever.  HENT: Negative for ear pain and sore throat.   Respiratory: Positive for cough. Negative for shortness of breath.   Cardiovascular: Negative for chest pain and palpitations.   Gastrointestinal: Negative for abdominal pain, diarrhea and vomiting.  Genitourinary: Positive for vaginal bleeding. Negative for dysuria, flank pain, hematuria and pelvic pain.  Skin: Negative for color change and rash.  All other systems reviewed and are negative.    Physical Exam Triage Vital Signs ED Triage Vitals [01/05/21 1432]  Enc Vitals Group     BP      Pulse      Resp      Temp      Temp src      SpO2      Weight      Height      Head Circumference      Peak Flow      Pain Score 6     Pain Loc      Pain Edu?      Excl. in GC?    No data found.  Updated Vital Signs BP 114/76 (BP Location: Left Arm)   Pulse (!) 101   Temp 99.3 F (37.4 C) (Oral)   Resp 13   LMP  (LMP Unknown)   SpO2 98%   Visual Acuity Right Eye Distance:   Left Eye Distance:   Bilateral Distance:    Right Eye Near:   Left Eye Near:    Bilateral Near:     Physical Exam Vitals and nursing note reviewed.  Constitutional:      General: She is not in acute distress.    Appearance: She is well-developed. She is not ill-appearing.  HENT:     Head: Normocephalic and atraumatic.     Right Ear: Tympanic membrane normal.     Left Ear: Tympanic membrane normal.     Nose: Nose normal.     Mouth/Throat:     Mouth: Mucous membranes are moist.     Pharynx: Oropharynx is clear.  Eyes:     Conjunctiva/sclera: Conjunctivae normal.  Cardiovascular:     Rate and Rhythm: Normal rate and regular rhythm.     Heart sounds: Normal heart sounds.  Pulmonary:     Effort: Pulmonary effort is normal. No respiratory distress.     Breath sounds: Normal breath sounds.  Abdominal:     General: Bowel sounds are normal.     Palpations: Abdomen is soft.     Tenderness: There is no abdominal tenderness. There is no right CVA tenderness, left CVA tenderness, guarding or rebound.  Musculoskeletal:     Cervical back: Neck supple.  Skin:    General: Skin is warm and dry.  Neurological:     General: No  focal deficit present.  Mental Status: She is alert and oriented to person, place, and time.     Gait: Gait normal.  Psychiatric:        Mood and Affect: Mood normal.        Behavior: Behavior normal.      UC Treatments / Results  Labs (all labs ordered are listed, but only abnormal results are displayed) Labs Reviewed  POCT URINALYSIS DIP (MANUAL ENTRY) - Abnormal; Notable for the following components:      Result Value   Spec Grav, UA >=1.030 (*)    All other components within normal limits  POCT URINE PREGNANCY  CERVICOVAGINAL ANCILLARY ONLY    EKG   Radiology No results found.  Procedures Procedures (including critical care time)  Medications Ordered in UC Medications - No data to display  Initial Impression / Assessment and Plan / UC Course  I have reviewed the triage vital signs and the nursing notes.  Pertinent labs & imaging results that were available during my care of the patient were reviewed by me and considered in my medical decision making (see chart for details).   Cough. Vaginal bleeding.  Urine pregnancy negative.  Urine does not show signs of infection.  Instructed patient to increase her water intake.  Patient obtained vaginal self swab for testing.  Instructed her to abstain from sexual activity for at least 7 days.  Instructed her to follow-up with her PCP or OB/GYN if her symptoms are not improving.  Discussed over-the-counter treatment for her cough.  Patient agrees to plan of care.      Final Clinical Impressions(s) / UC Diagnoses   Final diagnoses:  Cough  Vaginal bleeding     Discharge Instructions     Your urine pregnancy test is negative.  Your urine does not show signs of infection.     Your vaginal tests are pending.  If your test results are positive, we will call you.  You and your sexual partner(s) may require treatment at that time.  Do not have sexual activity for at least 7 days.    Schedule an appointment with a  gynecologist such as the one listed below.    Take over-the-counter Robitussin as needed for cough. Follow up with your primary care provider if your symptoms are not improving.         ED Prescriptions    None     PDMP not reviewed this encounter.   Mickie Bail, NP 01/05/21 [phone]

## 2021-01-09 LAB — CERVICOVAGINAL ANCILLARY ONLY
Bacterial Vaginitis (gardnerella): NEGATIVE
Candida Glabrata: NEGATIVE
Candida Vaginitis: NEGATIVE
Chlamydia: NEGATIVE
Comment: NEGATIVE
Comment: NEGATIVE
Comment: NEGATIVE
Comment: NEGATIVE
Comment: NEGATIVE
Comment: NORMAL
Neisseria Gonorrhea: NEGATIVE
Trichomonas: NEGATIVE

## 2021-02-08 ENCOUNTER — Ambulatory Visit (HOSPITAL_COMMUNITY): Payer: Medicaid Other

## 2021-02-28 ENCOUNTER — Ambulatory Visit
Admission: RE | Admit: 2021-02-28 | Discharge: 2021-02-28 | Disposition: A | Discharge status: Home / Self Care | Payer: Medicaid Other | Source: Ambulatory Visit | Attending: Emergency Medicine | Admitting: Emergency Medicine

## 2021-02-28 ENCOUNTER — Other Ambulatory Visit: Payer: Self-pay

## 2021-02-28 VITALS — BP 111/71 | HR 72 | Temp 98.7°F | Resp 18

## 2021-02-28 DIAGNOSIS — N939 Abnormal uterine and vaginal bleeding, unspecified: Secondary | ICD-10-CM

## 2021-02-28 LAB — POCT URINE PREGNANCY: Preg Test, Ur: NEGATIVE

## 2021-02-28 MED ORDER — NAPROXEN 500 MG PO TABS: 500.0000 mg | ORAL_TABLET | Freq: Two times a day (BID) | ORAL | 0 refills | Status: DC

## 2021-02-28 NOTE — ED Provider Notes (Signed)
[facility]    CSN: [id number] Arrival date & time: 02/28/21  1459      History   Chief Complaint Chief Complaint  Patient presents with   Vaginal Bleeding    HPI Melanie Hancock is a 25 y.o. female presenting today for evaluation of vaginal bleeding.  Patient reports that she has had intermittent spotting and bleeding over the past 3 months.  Reports that she is due for her Depo injection in early April, but this was delayed until late April.  Since she has had spotting which overall has been light, but recently over the past week has noted that it is slightly heavier needing to use a pad and has had associated lower abdominal pain/cramping.  Has been seen by OB/GYN, had negative STD screening.  Has plans to have transvaginal ultrasound coming up at the end of this month.  She reports that she was prescribed birth control pills by her OB/GYN to help with bleeding but she has not started these yet.  Using ibuprofen without relief of cramping.  Denies other urinary symptoms.  Denies any effect on appetite, oral intake at baseline without affecting pain.  Bowels normal.  HPI  Past Medical History:  Diagnosis Date   Anemia    Anxiety    Blood transfusion without reported diagnosis    Chronic back pain    Depression    GERD (gastroesophageal reflux disease)    Headache    Infection    UTI   Post partum depression    Pyelonephritis    UTI (lower urinary tract infection)     Patient Active Problem List   Diagnosis Date Noted   Postpartum care following vaginal delivery 8/4 03/24/2020   Acute intractable headache 03/14/2020   Vaginal delivery 02/04/2016   History of postpartum depression, currently pregnant 01/23/2016   Iron deficiency anemia 01/02/2016   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   Herpes simplex 11/06/2015   Trichomoniasis 06/17/2015   Anxiety 03/28/2014   Chronic back pain 03/28/2014   Hx pyelonephritis 2014 05/26/2013    Past Surgical History:  Procedure  Laterality Date   DILATION AND CURETTAGE OF UTERUS     INDUCED ABORTION     WISDOM TOOTH EXTRACTION      OB History     Gravida  5   Para  4   Term  4   Preterm      AB  1   Living  4      SAB  0   IAB  1   Ectopic      Multiple  0   Live Births  4            Home Medications    Prior to Admission medications   Medication Sig Start Date End Date Taking? Authorizing Provider  acetaminophen (TYLENOL) 500 MG tablet Take 2 tablets (1,000 mg total) by mouth every 6 (six) hours as needed. 03/24/20 03/24/21 Yes Neta Mends, CNM  albuterol (VENTOLIN HFA) 108 (90 Base) MCG/ACT inhaler Inhale into the lungs every 6 (six) hours as needed for wheezing or shortness of breath.   Yes [provider]  butalbital-acetaminophen-caffeine (FIORICET) 50-325-40 MG tablet Take 1-2 tablets by mouth every 6 (six) hours as needed for headache or migraine. 12/07/20  Yes Amro Winebarger C, PA-C  Cetirizine HCl 10 MG CAPS Take 1 capsule (10 mg total) by mouth daily for 10 days. 11/04/20 02/28/21 Yes Adrienna Karis C, PA-C  naproxen (NAPROSYN) 500 MG  tablet Take 1 tablet (500 mg total) by mouth 2 (two) times daily. 02/28/21  Yes Glyndon Tursi C, PA-C  pantoprazole (PROTONIX) 20 MG tablet Take 1 tablet (20 mg total) by mouth daily. 09/17/19 11/12/19  Aviva Signs, CNM    Family History Family History  Problem Relation Age of Onset   Arthritis Mother    Healthy Father    Arthritis Maternal Grandmother    Asthma Son    Alcohol abuse Neg Hx     Social History Social History   Tobacco Use   Smoking status: Former    Pack years: 0.00    Types: Cigarettes    Quit date: 01/21/2014    Years since quitting: 7.1   Smokeless tobacco: Never  Vaping Use   Vaping Use: Former   Substances: Nicotine, Flavoring  Substance Use Topics   Alcohol use: Not Currently    Comment: rarely     Allergies   Patient has no known allergies.   Review of Systems Review of Systems   Constitutional:  Negative for fever.  Respiratory:  Negative for shortness of breath.   Cardiovascular:  Negative for chest pain.  Gastrointestinal:  Positive for abdominal pain. Negative for diarrhea, nausea and vomiting.  Genitourinary:  Positive for vaginal bleeding. Negative for dysuria, flank pain, genital sores, hematuria, menstrual problem, vaginal discharge and vaginal pain.  Musculoskeletal:  Negative for back pain.  Skin:  Negative for rash.  Neurological:  Negative for dizziness, light-headedness and headaches.    Physical Exam Triage Vital Signs ED Triage Vitals  Enc Vitals Group     BP      Pulse      Resp      Temp      Temp src      SpO2      Weight      Height      Head Circumference      Peak Flow      Pain Score      Pain Loc      Pain Edu?      Excl. in GC?    No data found.  Updated Vital Signs BP 111/71 (BP Location: Right Arm)   Pulse 72   Temp 98.7 F (37.1 C) (Oral)   Resp 18   SpO2 97%   Breastfeeding No   Visual Acuity Right Eye Distance:   Left Eye Distance:   Bilateral Distance:    Right Eye Near:   Left Eye Near:    Bilateral Near:     Physical Exam Vitals and nursing note reviewed.  Constitutional:      Appearance: She is well-developed.     Comments: No acute distress  HENT:     Head: Normocephalic and atraumatic.     Ears:     Comments:      Nose: Nose normal.  Eyes:     Conjunctiva/sclera: Conjunctivae normal.  Cardiovascular:     Rate and Rhythm: Normal rate and regular rhythm.  Pulmonary:     Effort: Pulmonary effort is normal. No respiratory distress.     Comments: Breathing comfortably at rest, CTABL, no wheezing, rales or other adventitious sounds auscultated  Abdominal:     General: There is no distension.     Comments: Abdomen soft, nondistended, tender to palpation to bilateral lower quadrants and suprapubic area, negative rebound, negative Rovsing, negative Burney's  Musculoskeletal:        General:  Normal range of motion.  Cervical back: Neck supple.  Skin:    General: Skin is warm and dry.  Neurological:     Mental Status: She is alert and oriented to person, place, and time.     UC Treatments / Results  Labs (all labs ordered are listed, but only abnormal results are displayed) Labs Reviewed  POCT URINE PREGNANCY  CERVICOVAGINAL ANCILLARY ONLY    EKG   Radiology No results found.  Procedures Procedures (including critical care time)  Medications Ordered in UC Medications - No data to display  Initial Impression / Assessment and Plan / UC Course  I have reviewed the triage vital signs and the nursing notes.  Pertinent labs & imaging results that were available during my care of the patient were reviewed by me and considered in my medical decision making (see chart for details).    Abnormal uterine bleeding- 25 year old female with 3 months of vaginal bleeding; has already been screened by OB/GYN for STDs, suspect likely increase in abdominal pain related to increase in bleeding/flow.  Provided Naprosyn as alternative to ibuprofen to use for pain, warm compresses abdomen, encourage patient to pick up birth control tablets to help with bleeding and continue to follow-up with OB/GYN/ultrasound for further evaluation of cause of persistent bleeding/spotting.  Discussed strict return precautions. Patient verbalized understanding and is agreeable with plan.  Final Clinical Impressions(s) / UC Diagnoses   Final diagnoses:  Vaginal bleeding     Discharge Instructions      Please pick up prescription provided by OB/GYN to help with bleeding Naprosyn twice daily with food for cramping Warm compresses Continue to plan to go to ultrasound for further evaluation of bleeding If developing worsening abdominal pain-go to emergency room     ED Prescriptions     Medication Sig Dispense Auth. Provider   naproxen (NAPROSYN) 500 MG tablet Take 1 tablet (500 mg  total) by mouth 2 (two) times daily. 30 tablet [place] Lehnen, Strang C, PA-C      PDMP not reviewed this encounter.   Lew Dawes, [place] 03/01/21 [phone]

## 2021-02-28 NOTE — Discharge Instructions (Addendum)
Please pick up prescription provided by OB/GYN to help with bleeding Naprosyn twice daily with food for cramping Warm compresses Continue to plan to go to ultrasound for further evaluation of bleeding If developing worsening abdominal pain-go to emergency room

## 2021-02-28 NOTE — ED Triage Notes (Signed)
Pt presents for vaginal spotting x 3 months.  Amount is enough for liner and sometimes pad.  Spotting is worse after intercourse or drinking alcohol.  Started cramping pains last week then saw OBGYN who told her that it could be related to Depo.  They did STD check and scheduled pt for [place] to check for ovarian cyst which is scheduled for end of July.  States this morning the lower abd pain increased and felt like stabbing in RLQ.   Depo was due at end of March but she missed that dose and had next dose end of April.

## 2021-03-03 ENCOUNTER — Encounter: Payer: Self-pay | Admitting: Emergency Medicine

## 2021-03-03 ENCOUNTER — Ambulatory Visit: Payer: Self-pay

## 2021-03-03 ENCOUNTER — Ambulatory Visit
Admission: EM | Admit: 2021-03-03 | Discharge: 2021-03-03 | Disposition: A | Discharge status: Home / Self Care | Payer: Medicaid Other | Attending: Emergency Medicine | Admitting: Emergency Medicine

## 2021-03-03 ENCOUNTER — Other Ambulatory Visit: Payer: Self-pay

## 2021-03-03 DIAGNOSIS — H6982 Other specified disorders of Eustachian tube, left ear: Secondary | ICD-10-CM

## 2021-03-03 MED ORDER — AZELASTINE HCL 0.1 % NA SOLN: 1.0000 | Freq: Two times a day (BID) | NASAL | 0 refills | Status: DC

## 2021-03-03 MED ORDER — PREDNISONE 20 MG PO TABS: 40.0000 mg | ORAL_TABLET | Freq: Every day | ORAL | 0 refills | Status: AC

## 2021-03-03 MED ORDER — CETIRIZINE HCL 10 MG PO CAPS: 10.0000 mg | ORAL_CAPSULE | Freq: Every day | ORAL | 0 refills | Status: DC

## 2021-03-03 NOTE — ED Provider Notes (Signed)
[facility]    CSN: [id number] Arrival date & time: 03/03/21  1448      History   Chief Complaint Chief Complaint  Patient presents with   Ear Pain    HPI Melanie Hancock is a 25 y.o. female presenting today for evaluation of possible ear infection.  Reports 5 days of ear fullness and pressure, sensation of fluid on ear.  At nighttime having a more sharp pain and discomfort.  Denies recent URI symptoms, but does report typically does have allergies.  Has not been using allergy meds recently.  Denies fevers.  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 Authorizing Provider  azelastine (ASTELIN) 0.1 % nasal spray Place 1-2 sprays into both nostrils 2 (two) times daily. Use in each nostril as directed 03/03/21  Yes Jakiah Goree,  Vernell Townley C, PA-C  Cetirizine HCl 10 MG CAPS Take 1 capsule (10 mg total) by mouth daily for 10 days. 03/03/21 03/13/21 Yes Sophiah Rolin C, PA-C  predniSONE (DELTASONE) 20 MG tablet Take 2 tablets (40 mg total) by mouth daily with breakfast for 5 days. 03/03/21 03/08/21 Yes Shon Indelicato C, PA-C  acetaminophen (TYLENOL) 500 MG tablet Take 2 tablets (1,000 mg total) by mouth every 6 (six) hours as needed. 03/24/20 03/24/21  Neta Mends, CNM  albuterol (VENTOLIN HFA) 108 (90 Base) MCG/ACT inhaler Inhale into the lungs every 6 (six) hours as needed for wheezing or shortness of breath.    [provider]  butalbital-acetaminophen-caffeine (FIORICET) 50-325-40 MG tablet Take 1-2 tablets by mouth every 6 (six) hours as needed for headache or migraine. 12/07/20   Kimberlyn Quiocho C, PA-C  naproxen (NAPROSYN) 500 MG tablet Take 1 tablet (500 mg total) by mouth 2 (two) times daily. 02/28/21   Dalia Jollie C, PA-C  pantoprazole (PROTONIX) 20 MG tablet Take 1 tablet (20 mg total) by mouth daily. 09/17/19 11/12/19  Aviva Signs, CNM  Family History Family History  Problem Relation Age of Onset   Arthritis Mother    Healthy Father    Arthritis Maternal Grandmother    Asthma Son    Alcohol abuse Neg Hx     Social History Social History   Tobacco Use   Smoking status: Former    Types: Cigarettes    Quit date: 01/21/2014    Years since quitting: 7.1   Smokeless tobacco: Never  Vaping Use   Vaping Use: Former   Substances: Nicotine, Flavoring  Substance Use Topics   Alcohol use: Not Currently    Comment: rarely     Allergies   Patient has no known allergies.   Review of Systems Review of Systems  Constitutional:  Negative for activity change, appetite change, chills, fatigue and fever.  HENT:  Positive for ear pain. Negative for congestion, rhinorrhea, sinus pressure, sore throat and trouble swallowing.   Eyes:  Negative for discharge and redness.  Respiratory:  Negative for  cough, chest tightness and shortness of breath.   Cardiovascular:  Negative for chest pain.  Gastrointestinal:  Negative for abdominal pain, diarrhea, nausea and vomiting.  Musculoskeletal:  Negative for myalgias.  Skin:  Negative for rash.  Neurological:  Negative for dizziness, light-headedness and headaches.    Physical Exam Triage Vital Signs ED Triage Vitals  Enc Vitals Group     BP      Pulse      Resp      Temp      Temp src      SpO2      Weight      Height      Head Circumference      Peak Flow      Pain Score      Pain Loc      Pain Edu?      Excl. in GC?    No data found.  Updated Vital Signs BP 108/72 (BP Location: Right Arm)   Pulse 84   Temp 99.3 F (37.4 C) (Oral)   Resp 18   SpO2 98%   Visual Acuity Right Eye Distance:   Left Eye Distance:   Bilateral Distance:    Right Eye Near:   Left Eye Near:    Bilateral Near:     Physical Exam Vitals and nursing note reviewed.  Constitutional:      Appearance: She is well-developed.     Comments: No acute distress  HENT:     Head: Normocephalic and atraumatic.     Ears:     Comments: Clear effusion present on left TM, good bony landmarks and cone of light, no erythema    Nose: Nose normal.     Mouth/Throat:     Comments: Oral mucosa pink and moist, no tonsillar enlargement or exudate. Posterior pharynx patent and nonerythematous, no uvula deviation or swelling. Normal phonation.  Eyes:     Conjunctiva/sclera: Conjunctivae normal.  Cardiovascular:     Rate and Rhythm: Normal rate.  Pulmonary:     Effort: Pulmonary effort is normal. No respiratory distress.     Comments: Breathing comfortably at rest, CTABL, no wheezing, rales or other adventitious sounds auscultated  Abdominal:     General: There is no distension.  Musculoskeletal:        General: Normal range of motion.     Cervical back: Neck supple.  Skin:    General: Skin is warm and dry.  Neurological:     Mental Status: She  is alert  and oriented to person, place, and time.     UC Treatments / Results  Labs (all labs ordered are listed, but only abnormal results are displayed) Labs Reviewed - No data to display  EKG   Radiology No results found.  Procedures Procedures (including critical care time)  Medications Ordered in UC Medications - No data to display  Initial Impression / Assessment and Plan / UC Course  I have reviewed the triage vital signs and the nursing notes.  Pertinent labs & imaging results that were available during my care of the patient were reviewed by me and considered in my medical decision making (see chart for details).     Left eustachian tube dysfunction-recommending initiating daily antihistamines and nasal sprays and uses inconsistently over the next 1 to 2 weeks.  If still not having improvement in symptoms to trial oral course of prednisone.  Discussed strict return precautions. Patient verbalized understanding and is agreeable with plan.  Final Clinical Impressions(s) / UC Diagnoses   Final diagnoses:  Dysfunction of left eustachian tube     Discharge Instructions      Please restart daily cetirizine/Zyrtec and using nasal sprays of Flonase or Astelin Uses consistently over the next 1 to 2 weeks If not seeing any improvement may fill prescription for oral prednisone to further help with underlying inflammation contributing to fluid on ears If developing worsening plan follow-up for recheck     ED Prescriptions     Medication Sig Dispense Auth. Provider   Cetirizine HCl 10 MG CAPS Take 1 capsule (10 mg total) by mouth daily for 10 days. 30 capsule Linford Quintela C, PA-C   predniSONE (DELTASONE) 20 MG tablet Take 2 tablets (40 mg total) by mouth daily with breakfast for 5 days. 10 tablet Josiyah Tozzi C, PA-C   azelastine (ASTELIN) 0.1 % nasal spray Place 1-2 sprays into both nostrils 2 (two) times daily. Use in each nostril as directed 30 mL Wilena Tyndall C,  PA-C      PDMP not reviewed this encounter.   Lew Dawes, PA-C 03/03/21 1512

## 2021-03-03 NOTE — Discharge Instructions (Addendum)
Please restart daily cetirizine/Zyrtec and using nasal sprays of Flonase or Astelin Uses consistently over the next 1 to 2 weeks If not seeing any improvement may fill prescription for oral prednisone to further help with underlying inflammation contributing to fluid on ears If developing worsening plan follow-up for recheck

## 2021-03-03 NOTE — ED Triage Notes (Signed)
Patient presents to [facility] a clogged sensation to her left ear since Monday with a popping and painful sensation at night for the past two days.

## 2021-05-15 ENCOUNTER — Other Ambulatory Visit: Payer: Self-pay

## 2021-05-15 ENCOUNTER — Ambulatory Visit
Admission: EM | Admit: 2021-05-15 | Discharge: 2021-05-15 | Disposition: A | Discharge status: Home / Self Care | Payer: Medicaid Other | Attending: Emergency Medicine | Admitting: Emergency Medicine

## 2021-05-15 ENCOUNTER — Encounter: Payer: Self-pay | Admitting: Emergency Medicine

## 2021-05-15 DIAGNOSIS — N390 Urinary tract infection, site not specified: Secondary | ICD-10-CM | POA: Diagnosis present

## 2021-05-15 LAB — POCT URINALYSIS DIP (MANUAL ENTRY)
Bilirubin, UA: NEGATIVE
Glucose, UA: NEGATIVE mg/dL
Ketones, POC UA: NEGATIVE mg/dL
Nitrite, UA: NEGATIVE
Protein Ur, POC: 100 mg/dL — AB
Spec Grav, UA: 1.025 (ref 1.010–1.025)
Urobilinogen, UA: 0.2 E.U./dL
pH, UA: 7 (ref 5.0–8.0)

## 2021-05-15 LAB — POCT URINE PREGNANCY: Preg Test, Ur: NEGATIVE

## 2021-05-15 MED ORDER — CEPHALEXIN 500 MG PO CAPS: 500.0000 mg | ORAL_CAPSULE | Freq: Two times a day (BID) | ORAL | 0 refills | Status: AC

## 2021-05-15 NOTE — Discharge Instructions (Signed)
Your testing has been sent to the lab.  If your testing results as positive, you will be notified via phone and we will initiate treatment at that time.  If you do not receive a phone call from [place] within the next 2 to 3 days, check your MyChart for updated health information. Do not engage in sex until you know your results. If you test positive you will need to abstain from sex for 7 days and notify all partners.   Your pregnancy test is negative today.  You were seen today for an infection in the lower urinary tract. Your urine sample today was sent for a culture. The urine culture will show what type of bacteria grows and if you are on the appropriate antibiotic. If the antibiotic needs to be changed, you will receive a phone call from the follow up nurse who will give you more information. If you do not receive a call, then you are on the correct antibiotic.  Take antibiotics as directed. Finish course even if feeling better sooner. Drink plenty of clear fluids. Over the counter "Uristat" or "Azo Standard" may help your discomfort.  This will turn your urine dark orange or red and may stain contacts (remove before using). You may experience 24 - 48 hours of continuing discomfort until medication controls the infection.  Return to clinic or go to the ER if you develop a fever, one-sided back pain, or vomiting as these are signs of a worsening infection.

## 2021-05-15 NOTE — ED Provider Notes (Signed)
SUBJECTIVE:  Melanie Hancock is a very pleasant 25 y.o. Female presents with concern for STD, UTI due to 3 weeks of nausea, dysuria, increase in appetite. Patient would also like pregnancy testing.  No unilateral back pain, fever or vomiting reported.  ROS: General/Constitutional: No fever, chills, or sweats GI: No abdominal pain, nausea/vomiting or diarrhea GU: No urinary frequency or dysuria Genitalia: As above Lymph: No swelling, red streaks or swollen lymph nodes Skin: No rashes or skin lesions   OBJECTIVE: Vitals:   05/15/21 1055  BP: (!) 120/56  Pulse: 76  Resp: 20  Temp: 98.7 F (37.1 C)  SpO2: 100%     General: Appears well-developed and well-nourished. No acute distress.  Cardiovascular: Normal rate  Pulm/Chest: No respiratory distress Abdominal: No CVAT.  Neurological: Alert and oriented to person, place, and time.  Skin: Skin is warm and dry.  Psychiatric: Normal mood, affect, behavior, and thought content.  GU:  Deferred secondary to self collect specimen   Laboratory:  Orders Placed This Encounter  Procedures   Urine Culture   POCT urinalysis dipstick   POCT urine pregnancy   Results for orders placed or performed during the hospital encounter of 05/15/21  POCT urinalysis dipstick  Result Value Ref Range   Color, UA yellow yellow   Clarity, UA cloudy (A) clear   Glucose, UA negative negative mg/dL   Bilirubin, UA negative negative   Ketones, POC UA negative negative mg/dL   Spec Grav, UA 4.196 2.229 - 1.025   Blood, UA large (A) negative   pH, UA 7.0 5.0 - 8.0   Protein Ur, POC =100 (A) negative mg/dL   Urobilinogen, UA 0.2 0.2 or 1.0 E.U./dL   Nitrite, UA Negative Negative   Leukocytes, UA Moderate (2+) (A) Negative  POCT urine pregnancy  Result Value Ref Range   Preg Test, Ur Negative Negative     Assessment: 1. Acute UTI - Cervicovaginal ancillary only; Standing - POCT urinalysis dipstick; Standing - POCT urine pregnancy; Standing -  Cervicovaginal ancillary only - POCT urinalysis dipstick - POCT urine pregnancy - Urine Culture; Standing - Urine Culture - cephALEXin (KEFLEX) 500 MG capsule; Take 1 capsule (500 mg total) by mouth 2 (two) times daily for 7 days.  Dispense: 14 capsule; Refill: 0  Plan:  If new medications were prescribed and/or administered during this visit Instructions about new medications and side effects provided.  If any changes in chronic medications then new reconciled medication list is given to patient.    MDM: Patient presents with concern for STD, UTI due to 3 weeks of nausea, dysuria, increase in appetite. Patient would also like pregnancy testing.  No unilateral back pain, fever or vomiting reported.  Pregnancy negative.  APTIMA swab obtained.  Urine culture pending.  Urinalysis: Cloudy urine, large blood, 100 protein, moderate leukocytes.  Given symptoms and urinalysis results, likely acute UTI.  Rx'd Keflex to the patient's preferred pharmacy and advised about home treatment and care to include increased fluids, rest, AZO/Uristat for discomfort.  Return as needed for worsening symptoms to include fever, unilateral back pain or vomiting.  Stable on discharge.  Patient verbalized understanding and agreed with plan.  Instructions:    Discharge Instructions      Your testing has been sent to the lab.  If your testing results as positive, you will be notified via phone and we will initiate treatment at that time.  If you do not receive a phone call from [place] within the next 2  to 3 days, check your MyChart for updated health information. Do not engage in sex until you know your results. If you test positive you will need to abstain from sex for 7 days and notify all partners.   Your pregnancy test is negative today.  You were seen today for an infection in the lower urinary tract. Your urine sample today was sent for a culture. The urine culture will show what type of bacteria grows and if you are on  the appropriate antibiotic. If the antibiotic needs to be changed, you will receive a phone call from the follow up nurse who will give you more information. If you do not receive a call, then you are on the correct antibiotic.  Take antibiotics as directed. Finish course even if feeling better sooner. Drink plenty of clear fluids. Over the counter "Uristat" or "Azo Standard" may help your discomfort.  This will turn your urine dark orange or red and may stain contacts (remove before using). You may experience 24 - 48 hours of continuing discomfort until medication controls the infection.  Return to clinic or go to the ER if you develop a fever, one-sided back pain, or vomiting as these are signs of a worsening infection.          Amalia Greenhouse, FNP 05/15/21 1125

## 2021-05-15 NOTE — ED Triage Notes (Signed)
Pt here with 3 weeks nausea, dysuria, increase in appetite. Would like STI, UA, and pregnancy.

## 2021-05-16 ENCOUNTER — Ambulatory Visit (HOSPITAL_COMMUNITY): Payer: Medicaid Other

## 2021-05-16 LAB — CERVICOVAGINAL ANCILLARY ONLY
Bacterial Vaginitis (gardnerella): POSITIVE — AB
Candida Glabrata: NEGATIVE
Candida Vaginitis: NEGATIVE
Chlamydia: NEGATIVE
Comment: NEGATIVE
Comment: NEGATIVE
Comment: NEGATIVE
Comment: NEGATIVE
Comment: NEGATIVE
Comment: NORMAL
Neisseria Gonorrhea: NEGATIVE
Trichomonas: POSITIVE — AB

## 2021-05-16 LAB — URINE CULTURE: Special Requests: NORMAL

## 2021-05-17 ENCOUNTER — Telehealth (HOSPITAL_COMMUNITY): Payer: Self-pay | Admitting: Emergency Medicine

## 2021-05-17 MED ORDER — METRONIDAZOLE 500 MG PO TABS: 500.0000 mg | ORAL_TABLET | Freq: Two times a day (BID) | ORAL | 0 refills | Status: DC

## 2021-08-07 ENCOUNTER — Encounter: Payer: Self-pay | Admitting: Hematology

## 2021-08-08 ENCOUNTER — Ambulatory Visit: Payer: Medicaid Other

## 2021-08-09 ENCOUNTER — Encounter (HOSPITAL_COMMUNITY): Payer: Self-pay

## 2021-08-09 ENCOUNTER — Ambulatory Visit (HOSPITAL_COMMUNITY)
Admission: RE | Admit: 2021-08-09 | Discharge: 2021-08-09 | Disposition: A | Discharge status: Home / Self Care | Payer: Medicaid Other | Source: Ambulatory Visit | Attending: Family Medicine | Admitting: Family Medicine

## 2021-08-09 ENCOUNTER — Other Ambulatory Visit: Payer: Self-pay

## 2021-08-09 VITALS — BP 115/62 | HR 54 | Temp 98.5°F | Resp 18

## 2021-08-09 DIAGNOSIS — Z113 Encounter for screening for infections with a predominantly sexual mode of transmission: Secondary | ICD-10-CM | POA: Insufficient documentation

## 2021-08-09 DIAGNOSIS — N3000 Acute cystitis without hematuria: Secondary | ICD-10-CM | POA: Diagnosis not present

## 2021-08-09 DIAGNOSIS — N898 Other specified noninflammatory disorders of vagina: Secondary | ICD-10-CM | POA: Diagnosis present

## 2021-08-09 LAB — POCT URINALYSIS DIPSTICK, ED / UC
Glucose, UA: NEGATIVE mg/dL
Nitrite: POSITIVE — AB
Protein, ur: 100 mg/dL — AB
Specific Gravity, Urine: >=1.03 (ref 1.005–1.030)
Urobilinogen, UA: 1 mg/dL (ref 0.0–1.0)
pH: 6 (ref 5.0–8.0)

## 2021-08-09 MED ORDER — CEPHALEXIN 500 MG PO CAPS: 500.0000 mg | ORAL_CAPSULE | Freq: Two times a day (BID) | ORAL | 0 refills | Status: DC

## 2021-08-09 NOTE — ED Triage Notes (Signed)
Patient c/o dysuria x 1 week.   Patient denies any abnormal urine characteristics.   Patient endorses irritation during sexual intercourse.   Patient denies vaginal discharge. Patient states " I want to get STD testing because I felt the same way last times and I had a STD".   Patient endorses possible STD exposure.

## 2021-08-09 NOTE — Discharge Instructions (Signed)
We have sent testing for sexually transmitted infections as well as a urine culture. We will notify you of any positive results once they are received. If required, we will prescribe any medications you might need.  Please refrain from all sexual activity for at least the next seven days.

## 2021-08-09 NOTE — ED Provider Notes (Addendum)
[facility]   [id number] 08/09/21 Arrival Time: [id number]  ASSESSMENT & PLAN:  1. Acute cystitis without hematuria   2. Vaginal irritation   3. Screening for STDs (sexually transmitted diseases)    Begin: Meds ordered this encounter  Medications   cephALEXin (KEFLEX) 500 MG capsule    Sig: Take 1 capsule (500 mg total) by mouth 2 (two) times daily.    Dispense:  10 capsule    Refill:  0    Without s/s of PID.  Labs Reviewed  POCT URINALYSIS DIPSTICK, ED / UC - Abnormal; Notable for the following components:      Result Value   Bilirubin Urine SMALL (*)    Ketones, ur TRACE (*)    Hgb urine dipstick MODERATE (*)    Protein, ur 100 (*)    Nitrite POSITIVE (*)    Leukocytes,Ua SMALL (*)    All other components within normal limits  URINE CULTURE  CERVICOVAGINAL ANCILLARY ONLY   Vaginal cytology and urine culture pending.  Will notify of any positive results. Instructed to refrain from sexual activity for at least seven days.    Discharge Instructions      We have sent testing for sexually transmitted infections as well as a urine culture. We will notify you of any positive results once they are received. If required, we will prescribe any medications you might need.  Please refrain from all sexual activity for at least the next seven days.      Reviewed expectations re: course of current medical issues. Questions answered. Outlined signs and symptoms indicating need for more acute intervention. Patient verbalized understanding. After Visit Summary given.   SUBJECTIVE:  Melanie Hancock is a 25 y.o. female who presents with complaint of vaginal irritation and dysuria; sev days.. H/O trich with same symptoms. No specific aggravating or alleviating factors reported. Denies: gross hematuria. Afebrile. No abdominal or pelvic pain. Normal PO intake wihout n/v. No genital rashes or lesions. Reports that she is sexually active. OTC treatment: none.  No LMP  recorded (lmp unknown). Patient has had an injection.   OBJECTIVE:  Vitals:   08/09/21 1356  BP: 115/62  Pulse: (!) 54  Resp: 18  Temp: 98.5 F (36.9 C)  TempSrc: Oral  SpO2: 100%    General appearance: alert, cooperative, appears stated age and no distress Lungs: unlabored respirations; speaks full sentences without difficulty Back: no CVA tenderness; FROM at waist Abdomen: soft, non-tender GU: deferred Skin: warm and dry Psychological: alert and cooperative; normal mood and affect.  Results for orders placed or performed during the hospital encounter of 08/09/21  POC Urinalysis dipstick  Result Value Ref Range   Glucose, UA NEGATIVE NEGATIVE mg/dL   Bilirubin Urine SMALL (A) NEGATIVE   Ketones, ur TRACE (A) NEGATIVE mg/dL   Specific Gravity, Urine >=1.030 1.005 - 1.030   Hgb urine dipstick MODERATE (A) NEGATIVE   pH 6.0 5.0 - 8.0   Protein, ur 100 (A) NEGATIVE mg/dL   Urobilinogen, UA 1.0 0.0 - 1.0 mg/dL   Nitrite POSITIVE (A) NEGATIVE   Leukocytes,Ua SMALL (A) NEGATIVE    Labs Reviewed  POCT URINALYSIS DIPSTICK, ED / UC - Abnormal; Notable for the following components:      Result Value   Bilirubin Urine SMALL (*)    Ketones, ur TRACE (*)    Hgb urine dipstick MODERATE (*)    Protein, ur 100 (*)    Nitrite POSITIVE (*)    Leukocytes,Ua SMALL (*)  All other components within normal limits  URINE CULTURE  CERVICOVAGINAL ANCILLARY ONLY    No Known Allergies  Past Medical History:  Diagnosis Date   Anemia    Anxiety    Blood transfusion without reported diagnosis    Chronic back pain    Depression    GERD (gastroesophageal reflux disease)    Headache    Infection    UTI   Post partum depression    Pyelonephritis    UTI (lower urinary tract infection)    Family History  Problem Relation Age of Onset   Arthritis Mother    Healthy Father    Arthritis Maternal Grandmother    Asthma Son    Alcohol abuse Neg Hx    Social History    Socioeconomic History   Marital status: Single    Spouse name: Lismary Kiehn   Number of children: 3   Years of education: Not on file   Highest education level: Associate degree: occupational, Scientist, product/process development, or vocational program  Occupational History    Employer: [employer]  Tobacco Use   Smoking status: Former    Types: Cigarettes    Quit date: 01/21/2014    Years since quitting: 7.5   Smokeless tobacco: Never  Vaping Use   Vaping Use: Former   Substances: Nicotine, Flavoring  Substance and Sexual Activity   Alcohol use: Not Currently    Comment: rarely   Drug use: Not on file    Comment: 2-3 weeks ago   Sexual activity: Yes    Birth control/protection: Injection  Other Topics Concern   Not on file  Social History Narrative   Not on file   Social Determinants of Health   Financial Resource Strain: Not on file  Food Insecurity: Not on file  Transportation Needs: Not on file  Physical Activity: Not on file  Stress: Not on file  Social Connections: Not on file  Intimate Partner Violence: Not on file           [place], MD 08/09/21 1441    Mardella Layman, MD 08/09/21 1535

## 2021-08-10 LAB — CERVICOVAGINAL ANCILLARY ONLY
Bacterial Vaginitis (gardnerella): NEGATIVE
Candida Glabrata: NEGATIVE
Candida Vaginitis: POSITIVE — AB
Chlamydia: NEGATIVE
Comment: NEGATIVE
Comment: NEGATIVE
Comment: NEGATIVE
Comment: NEGATIVE
Comment: NEGATIVE
Comment: NORMAL
Neisseria Gonorrhea: NEGATIVE
Trichomonas: NEGATIVE

## 2021-08-11 ENCOUNTER — Telehealth (HOSPITAL_COMMUNITY): Payer: Self-pay | Admitting: Emergency Medicine

## 2021-08-11 MED ORDER — FLUCONAZOLE 150 MG PO TABS: 150.0000 mg | ORAL_TABLET | Freq: Once | ORAL | 0 refills | Status: AC

## 2021-08-12 LAB — URINE CULTURE: Culture: >=100000 — AB

## 2021-08-15 IMAGING — US US OB TRANSVAGINAL
1 series · 15 of 28 positions shown · non-contrast
Comparison: Obstetrical ultrasound August 06, 2019

CLINICAL DATA: Positive beta HCG. No gestational sac or fetal pole
seen on recent prior sonographic evaluation.

EXAM:
TRANSVAGINAL OB ULTRASOUND
TECHNIQUE: Transvaginal ultrasound was performed for complete evaluation of the
gestation as well as the maternal uterus, adnexal regions, and
pelvic cul-de-sac.

[Series 1: us ob transvaginal · 40 acquisitions, 15 frames shown]
[im 1/40]
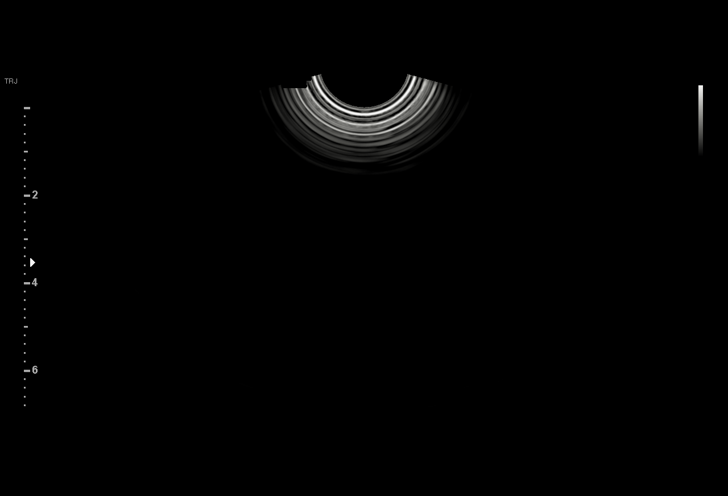
[im 3/40]
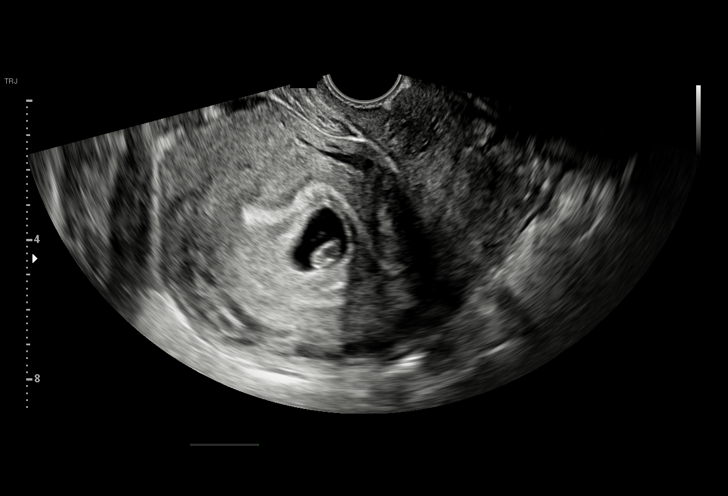
[im 6/40]
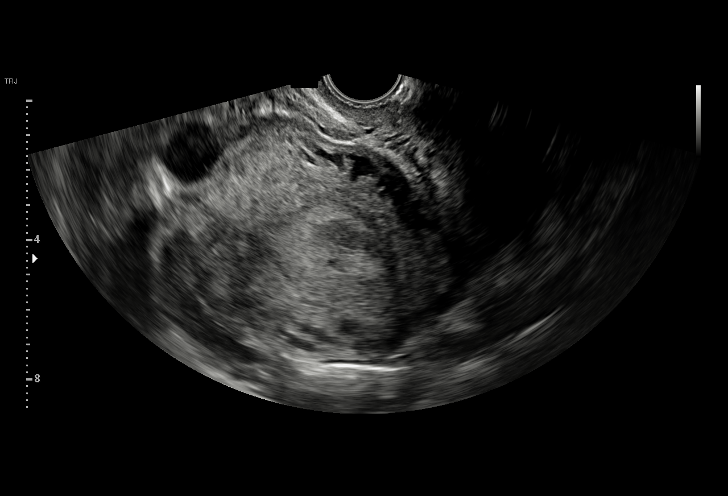
[im 9/40]
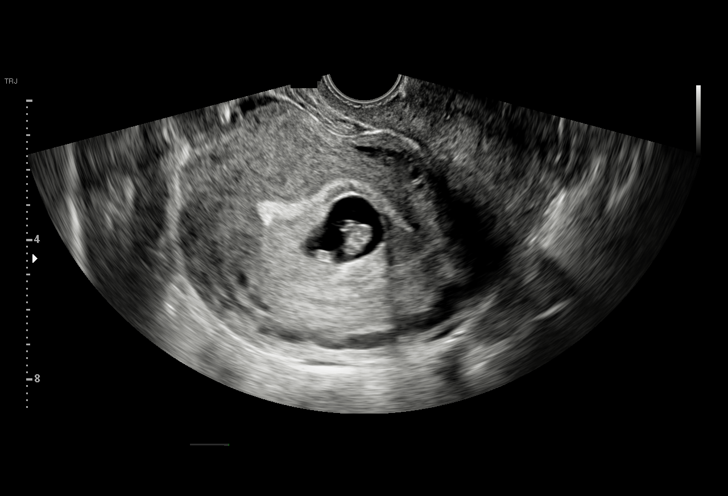
[im 12/40]
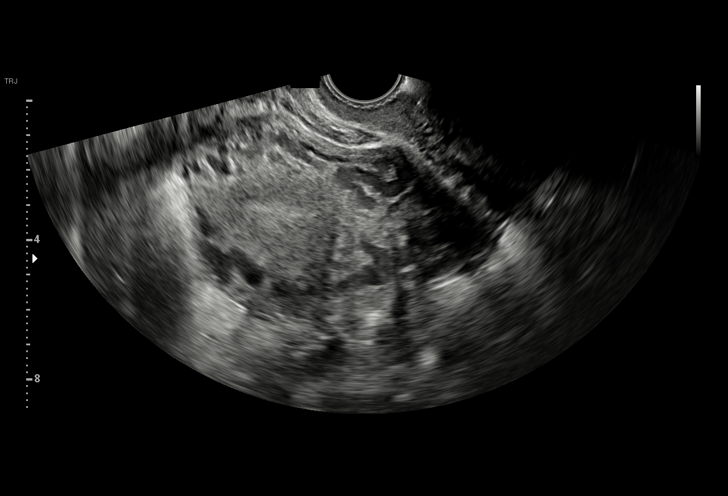
[im 15/40]
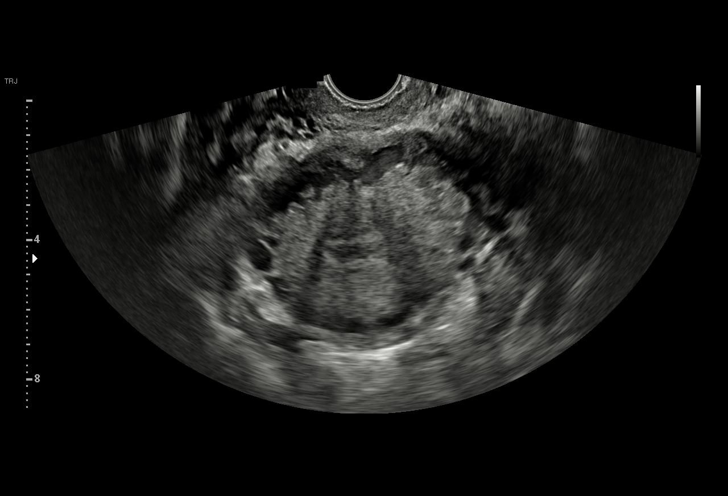
[im 18/40]
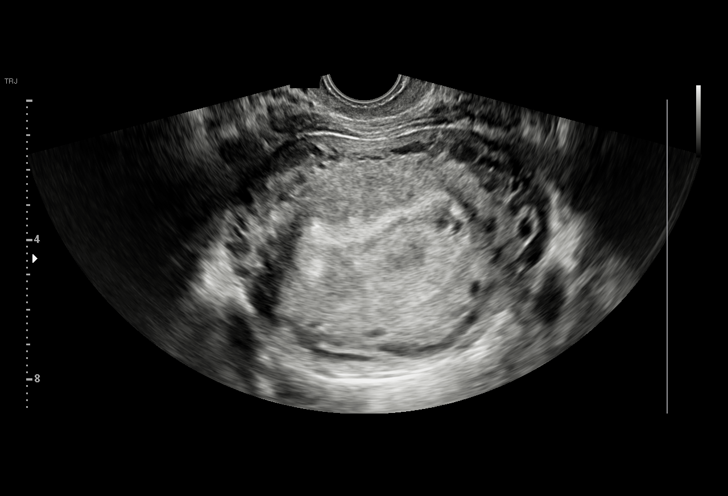
[im 21/40]
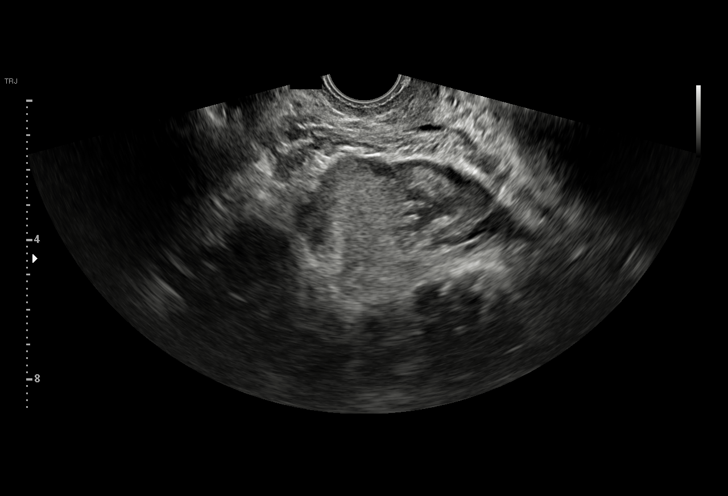
[im 22/40]
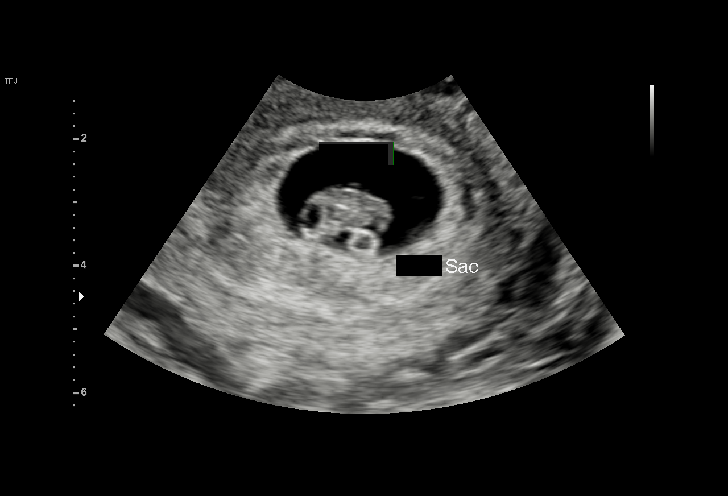
[im 25/40]
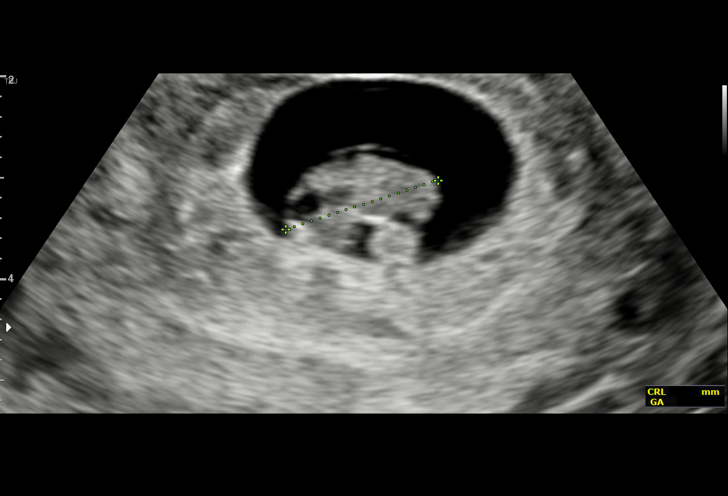
[im 28/40]
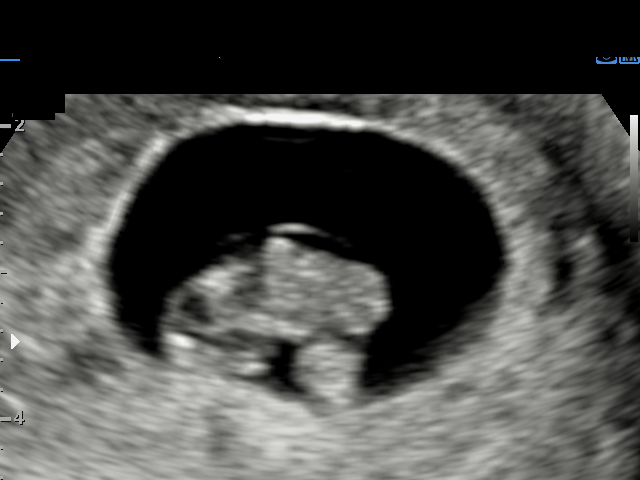
[im 31/40]
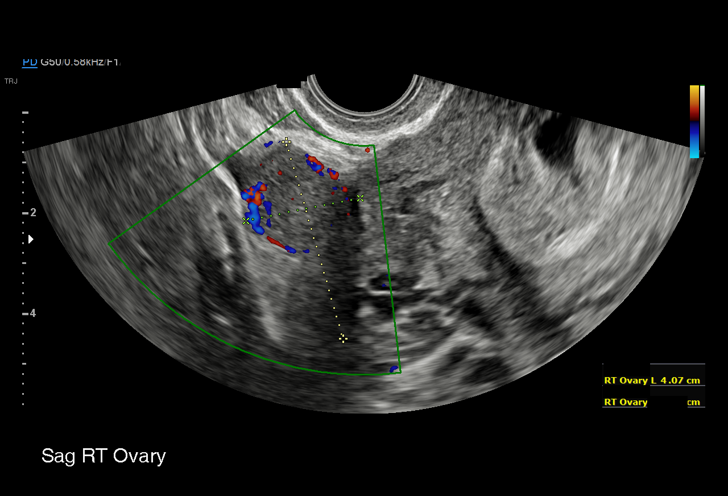
[im 34/40]
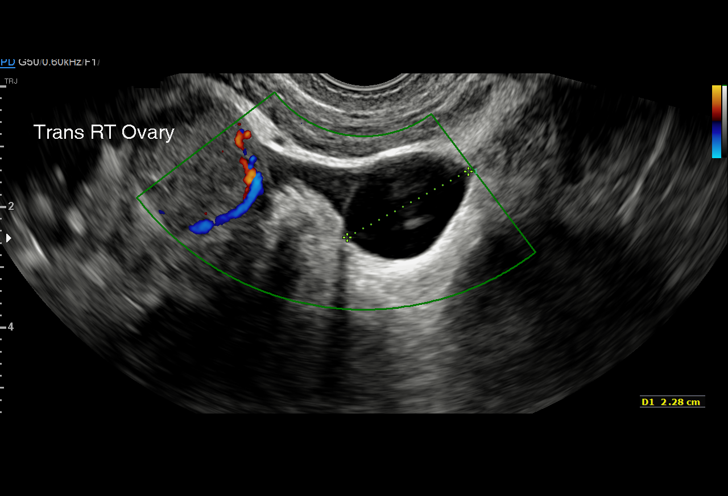
[im 37/40]
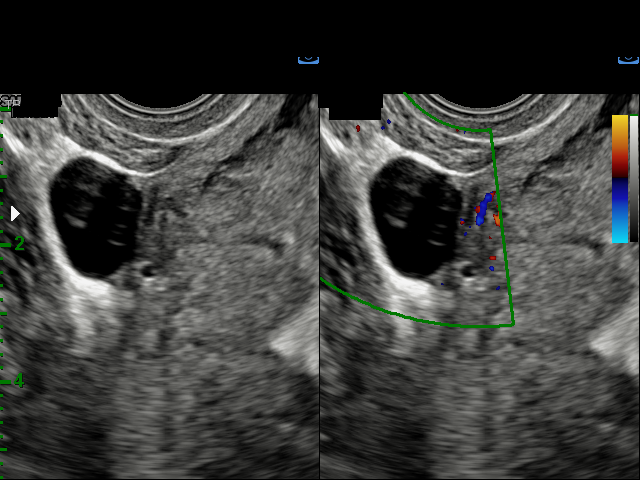
[im 40/40]
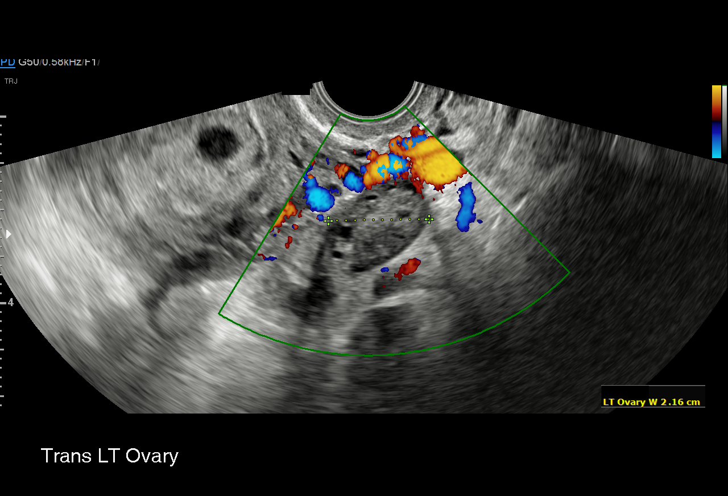

[15 of 28 positions shown; findings below may reference images not displayed]

FINDINGS: Intrauterine gestational sac: Visualized

Yolk sac:  Visualized

Embryo:  Visualized

Cardiac Activity: Visualized

Heart Rate: 166 bpm

CRL:   16 mm   7 w 6 d                  US EDC: April 08, 2020

Subchorionic hemorrhage:  None visualized.

Maternal uterus/adnexae: Cervical os is closed. Right ovary measures
4.1 x 2.3 x 2.5 cm. Left ovary measures 3.6 x 1.4 x 2.2 cm. There is
a 2.3 x 1.9 x 1.3 cm cyst in the right adnexa. A small amount of
adjacent fluid noted.
IMPRESSION: Single live intrauterine gestation with estimated gestational age of
approximately 8 weeks. No subchorionic hemorrhage.

Right adnexal cyst, benign in appearance. Small amount of adjacent
fluid may indicate recent ovarian cyst leakage in this area. No
other extrauterine pelvic mass or fluid.

## 2021-08-21 ENCOUNTER — Encounter (HOSPITAL_COMMUNITY): Payer: Self-pay

## 2021-08-21 ENCOUNTER — Ambulatory Visit (HOSPITAL_COMMUNITY)
Admission: RE | Admit: 2021-08-21 | Discharge: 2021-08-21 | Disposition: A | Discharge status: Home / Self Care | Payer: Medicaid Other | Source: Ambulatory Visit | Attending: Internal Medicine | Admitting: Internal Medicine

## 2021-08-21 ENCOUNTER — Other Ambulatory Visit: Payer: Self-pay

## 2021-08-21 VITALS — BP 110/64 | HR 51 | Temp 98.4°F | Resp 18

## 2021-08-21 DIAGNOSIS — N760 Acute vaginitis: Secondary | ICD-10-CM | POA: Insufficient documentation

## 2021-08-21 LAB — POCT URINALYSIS DIPSTICK, ED / UC
Bilirubin Urine: NEGATIVE
Glucose, UA: NEGATIVE mg/dL
Hgb urine dipstick: NEGATIVE
Ketones, ur: NEGATIVE mg/dL
Leukocytes,Ua: NEGATIVE
Nitrite: NEGATIVE
Protein, ur: NEGATIVE mg/dL
Specific Gravity, Urine: 1.025 (ref 1.005–1.030)
Urobilinogen, UA: 0.2 mg/dL (ref 0.0–1.0)
pH: 7 (ref 5.0–8.0)

## 2021-08-21 MED ORDER — PHENAZOPYRIDINE HCL 200 MG PO TABS: 200.0000 mg | ORAL_TABLET | Freq: Three times a day (TID) | ORAL | 0 refills | Status: DC | PRN

## 2021-08-21 NOTE — ED Triage Notes (Signed)
Pt reports  dysuria,urgency and lower back pain. Sx's ongoing since last week.

## 2021-08-21 NOTE — Discharge Instructions (Addendum)
Please take medications as prescribed Increase oral fluid intake Please pick up your prescriptions at the pharmacy and take care Return to urgent care if you have any other concerns. We will call you with recommendations if labs are abnormal Avoid sexual intercourse until lab results are available.

## 2021-08-22 LAB — POCT URINALYSIS DIPSTICK, ED / UC
Bilirubin Urine: NEGATIVE
Glucose, UA: NEGATIVE mg/dL
Ketones, ur: NEGATIVE mg/dL
Nitrite: NEGATIVE
Protein, ur: 100 mg/dL — AB
Specific Gravity, Urine: 1.025 (ref 1.005–1.030)
Urobilinogen, UA: 0.2 mg/dL (ref 0.0–1.0)
pH: 5.5 (ref 5.0–8.0)

## 2021-08-22 LAB — CERVICOVAGINAL ANCILLARY ONLY
Bacterial Vaginitis (gardnerella): NEGATIVE
Chlamydia: NEGATIVE
Comment: NEGATIVE
Comment: NEGATIVE
Comment: NEGATIVE
Comment: NORMAL
Neisseria Gonorrhea: NEGATIVE
Trichomonas: POSITIVE — AB

## 2021-08-22 NOTE — ED Provider Notes (Signed)
[facility]    CSN: [id number] Arrival date & time: 08/21/21  1307      History   Chief Complaint Chief Complaint  Patient presents with   Dysuria   Urinary Urgency   Back Pain    HPI Melanie Hancock is a 26 y.o. female comes to the urgent care with ongoing dysuria urgency and lower back pain.  Patient was diagnosed with E. coli UTI on 12/21 and completed a course of cephalosporins.  Patient had vaginal candidiasis on cervicovaginal swab.  She has not picked up the fluconazole from the pharmacy yet.  She is worried that she may still have a urinary tract infection.  No fever or chills.  No vaginal itching.  She continues to have some dysuria and urgency.  No nausea, vomiting or diarrhea.  No flank pain.  Patient has some intermittent vaginal irritation but no actual vaginal discharge.  No issues with intercourse.  No abdominal pain HPI  Past Medical History:  Diagnosis Date   Anemia    Anxiety    Blood transfusion without reported diagnosis    Chronic back pain    Depression    GERD (gastroesophageal reflux disease)    Headache    Infection    UTI   Post partum depression    Pyelonephritis    UTI (lower urinary tract infection)     Patient Active Problem List   Diagnosis Date Noted   Postpartum care following vaginal delivery 8/4 03/24/2020   Acute intractable headache 03/14/2020   Vaginal delivery 02/04/2016   History of postpartum depression, currently pregnant 01/23/2016   Iron deficiency anemia 01/02/2016   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   Herpes simplex 11/06/2015   Trichomoniasis 06/17/2015   Anxiety 03/28/2014   Chronic back pain 03/28/2014   Hx pyelonephritis 2014 05/26/2013    Past Surgical History:  Procedure Laterality Date   DILATION AND CURETTAGE OF UTERUS     INDUCED ABORTION     WISDOM TOOTH EXTRACTION      OB History     Gravida  5   Para  4   Term  4   Preterm      AB  1   Living  4      SAB  0   IAB  1   Ectopic       Multiple  0   Live Births  4            Home Medications    Prior to Admission medications   Medication Sig Start Date End Date Taking? Authorizing Provider  phenazopyridine (PYRIDIUM) 200 MG tablet Take 1 tablet (200 mg total) by mouth 3 (three) times daily as needed for pain. 08/21/21  Yes Sadiel Mota, Myrene Galas, MD  Cetirizine HCl 10 MG CAPS Take 1 capsule (10 mg total) by mouth daily for 10 days. 03/03/21 03/13/21  Wieters, Hallie C, PA-C  pantoprazole (PROTONIX) 20 MG tablet Take 1 tablet (20 mg total) by mouth daily. 09/17/19 11/12/19  Seabron Spates, CNM    Family History Family History  Problem Relation Age of Onset   Arthritis Mother    Healthy Father    Arthritis Maternal Grandmother    Asthma Son    Alcohol abuse Neg Hx     Social History Social History   Tobacco Use   Smoking status: Former    Types: Cigarettes    Quit date: 01/21/2014    Years since quitting: 7.5   Smokeless tobacco:  Never  Vaping Use   Vaping Use: Former   Substances: Nicotine, Flavoring  Substance Use Topics   Alcohol use: Not Currently    Comment: rarely     Allergies   Patient has no known allergies.   Review of Systems Review of Systems  HENT: Negative.    Respiratory: Negative.    Gastrointestinal:  Negative for abdominal pain, nausea and vomiting.  Genitourinary:  Positive for dysuria, frequency, urgency and vaginal discharge.  Musculoskeletal:  Positive for back pain.  Skin: Negative.     Physical Exam Triage Vital Signs ED Triage Vitals  Enc Vitals Group     BP 08/21/21 1327 110/64     Pulse Rate 08/21/21 1327 (!) 51     Resp 08/21/21 1327 18     Temp 08/21/21 1327 98.4 F (36.9 C)     Temp src --      SpO2 08/21/21 1327 100 %     Weight --      Height --      Head Circumference --      Peak Flow --      Pain Score 08/21/21 1325 6     Pain Loc --      Pain Edu? --      Excl. in [place]? --    No data found.  Updated Vital Signs BP 110/64    Pulse (!) 51     Temp 98.4 F (36.9 C)    Resp 18    LMP  (LMP Unknown)    SpO2 100%   Visual Acuity Right Eye Distance:   Left Eye Distance:   Bilateral Distance:    Right Eye Near:   Left Eye Near:    Bilateral Near:     Physical Exam Vitals and nursing note reviewed.  Constitutional:      General: She is not in acute distress.    Appearance: Normal appearance. She is not ill-appearing.  Cardiovascular:     Rate and Rhythm: Normal rate and regular rhythm.  Pulmonary:     Effort: Pulmonary effort is normal.     Breath sounds: Normal breath sounds.  Abdominal:     General: Bowel sounds are normal.     Palpations: Abdomen is soft.  Musculoskeletal:     Cervical back: Normal range of motion and neck supple. No rigidity or tenderness.  Neurological:     Mental Status: She is alert.     UC Treatments / Results  Labs (all labs ordered are listed, but only abnormal results are displayed)   EKG   Radiology No results found.  Procedures Procedures (including critical care time)  Medications Ordered in UC Medications - No data to display  Initial Impression / Assessment and Plan / UC Course  I have reviewed the triage vital signs and the nursing notes.  Pertinent labs & imaging results that were available during my care of the patient were reviewed by me and considered in my medical decision making (see chart for details).     1.  Acute vaginitis: Patient is advised to pick up her fluconazole Point-of-care urinalysis is negative for UTI Pyridium as needed for pain Increase oral fluid intake If patient has any further concerns she is advised to return to urgent care to be reevaluated   Final Clinical Impressions(s) / UC Diagnoses   Final diagnoses:  Acute vaginitis     Discharge Instructions      Please take medications as prescribed Increase oral fluid intake  Please pick up your prescriptions at the pharmacy and take care Return to urgent care if you have any other  concerns. We will call you with recommendations if labs are abnormal Avoid sexual intercourse until lab results are available.   ED Prescriptions     Medication Sig Dispense Auth. Provider   phenazopyridine (PYRIDIUM) 200 MG tablet Take 1 tablet (200 mg total) by mouth 3 (three) times daily as needed for pain. 6 tablet Dhruvan Gullion, Myrene Galas, MD      PDMP not reviewed this encounter.   Chase Picket, MD 08/22/21 [phone]

## 2021-08-24 ENCOUNTER — Telehealth (HOSPITAL_COMMUNITY): Payer: Self-pay

## 2021-08-24 MED ORDER — METRONIDAZOLE 500 MG PO TABS: 500.0000 mg | ORAL_TABLET | Freq: Two times a day (BID) | ORAL | 0 refills | Status: DC

## 2021-10-17 ENCOUNTER — Ambulatory Visit
Admission: RE | Admit: 2021-10-17 | Discharge: 2021-10-17 | Disposition: A | Discharge status: Home / Self Care | Payer: Medicaid Other | Source: Ambulatory Visit | Attending: Emergency Medicine | Admitting: Emergency Medicine

## 2021-10-17 VITALS — BP 101/67 | HR 62 | Temp 98.6°F | Resp 16

## 2021-10-17 DIAGNOSIS — Z113 Encounter for screening for infections with a predominantly sexual mode of transmission: Secondary | ICD-10-CM | POA: Insufficient documentation

## 2021-10-17 DIAGNOSIS — Z3202 Encounter for pregnancy test, result negative: Secondary | ICD-10-CM

## 2021-10-17 DIAGNOSIS — R112 Nausea with vomiting, unspecified: Secondary | ICD-10-CM | POA: Insufficient documentation

## 2021-10-17 DIAGNOSIS — Z8619 Personal history of other infectious and parasitic diseases: Secondary | ICD-10-CM | POA: Diagnosis not present

## 2021-10-17 LAB — POCT URINALYSIS DIP (MANUAL ENTRY)
Bilirubin, UA: NEGATIVE
Blood, UA: NEGATIVE
Glucose, UA: NEGATIVE mg/dL
Ketones, POC UA: NEGATIVE mg/dL
Leukocytes, UA: NEGATIVE
Nitrite, UA: NEGATIVE
Protein Ur, POC: NEGATIVE mg/dL
Spec Grav, UA: 1.02 (ref 1.010–1.025)
Urobilinogen, UA: 0.2 E.U./dL
pH, UA: 7.5 (ref 5.0–8.0)

## 2021-10-17 LAB — POCT URINE PREGNANCY: Preg Test, Ur: NEGATIVE

## 2021-10-17 MED ORDER — ONDANSETRON 4 MG PO TBDP: 4.0000 mg | ORAL_TABLET | Freq: Three times a day (TID) | ORAL | 0 refills | Status: DC | PRN

## 2021-10-17 NOTE — ED Provider Notes (Signed)
[facility]    CSN: [id number] Arrival date & time: 10/17/21  1304    HISTORY   Chief Complaint  Patient presents with   appt 1300   HPI Melanie Hancock is a 26 y.o. female. Patient reports being exposed to trichomoniasis last year and she is not sure if she was ever treated.  Patient's here today for STD screening because she has been having some spotting.  Patient states she currently uses Depo for birth control.  Not using condoms for STD prevention.    Pt also also complains of abdominal pain and nausea, vomiting and diarrhea.  Patient states she had to miss work.  Patient states she ate [employer] yesterday, is wondering if she had food poisoning or a GI bug.  Patient states that she would like to know what caused her symptoms.    The history is provided by the patient.  Past Medical History:  Diagnosis Date   Anemia    Anxiety    Blood transfusion without reported diagnosis    Chronic back pain    Depression    GERD (gastroesophageal reflux disease)    Headache    Infection    UTI   Post partum depression    Pyelonephritis    UTI (lower urinary tract infection)    Patient Active Problem List   Diagnosis Date Noted   Postpartum care following vaginal delivery 8/4 03/24/2020   Acute intractable headache 03/14/2020   Vaginal delivery 02/04/2016   History of postpartum depression, currently pregnant 01/23/2016   Iron deficiency anemia 01/02/2016   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   Herpes simplex 11/06/2015   Trichomoniasis 06/17/2015   Anxiety 03/28/2014   Chronic back pain 03/28/2014   Hx pyelonephritis 2014 05/26/2013   Past Surgical History:  Procedure Laterality Date   DILATION AND CURETTAGE OF UTERUS     INDUCED ABORTION     WISDOM TOOTH EXTRACTION     OB History     Gravida  5   Para  4   Term  4   Preterm      AB  1   Living  4      SAB  0   IAB  1   Ectopic      Multiple  0   Live Births  4          Home Medications     Prior to Admission medications   Medication Sig Start Date End Date Taking? Authorizing Provider  pantoprazole (PROTONIX) 20 MG tablet Take 1 tablet (20 mg total) by mouth daily. 09/17/19 11/12/19  Seabron Spates, CNM   Family History Family History  Problem Relation Age of Onset   Arthritis Mother    Healthy Father    Arthritis Maternal Grandmother    Asthma Son    Alcohol abuse Neg Hx    Social History Social History   Tobacco Use   Smoking status: Former    Types: Cigarettes    Quit date: 01/21/2014    Years since quitting: 7.7   Smokeless tobacco: Never  Vaping Use   Vaping Use: Former   Substances: Nicotine, Flavoring  Substance Use Topics   Alcohol use: Not Currently    Comment: rarely   Allergies   Patient has no known allergies.  Review of Systems Review of Systems Pertinent findings noted in history of present illness.   Physical Exam Triage Vital Signs ED Triage Vitals  Enc Vitals Group  BP 06/16/21 0827 (!) 147/82     Pulse Rate 06/16/21 0827 72     Resp 06/16/21 0827 18     Temp 06/16/21 0827 98.3 F (36.8 C)     Temp Source 06/16/21 0827 Oral     SpO2 06/16/21 0827 98 %     Weight --      Height --      Head Circumference --      Peak Flow --      Pain Score 06/16/21 0826 5     Pain Loc --      Pain Edu? --      Excl. in [place]? --   No data found.  Updated Vital Signs BP 101/67 (BP Location: Right Arm)    Pulse 62    Temp 98.6 F (37 C) (Oral)    Resp 16    SpO2 99%   Physical Exam Vitals and nursing note reviewed.  Constitutional:      General: She is not in acute distress.    Appearance: Normal appearance. She is not ill-appearing.  HENT:     Head: Normocephalic and atraumatic.  Eyes:     General: Lids are normal.        Right eye: No discharge.        Left eye: No discharge.     Extraocular Movements: Extraocular movements intact.     Conjunctiva/sclera: Conjunctivae normal.     Right eye: Right conjunctiva is not injected.      Left eye: Left conjunctiva is not injected.  Neck:     Trachea: Trachea and phonation normal.  Cardiovascular:     Rate and Rhythm: Normal rate and regular rhythm.     Pulses: Normal pulses.     Heart sounds: Normal heart sounds. No murmur heard.   No friction rub. No gallop.  Pulmonary:     Effort: Pulmonary effort is normal. No accessory muscle usage, prolonged expiration or respiratory distress.     Breath sounds: Normal breath sounds. No stridor, decreased air movement or transmitted upper airway sounds. No decreased breath sounds, wheezing, rhonchi or rales.  Chest:     Chest wall: No tenderness.  Genitourinary:    Comments: Patient politely declines pelvic exam today, patient provided a vaginal swab for testing. Musculoskeletal:        General: Normal range of motion.     Cervical back: Normal range of motion and neck supple. Normal range of motion.  Lymphadenopathy:     Cervical: No cervical adenopathy.  Skin:    General: Skin is warm and dry.     Findings: No erythema or rash.  Neurological:     General: No focal deficit present.     Mental Status: She is alert and oriented to person, place, and time.  Psychiatric:        Mood and Affect: Mood normal.        Behavior: Behavior normal.    Visual Acuity Right Eye Distance:   Left Eye Distance:   Bilateral Distance:    Right Eye Near:   Left Eye Near:    Bilateral Near:     UC Couse / Diagnostics / Procedures:    EKG  Radiology No results found.  Procedures Procedures (including critical care time)  UC Diagnoses / Final Clinical Impressions(s)   I have reviewed the triage vital signs and the nursing notes.  Pertinent labs & imaging results that were available during my care of the patient were  reviewed by me and considered in my medical decision making (see chart for details).    Final diagnoses:  Screening examination for STD (sexually transmitted disease)  History of trichomoniasis  Nausea and  vomiting, unspecified vomiting type   STD screening was performed, patient advised that the results be posted to their MyChart and if any of the results are positive, they will be notified by phone, further treatment will be provided as indicated based on results of STD screening.  Patient advised that we have no way to determine whether or not she had food poisoning or a mild case of gastroenteritis. Return precautions advised.  Drug allergies reviewed, all questions addressed.     ED Prescriptions     Medication Sig Dispense Auth. Provider   ondansetron (ZOFRAN-ODT) 4 MG disintegrating tablet Take 1 tablet (4 mg total) by mouth every 8 (eight) hours as needed for nausea or vomiting. 20 tablet Lynden Oxford Scales, PA-C      PDMP not reviewed this encounter.  Pending results:  Labs Reviewed  POCT URINALYSIS DIP (MANUAL ENTRY)  POCT URINE PREGNANCY  CERVICOVAGINAL ANCILLARY ONLY    Medications Ordered in UC: Medications - No data to display  Disposition Upon Discharge:  Condition: stable for discharge home  Patient presented with concern for an acute illness with associated systemic symptoms and significant discomfort requiring urgent management. In my opinion, this is a condition that a prudent lay person (someone who possesses an average knowledge of health and medicine) may potentially expect to result in complications if not addressed urgently such as respiratory distress, impairment of bodily function or dysfunction of bodily organs.   As such, the patient has been evaluated and assessed, work-up was performed and treatment was provided in alignment with urgent care protocols and evidence based medicine.  Patient/parent/caregiver has been advised that the patient may require follow up for further testing and/or treatment if the symptoms continue in spite of treatment, as clinically indicated and appropriate.  Routine symptom specific, illness specific and/or disease specific  instructions were discussed with the patient and/or caregiver at length.  Prevention strategies for avoiding STD exposure were also discussed.  The patient will follow up with their current PCP if and as advised. If the patient does not currently have a PCP we will assist them in obtaining one.   The patient may need specialty follow up if the symptoms continue, in spite of conservative treatment and management, for further workup, evaluation, consultation and treatment as clinically indicated and appropriate.  Patient/parent/caregiver verbalized understanding and agreement of plan as discussed.  All questions were addressed during visit.  Please see discharge instructions below for further details of plan.  Discharge Instructions:   Discharge Instructions      Your urinalysis today was normal.  Your urine pregnancy test today was negative.  The results of your STD testing today will be made available to you once they are complete, this typically takes 3 to 5 days.  They will initially be posted to your MyChart and, if any of your results are abnormal, you will receive a phone call with those results along with further instructions regarding treatment and any prescriptions, if needed.    Please remember that the only way to prevent transmission of sexually transmitted disease when having sexual intercourse is to use condoms.  Repeat sexually transmitted infections can cause scarring in your fallopian tubes which will interfere with your ability to conceive later in life.    Repeat exposures to sexually transmitted  diseases can also increase your risk of human papilloma virus which causes cervical cancer and genital warts as well as HIV.  If you require treatment and do not have the complete resolution of your symptoms after treatment, please return to urgent care or follow-up with your gynecologist for repeat evaluation.  Thank you for visiting urgent care today.  I appreciate the  opportunity to participate in your care.        This office note has been dictated using Museum/gallery curator.  Unfortunately, and despite my best efforts, this method of dictation can sometimes lead to occasional typographical or grammatical errors.  I apologize in advance if this occurs.      Lynden Oxford Scales, [place] 10/18/21 [phone]

## 2021-10-17 NOTE — Discharge Instructions (Addendum)
Your urinalysis today was normal.  Your urine pregnancy test today was negative.  The results of your STD testing today will be made available to you once they are complete, this typically takes 3 to 5 days.  They will initially be posted to your MyChart and, if any of your results are abnormal, you will receive a phone call with those results along with further instructions regarding treatment and any prescriptions, if needed.    Please remember that the only way to prevent transmission of sexually transmitted disease when having sexual intercourse is to use condoms.  Repeat sexually transmitted infections can cause scarring in your fallopian tubes which will interfere with your ability to conceive later in life.    Repeat exposures to sexually transmitted diseases can also increase your risk of human papilloma virus which causes cervical cancer and genital warts as well as HIV.  If you require treatment and do not have the complete resolution of your symptoms after treatment, please return to urgent care or follow-up with your gynecologist for repeat evaluation.  Thank you for visiting urgent care today.  I appreciate the opportunity to participate in your care.

## 2021-10-17 NOTE — ED Triage Notes (Signed)
Pt reports had a sexual partner that gave her Trich last year and uncertain if ever got treated so wants to make sure if still has something since will have some spotting due to her depo or an STD  Pt also having n/v/d and abd pains and had to miss work. Unsure if related to [employer] yesterday/food poisoning or GI bug. Wants to know what caused her symptoms.

## 2021-10-18 ENCOUNTER — Telehealth (HOSPITAL_COMMUNITY): Payer: Self-pay | Admitting: Emergency Medicine

## 2021-10-18 LAB — CERVICOVAGINAL ANCILLARY ONLY
Bacterial Vaginitis (gardnerella): POSITIVE — AB
Candida Glabrata: NEGATIVE
Candida Vaginitis: NEGATIVE
Chlamydia: NEGATIVE
Comment: NEGATIVE
Comment: NEGATIVE
Comment: NEGATIVE
Comment: NEGATIVE
Comment: NEGATIVE
Comment: NORMAL
Neisseria Gonorrhea: NEGATIVE
Trichomonas: POSITIVE — AB

## 2021-10-18 MED ORDER — METRONIDAZOLE 500 MG PO TABS: 500.0000 mg | ORAL_TABLET | Freq: Two times a day (BID) | ORAL | 0 refills | Status: DC

## 2021-11-01 ENCOUNTER — Ambulatory Visit: Payer: Medicaid Other

## 2022-02-02 ENCOUNTER — Ambulatory Visit (HOSPITAL_COMMUNITY): Payer: Medicaid Other

## 2022-02-07 ENCOUNTER — Ambulatory Visit: Payer: Self-pay

## 2022-05-01 ENCOUNTER — Ambulatory Visit: Payer: Self-pay

## 2022-05-02 ENCOUNTER — Ambulatory Visit: Payer: Self-pay

## 2022-07-03 ENCOUNTER — Ambulatory Visit
Admission: RE | Admit: 2022-07-03 | Discharge: 2022-07-03 | Disposition: A | Discharge status: Home / Self Care | Payer: Medicaid Other | Source: Ambulatory Visit | Attending: Internal Medicine | Admitting: Internal Medicine

## 2022-07-03 VITALS — BP 99/65 | HR 66 | Temp 98.2°F | Resp 17

## 2022-07-03 DIAGNOSIS — Z113 Encounter for screening for infections with a predominantly sexual mode of transmission: Secondary | ICD-10-CM | POA: Insufficient documentation

## 2022-07-03 DIAGNOSIS — K644 Residual hemorrhoidal skin tags: Secondary | ICD-10-CM | POA: Insufficient documentation

## 2022-07-03 DIAGNOSIS — N898 Other specified noninflammatory disorders of vagina: Secondary | ICD-10-CM | POA: Diagnosis present

## 2022-07-03 LAB — POCT URINE PREGNANCY: Preg Test, Ur: NEGATIVE

## 2022-07-03 MED ORDER — PRAMOXINE HCL (PERIANAL) 1 % EX FOAM: 1.0000 | Freq: Three times a day (TID) | CUTANEOUS | 0 refills | Status: DC | PRN

## 2022-07-03 NOTE — Discharge Instructions (Signed)
Pregnancy test was negative.  Your vaginal swab is pending.  We will call if it is positive and treat as appropriate.  We ask that you refrain from sexual activity until test results and treatment are complete.  I have prescribed you Proctofoam for your hemorrhoid.  You may also use sitz bath's which I have attached information regarding this.  Follow-up if any symptoms persist or worsen.

## 2022-07-03 NOTE — ED Provider Notes (Signed)
[facility]    CSN: [id number] Arrival date & time: 07/03/22  1159      History   Chief Complaint Chief Complaint  Patient presents with   Vaginal Itching    Possible hemorrhoid - Entered by patient    HPI Melanie Hancock is a 26 y.o. female.   Patient presents with nausea, vaginal irritation, hemorrhoid.  Patient reports that nausea and vaginal irritation have been present for about 4 days.  She denies any associated vomiting.  She does endorse some mild lower abdominal cramping.  Patient is concerned for pregnancy and would like pregnancy testing completed today given these associated symptoms.  Last menstrual cycle was 06/17/2022.  Patient denies dysuria, urinary frequency, fever.  She does endorse some right-sided back pain but reports that she has sciatica so is not sure if symptoms are attributed to this.  Patient denies any exposure to STD but has had unprotected sexual intercourse prior to symptoms starting.  Patient also complaining of possible hemorrhoid.  She states that she has had a hemorrhoid in the past and used Proctofoam with resolution.  She states that she has been having some intermittent rectal pain over the past few days.  She reports that she has been having some diarrhea so is not sure if symptoms are attributed.  Denies any rectal bleeding.   Vaginal Itching    Past Medical History:  Diagnosis Date   Anemia    Anxiety    Blood transfusion without reported diagnosis    Chronic back pain    Depression    GERD (gastroesophageal reflux disease)    Headache    Infection    UTI   Post partum depression    Pyelonephritis    UTI (lower urinary tract infection)     Patient Active Problem List   Diagnosis Date Noted   Postpartum care following vaginal delivery 8/4 03/24/2020   Acute intractable headache 03/14/2020   Vaginal delivery 02/04/2016   History of postpartum depression, currently pregnant 01/23/2016   Iron deficiency anemia  01/02/2016   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 Authorizing Provider  pramoxine (PROCTOFOAM) 1 % foam Place 1 Application rectally 3 (three) times daily as needed for anal itching, anal irritation or hemorrhoids. 07/03/22  Yes , Hildred Alamin E, FNP  metroNIDAZOLE (FLAGYL) 500 MG tablet Take 1 tablet (500 mg total) by mouth 2 (two) times daily. 10/18/21   Chase Picket, MD  ondansetron (ZOFRAN-ODT) 4 MG disintegrating tablet Take 1 tablet (4 mg total) by mouth every 8 (eight) hours as needed for nausea or vomiting. 10/17/21   Lynden Oxford  Scales, PA-C  pantoprazole (PROTONIX) 20 MG tablet Take 1 tablet (20 mg total) by mouth daily. 09/17/19 11/12/19  Aviva Signs, CNM    Family History Family History  Problem Relation Age of Onset   Arthritis Mother    Healthy Father    Arthritis Maternal Grandmother    Asthma Son    Alcohol abuse Neg Hx     Social History Social History   Tobacco Use   Smoking status: Former    Types: Cigarettes    Quit date: 01/21/2014    Years since quitting: 8.4   Smokeless tobacco: Never  Vaping Use   Vaping Use: Former   Substances: Nicotine, Flavoring  Substance Use Topics   Alcohol use: Not Currently    Comment: rarely     Allergies   Patient has no known allergies.   Review of Systems Review of Systems Per HPI  Physical Exam Triage Vital Signs ED Triage Vitals  Enc Vitals  Group     BP 07/03/22 1214 99/65     Pulse Rate 07/03/22 1214 66     Resp 07/03/22 1214 17     Temp 07/03/22 1214 98.2 F (36.8 C)     Temp Source 07/03/22 1214 Oral     SpO2 07/03/22 1214 98 %     Weight --      Height --      Head Circumference --      Peak Flow --      Pain Score 07/03/22 1213 5     Pain Loc --      Pain Edu? --      Excl. in GC? --    No data found.  Updated Vital Signs BP 99/65   Pulse 66   Temp 98.2 F (36.8 C) (Oral)   Resp 17   LMP 06/11/2022 (Approximate)   SpO2 98%   Visual Acuity Right Eye Distance:   Left Eye Distance:   Bilateral Distance:    Right Eye Near:   Left Eye Near:    Bilateral Near:     Physical Exam Exam conducted with a chaperone present.  Constitutional:      General: She is not in acute distress.    Appearance: Normal appearance. She is not toxic-appearing or diaphoretic.  HENT:     Head: Normocephalic and atraumatic.  Eyes:     Extraocular Movements: Extraocular movements intact.     Conjunctiva/sclera: Conjunctivae normal.  Pulmonary:     Effort: Pulmonary effort is normal.  Genitourinary:    Rectum: External hemorrhoid present.     Comments: Vaginal exam deferred with shared decision making. Self vaginal swab performed.   External hemorrhoid noted on exam that does not appear to be bleeding or thrombosed.  Neurological:     General: No focal deficit present.     Mental Status: She is alert and oriented to person, place, and time. Mental status is at baseline.  Psychiatric:        Mood and Affect: Mood normal.        Behavior: Behavior normal.        Thought Content: Thought content normal.        Judgment: Judgment normal.      UC Treatments / Results  Labs (all labs ordered are listed, but only abnormal results are displayed) Labs Reviewed  POCT URINE PREGNANCY  CERVICOVAGINAL ANCILLARY ONLY    EKG   Radiology No results found.  Procedures Procedures (including critical care  time)  Medications Ordered in UC Medications - No data to display  Initial Impression / Assessment and Plan / UC Course  I have reviewed the triage vital signs and the nursing notes.  Pertinent labs & imaging results that were available during my care of the patient were reviewed by me and considered in my medical decision making (see chart for details).     Urine pregnancy test was negative.  Given no associated urinary symptoms will defer UA.  Cervicovaginal swab pending to assess for vaginal irritation etiology.  Will await results for any further treatment.  Patient advised to refrain from sexual activity until test results and treatment are complete.  It appears the patient has external hemorrhoid on exam.  Will treat with Proctofoam as patient has had resolution of symptoms with this in the past.  Patient was advised of supportive care including sitz bath's as well.  Patient verbalized understanding and was agreeable with plan. Final Clinical Impressions(s) / UC Diagnoses   Final diagnoses:  Vaginal irritation  Screening examination for venereal disease  External hemorrhoid     Discharge Instructions      Pregnancy test was negative.  Your vaginal swab is pending.  We will call if it is positive and treat as appropriate.  We ask that you refrain from sexual activity until test results and treatment are complete.  I have prescribed you Proctofoam for your hemorrhoid.  You may also use sitz bath's which I have attached information regarding this.  Follow-up if any symptoms persist or worsen.    ED Prescriptions     Medication Sig Dispense Auth. Provider   pramoxine (PROCTOFOAM) 1 % foam Place 1 Application rectally 3 (three) times daily as needed for anal itching, anal irritation or hemorrhoids. 15 g Teodora Medici, [place]      PDMP not reviewed this encounter.   Teodora Medici, [place] 07/03/22 1253

## 2022-07-03 NOTE — ED Triage Notes (Signed)
Pt presents to uc with co of vaginal irritation and nausea for 4 days. Concern for pregnancy and BV.

## 2022-07-04 ENCOUNTER — Telehealth (HOSPITAL_COMMUNITY): Payer: Self-pay | Admitting: Emergency Medicine

## 2022-07-04 LAB — CERVICOVAGINAL ANCILLARY ONLY
Bacterial Vaginitis (gardnerella): POSITIVE — AB
Candida Glabrata: NEGATIVE
Candida Vaginitis: NEGATIVE
Chlamydia: NEGATIVE
Comment: NEGATIVE
Comment: NEGATIVE
Comment: NEGATIVE
Comment: NEGATIVE
Comment: NEGATIVE
Comment: NORMAL
Neisseria Gonorrhea: NEGATIVE
Trichomonas: NEGATIVE

## 2022-07-04 MED ORDER — METRONIDAZOLE 500 MG PO TABS: 500.0000 mg | ORAL_TABLET | Freq: Two times a day (BID) | ORAL | 0 refills | Status: DC

## 2022-08-30 ENCOUNTER — Encounter (HOSPITAL_COMMUNITY): Payer: Self-pay

## 2022-08-30 ENCOUNTER — Inpatient Hospital Stay (HOSPITAL_COMMUNITY): Payer: Medicaid Other

## 2022-08-30 ENCOUNTER — Inpatient Hospital Stay (HOSPITAL_COMMUNITY)
Admission: AD | Admit: 2022-08-30 | Discharge: 2022-08-30 | Disposition: A | Discharge status: Home / Self Care | Payer: Medicaid Other | Attending: Obstetrics and Gynecology | Admitting: Obstetrics and Gynecology

## 2022-08-30 ENCOUNTER — Other Ambulatory Visit: Payer: Self-pay

## 2022-08-30 DIAGNOSIS — O26891 Other specified pregnancy related conditions, first trimester: Secondary | ICD-10-CM | POA: Diagnosis present

## 2022-08-30 DIAGNOSIS — Z3A01 Less than 8 weeks gestation of pregnancy: Secondary | ICD-10-CM | POA: Diagnosis not present

## 2022-08-30 DIAGNOSIS — R1030 Lower abdominal pain, unspecified: Secondary | ICD-10-CM | POA: Diagnosis not present

## 2022-08-30 DIAGNOSIS — O418X10 Other specified disorders of amniotic fluid and membranes, first trimester, not applicable or unspecified: Secondary | ICD-10-CM

## 2022-08-30 DIAGNOSIS — M549 Dorsalgia, unspecified: Secondary | ICD-10-CM | POA: Insufficient documentation

## 2022-08-30 DIAGNOSIS — O208 Other hemorrhage in early pregnancy: Secondary | ICD-10-CM | POA: Insufficient documentation

## 2022-08-30 DIAGNOSIS — R109 Unspecified abdominal pain: Secondary | ICD-10-CM

## 2022-08-30 DIAGNOSIS — O26899 Other specified pregnancy related conditions, unspecified trimester: Secondary | ICD-10-CM

## 2022-08-30 LAB — WET PREP, GENITAL
Clue Cells Wet Prep HPF POC: NONE SEEN
Sperm: NONE SEEN
Trich, Wet Prep: NONE SEEN
WBC, Wet Prep HPF POC: <10 (ref <10)
Yeast Wet Prep HPF POC: NONE SEEN

## 2022-08-30 LAB — CBC
HCT: 35.9 % — ABNORMAL LOW (ref 36.0–46.0)
Hemoglobin: 11.9 g/dL — ABNORMAL LOW (ref 12.0–15.0)
MCH: 28.4 pg (ref 26.0–34.0)
MCHC: 33.1 g/dL (ref 30.0–36.0)
MCV: 85.7 fL (ref 80.0–100.0)
Platelets: 309 10*3/uL (ref 150–400)
RBC: 4.19 MIL/uL (ref 3.87–5.11)
RDW: 12.8 % (ref 11.5–15.5)
WBC: 5.6 10*3/uL (ref 4.0–10.5)
nRBC: 0 % (ref 0.0–0.2)

## 2022-08-30 LAB — URINALYSIS, ROUTINE W REFLEX MICROSCOPIC
Bilirubin Urine: NEGATIVE
Glucose, UA: NEGATIVE mg/dL
Hgb urine dipstick: NEGATIVE
Ketones, ur: NEGATIVE mg/dL
Leukocytes,Ua: NEGATIVE
Nitrite: NEGATIVE
Protein, ur: NEGATIVE mg/dL
Specific Gravity, Urine: 1.017 (ref 1.005–1.030)
pH: 6 (ref 5.0–8.0)

## 2022-08-30 LAB — POCT PREGNANCY, URINE: Preg Test, Ur: POSITIVE — AB

## 2022-08-30 LAB — HCG, QUANTITATIVE, PREGNANCY: hCG, Beta Chain, Quant, S: 11654 m[IU]/mL — ABNORMAL HIGH (ref <5)

## 2022-08-30 NOTE — MAU Provider Note (Signed)
History     CSN: [id number]  Arrival date and time: 08/30/22 1740   None     Chief Complaint  Patient presents with   Abdominal Pain   HPI Melanie Hancock is a 27 y.o. [id number] at [redacted]w[redacted]d by LMP who presents to MAU for lower abdominal cramping and back pain. Patient reports ongoing pain for the last 3 days however worse today. Pain is constant, however gets more intense at times. Sleeping makes the pain better. She denies vaginal bleeding, discharge, itching, odor, or urinary s/s. She reports a history of a miscarriage last year and pain is similar to that and was concerned and wanted to be evaluated. LMP 11/25.  Patient receives care at [facility].    OB History     Gravida  6   Para  4   Term  4   Preterm      AB  1   Living  4      SAB  0   IAB  1   Ectopic      Multiple  0   Live Births  4           Past Medical History:  Diagnosis Date   Anemia    Anxiety    Blood transfusion without reported diagnosis    Chronic back pain    Depression    GERD (gastroesophageal reflux disease)    Headache    Infection    UTI   Post partum depression    Pyelonephritis    UTI (lower urinary tract infection)     Past Surgical History:  Procedure Laterality Date   DILATION AND CURETTAGE OF UTERUS     INDUCED ABORTION     WISDOM TOOTH EXTRACTION      Family History  Problem Relation Age of Onset   Arthritis Mother    Healthy Father    Arthritis Maternal Grandmother    Asthma Son    Alcohol abuse Neg Hx     Social History   Tobacco Use   Smoking status: Former    Types: Cigarettes    Quit date: 01/21/2014    Years since quitting: 8.6   Smokeless tobacco: Never  Vaping Use   Vaping Use: Former   Substances: Nicotine, Flavoring  Substance Use Topics   Alcohol use: Not Currently    Comment: rarely    Allergies: No Known Allergies  Medications Prior to Admission  Medication Sig Dispense Refill Last Dose   metroNIDAZOLE (FLAGYL) 500 MG tablet Take  1 tablet (500 mg total) by mouth 2 (two) times daily. 14 tablet 0    ondansetron (ZOFRAN-ODT) 4 MG disintegrating tablet Take 1 tablet (4 mg total) by mouth every 8 (eight) hours as needed for nausea or vomiting. 20 tablet 0    pramoxine (PROCTOFOAM) 1 % foam Place 1 Application rectally 3 (three) times daily as needed for anal itching, anal irritation or hemorrhoids. 15 g 0     Review of Systems  Constitutional: Negative.   Respiratory: Negative.    Cardiovascular: Negative.   Gastrointestinal:  Positive for abdominal pain.  Genitourinary: Negative.   Musculoskeletal:  Positive for back pain.  Neurological: Negative.    Physical Exam   Blood pressure 114/69, pulse 68, temperature 99.2 F (37.3 C), temperature source Oral, resp. rate 16, height 5\' 5"  (1.651 m), weight 77.8 kg, last menstrual period 07/14/2022, SpO2 100 %.  Physical Exam Vitals and nursing note reviewed.  Constitutional:  General: She is not in acute distress. Cardiovascular:     Rate and Rhythm: Normal rate.  Pulmonary:     Effort: Pulmonary effort is normal.  Abdominal:     Palpations: Abdomen is soft.     Tenderness: There is no abdominal tenderness.  Genitourinary:    Comments: Patient collected blind swabs Skin:    General: Skin is warm and dry.  Neurological:     General: No focal deficit present.     Mental Status: She is alert and oriented to person, place, and time.  Psychiatric:        Mood and Affect: Mood normal.        Behavior: Behavior normal.    Results for orders placed or performed during the hospital encounter of 08/30/22 (from the past 24 hour(s))  Pregnancy, urine POC     Status: Abnormal   Collection Time: 08/30/22  5:57 PM  Result Value Ref Range   Preg Test, Ur POSITIVE (A) NEGATIVE  Urinalysis, Routine w reflex microscopic Urine, Clean Catch     Status: Abnormal   Collection Time: 08/30/22  5:59 PM  Result Value Ref Range   Color, Urine YELLOW YELLOW   APPearance HAZY (A)  CLEAR   Specific Gravity, Urine 1.017 1.005 - 1.030   pH 6.0 5.0 - 8.0   Glucose, UA NEGATIVE NEGATIVE mg/dL   Hgb urine dipstick NEGATIVE NEGATIVE   Bilirubin Urine NEGATIVE NEGATIVE   Ketones, ur NEGATIVE NEGATIVE mg/dL   Protein, ur NEGATIVE NEGATIVE mg/dL   Nitrite NEGATIVE NEGATIVE   Leukocytes,Ua NEGATIVE NEGATIVE  hCG, quantitative, pregnancy     Status: Abnormal   Collection Time: 08/30/22  6:41 PM  Result Value Ref Range   hCG, Beta Chain, Quant, S 11,654 (H) <5 mIU/mL  CBC     Status: Abnormal   Collection Time: 08/30/22  6:41 PM  Result Value Ref Range   WBC 5.6 4.0 - 10.5 K/uL   RBC 4.19 3.87 - 5.11 MIL/uL   Hemoglobin 11.9 (L) 12.0 - 15.0 g/dL   HCT 71.0 (L) 62.6 - 94.8 %   MCV 85.7 80.0 - 100.0 fL   MCH 28.4 26.0 - 34.0 pg   MCHC 33.1 30.0 - 36.0 g/dL   RDW 54.6 27.0 - 35.0 %   Platelets 309 150 - 400 K/uL   nRBC 0.0 0.0 - 0.2 %  Wet prep, genital     Status: None   Collection Time: 08/30/22  8:51 PM  Result Value Ref Range   Yeast Wet Prep HPF POC NONE SEEN NONE SEEN   Trich, Wet Prep NONE SEEN NONE SEEN   Clue Cells Wet Prep HPF POC NONE SEEN NONE SEEN   WBC, Wet Prep HPF POC <10 <10   Sperm NONE SEEN    US OB LESS THAN 14 WEEKS WITH OB TRANSVAGINAL  Result Date: 08/30/2022 CLINICAL DATA:  Cramping EXAM: OBSTETRIC <14 WK [place] AND TRANSVAGINAL OB US TECHNIQUE: Both transabdominal and transvaginal ultrasound examinations were performed for complete evaluation of the gestation as well as the maternal uterus, adnexal regions, and pelvic cul-de-sac. Transvaginal technique was performed to assess early pregnancy. COMPARISON:  None Available. FINDINGS: Intrauterine gestational sac: Single Yolk sac:  Visualized. Embryo:  Visualized. Cardiac Activity: Visualized. Heart Rate: 170 bpm CRL:  3.8 mm   6 w   1 d                  [place] EDC: 04/24/2023 Subchorionic hemorrhage:  Moderate subchorionic  hemorrhage. Maternal uterus/adnexae: Normal appearance of the bilateral ovaries.  IMPRESSION: 1. Single viable intrauterine pregnancy with estimated gestational age of [redacted] weeks 1 day. 2. Moderate subchorionic hemorrhage. Electronically Signed   By: Yetta Glassman M.D.   On: 08/30/2022 20:29    MAU Course  Procedures  MDM UA, CBC, HCG  Wet prep, GC/CT [place]   Labs unremarkable. UA normal. Wet prep and GC/CT collected however there was a delay in the lab so patient will be contacted at later time regarding results. US shows SIUP with FHR present. A subchorionic hematoma was also noted. Reviewed warning signs and return precautions.   Assessment and Plan  [redacted] weeks gestation of pregnancy Abdominal pain affecting pregnancy Subchorionic hemorrhage   - Discharge home in stable condition - Strict return precautions. Return to MAU as needed for new/worsening symptoms - Call [facility] to establish [place], CNM 08/30/2022, 9:04 PM

## 2022-08-30 NOTE — Discharge Instructions (Signed)

## 2022-08-30 NOTE — MAU Note (Signed)
Melanie Hancock is a 27 y.o. at [redacted]w[redacted]d here in MAU reporting: cramping for the past 3 days. States she had to leave work early today. Also feeling some pressure. Hx of miscarriage. No bleeding or discharge.   LMP: 07/14/22  Onset of complaint: 3 days  Pain score: 6/10  Vitals:   08/30/22 1818  BP: 114/69  Pulse: 68  Resp: 16  Temp: 99.2 F (37.3 C)  SpO2: 100%     FHT:NA  Lab orders placed from triage: ua, upt

## 2022-08-31 LAB — GC/CHLAMYDIA PROBE AMP (~~LOC~~) NOT AT ARMC
Chlamydia: NEGATIVE
Comment: NEGATIVE
Comment: NORMAL
Neisseria Gonorrhea: NEGATIVE

## 2022-09-10 ENCOUNTER — Encounter (HOSPITAL_COMMUNITY): Payer: Self-pay | Admitting: Obstetrics and Gynecology

## 2022-09-10 ENCOUNTER — Other Ambulatory Visit: Payer: Self-pay

## 2022-09-10 ENCOUNTER — Inpatient Hospital Stay (HOSPITAL_COMMUNITY)
Admission: AD | Admit: 2022-09-10 | Discharge: 2022-09-10 | Disposition: A | Discharge status: Home / Self Care | Payer: Medicaid Other | Attending: Obstetrics and Gynecology | Admitting: Obstetrics and Gynecology

## 2022-09-10 DIAGNOSIS — O219 Vomiting of pregnancy, unspecified: Secondary | ICD-10-CM | POA: Diagnosis not present

## 2022-09-10 DIAGNOSIS — O99281 Endocrine, nutritional and metabolic diseases complicating pregnancy, first trimester: Secondary | ICD-10-CM | POA: Diagnosis not present

## 2022-09-10 DIAGNOSIS — Z8632 Personal history of gestational diabetes: Secondary | ICD-10-CM | POA: Insufficient documentation

## 2022-09-10 DIAGNOSIS — Z3A08 8 weeks gestation of pregnancy: Secondary | ICD-10-CM | POA: Diagnosis not present

## 2022-09-10 DIAGNOSIS — K219 Gastro-esophageal reflux disease without esophagitis: Secondary | ICD-10-CM | POA: Insufficient documentation

## 2022-09-10 DIAGNOSIS — O218 Other vomiting complicating pregnancy: Secondary | ICD-10-CM | POA: Diagnosis not present

## 2022-09-10 DIAGNOSIS — O99611 Diseases of the digestive system complicating pregnancy, first trimester: Secondary | ICD-10-CM | POA: Insufficient documentation

## 2022-09-10 DIAGNOSIS — O26891 Other specified pregnancy related conditions, first trimester: Secondary | ICD-10-CM

## 2022-09-10 DIAGNOSIS — E860 Dehydration: Secondary | ICD-10-CM | POA: Insufficient documentation

## 2022-09-10 LAB — COMPREHENSIVE METABOLIC PANEL
ALT: 28 U/L (ref 0–44)
AST: 21 U/L (ref 15–41)
Albumin: 3.9 g/dL (ref 3.5–5.0)
Alkaline Phosphatase: 75 U/L (ref 38–126)
Anion gap: 8 (ref 5–15)
BUN: 7 mg/dL (ref 6–20)
CO2: 23 mmol/L (ref 22–32)
Calcium: 9.2 mg/dL (ref 8.9–10.3)
Chloride: 102 mmol/L (ref 98–111)
Creatinine, Ser: 0.8 mg/dL (ref 0.44–1.00)
GFR, Estimated: >60 mL/min (ref >60)
Glucose, Bld: 84 mg/dL (ref 70–99)
Potassium: 3.2 mmol/L — ABNORMAL LOW (ref 3.5–5.1)
Sodium: 133 mmol/L — ABNORMAL LOW (ref 135–145)
Total Bilirubin: 0.5 mg/dL (ref 0.3–1.2)
Total Protein: 7.4 g/dL (ref 6.5–8.1)

## 2022-09-10 LAB — CBC WITH DIFFERENTIAL/PLATELET
Abs Immature Granulocytes: 0.01 10*3/uL (ref 0.00–0.07)
Basophils Absolute: 0 10*3/uL (ref 0.0–0.1)
Basophils Relative: 1 %
Eosinophils Absolute: 0.5 10*3/uL (ref 0.0–0.5)
Eosinophils Relative: 8 %
HCT: 36.9 % (ref 36.0–46.0)
Hemoglobin: 12.3 g/dL (ref 12.0–15.0)
Immature Granulocytes: 0 %
Lymphocytes Relative: 41 %
Lymphs Abs: 2.2 10*3/uL (ref 0.7–4.0)
MCH: 28 pg (ref 26.0–34.0)
MCHC: 33.3 g/dL (ref 30.0–36.0)
MCV: 83.9 fL (ref 80.0–100.0)
Monocytes Absolute: 0.3 10*3/uL (ref 0.1–1.0)
Monocytes Relative: 6 %
Neutro Abs: 2.4 10*3/uL (ref 1.7–7.7)
Neutrophils Relative %: 44 %
Platelets: 346 10*3/uL (ref 150–400)
RBC: 4.4 MIL/uL (ref 3.87–5.11)
RDW: 12.6 % (ref 11.5–15.5)
WBC: 5.5 10*3/uL (ref 4.0–10.5)
nRBC: 0 % (ref 0.0–0.2)

## 2022-09-10 LAB — URINALYSIS, ROUTINE W REFLEX MICROSCOPIC
Bilirubin Urine: NEGATIVE
Glucose, UA: NEGATIVE mg/dL
Hgb urine dipstick: NEGATIVE
Ketones, ur: 20 mg/dL — AB
Leukocytes,Ua: NEGATIVE
Nitrite: NEGATIVE
Protein, ur: NEGATIVE mg/dL
Specific Gravity, Urine: 1.008 (ref 1.005–1.030)
pH: 7 (ref 5.0–8.0)

## 2022-09-10 MED ORDER — METOCLOPRAMIDE HCL 5 MG/ML IJ SOLN
10.0000 mg | Freq: Once | INTRAMUSCULAR | Status: AC
  Administered 2022-09-10: 10 mg via INTRAVENOUS
  Filled 2022-09-10: qty 2

## 2022-09-10 MED ORDER — LACTATED RINGERS IV BOLUS
1000.0000 mL | Freq: Once | INTRAVENOUS | Status: AC
  Administered 2022-09-10: 1000 mL via INTRAVENOUS

## 2022-09-10 MED ORDER — FAMOTIDINE IN NACL 20-0.9 MG/50ML-% IV SOLN
20.0000 mg | Freq: Once | INTRAVENOUS | Status: AC
  Administered 2022-09-10: 20 mg via INTRAVENOUS
  Filled 2022-09-10: qty 50

## 2022-09-10 MED ORDER — DIPHENHYDRAMINE HCL 50 MG/ML IJ SOLN
25.0000 mg | Freq: Once | INTRAMUSCULAR | Status: AC
  Administered 2022-09-10: 25 mg via INTRAVENOUS
  Filled 2022-09-10: qty 1

## 2022-09-10 MED ORDER — FAMOTIDINE 20 MG PO TABS: 20.0000 mg | ORAL_TABLET | Freq: Every day | ORAL | 0 refills | Status: DC

## 2022-09-10 MED ORDER — METOCLOPRAMIDE HCL 10 MG PO TABS: 10.0000 mg | ORAL_TABLET | Freq: Three times a day (TID) | ORAL | 0 refills | Status: DC | PRN

## 2022-09-10 MED ORDER — SODIUM CHLORIDE 0.9 % IV SOLN
8.0000 mg | Freq: Once | INTRAVENOUS | Status: AC
  Administered 2022-09-10: 8 mg via INTRAVENOUS
  Filled 2022-09-10: qty 4

## 2022-09-10 NOTE — MAU Note (Signed)
Melanie Hancock is a 27 y.o. at [redacted]w[redacted]d here in MAU reporting: nausea and dry heaving, reports hasn't been vomiting but unable to eat.  States last meal was Thursday.  Denies VB or abdominal pain.  Reports has a HA, hasn't treated with meds.   LMP: NA Onset of complaint: Thursday Pain score: 8 Vitals:   09/10/22 1838  BP: 112/60  Pulse: 66  Resp: 19  Temp: 98.3 F (36.8 C)  SpO2: 100%     FHT:NA Lab orders placed from triage:   UA

## 2022-09-10 NOTE — MAU Provider Note (Addendum)
History     [id number]  Arrival date and time: 09/10/22 1817    Chief Complaint  Patient presents with   Nausea   Emesis     HPI DAYONA SHAHEEN is a 27 y.o. [id number] at [redacted]w[redacted]d by LMP with PMHx notable for GERD, prior Hyperemesis Gravidarum and miscarriage in September of 2023, who presents for prolonged Nausea and vomiting. Pt states her symptoms began on Thursday, 09/06/22 and have persisted since. She explains she has been unable to eat w/o inducing vomiting since this time and that her last complete meal was on Wednesday 09/05/22. She endorses epigastric pain nausea, chills, body aches, constipation and HA. Denies Fever or diarrhea.   Pt explains her youngest daughter began experiencing fever and flu-like symptoms on Thursday 09/06/22. Pt has had a negative COVID test and does not have this seasons flu vaccine. She currently receives care at [facility].      OB History     Gravida  6   Para  4   Term  4   Preterm      AB  1   Living  4      SAB  0   IAB  1   Ectopic      Multiple  0   Live Births  4           Past Medical History:  Diagnosis Date   Anemia    Anxiety    Blood transfusion without reported diagnosis    Chronic back pain    Depression    GERD (gastroesophageal reflux disease)    Headache    Infection    UTI   Post partum depression    Pyelonephritis    UTI (lower urinary tract infection)     Past Surgical History:  Procedure Laterality Date   DILATION AND CURETTAGE OF UTERUS     INDUCED ABORTION     WISDOM TOOTH EXTRACTION      Family History  Problem Relation Age of Onset   Arthritis Mother    Healthy Father    Arthritis Maternal Grandmother    Asthma Son    Alcohol abuse Neg Hx     Social History   Socioeconomic History   Marital status: Single    Spouse name: Tuyen Uncapher   Number of children: 3   Years of education: Not on file   Highest education level: Associate degree: occupational, Scientist, product/process development, or  vocational program  Occupational History    Employer: [employer]  Tobacco Use   Smoking status: Former    Types: Cigarettes    Quit date: 01/21/2014    Years since quitting: 8.6   Smokeless tobacco: Never  Vaping Use   Vaping Use: Former   Substances: Nicotine, Flavoring  Substance and Sexual Activity   Alcohol use: Not Currently    Comment: rarely   Drug use: Not on file    Comment: 2-3 weeks ago   Sexual activity: Yes    Birth control/protection: Injection  Other Topics Concern   Not on file  Social History Narrative   Not on file   Social Determinants of Health   Financial Resource Strain: Not on file  Food Insecurity: Not on file  Transportation Needs: Not on file  Physical Activity: Not on file  Stress: Not on file  Social Connections: Not on file  Intimate Partner Violence: Not on file    No Known Allergies  No current facility-administered medications on file prior to  encounter.   Current Outpatient Medications on File Prior to Encounter  Medication Sig Dispense Refill   ondansetron (ZOFRAN-ODT) 4 MG disintegrating tablet Take 1 tablet (4 mg total) by mouth every 8 (eight) hours as needed for nausea or vomiting. 20 tablet 0   [DISCONTINUED] pantoprazole (PROTONIX) 20 MG tablet Take 1 tablet (20 mg total) by mouth daily. 30 tablet 2     Review of Systems  Constitutional:  Positive for chills and malaise/fatigue. Negative for fever.  HENT: Negative.    Eyes:  Negative for blurred vision, double vision and photophobia.  Cardiovascular:  Negative for leg swelling.  Gastrointestinal:  Positive for abdominal pain, constipation, heartburn, nausea and vomiting. Negative for diarrhea.  Neurological:  Positive for headaches. Negative for dizziness.   Pertinent positives and negative per HPI, all others reviewed and negative   Physical Exam   BP 112/60 (BP Location: Right Arm)   Pulse 66   Temp 98.3 F (36.8 C) (Oral)   Resp 19   Ht 5\' 5"  (1.651 m)   Wt 75.9  kg   LMP 07/14/2022   SpO2 100%   BMI 27.86 kg/m   Patient Vitals for the past 24 hrs:  BP Temp Temp src Pulse Resp SpO2 Height Weight  09/10/22 1838 112/60 98.3 F (36.8 C) Oral 66 19 100 % -- --  09/10/22 1832 -- -- -- -- -- -- 5\' 5"  (1.651 m) 75.9 kg    Physical Exam Constitutional:      Appearance: Normal appearance.  HENT:     Head: Normocephalic.  Eyes:     Extraocular Movements: Extraocular movements intact.  Cardiovascular:     Rate and Rhythm: Normal rate.  Pulmonary:     Effort: Pulmonary effort is normal.  Abdominal:     Tenderness: There is abdominal tenderness.  Musculoskeletal:     Cervical back: Normal range of motion.  Skin:    General: Skin is warm and dry.  Neurological:     General: No focal deficit present.     Mental Status: She is alert.  Psychiatric:        Mood and Affect: Mood normal.    Labs Results for orders placed or performed during the hospital encounter of 09/10/22 (from the past 24 hour(s))  Urinalysis, Routine w reflex microscopic Urine, Clean Catch     Status: Abnormal   Collection Time: 09/10/22  6:49 PM  Result Value Ref Range   Color, Urine YELLOW YELLOW   APPearance HAZY (A) CLEAR   Specific Gravity, Urine 1.008 1.005 - 1.030   pH 7.0 5.0 - 8.0   Glucose, UA NEGATIVE NEGATIVE mg/dL   Hgb urine dipstick NEGATIVE NEGATIVE   Bilirubin Urine NEGATIVE NEGATIVE   Ketones, ur 20 (A) NEGATIVE mg/dL   Protein, ur NEGATIVE NEGATIVE mg/dL   Nitrite NEGATIVE NEGATIVE   Leukocytes,Ua NEGATIVE NEGATIVE  CBC with Differential/Platelet     Status: None   Collection Time: 09/10/22  7:47 PM  Result Value Ref Range   WBC 5.5 4.0 - 10.5 K/uL   RBC 4.40 3.87 - 5.11 MIL/uL   Hemoglobin 12.3 12.0 - 15.0 g/dL   HCT 36.9 36.0 - 46.0 %   MCV 83.9 80.0 - 100.0 fL   MCH 28.0 26.0 - 34.0 pg   MCHC 33.3 30.0 - 36.0 g/dL   RDW 12.6 11.5 - 15.5 %   Platelets 346 150 - 400 K/uL   nRBC 0.0 0.0 - 0.2 %   Neutrophils Relative % 44 %  Neutro Abs  2.4 1.7 - 7.7 K/uL   Lymphocytes Relative 41 %   Lymphs Abs 2.2 0.7 - 4.0 K/uL   Monocytes Relative 6 %   Monocytes Absolute 0.3 0.1 - 1.0 K/uL   Eosinophils Relative 8 %   Eosinophils Absolute 0.5 0.0 - 0.5 K/uL   Basophils Relative 1 %   Basophils Absolute 0.0 0.0 - 0.1 K/uL   Immature Granulocytes 0 %   Abs Immature Granulocytes 0.01 0.00 - 0.07 K/uL    Imaging No results found.  MAU Course  Procedures Lab Orders         Urinalysis, Routine w reflex microscopic Urine, Clean Catch         CBC with Differential/Platelet         Comprehensive metabolic panel    Meds ordered this encounter  Medications   FOLLOWED BY Linked Order Group    lactated ringers bolus 1,000 mL    lactated ringers bolus 1,000 mL   ondansetron (ZOFRAN) 8 mg in sodium chloride 0.9 % 50 mL IVPB   famotidine (PEPCID) IVPB 20 mg premix   Imaging Orders  No imaging studies ordered today    MDM LR 1000 mL   IV Zofran 8mg  50 mL IV Famotidine   Assessment and Plan   Dispo:  Home in stable condition  , [facility] 09/10/22 8:42 PM     Attestation of Supervision of Student:  I confirm that I have verified the information documented in the physician assistant student's note and that I have also personally performed the history, physical exam and all medical decision making activities.  I have verified that all services and findings are accurately documented in this student's note; and I agree with management and plan as outlined in the documentation. I have also made any necessary editorial changes.  History MARKIE HEFFERNAN is a 27 y.o. 37 at [redacted]w[redacted]d who presents for nausea & vomiting. Symptoms started on Thursday. States she hasn't been able to eat since Thursday. Has been able to keep down ginger ale. Has taken zofran without relief. Reports hyperemesis with her previous pregnancy. Also reports she has GERD but isn't currently taken meds for it. Has had headache today that she  relates to dehydration. Hasn't treated headache.  Denies abdominal pain, vaginal bleeding, fever, or diarrhea.   Physical exam BP 112/60 (BP Location: Right Arm)   Pulse 66   Temp 98.3 F (36.8 C) (Oral)   Resp 19   Ht 5\' 5"  (1.651 m)   Wt 75.9 kg   LMP 07/14/2022   SpO2 100%   BMI 27.86 kg/m   Physical Examination: General appearance - alert, well appearing, and in no distress Mental status - normal mood, behavior, speech, dress, motor activity, and thought processes Eyes - pupils equal and reactive, extraocular eye movements intact, sclera anicteric Chest - normal respiratory effort  MDM Given IV fluids, zofran, & pepcid. Continues to have nausea & headache. Then given IV reglan & benadryl with complete resolution of symptoms & patient requesting to go home.   Assessment/Plan 1. Nausea and vomiting during pregnancy prior to [redacted] weeks gestation  -Continue zofran -Rx reglan  2. Gastroesophageal reflux disease without esophagitis  -Rx pepcid  3. Pregnancy headache in first trimester  -Can use reglan & tylenol for future headaches  4. [redacted] weeks gestation of pregnancy  -F/u with OB as scheduled.  -reviewed reasons to return to MAU    , NP [facility] 07/16/2022, [facility] 09/10/2022 10:54 PM

## 2022-09-10 NOTE — Discharge Instructions (Signed)
For prevention of migraines in pregnancy: -Magnesium, 400mg  by mouth, once daily -Vitamin B2, 400mg  by mouth, once daily  For treatment of migraines in pregnancy: -take medication at the first sign of the pain of a headache, or the first sign of your aura -start with 1000mg  Tylenol (or excedrin tension headache with acetaminophen & caffeine only, NOT aspirin) with or without Reglan 10mg  -Do not take more than 4000 mg of tylenol (acetaminophen) per day

## 2022-09-25 ENCOUNTER — Inpatient Hospital Stay (HOSPITAL_COMMUNITY)
Admission: AD | Admit: 2022-09-25 | Discharge: 2022-09-25 | Disposition: A | Discharge status: Home / Self Care | Payer: Medicaid Other | Attending: Obstetrics and Gynecology | Admitting: Obstetrics and Gynecology

## 2022-09-25 ENCOUNTER — Inpatient Hospital Stay (HOSPITAL_COMMUNITY): Payer: Medicaid Other

## 2022-09-25 ENCOUNTER — Encounter (HOSPITAL_COMMUNITY): Payer: Self-pay | Admitting: Obstetrics and Gynecology

## 2022-09-25 DIAGNOSIS — K429 Umbilical hernia without obstruction or gangrene: Secondary | ICD-10-CM | POA: Diagnosis not present

## 2022-09-25 DIAGNOSIS — Z9889 Other specified postprocedural states: Secondary | ICD-10-CM | POA: Diagnosis not present

## 2022-09-25 DIAGNOSIS — Z332 Encounter for elective termination of pregnancy: Secondary | ICD-10-CM | POA: Insufficient documentation

## 2022-09-25 DIAGNOSIS — R109 Unspecified abdominal pain: Secondary | ICD-10-CM

## 2022-09-25 LAB — URINALYSIS, ROUTINE W REFLEX MICROSCOPIC
Bacteria, UA: NONE SEEN
Bilirubin Urine: NEGATIVE
Glucose, UA: NEGATIVE mg/dL
Ketones, ur: NEGATIVE mg/dL
Leukocytes,Ua: NEGATIVE
Nitrite: NEGATIVE
Protein, ur: NEGATIVE mg/dL
Specific Gravity, Urine: 1.017 (ref 1.005–1.030)
pH: 6 (ref 5.0–8.0)

## 2022-09-25 LAB — CBC WITH DIFFERENTIAL/PLATELET
Abs Immature Granulocytes: 0.03 10*3/uL (ref 0.00–0.07)
Basophils Absolute: 0.1 10*3/uL (ref 0.0–0.1)
Basophils Relative: 1 %
Eosinophils Absolute: 0.5 10*3/uL (ref 0.0–0.5)
Eosinophils Relative: 7 %
HCT: 38.1 % (ref 36.0–46.0)
Hemoglobin: 12.7 g/dL (ref 12.0–15.0)
Immature Granulocytes: 0 %
Lymphocytes Relative: 30 %
Lymphs Abs: 2.2 10*3/uL (ref 0.7–4.0)
MCH: 29.4 pg (ref 26.0–34.0)
MCHC: 33.3 g/dL (ref 30.0–36.0)
MCV: 88.2 fL (ref 80.0–100.0)
Monocytes Absolute: 0.6 10*3/uL (ref 0.1–1.0)
Monocytes Relative: 8 %
Neutro Abs: 3.9 10*3/uL (ref 1.7–7.7)
Neutrophils Relative %: 54 %
Platelets: 347 10*3/uL (ref 150–400)
RBC: 4.32 MIL/uL (ref 3.87–5.11)
RDW: 13.2 % (ref 11.5–15.5)
WBC: 7.2 10*3/uL (ref 4.0–10.5)
nRBC: 0 % (ref 0.0–0.2)

## 2022-09-25 LAB — COMPREHENSIVE METABOLIC PANEL
ALT: 37 U/L (ref 0–44)
AST: 34 U/L (ref 15–41)
Albumin: 3.9 g/dL (ref 3.5–5.0)
Alkaline Phosphatase: 69 U/L (ref 38–126)
Anion gap: 6 (ref 5–15)
BUN: 8 mg/dL (ref 6–20)
CO2: 22 mmol/L (ref 22–32)
Calcium: 8.7 mg/dL — ABNORMAL LOW (ref 8.9–10.3)
Chloride: 105 mmol/L (ref 98–111)
Creatinine, Ser: 0.81 mg/dL (ref 0.44–1.00)
GFR, Estimated: >60 mL/min (ref >60)
Glucose, Bld: 88 mg/dL (ref 70–99)
Potassium: 3.8 mmol/L (ref 3.5–5.1)
Sodium: 133 mmol/L — ABNORMAL LOW (ref 135–145)
Total Bilirubin: 0.4 mg/dL (ref 0.3–1.2)
Total Protein: 7 g/dL (ref 6.5–8.1)

## 2022-09-25 MED ORDER — FAMOTIDINE IN NACL 20-0.9 MG/50ML-% IV SOLN
20.0000 mg | Freq: Once | INTRAVENOUS | Status: AC
  Administered 2022-09-25: 20 mg via INTRAVENOUS
  Filled 2022-09-25: qty 50

## 2022-09-25 MED ORDER — LACTATED RINGERS IV BOLUS
1000.0000 mL | Freq: Once | INTRAVENOUS | Status: AC
  Administered 2022-09-25: 1000 mL via INTRAVENOUS

## 2022-09-25 MED ORDER — SODIUM CHLORIDE 0.9 % IV BOLUS: 250.0000 mL | Freq: Once | INTRAVENOUS | Status: DC

## 2022-09-25 MED ORDER — HYDROMORPHONE HCL 1 MG/ML IJ SOLN
1.0000 mg | Freq: Once | INTRAMUSCULAR | Status: AC
  Administered 2022-09-25: 1 mg via INTRAVENOUS
  Filled 2022-09-25: qty 1

## 2022-09-25 MED ORDER — PROMETHAZINE HCL 25 MG PO TABS: 25.0000 mg | ORAL_TABLET | Freq: Four times a day (QID) | ORAL | 0 refills | Status: DC | PRN

## 2022-09-25 NOTE — MAU Note (Signed)
.  Melanie Hancock is a 27 y.o. at [redacted]w[redacted]d here in MAU reporting: [facility]. Has upper abdominal pain "in between ribs and belly button" that is "achy and burning" along with lower abdominal pain that is sharp. Pain started around 2300 last night - took ibuprofen, warm bath, and used heating pad and was able to doze off. Woke up and called EMS around 0430 to pick her up. Denies VB or abnormal discharge. Reports nausea, but no vomiting. States upper abdominal pain is bothering her the most.   Pain score: 10 - upper abdomen; 6 - lower abd Vitals:   09/25/22 0545  BP: 123/72  Pulse: 66  Resp: 17  Temp: 98.2 F (36.8 C)  SpO2: 99%     FHT: NA  Lab orders placed from triage:  UA

## 2022-09-25 NOTE — MAU Provider Note (Addendum)
History     CSN: [id number]  Arrival date and time: 09/25/22 0522   Event Date/Time   First Provider Initiated Contact with Patient 09/25/22 [id number]      Chief Complaint  Patient presents with   Abdominal Pain   Melanie Hancock , a  27 y.o. [id number]  presents to MAU via EMS with complaints of abdominal pain. On 2/5 patient had surgical abortion at [facility] @ 130pm . She reports she had been having lower abdominal pain since the procedure, but worsened this evening around 11pm. She reports that she took ibuprofen and Pepcid around that time and used a heating pad and was able to rest. At 4:30am patient states she woke up "in worse pain and feeling nauseated". She reports pain from lower sternum to about 2-3 inches above the umbilicus and right lower quadrant pain. Patient states upper belly pain is constant and feels like "burning and achy." Currently rating pain 10/10. She reports that right lower quadrant pain is also constant, but waxes and wanes and currently rates that pain a 8/10. She denies vaginal bleeding, abnormal vaginal discharge. Also denies constipation. Last BM was 2/5.   Patient vomited In triage. Patient states she feels "less full" but is still in pain and still having nausea. Patient states she also feels light headed and dizzy.         OB History     Gravida  6   Para  4   Term  4   Preterm      AB  1   Living  4      SAB  0   IAB  1   Ectopic      Multiple  0   Live Births  4           Past Medical History:  Diagnosis Date   Anemia    Anxiety    Blood transfusion without reported diagnosis    Chronic back pain    Depression    GERD (gastroesophageal reflux disease)    Headache    Post partum depression    Pyelonephritis    UTI (lower urinary tract infection)     Past Surgical History:  Procedure Laterality Date   DILATION AND CURETTAGE OF UTERUS     INDUCED ABORTION     WISDOM TOOTH EXTRACTION      Family History   Problem Relation Age of Onset   Arthritis Mother    Healthy Father    Arthritis Maternal Grandmother    Asthma Son    Alcohol abuse Neg Hx     Social History   Tobacco Use   Smoking status: Former    Types: Cigarettes    Quit date: 01/21/2014    Years since quitting: 8.6   Smokeless tobacco: Never  Vaping Use   Vaping Use: Former   Substances: Nicotine, Flavoring  Substance Use Topics   Alcohol use: Not Currently    Comment: rarely    Allergies: No Known Allergies  Medications Prior to Admission  Medication Sig Dispense Refill Last Dose   acetaminophen (TYLENOL) 500 MG tablet Take 2 tablets every 6 hours by oral route for 7 days.      cyclobenzaprine (FLEXERIL) 5 MG tablet TAKE 1 TABLET BY MOUTH THREE TIMES A DAY FOR 7 DAYS      famotidine (PEPCID) 20 MG tablet Take 1 tablet (20 mg total) by mouth daily. 30 tablet 0    fluticasone (FLONASE) 50 MCG/ACT nasal  spray INSTILL 2 SPRAYS BY INTRANASAL ROUTE EVERY DAY      guaiFENesin (MUCINEX) 600 MG 12 hr tablet Take 1 tablet every 12 hours by oral route for 7 days.      metoCLOPramide (REGLAN) 10 MG tablet Take 1 tablet (10 mg total) by mouth every 8 (eight) hours as needed for nausea (or headache). 30 tablet 0    ondansetron (ZOFRAN-ODT) 4 MG disintegrating tablet Take 1 tablet (4 mg total) by mouth every 8 (eight) hours as needed for nausea or vomiting. 20 tablet 0    oxyCODONE (OXY IR/ROXICODONE) 5 MG immediate release tablet Take 1 tablet every 6 hours by oral route as needed.      promethazine (PHENERGAN) 25 MG suppository PLACE 1 SUPPOSITORY (25 MG TOTAL) RECTALLY EVERY 6 (SIX) HOURS AS NEEDED FOR NAUSEA OR VOMITING.      valACYclovir (VALTREX) 500 MG tablet TAKE 1 TABLET TWICE A DAY FOR 5 DAYS FOR OUTBREAKS       Review of Systems  Constitutional:  Positive for diaphoresis. Negative for chills, fatigue and fever.  Respiratory:  Negative for apnea, shortness of breath and wheezing.   Cardiovascular:  Negative for chest pain  and palpitations.  Gastrointestinal:  Positive for abdominal pain, nausea and vomiting. Negative for constipation and diarrhea.  Genitourinary:  Positive for pelvic pain. Negative for difficulty urinating, dysuria, vaginal bleeding, vaginal discharge and vaginal pain.  Musculoskeletal:  Negative for back pain.  Neurological:  Positive for weakness. Negative for seizures and headaches.  Psychiatric/Behavioral:  Negative for suicidal ideas.    Physical Exam   Blood pressure 123/72, pulse 66, temperature 98.2 F (36.8 C), temperature source Oral, resp. rate 17, height 5\' 5"  (1.651 m), weight 78.7 kg, last menstrual period 07/14/2022, SpO2 99 %.  Physical Exam Vitals and nursing note reviewed.  Constitutional:      Appearance: She is ill-appearing and diaphoretic.  Cardiovascular:     Rate and Rhythm: Normal rate.  Pulmonary:     Effort: Pulmonary effort is normal.  Abdominal:     Palpations: Abdomen is soft.     Tenderness: There is abdominal tenderness in the right lower quadrant and epigastric area. There is guarding.     Comments: Very mild rebound tenderness with palpation   Skin:    General: Skin is warm.     Capillary Refill: Capillary refill takes less than 2 seconds.     Coloration: Skin is pale.  Neurological:     Mental Status: She is alert.    MAU Course  Procedures Orders Placed This Encounter  Procedures   US PELVIS (TRANSABDOMINAL ONLY)   CT ABDOMEN PELVIS WO CONTRAST   Urinalysis, Routine w reflex microscopic -Urine, Clean Catch   CBC with Differential/Platelet   Comprehensive metabolic panel   Results for orders placed or performed during the hospital encounter of 09/25/22 (from the past 24 hour(s))  Urinalysis, Routine w reflex microscopic -Urine, Clean Catch     Status: Abnormal   Collection Time: 09/25/22  5:59 AM  Result Value Ref Range   Color, Urine YELLOW YELLOW   APPearance CLEAR CLEAR   Specific Gravity, Urine 1.017 1.005 - 1.030   pH 6.0 5.0 -  8.0   Glucose, UA NEGATIVE NEGATIVE mg/dL   Hgb urine dipstick MODERATE (A) NEGATIVE   Bilirubin Urine NEGATIVE NEGATIVE   Ketones, ur NEGATIVE NEGATIVE mg/dL   Protein, ur NEGATIVE NEGATIVE mg/dL   Nitrite NEGATIVE NEGATIVE   Leukocytes,Ua NEGATIVE NEGATIVE   RBC /  HPF 0-5 0 - 5 RBC/hpf   WBC, UA 0-5 0 - 5 WBC/hpf   Bacteria, UA NONE SEEN NONE SEEN   Squamous Epithelial / HPF 0-5 0 - 5 /HPF   Mucus PRESENT   CBC with Differential/Platelet     Status: None   Collection Time: 09/25/22  6:49 AM  Result Value Ref Range   WBC 7.2 4.0 - 10.5 K/uL   RBC 4.32 3.87 - 5.11 MIL/uL   Hemoglobin 12.7 12.0 - 15.0 g/dL   HCT 73.7 10.6 - 26.9 %   MCV 88.2 80.0 - 100.0 fL   MCH 29.4 26.0 - 34.0 pg   MCHC 33.3 30.0 - 36.0 g/dL   RDW 48.5 46.2 - 70.3 %   Platelets 347 150 - 400 K/uL   nRBC 0.0 0.0 - 0.2 %   Neutrophils Relative % 54 %   Neutro Abs 3.9 1.7 - 7.7 K/uL   Lymphocytes Relative 30 %   Lymphs Abs 2.2 0.7 - 4.0 K/uL   Monocytes Relative 8 %   Monocytes Absolute 0.6 0.1 - 1.0 K/uL   Eosinophils Relative 7 %   Eosinophils Absolute 0.5 0.0 - 0.5 K/uL   Basophils Relative 1 %   Basophils Absolute 0.1 0.0 - 0.1 K/uL   Immature Granulocytes 0 %   Abs Immature Granulocytes 0.03 0.00 - 0.07 K/uL  Comprehensive metabolic panel     Status: Abnormal   Collection Time: 09/25/22  6:49 AM  Result Value Ref Range   Sodium 133 (L) 135 - 145 mmol/L   Potassium 3.8 3.5 - 5.1 mmol/L   Chloride 105 98 - 111 mmol/L   CO2 22 22 - 32 mmol/L   Glucose, Bld 88 70 - 99 mg/dL   BUN 8 6 - 20 mg/dL   Creatinine, Ser 5.00 0.44 - 1.00 mg/dL   Calcium 8.7 (L) 8.9 - 10.3 mg/dL   Total Protein 7.0 6.5 - 8.1 g/dL   Albumin 3.9 3.5 - 5.0 g/dL   AST 34 15 - 41 U/L   ALT 37 0 - 44 U/L   Alkaline Phosphatase 69 38 - 126 U/L   Total Bilirubin 0.4 0.3 - 1.2 mg/dL   GFR, Estimated >93 >81 mL/min   Anion gap 6 5 - 15   CT ABDOMEN PELVIS WO CONTRAST  Result Date: 09/25/2022 CLINICAL DATA:  Right lower quadrant  abdominal pain following surgical abortion EXAM: CT ABDOMEN AND PELVIS WITHOUT CONTRAST TECHNIQUE: Multidetector CT imaging of the abdomen and pelvis was performed following the standard protocol without IV contrast. RADIATION DOSE REDUCTION: This exam was performed according to the departmental dose-optimization program which includes automated exposure control, adjustment of the mA and/or kV according to patient size and/or use of iterative reconstruction technique. COMPARISON:  Pelvis ultrasound 09/25/2022, CT 10/12/2018 FINDINGS: Lower chest: Included lung bases are clear.  Heart size is normal. Hepatobiliary: Unremarkable unenhanced appearance of the liver. No focal liver lesion identified. Gallbladder within normal limits. No hyperdense gallstone. No biliary dilatation. Pancreas: Unremarkable. No pancreatic ductal dilatation or surrounding inflammatory changes. Spleen: Normal in size without focal abnormality. Adrenals/Urinary Tract: Adrenal glands are unremarkable. Kidneys are normal, without renal calculi, focal lesion, or hydronephrosis. Bladder is unremarkable. Stomach/Bowel: Stomach is within normal limits. Appendix appears normal (series 3, image 49). No evidence of bowel wall thickening, distention, or inflammatory changes. Vascular/Lymphatic: No significant vascular findings are present. No enlarged abdominal or pelvic lymph nodes. Reproductive: Anteverted uterus which is enlarged. There is heterogeneously hyperattenuating material in  the endometrium, better characterized by same-day pelvic ultrasound. No adnexal abnormality. Other: No free fluid. No abdominopelvic fluid collection. No pneumoperitoneum. Small fat containing umbilical hernia. Musculoskeletal: No acute or significant osseous findings. IMPRESSION: 1. No acute abdominopelvic findings. Normal appendix. 2. Heterogeneously thickened appearance of the endometrium, better characterized by same-day pelvic ultrasound. 3. Small fat containing  umbilical hernia. Electronically Signed   By: Davina Poke D.O.   On: 09/25/2022 09:13   US PELVIS (TRANSABDOMINAL ONLY)  Result Date: 09/25/2022 CLINICAL DATA:  Abdominal cramps. EXAM: TRANSABDOMINAL ULTRASOUND OF PELVIS TECHNIQUE: Transabdominal ultrasound examination of the pelvis was performed including evaluation of the uterus, ovaries, adnexal regions, and pelvic cul-de-sac. COMPARISON:  Ob ultrasound 08/30/2022. FINDINGS: Uterus Measurements: 12.4 x 6.6 x 7.1 cm = volume: 304.3 mL. No fibroids or other mass visualized. Endometrium Thickness: 18 mm. Thickened irregular endometrium. Minimal amount of endometrial canal fluid noted. These findings may be related to recent pregnancy and or retained products of conception. No intrauterine pregnancy noted on today's exam. Follow-up pelvic ultrasound is suggested to demonstrate resolution. Right ovary Measurements: 2.9 x 2.6 x 2.2 cm. Normal appearance/no adnexal mass. Left ovary Measurements: 2.2 x 2.2 x 2.0 cm. Normal appearance/no adnexal mass. Other findings:  No abnormal free fluid. IMPRESSION: Thickened irregular endometrium at 18 mm. Minimal amount of endometrial canal fluid noted. These findings may be related to recent pregnancy and or retained products of conception. No intrauterine pregnancy noted on today's exam. Follow-up pelvic ultrasound is suggested to demonstrate resolution of these findings. Electronically Signed   By: Marcello Moores  Register M.D.   On: 09/25/2022 07:48     MDM - CBC normal, no WBC  - UA positive for blood otherwise normal   - Transfer of care to D. Moshe Cipro, CNM @ 8:12 AM  Shantonette Isaias Sakai) Rollene Rotunda, MSN, [place] [facility] [place] [facility] [place]  09/25/22 8:12 AM   Assumed care of patient at 0815. Labs and imaging reviewed. Patient waiting for go to CT.   0940: Normal CT. No acute abdominopelvic findings. Patient reports pain improved after being given medications by previous provider. Reports she was able to sleep. Continues  to have some nausea, but no vomiting. Reviewed labs/imaging with patient. Patient stable for discharge home. She does have a follow up scheduled on 2/19 with [facility]. Instructed patient to keep appointment.   Assessment and Plan  S/p TAB Abdominal pain  - Discharge home in stable condition - Rx for Phenergan sent - Return precautions reviewed. Return to MAU as needed - Keep follow up appointment as scheduled on 2/19   Renee Harder, [place] 09/25/22, 10:09 AM

## 2022-09-25 NOTE — MAU Note (Signed)
Pt to CT via stretcher by transport. IV saline locked prior to departure off unit.

## 2022-11-07 ENCOUNTER — Ambulatory Visit: Payer: Self-pay

## 2022-11-13 ENCOUNTER — Ambulatory Visit
Admission: RE | Admit: 2022-11-13 | Discharge: 2022-11-13 | Disposition: A | Discharge status: Home / Self Care | Payer: Medicaid Other | Source: Ambulatory Visit | Attending: Urgent Care | Admitting: Urgent Care

## 2022-11-13 ENCOUNTER — Ambulatory Visit (INDEPENDENT_AMBULATORY_CARE_PROVIDER_SITE_OTHER): Payer: Medicaid Other

## 2022-11-13 VITALS — BP 118/66 | HR 64 | Temp 99.2°F | Resp 16

## 2022-11-13 DIAGNOSIS — R8290 Unspecified abnormal findings in urine: Secondary | ICD-10-CM

## 2022-11-13 DIAGNOSIS — R109 Unspecified abdominal pain: Secondary | ICD-10-CM | POA: Diagnosis not present

## 2022-11-13 DIAGNOSIS — M5431 Sciatica, right side: Secondary | ICD-10-CM

## 2022-11-13 DIAGNOSIS — N898 Other specified noninflammatory disorders of vagina: Secondary | ICD-10-CM

## 2022-11-13 DIAGNOSIS — M25551 Pain in right hip: Secondary | ICD-10-CM | POA: Diagnosis not present

## 2022-11-13 LAB — POCT URINALYSIS DIP (MANUAL ENTRY)
Bilirubin, UA: NEGATIVE
Blood, UA: NEGATIVE
Glucose, UA: NEGATIVE mg/dL
Ketones, POC UA: NEGATIVE mg/dL
Leukocytes, UA: NEGATIVE
Nitrite, UA: NEGATIVE
Protein Ur, POC: NEGATIVE mg/dL
Spec Grav, UA: 1.025 (ref 1.010–1.025)
Urobilinogen, UA: 0.2 E.U./dL
pH, UA: 7 (ref 5.0–8.0)

## 2022-11-13 LAB — POCT URINE PREGNANCY: Preg Test, Ur: NEGATIVE

## 2022-11-13 MED ORDER — TIZANIDINE HCL 4 MG PO TABS: 4.0000 mg | ORAL_TABLET | Freq: Every day | ORAL | 0 refills | Status: DC

## 2022-11-13 MED ORDER — PREDNISONE 50 MG PO TABS: 50.0000 mg | ORAL_TABLET | Freq: Every day | ORAL | 0 refills | Status: DC

## 2022-11-13 NOTE — ED Provider Notes (Signed)
[facility]  Note:  This document was prepared using Systems analyst and may include unintentional dictation errors.  MRN: [id number] DOB: [birth year]  Subjective:   Melanie Hancock is a 27 y.o. female presenting for multiple chief complaints. Has had 4 day history of acute onset cloudy urine.  Has had sex with 1 female partner, did not use condoms for protection. Now has a new partner and is using protection. Has not been hydrating as well. Denies fever, n/v, abdominal pain, pelvic pain, rashes, dysuria, urinary frequency, hematuria, vaginal discharge.   Has had acute on chronic low back pain over the right side that radiates down into the thigh and lower leg on the right side.  Has a history of sciatica, bulging disc.  Has had multiple images done to look at the back including an MRI.  Has not followed up with a spine specialist in some time.  No recent fall, trauma.  No changes to bowel habits. Has focal tenderness over the right lateral hip.  No fall, trauma.  Was previously told that in pregnancy she had a hip issue but has not had persistent problems with it until now.  No current facility-administered medications for this encounter.  Current Outpatient Medications:    acetaminophen (TYLENOL) 500 MG tablet, Take 2 tablets every 6 hours by oral route for 7 days., Disp: , Rfl:    cyclobenzaprine (FLEXERIL) 5 MG tablet, TAKE 1 TABLET BY MOUTH THREE TIMES A DAY FOR 7 DAYS, Disp: , Rfl:    famotidine (PEPCID) 20 MG tablet, Take 1 tablet (20 mg total) by mouth daily., Disp: 30 tablet, Rfl: 0   fluticasone (FLONASE) 50 MCG/ACT nasal spray, INSTILL 2 SPRAYS BY INTRANASAL ROUTE EVERY DAY, Disp: , Rfl:    guaiFENesin (MUCINEX) 600 MG 12 hr tablet, Take 1 tablet every 12 hours by oral route for 7 days., Disp: , Rfl:    metoCLOPramide (REGLAN) 10 MG tablet, Take 1 tablet (10 mg total) by mouth every 8 (eight) hours as needed for nausea (or headache)., Disp: 30  tablet, Rfl: 0   ondansetron (ZOFRAN-ODT) 4 MG disintegrating tablet, Take 1 tablet (4 mg total) by mouth every 8 (eight) hours as needed for nausea or vomiting., Disp: 20 tablet, Rfl: 0   promethazine (PHENERGAN) 25 MG tablet, Take 1 tablet (25 mg total) by mouth every 6 (six) hours as needed for nausea or vomiting., Disp: 30 tablet, Rfl: 0   valACYclovir (VALTREX) 500 MG tablet, TAKE 1 TABLET TWICE A DAY FOR 5 DAYS FOR OUTBREAKS, Disp: , Rfl:    Allergies  Allergen Reactions   Iodine     Pt states it burns and itches     Latex     Past Medical History:  Diagnosis Date   Anemia    Anxiety    Blood transfusion without reported diagnosis    Chronic back pain    Depression    GERD (gastroesophageal reflux disease)    Headache    Post partum depression    Pyelonephritis    UTI (lower urinary tract infection)      Past Surgical History:  Procedure Laterality Date   DILATION AND CURETTAGE OF UTERUS     INDUCED ABORTION     WISDOM TOOTH EXTRACTION      Family History  Problem Relation Age of Onset   Arthritis Mother    Healthy Father    Arthritis Maternal Grandmother    Asthma Son  Alcohol abuse Neg Hx     Social History   Tobacco Use   Smoking status: Former    Types: Cigarettes    Quit date: 01/21/2014    Years since quitting: 8.8   Smokeless tobacco: Never  Vaping Use   Vaping Use: Some days   Substances: Nicotine, Flavoring  Substance Use Topics   Alcohol use: Not Currently    Comment: rarely   Drug use: Not Currently    Types: Marijuana    Comment: 2-3 weeks ago    ROS   Objective:   Vitals: BP 118/66 (BP Location: Right Arm)   Pulse 64   Temp 99.2 F (37.3 C) (Oral)   Resp 16   LMP 10/26/2022 (Exact Date)   SpO2 99%   Breastfeeding No   Physical Exam Constitutional:      General: She is not in acute distress.    Appearance: Normal appearance. She is well-developed. She is not ill-appearing, toxic-appearing or diaphoretic.  HENT:      Head: Normocephalic and atraumatic.     Nose: Nose normal.     Mouth/Throat:     Mouth: Mucous membranes are moist.  Eyes:     General: No scleral icterus.       Right eye: No discharge.        Left eye: No discharge.     Extraocular Movements: Extraocular movements intact.  Cardiovascular:     Rate and Rhythm: Normal rate.  Pulmonary:     Effort: Pulmonary effort is normal.  Musculoskeletal:     Lumbar back: Spasms and tenderness present. No swelling, edema, deformity, signs of trauma, lacerations or bony tenderness. Decreased range of motion. Positive right straight leg raise test. Negative left straight leg raise test. No scoliosis.     Right hip: Tenderness (lateral) and bony tenderness present. No deformity, lacerations or crepitus. Normal range of motion. Normal strength.  Skin:    General: Skin is warm and dry.  Neurological:     General: No focal deficit present.     Mental Status: She is alert and oriented to person, place, and time.  Psychiatric:        Mood and Affect: Mood normal.        Behavior: Behavior normal.     Results for orders placed or performed during the hospital encounter of 11/13/22 (from the past 24 hour(s))  POCT urinalysis dipstick     Status: None   Collection Time: 11/13/22  5:54 PM  Result Value Ref Range   Color, UA yellow yellow   Clarity, UA clear clear   Glucose, UA negative negative mg/dL   Bilirubin, UA negative negative   Ketones, POC UA negative negative mg/dL   Spec Grav, UA 1.025 1.010 - 1.025   Blood, UA negative negative   pH, UA 7.0 5.0 - 8.0   Protein Ur, POC negative negative mg/dL   Urobilinogen, UA 0.2 0.2 or 1.0 E.U./dL   Nitrite, UA Negative Negative   Leukocytes, UA Negative Negative  POCT urine pregnancy     Status: None   Collection Time: 11/13/22  5:54 PM  Result Value Ref Range   Preg Test, Ur Negative Negative    Assessment and Plan :   PDMP not reviewed this encounter.  1. Sciatica, right side   2.  Vaginal discharge   3. Cloudy urine   4. Right flank pain   5. Right hip pain     Recommended an oral prednisone course for suspected  sciatica.  Use the muscle relaxant as well.  Suspect that this will help with her right hip pain, possible trochanteric bursitis.  Follow-up with the spine specialty clinic as soon as possible.  Urinalysis negative, urine pregnancy test negative.  Urine culture, vaginal swab results pending.  Will treat as appropriate based off of her lab results.  Encouraged avoiding urinary irritants, hydrating consistently.  Counseled patient on potential for adverse effects with medications prescribed/recommended today, ER and return-to-clinic precautions discussed, patient verbalized understanding.    Jaynee Eagles, [place] 11/13/22 [id number]

## 2022-11-13 NOTE — Discharge Instructions (Signed)
Make sure you hydrate very well with plain water and a quantity of 80 ounces of water a day.  Please limit drinks that are considered urinary irritants such as soda, sweet tea, coffee, energy drinks, alcohol.  These can worsen your urinary and genital symptoms but also be the source of them.  I will let you know about your oral/vaginal swab, urine culture results through MyChart to see if we need to prescribe or change your antibiotics based off of those results.

## 2022-11-13 NOTE — ED Triage Notes (Signed)
Pt reports cloudy urine x 4 days. Requested STD test.   Pt reports pain on and off in the right side low back pain and right leg.

## 2022-11-14 LAB — URINE CULTURE: Culture: NO GROWTH

## 2022-11-15 ENCOUNTER — Telehealth (HOSPITAL_COMMUNITY): Payer: Self-pay | Admitting: Emergency Medicine

## 2022-11-15 LAB — CYTOLOGY, (ORAL, ANAL, URETHRAL) ANCILLARY ONLY
Chlamydia: NEGATIVE
Comment: NEGATIVE
Comment: NEGATIVE
Comment: NORMAL
Neisseria Gonorrhea: NEGATIVE
Trichomonas: NEGATIVE

## 2022-11-15 LAB — CERVICOVAGINAL ANCILLARY ONLY
Bacterial Vaginitis (gardnerella): POSITIVE — AB
Candida Glabrata: NEGATIVE
Candida Vaginitis: NEGATIVE
Chlamydia: NEGATIVE
Comment: NEGATIVE
Comment: NEGATIVE
Comment: NEGATIVE
Comment: NEGATIVE
Comment: NEGATIVE
Comment: NORMAL
Neisseria Gonorrhea: NEGATIVE
Trichomonas: NEGATIVE

## 2022-11-15 MED ORDER — METRONIDAZOLE 500 MG PO TABS: 500.0000 mg | ORAL_TABLET | Freq: Two times a day (BID) | ORAL | 0 refills | Status: DC

## 2022-12-24 ENCOUNTER — Other Ambulatory Visit: Payer: Self-pay

## 2022-12-24 ENCOUNTER — Ambulatory Visit
Admission: RE | Admit: 2022-12-24 | Discharge: 2022-12-24 | Disposition: A | Discharge status: Home / Self Care | Payer: Medicaid Other | Source: Ambulatory Visit | Attending: Internal Medicine | Admitting: Internal Medicine

## 2022-12-24 VITALS — BP 98/57 | HR 73 | Temp 98.4°F | Resp 17

## 2022-12-24 DIAGNOSIS — N3001 Acute cystitis with hematuria: Secondary | ICD-10-CM | POA: Insufficient documentation

## 2022-12-24 DIAGNOSIS — Z3202 Encounter for pregnancy test, result negative: Secondary | ICD-10-CM | POA: Insufficient documentation

## 2022-12-24 DIAGNOSIS — Z202 Contact with and (suspected) exposure to infections with a predominantly sexual mode of transmission: Secondary | ICD-10-CM | POA: Diagnosis not present

## 2022-12-24 LAB — POCT URINE PREGNANCY: Preg Test, Ur: NEGATIVE

## 2022-12-24 LAB — POCT URINALYSIS DIP (MANUAL ENTRY)
Bilirubin, UA: NEGATIVE
Glucose, UA: NEGATIVE mg/dL
Ketones, POC UA: NEGATIVE mg/dL
Nitrite, UA: NEGATIVE
Protein Ur, POC: 30 mg/dL — AB
Spec Grav, UA: >=1.03 — AB (ref 1.010–1.025)
Urobilinogen, UA: 0.2 E.U./dL
pH, UA: 6.5 (ref 5.0–8.0)

## 2022-12-24 MED ORDER — CEPHALEXIN 500 MG PO CAPS: 500.0000 mg | ORAL_CAPSULE | Freq: Two times a day (BID) | ORAL | 0 refills | Status: AC

## 2022-12-24 NOTE — Discharge Instructions (Signed)
Your urine shows you likely have a urinary tract infection. I have sent your urine for culture to confirm this. We will go ahead and have you start taking antibiotics due to your symptoms.  Take antibiotic as directed.  (Keflex 500mg every 12 hours for 7 days) If you develop diarrhea while taking this medication you may purchase an over-the-counter probiotic or eat yogurt with live active cultures.  To avoid GI upset please take this medication with food. I have sent your urine for culture to see what type of bacteria grows. We will call you if we need to change the treatment plan based on the results of your urine culture.  Your STD testing has been sent to the lab and will come back in the next 2 to 3 days.  We will call you if any of your results are positive requiring treatment and treat you at that time.   If you do not receive a phone call from us, this means your testing was negative.  Avoid sexual intercourse until your STD results come back.  If any of your STD results are positive, you will need to avoid sexual intercourse for 7 days while you are being treated to prevent spread of STD.  Condom use is the best way to prevent spread of STDs.  If you develop any new or worsening symptoms or do not improve in the next 2 to 3 days, please return.  If your symptoms are severe, please go to the emergency room.  Follow-up with your primary care provider for further evaluation and management of your symptoms as well as ongoing wellness visits.  I hope you feel better! 

## 2022-12-24 NOTE — ED Provider Notes (Signed)
[facility]    CSN: [id number] Arrival date & time: 12/24/22  1300      History   Chief Complaint Chief Complaint  Patient presents with   Urinary Frequency    UTI or std check - Entered by patient    HPI Melanie Hancock is a 27 y.o. female.   Patient presents to urgent care for evaluation of urinary frequency and lower abdominal discomfort that started 2-3 days ago plus associated dysuria that started this morning. Reports history of frequent urinary tract infections in the past and this feels the same.  Denies nausea, vomiting, diarrhea, back pain, dizziness, headaches, fever/chills, and recent antibiotic or steroid use.  Denies frequent intake of known urinary irritants and use of SGLT2 inhibitor.  Last menstrual cycle was last week but she states it was "weird" and did not last as long as normal.  She is requesting pregnancy testing today.  She is also requesting STD testing as she was recently told that her recent sexual partner has been sexually active with other partners unprotected.  No known exposure to STD.  She is experiencing vaginal discharge that is white and thin without odor or itch.  No vaginal rash. She has not attempted use of any OTC medications to help with symptoms.    Urinary Frequency    Past Medical History:  Diagnosis Date   Anemia    Anxiety    Blood transfusion without reported diagnosis    Chronic back pain    Depression    GERD (gastroesophageal reflux disease)    Headache    Post partum depression    Pyelonephritis    UTI (lower urinary tract infection)     Patient Active Problem List   Diagnosis Date Noted   History of gestational diabetes 09/10/2022   Gastroesophageal reflux disease 03/28/2020   Acute intractable headache 03/14/2020   Anemia 09/30/2019   History of postpartum depression, currently pregnant 01/23/2016   Iron deficiency anemia 01/02/2016   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 Authorizing Provider  cephALEXin (KEFLEX) 500 MG capsule Take 1 capsule (500 mg total) by mouth 2 (two) times daily for 7 days. 12/24/22 12/31/22 Yes StanhopeDonavan Burnet, FNP  acetaminophen (TYLENOL) 500 MG tablet Take 2 tablets every 6 hours by oral route for 7 days. 09/03/22   [provider]  famotidine (PEPCID) 20 MG tablet Take 1 tablet (20 mg total) by mouth daily. 09/10/22 10/10/22  Judeth Horn, NP  fluticasone (FLONASE) 50 MCG/ACT nasal spray INSTILL 2 SPRAYS BY INTRANASAL ROUTE EVERY DAY 09/03/22  [provider]  guaiFENesin (MUCINEX) 600 MG 12 hr tablet Take 1 tablet every 12 hours by oral route for 7 days. 09/03/22   [provider]  metoCLOPramide (REGLAN) 10 MG tablet Take 1 tablet (10 mg total) by mouth every 8 (eight) hours as needed for nausea (or headache). 09/10/22   Judeth Horn, NP  metroNIDAZOLE (FLAGYL) 500 MG tablet Take 1 tablet (500 mg total) by mouth 2 (two) times daily. 11/15/22   Merrilee Jansky, MD  ondansetron (ZOFRAN-ODT) 4 MG disintegrating tablet Take 1 tablet (4 mg total) by mouth every 8 (eight) hours as needed for nausea or vomiting. 10/17/21   Theadora Rama Scales, PA-C  predniSONE (DELTASONE) 50 MG tablet Take 1 tablet (50 mg total) by mouth daily with breakfast. 11/13/22   Wallis Bamberg, PA-C  promethazine (PHENERGAN) 25 MG tablet Take 1 tablet (25 mg total) by  mouth every 6 (six) hours as needed for nausea or vomiting. 09/25/22   Brand Males, CNM  tiZANidine (ZANAFLEX) 4 MG tablet Take 1 tablet (4 mg total) by mouth at bedtime. 11/13/22   Wallis Bamberg, PA-C  valACYclovir (VALTREX) 500 MG tablet TAKE 1 TABLET TWICE A DAY FOR 5 DAYS FOR OUTBREAKS    [provider]  pantoprazole (PROTONIX) 20 MG tablet Take 1 tablet (20 mg total) by mouth daily. 09/17/19 11/12/19  Aviva Signs, CNM    Family History Family History  Problem Relation Age of Onset   Arthritis Mother    Healthy Father    Arthritis Maternal Grandmother    Asthma Son    Alcohol abuse Neg Hx     Social History Social History   Tobacco Use   Smoking status: Former    Types: Cigarettes    Quit date: 01/21/2014    Years since quitting: 8.9   Smokeless tobacco: Never  Vaping Use   Vaping Use: Some days   Substances: Nicotine, Flavoring  Substance Use Topics   Alcohol use: Not Currently    Comment: rarely   Drug use: Not Currently    Types: Marijuana    Comment: 2-3 weeks ago     Allergies   Iodine and Latex   Review of Systems Review of Systems  Genitourinary:  Positive for frequency.  Per HPI   Physical Exam Triage Vital Signs ED Triage Vitals  Enc Vitals Group     BP      Pulse      Resp      Temp      Temp src      SpO2      Weight      Height      Head Circumference      Peak Flow      Pain Score      Pain Loc      Pain Edu?      Excl. in GC?    No data found.  Updated Vital Signs BP (!) 98/57 (BP Location: Left Arm)   Pulse 73   Temp 98.4 F (36.9 C) (Oral)   Resp 17   LMP 12/16/2022 (Exact Date)   SpO2 99%   Visual Acuity Right Eye Distance:   Left Eye Distance:   Bilateral Distance:    Right Eye Near:   Left Eye Near:    Bilateral Near:     Physical Exam Vitals and nursing note reviewed.  Constitutional:      Appearance: She is not ill-appearing or toxic-appearing.  HENT:  Head: Normocephalic and  atraumatic.     Right Ear: Hearing and external ear normal.     Left Ear: Hearing and external ear normal.     Nose: Nose normal.     Mouth/Throat:     Lips: Pink.  Eyes:     General: Lids are normal. Vision grossly intact. Gaze aligned appropriately.     Extraocular Movements: Extraocular movements intact.     Conjunctiva/sclera: Conjunctivae normal.  Pulmonary:     Effort: Pulmonary effort is normal.  Abdominal:     General: Bowel sounds are normal.     Palpations: Abdomen is soft.     Tenderness: There is no abdominal tenderness. There is no right CVA tenderness, left CVA tenderness or guarding.  Musculoskeletal:     Cervical back: Neck supple.  Skin:    General: Skin is warm and dry.     Capillary Refill: Capillary refill takes less than 2 seconds.     Findings: No rash.  Neurological:     General: No focal deficit present.     Mental Status: She is alert and oriented to person, place, and time. Mental status is at baseline.     Cranial Nerves: No dysarthria or facial asymmetry.  Psychiatric:        Mood and Affect: Mood normal.        Speech: Speech normal.        Behavior: Behavior normal.        Thought Content: Thought content normal.        Judgment: Judgment normal.      UC Treatments / Results  Labs (all labs ordered are listed, but only abnormal results are displayed) Labs Reviewed  POCT URINALYSIS DIP (MANUAL ENTRY) - Abnormal; Notable for the following components:      Result Value   Clarity, UA cloudy (*)    Spec Grav, UA >=1.030 (*)    Blood, UA small (*)    Protein Ur, POC =30 (*)    Leukocytes, UA Large (3+) (*)    All other components within normal limits  URINE CULTURE  HIV ANTIBODY (ROUTINE TESTING W REFLEX)  RPR  POCT URINE PREGNANCY  CERVICOVAGINAL ANCILLARY ONLY    EKG   Radiology No results found.  Procedures Procedures (including critical care time)  Medications Ordered in UC Medications - No data to display  Initial  Impression / Assessment and Plan / UC Course  I have reviewed the triage vital signs and the nursing notes.  Pertinent labs & imaging results that were available during my care of the patient were reviewed by me and considered in my medical decision making (see chart for details).   1. Acute cystitis with hematuria, negative pregnancy, possible exposure to STD Presentation is consistent with acute uncomplicated cystitis. Patient is nontoxic in appearance with hemodynamically stable vital signs. Low suspicion for acute pyelonephritis. Low suspicion for kidney stone or infected stone. Urine pregnancy is negative. No indication for labs or imaging at this time.  Keflex sent to pharmacy. Denies allergies to antibiotics. Urine culture pending. Patient to push fluids to stay well hydrated and reduce intake of known urinary irritants.  STI labs pending.  Patient would like HIV and syphilis testing today.  Will notify patient of positive results and treat accordingly when labs come back.  Patient to avoid sexual intercourse until screening testing comes back.  Education provided regarding safe sexual practices and patient encouraged to use protection to prevent spread of STIs.  Discussed physical exam and available lab work findings in clinic with patient.  Counseled patient regarding appropriate use of medications and potential side effects for all medications recommended or prescribed today. Discussed red flag signs and symptoms of worsening condition,when to call the PCP office, return to urgent care, and when to seek higher level of care in the emergency department. Patient verbalizes understanding and agreement with plan. All questions answered. Patient discharged in stable condition.    Final Clinical Impressions(s) / UC Diagnoses   Final diagnoses:  Possible exposure to STD  Acute cystitis with hematuria  Urine pregnancy test negative     Discharge Instructions      Your urine shows you  likely have a urinary tract infection. I have sent your urine for culture to confirm this. We will go ahead and have you start taking antibiotics due to your symptoms.  Take antibiotic as directed.  (Keflex 500mg  every 12 hours for 7 days) If you develop diarrhea while taking this medication you may purchase an over-the-counter probiotic or eat yogurt with live active cultures.  To avoid GI upset please take this medication with food. I have sent your urine for culture to see what type of bacteria grows. We will call you if we need to change the treatment plan based on the results of your urine culture.  Your STD testing has been sent to the lab and will come back in the next 2 to 3 days.  We will call you if any of your results are positive requiring treatment and treat you at that time.   If you do not receive a phone call from [place], this means your testing was negative.  Avoid sexual intercourse until your STD results come back.  If any of your STD results are positive, you will need to avoid sexual intercourse for 7 days while you are being treated to prevent spread of STD.  Condom use is the best way to prevent spread of STDs.  If you develop any new or worsening symptoms or do not improve in the next 2 to 3 days, please return.  If your symptoms are severe, please go to the emergency room.  Follow-up with your primary care provider for further evaluation and management of your symptoms as well as ongoing wellness visits.  I hope you feel better!      ED Prescriptions     Medication Sig Dispense Auth. Provider   cephALEXin (KEFLEX) 500 MG capsule Take 1 capsule (500 mg total) by mouth 2 (two) times daily for 7 days. 14 capsule Carlisle Beers, FNP      PDMP not reviewed this encounter.   Carlisle Beers, [place] 12/24/22 1409

## 2022-12-25 LAB — CERVICOVAGINAL ANCILLARY ONLY
Bacterial Vaginitis (gardnerella): POSITIVE — AB
Candida Glabrata: NEGATIVE
Candida Vaginitis: NEGATIVE
Chlamydia: NEGATIVE
Comment: NEGATIVE
Comment: NEGATIVE
Comment: NEGATIVE
Comment: NEGATIVE
Comment: NEGATIVE
Comment: NORMAL
Neisseria Gonorrhea: NEGATIVE
Trichomonas: NEGATIVE

## 2022-12-25 LAB — HIV ANTIBODY (ROUTINE TESTING W REFLEX): HIV Screen 4th Generation wRfx: NONREACTIVE

## 2022-12-25 LAB — RPR: RPR Ser Ql: NONREACTIVE

## 2022-12-26 LAB — URINE CULTURE: Culture: >=100000 — AB

## 2023-01-04 ENCOUNTER — Ambulatory Visit
Admission: RE | Admit: 2023-01-04 | Discharge: 2023-01-04 | Disposition: A | Discharge status: Home / Self Care | Payer: Medicaid Other | Source: Ambulatory Visit | Attending: Internal Medicine | Admitting: Internal Medicine

## 2023-01-04 VITALS — BP 99/67 | HR 60 | Temp 98.5°F | Resp 16

## 2023-01-04 DIAGNOSIS — N898 Other specified noninflammatory disorders of vagina: Secondary | ICD-10-CM | POA: Insufficient documentation

## 2023-01-04 DIAGNOSIS — Z3202 Encounter for pregnancy test, result negative: Secondary | ICD-10-CM | POA: Diagnosis present

## 2023-01-04 DIAGNOSIS — Z113 Encounter for screening for infections with a predominantly sexual mode of transmission: Secondary | ICD-10-CM

## 2023-01-04 DIAGNOSIS — R300 Dysuria: Secondary | ICD-10-CM | POA: Insufficient documentation

## 2023-01-04 DIAGNOSIS — K130 Diseases of lips: Secondary | ICD-10-CM | POA: Diagnosis not present

## 2023-01-04 LAB — POCT URINALYSIS DIP (MANUAL ENTRY)
Bilirubin, UA: NEGATIVE
Blood, UA: NEGATIVE
Glucose, UA: NEGATIVE mg/dL
Ketones, POC UA: NEGATIVE mg/dL
Nitrite, UA: NEGATIVE
Protein Ur, POC: NEGATIVE mg/dL
Spec Grav, UA: 1.02 (ref 1.010–1.025)
Urobilinogen, UA: 0.2 E.U./dL
pH, UA: 7 (ref 5.0–8.0)

## 2023-01-04 LAB — POCT URINE PREGNANCY: Preg Test, Ur: NEGATIVE

## 2023-01-04 MED ORDER — METRONIDAZOLE 500 MG PO TABS: 500.0000 mg | ORAL_TABLET | Freq: Two times a day (BID) | ORAL | 0 refills | Status: DC

## 2023-01-04 MED ORDER — VALACYCLOVIR HCL 1 G PO TABS: 1000.0000 mg | ORAL_TABLET | Freq: Two times a day (BID) | ORAL | 0 refills | Status: AC

## 2023-01-04 NOTE — Discharge Instructions (Addendum)
I have sent you metronidazole to treat BV. Your urine culture and vaginal swab are pending. We will call in a few days when they result. I have sent a swab to test for the cold sore. I have prescribed valtrex to help treat this. Follow up if any symptoms persist or worsen.

## 2023-01-04 NOTE — ED Triage Notes (Signed)
Pt states she had a UTI and BV last month states she is still having burning with urination,cloudy urine and vaginal discharge.

## 2023-01-04 NOTE — ED Provider Notes (Addendum)
[facility]    CSN: [id number] Arrival date & time: 01/04/23  1307      History   Chief Complaint Chief Complaint  Patient presents with   Dysuria    HPI Melanie Hancock is a 27 y.o. female.   Patient presents with persistent dysuria and vaginal discharge.  Patient was seen on 12/24/2022 and treated with cephalexin antibiotic for urinary tract infection.  Patient reports that she also tested positive for bacterial vaginosis but was not sent metronidazole for treatment.  She states that she has 1 pill left of cephalexin.  Symptoms have improved but have not resolved.  Denies any exposure to STD but has had recent unprotected intercourse since last visit.  Last menstrual cycle was approximately 2 weeks ago.  Patient denies fever, chills, body aches, back pain, abdominal pain.  Patient also reporting a lesion to her left lower lip that started upon awakening this morning.  Reports that she had a similar lesion in the past but it resolved on its own. Patient requesting pregnancy testing.    Dysuria   Past Medical History:  Diagnosis Date   Anemia    Anxiety    Blood transfusion without reported diagnosis    Chronic back pain    Depression    GERD (gastroesophageal reflux disease)    Headache    Post partum depression    Pyelonephritis    UTI (lower urinary tract infection)     Patient Active Problem List   Diagnosis Date Noted   History of gestational diabetes 09/10/2022   Gastroesophageal reflux disease 03/28/2020   Acute intractable headache 03/14/2020   Anemia 09/30/2019   History of postpartum depression, currently pregnant 01/23/2016   Iron deficiency anemia 01/02/2016   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   Herpes simplex 11/06/2015   Trichomoniasis 06/17/2015   Anxiety 03/28/2014   Chronic back pain 03/28/2014   Hx pyelonephritis 2014 05/26/2013   History of chlamydia 08/21/2007    Past Surgical History:  Procedure Laterality Date   DILATION AND CURETTAGE  OF UTERUS     INDUCED ABORTION     WISDOM TOOTH EXTRACTION      OB History     Gravida  6   Para  4   Term  4   Preterm      AB  1   Living  4      SAB  0   IAB  1   Ectopic      Multiple  0   Live Births  4            Home Medications    Prior to Admission medications   Medication Sig Start Date End Date Taking? Authorizing Provider  metroNIDAZOLE (FLAGYL) 500 MG tablet Take 1 tablet (500 mg total) by mouth 2 (two) times daily. 01/04/23  Yes , Acie Fredrickson, FNP  valACYclovir (VALTREX) 1000 MG tablet Take 1 tablet (1,000 mg total) by mouth 2 (two) times daily for 7 days. 01/04/23 01/11/23 Yes , Acie Fredrickson, FNP  acetaminophen (TYLENOL) 500 MG tablet Take 2 tablets every 6 hours by oral route for 7 days. 09/03/22   [provider]  famotidine (PEPCID) 20 MG tablet Take 1 tablet (20 mg total) by mouth daily. 09/10/22 10/10/22  Judeth Horn, NP  fluticasone (FLONASE) 50 MCG/ACT nasal spray INSTILL 2 SPRAYS BY INTRANASAL ROUTE EVERY DAY 09/03/22   [provider]  guaiFENesin (MUCINEX) 600 MG 12 hr tablet Take 1 tablet every 12 hours  by oral route for 7 days. 09/03/22   [provider]  metoCLOPramide (REGLAN) 10 MG tablet Take 1 tablet (10 mg total) by mouth every 8 (eight) hours as needed for nausea (or headache). 09/10/22   Judeth Horn, NP  ondansetron (ZOFRAN-ODT) 4 MG disintegrating tablet Take 1 tablet (4 mg total) by mouth every 8 (eight) hours as needed for nausea or vomiting. 10/17/21   Theadora Rama Scales, PA-C  predniSONE (DELTASONE) 50 MG tablet Take 1 tablet (50 mg total) by mouth daily with breakfast. 11/13/22   Wallis Bamberg, PA-C  promethazine (PHENERGAN) 25 MG tablet Take 1 tablet (25 mg total) by mouth every 6 (six) hours as needed for nausea or vomiting. 09/25/22   Brand Males, CNM  tiZANidine (ZANAFLEX) 4 MG tablet Take 1 tablet (4 mg total) by mouth at bedtime. 11/13/22   Wallis Bamberg, PA-C  valACYclovir (VALTREX) 500 MG  tablet TAKE 1 TABLET TWICE A DAY FOR 5 DAYS FOR OUTBREAKS    [provider]  pantoprazole (PROTONIX) 20 MG tablet Take 1 tablet (20 mg total) by mouth daily. 09/17/19 11/12/19  Aviva Signs, CNM    Family History Family History  Problem Relation Age of Onset   Arthritis Mother    Healthy Father    Arthritis Maternal Grandmother    Asthma Son    Alcohol abuse Neg Hx     Social History Social History   Tobacco Use   Smoking status: Former    Types: Cigarettes    Quit date: 01/21/2014    Years since quitting: 8.9   Smokeless tobacco: Never  Vaping Use   Vaping Use: Some days   Substances: Nicotine, Flavoring  Substance Use Topics   Alcohol use: Not Currently    Comment: rarely   Drug use: Not Currently    Types: Marijuana    Comment: 2-3 weeks ago     Allergies   Iodine and Latex   Review of Systems Review of Systems Per HPI  Physical Exam Triage Vital Signs ED Triage Vitals  Enc Vitals Group     BP 01/04/23 1321 99/67     Pulse Rate 01/04/23 1321 60     Resp 01/04/23 1321 16     Temp 01/04/23 1321 98.5 F (36.9 C)     Temp Source 01/04/23 1321 Oral     SpO2 01/04/23 1321 98 %     Weight --      Height --      Head Circumference --      Peak Flow --      Pain Score 01/04/23 1322 0     Pain Loc --      Pain Edu? --      Excl. in GC? --    No data found.  Updated Vital Signs BP 99/67 (BP Location: Right Arm)   Pulse 60   Temp 98.5 F (36.9 C) (Oral)   Resp 16   LMP 12/16/2022 (Approximate)   SpO2 98%   Visual Acuity Right Eye Distance:   Left Eye Distance:   Bilateral Distance:    Right Eye Near:   Left Eye Near:    Bilateral Near:     Physical Exam Constitutional:      General: She is not in acute distress.    Appearance: Normal appearance. She is not toxic-appearing or diaphoretic.  HENT:     Head: Normocephalic and atraumatic.     Mouth/Throat:     Comments: Patient has vesicular  lesion present to left lower  lip. Eyes:     Extraocular Movements: Extraocular movements intact.     Conjunctiva/sclera: Conjunctivae normal.  Pulmonary:     Effort: Pulmonary effort is normal.  Genitourinary:    Comments: Deferred with shared decision making.  Self swab performed. Neurological:     General: No focal deficit present.     Mental Status: She is alert and oriented to person, place, and time. Mental status is at baseline.  Psychiatric:        Mood and Affect: Mood normal.        Behavior: Behavior normal.        Thought Content: Thought content normal.        Judgment: Judgment normal.      UC Treatments / Results  Labs (all labs ordered are listed, but only abnormal results are displayed) Labs Reviewed  POCT URINALYSIS DIP (MANUAL ENTRY) - Abnormal; Notable for the following components:      Result Value   Clarity, UA cloudy (*)    Leukocytes, UA Small (1+) (*)    All other components within normal limits  URINE CULTURE  HSV CULTURE AND TYPING  POCT URINE PREGNANCY  CERVICOVAGINAL ANCILLARY ONLY    EKG   Radiology No results found.  Procedures Procedures (including critical care time)  Medications Ordered in UC Medications - No data to display  Initial Impression / Assessment and Plan / UC Course  I have reviewed the triage vital signs and the nursing notes.  Pertinent labs & imaging results that were available during my care of the patient were reviewed by me and considered in my medical decision making (see chart for details).     1.  Persistent dysuria and vaginal discharge  It appears that patient was treated with cephalexin which was appropriate antibiotic per urine culture.  Although, I do not see where patient was sent metronidazole for positive bacterial vaginosis on vaginal swab.  Patient confirmed this.  Therefore, will send metronidazole.  Given antibiotic was appropriate on urine culture for UTI, will await urine culture and vaginal swab for any further treatment.   Suspect patient's persistent symptoms are due to untreated bacterial vaginosis.  Patient requested urinary pregnancy test which was negative.   Advised follow-up if symptoms persist or worsen.  2.  Lip lesion  This appears consistent with possible cold sore.  HSV swab pending.  Will treat with Valtrex.  Patient advised to follow-up if symptoms persist or worsen.  Patient was advised to not take tizanidine while taking Valtrex as she reports that she only takes tizanidine as needed so that should be safe.  Patient verbalized understanding and was agreeable with plan. Final Clinical Impressions(s) / UC Diagnoses   Final diagnoses:  Vaginal discharge  Screening examination for venereal disease  Dysuria  Lip lesion  Urine pregnancy test negative     Discharge Instructions      I have sent you metronidazole to treat BV. Your urine culture and vaginal swab are pending. We will call in a few days when they result. I have sent a swab to test for the cold sore. I have prescribed valtrex to help treat this. Follow up if any symptoms persist or worsen.     ED Prescriptions     Medication Sig Dispense Auth. Provider   metroNIDAZOLE (FLAGYL) 500 MG tablet Take 1 tablet (500 mg total) by mouth 2 (two) times daily. 14 tablet [place], Acie Fredrickson, [place]   valACYclovir (VALTREX) 1000  MG tablet Take 1 tablet (1,000 mg total) by mouth 2 (two) times daily for 7 days. 14 tablet [place], Acie Fredrickson, [place]      PDMP not reviewed this encounter.   Gustavus Bryant, [place] 01/04/23 1417    Gustavus Bryant, [place] 01/04/23 1419

## 2023-01-06 LAB — URINE CULTURE: Culture: NO GROWTH

## 2023-01-08 LAB — CERVICOVAGINAL ANCILLARY ONLY
Bacterial Vaginitis (gardnerella): POSITIVE — AB
Candida Glabrata: NEGATIVE
Candida Vaginitis: POSITIVE — AB
Chlamydia: NEGATIVE
Comment: NEGATIVE
Comment: NEGATIVE
Comment: NEGATIVE
Comment: NEGATIVE
Comment: NEGATIVE
Comment: NORMAL
Neisseria Gonorrhea: NEGATIVE
Trichomonas: POSITIVE — AB

## 2023-01-08 LAB — HSV CULTURE AND TYPING

## 2023-01-09 ENCOUNTER — Telehealth: Payer: Self-pay | Admitting: Internal Medicine

## 2023-01-09 MED ORDER — FLUCONAZOLE 150 MG PO TABS: 150.0000 mg | ORAL_TABLET | ORAL | 0 refills | Status: DC

## 2023-01-09 NOTE — Telephone Encounter (Signed)
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  Patient was already treated with Valtrex at previous urgent care visit.  Patient's vaginal swab was positive for trichomonas, BV, candidiasis.  Patient already sent metronidazole at previous urgent care visit which will treat trichomonas and BV.  Patient will need treatment with Diflucan for candidiasis.  Attempted to call patient but phone number did not seem to work.  Will send Diflucan to pharmacy on file.

## 2023-02-15 ENCOUNTER — Ambulatory Visit (HOSPITAL_COMMUNITY): Payer: Self-pay | Admitting: Orthopedic Surgery

## 2023-03-04 NOTE — Progress Notes (Signed)
Spoke to patient on the phone, states she cancelled surgery scheduled for 03/07/2023. States she spoke to Dr. Dairl Ponder office to advise them and they gave her a referral to pain management clinic.

## 2023-03-07 ENCOUNTER — Encounter (HOSPITAL_COMMUNITY): Admission: RE | Payer: Self-pay | Source: Home / Self Care

## 2023-03-07 ENCOUNTER — Ambulatory Visit (HOSPITAL_COMMUNITY): Admission: RE | Admit: 2023-03-07 | Payer: Medicaid Other | Source: Home / Self Care | Admitting: Orthopedic Surgery

## 2023-03-07 SURGERY — LUMBAR LAMINECTOMY/DECOMPRESSION MICRODISCECTOMY 1 LEVEL
Anesthesia: General | Laterality: Right

## 2023-04-11 ENCOUNTER — Ambulatory Visit: Payer: Self-pay

## 2023-05-28 ENCOUNTER — Ambulatory Visit (HOSPITAL_COMMUNITY)
Admission: RE | Admit: 2023-05-28 | Discharge: 2023-05-28 | Disposition: A | Discharge status: Home / Self Care | Payer: Medicaid Other | Source: Ambulatory Visit | Attending: Family Medicine | Admitting: Family Medicine

## 2023-05-28 ENCOUNTER — Encounter (HOSPITAL_COMMUNITY): Payer: Self-pay

## 2023-05-28 VITALS — BP 106/58 | HR 83 | Temp 97.9°F | Resp 16

## 2023-05-28 DIAGNOSIS — G8929 Other chronic pain: Secondary | ICD-10-CM | POA: Diagnosis present

## 2023-05-28 DIAGNOSIS — M5442 Lumbago with sciatica, left side: Secondary | ICD-10-CM | POA: Insufficient documentation

## 2023-05-28 DIAGNOSIS — N760 Acute vaginitis: Secondary | ICD-10-CM | POA: Diagnosis present

## 2023-05-28 DIAGNOSIS — M5441 Lumbago with sciatica, right side: Secondary | ICD-10-CM | POA: Diagnosis present

## 2023-05-28 MED ORDER — KETOROLAC TROMETHAMINE 10 MG PO TABS: 10.0000 mg | ORAL_TABLET | Freq: Four times a day (QID) | ORAL | 0 refills | Status: DC | PRN

## 2023-05-28 MED ORDER — METRONIDAZOLE 500 MG PO TABS: 500.0000 mg | ORAL_TABLET | Freq: Two times a day (BID) | ORAL | 0 refills | Status: AC

## 2023-05-28 MED ORDER — KETOROLAC TROMETHAMINE 30 MG/ML IJ SOLN
INTRAMUSCULAR | Status: AC
  Filled 2023-05-28: qty 1

## 2023-05-28 MED ORDER — FLUCONAZOLE 150 MG PO TABS: 150.0000 mg | ORAL_TABLET | ORAL | 0 refills | Status: AC

## 2023-05-28 MED ORDER — KETOROLAC TROMETHAMINE 30 MG/ML IJ SOLN
30.0000 mg | Freq: Once | INTRAMUSCULAR | Status: AC
  Administered 2023-05-28: 30 mg via INTRAMUSCULAR

## 2023-05-28 NOTE — ED Provider Notes (Signed)
[facility]    CSN: [id number] Arrival date & time: 05/28/23  0909      History   Chief Complaint Chief Complaint  Patient presents with   Vaginal Itching    Entered by patient    HPI Melanie Hancock is a 27 y.o. female.    Vaginal Itching  Here for vaginal itching and irritation. Began 10/5. Has possibly some white dc and vaginal burning.   No f/c. Does have some nausea, but no vomiting  No dysuria or frequency.  No abd pain.  LMP 05/21/2023  Not having much back pain, but does have paresthesia in her legs due to her DDD of the L spine.  Is awaiting further evaluation with the spine surgeon.   Is asking for "heavy" pain relief from [place]. Is taking gabapentin already, 300 mg 3 times daily  Then relates that she is seeing pain management, but is out of her oxycodone and so her pain has been worse. Past Medical History:  Diagnosis Date   Anemia    Anxiety    Blood transfusion without reported diagnosis    Chronic back pain    Depression    GERD (gastroesophageal reflux disease)    Headache    Post partum depression    Pyelonephritis    UTI (lower urinary tract infection)     Patient Active Problem List   Diagnosis Date Noted   History of gestational diabetes 09/10/2022   Gastroesophageal reflux disease 03/28/2020   Acute intractable headache 03/14/2020   Anemia 09/30/2019   History of postpartum depression, currently pregnant 01/23/2016   Iron deficiency anemia 01/02/2016   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   Herpes simplex 11/06/2015   Trichomoniasis 06/17/2015   Anxiety 03/28/2014   Chronic back pain 03/28/2014   Hx pyelonephritis 2014 05/26/2013   History of chlamydia 08/21/2007    Past Surgical History:  Procedure Laterality Date   DILATION AND CURETTAGE OF UTERUS     INDUCED ABORTION     WISDOM TOOTH EXTRACTION      OB History     Gravida  6   Para  4   Term  4   Preterm      AB  1   Living  4      SAB  0   IAB  1    Ectopic      Multiple  0   Live Births  4            Home Medications    Prior to Admission medications   Medication Sig Start Date End Date Taking? Authorizing Provider  fluconazole (DIFLUCAN) 150 MG tablet Take 1 tablet (150 mg total) by mouth every 3 (three) days for 3 doses. 05/28/23 06/04/23 Yes Maripaz Mullan, Janace Aris, MD  ketorolac (TORADOL) 10 MG tablet Take 1 tablet (10 mg total) by mouth every 6 (six) hours as needed (pain). 05/28/23  Yes Keirstan Iannello, Janace Aris, MD  metroNIDAZOLE (FLAGYL) 500 MG tablet Take 1 tablet (500 mg total) by mouth 2 (two) times daily for 7 days. 05/28/23 06/04/23 Yes Jerome Viglione, Janace Aris, MD  acetaminophen (TYLENOL) 500 MG tablet Take 2 tablets every 6 hours by oral route for 7 days. 09/03/22   [provider]  famotidine (PEPCID) 20 MG tablet Take 1 tablet (20 mg total) by mouth daily. 09/10/22 10/10/22  Judeth Horn, NP  fluticasone (FLONASE) 50 MCG/ACT nasal spray INSTILL 2 SPRAYS BY INTRANASAL ROUTE EVERY DAY 09/03/22   [provider]  gabapentin (NEURONTIN) 100 MG capsule Take 100-300 mg by mouth at bedtime.    [provider]  tiZANidine (ZANAFLEX) 4 MG tablet Take 1 tablet (4 mg total) by mouth at bedtime. 11/13/22   Wallis Bamberg, PA-C  valACYclovir (VALTREX) 500 MG tablet TAKE 1 TABLET TWICE A DAY FOR 5 DAYS FOR OUTBREAKS    [provider]  pantoprazole (PROTONIX) 20 MG tablet Take 1 tablet (20 mg total) by mouth daily. 09/17/19 11/12/19  Aviva Signs, CNM    Family History Family History  Problem Relation Age of Onset   Arthritis Mother    Healthy Father    Arthritis Maternal Grandmother    Asthma Son    Alcohol abuse Neg Hx     Social History Social History   Tobacco Use   Smoking status: Former    Current packs/day: 0.00    Types: Cigarettes    Quit date: 01/21/2014    Years since quitting: 9.3   Smokeless tobacco: Never  Vaping Use   Vaping status: Some Days   Substances: Nicotine, Flavoring   Substance Use Topics   Alcohol use: Not Currently    Comment: rarely   Drug use: Not Currently    Types: Marijuana    Comment: 2-3 weeks ago     Allergies   Iodine and Latex   Review of Systems Review of Systems   Physical Exam Triage Vital Signs ED Triage Vitals  Encounter Vitals Group     BP 05/28/23 0922 (!) 106/58     Systolic BP Percentile --      Diastolic BP Percentile --      Pulse Rate 05/28/23 0922 83     Resp 05/28/23 0922 16     Temp 05/28/23 0922 97.9 F (36.6 C)     Temp Source 05/28/23 0922 Oral     SpO2 05/28/23 0922 99 %     Weight --      Height --      Head Circumference --      Peak Flow --      Pain Score 05/28/23 0923 10     Pain Loc --      Pain Education --      Exclude from Growth Chart --    No data found.  Updated Vital Signs BP (!) 106/58 (BP Location: Left Arm)   Pulse 83   Temp 97.9 F (36.6 C) (Oral)   Resp 16   LMP 05/21/2023 (Approximate)   SpO2 99%   Visual Acuity Right Eye Distance:   Left Eye Distance:   Bilateral Distance:    Right Eye Near:   Left Eye Near:    Bilateral Near:     Physical Exam Vitals reviewed.  Constitutional:      General: She is not in acute distress.    Appearance: She is not ill-appearing, toxic-appearing or diaphoretic.  HENT:     Nose: Nose normal.     Mouth/Throat:     Mouth: Mucous membranes are moist.  Eyes:     Extraocular Movements: Extraocular movements intact.     Conjunctiva/sclera: Conjunctivae normal.  Cardiovascular:     Rate and Rhythm: Normal rate and regular rhythm.     Heart sounds: No murmur heard. Pulmonary:     Effort: Pulmonary effort is normal.     Breath sounds: Normal breath sounds.  Abdominal:     Palpations: Abdomen is soft.     Tenderness: There is no abdominal tenderness.  Genitourinary:  Comments: There is some mild erythema of bilateral labia minora medially at the introitus.  There is no swelling and there are no ulcerations.  No rash.   Chaperone is present at the time of exam Musculoskeletal:     Cervical back: Neck supple.  Lymphadenopathy:     Cervical: No cervical adenopathy.  Skin:    Coloration: Skin is not jaundiced or pale.  Neurological:     Mental Status: She is alert and oriented to person, place, and time.  Psychiatric:        Behavior: Behavior normal.      UC Treatments / Results  Labs (all labs ordered are listed, but only abnormal results are displayed) Labs Reviewed  CERVICOVAGINAL ANCILLARY ONLY    EKG   Radiology No results found.  Procedures Procedures (including critical care time)  Medications Ordered in UC Medications  ketorolac (TORADOL) 30 MG/ML injection 30 mg (has no administration in time range)    Initial Impression / Assessment and Plan / UC Course  I have reviewed the triage vital signs and the nursing notes.  Pertinent labs & imaging results that were available during my care of the patient were reviewed by me and considered in my medical decision making (see chart for details).        Toradol injection is given here and Toradol tablets are sent in for short-term use for her chronic back pain and sciatica.  She will follow-up with her pain management.  Vaginal self swab is done, and we will notify of any positives on that and treat per protocol.  Flagyl and Diflucan are sent in to treat empirically for BV and yeast.  Final Clinical Impressions(s) / UC Diagnoses   Final diagnoses:  Acute vaginitis  Chronic bilateral low back pain with bilateral sciatica     Discharge Instructions      You have been given a shot of Toradol 30 mg today.  Ketorolac 10 mg tablets--take 1 tablet every 6 hours as needed for pain.  This is the same medicine that is in the shot we just gave you  Take metronidazole 500 mg--1 tablet 2 times daily for 7 days.  Avoid drinking alcohol within 72 hours of taking this medication  Take fluconazole 150 mg--1 tablet every 3 days for  3 doses  Staff will notify you if there is anything positive on the swab.  Please call your pain management about your chronic pain medication.      ED Prescriptions     Medication Sig Dispense Auth. Provider   metroNIDAZOLE (FLAGYL) 500 MG tablet Take 1 tablet (500 mg total) by mouth 2 (two) times daily for 7 days. 14 tablet Saman Umstead, Janace Aris, MD   fluconazole (DIFLUCAN) 150 MG tablet Take 1 tablet (150 mg total) by mouth every 3 (three) days for 3 doses. 3 tablet Tyshia Fenter, Janace Aris, MD   ketorolac (TORADOL) 10 MG tablet Take 1 tablet (10 mg total) by mouth every 6 (six) hours as needed (pain). 20 tablet Catriona Dillenbeck, Janace Aris, MD      I have reviewed the PDMP during this encounter.   Zenia Resides, MD 05/28/23 1022

## 2023-05-28 NOTE — Discharge Instructions (Signed)
You have been given a shot of Toradol 30 mg today.  Ketorolac 10 mg tablets--take 1 tablet every 6 hours as needed for pain.  This is the same medicine that is in the shot we just gave you  Take metronidazole 500 mg--1 tablet 2 times daily for 7 days.  Avoid drinking alcohol within 72 hours of taking this medication  Take fluconazole 150 mg--1 tablet every 3 days for 3 doses  Staff will notify you if there is anything positive on the swab.  Please call your pain management about your chronic pain medication.

## 2023-05-28 NOTE — ED Triage Notes (Signed)
Pt states vaginal itching for the past 3 days. Pt also c/o bilateral leg pain states she had bulging discs in her lower back and has not been able to see her ortho.

## 2023-05-29 LAB — CERVICOVAGINAL ANCILLARY ONLY
Bacterial Vaginitis (gardnerella): POSITIVE — AB
Candida Glabrata: NEGATIVE
Candida Vaginitis: POSITIVE — AB
Chlamydia: NEGATIVE
Comment: NEGATIVE
Comment: NEGATIVE
Comment: NEGATIVE
Comment: NEGATIVE
Comment: NEGATIVE
Comment: NORMAL
Neisseria Gonorrhea: NEGATIVE
Trichomonas: POSITIVE — AB

## 2023-06-18 NOTE — Pre-Procedure Instructions (Signed)
Surgical Instructions   Your procedure is scheduled on Thursday, November 7th. Report to [facility] Main Entrance "A" at 10:30 A.M., then check in with the Admitting office. Any questions or running late day of surgery: call [phone]  Questions prior to your surgery date: call [phone], Monday-Friday, 8am-4pm. If you experience any cold or flu symptoms such as cough, fever, chills, shortness of breath, etc. between now and your scheduled surgery, please notify us at the above number.     Remember:  Do not eat after midnight the night before your surgery  You may drink clear liquids until 09:30 AM the morning of your surgery.   Clear liquids allowed are: Water, Non-Citrus Juices (without pulp), Carbonated Beverages, Clear Tea, Black Coffee Only (NO MILK, CREAM OR POWDERED CREAMER of any kind), and Gatorade.    Take these medicines the morning of surgery with A SIP OF WATER  gabapentin (NEURONTIN)  metroNIDAZOLE (FLAGYL)   May take these medicines IF NEEDED: acetaminophen (TYLENOL)  cyclobenzaprine (FLEXERIL)  fluticasone (FLONASE)  ketorolac (TORADOL)  valACYclovir (VALTREX)    One week prior to surgery, STOP taking any Aspirin (unless otherwise instructed by your surgeon) Aleve, Naproxen, Ibuprofen, Motrin, Advil, Goody's, BC's, all herbal medications, fish oil, and non-prescription vitamins.                     Do NOT Smoke (Tobacco/Vaping) for 24 hours prior to your procedure.  If you use a CPAP at night, you may bring your mask/headgear for your overnight stay.   You will be asked to remove any contacts, glasses, piercing's, hearing aid's, dentures/partials prior to surgery. Please bring cases for these items if needed.    Patients discharged the day of surgery will not be allowed to drive home, and someone needs to stay with them for 24 hours.  SURGICAL WAITING ROOM VISITATION Patients may have no more than 2 support people in the waiting area - these visitors  may rotate.   Pre-op nurse will coordinate an appropriate time for 1 ADULT support person, who may not rotate, to accompany patient in pre-op.  Children under the age of 24 must have an adult with them who is not the patient and must remain in the main waiting area with an adult.  If the patient needs to stay at the hospital during part of their recovery, the visitor guidelines for inpatient rooms apply.  Please refer to the [facility] website for the visitor guidelines for any additional information.   If you received a COVID test during your pre-op visit  it is requested that you wear a mask when out in public, stay away from anyone that may not be feeling well and notify your surgeon if you develop symptoms. If you have been in contact with anyone that has tested positive in the last 10 days please notify you surgeon.      Pre-operative 5 CHG Bathing Instructions   You can play a key role in reducing the risk of infection after surgery. Your skin needs to be as free of germs as possible. You can reduce the number of germs on your skin by washing with CHG (chlorhexidine gluconate) soap before surgery. CHG is an antiseptic soap that kills germs and continues to kill germs even after washing.   DO NOT use if you have an allergy to chlorhexidine/CHG or antibacterial soaps. If your skin becomes reddened or irritated, stop using the CHG and notify one of our RNs at [phone].  Please shower with the CHG soap starting 4 days before surgery using the following schedule:     Please keep in mind the following:  DO NOT shave, including legs and underarms, starting the day of your first shower.   You may shave your face at any point before/day of surgery.  Place clean sheets on your bed the day you start using CHG soap. Use a clean washcloth (not used since being washed) for each shower. DO NOT sleep with pets once you start using the CHG.   CHG Shower Instructions:  Wash your face and  private area with normal soap. If you choose to wash your hair, wash first with your normal shampoo.  After you use shampoo/soap, rinse your hair and body thoroughly to remove shampoo/soap residue.  Turn the water OFF and apply about 3 tablespoons (45 ml) of CHG soap to a CLEAN washcloth.  Apply CHG soap ONLY FROM YOUR NECK DOWN TO YOUR TOES (washing for 3-5 minutes)  DO NOT use CHG soap on face, private areas, open wounds, or sores.  Pay special attention to the area where your surgery is being performed.  If you are having back surgery, having someone wash your back for you may be helpful. Wait 2 minutes after CHG soap is applied, then you may rinse off the CHG soap.  Pat dry with a clean towel  Put on clean clothes/pajamas   If you choose to wear lotion, please use ONLY the CHG-compatible lotions on the back of this paper.   Additional instructions for the day of surgery: DO NOT APPLY any lotions, deodorants, cologne, or perfumes.   Do not bring valuables to the hospital. [facility] is not responsible for any belongings/valuables. Do not wear nail polish, gel polish, artificial nails, or any other type of covering on natural nails (fingers and toes) Do not wear jewelry or makeup Put on clean/comfortable clothes.  Please brush your teeth.  Ask your nurse before applying any prescription medications to the skin.     CHG Compatible Lotions   Aveeno Moisturizing lotion  Cetaphil Moisturizing Cream  Cetaphil Moisturizing Lotion  Clairol Herbal Essence Moisturizing Lotion, Dry Skin  Clairol Herbal Essence Moisturizing Lotion, Extra Dry Skin  Clairol Herbal Essence Moisturizing Lotion, Normal Skin  Curel Age Defying Therapeutic Moisturizing Lotion with Alpha Hydroxy  Curel Extreme Care Body Lotion  Curel Soothing Hands Moisturizing Hand Lotion  Curel Therapeutic Moisturizing Cream, Fragrance-Free  Curel Therapeutic Moisturizing Lotion, Fragrance-Free  Curel Therapeutic Moisturizing  Lotion, Original Formula  Eucerin Daily Replenishing Lotion  Eucerin Dry Skin Therapy Plus Alpha Hydroxy Crme  Eucerin Dry Skin Therapy Plus Alpha Hydroxy Lotion  Eucerin Original Crme  Eucerin Original Lotion  Eucerin Plus Crme Eucerin Plus Lotion  Eucerin TriLipid Replenishing Lotion  Keri Anti-Bacterial Hand Lotion  Keri Deep Conditioning Original Lotion Dry Skin Formula Softly Scented  Keri Deep Conditioning Original Lotion, Fragrance Free Sensitive Skin Formula  Keri Lotion Fast Absorbing Fragrance Free Sensitive Skin Formula  Keri Lotion Fast Absorbing Softly Scented Dry Skin Formula  Keri Original Lotion  Keri Skin Renewal Lotion Keri Silky Smooth Lotion  Keri Silky Smooth Sensitive Skin Lotion  Nivea Body Creamy Conditioning Oil  Nivea Body Extra Enriched Teacher, adult education Moisturizing Lotion Nivea Crme  Nivea Skin Firming Lotion  NutraDerm 30 Skin Lotion  NutraDerm Skin Lotion  NutraDerm Therapeutic Skin Cream  NutraDerm Therapeutic Skin Lotion  ProShield Protective Hand Cream  Provon moisturizing lotion  Please read over the following fact sheets that you were given.

## 2023-06-19 ENCOUNTER — Encounter (HOSPITAL_COMMUNITY)
Admission: RE | Admit: 2023-06-19 | Discharge: 2023-06-19 | Disposition: A | Discharge status: Home / Self Care | Payer: Medicaid Other | Source: Ambulatory Visit | Attending: Orthopedic Surgery | Admitting: Orthopedic Surgery

## 2023-06-19 ENCOUNTER — Encounter (HOSPITAL_COMMUNITY): Payer: Self-pay

## 2023-06-19 ENCOUNTER — Other Ambulatory Visit: Payer: Self-pay

## 2023-06-19 VITALS — BP 102/64 | HR 57 | Temp 98.0°F | Resp 18 | Ht 65.0 in | Wt 172.7 lb

## 2023-06-19 DIAGNOSIS — B9689 Other specified bacterial agents as the cause of diseases classified elsewhere: Secondary | ICD-10-CM | POA: Diagnosis not present

## 2023-06-19 DIAGNOSIS — Z8659 Personal history of other mental and behavioral disorders: Secondary | ICD-10-CM

## 2023-06-19 DIAGNOSIS — N10 Acute tubulo-interstitial nephritis: Secondary | ICD-10-CM | POA: Diagnosis not present

## 2023-06-19 DIAGNOSIS — Z01818 Encounter for other preprocedural examination: Secondary | ICD-10-CM

## 2023-06-19 DIAGNOSIS — Z01812 Encounter for preprocedural laboratory examination: Secondary | ICD-10-CM | POA: Diagnosis present

## 2023-06-19 HISTORY — DX: Bipolar disorder, unspecified: F31.9

## 2023-06-19 HISTORY — DX: Unspecified asthma, uncomplicated: J45.909

## 2023-06-19 LAB — CBC
HCT: 33.7 % — ABNORMAL LOW (ref 36.0–46.0)
Hemoglobin: 10.2 g/dL — ABNORMAL LOW (ref 12.0–15.0)
MCH: 24.1 pg — ABNORMAL LOW (ref 26.0–34.0)
MCHC: 30.3 g/dL (ref 30.0–36.0)
MCV: 79.5 fL — ABNORMAL LOW (ref 80.0–100.0)
Platelets: 383 10*3/uL (ref 150–400)
RBC: 4.24 MIL/uL (ref 3.87–5.11)
RDW: 17.1 % — ABNORMAL HIGH (ref 11.5–15.5)
WBC: 3.5 10*3/uL — ABNORMAL LOW (ref 4.0–10.5)
nRBC: 0 % (ref 0.0–0.2)

## 2023-06-19 LAB — BASIC METABOLIC PANEL
Anion gap: 3 — ABNORMAL LOW (ref 5–15)
BUN: 11 mg/dL (ref 6–20)
CO2: 28 mmol/L (ref 22–32)
Calcium: 9.2 mg/dL (ref 8.9–10.3)
Chloride: 108 mmol/L (ref 98–111)
Creatinine, Ser: 0.72 mg/dL (ref 0.44–1.00)
GFR, Estimated: >60 mL/min (ref >60)
Glucose, Bld: 96 mg/dL (ref 70–99)
Potassium: 4.6 mmol/L (ref 3.5–5.1)
Sodium: 139 mmol/L (ref 135–145)

## 2023-06-19 LAB — SURGICAL PCR SCREEN
MRSA, PCR: NEGATIVE
Staphylococcus aureus: NEGATIVE

## 2023-06-19 NOTE — Progress Notes (Signed)
[facility] Cardiologist - denies  PPM/ICD - denies   Chest x-ray - 02/17/18 EKG - 10/05/19 (n/a) Stress Test - denies ECHO - denies Cardiac Cath - denies  Sleep Study - denies   DM- denies  ASA/Blood Thinner Instructions: n/a   ERAS Protcol - yes, no drink   COVID TEST- n/a   Anesthesia review: yes, pt having symptoms (from 10/8 urgent care visit). She was diagnosed with trichomonas, BV, and yeast infection. Pt states she finished her Flagyl last week and is still having discharge. I called [facility] to have an appt set up for her prior to surgery. They stated they will call the pt to schedule an appt. I told the pt to be proactive and call tomorrow to f/u on getting an appt scheduled if she has not heard from them.  Patient denies shortness of breath, fever, cough and chest pain at PAT appointment   All instructions explained to the patient, with a verbal understanding of the material. Patient agrees to go over the instructions while at home for a better understanding. Patient also instructed to self quarantine after being tested for COVID-19. The opportunity to ask questions was provided.

## 2023-06-26 NOTE — Anesthesia Preprocedure Evaluation (Signed)
Anesthesia Evaluation  Patient identified by MRN, date of birth, ID band Patient awake    Reviewed: Allergy & Precautions, NPO status , Patient's Chart, lab work & pertinent test results  History of Anesthesia Complications Negative for: history of anesthetic complications  Airway Mallampati: I  TM Distance: >3 FB Neck ROM: Full    Dental  (+) Dental Advisory Given   Pulmonary neg shortness of breath, asthma (no recent flares) , neg sleep apnea, neg COPD, neg recent URI, Current Smoker   Pulmonary exam normal breath sounds clear to auscultation       Cardiovascular (-) hypertension(-) angina (-) Past MI, (-) Cardiac Stents and (-) CABG (-) dysrhythmias  Rhythm:Regular Rate:Normal     Neuro/Psych  Headaches, neg Seizures PSYCHIATRIC DISORDERS Anxiety Depression Bipolar Disorder   Chronic back pain    GI/Hepatic Neg liver ROS,GERD  ,,  Endo/Other  negative endocrine ROS    Renal/GU negative Renal ROS     Musculoskeletal   Abdominal   Peds  Hematology  (+) Blood dyscrasia, anemia Lab Results      Component                Value               Date                      WBC                      3.5 (L)             06/19/2023                HGB                      10.2 (L)            06/19/2023                HCT                      33.7 (L)            06/19/2023                MCV                      79.5 (L)            06/19/2023                PLT                      383                 06/19/2023              Anesthesia Other Findings Piercing in her top lip frenulum. Discussed with patient that she would need to remove this. Patient stated that she would try but knows there is a risk of injury if she is unable to remove it.  Reproductive/Obstetrics                             Anesthesia Physical Anesthesia Plan  ASA: 2  Anesthesia Plan: General   Post-op Pain Management: Tylenol PO  (pre-op)*   Induction: Intravenous  PONV Risk Score and Plan: 2 and  Ondansetron, Dexamethasone and Treatment may vary due to age or medical condition  Airway Management Planned: Oral ETT  Additional Equipment:   Intra-op Plan:   Post-operative Plan: Extubation in OR  Informed Consent: I have reviewed the patients History and Physical, chart, labs and discussed the procedure including the risks, benefits and alternatives for the proposed anesthesia with the patient or authorized representative who has indicated his/her understanding and acceptance.     Dental advisory given  Plan Discussed with: CRNA and Anesthesiologist  Anesthesia Plan Comments: (Risks of general anesthesia discussed including, but not limited to, sore throat, hoarse voice, chipped/damaged teeth, injury to vocal cords, nausea and vomiting, allergic reactions, lung infection, heart attack, stroke, and death. All questions answered. )       Anesthesia Quick Evaluation

## 2023-06-27 ENCOUNTER — Encounter (HOSPITAL_COMMUNITY)
Admission: RE | Disposition: A | Discharge status: Home / Self Care | Payer: Self-pay | Source: Home / Self Care | Attending: Orthopedic Surgery

## 2023-06-27 ENCOUNTER — Other Ambulatory Visit: Payer: Self-pay

## 2023-06-27 ENCOUNTER — Observation Stay (HOSPITAL_COMMUNITY)
Admission: RE | Admit: 2023-06-27 | Discharge: 2023-07-02 | Disposition: A | Discharge status: Home / Self Care | Payer: Medicaid Other | Attending: Orthopedic Surgery | Admitting: Orthopedic Surgery

## 2023-06-27 ENCOUNTER — Ambulatory Visit (HOSPITAL_COMMUNITY): Payer: Medicaid Other | Admitting: Physician Assistant

## 2023-06-27 ENCOUNTER — Encounter (HOSPITAL_COMMUNITY): Payer: Self-pay | Admitting: Orthopedic Surgery

## 2023-06-27 ENCOUNTER — Ambulatory Visit (HOSPITAL_BASED_OUTPATIENT_CLINIC_OR_DEPARTMENT_OTHER): Payer: Self-pay | Admitting: Anesthesiology

## 2023-06-27 ENCOUNTER — Ambulatory Visit (HOSPITAL_COMMUNITY): Payer: Medicaid Other

## 2023-06-27 DIAGNOSIS — M4807 Spinal stenosis, lumbosacral region: Secondary | ICD-10-CM | POA: Diagnosis not present

## 2023-06-27 DIAGNOSIS — M5117 Intervertebral disc disorders with radiculopathy, lumbosacral region: Principal | ICD-10-CM | POA: Insufficient documentation

## 2023-06-27 DIAGNOSIS — M5126 Other intervertebral disc displacement, lumbar region: Secondary | ICD-10-CM | POA: Diagnosis not present

## 2023-06-27 DIAGNOSIS — J45909 Unspecified asthma, uncomplicated: Secondary | ICD-10-CM | POA: Diagnosis not present

## 2023-06-27 DIAGNOSIS — Z91040 Latex allergy status: Secondary | ICD-10-CM | POA: Diagnosis not present

## 2023-06-27 DIAGNOSIS — Z79899 Other long term (current) drug therapy: Secondary | ICD-10-CM | POA: Diagnosis not present

## 2023-06-27 DIAGNOSIS — Z01818 Encounter for other preprocedural examination: Secondary | ICD-10-CM

## 2023-06-27 HISTORY — PX: LUMBAR LAMINECTOMY/DECOMPRESSION MICRODISCECTOMY: SHX5026

## 2023-06-27 LAB — POCT PREGNANCY, URINE: Preg Test, Ur: NEGATIVE

## 2023-06-27 SURGERY — LUMBAR LAMINECTOMY/DECOMPRESSION MICRODISCECTOMY 1 LEVEL
Anesthesia: General | Site: Spine Lumbar | Laterality: Bilateral

## 2023-06-27 MED ORDER — OXYCODONE-ACETAMINOPHEN 10-325 MG PO TABS: 1.0000 | ORAL_TABLET | Freq: Four times a day (QID) | ORAL | 0 refills | Status: AC | PRN

## 2023-06-27 MED ORDER — LACTATED RINGERS IV SOLN: INTRAVENOUS | Status: DC

## 2023-06-27 MED ORDER — OXYCODONE HCL 5 MG PO TABS: 5.0000 mg | ORAL_TABLET | Freq: Once | ORAL | Status: DC | PRN

## 2023-06-27 MED ORDER — KETAMINE HCL 10 MG/ML IJ SOLN
INTRAMUSCULAR | Status: DC | PRN
  Administered 2023-06-27: 30 mg via INTRAVENOUS

## 2023-06-27 MED ORDER — DIPHENHYDRAMINE HCL 25 MG PO CAPS
25.0000 mg | ORAL_CAPSULE | Freq: Four times a day (QID) | ORAL | Status: DC | PRN
  Administered 2023-06-27 – 2023-06-29 (×4): 25 mg via ORAL
  Filled 2023-06-27 (×4): qty 1

## 2023-06-27 MED ORDER — KETAMINE HCL 50 MG/5ML IJ SOSY
PREFILLED_SYRINGE | INTRAMUSCULAR | Status: AC
  Filled 2023-06-27: qty 5

## 2023-06-27 MED ORDER — GABAPENTIN 300 MG PO CAPS: 300.0000 mg | ORAL_CAPSULE | Freq: Three times a day (TID) | ORAL | 0 refills | Status: DC | PRN

## 2023-06-27 MED ORDER — THROMBIN 20000 UNITS EX SOLR
CUTANEOUS | Status: DC | PRN
  Administered 2023-06-27: 20 mL via TOPICAL

## 2023-06-27 MED ORDER — PROPOFOL 10 MG/ML IV BOLUS
INTRAVENOUS | Status: AC
  Filled 2023-06-27: qty 20

## 2023-06-27 MED ORDER — AMISULPRIDE (ANTIEMETIC) 5 MG/2ML IV SOLN: 10.0000 mg | Freq: Once | INTRAVENOUS | Status: DC | PRN

## 2023-06-27 MED ORDER — METHOCARBAMOL 1000 MG/10ML IJ SOLN: 500.0000 mg | Freq: Four times a day (QID) | INTRAMUSCULAR | Status: DC | PRN

## 2023-06-27 MED ORDER — PROPOFOL 10 MG/ML IV BOLUS
INTRAVENOUS | Status: DC | PRN
  Administered 2023-06-27: 150 mg via INTRAVENOUS
  Administered 2023-06-27: 50 mg via INTRAVENOUS

## 2023-06-27 MED ORDER — HYDROMORPHONE HCL 1 MG/ML IJ SOLN
INTRAMUSCULAR | Status: AC
  Filled 2023-06-27: qty 0.5

## 2023-06-27 MED ORDER — DIPHENHYDRAMINE HCL 50 MG/ML IJ SOLN
INTRAMUSCULAR | Status: AC
  Filled 2023-06-27: qty 1

## 2023-06-27 MED ORDER — ROCURONIUM BROMIDE 10 MG/ML (PF) SYRINGE
PREFILLED_SYRINGE | INTRAVENOUS | Status: DC | PRN
  Administered 2023-06-27: 60 mg via INTRAVENOUS

## 2023-06-27 MED ORDER — PHENOL 1.4 % MT LIQD: 1.0000 | OROMUCOSAL | Status: DC | PRN

## 2023-06-27 MED ORDER — THROMBIN 20000 UNITS EX SOLR
CUTANEOUS | Status: AC
  Filled 2023-06-27: qty 20000

## 2023-06-27 MED ORDER — SURGIFLO WITH THROMBIN (HEMOSTATIC MATRIX KIT) OPTIME
TOPICAL | Status: DC | PRN
  Administered 2023-06-27: 1 via TOPICAL

## 2023-06-27 MED ORDER — ONDANSETRON HCL 4 MG/2ML IJ SOLN: 4.0000 mg | Freq: Four times a day (QID) | INTRAMUSCULAR | Status: DC | PRN

## 2023-06-27 MED ORDER — MENTHOL 3 MG MT LOZG
1.0000 | LOZENGE | OROMUCOSAL | Status: DC | PRN
Start: 2023-06-27 — End: 2023-07-02

## 2023-06-27 MED ORDER — DEXMEDETOMIDINE HCL IN NACL 200 MCG/50ML IV SOLN
INTRAVENOUS | Status: DC | PRN
  Administered 2023-06-27: 8 ug via INTRAVENOUS

## 2023-06-27 MED ORDER — ACETAMINOPHEN 500 MG PO TABS
1000.0000 mg | ORAL_TABLET | Freq: Once | ORAL | Status: AC
  Administered 2023-06-27: 1000 mg via ORAL
  Filled 2023-06-27: qty 2

## 2023-06-27 MED ORDER — OXYCODONE HCL 5 MG PO TABS
5.0000 mg | ORAL_TABLET | ORAL | Status: DC | PRN
  Filled 2023-06-27 (×3): qty 1

## 2023-06-27 MED ORDER — CHLORHEXIDINE GLUCONATE 0.12 % MT SOLN
15.0000 mL | Freq: Once | OROMUCOSAL | Status: AC
  Administered 2023-06-27: 15 mL via OROMUCOSAL
  Filled 2023-06-27: qty 15

## 2023-06-27 MED ORDER — FENTANYL CITRATE (PF) 100 MCG/2ML IJ SOLN
25.0000 ug | INTRAMUSCULAR | Status: DC | PRN
  Administered 2023-06-27: 50 ug via INTRAVENOUS

## 2023-06-27 MED ORDER — CEFAZOLIN SODIUM-DEXTROSE 1-4 GM/50ML-% IV SOLN
1.0000 g | Freq: Three times a day (TID) | INTRAVENOUS | Status: AC
  Administered 2023-06-27 – 2023-06-28 (×2): 1 g via INTRAVENOUS
  Filled 2023-06-27 (×2): qty 50

## 2023-06-27 MED ORDER — ONDANSETRON HCL 4 MG/2ML IJ SOLN
INTRAMUSCULAR | Status: DC | PRN
  Administered 2023-06-27: 4 mg via INTRAVENOUS

## 2023-06-27 MED ORDER — OXYCODONE HCL 5 MG/5ML PO SOLN: 5.0000 mg | Freq: Once | ORAL | Status: DC | PRN

## 2023-06-27 MED ORDER — RISPERIDONE 0.5 MG PO TABS: 0.5000 mg | ORAL_TABLET | Freq: Every evening | ORAL | Status: DC | PRN

## 2023-06-27 MED ORDER — POLYETHYLENE GLYCOL 3350 17 G PO PACK
17.0000 g | PACK | Freq: Every day | ORAL | Status: DC | PRN
  Administered 2023-06-27: 17 g via ORAL
  Filled 2023-06-27: qty 1

## 2023-06-27 MED ORDER — SUGAMMADEX SODIUM 200 MG/2ML IV SOLN
INTRAVENOUS | Status: DC | PRN
  Administered 2023-06-27: 100 mg via INTRAVENOUS

## 2023-06-27 MED ORDER — ONDANSETRON HCL 4 MG PO TABS: 4.0000 mg | ORAL_TABLET | Freq: Three times a day (TID) | ORAL | 0 refills | Status: DC | PRN

## 2023-06-27 MED ORDER — LACTATED RINGERS IV SOLN: INTRAVENOUS | Status: DC | PRN

## 2023-06-27 MED ORDER — BUPIVACAINE-EPINEPHRINE (PF) 0.25% -1:200000 IJ SOLN
INTRAMUSCULAR | Status: AC
  Filled 2023-06-27: qty 30

## 2023-06-27 MED ORDER — MAGNESIUM CITRATE PO SOLN
1.0000 | Freq: Once | ORAL | Status: AC | PRN
  Administered 2023-06-28: 1 via ORAL
  Filled 2023-06-27: qty 296

## 2023-06-27 MED ORDER — FENTANYL CITRATE (PF) 250 MCG/5ML IJ SOLN
INTRAMUSCULAR | Status: AC
  Filled 2023-06-27: qty 5

## 2023-06-27 MED ORDER — HYDROMORPHONE HCL 1 MG/ML IJ SOLN: 1.0000 mg | INTRAMUSCULAR | Status: DC | PRN

## 2023-06-27 MED ORDER — METHYLPREDNISOLONE ACETATE 40 MG/ML IJ SUSP
INTRAMUSCULAR | Status: AC
  Filled 2023-06-27: qty 1

## 2023-06-27 MED ORDER — DEXAMETHASONE SODIUM PHOSPHATE 10 MG/ML IJ SOLN
INTRAMUSCULAR | Status: DC | PRN
  Administered 2023-06-27: 5 mg via INTRAVENOUS

## 2023-06-27 MED ORDER — 0.9 % SODIUM CHLORIDE (POUR BTL) OPTIME
TOPICAL | Status: DC | PRN
  Administered 2023-06-27: 1000 mL

## 2023-06-27 MED ORDER — ORAL CARE MOUTH RINSE: 15.0000 mL | Freq: Once | OROMUCOSAL | Status: AC

## 2023-06-27 MED ORDER — TRANEXAMIC ACID-NACL 1000-0.7 MG/100ML-% IV SOLN
1000.0000 mg | INTRAVENOUS | Status: AC
  Administered 2023-06-27: 1000 mg via INTRAVENOUS

## 2023-06-27 MED ORDER — FENTANYL CITRATE (PF) 100 MCG/2ML IJ SOLN
INTRAMUSCULAR | Status: AC
  Filled 2023-06-27: qty 2

## 2023-06-27 MED ORDER — CEFAZOLIN SODIUM-DEXTROSE 2-4 GM/100ML-% IV SOLN
2.0000 g | INTRAVENOUS | Status: AC
  Administered 2023-06-27: 2 g via INTRAVENOUS

## 2023-06-27 MED ORDER — DIPHENHYDRAMINE HCL 50 MG/ML IJ SOLN
12.5000 mg | Freq: Once | INTRAMUSCULAR | Status: AC
  Administered 2023-06-27: 12.5 mg via INTRAVENOUS

## 2023-06-27 MED ORDER — CEFAZOLIN SODIUM-DEXTROSE 2-4 GM/100ML-% IV SOLN
INTRAVENOUS | Status: AC
  Filled 2023-06-27: qty 100

## 2023-06-27 MED ORDER — METHYLPREDNISOLONE ACETATE 40 MG/ML INJ SUSP (RADIOLOG
INTRAMUSCULAR | Status: DC | PRN
  Administered 2023-06-27: 40 mg

## 2023-06-27 MED ORDER — MIDAZOLAM HCL 2 MG/2ML IJ SOLN
INTRAMUSCULAR | Status: DC | PRN
  Administered 2023-06-27: 2 mg via INTRAVENOUS

## 2023-06-27 MED ORDER — TRANEXAMIC ACID-NACL 1000-0.7 MG/100ML-% IV SOLN
INTRAVENOUS | Status: AC
  Filled 2023-06-27: qty 100

## 2023-06-27 MED ORDER — BUPIVACAINE-EPINEPHRINE 0.25% -1:200000 IJ SOLN
INTRAMUSCULAR | Status: DC | PRN
  Administered 2023-06-27: 10 mL

## 2023-06-27 MED ORDER — ONDANSETRON HCL 4 MG PO TABS
4.0000 mg | ORAL_TABLET | Freq: Four times a day (QID) | ORAL | Status: DC | PRN
  Administered 2023-07-01 – 2023-07-02 (×3): 4 mg via ORAL
  Filled 2023-06-27 (×3): qty 1

## 2023-06-27 MED ORDER — SODIUM CHLORIDE 0.9 % IV SOLN
250.0000 mL | INTRAVENOUS | Status: DC
  Administered 2023-06-27: 250 mL via INTRAVENOUS

## 2023-06-27 MED ORDER — OXYCODONE HCL 5 MG PO TABS
10.0000 mg | ORAL_TABLET | ORAL | Status: DC | PRN
  Administered 2023-06-27 – 2023-07-02 (×20): 10 mg via ORAL
  Filled 2023-06-27 (×20): qty 2

## 2023-06-27 MED ORDER — ACETAMINOPHEN 650 MG RE SUPP: 650.0000 mg | RECTAL | Status: DC | PRN

## 2023-06-27 MED ORDER — LIDOCAINE 2% (20 MG/ML) 5 ML SYRINGE
INTRAMUSCULAR | Status: DC | PRN
  Administered 2023-06-27: 60 mg via INTRAVENOUS

## 2023-06-27 MED ORDER — MIDAZOLAM HCL 2 MG/2ML IJ SOLN
INTRAMUSCULAR | Status: AC
  Filled 2023-06-27: qty 2

## 2023-06-27 MED ORDER — FENTANYL CITRATE (PF) 250 MCG/5ML IJ SOLN
INTRAMUSCULAR | Status: DC | PRN
  Administered 2023-06-27: 100 ug via INTRAVENOUS
  Administered 2023-06-27 (×3): 50 ug via INTRAVENOUS

## 2023-06-27 MED ORDER — SODIUM CHLORIDE 0.9% FLUSH: 3.0000 mL | INTRAVENOUS | Status: DC | PRN

## 2023-06-27 MED ORDER — METHOCARBAMOL 500 MG PO TABS
500.0000 mg | ORAL_TABLET | Freq: Four times a day (QID) | ORAL | Status: DC | PRN
  Administered 2023-06-27 – 2023-07-02 (×11): 500 mg via ORAL
  Filled 2023-06-27 (×12): qty 1

## 2023-06-27 MED ORDER — ACETAMINOPHEN 325 MG PO TABS
650.0000 mg | ORAL_TABLET | ORAL | Status: DC | PRN
  Administered 2023-06-28 – 2023-07-01 (×2): 650 mg via ORAL
  Filled 2023-06-27 (×2): qty 2

## 2023-06-27 MED ORDER — SODIUM CHLORIDE 0.9% FLUSH
3.0000 mL | Freq: Two times a day (BID) | INTRAVENOUS | Status: DC
  Administered 2023-06-27: 3 mL via INTRAVENOUS

## 2023-06-27 MED ORDER — GLYCOPYRROLATE 0.2 MG/ML IJ SOLN
INTRAMUSCULAR | Status: DC | PRN
  Administered 2023-06-27: .2 mg via INTRAVENOUS

## 2023-06-27 MED ORDER — HYDROMORPHONE HCL 1 MG/ML IJ SOLN
INTRAMUSCULAR | Status: DC | PRN
  Administered 2023-06-27 (×3): .5 mg via INTRAVENOUS

## 2023-06-27 SURGICAL SUPPLY — 59 items
AGENT HMST KT MTR STRL THRMB (HEMOSTASIS) ×1
BAG COUNTER SPONGE SURGICOUNT (BAG) ×1 IMPLANT
BAG SPNG CNTER NS LX DISP (BAG) ×1
BNDG GAUZE DERMACEA FLUFF 4 (GAUZE/BANDAGES/DRESSINGS) ×1 IMPLANT
BNDG GZE DERMACEA 4 6PLY (GAUZE/BANDAGES/DRESSINGS)
CANISTER SUCT 3000ML PPV (MISCELLANEOUS) ×1 IMPLANT
CLSR STERI-STRIP ANTIMIC 1/2X4 (GAUZE/BANDAGES/DRESSINGS) ×1 IMPLANT
CORD BIPOLAR FORCEPS 12FT (ELECTRODE) ×1 IMPLANT
COVER SURGICAL LIGHT HANDLE (MISCELLANEOUS) ×1 IMPLANT
DRAIN CHANNEL 15F RND FF W/TCR (WOUND CARE) IMPLANT
DRAPE SURG 17X23 STRL (DRAPES) ×1 IMPLANT
DRAPE U-SHAPE 47X51 STRL (DRAPES) ×1 IMPLANT
DRSG OPSITE POSTOP 3X4 (GAUZE/BANDAGES/DRESSINGS) ×1 IMPLANT
DRSG OPSITE POSTOP 4X6 (GAUZE/BANDAGES/DRESSINGS) IMPLANT
DURAPREP 26ML APPLICATOR (WOUND CARE) ×1 IMPLANT
ELECT BLADE 4.0 EZ CLEAN MEGAD (MISCELLANEOUS)
ELECT CAUTERY BLADE 6.4 (BLADE) ×1 IMPLANT
ELECT PENCIL ROCKER SW 15FT (MISCELLANEOUS) ×1 IMPLANT
ELECT REM PT RETURN 9FT ADLT (ELECTROSURGICAL) ×1
ELECTRODE BLDE 4.0 EZ CLN MEGD (MISCELLANEOUS) IMPLANT
ELECTRODE REM PT RTRN 9FT ADLT (ELECTROSURGICAL) ×1 IMPLANT
EVACUATOR SILICONE 100CC (DRAIN) IMPLANT
GLOVE BIO SURGEON STRL SZ 6.5 (GLOVE) ×1 IMPLANT
GLOVE BIOGEL PI IND STRL 6.5 (GLOVE) ×1 IMPLANT
GLOVE BIOGEL PI IND STRL 8.5 (GLOVE) ×1 IMPLANT
GLOVE SS BIOGEL STRL SZ 8.5 (GLOVE) ×1 IMPLANT
GOWN STRL REUS W/ TWL LRG LVL3 (GOWN DISPOSABLE) ×2 IMPLANT
GOWN STRL REUS W/TWL 2XL LVL3 (GOWN DISPOSABLE) ×1 IMPLANT
GOWN STRL REUS W/TWL LRG LVL3 (GOWN DISPOSABLE) ×2
KIT BASIN OR (CUSTOM PROCEDURE TRAY) ×1 IMPLANT
KIT TURNOVER KIT B (KITS) ×1 IMPLANT
NDL 22X1.5 STRL (OR ONLY) (MISCELLANEOUS) ×1 IMPLANT
NDL SPNL 18GX3.5 QUINCKE PK (NEEDLE) ×2 IMPLANT
NEEDLE 22X1.5 STRL (OR ONLY) (MISCELLANEOUS) ×1
NEEDLE SPNL 18GX3.5 QUINCKE PK (NEEDLE) ×2
NS IRRIG 1000ML POUR BTL (IV SOLUTION) ×1 IMPLANT
PACK LAMINECTOMY ORTHO (CUSTOM PROCEDURE TRAY) ×1 IMPLANT
PACK UNIVERSAL I (CUSTOM PROCEDURE TRAY) ×1 IMPLANT
PAD ARMBOARD 7.5X6 YLW CONV (MISCELLANEOUS) ×2 IMPLANT
PATTIES SURGICAL .5 X.5 (GAUZE/BANDAGES/DRESSINGS) ×1 IMPLANT
PATTIES SURGICAL .5 X1 (DISPOSABLE) ×1 IMPLANT
SPONGE SURGIFOAM ABS GEL 100 (HEMOSTASIS) IMPLANT
SPONGE T-LAP 4X18 ~~LOC~~+RFID (SPONGE) ×3 IMPLANT
SURGIFLO W/THROMBIN 8M KIT (HEMOSTASIS) IMPLANT
SUT BONE WAX W31G (SUTURE) ×1 IMPLANT
SUT MNCRL+ AB 3-0 CT1 36 (SUTURE) ×1 IMPLANT
SUT STRATAFIX 1PDS 45CM VIOLET (SUTURE) IMPLANT
SUT VIC AB 0 CT1 27 (SUTURE) ×1
SUT VIC AB 0 CT1 27XBRD ANBCTR (SUTURE) IMPLANT
SUT VIC AB 1 CT1 18XCR BRD 8 (SUTURE) ×1 IMPLANT
SUT VIC AB 1 CT1 8-18 (SUTURE)
SUT VIC AB 2-0 CT1 18 (SUTURE) ×1 IMPLANT
SUT VIC AB CT1 27XBRD ANBCTRL (SUTURE) ×1
SYR BULB IRRIG 60ML STRL (SYRINGE) ×1 IMPLANT
SYR CONTROL 10ML LL (SYRINGE) ×1 IMPLANT
TOWEL GREEN STERILE (TOWEL DISPOSABLE) ×1 IMPLANT
TOWEL GREEN STERILE FF (TOWEL DISPOSABLE) ×1 IMPLANT
WATER STERILE IRR 1000ML POUR (IV SOLUTION) ×1 IMPLANT
YANKAUER SUCT BULB TIP NO VENT (SUCTIONS) IMPLANT

## 2023-06-27 NOTE — H&P (Signed)
History:  Melanie Hancock is a very pleasant 27 year old man who returns to see me with progressive bilateral lower extremity radiculopathy. Symptoms have persisted for greater than a year. Approximately 4 months ago I had discussed surgery and she elected not to move forward at that time. However, she states now the pain is gotten worse as has her lower extremity weakness and she is ready to move forward with surgery.  Past Medical History:  Diagnosis Date   Anemia    Anxiety    Asthma    Bipolar disorder (HCC)    Blood transfusion without reported diagnosis    Chronic back pain    Depression    GERD (gastroesophageal reflux disease)    Headache    Post partum depression    Pyelonephritis    UTI (lower urinary tract infection)     Allergies  Allergen Reactions   Iodine     Pt states it burns and itches     Latex Itching   Mushroom Extract Complex     Throat itches     No current facility-administered medications on file prior to encounter.   Current Outpatient Medications on File Prior to Encounter  Medication Sig Dispense Refill   cyclobenzaprine (FLEXERIL) 10 MG tablet Take 10 mg by mouth 3 (three) times daily as needed for muscle spasms.     cyclobenzaprine (FLEXERIL) 5 MG tablet Take 5 mg by mouth 3 (three) times daily as needed for muscle spasms.     fluticasone (FLONASE) 50 MCG/ACT nasal spray Place 2 sprays into both nostrils daily as needed for allergies.     gabapentin (NEURONTIN) 300 MG capsule Take 600 mg by mouth 2 (two) times daily.     metroNIDAZOLE (FLAGYL) 500 MG tablet Take 500 mg by mouth 2 (two) times daily.     naproxen (NAPROSYN) 500 MG tablet Take 500 mg by mouth 2 (two) times daily as needed for moderate pain (pain score 4-6).     risperiDONE (RISPERDAL) 0.5 MG tablet Take 0.5 mg by mouth at bedtime as needed (depression).     triamcinolone ointment (KENALOG) 0.5 % Apply 1 Application topically 2 (two) times daily.     valACYclovir (VALTREX) 1000 MG  tablet Take 1,000 mg by mouth daily as needed (outbreaks).     acetaminophen (TYLENOL) 500 MG tablet Take 1,000 mg by mouth every 6 (six) hours as needed for moderate pain (pain score 4-6).     ketorolac (TORADOL) 10 MG tablet Take 1 tablet (10 mg total) by mouth every 6 (six) hours as needed (pain). 20 tablet 0   [DISCONTINUED] pantoprazole (PROTONIX) 20 MG tablet Take 1 tablet (20 mg total) by mouth daily. 30 tablet 2    Physical Exam: Vitals:   06/27/23 1039  BP: 99/62  Pulse: 70  Resp: 18  Temp: 97.9 F (36.6 C)  SpO2: 100%   Body mass index is 29.12 kg/m. Clinical exam: Melanie Hancock is a pleasant individual, who appears younger than their stated age.  She is alert and orientated 3.  No shortness of breath, chest pain.  Abdomen is soft and non-tender, negative loss of bowel and bladder control, no rebound tenderness.  Negative: skin lesions abrasions contusions  Peripheral pulses: 2+ peripheral pulses bilaterally. LE compartments are: Soft and nontender.  Gait pattern: Antalgic gait pattern due to right foot drop  Assistive devices: None  Neuro: 3/5 right gastrocnemius strength. Positive numbness and dysesthesias in the right S1 dermatome. Remainder of the right lower extremity  is intact on motor and sensory exam. 5/5 motor strength in the left lower extremity. Normal sensation to light touch throughout the left lower extremity. Positive bilateral straight leg raise test with reproduction of radicular S1 pain. Symmetrical 2+ deep tendon reflexes. Negative Babinski test, no clonus.  Musculoskeletal: Moderate low back pain with palpation and range of motion. Pain is in the midline radiating into the paraspinal region.  Imaging: X-rays of the lumbar spine demonstrate slight scoliosis at the thoracolumbar region. No spondylolisthesis. Mild to moderate degenerative disc disease and slight retrolisthesis at L5-S1.  Lumbar MRI: completed on 12/14/2022. Right-sided disc herniation at  L5-S1 causing compression of the S1 nerve root. Mild degenerative changes at L4-5. No fracture is noted.  Lumbar MRI: completed on 06/04/2023.  Increased size of the L5-S1 disc herniation. Now producing severe central and bilateral recess stenosis affecting both traversing S1 nerve roots. There is also some foraminal compromise affecting the exiting L5 nerve root. Unchanged mild to moderate central stenosis at L4-5 with mild left lateral recess stenosis. Unchanged disc bulge at L3-4 with only mild stenosis.  A/P: Melanie Hancock is a very pleasant 27 year old woman who injured herself at work approximately 12 to 14 months ago. She ultimately presented to my care with an MRI that demonstrated a large L5-S1 disc herniation. At that time she had neurological deficits with a foot drop and radicular pain. She ultimately decided against surgery and had injection therapy but unfortunately her quality of life is continued to deteriorate. She presents now with progressive bilateral lower extremity neuropathic pain as well as ongoing motor deficits. Imaging studies show increased size of the disc herniation at L5-S1 with greater stenosis and neural compression.  At this point time she is indicated she would like to move forward with surgery. I have again gone over the risks and benefits of surgery as well as the surgical plan. She is expressed an understanding and willingness to move forward with surgery. Surgical plan would be a central decompression consisting of a central laminotomy and lateral recess decompression followed by discectomy. Because of the large central protrusion I do think that approaching it from both sides will allow for a more comprehensive discectomy.  Risks and benefits of decompression/discectomy: Infection, bleeding, death, stroke, paralysis, ongoing or worse pain, need for additional surgery, leak of spinal fluid, adjacent segment degeneration requiring additional surgery, post-operative hematoma  formation that can result in neurological compromise and the need for urgent/emergent re-operation. Loss in bowel and bladder control. Injury to major vessels that could result in the need for urgent abdominal surgery to stop bleeding. Risk of deep venous thrombosis (DVT) and the need for additional treatment. Recurrent disc herniation resulting in the need for revision surgery, which could include fusion surgery (utilizing instrumentation such as pedicle screws and intervertebral cages).

## 2023-06-27 NOTE — Discharge Instructions (Signed)

## 2023-06-27 NOTE — Transfer of Care (Signed)
Immediate Anesthesia Transfer of Care Note  Patient: Melanie Hancock  Procedure(s) Performed: L5-S1 DECOMPRESSION WITH BILATERAL FORAMINOTOMIES (Bilateral: Spine Lumbar)  Patient Location: PACU  Anesthesia Type:General  Level of Consciousness: awake and patient cooperative  Airway & Oxygen Therapy: Patient Spontanous Breathing  Post-op Assessment: Report given to RN  Post vital signs: stable  Last Vitals:  Vitals Value Taken Time  BP 122/70 06/27/23 1457  Temp    Pulse 67 06/27/23 1508  Resp 23 06/27/23 1508  SpO2 100 % 06/27/23 1508  Vitals shown include unfiled device data.  Last Pain:  Vitals:   06/27/23 1104  PainSc: 8       Patients Stated Pain Goal: 1 (06/27/23 1104)  Complications: No notable events documented.

## 2023-06-27 NOTE — Anesthesia Postprocedure Evaluation (Signed)
Anesthesia Post Note  Patient: Melanie Hancock  Procedure(s) Performed: L5-S1 DECOMPRESSION WITH BILATERAL FORAMINOTOMIES (Bilateral: Spine Lumbar)     Patient location during evaluation: PACU Anesthesia Type: General Level of consciousness: awake Pain management: pain level controlled Vital Signs Assessment: post-procedure vital signs reviewed and stable Respiratory status: spontaneous breathing, nonlabored ventilation and respiratory function stable Cardiovascular status: blood pressure returned to baseline and stable Postop Assessment: no apparent nausea or vomiting Anesthetic complications: no   No notable events documented.  Last Vitals:  Vitals:   06/27/23 1639 06/27/23 1946  BP: 117/66 124/73  Pulse: (!) 55 (!) 59  Resp: 20 18  Temp: 36.7 C 37.2 C  SpO2: 100% 100%    Last Pain:  Vitals:   06/27/23 1946  TempSrc: Oral  PainSc:                  Linton Rump

## 2023-06-27 NOTE — Plan of Care (Signed)

## 2023-06-27 NOTE — Anesthesia Procedure Notes (Signed)
Procedure Name: Intubation Date/Time: 06/27/2023 12:05 PM  Performed by: Halina Andreas, CRNAPre-anesthesia Checklist: Patient identified, Emergency Drugs available, Suction available and Patient being monitored Patient Re-evaluated:Patient Re-evaluated prior to induction Oxygen Delivery Method: Circle system utilized Preoxygenation: Pre-oxygenation with 100% oxygen Induction Type: IV induction Ventilation: Mask ventilation without difficulty Laryngoscope Size: Glidescope and 3 Grade View: Grade I Tube type: Oral Tube size: 7.0 mm Number of attempts: 1 Airway Equipment and Method: Stylet Placement Confirmation: ETT inserted through vocal cords under direct vision Secured at: 22 cm Tube secured with: Tape Dental Injury: Teeth and Oropharynx as per pre-operative assessment

## 2023-06-27 NOTE — Brief Op Note (Signed)
06/27/2023  2:45 PM  PATIENT:  Melanie Hancock  27 y.o. female  PRE-OPERATIVE DIAGNOSIS:  L5-S1 herniated disc with radiculopathy  POST-OPERATIVE DIAGNOSIS:  L5-S1 herniated disc with radiculopathy  PROCEDURE:  Procedure(s) with comments: L5-S1 DECOMPRESSION WITH BILATERAL FORAMINOTOMIES (Bilateral) - 2.5 hrs 3 C-Bed  SURGEON:  Surgeons and Role:    Venita Lick, MD - Primary  PHYSICIAN ASSISTANT:   ASSISTANTS: Luther Bradley  ANESTHESIA:   general  EBL:  40 mL   BLOOD ADMINISTERED:none  DRAINS: none   LOCAL MEDICATIONS USED:  MARCAINE   & depomedrol  SPECIMEN:  No Specimen  DISPOSITION OF SPECIMEN:  N/A  COUNTS:  YES  TOURNIQUET:  * No tourniquets in log *  DICTATION: .Dragon Dictation  PLAN OF CARE: Admit for overnight observation  PATIENT DISPOSITION:  PACU - hemodynamically stable.

## 2023-06-27 NOTE — Op Note (Signed)
OPERATIVE REPORT  DATE OF SURGERY: 06/27/2023  PATIENT NAME:  Melanie Hancock MRN: [id number] DOB: Sep 16, 1995  PCP: Department, [facility]  PRE-OPERATIVE DIAGNOSIS: Large central L5-S1 disc herniation with bilateral foraminal stenosis and nerve compression left side worse than the right.  POST-OPERATIVE DIAGNOSIS: Same  PROCEDURE:   L5-S1 decompression with bilateral medial facetectomies and resection of the large  foraminotomies.  Resection of large disc herniation from the left side L5-S1  SURGEON:  Venita Lick, MD  PHYSICIAN ASSISTANT: Luther Bradley  ANESTHESIA:   General  EBL: 40 ml   Complications: None none  BRIEF HISTORY: Melanie Hancock is a 27 y.o. female who has had significant back buttock and bilateral radicular leg pain left side worse than the right for almost 14 months.  The patient has also had a left foot drop secondary to S1 neural compression.  Patient's MRI demonstrates a large central disc herniation creating severe stenosis.  As a result of the progressive nature of her pain and loss in quality of life she is elected to move forward with surgery.  All appropriate risks, benefits, alternatives to surgery were discussed with the patient and consent was obtained.  PROCEDURE DETAILS: Patient was brought into the operating room and was properly positioned on the operating room table.  After induction with general anesthesia the patient was endotracheally intubated.  A timeout was taken to confirm all important data: including patient, procedure, and the level. Teds, SCD's were applied.   A Foley was placed by the nurse and the patient was turned prone onto the Wilson frame.  All bony prominences well-padded and the back was then prepped and draped in a standard fashion.  2 needles were placed into the back and an x-ray was taken to identify our incision.  I marked out the L5-S1 space and infiltrated it with quarter percent Marcaine with epinephrine.  Midline  incision was made and sharp dissection was carried out to the deep fascia.  Deep fascia was sharply incised and I stripped the paraspinal muscles bilaterally to expose the L5 and S1 spinous processes as well as the L5-S1 facet complex.  Care was taken not to violate the facet capsule.  A Penfield 4 was placed underneath the L5 lamina and an x-ray was taken and read by the radiologist confirming that we are at the appropriate level.  Because of the severity of the central stenosis I elected to do a central decompression of the central interspinous process ligament was resected and I dissected through very thickened ligamentum flavum.  I was able to dissect down and advanced through the ligamentum flavum and create a plane between the thecal sac and the ligamentum flavum.  Using Kerrison rongeurs I removed the ligamentum flavum and performed a generous left-sided hemilaminotomy of L5.  I then continued dissecting into the lateral recess gently mobilizing the thecal sac and removing the medial portion of the facet.  A very large disc herniation came into view.  The S1 nerve root and thecal sac were markedly compressed.  I continued to work on decompressing the lateral recess by resecting the medial portion of the facet.  Once I had an adequate medial facetectomy I was able to identify the S1 pedicle and then gently continued to decompress the S1 nerve root.  At this point I was able to now mobilize the thecal sac and nerve root medially and completely expose the disc herniation.  Annulotomy was performed and 3 very large fragments of disc material were  easily removed.  Using a nerve hook I mobilized the fragments and removed them with a pituitary rongeur.  After removing large fragments of disc material I could now easily mobilized the S1 nerve root.  I continued to perform a medial foraminotomy.  At this point I then began mobilizing into the right lateral recess.  The ligamentum flavum over the right sided thecal  sac was removed with a Kerrison rongeur and I was able to palpate down into the lateral recess.  A medial facetectomy on the right side was performed with a 2 mm Kerrison rongeur.  I then identified the right S1 nerve root and performed a medial foraminotomy.  At this point I could easily pass my nerve hook along the path of the right S1 nerve root into the foramen and superiorly and along the undersurface of the thecal sac.  I could not palpate any residual disc herniation on the right side.  Based on the MRI the disc herniation was central but it did progress towards the right side.  I could not identify any significant further central stenosis.  On the left side I could easily pass a nerve hook along the medial aspect of the annulus underneath the thecal sac superiorly and inferiorly in the lateral recess.  The S1 nerve root was completely free of mobile and I could easily track it into the left S1 foramen.  At this point I was quite pleased with the discectomy and decompression.  The wound was copiously irrigated with normal saline and the epidural veins were identified and coagulated with bipolar electrocautery.  After final irrigation and confirmation of hemostasis I did place approximately 40 mg (1 cc) of Depo-Medrol over the thecal sac and nerve root to aid in her postoperative analgesia.  Thrombin-soaked Gelfoam patty was placed over the laminotomy site and the retractors were removed.  The deep fascia was closed with a #1 strata fix and then I closed in a layered fashion with a running 0 Vicryl followed by interrupted 2-0 Vicryl sutures.  3-0 Monocryl was used to reapproximate the skin edges.  Steri-Strips dry dressings were applied and the patient was ultimately extubated transferred the PACU without incident.  The end of the case all needle sponge counts were correct.  Venita Lick, MD 06/27/2023 2:33 PM

## 2023-06-28 ENCOUNTER — Encounter (HOSPITAL_COMMUNITY): Payer: Self-pay | Admitting: Orthopedic Surgery

## 2023-06-28 DIAGNOSIS — M5117 Intervertebral disc disorders with radiculopathy, lumbosacral region: Secondary | ICD-10-CM | POA: Diagnosis not present

## 2023-06-28 MED ORDER — BISACODYL 5 MG PO TBEC
5.0000 mg | DELAYED_RELEASE_TABLET | Freq: Every day | ORAL | Status: DC | PRN
  Administered 2023-06-28 – 2023-07-01 (×2): 5 mg via ORAL
  Filled 2023-06-28 (×2): qty 1

## 2023-06-28 MED ORDER — ZOLPIDEM TARTRATE 5 MG PO TABS
5.0000 mg | ORAL_TABLET | Freq: Every evening | ORAL | Status: DC | PRN
  Administered 2023-06-28 – 2023-07-02 (×4): 5 mg via ORAL
  Filled 2023-06-28 (×5): qty 1

## 2023-06-28 MED ORDER — DOCUSATE SODIUM 100 MG PO CAPS
100.0000 mg | ORAL_CAPSULE | Freq: Two times a day (BID) | ORAL | Status: DC
  Administered 2023-06-28 – 2023-07-02 (×6): 100 mg via ORAL
  Filled 2023-06-28 (×7): qty 1

## 2023-06-28 MED ORDER — FLEET ENEMA RE ENEM
1.0000 | ENEMA | Freq: Every day | RECTAL | Status: DC | PRN
  Administered 2023-06-29: 1 via RECTAL
  Filled 2023-06-28 (×2): qty 1

## 2023-06-28 NOTE — Progress Notes (Signed)
    Durable Medical Equipment  (From admission, onward)           Start     Ordered   06/28/23 1128  For home use only DME Tub bench  Once        06/28/23 1127   06/28/23 1127  For home use only DME Walker rolling  Once       Question Answer Comment  Walker: With 5 Inch Wheels   Patient needs a walker to treat with the following condition Weakness      06/28/23 1127   06/28/23 1127  For home use only DME Bedside commode  Once       Question:  Patient needs a bedside commode to treat with the following condition  Answer:  Weakness   06/28/23 1127            The patient is confined to one level of the home environment and there is no toilet on the that level of the home.

## 2023-06-28 NOTE — Plan of Care (Signed)
  Problem: Education: Goal: Knowledge of General Education information will improve Description: Including pain rating scale, medication(s)/side effects and non-pharmacologic comfort measures Outcome: Progressing   Problem: Health Behavior/Discharge Planning: Goal: Ability to manage health-related needs will improve Outcome: Progressing   Problem: Clinical Measurements: Goal: Ability to maintain clinical measurements within normal limits will improve Outcome: Progressing Goal: Will remain free from infection Outcome: Progressing Goal: Diagnostic test results will improve Outcome: Progressing Goal: Respiratory complications will improve Outcome: Progressing Goal: Cardiovascular complication will be avoided Outcome: Progressing   Problem: Activity: Goal: Risk for activity intolerance will decrease Outcome: Progressing   Problem: Nutrition: Goal: Adequate nutrition will be maintained Outcome: Progressing   Problem: Coping: Goal: Level of anxiety will decrease Outcome: Progressing   Problem: Elimination: Goal: Will not experience complications related to bowel motility Outcome: Progressing Goal: Will not experience complications related to urinary retention Outcome: Progressing   Problem: Pain Management: Goal: General experience of comfort will improve Outcome: Progressing   Problem: Safety: Goal: Ability to remain free from injury will improve Outcome: Progressing   Problem: Skin Integrity: Goal: Risk for impaired skin integrity will decrease Outcome: Progressing   Problem: Education: Goal: Ability to verbalize activity precautions or restrictions will improve Outcome: Progressing Goal: Knowledge of the prescribed therapeutic regimen will improve Outcome: Progressing Goal: Understanding of discharge needs will improve Outcome: Progressing   Problem: Activity: Goal: Ability to avoid complications of mobility impairment will improve Outcome: Progressing Goal:  Ability to tolerate increased activity will improve Outcome: Progressing Goal: Will remain free from falls Outcome: Progressing   Problem: Bowel/Gastric: Goal: Gastrointestinal status for postoperative course will improve Outcome: Progressing   Problem: Clinical Measurements: Goal: Ability to maintain clinical measurements within normal limits will improve Outcome: Progressing Goal: Postoperative complications will be avoided or minimized Outcome: Progressing Goal: Diagnostic test results will improve Outcome: Progressing   Problem: Pain Management: Goal: Pain level will decrease Outcome: Progressing   Problem: Skin Integrity: Goal: Will show signs of wound healing Outcome: Progressing

## 2023-06-28 NOTE — Discharge Summary (Signed)
Patient ID: Melanie Hancock MRN: [id number] DOB/AGE: 1995/10/04 28 y.o.  Admit date: 06/27/2023 Discharge date: 06/28/2023  Admission Diagnoses:  Principal Problem:   Lumbar disc herniation   Discharge Diagnoses:  Principal Problem:   Lumbar disc herniation  status post Procedure(s): L5-S1 DECOMPRESSION WITH BILATERAL FORAMINOTOMIES  Past Medical History:  Diagnosis Date   Anemia    Anxiety    Asthma    Bipolar disorder (HCC)    Blood transfusion without reported diagnosis    Chronic back pain    Depression    GERD (gastroesophageal reflux disease)    Headache    Post partum depression    Pyelonephritis    UTI (lower urinary tract infection)     Surgeries: Procedure(s): L5-S1 DECOMPRESSION WITH BILATERAL FORAMINOTOMIES on 06/27/2023   Consultants:   Discharged Condition: Improved  Hospital Course: Melanie Hancock is an 27 y.o. female who was admitted 06/27/2023 for operative treatment of Lumbar disc herniation. Patient failed conservative treatments (please see the history and physical for the specifics) and had severe unremitting pain that affects sleep, daily activities and work/hobbies. After pre-op clearance, the patient was taken to the operating room on 06/27/2023 and underwent  Procedure(s): L5-S1 DECOMPRESSION WITH BILATERAL FORAMINOTOMIES.    Patient was given perioperative antibiotics:  Anti-infectives (From admission, onward)    Start     Dose/Rate Route Frequency Ordered Stop   06/27/23 2000  ceFAZolin (ANCEF) IVPB 1 g/50 mL premix        1 g 100 mL/hr over 30 Minutes Intravenous Every 8 hours 06/27/23 1607 06/28/23 0301   06/27/23 1143  ceFAZolin (ANCEF) 2-4 GM/100ML-% IVPB       Note to Pharmacy: Dorann Ou N: cabinet override      06/27/23 1143 06/27/23 1225   06/27/23 1119  ceFAZolin (ANCEF) IVPB 2g/100 mL premix        2 g 200 mL/hr over 30 Minutes Intravenous 30 min pre-op 06/27/23 1119 06/27/23 1220        Patient was given sequential  compression devices and early ambulation to prevent DVT.   Patient benefited maximally from hospital stay and there were no complications. At the time of discharge, the patient was urinating/moving their bowels without difficulty, tolerating a regular diet, pain is controlled with oral pain medications and they have been cleared by PT/OT.   Recent vital signs: Patient Vitals for the past 24 hrs:  BP Temp Temp src Pulse Resp SpO2 Height Weight  06/28/23 0715 106/76 98.5 F (36.9 C) Oral 61 20 100 % -- --  06/28/23 0357 (!) 113/52 98.4 F (36.9 C) Oral (!) 56 18 100 % -- --  06/27/23 2258 (!) 119/51 98.6 F (37 C) Oral (!) 56 18 100 % -- --  06/27/23 1946 124/73 99 F (37.2 C) Oral (!) 59 18 100 % -- --  06/27/23 1639 117/66 98 F (36.7 C) -- (!) 55 20 100 % -- --  06/27/23 1615 111/68 98.8 F (37.1 C) -- (!) 56 13 100 % -- --  06/27/23 1600 112/73 -- -- 75 15 100 % -- --  06/27/23 1545 119/67 -- -- 60 (!) 24 100 % -- --  06/27/23 1530 103/77 -- -- 62 18 100 % -- --  06/27/23 1515 119/81 -- -- (!) 59 15 96 % -- --  06/27/23 1500 122/70 99 F (37.2 C) -- -- 11 100 % -- --  06/27/23 1039 99/62 97.9 F (36.6 C) -- 70 18 100 % 5'  5" (1.651 m) 79.4 kg     Recent laboratory studies: No results for input(s): "WBC", "HGB", "HCT", "PLT", "NA", "K", "CL", "CO2", "BUN", "CREATININE", "GLUCOSE", "INR", "CALCIUM" in the last 72 hours.  Invalid input(s): "PT", "2"   Discharge Medications:   Allergies as of 06/28/2023       Reactions   Iodine    Pt states it burns and itches    Latex Itching   Mushroom Extract Complex    Throat itches         Medication List     STOP taking these medications    cyclobenzaprine 10 MG tablet Commonly known as: FLEXERIL   cyclobenzaprine 5 MG tablet Commonly known as: FLEXERIL   ketorolac 10 MG tablet Commonly known as: TORADOL   metroNIDAZOLE 500 MG tablet Commonly known as: FLAGYL   naproxen 500 MG tablet Commonly known as: NAPROSYN    triamcinolone ointment 0.5 % Commonly known as: KENALOG   TYLENOL 500 MG tablet Generic drug: acetaminophen   valACYclovir 1000 MG tablet Commonly known as: VALTREX       TAKE these medications    fluticasone 50 MCG/ACT nasal spray Commonly known as: FLONASE Place 2 sprays into both nostrils daily as needed for allergies.   gabapentin 300 MG capsule Commonly known as: Neurontin Take 1 capsule (300 mg total) by mouth 3 (three) times daily as needed for up to 14 days. What changed:  how much to take when to take this reasons to take this   ondansetron 4 MG tablet Commonly known as: Zofran Take 1 tablet (4 mg total) by mouth every 8 (eight) hours as needed for nausea or vomiting.   oxyCODONE-acetaminophen 10-325 MG tablet Commonly known as: Percocet Take 1 tablet by mouth every 6 (six) hours as needed for up to 5 days for pain.   risperiDONE 0.5 MG tablet Commonly known as: RISPERDAL Take 0.5 mg by mouth at bedtime as needed (depression).        Diagnostic Studies: DG Lumbar Spine 2-3 Views  Result Date: 06/27/2023 CLINICAL DATA:  27 year old female undergoing lumbar surgery. EXAM: LUMBAR SPINE - 2-3 VIEW COMPARISON:  Lumbar radiographs 03/08/2017. FINDINGS: Normal segmentation on the 2018 comparison. 3 intraoperative portable cross-table lateral views of the lumbar spine: Film #1 at 1231 hours.  Needles project posteriorly at the sacrum. Film #2 at 1241 hours. Surgical probe projects along the posterior elements of L5, tip near the L5 pedicle level. Film #3 at 1246 hours. Surgical probe projects along the L5 inferior articulating facet at the level of the L5-S1 disc space. IMPRESSION: Intraoperative localization ultimately at the L5-S1 disc level. Electronically Signed   By: Odessa Fleming M.D.   On: 06/27/2023 12:53    Discharge Instructions     Incentive spirometry RT   Complete by: As directed         Follow-up Information     Venita Lick, MD. Schedule an  appointment as soon as possible for a visit in 2 week(s).   Specialty: Orthopedic Surgery Why: If symptoms worsen, For suture removal, For wound re-check Contact information: [address] [place] [address] [phone]                 Discharge Plan:  discharge to home  Disposition: Amillya has had significant neuropathic leg pain for almost 14 months secondary to a large L5-S1 disc herniation.  Ultimately we were able to take her to the operating for a decompression/discectomy.  This morning she  reports no radicular leg pain.  She has a negative nerve root tension sign, and no focal neurological deficits.  Her primary issue is incisional back pain.  The dressings are intact there is no signs of seroma or drainage.  Patient has been ambulating without difficulty.  Her pain is well-controlled with oral medications.  Plan on discharge to home.  Instructions have been provided.  She will follow-up with me in 2 weeks.    Signed: Alvy Beal for Dr. Venita Lick [facility] [phone] 06/28/2023, 7:34 AM

## 2023-06-28 NOTE — Evaluation (Signed)
Physical Therapy Evaluation Patient Details Name: Melanie Hancock MRN: [id number] DOB: 06-17-1996 Today's Date: 06/28/2023  History of Present Illness  The pt is a 27 yo female presenting 11/7 for L5-S1 decompression with bilateral foraminotomies. PMH includes: anemia, anxiety, bipolar disorder, depression, and UTI.   Clinical Impression  Pt in bed upon arrival of PT, agreeable to evaluation at this time. Prior to admission the pt was independent with mobility but limited by pain in her back and LLE. She lives in a 2nd floor apt with her 2 children (3yo and 7yo) with limited social/family support. The pt was able to complete sit-stand without assistance, but has significant difficulty from lower surfaces without use of UE despite reporting no issues with her LE at this time. The pt was able to complete 10 stairs with BUE support on rails and RLE leading, no overt buckling or significant change in pain. Walking limited by onset of spasm near her incision. Attempted to discuss family assist for IADLs and caring for her children, pt tearful stating limited support from her family/friends and that her mom is from out of town and only planning to stay for a few days. Will benefit from additional visit of acute PT to progress mobility and independence with transfers, anticipate pt will be safe to return home with some assist from family/friends.          If plan is discharge home, recommend the following: A little help with walking and/or transfers;A little help with bathing/dressing/bathroom;Help with stairs or ramp for entrance;Assistance with cooking/housework;Assist for transportation   Can travel by private vehicle        Equipment Recommendations Rolling walker (2 wheels);BSC/3in1  Recommendations for Other Services  OT consult    Functional Status Assessment Patient has had a recent decline in their functional status and demonstrates the ability to make significant improvements in function in  a reasonable and predictable amount of time.     Precautions / Restrictions Precautions Precautions: Back;Fall Precaution Booklet Issued: Yes (comment) Required Braces or Orthoses: Spinal Brace Spinal Brace: Lumbar corset;Applied in sitting position Restrictions Weight Bearing Restrictions: No      Mobility  Bed Mobility Overal bed mobility: Needs Assistance Bed Mobility: Rolling, Sidelying to Sit Rolling: Supervision Sidelying to sit: Supervision, Used rails       General bed mobility comments: cues for log roll    Transfers Overall transfer level: Needs assistance Equipment used: Rolling walker (2 wheels), None Transfers: Sit to/from Stand Sit to Stand: Contact guard assist           General transfer comment: CGA to supervision, pt dependent on UE to push up to standing from EOB and low toilet. reports increased pain in her back with standing and needing cues to use her legs to stand. pt states no issues with strength or pain in legs yet not using them to stand    Ambulation/Gait Ambulation/Gait assistance: Contact guard assist Gait Distance (Feet): 55 Feet Assistive device: None Gait Pattern/deviations: Step-through pattern, Decreased stride length Gait velocity: decreased Gait velocity interpretation: <1.31 ft/sec, indicative of household ambulator   General Gait Details: pt with slow but steady gait with small steps. no instability or toe drag noted.  Stairs Stairs: Yes Stairs assistance: Contact guard assist Stair Management: Two rails, Step to pattern, Forwards Number of Stairs: 10 General stair comments: RLE leading ascending, LLE leading descending. more pain with LLE on stairs today     Balance Overall balance assessment: Needs assistance Sitting-balance support: No  upper extremity supported, Feet supported Sitting balance-Leahy Scale: Good     Standing balance support: No upper extremity supported, During functional activity Standing  balance-Leahy Scale: Fair Standing balance comment: poor tolerance for challenge                             Pertinent Vitals/Pain Pain Assessment Pain Assessment: Faces Faces Pain Scale: Hurts whole lot Pain Location: incision, low back Pain Descriptors / Indicators: Discomfort, Cramping, Grimacing, Guarding Pain Intervention(s): Limited activity within patient's tolerance, Monitored during session, Repositioned    Home Living Family/patient expects to be discharged to:: Private residence Living Arrangements: Children Available Help at Discharge: Family;Available PRN/intermittently (dad can assist but doesn't drive, mom staying for a few days from out of town. pt reports other support is "unreliable") Type of Home: Apartment Home Access: Stairs to enter Entrance Stairs-Rails: Right;Left;Can reach both Entrance Stairs-Number of Steps: 16   Home Layout: One level Home Equipment: None Additional Comments: pt has sink next to toilet on L side she can push on    Prior Function Prior Level of Function : Needs assist             Mobility Comments: pt walking without DME, limited by back pain. 5yoo helped push laundry basket around to manage laundry. ADLs Comments: pt reports independnt but limited by pain     Extremity/Trunk Assessment   Upper Extremity Assessment Upper Extremity Assessment: Defer to OT evaluation    Lower Extremity Assessment Lower Extremity Assessment: RLE deficits/detail;LLE deficits/detail RLE Deficits / Details: WFL to MMT, but poor funcitonal use for sit-stand. no reports of sensation change. RLE: Unable to fully assess due to pain RLE Sensation: WNL LLE Deficits / Details: WFL to MMT, but poor funcitonal use for sit-stand. no reports of sensation change. LLE: Unable to fully assess due to pain LLE Sensation: WNL    Cervical / Trunk Assessment Cervical / Trunk Assessment: Back Surgery  Communication   Communication Communication: No  apparent difficulties Cueing Techniques: Verbal cues;Visual cues  Cognition Arousal: Alert Behavior During Therapy: WFL for tasks assessed/performed Overall Cognitive Status: Within Functional Limits for tasks assessed                                 General Comments: pt needing cues to plan through assist after d/c. tearful due to lack of support. pt needing increased cues for technique        General Comments General comments (skin integrity, edema, etc.): VSS on RA, pt educated on progressive walking, need for assistance with IADLs    Exercises     Assessment/Plan    PT Assessment Patient needs continued PT services  PT Problem List Decreased activity tolerance;Decreased balance;Decreased mobility;Pain       PT Treatment Interventions DME instruction;Gait training;Stair training;Functional mobility training;Therapeutic activities;Therapeutic exercise;Balance training    PT Goals (Current goals can be found in the Care Plan section)  Acute Rehab PT Goals Patient Stated Goal: return to independence, reduce pain PT Goal Formulation: With patient Time For Goal Achievement: 07/12/23 Potential to Achieve Goals: Good    Frequency Min 5X/week        AM-PAC PT "6 Clicks" Mobility  Outcome Measure Help needed turning from your back to your side while in a flat bed without using bedrails?: A Little Help needed moving from lying on your back to sitting on the side  of a flat bed without using bedrails?: A Little Help needed moving to and from a bed to a chair (including a wheelchair)?: A Little Help needed standing up from a chair using your arms (e.g., wheelchair or bedside chair)?: A Little Help needed to walk in hospital room?: A Little Help needed climbing 3-5 steps with a railing? : A Little 6 Click Score: 18    End of Session Equipment Utilized During Treatment: Gait belt;Back brace Activity Tolerance: Patient tolerated treatment well Patient left: in  bed;with call bell/phone within reach (sitting EOB) Nurse Communication: Mobility status PT Visit Diagnosis: Unsteadiness on feet (R26.81);Other abnormalities of gait and mobility (R26.89);Muscle weakness (generalized) (M62.81);Pain Pain - Right/Left: Left Pain - part of body:  (back and hip)    Time: 1610-9604 PT Time Calculation (min) (ACUTE ONLY): 37 min   Charges:   PT Evaluation $PT Eval Low Complexity: 1 Low PT Treatments $Therapeutic Exercise: 8-22 mins PT General Charges $$ ACUTE PT VISIT: 1 Visit         Vickki Muff, PT, DPT   Acute Rehabilitation Department Office [phone] Secure Chat Communication Preferred  Ronnie Derby 06/28/2023, 10:58 AM

## 2023-06-28 NOTE — Progress Notes (Signed)
Patient to transfer to 5N04. Report given to Receiving RN. Patient in no signs of distress at this time. Patient's room was checked and accounted for all patient's belongings were taking with patient to new room.with all DMEs ordered. Will follow up.

## 2023-06-28 NOTE — Progress Notes (Signed)
Physical Therapy Treatment Patient Details Name: Melanie Hancock MRN: [id number] DOB: 1995-08-25 Today's Date: 06/28/2023   History of Present Illness The pt is a 27 yo female presenting 11/7 for L5-S1 decompression with bilateral foraminotomies. PMH includes: anemia, anxiety, bipolar disorder, depression, and UTI.    PT Comments  The pt was seen for additional mobility session to address her concerns regarding d/c home. Pt reports she is worried about her incision hurting, reinforced that post-operative pain is normal/expected and pain management strategies (movement, ice, changing positions, sleeping positions/use of pillows). The pt was able to complete sit-stand transfers without DME or assist, remains slow to rise to standing but without instability or buckling. She progressed to ~250 ft hallway ambulation, good stability, but slowed speed. Pt used RW this afternoon for her comfort (ambulated without it this morning). The pt then called her mother and both asked questions about mobility/positioning recommendations, and HEP the pt can start when she returns home. Spinal precautions reiterated and both verbalized understanding. Pt is safe for d/c home with family support.     If plan is discharge home, recommend the following: A little help with walking and/or transfers;A little help with bathing/dressing/bathroom;Help with stairs or ramp for entrance;Assistance with cooking/housework;Assist for transportation   Can travel by private vehicle        Equipment Recommendations  Rolling walker (2 wheels);BSC/3in1    Recommendations for Other Services       Precautions / Restrictions Precautions Precautions: Back;Fall Precaution Booklet Issued: Yes (comment) Required Braces or Orthoses: Spinal Brace Spinal Brace: Lumbar corset;Applied in sitting position Restrictions Weight Bearing Restrictions: No     Mobility  Bed Mobility Overal bed mobility: Needs Assistance Bed Mobility: Rolling,  Sidelying to Sit Rolling: Supervision Sidelying to sit: Supervision, Used rails       General bed mobility comments: cues for log roll    Transfers Overall transfer level: Needs assistance Equipment used: Rolling walker (2 wheels), None Transfers: Sit to/from Stand Sit to Stand: Contact guard assist           General transfer comment: CGA to supervision, pt dependent on UE to push up to standing from EOB and low toilet. reports increased pain in her back with standing and needing cues to use her legs to stand. pt states no issues with strength or pain in legs yet not using them to stand    Ambulation/Gait Ambulation/Gait assistance: Contact guard assist, Supervision Gait Distance (Feet): 250 Feet Assistive device: Rolling walker (2 wheels) Gait Pattern/deviations: Step-through pattern, Decreased stride length Gait velocity: decreased Gait velocity interpretation: <1.31 ft/sec, indicative of household ambulator   General Gait Details: pt with slow but steady gait with small steps. no instability or toe drag noted.   Stairs Stairs: Yes Stairs assistance: Contact guard assist Stair Management: Two rails, Step to pattern, Forwards Number of Stairs: 10 General stair comments: RLE leading ascending, LLE leading descending. more pain with LLE on stairs today   Wheelchair Mobility     Tilt Bed    Modified Rankin (Stroke Patients Only)       Balance Overall balance assessment: Needs assistance Sitting-balance support: No upper extremity supported, Feet supported Sitting balance-Leahy Scale: Good     Standing balance support: No upper extremity supported, During functional activity Standing balance-Leahy Scale: Fair Standing balance comment: poor tolerance for challenge  Cognition Arousal: Alert Behavior During Therapy: WFL for tasks assessed/performed Overall Cognitive Status: Within Functional Limits for tasks assessed                                  General Comments: needing emotional support and assist to problem solve for management at home        Exercises General Exercises - Lower Extremity Long Arc Quad: AROM, Both, 10 reps, Seated Hip Flexion/Marching: AROM, Both, 10 reps, Seated Heel Raises: AROM, Both, Seated, 15 reps    General Comments General comments (skin integrity, edema, etc.): VSS on RA, discussed recommendations and exercises with the pt and her mother. all verbalized understanding      Pertinent Vitals/Pain Pain Assessment Pain Assessment: Faces Faces Pain Scale: Hurts little more Pain Location: incision, low back Pain Descriptors / Indicators: Discomfort, Cramping, Grimacing, Guarding Pain Intervention(s): Limited activity within patient's tolerance, Monitored during session, Repositioned    Home Living Family/patient expects to be discharged to:: Private residence Living Arrangements: Children Available Help at Discharge: Family;Available PRN/intermittently (dad can assist but doesn't drive, mom staying for a few days from out of town. pt reports other support is "unreliable") Type of Home: Apartment Home Access: Stairs to enter Entrance Stairs-Rails: Right;Left;Can reach both Entrance Stairs-Number of Steps: 16   Home Layout: One level Home Equipment: None Additional Comments: pt has sink next to toilet on L side she can push on    Prior Function            PT Goals (current goals can now be found in the care plan section) Acute Rehab PT Goals Patient Stated Goal: return to independence, reduce pain PT Goal Formulation: With patient Time For Goal Achievement: 07/12/23 Potential to Achieve Goals: Good Progress towards PT goals: Progressing toward goals    Frequency    Min 5X/week           AM-PAC PT "6 Clicks" Mobility   Outcome Measure  Help needed turning from your back to your side while in a flat bed without using bedrails?: A  Little Help needed moving from lying on your back to sitting on the side of a flat bed without using bedrails?: A Little Help needed moving to and from a bed to a chair (including a wheelchair)?: A Little Help needed standing up from a chair using your arms (e.g., wheelchair or bedside chair)?: A Little Help needed to walk in hospital room?: A Little Help needed climbing 3-5 steps with a railing? : A Little 6 Click Score: 18    End of Session Equipment Utilized During Treatment: Gait belt;Back brace Activity Tolerance: Patient tolerated treatment well Patient left: in bed;with call bell/phone within reach (sitting EOB) Nurse Communication: Mobility status PT Visit Diagnosis: Unsteadiness on feet (R26.81);Other abnormalities of gait and mobility (R26.89);Muscle weakness (generalized) (M62.81);Pain Pain - Right/Left: Left Pain - part of body:  (back and hip)     Time: 1220-1310 PT Time Calculation (min) (ACUTE ONLY): 50 min  Charges:    $Gait Training: 8-22 mins $Therapeutic Exercise: 8-22 mins $Therapeutic Activity: 8-22 mins PT General Charges $$ ACUTE PT VISIT: 1 Visit                     Vickki Muff, PT, DPT   Acute Rehabilitation Department Office [phone] Secure Chat Communication Preferred   Ronnie Derby 06/28/2023, 2:35 PM

## 2023-06-28 NOTE — Evaluation (Signed)
Occupational Therapy Evaluation and Discharge Patient Details Name: MARLES SWINTON MRN: [id number] DOB: 03-04-1996 Today's Date: 06/28/2023   History of Present Illness The pt is a 27 yo female presenting 11/7 for L5-S1 decompression with bilateral foraminotomies. PMH includes: anemia, anxiety, bipolar disorder, depression, and UTI.   Clinical Impression   This 27 yo female admitted and underwent above presents to acute OT with all education completed, post op back surgery education handout given, and reacher-sockaid-long handled sponge issued to patient. No further OT needs, we will sign off.       If plan is discharge home, recommend the following: Assistance with cooking/housework;Assist for transportation    Functional Status Assessment  Patient has had a recent decline in their functional status and demonstrates the ability to make significant improvements in function in a reasonable and predictable amount of time. (no further skilled OT needs, all education completed and post op back surgery handout given)  Equipment Recommendations  Tub/shower bench;BSC/3in1       Precautions / Restrictions Precautions Precautions: Back;Fall Precaution Booklet Issued: Yes (comment) Required Braces or Orthoses: Spinal Brace Spinal Brace: Lumbar corset;Applied in sitting position Restrictions Weight Bearing Restrictions: No      Mobility Bed Mobility Overal bed mobility: Modified Independent                  Transfers Overall transfer level: Modified independent   Transfers: Sit to/from Stand Sit to Stand: Modified independent (Device/Increase time)                  Balance Overall balance assessment: Mild deficits observed, not formally tested                                         ADL either performed or assessed with clinical judgement   ADL Overall ADL's : Needs assistance/impaired;Modified independent Eating/Feeding: Independent;Sitting    Grooming: Modified independent;Standing Grooming Details (indicate cue type and reason): Educated on using 2 cups to brush her teeth to avoid bending over the sink (one to rinse and one to spit) Upper Body Bathing: Independent;Sitting   Lower Body Bathing: Modified independent;With adaptive equipment;Sit to/from stand Lower Body Bathing Details (indicate cue type and reason): Issued pt a long handled sponge Upper Body Dressing : Independent;Sitting   Lower Body Dressing: Modified independent;With adaptive equipment;Sit to/from stand Lower Body Dressing Details (indicate cue type and reason): educated on use of reacher for donning underwear and pants and doffing socks; sock aid to donn socks--pt returned demo for socks and pants. Issued pt a Scientist, physiological: Modified Independent;Comfort height toilet;Grab bars   Toileting- Clothing Manipulation and Hygiene: Modified independent;Sit to/from stand Toileting - Clothing Manipulation Details (indicate cue type and reason): Educated on use of wet wipes for back peri care to avoid as much twisting   Tub/Shower Transfer Details (indicate cue type and reason): educated on how to position tub bench and how to manipulate shower curtain so water does not get out.   General ADL Comments: educated on the sequence of dressing that is better for the back. Back brace adjusted to be even on both sides     Vision Patient Visual Report: No change from baseline              Pertinent Vitals/Pain Pain Assessment Pain Assessment: Faces Faces Pain Scale: Hurts little more Pain Location: incision Pain Descriptors /  Indicators: Aching, Sore, Grimacing, Guarding Pain Intervention(s): Limited activity within patient's tolerance, Monitored during session     Extremity/Trunk Assessment Upper Extremity Assessment Upper Extremity Assessment: Overall WFL for tasks assessed       Communication Communication Communication: No apparent  difficulties    Cognition Arousal: Alert Behavior During Therapy: WFL for tasks assessed/performed Overall Cognitive Status: Within Functional Limits for tasks assessed                                                  Home Living Family/patient expects to be discharged to:: Private residence Living Arrangements: Children Available Help at Discharge: Family;Available PRN/intermittently (dad can assist but doesn't drive, mom staying for a few days from out of town. pt reports other support is "unreliable") Type of Home: Apartment Home Access: Stairs to enter Entrance Stairs-Number of Steps: 16 Entrance Stairs-Rails: Right;Left;Can reach both Home Layout: One level     Bathroom Shower/Tub: Chief Strategy Officer: Standard     Home Equipment: None   Additional Comments: pt has sink next to toilet on L side she can push on      Prior Functioning/Environment Prior Level of Function : Needs assist             Mobility Comments: pt walking without DME, limited by back pain. 27yo helped push laundry basket around to manage laundry. ADLs Comments: pt reports independent but limited by pain        OT Problem List: Decreased range of motion;Impaired balance (sitting and/or standing);Pain         OT Goals(Current goals can be found in the care plan section) Acute Rehab OT Goals Patient Stated Goal: to go home tomorrow         AM-PAC OT "6 Clicks" Daily Activity     Outcome Measure Help from another person eating meals?: None Help from another person taking care of personal grooming?: None Help from another person toileting, which includes using toliet, bedpan, or urinal?: None Help from another person bathing (including washing, rinsing, drying)?: None Help from another person to put on and taking off regular upper body clothing?: None Help from another person to put on and taking off regular lower body clothing?: None 6 Click Score: 24    End of Session Equipment Utilized During Treatment: Rolling walker (2 wheels);Back brace Nurse Communication:  (pt ready to go from OT standpoint, needs a tub bench and a 3n1)  Activity Tolerance: Patient tolerated treatment well Patient left:  (up in bathroom)  OT Visit Diagnosis: Muscle weakness (generalized) (M62.81);Pain Pain - part of body:  (incisional)                Time: 8295-6213 OT Time Calculation (min): 34 min Charges:  OT General Charges $OT Visit: 1 Visit OT Evaluation $OT Eval Moderate Complexity: 1 Mod OT Treatments $Self Care/Home Management : 8-22 mins  Lindon Romp OT Acute Rehabilitation Services Office [phone]    Evette Georges 06/28/2023, 11:51 AM

## 2023-06-28 NOTE — TOC CM/SW Note (Signed)
Pt will follow up with MD prior to having recommended outpt therapy arranged.  Pt has transport home.

## 2023-06-29 DIAGNOSIS — M5117 Intervertebral disc disorders with radiculopathy, lumbosacral region: Secondary | ICD-10-CM | POA: Diagnosis not present

## 2023-06-29 NOTE — Progress Notes (Signed)
Physical Therapy Treatment Patient Details Name: Melanie Hancock MRN: [id number] DOB: 30-Apr-1996 Today's Date: 06/29/2023   History of Present Illness The pt is a 27 yo female presenting 11/7 for L5-S1 decompression with bilateral foraminotomies. PMH includes: anemia, anxiety, bipolar disorder, depression, and UTI.    PT Comments  Pt's BP remained stable this session, allowing her to progress to navigating stairs. She requires increased time and benefits from UE support for pain management and stability when ambulating and navigating stairs. She required CGA only for safety. Educated pt and practiced tub transfer while utilizing a tub bench this date as well. She experienced increased back pain when trying to pivot, scoot, and lift her legs over the side of the tub. She needed increased time and cues to complete as well, but only CGA was needed for safety. She did need minA 1x for balance when transferring to stand from a commode while trying to perform pericare. Educated pt on car transfers, maintaining her spinal precautions with household chores, and frequent repositioning/mobilization. Will continue to follow acutely.  BP- 121/56 (74) sitting start of session 124/62 (80) standing after using bathroom 116/63 (77) standing after all mobility before returning to bed end of session  *pt reported some "stars" and "waves" after completing the tub transfer, which resulted in pt experiencing more pain   If plan is discharge home, recommend the following: A little help with walking and/or transfers;A little help with bathing/dressing/bathroom;Help with stairs or ramp for entrance;Assistance with cooking/housework;Assist for transportation   Can travel by private vehicle        Equipment Recommendations  Rolling walker (2 wheels);BSC/3in1    Recommendations for Other Services       Precautions / Restrictions Precautions Precautions: Back;Fall Precaution Booklet Issued: Yes  (comment) Precaution Comments: reviewed spinal precautions Required Braces or Orthoses: Spinal Brace Spinal Brace: Lumbar corset;Applied in sitting position Restrictions Weight Bearing Restrictions: No     Mobility  Bed Mobility Overal bed mobility: Needs Assistance Bed Mobility: Sit to Sidelying         Sit to sidelying: Supervision General bed mobility comments: Extra time to transition sit to sidelying due to pain but able to complete with supervision for safety, HOB flat    Transfers Overall transfer level: Needs assistance Equipment used: Rolling walker (2 wheels), None Transfers: Sit to/from Stand Sit to Stand: Contact guard assist, Min assist           General transfer comment: Pt needs extra time to power up to stand intermittently depending on the level of her back pain. CGA majority of time for safety but minA 1x to stand and maintain balance from commode while pt attempted to perform pericare. Pt needed extra time, verbal cues, and demonstration on how to perform a transfer to/from tub bench. She needed extra time and cues for hand placement to pivot her body and scoot herself laterally on bench and swing legs over side of tub to practice transfer. Swinging legs over side of tub caused pt a lot of back pain but she was able to complete with CGA for safety. Educated pt that this would be similar technique for car transfers.    Ambulation/Gait Ambulation/Gait assistance: Contact guard assist Gait Distance (Feet): 100 Feet (x3 bouts of ~20 ft > ~100 ft > ~100 ft) Assistive device: Rolling walker (2 wheels), None Gait Pattern/deviations: Step-through pattern, Decreased stride length, Narrow base of support Gait velocity: decreased Gait velocity interpretation: <1.31 ft/sec, indicative of household ambulator   General  Gait Details: Pt demonstrated carryover of cues to widen BOS, look up when ambulating, and perform heel-to-toe sequencing when stepping. Slow and cautious  with no LOB, CGA for safety. Pt able to take some steps without UE support without LOB also, RW was helpful for pain management   Stairs Stairs: Yes Stairs assistance: Contact guard assist Stair Management: Two rails, Step to pattern, Forwards Number of Stairs: 8 General stair comments: Pt ascends with her R leg leading and descends with her L leg leading due to L leg being more painful. Slow but steady, no LOB, CGA for safety. Pt likely could have done more but decided to then go focus on tub transfer.   Wheelchair Mobility     Tilt Bed    Modified Rankin (Stroke Patients Only)       Balance Overall balance assessment: Needs assistance Sitting-balance support: No upper extremity supported, Feet supported Sitting balance-Leahy Scale: Good     Standing balance support: No upper extremity supported, During functional activity, Bilateral upper extremity supported Standing balance-Leahy Scale: Fair Standing balance comment: poor tolerance for challenge, likes RW for pain management                            Cognition Arousal: Alert Behavior During Therapy: WFL for tasks assessed/performed, Anxious Overall Cognitive Status: Within Functional Limits for tasks assessed                                          Exercises      General Comments General comments (skin integrity, edema, etc.): BP 121/56 (74) sitting start of session, 124/62 (80) standing after using bathroom, 116/63 (77) standing after all mobility before returning to bed end of session; pt reported some "stars" and "waves" after completing the tub transfer and experiencing more pain; educated pt on signs/symptoms of infection and when to come in if needed after d/c; educated pt on maintaining her spinal precautions with household chores      Pertinent Vitals/Pain Pain Assessment Pain Assessment: Faces Faces Pain Scale: Hurts even more Pain Location: incision, low back (L>R); muscle  spasm into R foot end of session Pain Descriptors / Indicators: Discomfort, Grimacing, Guarding, Pressure, Spasm, Burning Pain Intervention(s): Limited activity within patient's tolerance, Monitored during session, Repositioned, Patient requesting pain meds-RN notified    Home Living                          Prior Function            PT Goals (current goals can now be found in the care plan section) Acute Rehab PT Goals Patient Stated Goal: return to independence, reduce pain PT Goal Formulation: With patient Time For Goal Achievement: 07/12/23 Potential to Achieve Goals: Good Progress towards PT goals: Progressing toward goals    Frequency    Min 5X/week      PT Plan      Co-evaluation              AM-PAC PT "6 Clicks" Mobility   Outcome Measure  Help needed turning from your back to your side while in a flat bed without using bedrails?: A Little Help needed moving from lying on your back to sitting on the side of a flat bed without using bedrails?: A Little Help needed moving to  and from a bed to a chair (including a wheelchair)?: A Little Help needed standing up from a chair using your arms (e.g., wheelchair or bedside chair)?: A Little Help needed to walk in hospital room?: A Little Help needed climbing 3-5 steps with a railing? : A Little 6 Click Score: 18    End of Session Equipment Utilized During Treatment: Gait belt;Back brace Activity Tolerance: Patient tolerated treatment well Patient left: with call bell/phone within reach;in bed Nurse Communication: Patient requests pain meds PT Visit Diagnosis: Unsteadiness on feet (R26.81);Other abnormalities of gait and mobility (R26.89);Muscle weakness (generalized) (M62.81);Pain Pain - Right/Left: Left Pain - part of body:  (back and hip)     Time: 5409-8119 PT Time Calculation (min) (ACUTE ONLY): 56 min  Charges:    $Gait Training: 23-37 mins $Therapeutic Activity: 23-37 mins PT General  Charges $$ ACUTE PT VISIT: 1 Visit                     Virgil Benedict, PT, DPT Acute Rehabilitation Services  Office: [phone]    Bettina Gavia 06/29/2023, 4:37 PM

## 2023-06-29 NOTE — Progress Notes (Signed)
Pt with c/o a feeling of "passing out" when assisted to bathroom by NT. Pt reported feeling after having a BM. Vitals taken and within normal limits. Assisted pt back to bed and made comfortable.

## 2023-06-29 NOTE — Progress Notes (Addendum)
Pt c/o hemorrhoid flare up after her BM today. On call notified. Verbal order for preparation H received from on call provider Clint Bolder, PA.

## 2023-06-29 NOTE — Progress Notes (Addendum)
Pt stated her robaxin and ambien dropped on the floor. Meds discarded. Requested new doses. New meds pulled and given to patient

## 2023-06-29 NOTE — Progress Notes (Signed)
    Subjective: Seen in rounds for Dr. Shon Baton.  Patient reports pain as mild to moderate.  Denies N/V/CP/SOB/Abd pain. Reports incisional pain.  Patient was supposed to d/c yesterday but felt she was not ready yesterday.  She reports some improvement with ambulation.  No BM but positive flatus.   Objective:   VITALS:   Vitals:   06/28/23 1827 06/28/23 2127 06/29/23 0603 06/29/23 0700  BP: 112/63 115/66 115/63 118/64  Pulse: 63 66 70   Resp: 17 18 18 18   Temp: 98.2 F (36.8 C) 98 F (36.7 C) 98.4 F (36.9 C)   TempSrc: Oral Oral Oral   SpO2: 100% 100% 98% 100%  Weight:      Height:        NAD Incision C/D/I.  Some periincisional pain.  Nv intact distally in LE bilaterally.  Neurologically intact ABD soft Neurovascular intact Sensation intact distally Intact pulses distally Dorsiflexion/Plantar flexion intact   Lab Results  Component Value Date   WBC 3.5 (L) 06/19/2023   HGB 10.2 (L) 06/19/2023   HCT 33.7 (L) 06/19/2023   MCV 79.5 (L) 06/19/2023   PLT 383 06/19/2023   BMET    Component Value Date/Time   NA 139 06/19/2023 1130   NA 142 09/23/2018 1753   NA 136 02/01/2016 1453   K 4.6 06/19/2023 1130   K 3.4 (L) 02/01/2016 1453   CL 108 06/19/2023 1130   CO2 28 06/19/2023 1130   CO2 24 02/01/2016 1453   GLUCOSE 96 06/19/2023 1130   GLUCOSE 80 02/01/2016 1453   BUN 11 06/19/2023 1130   BUN 9 09/23/2018 1753   BUN 5.3 (L) 02/01/2016 1453   CREATININE 0.72 06/19/2023 1130   CREATININE 0.7 02/01/2016 1453   CALCIUM 9.2 06/19/2023 1130   CALCIUM 9.0 02/01/2016 1453   EGFR >90 02/01/2016 1453   GFRNONAA >60 06/19/2023 1130     Assessment/Plan: 2 Days Post-Op   Principal Problem:   Lumbar disc herniation   WBAT Dispo: D/c home today pending pain control.    Clois Dupes 06/29/2023, 10:46 AM   [facility]  [place] [address], [address], [place], [place] [address] Phone: [phone] [email or web address] Facebook   [employer]

## 2023-06-29 NOTE — TOC Transition Note (Signed)
Transition of Care [facility]) - CM/SW Discharge Note   Patient Details  Name: Melanie Hancock MRN: [id number] Date of Birth: [birth year]  [facility]) CM/SW Contact:  Ronny Bacon, RN Phone Number: 06/29/2023, 11:00 AM   Clinical Narrative:  Discharge orders noted. Outpatient physical Therapy recommendations noted, referral # [id number] placed for outpatient PT at [facility]. Contact information placed on AVS.     Final next level of care: Home/Self Care Barriers to Discharge: No Barriers Identified   Patient Goals and CMS Choice      Discharge Placement                         Discharge Plan and Services Additional resources added to the After Visit Summary for                                       Social Determinants of Health (SDOH) Interventions SDOH Screenings   Food Insecurity: No Food Insecurity (06/28/2023)  Housing: Low Risk  (06/28/2023)  Transportation Needs: No Transportation Needs (06/28/2023)  Utilities: Not At Risk (06/28/2023)  Social Connections: Unknown (08/07/2022)   Received from [facility]  Tobacco Use: High Risk (06/27/2023)     Readmission Risk Interventions     No data to display

## 2023-06-29 NOTE — Progress Notes (Signed)
Pt c/o severe pain and unable to complete stairs exercise due to uncontrolled pain. Reports provider had mentioned it being okay to stay another night if she (patient) did not feel well. Clint Bolder, [place] made aware. Order given to discontinue discharge order.

## 2023-06-29 NOTE — Progress Notes (Signed)
Physical Therapy Treatment Patient Details Name: ARLYNNE MASTRIANO MRN: [id number] DOB: 1996-07-15 Today's Date: 06/29/2023   History of Present Illness The pt is a 27 yo female presenting 11/7 for L5-S1 decompression with bilateral foraminotomies. PMH includes: anemia, anxiety, bipolar disorder, depression, and UTI.    PT Comments  Pt received seated EOB, pt agreeable to therapy session after toileting, pt reports continued feeling of constipation and feeling dehydrated, pt agreeable to therapy session to work on transfer and gait training. Pt with mild symptomatic orthostatic hypotension and c/o blurry vision/waves of dizziness during hallway gait trial so defer stair trial in AM due to symptoms and RN notified. Plan to work on stair ascent/descent later in day after pt has lunch/drinks more water. Pt continues to benefit from PT services to progress toward functional mobility goals.     06/29/23 1300  Vital Signs  Patient Position (if appropriate) Orthostatic Vitals  Orthostatic Sitting  BP- Sitting 119/66  Pulse- Sitting 59  Orthostatic Standing at 0 minutes  BP- Standing at 0 minutes 102/60  Pulse- Standing at 0 minutes 72  Orthostatic Standing at 3 minutes  BP- Standing at 3 minutes 99/65  Pulse- Standing at 3 minutes 74  Oxygen Therapy  SpO2 100 %  O2 Device Room Air     If plan is discharge home, recommend the following: A little help with walking and/or transfers;A little help with bathing/dressing/bathroom;Help with stairs or ramp for entrance;Assistance with cooking/housework;Assist for transportation   Can travel by private vehicle        Equipment Recommendations  Rolling walker (2 wheels);BSC/3in1    Recommendations for Other Services       Precautions / Restrictions Precautions Precautions: Back;Fall Precaution Booklet Issued: Yes (comment) Required Braces or Orthoses: Spinal Brace Spinal Brace: Lumbar corset;Applied in sitting position Restrictions Weight  Bearing Restrictions: No     Mobility  Bed Mobility Overal bed mobility: Needs Assistance             General bed mobility comments: pt received seated EOB in care of nursing staff    Transfers Overall transfer level: Needs assistance Equipment used: Rolling walker (2 wheels), None Transfers: Sit to/from Stand Sit to Stand: Contact guard assist           General transfer comment: CGA to supervision, pt dependent on UE to push up to standing from EOB, cues not to push through RW with both hands    Ambulation/Gait Ambulation/Gait assistance: Contact guard assist Gait Distance (Feet): 82 Feet Assistive device: Rolling walker (2 wheels) Gait Pattern/deviations: Step-through pattern, Decreased stride length, Narrow base of support Gait velocity: decreased     General Gait Details: Cues for improved heel strike needed and for wider BOS, pt tends to very ponderous pace, can improve slightly with cues; pt c/o lightheadedness/blurry vision after ~24ft so returned to room for pt safety. Pt mildly orthostatic with standing   Stairs         General stair comments: defer, pt c/o lightheadedness/blurry vision   Wheelchair Mobility     Tilt Bed    Modified Rankin (Stroke Patients Only)       Balance Overall balance assessment: Needs assistance Sitting-balance support: No upper extremity supported, Feet supported Sitting balance-Leahy Scale: Good     Standing balance support: No upper extremity supported, During functional activity Standing balance-Leahy Scale: Fair Standing balance comment: poor tolerance for challenge  Cognition Arousal: Alert Behavior During Therapy: WFL for tasks assessed/performed, Anxious Overall Cognitive Status: Within Functional Limits for tasks assessed                                          Exercises      General Comments General comments (skin integrity, edema, etc.):  see BP in comments above      Pertinent Vitals/Pain Pain Assessment Pain Assessment: Faces Faces Pain Scale: Hurts little more Pain Location: incision, low back Pain Descriptors / Indicators: Discomfort, Grimacing, Guarding, Pressure, Other (Comment), Tingling ("cold" feeling to L of dressing on back; per pt her sensation is starting to come back) Pain Intervention(s): Limited activity within patient's tolerance, Monitored during session, Repositioned    Home Living                          Prior Function            PT Goals (current goals can now be found in the care plan section) Acute Rehab PT Goals Patient Stated Goal: return to independence, reduce pain PT Goal Formulation: With patient Time For Goal Achievement: 07/12/23 Progress towards PT goals: Progressing toward goals    Frequency    Min 5X/week      PT Plan      Co-evaluation              AM-PAC PT "6 Clicks" Mobility   Outcome Measure  Help needed turning from your back to your side while in a flat bed without using bedrails?: A Little Help needed moving from lying on your back to sitting on the side of a flat bed without using bedrails?: A Little Help needed moving to and from a bed to a chair (including a wheelchair)?: A Little Help needed standing up from a chair using your arms (e.g., wheelchair or bedside chair)?: A Little Help needed to walk in hospital room?: A Little Help needed climbing 3-5 steps with a railing? : A Lot 6 Click Score: 17    End of Session Equipment Utilized During Treatment: Gait belt;Back brace Activity Tolerance: Patient tolerated treatment well;Treatment limited secondary to medical complications (Comment);Other (comment) (symptomatic mild orthostatic hypotension) Patient left: with call bell/phone within reach;in chair;with family/visitor present (sig other in room) Nurse Communication: Mobility status;Other (comment) (soft BP standing, blurry  vision/lightheaded, feeling dehydrated) PT Visit Diagnosis: Unsteadiness on feet (R26.81);Other abnormalities of gait and mobility (R26.89);Muscle weakness (generalized) (M62.81);Pain Pain - Right/Left: Left Pain - part of body:  (back and hip)     Time: 1610-9604 PT Time Calculation (min) (ACUTE ONLY): 33 min  Charges:    $Gait Training: 8-22 mins $Therapeutic Activity: 8-22 mins PT General Charges $$ ACUTE PT VISIT: 1 Visit                     Kaianna Dolezal P., PTA Acute Rehabilitation Services Secure Chat Preferred 9a-5:30pm Office: [phone]    Dorathy Kinsman [facility] 06/29/2023, 1:13 PM

## 2023-06-30 ENCOUNTER — Ambulatory Visit (HOSPITAL_COMMUNITY): Payer: Medicaid Other

## 2023-06-30 DIAGNOSIS — M5117 Intervertebral disc disorders with radiculopathy, lumbosacral region: Secondary | ICD-10-CM | POA: Diagnosis not present

## 2023-06-30 MED ORDER — ALBUTEROL SULFATE (2.5 MG/3ML) 0.083% IN NEBU
2.5000 mg | INHALATION_SOLUTION | RESPIRATORY_TRACT | Status: DC | PRN
  Administered 2023-06-30: 2.5 mg via RESPIRATORY_TRACT
  Filled 2023-06-30: qty 3

## 2023-06-30 NOTE — Progress Notes (Signed)
Pt reports that she didn't make it to the stairs this afternoon with PT. She reports that she got light headed and dizzy and had to return to her room and rest.  She has still felt tight in the chest all day.  She hasn't had an albuterol treatment yet.    PE:  wn wd woman sitting on her bed talking on the phone.  She appears to be comfortable.  No SOB.  I asked her nurse to call RT for a nebulizer treatment.  We'll see how she responds and call a hospitalist consult if no improvement.

## 2023-06-30 NOTE — Progress Notes (Signed)
Physical Therapy Treatment Patient Details Name: Melanie Hancock MRN: [id number] DOB: [birth year] Today's Date: 06/30/2023   History of Present Illness The pt is a 27 yo female presenting 11/7 for L5-S1 decompression with bilateral foraminotomies. PMH includes: anemia, anxiety, bipolar disorder, depression, and UTI.    PT Comments  Continuing work on functional mobility and activity tolerance;  Pt reports feeling overall better; Planned on stair training today, however, pt with 2 bouts of feeling dizzy, hot, and seeing stars while walking, and ultimately pt did not think she could perform the stairs safely;  Pt strikes me as anxious; took time while sitting and recovering from the syncopal symptoms to talk about her concerns, and pt told this PT a little more about her injury and recovery path; Became tearful a few times during conversation; Provided listening and encouragement; Anticipate she will be able to get home tomrorrow; Recommend rolling to stairwell (in recliner or transport chair), and walking back to her tolerance;   BP sitting 112/75, HR 60 BP standing 113/84 HR 68 BP seated, pt looking anxious  121/103 HR 119 BP seated, just before heading back to the room; 116/77 HR 54    If plan is discharge home, recommend the following: A little help with walking and/or transfers;A little help with bathing/dressing/bathroom;Help with stairs or ramp for entrance;Assistance with cooking/housework;Assist for transportation   Can travel by private vehicle        Equipment Recommendations  Rolling walker (2 wheels);BSC/3in1 (tub bench)    Recommendations for Other Services       Precautions / Restrictions Precautions Precautions: Back;Fall Precaution Booklet Issued: Yes (comment) Precaution Comments: reviewed spinal precautions Required Braces or Orthoses: Spinal Brace Spinal Brace: Lumbar corset;Applied in sitting position     Mobility  Bed Mobility Overal bed mobility: Needs  Assistance Bed Mobility: Rolling, Supine to Sit Rolling: Supervision Sidelying to sit: Supervision, Used rails       General bed mobility comments: Extra time; no physical assist needed    Transfers Overall transfer level: Needs assistance Equipment used: Rolling walker (2 wheels) Transfers: Sit to/from Stand Sit to Stand: Contact guard assist           General transfer comment: stood from bed, toilet, and chair with and without armrests    Ambulation/Gait Ambulation/Gait assistance: Contact guard assist Gait Distance (Feet): 300 Feet (2 seated rest breaks) Assistive device: Rolling walker (2 wheels) Gait Pattern/deviations: Step-through pattern       General Gait Details: Pt demonstrated carryover of cues to widen BOS, look up when ambulating, and perform heel-to-toe sequencing when stepping. Slow and cautious with no LOB, CGA for safety. Reported 2 bouts of feeling hot, dizzy, and seeing spots/stars   Stairs         General stair comments: Discussed stair management in terms of activity tolerance; ultimately, pt felt too symptomatic with dizzy, feeling hot, and seeing spots to get to stairs this session   Wheelchair Mobility     Tilt Bed    Modified Rankin (Stroke Patients Only)       Balance     Sitting balance-Leahy Scale: Good       Standing balance-Leahy Scale: Fair                              Cognition Arousal: Alert Behavior During Therapy: WFL for tasks assessed/performed, Anxious Overall Cognitive Status: Within Functional Limits for tasks assessed  General Comments: needing emotional support and assist to problem solve for management at home        Exercises      General Comments General comments (skin integrity, edema, etc.): Tood serial BPs (see PT comments)      Pertinent Vitals/Pain Pain Assessment Pain Assessment: Faces Faces Pain Scale: Hurts little more Pain  Location: incision, low back (L>R Pain Descriptors / Indicators: Discomfort, Grimacing, Guarding, Pressure, Spasm, Burning Pain Intervention(s): Limited activity within patient's tolerance    Home Living                          Prior Function            PT Goals (current goals can now be found in the care plan section) Acute Rehab PT Goals Patient Stated Goal: return to independence, reduce pain PT Goal Formulation: With patient Time For Goal Achievement: 07/12/23 Potential to Achieve Goals: Good Progress towards PT goals: Progressing toward goals    Frequency    Min 5X/week      PT Plan      Co-evaluation              AM-PAC PT "6 Clicks" Mobility   Outcome Measure  Help needed turning from your back to your side while in a flat bed without using bedrails?: None Help needed moving from lying on your back to sitting on the side of a flat bed without using bedrails?: None Help needed moving to and from a bed to a chair (including a wheelchair)?: A Little Help needed standing up from a chair using your arms (e.g., wheelchair or bedside chair)?: A Little Help needed to walk in hospital room?: A Little Help needed climbing 3-5 steps with a railing? : A Little 6 Click Score: 20    End of Session Equipment Utilized During Treatment: Back brace Activity Tolerance: Patient tolerated treatment well Patient left: in chair;with call bell/phone within reach;with family/visitor present Nurse Communication: Mobility status PT Visit Diagnosis: Unsteadiness on feet (R26.81);Other abnormalities of gait and mobility (R26.89);Muscle weakness (generalized) (M62.81);Pain Pain - Right/Left: Left Pain - part of body:  (back and hip)     Time: 9562-1308 PT Time Calculation (min) (ACUTE ONLY): 50 min  Charges:    $Gait Training: 23-37 mins $Therapeutic Activity: 8-22 mins PT General Charges $$ ACUTE PT VISIT: 1 Visit                     Van Clines, PT   Acute Rehabilitation Services Office [phone] Secure Chat welcomed    Melanie Hancock 06/30/2023, 6:55 PM

## 2023-06-30 NOTE — Plan of Care (Signed)
  Problem: Education: Goal: Knowledge of General Education information will improve Description: Including pain rating scale, medication(s)/side effects and non-pharmacologic comfort measures Outcome: Progressing   Problem: Health Behavior/Discharge Planning: Goal: Ability to manage health-related needs will improve Outcome: Progressing   Problem: Clinical Measurements: Goal: Ability to maintain clinical measurements within normal limits will improve Outcome: Progressing Goal: Will remain free from infection Outcome: Progressing Goal: Diagnostic test results will improve Outcome: Progressing Goal: Respiratory complications will improve Outcome: Progressing Goal: Cardiovascular complication will be avoided Outcome: Progressing   Problem: Activity: Goal: Risk for activity intolerance will decrease Outcome: Progressing   Problem: Nutrition: Goal: Adequate nutrition will be maintained Outcome: Progressing   Problem: Coping: Goal: Level of anxiety will decrease Outcome: Progressing   Problem: Elimination: Goal: Will not experience complications related to bowel motility Outcome: Progressing Goal: Will not experience complications related to urinary retention Outcome: Progressing   Problem: Pain Management: Goal: General experience of comfort will improve Outcome: Progressing   Problem: Safety: Goal: Ability to remain free from injury will improve Outcome: Progressing   Problem: Skin Integrity: Goal: Risk for impaired skin integrity will decrease Outcome: Progressing   Problem: Education: Goal: Ability to verbalize activity precautions or restrictions will improve Outcome: Progressing Goal: Knowledge of the prescribed therapeutic regimen will improve Outcome: Progressing Goal: Understanding of discharge needs will improve Outcome: Progressing   Problem: Activity: Goal: Ability to avoid complications of mobility impairment will improve Outcome: Progressing Goal:  Ability to tolerate increased activity will improve Outcome: Progressing Goal: Will remain free from falls Outcome: Progressing   Problem: Bowel/Gastric: Goal: Gastrointestinal status for postoperative course will improve Outcome: Progressing   Problem: Clinical Measurements: Goal: Ability to maintain clinical measurements within normal limits will improve Outcome: Progressing Goal: Postoperative complications will be avoided or minimized Outcome: Progressing Goal: Diagnostic test results will improve Outcome: Progressing   Problem: Pain Management: Goal: Pain level will decrease Outcome: Progressing   Problem: Skin Integrity: Goal: Will show signs of wound healing Outcome: Progressing

## 2023-06-30 NOTE — Plan of Care (Signed)
  Problem: Education: Goal: Knowledge of General Education information will improve Description Including pain rating scale, medication(s)/side effects and non-pharmacologic comfort measures Outcome: Progressing   

## 2023-06-30 NOTE — Progress Notes (Addendum)
Subjective: 3 Days Post-Op Procedure(s) (LRB): L5-S1 DECOMPRESSION WITH BILATERAL FORAMINOTOMIES (Bilateral) Patient reports pain as mild.  She was up with therapy yesterday and anticipates working on stairs today.  At home she has 16 steps to her residence after a long walk from the car.  She has tolerated a regular diet.  She has had a bowel movement twice yesterday.  She is urinating without difficulty.  She complains of a sense of tightness in her chest when using the incentive spirometer.  She has a history of asthma and uses an inhaler rarely.  She has been up to the bathroom and walking.  She has not been on blood thinners since surgery.  She denies any history of DVT or PE.  Objective:  Vital signs in last 24 hours: Temp:  [98.3 F (36.8 C)-99.3 F (37.4 C)] 98.6 F (37 C) (11/10 0755) Pulse Rate:  [50-79] 50 (11/10 0755) Resp:  [16-18] 18 (11/10 0755) BP: (100-115)/(49-63) 104/49 (11/10 0755) SpO2:  [100 %] 100 % (11/10 0755)  Intake/Output from previous day: No intake/output data recorded. Intake/Output this shift: No intake/output data recorded.  No results for input(s): "HGB" in the last 72 hours. No results for input(s): "WBC", "RBC", "HCT", "PLT" in the last 72 hours. No results for input(s): "NA", "K", "CL", "CO2", "BUN", "CREATININE", "GLUCOSE", "CALCIUM" in the last 72 hours. No results for input(s): "LABPT", "INR" in the last 72 hours.  Well-nourished well-developed well-appearing woman in no apparent distress.  Alert and oriented.  Respirations are unlabored.  Surgical dressing is clean and dry.  5 out of 5 strength in plantarflexion and dorsiflexion of the ankles and toes.  Intact sensibility to light touch throughout both feet.  No calf / ankle / foot swelling.  Neg Hohman bilat.   Assessment/Plan: 3 Days Post-Op Procedure(s) (LRB): L5-S1 DECOMPRESSION WITH BILATERAL FORAMINOTOMIES (Bilateral) Up with therapy weightbearing as tolerated.  I anticipate her staying  another night pending workup of her chest tightness.  Differential includes a flare of asthma as well as PE.  I have called for hospitalist consultation.  I will start her on Xarelto.  Chest x-ray is ordered.       Toni Arthurs 06/30/2023, 10:58 AM

## 2023-07-01 DIAGNOSIS — M5117 Intervertebral disc disorders with radiculopathy, lumbosacral region: Secondary | ICD-10-CM | POA: Diagnosis not present

## 2023-07-01 NOTE — Progress Notes (Signed)
    Subjective: Procedure(s) (LRB): L5-S1 DECOMPRESSION WITH BILATERAL FORAMINOTOMIES (Bilateral) 4 Days Post-Op  Patient reports pain as 1 on 0-10 scale.  Reports none leg pain reports incisional back pain   Positive void Positive bowel movement Positive flatus Negative chest pain or shortness of breath  Objective: Vital signs in last 24 hours: Temp:  [98.1 F (36.7 C)-98.9 F (37.2 C)] 98.1 F (36.7 C) (11/11 0557) Pulse Rate:  [54-68] 54 (11/11 0557) Resp:  [18-19] 18 (11/11 0557) BP: (99-120)/(47-60) 104/50 (11/11 0557) SpO2:  [100 %] 100 % (11/11 0557)  Intake/Output from previous day: No intake/output data recorded.  Labs: No results for input(s): "WBC", "RBC", "HCT", "PLT" in the last 72 hours. No results for input(s): "NA", "K", "CL", "CO2", "BUN", "CREATININE", "GLUCOSE", "CALCIUM" in the last 72 hours. No results for input(s): "LABPT", "INR" in the last 72 hours.  Physical Exam: Neurologically intact ABD soft Intact pulses distally Incision: dressing C/D/I and no drainage Compartment soft Body mass index is 29.12 kg/m.   Assessment/Plan: Patient stable  Continue mobilization with physical therapy Continue care  Patient describes sense of dizziness.  Reports symptoms consistent with anxiety.  Denies syncopal event.  Remains hemodynamically stable.  Normal urine output.  No tachycardia. Given her apprehension for discharge to home she would benefit for SNF.  A fall could result in recurrent HNP and the need for surgery.  Currently she is stable from my standpoint for home discharge.  Main issues is episodic dizziness which appears due to anxiety. Will arrange discharge to SNF.  Continue mobilization with assistance  Venita Lick, MD [facility] [phone]

## 2023-07-01 NOTE — Progress Notes (Addendum)
Physical Therapy Treatment Patient Details Name: Melanie Hancock MRN: [id number] DOB: 1996/05/30 Today's Date: 07/01/2023   History of Present Illness The pt is a 27 yo female presenting 11/7 for L5-S1 decompression with bilateral foraminotomies. PMH includes: anemia, anxiety, bipolar disorder, depression, and UTI.    PT Comments  Pt received seated EOB, pleasantly agreeable to therapy session, with good effort for transfer, gait and stair training. Pt needing up to CGA for safety while ascending full flight of stairs and progressed to Supervision at most for transfers and gait, nearly modI with RW for longer household distance gait trial. Chair follow for safety although no s/sx of orthostatic hypotension this date with standing mobility tasks and SpO2/HR [facility]. Pt asking about possibility of going outside "for fresh air" and pt noted to have vape device in her room. Pt up in chair at end of session to rest, RN notified, pt preparing to order breakfast as she reports she slept through AM meal. Pt continues to benefit from PT services to progress toward functional mobility goals. Continue to recommend OPPT, anticipate pt safe to DC home with initial increased supervision/assist from family/for childcare support.   If plan is discharge home, recommend the following: A little help with walking and/or transfers;A little help with bathing/dressing/bathroom;Help with stairs or ramp for entrance;Assistance with cooking/housework;Assist for transportation   Can travel by private vehicle        Equipment Recommendations  Rolling walker (2 wheels);BSC/3in1 (tub bench)    Recommendations for Other Services       Precautions / Restrictions Precautions Precautions: Back;Fall Precaution Booklet Issued: Yes (comment) Precaution Comments: reviewed spinal precautions Required Braces or Orthoses: Spinal Brace Spinal Brace: Lumbar corset;Applied in sitting position Restrictions Weight Bearing Restrictions:  No     Mobility  Bed Mobility Overal bed mobility: Needs Assistance             General bed mobility comments: pt received sitting EOB    Transfers Overall transfer level: Needs assistance Equipment used: Rolling walker (2 wheels) Transfers: Sit to/from Stand Sit to Stand: Supervision           General transfer comment: from EOB and recliner heights    Ambulation/Gait Ambulation/Gait assistance: Supervision Gait Distance (Feet): 240 Feet Assistive device: Rolling walker (2 wheels) Gait Pattern/deviations: Step-through pattern Gait velocity: decreased     General Gait Details: Supervision for safety, no symptoms of orthostatic hypotension but kept chair follow for safety as pt in previous sessions needed seated break in chair.   Stairs Stairs: Yes Stairs assistance: Contact guard assist Stair Management: Step to pattern, Forwards, One rail Right (HHA on LUE for safety and to simulate rail; rails in stairwell too wide) Number of Stairs: 12 (Flight of 12 steps + 2 in PT gym +2) General stair comments: Pt ascended/descended full flight of stairs with R rail, LUE HHA for safety, ascending on RLE which is less painful for her and descending on LLE. Once back to PT gym, she performed 2 steps x2 trials with BUE support and practicing ascending with LLE per her request but she states she feels pain with this more in her back, PTA reinforced importance of ascending with RLE for the time being to prevent worsening of pain at surgical site. HR/SpO2 [facility]   Wheelchair Mobility     Tilt Bed    Modified Rankin (Stroke Patients Only)       Balance Overall balance assessment: Needs assistance Sitting-balance support: No upper extremity supported, Feet supported Sitting  balance-Leahy Scale: Good     Standing balance support: No upper extremity supported Standing balance-Leahy Scale: Fair Standing balance comment: poor tolerance for challenge, likes RW for pain  management                            Cognition Arousal: Alert Behavior During Therapy: WFL for tasks assessed/performed, Anxious Overall Cognitive Status: Within Functional Limits for tasks assessed                                 General Comments: Pt calm, cooperative, reports symptoms have improved since working with this PTA on Saturday.        Exercises      General Comments General comments (skin integrity, edema, etc.): No acute s/sx distress standing today, HR 98 bpm during gait trial and SpO2 99% on RA.      Pertinent Vitals/Pain Pain Assessment Pain Assessment: Faces Faces Pain Scale: Hurts a little bit Pain Location: incision, low back (L>R) Pain Descriptors / Indicators: Discomfort, Burning, Guarding Pain Intervention(s): Monitored during session, Repositioned     PT Goals (current goals can now be found in the care plan section) Acute Rehab PT Goals Patient Stated Goal: return to independence, reduce pain PT Goal Formulation: With patient Time For Goal Achievement: 07/12/23 Progress towards PT goals: Progressing toward goals    Frequency    Min 5X/week      PT Plan         AM-PAC PT "6 Clicks" Mobility   Outcome Measure  Help needed turning from your back to your side while in a flat bed without using bedrails?: None Help needed moving from lying on your back to sitting on the side of a flat bed without using bedrails?: None Help needed moving to and from a bed to a chair (including a wheelchair)?: A Little Help needed standing up from a chair using your arms (e.g., wheelchair or bedside chair)?: A Little Help needed to walk in hospital room?: A Little Help needed climbing 3-5 steps with a railing? : A Little 6 Click Score: 20    End of Session Equipment Utilized During Treatment: Back brace;Gait belt (gait belt under her arm pits to avoid pressure over surgical incision) Activity Tolerance: Patient tolerated treatment  well Patient left: in chair;with call bell/phone within reach Nurse Communication: Mobility status PT Visit Diagnosis: Unsteadiness on feet (R26.81);Other abnormalities of gait and mobility (R26.89);Muscle weakness (generalized) (M62.81);Pain Pain - Right/Left: Left Pain - part of body:  (back and hip)     Time: 1324-4010 PT Time Calculation (min) (ACUTE ONLY): 22 min  Charges:    $Gait Training: 8-22 mins PT General Charges $$ ACUTE PT VISIT: 1 Visit                     Zyaire Mccleod P., PTA Acute Rehabilitation Services Secure Chat Preferred 9a-5:30pm Office: [phone]    Angus Palms 07/01/2023, 1:39 PM

## 2023-07-02 DIAGNOSIS — M5117 Intervertebral disc disorders with radiculopathy, lumbosacral region: Secondary | ICD-10-CM | POA: Diagnosis not present

## 2023-07-02 NOTE — Progress Notes (Signed)
Patient's bed is not in the lowest position. Patient was educated to have her bed in the lowest position. She states that she likes it up.

## 2023-07-02 NOTE — Progress Notes (Signed)
    Subjective: Procedure(s) (LRB): L5-S1 DECOMPRESSION WITH BILATERAL FORAMINOTOMIES (Bilateral) 5 Days Post-Op  Patient reports pain as 1 on 0-10 scale.  Reports none leg pain denies incisional back pain   Positive void Positive bowel movement Positive flatus Negative chest pain or shortness of breath  Objective: Vital signs in last 24 hours: Temp:  [98.2 F (36.8 C)-98.5 F (36.9 C)] 98.5 F (36.9 C) (11/12 0530) Pulse Rate:  [57-71] 57 (11/12 0530) Resp:  [17-18] 18 (11/12 0530) BP: (101-122)/(53-70) 101/53 (11/12 0530) SpO2:  [100 %] 100 % (11/11 1436)  Intake/Output from previous day: No intake/output data recorded.  Labs: No results for input(s): "WBC", "RBC", "HCT", "PLT" in the last 72 hours. No results for input(s): "NA", "K", "CL", "CO2", "BUN", "CREATININE", "GLUCOSE", "CALCIUM" in the last 72 hours. No results for input(s): "LABPT", "INR" in the last 72 hours.  Physical Exam: Neurologically intact ABD soft Intact pulses distally Incision: dressing C/D/I Compartment soft Body mass index is 29.12 kg/m.   Assessment/Plan: Patient stable  Continue mobilization with physical therapy Continue care  Patient remains hemodynamically intact.  No tachycardia/bradycardia. Patient reports a brief episode of dizziness yesterday but it resolved spontaneously.  Etiology of this is unknown.  Does have a history of anxiety and the lightheadedness/dizziness is a symptom she has had in the past. At this point in time she is expressed desire to go home.  We will continue to investigate the SNF discharge.  If she is able to go home today I think that is reasonable.  From the spine standpoint there is no active issues that would prohibit her from going home. If for some reason she decides that she does not want to go home then we will move forward with the SNF placement.  Venita Lick, MD [facility] [phone]

## 2023-07-02 NOTE — Progress Notes (Signed)
PT Cancellation Note  Patient Details Name: TALAYSHA RAYLE MRN: [id number] DOB: [birth year]   Cancelled Treatment:    Reason Eval/Treat Not Completed: (P) Other (comment) (Pt taking a bath in bathroom/not ready at time of attempt.) Will continue efforts later in day per PT plan of care as schedule permits, however anticipate pt OK to DC home from PTA perspective once medically cleared as she was able to perform transfers/gait unsupported and will have family assist with stairs to get into home. If time later in day, PTA will reattempt.   Dorathy Kinsman Ila Landowski 07/02/2023, 3:14 PM

## 2023-07-02 NOTE — Progress Notes (Signed)
DISCHARGE NOTE HOME Melanie Hancock to be discharged Home per MD order. Discussed prescriptions and follow up appointments with the patient. Prescriptions given to patient; medication list explained in detail. Patient verbalized understanding.  Skin clean, dry and intact without evidence of skin break down, no evidence of skin tears noted. IV catheter discontinued intact. Site without signs and symptoms of complications. Dressing and pressure applied. Pt denies pain at the site currently. No complaints noted.  Patient free of lines, drains, and wounds.   An After Visit Summary (AVS) was printed and given to the patient. Patient escorted via wheelchair, and discharged home via private auto.  Lorine Bears, RN

## 2023-07-02 NOTE — Progress Notes (Signed)
Awaiting new discharge order from Dr. Shon Baton.

## 2023-07-02 NOTE — Progress Notes (Signed)
Spoke with Dr. Dairl Ponder team and he will enter new discharge order once he is finished in the clinic. Primary nurse notified.

## 2023-07-02 NOTE — Progress Notes (Signed)
Physical Therapy Treatment Patient Details Name: Melanie Hancock MRN: [id number] DOB: 23-Dec-1995 Today's Date: 07/02/2023   History of Present Illness The pt is a 27 yo female presenting 11/7 for L5-S1 decompression with bilateral foraminotomies. PMH includes: anemia, anxiety, bipolar disorder, depression, and UTI.    PT Comments  Pt received in supine, agreeable to therapy session with emphasis on safety with stairs and car transfer training. Pt modI for gait with RW support this date and needing up to Supervision for stair/simulated car transfers to lower/higher surface heights in PT gym. Anticipate pt safe to DC home with increased PRN supervision/assist from family once medically cleared. Pt continues to benefit from PT services to progress toward functional mobility goals.    If plan is discharge home, recommend the following: A little help with walking and/or transfers;A little help with bathing/dressing/bathroom;Help with stairs or ramp for entrance;Assistance with cooking/housework;Assist for transportation   Can travel by private vehicle        Equipment Recommendations  Rolling walker (2 wheels);BSC/3in1 (tub bench)    Recommendations for Other Services       Precautions / Restrictions Precautions Precautions: Back;Fall Precaution Booklet Issued: Yes (comment) Precaution Comments: reviewed spinal precautions Required Braces or Orthoses: Spinal Brace Spinal Brace: Lumbar corset;Applied in sitting position Restrictions Weight Bearing Restrictions: No     Mobility  Bed Mobility Overal bed mobility: Needs Assistance Bed Mobility: Rolling, Sidelying to Sit Rolling: Modified independent (Device/Increase time) Sidelying to sit: Supervision       General bed mobility comments: cues for log roll/no twisting    Transfers Overall transfer level: Needs assistance Equipment used: Rolling walker (2 wheels) Transfers: Sit to/from Stand Sit to Stand: Supervision            General transfer comment: from higher and lower surface heights in PT gym to simulate car transfers; also from EOB and recliner heights, no lift assist needed today    Ambulation/Gait Ambulation/Gait assistance: Modified independent (Device/Increase time) Gait Distance (Feet): 100 Feet Assistive device: Rolling walker (2 wheels) Gait Pattern/deviations: Step-through pattern Gait velocity: decreased     General Gait Details: No acute s/sx distress, no orthostatic symptoms today, good RW use; pt compliant with back precs after initial reminder to turn body/not look over her shoulder or twist.   Stairs   Stairs assistance: Supervision Stair Management: Step to pattern, Forwards, Two rails Number of Stairs: 3 General stair comments: shorter steps in PT gym, no steadying assist needed, reinforced to step up with "good" leg for time being so as not to irritate back/LE and down with "bad" leg   Wheelchair Mobility     Tilt Bed    Modified Rankin (Stroke Patients Only)       Balance Overall balance assessment: Needs assistance Sitting-balance support: No upper extremity supported, Feet supported Sitting balance-Leahy Scale: Good     Standing balance support: No upper extremity supported, Single extremity supported Standing balance-Leahy Scale: Fair Standing balance comment: Good with RW support for dynamic tasks                            Cognition Arousal: Alert Behavior During Therapy: WFL for tasks assessed/performed Overall Cognitive Status: Within Functional Limits for tasks assessed  Exercises      General Comments General comments (skin integrity, edema, etc.): no acute s/sx distress or dizziness today      Pertinent Vitals/Pain Pain Assessment Pain Assessment: Faces Faces Pain Scale: Hurts a little bit Pain Location: incision, low back (L>R) Pain Descriptors / Indicators: Discomfort,  Guarding Pain Intervention(s): Monitored during session, Repositioned    Home Living                          Prior Function            PT Goals (current goals can now be found in the care plan section) Acute Rehab PT Goals Patient Stated Goal: return to independence, reduce pain PT Goal Formulation: With patient Time For Goal Achievement: 07/12/23 Progress towards PT goals: Progressing toward goals    Frequency    Min 5X/week      PT Plan      Co-evaluation              AM-PAC PT "6 Clicks" Mobility   Outcome Measure  Help needed turning from your back to your side while in a flat bed without using bedrails?: None Help needed moving from lying on your back to sitting on the side of a flat bed without using bedrails?: A Little Help needed moving to and from a bed to a chair (including a wheelchair)?: A Little Help needed standing up from a chair using your arms (e.g., wheelchair or bedside chair)?: A Little Help needed to walk in hospital room?: A Little Help needed climbing 3-5 steps with a railing? : A Little 6 Click Score: 19    End of Session Equipment Utilized During Treatment: Back brace Activity Tolerance: Patient tolerated treatment well Patient left: in chair;with call bell/phone within reach Nurse Communication: Mobility status PT Visit Diagnosis: Unsteadiness on feet (R26.81);Other abnormalities of gait and mobility (R26.89);Muscle weakness (generalized) (M62.81);Pain Pain - Right/Left: Left Pain - part of body:  (back and hip)     Time: 1730-1746 PT Time Calculation (min) (ACUTE ONLY): 16 min  Charges:    $Therapeutic Activity: 8-22 mins PT General Charges $$ ACUTE PT VISIT: 1 Visit                     Ravenna Legore P., PTA Acute Rehabilitation Services Secure Chat Preferred 9a-5:30pm Office: [phone]    Dorathy Kinsman [facility] 07/02/2023, 6:44 PM

## 2023-07-09 ENCOUNTER — Ambulatory Visit: Payer: Medicaid Other | Attending: Orthopedic Surgery | Admitting: Physical Therapy

## 2023-07-09 ENCOUNTER — Encounter: Payer: Self-pay | Admitting: Physical Therapy

## 2023-07-09 DIAGNOSIS — M5459 Other low back pain: Secondary | ICD-10-CM | POA: Insufficient documentation

## 2023-07-09 DIAGNOSIS — R2689 Other abnormalities of gait and mobility: Secondary | ICD-10-CM | POA: Diagnosis present

## 2023-07-09 NOTE — Therapy (Signed)
OUTPATIENT PHYSICAL THERAPY THORACOLUMBAR EVALUATION   Patient Name: Melanie Hancock MRN: [id number] DOB:1995/08/25, 27 y.o., female Today's Date: 07/09/2023  END OF SESSION:  PT End of Session - 07/09/23 1105     Visit Number 1    Number of Visits 9    Date for PT Re-Evaluation 09/03/23    Authorization Type [employer]    Authorization Time Period auth tbd    PT Start Time 1107   late check in   PT Stop Time 1152    PT Time Calculation (min) 45 min    Equipment Utilized During Treatment Back brace    Activity Tolerance Patient tolerated treatment well    Behavior During Therapy WFL for tasks assessed/performed             Past Medical History:  Diagnosis Date   Anemia    Anxiety    Asthma    Bipolar disorder (HCC)    Blood transfusion without reported diagnosis    Chronic back pain    Depression    GERD (gastroesophageal reflux disease)    Headache    Post partum depression    Pyelonephritis    UTI (lower urinary tract infection)    Past Surgical History:  Procedure Laterality Date   DILATION AND CURETTAGE OF UTERUS     INDUCED ABORTION     LUMBAR LAMINECTOMY/DECOMPRESSION MICRODISCECTOMY Bilateral 06/27/2023   Procedure: L5-S1 DECOMPRESSION WITH BILATERAL FORAMINOTOMIES;  Surgeon: Venita Lick, MD;  Location: [facility] OR;  Service: Orthopedics;  Laterality: Bilateral;  2.5 hrs 3 C-Bed   WISDOM TOOTH EXTRACTION     Patient Active Problem List   Diagnosis Date Noted   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   History of gestational diabetes 09/10/2022   Gastroesophageal reflux disease 03/28/2020   Acute intractable headache 03/14/2020   Anemia 09/30/2019   History of postpartum depression, currently pregnant 01/23/2016   Iron deficiency anemia 01/02/2016   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   Herpes simplex 11/06/2015   Trichomoniasis 06/17/2015   Anxiety 03/28/2014   Chronic back pain 03/28/2014   Hx pyelonephritis 2014 05/26/2013   History of chlamydia  08/21/2007    [facility]  REFERRING PROVIDER: Venita Lick, MD  REFERRING DIAG: R53.1 (ICD-10-CM) - Weakness M51.26 (ICD-10-CM) - Lumbar disc herniation  Rationale for Evaluation and Treatment: Rehabilitation  THERAPY DIAG:  Other low back pain  Other abnormalities of gait and mobility  ONSET DATE: DOS 06/27/23  SUBJECTIVE:  SUBJECTIVE STATEMENT: Pt reports an injury at work in October of last year - states she injured her knee/foot and feels it affected her walking. She states over time she began to develop back pain, went through a couple bouts of PT with limited relief, ultimately culminating in L5-S1 decompression with bilateral foraminotomies. Pt endorses fairly significant mobility deficits while in hospital, states they were considering rehab placement on discharge but improved and went home. She endorses some initial difficulty once home but is now able to access home with 16STE. Remains limited with prolonged activity and is requiring assist for housework/childcare. Pt denies any buckling, no issues with bowel/bladder, no LE N/T. Pt states she was placed on BLT precautions and brace use. Is having spasms/stiffness in back at times.    PERTINENT HISTORY:  anemia, anxiety/depression, GERD, headaches, L5-S1 decompression with bilat foraminotomies 06/27/23  PAIN:  Are you having pain: 7-8/10 Location/description: surgical site; aching Best-worst over past week: 7-10/10  - aggravating factors: random spasms, fatigue with walking - Easing factors: brace, medication, rest    PRECAUTIONS: spine   WEIGHT BEARING RESTRICTIONS: No  FALLS:  Has patient fallen in last 6 months? No  LIVING ENVIRONMENT: Apartment; 1 level but has 16STE BIL rails Lives w/ 3 and 7 y.o  children Currently getting assistance from parents for housework  OCCUPATION: call center; works in office, also drives for [employer]  PLOF: Independent  PATIENT GOALS: strengthening, get back to normal, be able to interact with kids better again  NEXT MD VISIT: November 20th  OBJECTIVE:  Note: Objective measures were completed at Evaluation unless otherwise noted.  DIAGNOSTIC FINDINGS:  DOS 06/27/23  PATIENT SURVEYS:  FOTO deferred on eval given time constraints  SCREENING FOR RED FLAGS: Pt denies bowel/bladder issues, N/T, overt buckling/weakness  COGNITION: Overall cognitive status: Within functional limits for tasks assessed     SENSATION: Pt denies sensory complaints   POSTURE: lumbar brace, increased rounded shoulders BIL, mild fwd head  PALPATION/OBSERVATION: Deferred given brace  LUMBAR ROM:   AROM eval  Flexion   Extension   Right lateral flexion   Left lateral flexion   Right rotation   Left rotation    (Blank rows = not tested) (Key: WFL = within functional limits not formally assessed, * = concordant pain, s = stiffness/stretching sensation, NT = not tested)  Comments: deferred given acuity of surgery  LOWER EXTREMITY ROM:     Active  Right eval Left eval  Hip flexion    Hip extension    Hip internal rotation    Hip external rotation    Knee extension    Knee flexion    (Blank rows = not tested) (Key: WFL = within functional limits not formally assessed, * = concordant pain, s = stiffness/stretching sensation, NT = not tested)  Comments:    LOWER EXTREMITY MMT:    MMT Right eval Left eval  Hip flexion    Hip abduction (modified sitting)    Hip internal rotation    Hip external rotation    Knee flexion    Knee extension    Ankle dorsiflexion     (Blank rows = not tested) (Key: WFL = within functional limits not formally assessed, * = concordant pain, s = stiffness/stretching sensation, NT = not tested)  Comments:    LUMBAR SPECIAL  TESTS:  Deferred given surgery  FUNCTIONAL TESTS:  TUG: 10.33sec no AD, gait mechanics as below 5xSTS: 18.66sec gentle UE support    GAIT: Distance walked:  within clinic Assistive device utilized: None Level of assistance: Complete Independence Comments: increased lateral weight shifting bilaterally, reduced truncal rotation and arm swing as expected w/ brace use; reduced gait speed/cadence  TODAY'S TREATMENT:                                                                                                                              [facility] Adult PT Treatment:                                                DATE: 07/09/23 Deferred given time constraints, increased time w/ education/discussion   PATIENT EDUCATION:  Education details: Pt education on PT impairments, prognosis, and POC. Informed consent. Rationale for interventions Person educated: Patient Education method: Explanation, Demonstration, Tactile cues, Verbal cues Education comprehension: verbalized understanding, returned demonstration, verbal cues required, tactile cues required, and needs further education    HOME EXERCISE PROGRAM: Deferred on eval  ASSESSMENT:  CLINICAL IMPRESSION: Patient is a pleasant 27 y.o. woman who was seen today for physical therapy evaluation and treatment for weakness and mobility deficits s/p L5-S1 decompression/foraminotomies, DOS 06/27/23. Pt endorses initial difficulty with mobilization in hospital after surgery, was initially difficult at home as well but pt states this is improving. Remains limited with prolonged activity, also reports soreness/stiffness around surgical site as expected. No red flags in discussion today. Deferred focal MSK exam given acuity of surgery, 5xSTS is indicative of fall risk and well outside age norm (MCID of 2.3sec, age cohort norm 6.2+/-1.3sec per Billie Ruddy et al 2007).  TUG time is not in fall risk category but does demonstrate altered gait mechanics. Increased time  with subjective/education today, going over gradual progression of functional mobility within pt tolerance, monitoring symptoms and adjusting accordingly. Pt also voicing concerns about post op restrictions, reviewed BLT and brace use, encouraged to discuss further with surgeon at their appt tomorrow. No adverse events, pt endorses low back stiffness/fatigue throughout but overall tolerates well, follows post op restrictions in clinic without issue. Recommend skilled PT to address aforementioned deficits with aim of improving functional tolerance and reducing pain with typical activities. Pt departs today's session in no acute distress, all voiced concerns/questions addressed appropriately from PT perspective.    OBJECTIVE IMPAIRMENTS: Abnormal gait, decreased activity tolerance, decreased balance, decreased endurance, decreased mobility, difficulty walking, decreased ROM, decreased strength, improper body mechanics, and pain.   ACTIVITY LIMITATIONS: carrying, lifting, bending, sitting, standing, stairs, transfers, and locomotion level  PARTICIPATION LIMITATIONS: meal prep, cleaning, laundry, driving, community activity, and occupation  PERSONAL FACTORS: Time since onset of injury/illness/exacerbation and 1-2 comorbidities: anxiety/depression  are also affecting patient's functional outcome.   REHAB POTENTIAL: Good  CLINICAL DECISION MAKING: Evolving/moderate complexity  EVALUATION COMPLEXITY: Low   GOALS: Goals reviewed with patient? Yes  SHORT TERM GOALS: Target date: 08/06/2023 Pt will  demonstrate appropriate understanding and performance of initially prescribed HEP in order to facilitate improved independence with management of symptoms.  Baseline: HEP TBD Goal status: INITIAL   2. Pt will improved at least MCID on FOTO in order to demonstrate improved perception of function due to symptoms.  Baseline: FOTO TBD  Goal status: INITIAL    LONG TERM GOALS: Target date: 09/03/2023 Pt will  meet predicted score on FOTO in order to demonstrate improved perception of functional status due to symptoms.  Baseline: FOTO TBD Goal status: INITIAL  2.  Pt will report at least 50% decrease in overall pain levels in past week in order to facilitate improved tolerance to basic ADLs/mobility.   Baseline: 7-10/10  Goal status: INITIAL    3.  Pt will be able to perform TUG in less than or equal to 8 sec in order to indicate reduced risk of falling (cutoff score for fall risk 13.5 sec in community dwelling older adults per [facility] et al, 2000)  Baseline: 10.66sec  Goal status: INITIAL    4. Pt will perform 5xSTS in <12 sec in order to demonstrate reduced fall risk and improved functional independence. (MCID of 2.3sec)  Baseline: 18sec  Goal status: INITIAL   5. Pt will demonstrate appropriate performance of final prescribed HEP in order to facilitate improved self-management of symptoms post-discharge.   Baseline: HEP TBD  Goal status: INITIAL    PLAN:  PT FREQUENCY: 1x/week  PT DURATION: 8 weeks  PLANNED INTERVENTIONS: 97164- PT Re-evaluation, 97110-Therapeutic exercises, 97530- Therapeutic activity, 97112- Neuromuscular re-education, 97535- Self Care, [id number]- Manual therapy, [phone]- Gait training, Patient/Family education, Balance training, Stair training, Taping, Joint mobilization, Spinal mobilization, Scar mobilization, Cryotherapy, and Moist heat.  PLAN FOR NEXT SESSION: establish HEP/FOTO. Emphasis on transfer training, graded walking program throughout day. Work on Animator. Continue with brace/BLT precautions until lifted by surgeon.    Ashley Murrain PT, DPT 07/09/2023 12:28 PM    Medicaid Berkley Harvey: For all possible CPT codes, reference the Planned Interventions line above.     Check all conditions that are expected to impact treatment: {Conditions expected to impact treatment:Musculoskeletal disorders   If treatment provided at initial evaluation, no treatment  charged due to lack of authorization.

## 2023-07-16 ENCOUNTER — Ambulatory Visit: Payer: Medicaid Other | Admitting: Physical Therapy

## 2023-07-25 ENCOUNTER — Ambulatory Visit: Payer: Medicaid Other | Admitting: Physical Therapy

## 2023-08-01 ENCOUNTER — Encounter: Payer: Medicaid Other | Admitting: Physical Therapy

## 2023-08-08 ENCOUNTER — Encounter: Payer: Medicaid Other | Admitting: Physical Therapy

## 2023-08-15 ENCOUNTER — Encounter: Payer: Medicaid Other | Admitting: Physical Therapy

## 2023-09-09 ENCOUNTER — Inpatient Hospital Stay (HOSPITAL_COMMUNITY)
Admission: AD | Admit: 2023-09-09 | Discharge: 2023-09-09 | Disposition: A | Discharge status: Home / Self Care | Payer: Medicaid Other | Attending: Obstetrics and Gynecology | Admitting: Obstetrics and Gynecology

## 2023-09-09 ENCOUNTER — Encounter (HOSPITAL_COMMUNITY): Payer: Self-pay | Admitting: Obstetrics and Gynecology

## 2023-09-09 ENCOUNTER — Inpatient Hospital Stay (HOSPITAL_COMMUNITY): Payer: Medicaid Other

## 2023-09-09 DIAGNOSIS — O219 Vomiting of pregnancy, unspecified: Secondary | ICD-10-CM

## 2023-09-09 DIAGNOSIS — K5900 Constipation, unspecified: Secondary | ICD-10-CM | POA: Diagnosis not present

## 2023-09-09 DIAGNOSIS — R42 Dizziness and giddiness: Secondary | ICD-10-CM | POA: Diagnosis not present

## 2023-09-09 DIAGNOSIS — O26891 Other specified pregnancy related conditions, first trimester: Secondary | ICD-10-CM | POA: Insufficient documentation

## 2023-09-09 DIAGNOSIS — R102 Pelvic and perineal pain: Secondary | ICD-10-CM | POA: Diagnosis not present

## 2023-09-09 DIAGNOSIS — Z3A01 Less than 8 weeks gestation of pregnancy: Secondary | ICD-10-CM | POA: Diagnosis not present

## 2023-09-09 DIAGNOSIS — O368310 Maternal care for abnormalities of the fetal heart rate or rhythm, first trimester, not applicable or unspecified: Secondary | ICD-10-CM | POA: Insufficient documentation

## 2023-09-09 DIAGNOSIS — O3680X0 Pregnancy with inconclusive fetal viability, not applicable or unspecified: Secondary | ICD-10-CM

## 2023-09-09 DIAGNOSIS — N10 Acute tubulo-interstitial nephritis: Secondary | ICD-10-CM

## 2023-09-09 DIAGNOSIS — R1013 Epigastric pain: Secondary | ICD-10-CM | POA: Diagnosis not present

## 2023-09-09 LAB — CBC
HCT: 32.3 % — ABNORMAL LOW (ref 36.0–46.0)
Hemoglobin: 9.8 g/dL — ABNORMAL LOW (ref 12.0–15.0)
MCH: 22.1 pg — ABNORMAL LOW (ref 26.0–34.0)
MCHC: 30.3 g/dL (ref 30.0–36.0)
MCV: 72.7 fL — ABNORMAL LOW (ref 80.0–100.0)
Platelets: 485 10*3/uL — ABNORMAL HIGH (ref 150–400)
RBC: 4.44 MIL/uL (ref 3.87–5.11)
RDW: 15.9 % — ABNORMAL HIGH (ref 11.5–15.5)
WBC: 7.2 10*3/uL (ref 4.0–10.5)
nRBC: 0 % (ref 0.0–0.2)

## 2023-09-09 LAB — COMPREHENSIVE METABOLIC PANEL
ALT: 17 U/L (ref 0–44)
AST: 18 U/L (ref 15–41)
Albumin: 3.9 g/dL (ref 3.5–5.0)
Alkaline Phosphatase: 62 U/L (ref 38–126)
Anion gap: 6 (ref 5–15)
BUN: 8 mg/dL (ref 6–20)
CO2: 23 mmol/L (ref 22–32)
Calcium: 9.1 mg/dL (ref 8.9–10.3)
Chloride: 105 mmol/L (ref 98–111)
Creatinine, Ser: 0.82 mg/dL (ref 0.44–1.00)
GFR, Estimated: >60 mL/min (ref >60)
Glucose, Bld: 89 mg/dL (ref 70–99)
Potassium: 3.9 mmol/L (ref 3.5–5.1)
Sodium: 134 mmol/L — ABNORMAL LOW (ref 135–145)
Total Bilirubin: 0.5 mg/dL (ref 0.0–1.2)
Total Protein: 7.9 g/dL (ref 6.5–8.1)

## 2023-09-09 LAB — HCG, QUANTITATIVE, PREGNANCY: hCG, Beta Chain, Quant, S: 45083 m[IU]/mL — ABNORMAL HIGH (ref <5)

## 2023-09-09 LAB — URINALYSIS, ROUTINE W REFLEX MICROSCOPIC
Bilirubin Urine: NEGATIVE
Glucose, UA: NEGATIVE mg/dL
Hgb urine dipstick: NEGATIVE
Ketones, ur: 5 mg/dL — AB
Leukocytes,Ua: NEGATIVE
Nitrite: NEGATIVE
Protein, ur: NEGATIVE mg/dL
Specific Gravity, Urine: 1.016 (ref 1.005–1.030)
pH: 7 (ref 5.0–8.0)

## 2023-09-09 LAB — WET PREP, GENITAL
Clue Cells Wet Prep HPF POC: NONE SEEN
Sperm: NONE SEEN
Trich, Wet Prep: NONE SEEN
WBC, Wet Prep HPF POC: <10 (ref <10)
Yeast Wet Prep HPF POC: NONE SEEN

## 2023-09-09 LAB — POCT PREGNANCY, URINE: Preg Test, Ur: POSITIVE — AB

## 2023-09-09 MED ORDER — ACETAMINOPHEN-CAFFEINE 500-65 MG PO TABS
2.0000 | ORAL_TABLET | Freq: Once | ORAL | Status: AC
  Administered 2023-09-09: 2 via ORAL
  Filled 2023-09-09: qty 2

## 2023-09-09 MED ORDER — BISACODYL 10 MG RE SUPP: 10.0000 mg | RECTAL | 0 refills | Status: DC | PRN

## 2023-09-09 MED ORDER — ONDANSETRON 4 MG PO TBDP
4.0000 mg | ORAL_TABLET | Freq: Four times a day (QID) | ORAL | 0 refills | Status: DC | PRN
Start: 2023-09-09 — End: 2023-10-11

## 2023-09-09 MED ORDER — SCOPOLAMINE 1 MG/3DAYS TD PT72SCOPOLAMINE 1 MG/3DAYS
1.0000 | MEDICATED_PATCH | Freq: Once | TRANSDERMAL | Status: DC
Start: 2023-09-09 — End: 2023-09-10
  Administered 2023-09-09: 1.5 mg via TRANSDERMAL
  Filled 2023-09-09: qty 1

## 2023-09-09 MED ORDER — PROMETHAZINE HCL 25 MG PO TABS: 25.0000 mg | ORAL_TABLET | Freq: Four times a day (QID) | ORAL | 2 refills | Status: DC | PRN

## 2023-09-09 MED ORDER — SUCRALFATE 1 GM/10ML PO SUSP
1.0000 g | Freq: Three times a day (TID) | ORAL | Status: DC
  Administered 2023-09-09: 1 g via ORAL
  Filled 2023-09-09 (×2): qty 10

## 2023-09-09 MED ORDER — SCOPOLAMINE 1 MG/3DAYS TD PT72: 1.0000 | MEDICATED_PATCH | TRANSDERMAL | 0 refills | Status: DC

## 2023-09-09 MED ORDER — ONDANSETRON 4 MG PO TBDP
4.0000 mg | ORAL_TABLET | Freq: Once | ORAL | Status: AC
  Administered 2023-09-09: 4 mg via ORAL
  Filled 2023-09-09: qty 1

## 2023-09-09 NOTE — MAU Note (Signed)
Pt says HPT- on last week - positive - not confirmed anywhere. Started vomiting - yesterday  Also H/A Blurry vision [facility] - pt asked for nausea meds - told to come here. Has upper and lower abd pain.  Feels pressure in lower abd . No VB

## 2023-09-09 NOTE — MAU Provider Note (Signed)
Chief Complaint: Abdominal Pain and Emesis   Event Date/Time   First Provider Initiated Contact with Patient 09/09/23 2032        SUBJECTIVE HPI: Melanie Hancock is a 28 y.o. [id number] at [redacted]w[redacted]d by LMP who presents via EMS to maternity admissions reporting nausea, vomiting and headache. Also has had some dizziness and abdominal pain/pressure.   Had a positive pregnancy test last week, but was told by family doctor to come here for treatment. She denies vaginal bleeding, vaginal itching/burning, urinary symptoms, or fever/chills.    States plans on terminating pregnancy due to nausea and vomiting States had hyperemesis in past pregnancies and doesn't want to go through that again.  Abdominal Pain This is a new problem. The quality of the pain is cramping. Associated symptoms include nausea and vomiting. Pertinent negatives include no constipation, diarrhea, dysuria, fever, frequency or myalgias. Nothing aggravates the pain. The pain is relieved by Nothing. She has tried nothing for the symptoms.  Emesis  This is a new problem. There has been no fever. Associated symptoms include abdominal pain. Pertinent negatives include no diarrhea, fever or myalgias. She has tried nothing for the symptoms.   RN Note: Pt says HPT- on last week - positive - not confirmed anywhere. Started vomiting - yesterday  Also H/A Blurry vision [facility] - pt asked for nausea meds - told to come here. Has upper and lower abd pain.  Feels pressure in lower abd . No VB  Past Medical History:  Diagnosis Date   Anemia    Anxiety    Asthma    Bipolar disorder (HCC)    Blood transfusion without reported diagnosis    Chronic back pain    Depression    GERD (gastroesophageal reflux disease)    Headache    Post partum depression    Pyelonephritis    UTI (lower urinary tract infection)    Past Surgical History:  Procedure Laterality Date   DILATION AND CURETTAGE OF UTERUS     INDUCED ABORTION      LUMBAR LAMINECTOMY/DECOMPRESSION MICRODISCECTOMY Bilateral 06/27/2023   Procedure: L5-S1 DECOMPRESSION WITH BILATERAL FORAMINOTOMIES;  Surgeon: Venita Lick, MD;  Location: [facility] OR;  Service: Orthopedics;  Laterality: Bilateral;  2.5 hrs 3 C-Bed   WISDOM TOOTH EXTRACTION     Social History   Socioeconomic History   Marital status: Single    Spouse name: Not on file   Number of children: 4   Years of education: Not on file   Highest education level: Associate degree: occupational, Scientist, product/process development, or vocational program  Occupational History    Employer: [employer]  Tobacco Use   Smoking status: Every Day    Types: E-cigarettes   Smokeless tobacco: Never  Vaping Use   Vaping status: Every Day   Substances: Nicotine, Flavoring  Substance and Sexual Activity   Alcohol use: Yes    Comment: maybe 1-2 drinks every other weekend   Drug use: Yes    Types: Marijuana    Comment: every day   Sexual activity: Yes    Birth control/protection: None  Other Topics Concern   Not on file  Social History Narrative   Not on file   Social Drivers of Health   Financial Resource Strain: Not on file  Food Insecurity: No Food Insecurity (06/28/2023)   Hunger Vital Sign    Worried About Running Out of Food in the Last Year: Never true    Ran Out of Food in the Last Year:  Never true  Transportation Needs: No Transportation Needs (06/28/2023)   PRAPARE - Administrator, Civil Service (Medical): No    Lack of Transportation (Non-Medical): No  Physical Activity: Not on file  Stress: Not on file  Social Connections: Unknown (08/07/2022)   Received from [facility]   Social Network    Social Network: Not on file  Intimate Partner Violence: Not At Risk (06/28/2023)   Humiliation, Afraid, Rape, and Kick questionnaire    Fear of Current or Ex-Partner: No    Emotionally Abused: No    Physically Abused: No    Sexually Abused: No   No current facility-administered medications on  file prior to encounter.   Current Outpatient Medications on File Prior to Encounter  Medication Sig Dispense Refill   fluticasone (FLONASE) 50 MCG/ACT nasal spray Place 2 sprays into both nostrils daily as needed for allergies.     gabapentin (NEURONTIN) 300 MG capsule Take 1 capsule (300 mg total) by mouth 3 (three) times daily as needed for up to 14 days. 42 capsule 0   ondansetron (ZOFRAN) 4 MG tablet Take 1 tablet (4 mg total) by mouth every 8 (eight) hours as needed for nausea or vomiting. 20 tablet 0   risperiDONE (RISPERDAL) 0.5 MG tablet Take 0.5 mg by mouth at bedtime as needed (depression).     [DISCONTINUED] pantoprazole (PROTONIX) 20 MG tablet Take 1 tablet (20 mg total) by mouth daily. 30 tablet 2   Allergies  Allergen Reactions   Iodine     Pt states it burns and itches     Latex Itching   Mushroom Extract Complex (Do Not Select)     Throat itches     I have reviewed patient's Past Medical Hx, Surgical Hx, Family Hx, Social Hx, medications and allergies.   ROS:  Review of Systems  Constitutional:  Negative for fever.  Gastrointestinal:  Positive for abdominal pain, nausea and vomiting. Negative for constipation and diarrhea.  Genitourinary:  Negative for dysuria and frequency.  Musculoskeletal:  Negative for myalgias.   Review of Systems  Other systems negative   Physical Exam  Physical Exam Patient Vitals for the past 24 hrs:  BP Temp Temp src Pulse Resp Height Weight  09/09/23 2010 (!) 110/48 98 F (36.7 C) Oral (!) 53 16 5\' 5"  (1.651 m) 79 kg   Constitutional: Well-developed, well-nourished female in no acute distress.  Cardiovascular: normal rate Respiratory: normal effort MS: Extremities nontender, no edema, normal ROM Neurologic: Alert and oriented x 4.  GU: Neg CVAT.  PELVIC EXAM: Deferred in lieu of transvaginal ultrasound   LAB RESULTS Results for orders placed or performed during the hospital encounter of 09/09/23 (from the past 24 hours)   Urinalysis, Routine w reflex microscopic -Urine, Clean Catch     Status: Abnormal   Collection Time: 09/09/23  8:14 PM  Result Value Ref Range   Color, Urine YELLOW YELLOW   APPearance HAZY (A) CLEAR   Specific Gravity, Urine 1.016 1.005 - 1.030   pH 7.0 5.0 - 8.0   Glucose, UA NEGATIVE NEGATIVE mg/dL   Hgb urine dipstick NEGATIVE NEGATIVE   Bilirubin Urine NEGATIVE NEGATIVE   Ketones, ur 5 (A) NEGATIVE mg/dL   Protein, ur NEGATIVE NEGATIVE mg/dL   Nitrite NEGATIVE NEGATIVE   Leukocytes,Ua NEGATIVE NEGATIVE  Wet prep, genital     Status: None   Collection Time: 09/09/23  8:17 PM  Result Value Ref Range   Yeast Wet Prep HPF  POC NONE SEEN NONE SEEN   Trich, Wet Prep NONE SEEN NONE SEEN   Clue Cells Wet Prep HPF POC NONE SEEN NONE SEEN   WBC, Wet Prep HPF POC <10 <10   Sperm NONE SEEN   Pregnancy, urine POC     Status: Abnormal   Collection Time: 09/09/23  8:19 PM  Result Value Ref Range   Preg Test, Ur POSITIVE (A) NEGATIVE  hCG, quantitative, pregnancy     Status: Abnormal   Collection Time: 09/09/23  8:46 PM  Result Value Ref Range   hCG, Beta Chain, Quant, S 45,083 (H) <5 mIU/mL  CBC     Status: Abnormal   Collection Time: 09/09/23  8:46 PM  Result Value Ref Range   WBC 7.2 4.0 - 10.5 K/uL   RBC 4.44 3.87 - 5.11 MIL/uL   Hemoglobin 9.8 (L) 12.0 - 15.0 g/dL   HCT 40.9 (L) 81.1 - 91.4 %   MCV 72.7 (L) 80.0 - 100.0 fL   MCH 22.1 (L) 26.0 - 34.0 pg   MCHC 30.3 30.0 - 36.0 g/dL   RDW 78.2 (H) 95.6 - 21.3 %   Platelets 485 (H) 150 - 400 K/uL   nRBC 0.0 0.0 - 0.2 %  Comprehensive metabolic panel     Status: Abnormal   Collection Time: 09/09/23  8:46 PM  Result Value Ref Range   Sodium 134 (L) 135 - 145 mmol/L   Potassium 3.9 3.5 - 5.1 mmol/L   Chloride 105 98 - 111 mmol/L   CO2 23 22 - 32 mmol/L   Glucose, Bld 89 70 - 99 mg/dL   BUN 8 6 - 20 mg/dL   Creatinine, Ser 0.86 0.44 - 1.00 mg/dL   Calcium 9.1 8.9 - 57.8 mg/dL   Total Protein 7.9 6.5 - 8.1 g/dL   Albumin 3.9  3.5 - 5.0 g/dL   AST 18 15 - 41 U/L   ALT 17 0 - 44 U/L   Alkaline Phosphatase 62 38 - 126 U/L   Total Bilirubin 0.5 0.0 - 1.2 mg/dL   GFR, Estimated >46 >96 mL/min   Anion gap 6 5 - 15        IMAGING US OB LESS THAN 14 WEEKS WITH OB TRANSVAGINAL Result Date: 09/09/2023 CLINICAL DATA:  Pelvic pain EXAM: OBSTETRIC <14 WK [place] AND TRANSVAGINAL OB US TECHNIQUE: Both transabdominal and transvaginal ultrasound examinations were performed for complete evaluation of the gestation as well as the maternal uterus, adnexal regions, and pelvic cul-de-sac. Transvaginal technique was performed to assess early pregnancy. COMPARISON:  None available FINDINGS: Intrauterine gestational sac: Single Yolk sac:  Visualized. Embryo:  Visualized. Cardiac Activity: Visualized. Heart Rate: 90 bpm MSD:   mm    w     d CRL:  7.8 mm   5 w   6 d                  [place] EDC: 05/05/2024 Subchorionic hemorrhage:  None visualized. Maternal uterus/adnexae: No adnexal mass or free fluid. IMPRESSION: Five week 6 day intrauterine pregnancy. Fetal bradycardia with fetal heart rate 90 beats per minute, likely related to early gestational age. This could be followed with repeat ultrasound in 10-14 days to ensure expected progression. Electronically Signed   By: Charlett Nose M.D.   On: 09/09/2023 21:08     MAU Management/MDM: I have reviewed the triage vital signs and the nursing notes.   Pertinent labs & imaging results that were available during my care  of the patient were reviewed by me and considered in my medical decision making (see chart for details).      I have reviewed her medical records including past results, notes and treatments. Medical, Surgical, and family history were reviewed.  Medications and recent lab tests were reviewed  Ordered usual first trimester r/o ectopic labs.   Pelvic cultures done Will check baseline Ultrasound to rule out ectopic.> SIUP seen   Treatments in MAU included Zofran and scopolamine for  nausea.  Excedrin Tension for headache.  Carafate for epigastric pain.   This bleeding/pain can represent a normal pregnancy with bleeding, spontaneous abortion or even an ectopic which can be life-threatening.  The process as listed above helps to determine which of these is present.    ASSESSMENT Single IUP at [redacted]w[redacted]d Epigastric pain due to vomiting Nausea and vomiting Lower abdominal pain Pregnancy of unknown location, now found to be intrauterine   PLAN Discharge home Plan to repeat HCG level in 48 hours in clinic per 11:00 am schedule Will repeat  Ultrasound in about 7-10 days if HCG levels double appropriately  Ectopic precautions   Pt stable at time of discharge. Encouraged to return here if she develops worsening of symptoms, increase in pain, fever, or other concerning symptoms.    Wynelle Bourgeois CNM, MSN Certified Nurse-Midwife 09/09/2023  8:32 PM

## 2023-09-10 ENCOUNTER — Other Ambulatory Visit: Payer: Self-pay

## 2023-09-10 ENCOUNTER — Inpatient Hospital Stay (HOSPITAL_COMMUNITY)
Admission: AD | Admit: 2023-09-10 | Discharge: 2023-09-11 | Disposition: A | Discharge status: Home / Self Care | Payer: Medicaid Other | Attending: Obstetrics and Gynecology | Admitting: Obstetrics and Gynecology

## 2023-09-10 ENCOUNTER — Encounter (HOSPITAL_COMMUNITY): Payer: Self-pay | Admitting: Emergency Medicine

## 2023-09-10 DIAGNOSIS — R109 Unspecified abdominal pain: Secondary | ICD-10-CM | POA: Diagnosis not present

## 2023-09-10 DIAGNOSIS — O26891 Other specified pregnancy related conditions, first trimester: Secondary | ICD-10-CM | POA: Diagnosis present

## 2023-09-10 DIAGNOSIS — Z3A01 Less than 8 weeks gestation of pregnancy: Secondary | ICD-10-CM | POA: Diagnosis not present

## 2023-09-10 DIAGNOSIS — F319 Bipolar disorder, unspecified: Secondary | ICD-10-CM | POA: Insufficient documentation

## 2023-09-10 DIAGNOSIS — O99341 Other mental disorders complicating pregnancy, first trimester: Secondary | ICD-10-CM | POA: Insufficient documentation

## 2023-09-10 DIAGNOSIS — F12188 Cannabis abuse with other cannabis-induced disorder: Secondary | ICD-10-CM

## 2023-09-10 DIAGNOSIS — R112 Nausea with vomiting, unspecified: Secondary | ICD-10-CM

## 2023-09-10 DIAGNOSIS — O210 Mild hyperemesis gravidarum: Secondary | ICD-10-CM | POA: Diagnosis not present

## 2023-09-10 LAB — CBC
HCT: 34.7 % — ABNORMAL LOW (ref 36.0–46.0)
Hemoglobin: 10.5 g/dL — ABNORMAL LOW (ref 12.0–15.0)
MCH: 22.3 pg — ABNORMAL LOW (ref 26.0–34.0)
MCHC: 30.3 g/dL (ref 30.0–36.0)
MCV: 73.7 fL — ABNORMAL LOW (ref 80.0–100.0)
Platelets: 531 10*3/uL — ABNORMAL HIGH (ref 150–400)
RBC: 4.71 MIL/uL (ref 3.87–5.11)
RDW: 15.8 % — ABNORMAL HIGH (ref 11.5–15.5)
WBC: 6.7 10*3/uL (ref 4.0–10.5)
nRBC: 0 % (ref 0.0–0.2)

## 2023-09-10 LAB — GC/CHLAMYDIA PROBE AMP (~~LOC~~) NOT AT ARMC
Chlamydia: NEGATIVE
Comment: NEGATIVE
Comment: NORMAL
Neisseria Gonorrhea: NEGATIVE

## 2023-09-10 NOTE — ED Provider Notes (Signed)
[place] EMERGENCY DEPARTMENT AT [facility] Provider Note   CSN: [id number] Arrival date & time: 09/10/23  2302     History {Add pertinent medical, surgical, social history, OB history to HPI:1} Chief Complaint  Patient presents with   Abdominal Pain    Melanie Hancock is a 28 y.o. female.   Abdominal Pain      Home Medications Prior to Admission medications   Medication Sig Start Date End Date Taking? Authorizing Provider  bisacodyl (DULCOLAX) 10 MG suppository Place 1 suppository (10 mg total) rectally as needed for moderate constipation. 09/09/23   Aviva Signs, CNM  fluticasone (FLONASE) 50 MCG/ACT nasal spray Place 2 sprays into both nostrils daily as needed for allergies. 09/03/22   [provider]  gabapentin (NEURONTIN) 300 MG capsule Take 1 capsule (300 mg total) by mouth 3 (three) times daily as needed for up to 14 days. 06/27/23 07/11/23  Venita Lick, MD  ondansetron (ZOFRAN-ODT) 4 MG disintegrating tablet Take 1 tablet (4 mg total) by mouth every 6 (six) hours as needed for nausea. 09/09/23   Aviva Signs, CNM  promethazine (PHENERGAN) 25 MG tablet Take 1 tablet (25 mg total) by mouth every 6 (six) hours as needed for nausea or vomiting. 09/09/23   Aviva Signs, CNM  risperiDONE (RISPERDAL) 0.5 MG tablet Take 0.5 mg by mouth at bedtime as needed (depression). 03/05/23   [provider]  scopolamine (TRANSDERM-SCOP) 1 MG/3DAYS Place 1 patch (1.5 mg total) onto the skin every 3 (three) days. 09/09/23   Aviva Signs, CNM  pantoprazole (PROTONIX) 20 MG tablet Take 1 tablet (20 mg total) by mouth daily. 09/17/19 11/12/19  Aviva Signs, CNM      Allergies    Iodine, Latex, and Mushroom extract complex (do not select)    Review of Systems   Review of Systems  Gastrointestinal:  Positive for abdominal pain.    Physical Exam Updated Vital Signs BP 112/70   Pulse (!) 58   Temp 98 F (36.7 C)   Resp 16   Ht 5\' 5"   (1.651 m)   Wt 80 kg   LMP 07/30/2023   SpO2 99%   Breastfeeding Unknown   BMI 29.35 kg/m  Physical Exam  ED Results / Procedures / Treatments   Labs (all labs ordered are listed, but only abnormal results are displayed) Labs Reviewed  LIPASE, BLOOD  COMPREHENSIVE METABOLIC PANEL  CBC  URINALYSIS, ROUTINE W REFLEX MICROSCOPIC  HCG, SERUM, QUALITATIVE    EKG None  Radiology US OB LESS THAN 14 WEEKS WITH OB TRANSVAGINAL Result Date: 09/09/2023 CLINICAL DATA:  Pelvic pain EXAM: OBSTETRIC <14 WK [place] AND TRANSVAGINAL OB US TECHNIQUE: Both transabdominal and transvaginal ultrasound examinations were performed for complete evaluation of the gestation as well as the maternal uterus, adnexal regions, and pelvic cul-de-sac. Transvaginal technique was performed to assess early pregnancy. COMPARISON:  None available FINDINGS: Intrauterine gestational sac: Single Yolk sac:  Visualized. Embryo:  Visualized. Cardiac Activity: Visualized. Heart Rate: 90 bpm MSD:   mm    w     d CRL:  7.8 mm   5 w   6 d                  [place] EDC: 05/05/2024 Subchorionic hemorrhage:  None visualized. Maternal uterus/adnexae: No adnexal mass or free fluid. IMPRESSION: Five week 6 day intrauterine pregnancy. Fetal bradycardia with fetal heart rate 90 beats per minute, likely related to early gestational  age. This could be followed with repeat ultrasound in 10-14 days to ensure expected progression. Electronically Signed   By: Charlett Nose M.D.   On: 09/09/2023 21:08    Procedures Procedures  {Document cardiac monitor, telemetry assessment procedure when appropriate:1}  Medications Ordered in ED Medications - No data to display  ED Course/ Medical Decision Making/ A&P   {   Click here for ABCD2, HEART and other calculatorsREFRESH Note before signing :1}                              Medical Decision Making Amount and/or Complexity of Data Reviewed Labs: ordered.   This patient is a 28 y.o. female  who presents to  the ED for concern of anxiety, chronic back pain, depression, GERD, bipolar disorder.   Differential diagnoses prior to evaluation: The emergent differential diagnosis includes, but is not limited to,  *** . This is not an exhaustive differential.   Past Medical History / Co-morbidities / Social History: ***  Additional history: Chart reviewed. Pertinent results include: Reviewed note from MAU visit yesterday. Patient had IUP confirmed. Was discharged with Rx Phenergan, Zofran and Scopolamine for nausea.   Physical Exam: Physical exam performed. The pertinent findings include: ***  Lab Tests/Imaging studies: I personally interpreted labs/imaging and the pertinent results include:  ***. ***I agree with the radiologist interpretation.  Cardiac monitoring: EKG obtained and interpreted by myself and attending physician which shows: ***   Medications: I ordered medication including ***.  I have reviewed the patients home medicines and have made adjustments as needed.   Disposition: After consideration of the diagnostic results and the patients response to treatment, I feel that *** .   ***emergency department workup does not suggest an emergent condition requiring admission or immediate intervention beyond what has been performed at this time. The plan is: ***. The patient is safe for discharge and has been instructed to return immediately for worsening symptoms, change in symptoms or any other concerns.  Final Clinical Impression(s) / ED Diagnoses Final diagnoses:  None    Rx / DC Orders ED Discharge Orders     None      Portions of this report may have been transcribed using voice recognition software. Every effort was made to ensure accuracy; however, inadvertent computerized transcription errors may be present.

## 2023-09-10 NOTE — ED Triage Notes (Signed)
Patient c/o abdominal pain x4 days. Patient report nausea and vomitting x 8 today. Patient was prescribe with zofran but without relief. Patient positive pregnancy last week.

## 2023-09-11 ENCOUNTER — Encounter (HOSPITAL_COMMUNITY): Payer: Self-pay | Admitting: Emergency Medicine

## 2023-09-11 ENCOUNTER — Encounter: Payer: Self-pay | Admitting: Obstetrics and Gynecology

## 2023-09-11 DIAGNOSIS — F319 Bipolar disorder, unspecified: Secondary | ICD-10-CM | POA: Diagnosis not present

## 2023-09-11 DIAGNOSIS — Z3A01 Less than 8 weeks gestation of pregnancy: Secondary | ICD-10-CM | POA: Diagnosis not present

## 2023-09-11 DIAGNOSIS — O210 Mild hyperemesis gravidarum: Secondary | ICD-10-CM

## 2023-09-11 DIAGNOSIS — R109 Unspecified abdominal pain: Secondary | ICD-10-CM | POA: Diagnosis not present

## 2023-09-11 DIAGNOSIS — O26891 Other specified pregnancy related conditions, first trimester: Secondary | ICD-10-CM | POA: Diagnosis present

## 2023-09-11 DIAGNOSIS — O99341 Other mental disorders complicating pregnancy, first trimester: Secondary | ICD-10-CM | POA: Diagnosis not present

## 2023-09-11 LAB — LIPASE, BLOOD: Lipase: 21 U/L (ref 11–51)

## 2023-09-11 LAB — URINALYSIS, ROUTINE W REFLEX MICROSCOPIC
Bilirubin Urine: NEGATIVE
Glucose, UA: NEGATIVE mg/dL
Hgb urine dipstick: NEGATIVE
Ketones, ur: 80 mg/dL — AB
Leukocytes,Ua: NEGATIVE
Nitrite: NEGATIVE
Protein, ur: 30 mg/dL — AB
Specific Gravity, Urine: 1.025 (ref 1.005–1.030)
pH: 5 (ref 5.0–8.0)

## 2023-09-11 LAB — COMPREHENSIVE METABOLIC PANEL
ALT: 20 U/L (ref 0–44)
AST: 21 U/L (ref 15–41)
Albumin: 4.7 g/dL (ref 3.5–5.0)
Alkaline Phosphatase: 63 U/L (ref 38–126)
Anion gap: 13 (ref 5–15)
BUN: 11 mg/dL (ref 6–20)
CO2: 20 mmol/L — ABNORMAL LOW (ref 22–32)
Calcium: 9.4 mg/dL (ref 8.9–10.3)
Chloride: 100 mmol/L (ref 98–111)
Creatinine, Ser: 0.61 mg/dL (ref 0.44–1.00)
GFR, Estimated: >60 mL/min (ref >60)
Glucose, Bld: 95 mg/dL (ref 70–99)
Potassium: 4 mmol/L (ref 3.5–5.1)
Sodium: 133 mmol/L — ABNORMAL LOW (ref 135–145)
Total Bilirubin: 0.5 mg/dL (ref 0.0–1.2)
Total Protein: 9.1 g/dL — ABNORMAL HIGH (ref 6.5–8.1)

## 2023-09-11 LAB — RAPID URINE DRUG SCREEN, HOSP PERFORMED
Amphetamines: NOT DETECTED
Barbiturates: NOT DETECTED
Benzodiazepines: NOT DETECTED
Cocaine: NOT DETECTED
Opiates: NOT DETECTED
Tetrahydrocannabinol: POSITIVE — AB

## 2023-09-11 LAB — HCG, SERUM, QUALITATIVE: Preg, Serum: POSITIVE — AB

## 2023-09-11 LAB — HCG, QUANTITATIVE, PREGNANCY: hCG, Beta Chain, Quant, S: 65041 m[IU]/mL — ABNORMAL HIGH (ref <5)

## 2023-09-11 MED ORDER — ONDANSETRON HCL 4 MG/2ML IJ SOLN
4.0000 mg | Freq: Once | INTRAMUSCULAR | Status: AC
Start: 2023-09-11 — End: 2023-09-11
  Administered 2023-09-11: 4 mg via INTRAVENOUS
  Filled 2023-09-11: qty 2

## 2023-09-11 MED ORDER — HALOPERIDOL LACTATE 5 MG/ML IJ SOLN
5.0000 mg | Freq: Once | INTRAMUSCULAR | Status: AC
  Administered 2023-09-11: 5 mg via INTRAVENOUS
  Filled 2023-09-11: qty 1

## 2023-09-11 MED ORDER — SODIUM CHLORIDE 0.9 % IV BOLUS
1000.0000 mL | Freq: Once | INTRAVENOUS | Status: AC
  Administered 2023-09-11: 1000 mL via INTRAVENOUS

## 2023-09-11 MED ORDER — LACTATED RINGERS IV SOLN: Freq: Once | INTRAVENOUS | Status: AC

## 2023-09-11 MED ORDER — DIPHENHYDRAMINE HCL 50 MG PO TABS: 25.0000 mg | ORAL_TABLET | Freq: Four times a day (QID) | ORAL | 0 refills | Status: DC | PRN

## 2023-09-11 MED ORDER — PROMETHAZINE HCL 25 MG RE SUPP: 25.0000 mg | Freq: Four times a day (QID) | RECTAL | 0 refills | Status: DC | PRN

## 2023-09-11 MED ORDER — ONDANSETRON HCL 4 MG/2ML IJ SOLN
4.0000 mg | Freq: Once | INTRAMUSCULAR | Status: AC
  Administered 2023-09-11: 4 mg via INTRAVENOUS
  Filled 2023-09-11: qty 2

## 2023-09-11 MED ORDER — METOCLOPRAMIDE HCL 5 MG/ML IJ SOLN
10.0000 mg | Freq: Once | INTRAMUSCULAR | Status: AC
  Administered 2023-09-11: 10 mg via INTRAVENOUS
  Filled 2023-09-11: qty 2

## 2023-09-11 MED ORDER — SODIUM CHLORIDE 0.9 % IV SOLN
12.5000 mg | Freq: Once | INTRAVENOUS | Status: AC
  Administered 2023-09-11: 12.5 mg via INTRAVENOUS
  Filled 2023-09-11: qty 12.5

## 2023-09-11 MED ORDER — DIPHENHYDRAMINE HCL 50 MG/ML IJ SOLN
25.0000 mg | Freq: Once | INTRAMUSCULAR | Status: DC
Start: 2023-09-11 — End: 2023-09-11

## 2023-09-11 NOTE — MAU Provider Note (Addendum)
Chief Complaint: Abdominal Pain   Event Date/Time   First Provider Initiated Contact with Patient 09/11/23 [phone]        SUBJECTIVE HPI: Melanie Hancock is a 28 y.o. [id number] at [redacted]w[redacted]d by LMP who presents [facility] to maternity admissions reporting nausea, vomting and abdominal pain.  Seen here by me on Monday night/Tuesday morning and verified IUP.  Had some N/V and was treated with meds and able to eat and drink.  Given prescriptions for home use Presented to [facility] saying meds had not worked.  They gave her one liter of fluid, Phenergan and Reglan and transferred her here for "admission"  Per Dr Jolayne Panther, plan to hydrate more with hopes of discharge home.   Patient denies marijuana use to me, but tells RN she used it 3-4 days ago. .  Patient is VERY difficult to arounse (Phenergan given 3 hours ago).  Answers with slurred speech intermittently.  At times will not respond at all.    HPI  Past Medical History:  Diagnosis Date   Anemia    Anxiety    Asthma    Bipolar disorder (HCC)    Blood transfusion without reported diagnosis    Chronic back pain    Depression    GERD (gastroesophageal reflux disease)    Headache    Post partum depression    Pyelonephritis    UTI (lower urinary tract infection)    Past Surgical History:  Procedure Laterality Date   DILATION AND CURETTAGE OF UTERUS     INDUCED ABORTION     LUMBAR LAMINECTOMY/DECOMPRESSION MICRODISCECTOMY Bilateral 06/27/2023   Procedure: L5-S1 DECOMPRESSION WITH BILATERAL FORAMINOTOMIES;  Surgeon: Venita Lick, MD;  Location: [facility] OR;  Service: Orthopedics;  Laterality: Bilateral;  2.5 hrs 3 C-Bed   WISDOM TOOTH EXTRACTION     Social History   Socioeconomic History   Marital status: Single    Spouse name: Not on file   Number of children: 4   Years of education: Not on file   Highest education level: Associate degree: occupational, Scientist, product/process development, or vocational program  Occupational History    Employer: [employer]   Tobacco Use   Smoking status: Every Day    Types: E-cigarettes   Smokeless tobacco: Never  Vaping Use   Vaping status: Every Day   Substances: Nicotine, Flavoring  Substance and Sexual Activity   Alcohol use: Yes    Comment: maybe 1-2 drinks every other weekend   Drug use: Yes    Types: Marijuana    Comment: every day   Sexual activity: Yes    Birth control/protection: None  Other Topics Concern   Not on file  Social History Narrative   Not on file   Social Drivers of Health   Financial Resource Strain: Not on file  Food Insecurity: No Food Insecurity (06/28/2023)   Hunger Vital Sign    Worried About Running Out of Food in the Last Year: Never true    Ran Out of Food in the Last Year: Never true  Transportation Needs: No Transportation Needs (06/28/2023)   PRAPARE - Administrator, Civil Service (Medical): No    Lack of Transportation (Non-Medical): No  Physical Activity: Not on file  Stress: Not on file  Social Connections: Unknown (08/07/2022)   Received from [facility]   Social Network    Social Network: Not on file  Intimate Partner Violence: Not At Risk (06/28/2023)   Humiliation, Afraid, Rape, and Kick questionnaire  Fear of Current or Ex-Partner: No    Emotionally Abused: No    Physically Abused: No    Sexually Abused: No   No current facility-administered medications on file prior to encounter.   Current Outpatient Medications on File Prior to Encounter  Medication Sig Dispense Refill   bisacodyl (DULCOLAX) 10 MG suppository Place 1 suppository (10 mg total) rectally as needed for moderate constipation. 12 suppository 0   fluticasone (FLONASE) 50 MCG/ACT nasal spray Place 2 sprays into both nostrils daily as needed for allergies.     gabapentin (NEURONTIN) 300 MG capsule Take 1 capsule (300 mg total) by mouth 3 (three) times daily as needed for up to 14 days. 42 capsule 0   ondansetron (ZOFRAN-ODT) 4 MG disintegrating tablet  Take 1 tablet (4 mg total) by mouth every 6 (six) hours as needed for nausea. 20 tablet 0   promethazine (PHENERGAN) 25 MG tablet Take 1 tablet (25 mg total) by mouth every 6 (six) hours as needed for nausea or vomiting. 30 tablet 2   risperiDONE (RISPERDAL) 0.5 MG tablet Take 0.5 mg by mouth at bedtime as needed (depression).     scopolamine (TRANSDERM-SCOP) 1 MG/3DAYS Place 1 patch (1.5 mg total) onto the skin every 3 (three) days. 10 patch 0   [DISCONTINUED] pantoprazole (PROTONIX) 20 MG tablet Take 1 tablet (20 mg total) by mouth daily. 30 tablet 2   Allergies  Allergen Reactions   Iodine     Pt states it burns and itches     Latex Itching   Mushroom Extract Complex (Do Not Select)     Throat itches     I have reviewed patient's Past Medical Hx, Surgical Hx, Family Hx, Social Hx, medications and allergies.   ROS:  Review of Systems  Reason unable to perform ROS: Level 5 caveat due to extreme somnolence.   Review of Systems  Other systems negative   Physical Exam  Physical Exam Patient Vitals for the past 24 hrs:  BP Temp Temp src Pulse Resp SpO2 Height Weight  09/11/23 0515 119/67 -- -- 77 -- 100 % -- --  09/11/23 0500 114/61 -- -- 79 -- 100 % -- --  09/11/23 0445 132/71 -- -- 73 -- 100 % -- --  09/11/23 0430 124/61 -- -- 70 -- 100 % -- --  09/11/23 0415 131/81 -- -- 70 -- 100 % -- --  09/11/23 0400 (!) 131/93 -- -- 65 18 100 % -- --  09/11/23 0345 104/69 -- -- 72 -- 100 % -- --  09/11/23 0330 131/78 -- -- (!) 52 -- 100 % -- --  09/11/23 0301 -- 98 F (36.7 C) Oral (!) 54 -- 100 % -- --  09/11/23 0245 (!) 130/92 -- -- 61 16 100 % -- --  09/11/23 0230 115/75 -- -- 70 -- 100 % -- --  09/11/23 0215 127/78 -- -- 63 -- 100 % -- --  09/11/23 0200 133/85 -- Oral (!) 53 16 100 % -- --  09/11/23 0145 121/74 -- -- (!) 53 -- 100 % -- --  09/10/23 2316 112/70 98 F (36.7 C) -- (!) 58 16 99 % -- --  09/10/23 2312 -- -- -- -- -- -- 5\' 5"  (1.651 m) 80 kg   Constitutional:  Well-developed, well-nourished female in no acute distress.  Cardiovascular: normal rate Respiratory: normal effort GI: Abd soft, non-tender.  MS: Extremities nontender, no edema, normal ROM Neurologic: Alert and oriented x 4.  GU: Neg CVAT.  LAB RESULTS Results for orders placed or performed during the hospital encounter of 09/10/23 (from the past 24 hours)  Lipase, blood     Status: None   Collection Time: 09/10/23 11:24 PM  Result Value Ref Range   Lipase 21 11 - 51 U/L  Comprehensive metabolic panel     Status: Abnormal   Collection Time: 09/10/23 11:24 PM  Result Value Ref Range   Sodium 133 (L) 135 - 145 mmol/L   Potassium 4.0 3.5 - 5.1 mmol/L   Chloride 100 98 - 111 mmol/L   CO2 20 (L) 22 - 32 mmol/L   Glucose, Bld 95 70 - 99 mg/dL   BUN 11 6 - 20 mg/dL   Creatinine, Ser 1.61 0.44 - 1.00 mg/dL   Calcium 9.4 8.9 - 09.6 mg/dL   Total Protein 9.1 (H) 6.5 - 8.1 g/dL   Albumin 4.7 3.5 - 5.0 g/dL   AST 21 15 - 41 U/L   ALT 20 0 - 44 U/L   Alkaline Phosphatase 63 38 - 126 U/L   Total Bilirubin 0.5 0.0 - 1.2 mg/dL   GFR, Estimated >04 >54 mL/min   Anion gap 13 5 - 15  CBC     Status: Abnormal   Collection Time: 09/10/23 11:24 PM  Result Value Ref Range   WBC 6.7 4.0 - 10.5 K/uL   RBC 4.71 3.87 - 5.11 MIL/uL   Hemoglobin 10.5 (L) 12.0 - 15.0 g/dL   HCT 09.8 (L) 11.9 - 14.7 %   MCV 73.7 (L) 80.0 - 100.0 fL   MCH 22.3 (L) 26.0 - 34.0 pg   MCHC 30.3 30.0 - 36.0 g/dL   RDW 82.9 (H) 56.2 - 13.0 %   Platelets 531 (H) 150 - 400 K/uL   nRBC 0.0 0.0 - 0.2 %  hCG, serum, qualitative     Status: Abnormal   Collection Time: 09/10/23 11:24 PM  Result Value Ref Range   Preg, Serum POSITIVE (A) NEGATIVE  hCG, quantitative, pregnancy     Status: Abnormal   Collection Time: 09/10/23 11:24 PM  Result Value Ref Range   hCG, Beta Chain, Quant, S 65,041 (H) <5 mIU/mL   Urine drug pending (ordered due to extreme somnolence)    IMAGING US done 09/09/23 showed live single IUP at  [redacted]w[redacted]d   MAU Management/MDM: I have reviewed the triage vital signs and the nursing notes.   Pertinent labs & imaging results that were available during my care of the patient were reviewed by me and considered in my medical decision making (see chart for details).      I have reviewed her medical records including past results, notes and treatments. Medical, Surgical, and family history were reviewed.  Medications and recent lab tests were reviewed  Ordered one additional liter of IV fluids Will hold further medications in light of somnolence and absence of vomiting. Patient states has vomited here since arrival but bag is empty.    ASSESSMENT Single IUP at [redacted]w[redacted]d Hyperemesis, possible Cannaboid type  PLAN   Wynelle Bourgeois CNM, MSN Certified Nurse-Midwife 09/11/2023  6:21 AM  Assumed care at [phone] of this patient.  Labwork reassuring. UA noninfectious. UDS positive for THC. Went to bedside, pt laying in bed still.  Partner at bedside.  Pt has not vomited and been sleeping since arrival.  Discussed that cannabis is in part responsible for her symptoms and suggested cannabis cessation.  Offered benadryl prescription but partner states they have some at home and was  counseled on 25mg  Q4-6hrs. Both pt and partner reassured that labwork looks reassuring otherwise. Pt with prior plans to terminate the pregnancy.  Sending out with Phenergan suppositories as well as benadryl in case they change their minds on the benadryl prescription.  Pt has previously ordered Zofran, PO Phenergan, and Scopolamine. Stable for d/c.   As patient was being discharge with nursing they witnessed a vomitus episode.  Pt and partner state they would just like a dose of medication prior to d/c to make it to the pharmacy and back home where her other medications are.  I placed order for IV Zofran and Haldol given cannabis concerns. Pt was stable and reported cessation of nausea at reassessment, stable for d/c.   Mittie Bodo,  MD Family Medicine - Obstetrics Fellow

## 2023-09-11 NOTE — Plan of Care (Addendum)
Melanie Hancock is a 28 y.o. female with medical history significant of past medical history of lumbar decompression and lumbar disc herniation status post repair in November 2024, chronic pain syndrome, asthma, GERD, bipolar disorder, and depression presented emergency department complaining of abdominal pain, nausea vomiting for last 1 week.  Patient reported that she was seen at the [facility] yesterday discharged home with scopolamine and Zofran without much improvement.   At presentation to ED patient is hemodynamically stable. Pregnancy test positive with elevated beta-hCG 65,000. CBC unremarkable stable H&H. CMP showing low sodium 133, low bicarb 22 otherwise unremarkable. Lipase 21. Pending UA.  Per chart review patient was admitted and discharged from the [facility] for the management of abdominal pain associate nausea vomiting.  Per discussion with ED physician it is very relevant that patient has hyperemesis gravidarum.  This is not a case of a small bowel obstruction. I have requested ED physician to reach out to OB/GYN to request for admission under their service for the management of hyperemesis gravidarum.  It would be more appropriate if patient is carried under OB/GYN as she is in her first trimester and there is no other medical reason to admit this patient under medicine service other than hyperemesis gravidarum. [facility] hospitalist will be available for any assistance.  Tereasa Coop, MD [facility] 09/11/2023, 4:49 AM

## 2023-09-11 NOTE — MAU Note (Signed)
Pt states she last used marijuana "3-4 days ago"

## 2023-09-11 NOTE — MAU Note (Signed)
.  Melanie Hancock is a 28 y.o. at [redacted]w[redacted]d here in MAU reporting: pt here by [facility] - transferred from [facility] ED. Reporting constant sharp/aching abdominal pain and N/V that has worsened since being d/c from MAU yesterday. States she is still nauseous after the meds she received at [facility] - pt not actively vomiting.   Pain score: 7    FHT: NA   Lab orders placed from triage: none

## 2023-09-12 ENCOUNTER — Other Ambulatory Visit (HOSPITAL_COMMUNITY): Payer: Self-pay

## 2023-09-12 ENCOUNTER — Encounter: Payer: Self-pay | Admitting: Hematology

## 2023-09-12 ENCOUNTER — Inpatient Hospital Stay (HOSPITAL_COMMUNITY)
Admission: AD | Admit: 2023-09-12 | Discharge: 2023-09-12 | Disposition: A | Discharge status: Home / Self Care | Payer: Medicaid Other | Attending: Obstetrics & Gynecology | Admitting: Obstetrics & Gynecology

## 2023-09-12 DIAGNOSIS — Z3A01 Less than 8 weeks gestation of pregnancy: Secondary | ICD-10-CM

## 2023-09-12 DIAGNOSIS — R112 Nausea with vomiting, unspecified: Secondary | ICD-10-CM

## 2023-09-12 DIAGNOSIS — O99331 Smoking (tobacco) complicating pregnancy, first trimester: Secondary | ICD-10-CM | POA: Diagnosis not present

## 2023-09-12 DIAGNOSIS — O26891 Other specified pregnancy related conditions, first trimester: Secondary | ICD-10-CM | POA: Diagnosis present

## 2023-09-12 DIAGNOSIS — O210 Mild hyperemesis gravidarum: Secondary | ICD-10-CM | POA: Diagnosis not present

## 2023-09-12 DIAGNOSIS — R1110 Vomiting, unspecified: Secondary | ICD-10-CM | POA: Diagnosis not present

## 2023-09-12 DIAGNOSIS — Z3A08 8 weeks gestation of pregnancy: Secondary | ICD-10-CM | POA: Insufficient documentation

## 2023-09-12 LAB — URINALYSIS, ROUTINE W REFLEX MICROSCOPIC
Bilirubin Urine: NEGATIVE
Glucose, UA: NEGATIVE mg/dL
Hgb urine dipstick: NEGATIVE
Ketones, ur: 80 mg/dL — AB
Leukocytes,Ua: NEGATIVE
Nitrite: NEGATIVE
Protein, ur: 30 mg/dL — AB
Specific Gravity, Urine: 1.028 (ref 1.005–1.030)
pH: 6 (ref 5.0–8.0)

## 2023-09-12 MED ORDER — METHYLPREDNISOLONE 4 MG PO TBPK: ORAL_TABLET | ORAL | 0 refills | Status: DC

## 2023-09-12 MED ORDER — PROMETHAZINE HCL 25 MG RE SUPP: 25.0000 mg | Freq: Four times a day (QID) | RECTAL | 1 refills | Status: DC | PRN

## 2023-09-12 MED ORDER — ONDANSETRON 4 MG PO TBDP: 4.0000 mg | ORAL_TABLET | Freq: Once | ORAL | Status: DC

## 2023-09-12 MED ORDER — SCOPOLAMINE 1 MG/3DAYS TD PT72
1.0000 | MEDICATED_PATCH | TRANSDERMAL | 1 refills | Status: DC
  Filled 2023-09-12: qty 10, 30d supply, fill #0

## 2023-09-12 MED ORDER — METHYLPREDNISOLONE SODIUM SUCC 125 MG IJ SOLR
125.0000 mg | Freq: Once | INTRAMUSCULAR | Status: AC
  Administered 2023-09-12: 125 mg via INTRAVENOUS
  Filled 2023-09-12: qty 2

## 2023-09-12 MED ORDER — LACTATED RINGERS IV BOLUS
1000.0000 mL | Freq: Once | INTRAVENOUS | Status: AC
  Administered 2023-09-12: 1000 mL via INTRAVENOUS

## 2023-09-12 MED ORDER — ONDANSETRON 8 MG PO TBDP
8.0000 mg | ORAL_TABLET | Freq: Three times a day (TID) | ORAL | 0 refills | Status: DC | PRN
  Filled 2023-09-12: qty 20, 7d supply, fill #0

## 2023-09-12 MED ORDER — METOCLOPRAMIDE HCL 10 MG PO TABS
10.0000 mg | ORAL_TABLET | Freq: Four times a day (QID) | ORAL | 0 refills | Status: DC
  Filled 2023-09-12: qty 30, 8d supply, fill #0

## 2023-09-12 MED ORDER — ONDANSETRON HCL 4 MG/2ML IJ SOLN
4.0000 mg | Freq: Once | INTRAMUSCULAR | Status: AC
  Administered 2023-09-12: 4 mg via INTRAVENOUS
  Filled 2023-09-12: qty 2

## 2023-09-12 MED ORDER — PROMETHAZINE HCL 25 MG RE SUPP
25.0000 mg | Freq: Four times a day (QID) | RECTAL | 0 refills | Status: DC | PRN
  Filled 2023-09-12: qty 12, 3d supply, fill #0

## 2023-09-12 NOTE — MAU Note (Signed)
.  Melanie Hancock is a 27 y.o. at [redacted]w[redacted]d here in MAU reporting: on-going nausea and vomiting. She has tried the zofran and phenergan at home with little relief. She reports seeing some red emesis yesterday - patient unsure if it was blood or not. She reports 2 episodes of emesis this AM - did not notice any red emesis this AM. She is also having left lower abdominal pain that is aching and stabbing. Has not been tolerating PO liquids or fluids since last week. Denies vaginal bleeding or abnormal discharge. Denies recent cannabis use. No active vomiting in triage.   Seen in MAU yesterday for nausea, vomiting, and abdominal pain. Cannabis use. Responded well to Zofran and Haldol.   LMP: N/A Onset of complaint: on-going Pain score: 6/10 Vitals:   09/12/23 1352  BP: 121/68  Pulse: 61  Resp: 18  Temp: 98.1 F (36.7 C)  SpO2: 100%     FHT: Not indicated  Lab orders placed from triage: UA

## 2023-09-12 NOTE — MAU Provider Note (Signed)
History     CSN: [id number]  Arrival date and time: 09/12/23 1312   Event Date/Time   First Provider Initiated Contact with Patient 09/12/23 1455      Chief Complaint  Patient presents with   Emesis   Nausea   Melanie Hancock , a  28 y.o. [id number] at [redacted]w[redacted]d presents to MAU with complaints of on-going nausea and vomiting. Patient recently seen in MAU for similar symptoms. She states that she has had at least 12 episodes of vomiting in the last 24 hours and 3 episodes today. She states that she has had nothing to eat or drink in 5 days. She states that she took Promethazine this morning at 11 with minimal relief and last took Zofran yesterday while in MAU. She states that her zofran "makes her throw up so hasn't taken today." She is wearing a scop patch. She states that she is having on-going upper left sided abdominal pain that she "dull sharp and achey." Denies attempting to relieve symptoms.  Reports last [facility] use was over the weekend. Patient requesting coke and ice.         OB History     Gravida  7   Para  4   Term  4   Preterm      AB  1   Living  4      SAB  0   IAB  1   Ectopic      Multiple  1   Live Births  4           Past Medical History:  Diagnosis Date   Anemia    Anxiety    Asthma    Bipolar disorder (HCC)    Blood transfusion without reported diagnosis    Chronic back pain    Depression    GERD (gastroesophageal reflux disease)    Headache    Post partum depression    Pyelonephritis    UTI (lower urinary tract infection)     Past Surgical History:  Procedure Laterality Date   DILATION AND CURETTAGE OF UTERUS     INDUCED ABORTION     LUMBAR LAMINECTOMY/DECOMPRESSION MICRODISCECTOMY Bilateral 06/27/2023   Procedure: L5-S1 DECOMPRESSION WITH BILATERAL FORAMINOTOMIES;  Surgeon: Venita Lick, MD;  Location: [facility] OR;  Service: Orthopedics;  Laterality: Bilateral;  2.5 hrs 3 C-Bed   WISDOM TOOTH EXTRACTION      Family History   Problem Relation Age of Onset   Arthritis Mother    Healthy Father    Arthritis Maternal Grandmother    Asthma Son    Alcohol abuse Neg Hx     Social History   Tobacco Use   Smoking status: Every Day    Types: E-cigarettes   Smokeless tobacco: Never  Vaping Use   Vaping status: Former   Substances: Nicotine, Flavoring  Substance Use Topics   Alcohol use: Not Currently    Comment: maybe 1-2 drinks every other weekend   Drug use: Yes    Types: Marijuana    Comment: every day    Allergies:  Allergies  Allergen Reactions   Iodine     Pt states it burns and itches     Latex Itching   Mushroom Extract Complex (Do Not Select)     Throat itches     Medications Prior to Admission  Medication Sig Dispense Refill Last Dose/Taking   ondansetron (ZOFRAN-ODT) 4 MG disintegrating tablet Take 1 tablet (4 mg total) by mouth every 6 (six) hours  as needed for nausea. 20 tablet 0 09/11/2023   promethazine (PHENERGAN) 25 MG suppository Place 1 suppository (25 mg total) rectally every 6 (six) hours as needed for nausea or vomiting. If cannot tolerate oral medication. 12 each 0 09/12/2023 at 11:57 AM   bisacodyl (DULCOLAX) 10 MG suppository Place 1 suppository (10 mg total) rectally as needed for moderate constipation. 12 suppository 0    diphenhydrAMINE (BENADRYL) 50 MG tablet Take 0.5 tablets (25 mg total) by mouth every 6 (six) hours as needed (nausea vomiting). 30 tablet 0    fluticasone (FLONASE) 50 MCG/ACT nasal spray Place 2 sprays into both nostrils daily as needed for allergies.      gabapentin (NEURONTIN) 300 MG capsule Take 1 capsule (300 mg total) by mouth 3 (three) times daily as needed for up to 14 days. 42 capsule 0    promethazine (PHENERGAN) 25 MG tablet Take 1 tablet (25 mg total) by mouth every 6 (six) hours as needed for nausea or vomiting. 30 tablet 2    risperiDONE (RISPERDAL) 0.5 MG tablet Take 0.5 mg by mouth at bedtime as needed (depression).      scopolamine  (TRANSDERM-SCOP) 1 MG/3DAYS Place 1 patch (1.5 mg total) onto the skin every 3 (three) days. 10 patch 0     Review of Systems  Constitutional:  Negative for chills, fatigue and fever.  Eyes:  Negative for pain and visual disturbance.  Respiratory:  Negative for apnea, shortness of breath and wheezing.   Cardiovascular:  Negative for chest pain and palpitations.  Gastrointestinal:  Positive for abdominal pain, constipation, nausea and vomiting. Negative for diarrhea.  Genitourinary:  Negative for difficulty urinating, dysuria, pelvic pain, vaginal bleeding, vaginal discharge and vaginal pain.  Musculoskeletal:  Negative for back pain.  Neurological:  Negative for seizures, weakness and headaches.  Psychiatric/Behavioral:  Negative for suicidal ideas.    Physical Exam   Blood pressure 129/65, pulse (!) 55, temperature 98.1 F (36.7 C), temperature source Oral, resp. rate 18, height 5\' 5"  (1.651 m), last menstrual period 07/30/2023, SpO2 100%, unknown if currently breastfeeding.  Physical Exam Vitals and nursing note reviewed.  Constitutional:      General: She is not in acute distress.    Appearance: Normal appearance.     Comments: Covers over top of patient head   HENT:     Head: Normocephalic.  Pulmonary:     Effort: Pulmonary effort is normal.  Abdominal:     Palpations: Abdomen is soft.     Tenderness: There is no abdominal tenderness. There is guarding. There is no right CVA tenderness.  Musculoskeletal:     Cervical back: Normal range of motion.  Skin:    General: Skin is warm and dry.  Neurological:     Mental Status: She is alert and oriented to person, place, and time.  Psychiatric:        Mood and Affect: Mood normal.     MAU Course  Procedures Orders Placed This Encounter  Procedures   Urinalysis, Routine w reflex microscopic -Urine, Clean Catch   meds to beds pharmacy consult ([facility] ONLY)   Insert peripheral IV   Meds ordered this encounter   Medications   promethazine (PHENERGAN) 25 MG suppository    Sig: Place 1 suppository (25 mg total) rectally every 6 (six) hours as needed for nausea or vomiting.    Dispense:  12 each    Refill:  0    Supervising Provider:   Samara Snide  ondansetron (ZOFRAN-ODT) 8 MG disintegrating tablet    Sig: Take 1 tablet (8 mg total) by mouth every 8 (eight) hours as needed for nausea or vomiting.    Dispense:  20 tablet    Refill:  0    Supervising Provider:   Reva Bores [[id number]]   scopolamine (TRANSDERM-SCOP) 1 MG/3DAYS    Sig: Place 1 patch (1.5 mg total) onto the skin every 3 (three) days.    Dispense:  10 patch    Refill:  1    Supervising Provider:   Reva Bores [[id number]]   promethazine (PHENERGAN) 25 MG suppository    Sig: Place 1 suppository (25 mg total) rectally every 6 (six) hours as needed for nausea or vomiting. If cannot tolerate oral medication.    Dispense:  12 each    Refill:  1    Supervising Provider:   Reva Bores [[id number]]   metoCLOPramide (REGLAN) 10 MG tablet    Sig: Take 1 tablet (10 mg total) by mouth every 6 (six) hours.    Dispense:  30 tablet    Refill:  0    Supervising Provider:   Reva Bores [[id number]]   DISCONTD: ondansetron (ZOFRAN-ODT) disintegrating tablet 4 mg   lactated ringers bolus 1,000 mL   ondansetron (ZOFRAN) injection 4 mg   methylPREDNISolone sodium succinate (SOLU-MEDROL) 125 mg/2 mL injection 125 mg   methylPREDNISolone (MEDROL DOSEPAK) 4 MG TBPK tablet    Sig: Take as prescribed    Dispense:  21 tablet    Refill:  0    Supervising Provider:   Reva Bores [[id number]]     MDM UA unchanged from yesterday, but no worse. No episodes of vomiting planning to treat with PO/rectal antiemetics.  - Patient called out and voiced that she had the urge to vomit.  - IV antiemetics and fluids ordered. IV solumedrol also ordered, plan to send home on solumedrol taper. Patient voiced that she has appt scheduled for termination and needs to just  make it to appt.  - Meds to beds consult to get patient home meds.  - @ 1800 patient attempting PO challenge.  - PO challenge successful  - plan for discharge    Assessment and Plan   1. Hyperemesis   2. Nausea and vomiting, unspecified vomiting type   3. [redacted] weeks gestation of pregnancy    - Reviewed worsening signs and return precautions.  - Recommended to take meds as prescribed, including Zofran. Rx for phenergan changed to suppository  - M2B in MAU today for all home meds except Solumedrol Dosing Pak. Sent to outpatient pharmacy  for pick up. Admin instructions given at patient bedside. - Patient has consultation and termination appt scheduled next week.  - Patient discharged home in stable condition and amy return to MAU as needed.    Claudette Head, MSN CNM  09/12/2023, 2:55 PM

## 2023-09-16 ENCOUNTER — Encounter (HOSPITAL_COMMUNITY): Payer: Self-pay | Admitting: Obstetrics & Gynecology

## 2023-09-16 ENCOUNTER — Inpatient Hospital Stay (HOSPITAL_COMMUNITY)
Admission: AD | Admit: 2023-09-16 | Discharge: 2023-09-17 | Disposition: A | Discharge status: Home / Self Care | Payer: Medicaid Other | Attending: Obstetrics and Gynecology | Admitting: Obstetrics and Gynecology

## 2023-09-16 DIAGNOSIS — O26891 Other specified pregnancy related conditions, first trimester: Secondary | ICD-10-CM | POA: Insufficient documentation

## 2023-09-16 DIAGNOSIS — O211 Hyperemesis gravidarum with metabolic disturbance: Secondary | ICD-10-CM | POA: Diagnosis not present

## 2023-09-16 DIAGNOSIS — Z3A01 Less than 8 weeks gestation of pregnancy: Secondary | ICD-10-CM | POA: Insufficient documentation

## 2023-09-16 DIAGNOSIS — O210 Mild hyperemesis gravidarum: Secondary | ICD-10-CM | POA: Diagnosis present

## 2023-09-16 DIAGNOSIS — R1010 Upper abdominal pain, unspecified: Secondary | ICD-10-CM | POA: Insufficient documentation

## 2023-09-16 DIAGNOSIS — E860 Dehydration: Secondary | ICD-10-CM | POA: Insufficient documentation

## 2023-09-16 DIAGNOSIS — R1110 Vomiting, unspecified: Secondary | ICD-10-CM | POA: Diagnosis not present

## 2023-09-16 DIAGNOSIS — O26899 Other specified pregnancy related conditions, unspecified trimester: Secondary | ICD-10-CM

## 2023-09-16 LAB — URINALYSIS, ROUTINE W REFLEX MICROSCOPIC
Bilirubin Urine: NEGATIVE
Glucose, UA: NEGATIVE mg/dL
Hgb urine dipstick: NEGATIVE
Ketones, ur: 80 mg/dL — AB
Leukocytes,Ua: NEGATIVE
Nitrite: NEGATIVE
Protein, ur: 30 mg/dL — AB
Specific Gravity, Urine: 1.017 (ref 1.005–1.030)
pH: 7 (ref 5.0–8.0)

## 2023-09-16 LAB — CBC
HCT: 38.2 % (ref 36.0–46.0)
Hemoglobin: 12.2 g/dL (ref 12.0–15.0)
MCH: 22.8 pg — ABNORMAL LOW (ref 26.0–34.0)
MCHC: 31.9 g/dL (ref 30.0–36.0)
MCV: 71.3 fL — ABNORMAL LOW (ref 80.0–100.0)
Platelets: 444 10*3/uL — ABNORMAL HIGH (ref 150–400)
RBC: 5.36 MIL/uL — ABNORMAL HIGH (ref 3.87–5.11)
RDW: 16 % — ABNORMAL HIGH (ref 11.5–15.5)
WBC: 7.1 10*3/uL (ref 4.0–10.5)
nRBC: 0 % (ref 0.0–0.2)

## 2023-09-16 LAB — BASIC METABOLIC PANEL
Anion gap: 14 (ref 5–15)
BUN: 9 mg/dL (ref 6–20)
CO2: 24 mmol/L (ref 22–32)
Calcium: 9.5 mg/dL (ref 8.9–10.3)
Chloride: 95 mmol/L — ABNORMAL LOW (ref 98–111)
Creatinine, Ser: 0.76 mg/dL (ref 0.44–1.00)
GFR, Estimated: >60 mL/min (ref >60)
Glucose, Bld: 89 mg/dL (ref 70–99)
Potassium: 3 mmol/L — ABNORMAL LOW (ref 3.5–5.1)
Sodium: 133 mmol/L — ABNORMAL LOW (ref 135–145)

## 2023-09-16 MED ORDER — HALOPERIDOL LACTATE 5 MG/ML IJ SOLN
2.0000 mg | Freq: Four times a day (QID) | INTRAMUSCULAR | Status: DC | PRN
  Administered 2023-09-16: 2 mg via INTRAVENOUS

## 2023-09-16 MED ORDER — INFUVITE ADULT IV SOLN
Freq: Once | INTRAVENOUS | Status: AC
  Filled 2023-09-16: qty 10

## 2023-09-16 MED ORDER — LACTATED RINGERS IV SOLN: INTRAVENOUS | Status: DC

## 2023-09-16 MED ORDER — HALOPERIDOL LACTATE 5 MG/ML IJ SOLN
5.0000 mg | Freq: Once | INTRAMUSCULAR | Status: DC
  Filled 2023-09-16: qty 1

## 2023-09-16 NOTE — MAU Provider Note (Signed)
Chief Complaint: Nausea and Emesis   Event Date/Time   First Provider Initiated Contact with Patient 09/16/23 2100        SUBJECTIVE HPI: Melanie Hancock is a 28 y.o. [id number] at [redacted]w[redacted]d by LMP who presents to maternity admissions reporting recurrent nausea and vomiting.  Last took Phenergan at 1:30pm.  Has not taken Zofran, states she vomits it up (even though it is dissolvable).  Has been treated with multiple therapies for this.  Plans termination of pregnancy.   States has not urinated in 4 days.  States was able to eat a day or two ago but the two days before that, she slept "for 2 days straight".   She denies vaginal bleeding, Diarrhea or fever/chills.   She is very somnolent, does not open eyes.   Emesis  This is a recurrent problem. The problem has been unchanged. There has been no fever. Associated symptoms include abdominal pain (generalized abdominal cramping). Pertinent negatives include no chills, diarrhea or fever.   RN note: Melanie Hancock is a 28 y.o. at [redacted]w[redacted]d here in MAU via EMS reporting: nausea and vomiting for a week. Reports she has not been able to void for 4 days. Reports upper abdominal pain 10/10 and feels Achy.  Took Phenergan at 1330, but it did not help.   Pain score: 10/10  Past Medical History:  Diagnosis Date   Anemia    Anxiety    Asthma    Bipolar disorder (HCC)    Blood transfusion without reported diagnosis    Chronic back pain    Depression    GERD (gastroesophageal reflux disease)    Headache    Post partum depression    Pyelonephritis    UTI (lower urinary tract infection)    Past Surgical History:  Procedure Laterality Date   DILATION AND CURETTAGE OF UTERUS     INDUCED ABORTION     LUMBAR LAMINECTOMY/DECOMPRESSION MICRODISCECTOMY Bilateral 06/27/2023   Procedure: L5-S1 DECOMPRESSION WITH BILATERAL FORAMINOTOMIES;  Surgeon: Venita Lick, MD;  Location: [facility] OR;  Service: Orthopedics;  Laterality: Bilateral;  2.5 hrs 3 C-Bed   WISDOM TOOTH  EXTRACTION     Social History   Socioeconomic History   Marital status: Single    Spouse name: Not on file   Number of children: 4   Years of education: Not on file   Highest education level: Associate degree: occupational, Scientist, product/process development, or vocational program  Occupational History    Employer: [employer]  Tobacco Use   Smoking status: Every Day    Types: E-cigarettes   Smokeless tobacco: Never  Vaping Use   Vaping status: Former   Substances: Nicotine, Flavoring  Substance and Sexual Activity   Alcohol use: Not Currently    Comment: maybe 1-2 drinks every other weekend   Drug use: Yes    Types: Marijuana    Comment: every day   Sexual activity: Not Currently    Birth control/protection: None  Other Topics Concern   Not on file  Social History Narrative   Not on file   Social Drivers of Health   Financial Resource Strain: Not on file  Food Insecurity: No Food Insecurity (06/28/2023)   Hunger Vital Sign    Worried About Running Out of Food in the Last Year: Never true    Ran Out of Food in the Last Year: Never true  Transportation Needs: No Transportation Needs (06/28/2023)   PRAPARE - Administrator, Civil Service (Medical): No  Lack of Transportation (Non-Medical): No  Physical Activity: Not on file  Stress: Not on file  Social Connections: Unknown (08/07/2022)   Received from [facility], [facility]   Social Network    Social Network: Not on file  Intimate Partner Violence: Not At Risk (06/28/2023)   Humiliation, Afraid, Rape, and Kick questionnaire    Fear of Current or Ex-Partner: No    Emotionally Abused: No    Physically Abused: No    Sexually Abused: No   No current facility-administered medications on file prior to encounter.   Current Outpatient Medications on File Prior to Encounter  Medication Sig Dispense Refill   metoCLOPramide (REGLAN) 10 MG tablet Take 1 tablet (10 mg total) by mouth every 6 (six) hours. 30 tablet 0   promethazine  (PHENERGAN) 25 MG tablet Take 1 tablet (25 mg total) by mouth every 6 (six) hours as needed for nausea or vomiting. 30 tablet 2   scopolamine (TRANSDERM-SCOP) 1 MG/3DAYS Place 1 patch (1.5 mg total) onto the skin every 3 (three) days. 10 patch 0   bisacodyl (DULCOLAX) 10 MG suppository Place 1 suppository (10 mg total) rectally as needed for moderate constipation. 12 suppository 0   diphenhydrAMINE (BENADRYL) 50 MG tablet Take 0.5 tablets (25 mg total) by mouth every 6 (six) hours as needed (nausea vomiting). 30 tablet 0   fluticasone (FLONASE) 50 MCG/ACT nasal spray Place 2 sprays into both nostrils daily as needed for allergies.     gabapentin (NEURONTIN) 300 MG capsule Take 1 capsule (300 mg total) by mouth 3 (three) times daily as needed for up to 14 days. 42 capsule 0   methylPREDNISolone (MEDROL DOSEPAK) 4 MG TBPK tablet Take as prescribed 21 tablet 0   ondansetron (ZOFRAN-ODT) 4 MG disintegrating tablet Take 1 tablet (4 mg total) by mouth every 6 (six) hours as needed for nausea. 20 tablet 0   ondansetron (ZOFRAN-ODT) 8 MG disintegrating tablet Take 1 tablet (8 mg total) by mouth every 8 (eight) hours as needed for nausea or vomiting. 20 tablet 0   promethazine (PHENERGAN) 25 MG suppository Place 1 suppository (25 mg total) rectally every 6 (six) hours as needed for nausea or vomiting. 12 each 0   promethazine (PHENERGAN) 25 MG suppository Place 1 suppository (25 mg total) rectally every 6 (six) hours as needed for nausea or vomiting. If cannot tolerate oral medication. 12 each 1   risperiDONE (RISPERDAL) 0.5 MG tablet Take 0.5 mg by mouth at bedtime as needed (depression).     scopolamine (TRANSDERM-SCOP) 1 MG/3DAYS Place 1 patch (1.5 mg total) onto the skin every 3 (three) days. 10 patch 1   [DISCONTINUED] pantoprazole (PROTONIX) 20 MG tablet Take 1 tablet (20 mg total) by mouth daily. 30 tablet 2   Allergies  Allergen Reactions   Iodine     Pt states it burns and itches     Latex  Itching   Mushroom Extract Complex (Do Not Select)     Throat itches     I have reviewed patient's Past Medical Hx, Surgical Hx, Family Hx, Social Hx, medications and allergies.   ROS:  Review of Systems  Constitutional:  Negative for chills and fever.  Gastrointestinal:  Positive for abdominal pain (generalized abdominal cramping) and vomiting. Negative for diarrhea.   Review of Systems  Other systems negative   Physical Exam  Physical Exam Patient Vitals for the past 24 hrs:  BP Temp Temp src Pulse Resp SpO2  09/16/23 2038 112/61 98 F (36.7  C) Oral 96 20 100 %   Constitutional: Well-developed, well-nourished female in no acute distress.  Cardiovascular: normal rate Respiratory: normal effort GI: Abd soft, non-tender. MS: Extremities nontender, no edema, normal ROM Neurologic: Alert and oriented x 4.  GU: Neg CVAT.   LAB RESULTS Results for orders placed or performed during the hospital encounter of 09/16/23 (from the past 24 hours)  Urinalysis, Routine w reflex microscopic -Urine, Clean Catch     Status: Abnormal   Collection Time: 09/16/23  9:00 PM  Result Value Ref Range   Color, Urine YELLOW YELLOW   APPearance HAZY (A) CLEAR   Specific Gravity, Urine 1.017 1.005 - 1.030   pH 7.0 5.0 - 8.0   Glucose, UA NEGATIVE NEGATIVE mg/dL   Hgb urine dipstick NEGATIVE NEGATIVE   Bilirubin Urine NEGATIVE NEGATIVE   Ketones, ur 80 (A) NEGATIVE mg/dL   Protein, ur 30 (A) NEGATIVE mg/dL   Nitrite NEGATIVE NEGATIVE   Leukocytes,Ua NEGATIVE NEGATIVE   RBC / HPF 0-5 0 - 5 RBC/hpf   WBC, UA 0-5 0 - 5 WBC/hpf   Bacteria, UA FEW (A) NONE SEEN   Squamous Epithelial / HPF 6-10 0 - 5 /HPF   Mucus PRESENT   Basic metabolic panel     Status: Abnormal   Collection Time: 09/16/23  9:21 PM  Result Value Ref Range   Sodium 133 (L) 135 - 145 mmol/L   Potassium 3.0 (L) 3.5 - 5.1 mmol/L   Chloride 95 (L) 98 - 111 mmol/L   CO2 24 22 - 32 mmol/L   Glucose, Bld 89 70 - 99 mg/dL   BUN 9  6 - 20 mg/dL   Creatinine, Ser 0.86 0.44 - 1.00 mg/dL   Calcium 9.5 8.9 - 57.8 mg/dL   GFR, Estimated >46 >96 mL/min   Anion gap 14 5 - 15  CBC     Status: Abnormal   Collection Time: 09/16/23  9:21 PM  Result Value Ref Range   WBC 7.1 4.0 - 10.5 K/uL   RBC 5.36 (H) 3.87 - 5.11 MIL/uL   Hemoglobin 12.2 12.0 - 15.0 g/dL   HCT 29.5 28.4 - 13.2 %   MCV 71.3 (L) 80.0 - 100.0 fL   MCH 22.8 (L) 26.0 - 34.0 pg   MCHC 31.9 30.0 - 36.0 g/dL   RDW 44.0 (H) 10.2 - 72.5 %   Platelets 444 (H) 150 - 400 K/uL   nRBC 0.0 0.0 - 0.2 %      IMAGING   MAU Management/MDM: I have reviewed the triage vital signs and the nursing notes.   Pertinent labs & imaging results that were available during my care of the patient were reviewed by me and considered in my medical decision making (see chart for details).      I have reviewed her medical records including past results, notes and treatments. Medical, Surgical, and family history were reviewed.  Medications and recent lab tests were reviewed  Ordered metabolic labs.   UA and Creatinine are not consistent with history of no urination for 4 days. There is significant weight loss.  Orthostatic vital signs remarkable for increased heart rate with standing.   Treatments in MAU included One liter of IVF given with Haldol 2mg .  Patient states she might feel a little better.  Abdominal pain might be better.  Very somnolent, one word answers.   Last UDS positive for only MJ.  Denies any recent use Discussed Cannaboid Cyclic vomiting and pain can persist  even past last usage. .  Will administer another liter with D5LR and MVI due to orthostatic changes   Felt much better after second bag.  States this is the best she has felt in a long time  First set Orthostatic VS: HR 89 > 114 > 134 Repeat Orthostatic VS: HR 92 > 112> 116 Pt states is not dizzy, "just a little tired"  ASSESSMENT Single IUP at [redacted]w[redacted]d Hyperemesis with orthostatic changes, likely  Cannaboid Dehydration and weight loss  PLAN Discharge home Has multiple meds at home Advance diet as tolerated  Pt stable at time of discharge. Encouraged to return here if she develops worsening of symptoms, increase in pain, fever, or other concerning symptoms.    Wynelle Bourgeois CNM, MSN Certified Nurse-Midwife 09/16/2023  9:00 PM

## 2023-09-16 NOTE — MAU Note (Signed)
..  Melanie Hancock is a 28 y.o. at [redacted]w[redacted]d here in MAU via EMS reporting: nausea and vomiting for a week. Reports she has not been able to void for 4 days. Reports upper abdominal pain 10/10 and feels Achy.  Took Phenergan at 1330, but it did not help.    Pain score: 10/10 Vitals:   09/16/23 2038  BP: 112/61  Pulse: 96  Resp: 20  Temp: 98 F (36.7 C)  SpO2: 100%     FHT:n/a Lab orders placed from triage:  UA

## 2023-09-17 DIAGNOSIS — R109 Unspecified abdominal pain: Secondary | ICD-10-CM

## 2023-09-17 DIAGNOSIS — R1110 Vomiting, unspecified: Secondary | ICD-10-CM

## 2023-09-17 DIAGNOSIS — Z3A01 Less than 8 weeks gestation of pregnancy: Secondary | ICD-10-CM

## 2023-09-17 DIAGNOSIS — E860 Dehydration: Secondary | ICD-10-CM

## 2023-09-17 DIAGNOSIS — O26891 Other specified pregnancy related conditions, first trimester: Secondary | ICD-10-CM

## 2023-10-11 ENCOUNTER — Ambulatory Visit (HOSPITAL_COMMUNITY): Payer: Self-pay

## 2023-10-11 ENCOUNTER — Encounter (HOSPITAL_COMMUNITY): Payer: Self-pay

## 2023-10-11 ENCOUNTER — Ambulatory Visit (HOSPITAL_COMMUNITY)
Admission: RE | Admit: 2023-10-11 | Discharge: 2023-10-11 | Disposition: A | Discharge status: Home / Self Care | Payer: Medicaid Other | Source: Ambulatory Visit | Attending: Emergency Medicine | Admitting: Emergency Medicine

## 2023-10-11 VITALS — BP 105/70 | HR 88 | Temp 98.7°F | Resp 16

## 2023-10-11 DIAGNOSIS — N760 Acute vaginitis: Secondary | ICD-10-CM | POA: Diagnosis present

## 2023-10-11 LAB — POCT URINALYSIS DIP (MANUAL ENTRY)
Bilirubin, UA: NEGATIVE
Glucose, UA: NEGATIVE mg/dL
Ketones, POC UA: NEGATIVE mg/dL
Leukocytes, UA: NEGATIVE
Nitrite, UA: NEGATIVE
Protein Ur, POC: NEGATIVE mg/dL
Spec Grav, UA: >=1.03 — AB (ref 1.010–1.025)
Urobilinogen, UA: 0.2 U/dL
pH, UA: 6 (ref 5.0–8.0)

## 2023-10-11 MED ORDER — METRONIDAZOLE 500 MG PO TABS: 500.0000 mg | ORAL_TABLET | Freq: Two times a day (BID) | ORAL | 0 refills | Status: AC

## 2023-10-11 NOTE — ED Provider Notes (Signed)
[facility]    CSN: [id number] Arrival date & time: 10/11/23  1902    HISTORY   Chief Complaint  Patient presents with   Appointment   Urinary Tract Infection   SEXUALLY TRANSMITTED DISEASE   HPI Melanie Hancock is a pleasant, 28 y.o. female who presents to urgent care today. Patient requests STD screening. Patient endorses abnormal vaginal discharge, abnormal vaginal odor, vaginal itching, and burning during urination.   Patient denies rash, suprapubic pain, genital lesion , increased frequency of urination, flank pain, fever , body aches, chills, rigors, malaise, and significant fatigue.  Patient states she recently found out that her boyfriend has been having sex with other people.  States she had an abortion 2 weeks ago.  The history is provided by the patient.    Past Medical History:  Diagnosis Date   Anemia    Anxiety    Asthma    Bipolar disorder (HCC)    Blood transfusion without reported diagnosis    Chronic back pain    Depression    GERD (gastroesophageal reflux disease)    Headache    Post partum depression    Pyelonephritis    UTI (lower urinary tract infection)    Patient Active Problem List   Diagnosis Date Noted   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   History of gestational diabetes 09/10/2022   Gastroesophageal reflux disease 03/28/2020   Acute intractable headache 03/14/2020   Anemia 09/30/2019   History of postpartum depression, currently pregnant 01/23/2016   Iron deficiency anemia 01/02/2016   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   Herpes simplex 11/06/2015   Trichomoniasis 06/17/2015   Anxiety 03/28/2014   Chronic back pain 03/28/2014   Hx pyelonephritis 2014 05/26/2013   History of chlamydia 08/21/2007   Past Surgical History:  Procedure Laterality Date   DILATION AND CURETTAGE OF UTERUS     INDUCED ABORTION     LUMBAR LAMINECTOMY/DECOMPRESSION MICRODISCECTOMY Bilateral 06/27/2023   Procedure: L5-S1 DECOMPRESSION WITH BILATERAL  FORAMINOTOMIES;  Surgeon: Venita Lick, MD;  Location: [facility] OR;  Service: Orthopedics;  Laterality: Bilateral;  2.5 hrs 3 C-Bed   WISDOM TOOTH EXTRACTION     OB History     Gravida  7   Para  4   Term  4   Preterm      AB  1   Living  4      SAB  0   IAB  1   Ectopic      Multiple  1   Live Births  4          Home Medications    Prior to Admission medications   Medication Sig Start Date End Date Taking? Authorizing Provider  pantoprazole (PROTONIX) 20 MG tablet Take 1 tablet (20 mg total) by mouth daily. 09/17/19 11/12/19  Aviva Signs, CNM    Family History Family History  Problem Relation Age of Onset   Arthritis Mother    Healthy Father    Arthritis Maternal Grandmother    Asthma Son    Alcohol abuse Neg Hx    Social History Social History   Tobacco Use   Smoking status: Every Day    Types: E-cigarettes   Smokeless tobacco: Never  Vaping Use   Vaping status: Former   Substances: Nicotine, Flavoring  Substance Use Topics   Alcohol use: Not Currently    Comment: maybe 1-2 drinks every other weekend   Drug use: Yes    Types: Marijuana  Comment: every day   Allergies   Iodine, Latex, and Mushroom extract complex (obsolete)  Review of Systems Review of Systems Pertinent findings revealed after performing a 14 point review of systems has been noted in the history of present illness.  Physical Exam Vital Signs BP 105/70 (BP Location: Right Arm)   Pulse 88   Temp 98.7 F (37.1 C) (Oral)   Resp 16   LMP 07/30/2023 (Exact Date)   SpO2 98%   Breastfeeding No   No data found.  Physical Exam Vitals and nursing note reviewed.  Constitutional:      General: She is not in acute distress.    Appearance: Normal appearance. She is not ill-appearing.  HENT:     Head: Normocephalic and atraumatic.  Eyes:     General: Lids are normal.        Right eye: No discharge.        Left eye: No discharge.     Extraocular Movements:  Extraocular movements intact.     Conjunctiva/sclera: Conjunctivae normal.     Right eye: Right conjunctiva is not injected.     Left eye: Left conjunctiva is not injected.  Neck:     Trachea: Trachea and phonation normal.  Cardiovascular:     Rate and Rhythm: Normal rate and regular rhythm.     Pulses: Normal pulses.     Heart sounds: Normal heart sounds. No murmur heard.    No friction rub. No gallop.  Pulmonary:     Effort: Pulmonary effort is normal. No accessory muscle usage, prolonged expiration or respiratory distress.     Breath sounds: Normal breath sounds. No stridor, decreased air movement or transmitted upper airway sounds. No decreased breath sounds, wheezing, rhonchi or rales.  Chest:     Chest wall: No tenderness.  Genitourinary:    Comments: Patient politely declines pelvic exam today, patient provided a vaginal swab for testing. Musculoskeletal:        General: Normal range of motion.     Cervical back: Normal range of motion and neck supple. Normal range of motion.  Lymphadenopathy:     Cervical: No cervical adenopathy.  Skin:    General: Skin is warm and dry.     Findings: No erythema or rash.  Neurological:     General: No focal deficit present.     Mental Status: She is alert and oriented to person, place, and time.  Psychiatric:        Mood and Affect: Mood normal.        Behavior: Behavior normal.     Visual Acuity Right Eye Distance:   Left Eye Distance:   Bilateral Distance:    Right Eye Near:   Left Eye Near:    Bilateral Near:     UC Couse / Diagnostics / Procedures:     Radiology No results found.  Procedures Procedures (including critical care time) EKG  Pending results:  Labs Reviewed  POCT URINALYSIS DIP (MANUAL ENTRY) - Abnormal; Notable for the following components:      Result Value   Spec Grav, UA >=1.030 (*)    Blood, UA moderate (*)    All other components within normal limits  CERVICOVAGINAL ANCILLARY ONLY     Medications Ordered in UC: Medications - No data to display  UC Diagnoses / Final Clinical Impressions(s)   I have reviewed the triage vital signs and the nursing notes.  Pertinent labs & imaging results that were available during my care of  the patient were reviewed by me and considered in my medical decision making (see chart for details).    Final diagnoses:  Vaginitis and vulvovaginitis  Patient advised to follow-up with orthopedics for further evaluation of chronic shoulder pain. STD screening was performed, patient advised that the results be posted to their MyChart and if any of the results are positive, they will be notified by phone, further treatment will be provided as indicated based on results of STD screening. Patient was advised to abstain from sexual intercourse until that they receive the results of their STD testing.  Patient was also advised to use condoms to protect themselves from STD exposure. Urinalysis today was normal. Return precautions advised.  Drug allergies reviewed, all questions addressed.   Please see discharge instructions below for details of plan of care as provided to patient. ED Prescriptions   None    PDMP not reviewed this encounter.  Disposition Upon Discharge:  Condition: stable for discharge home  Patient presented with concern for an acute illness with associated systemic symptoms and significant discomfort requiring urgent management. In my opinion, this is a condition that a prudent lay person (someone who possesses an average knowledge of health and medicine) may potentially expect to result in complications if not addressed urgently such as respiratory distress, impairment of bodily function or dysfunction of bodily organs.   As such, the patient has been evaluated and assessed, work-up was performed and treatment was provided in alignment with urgent care protocols and evidence based medicine.  Patient/parent/caregiver has been  advised that the patient may require follow up for further testing and/or treatment if the symptoms continue in spite of treatment, as clinically indicated and appropriate.  Routine symptom specific, illness specific and/or disease specific instructions were discussed with the patient and/or caregiver at length.  Prevention strategies for avoiding STD exposure were also discussed.  The patient will follow up with their current PCP if and as advised. If the patient does not currently have a PCP we will assist them in obtaining one.   The patient may need specialty follow up if the symptoms continue, in spite of conservative treatment and management, for further workup, evaluation, consultation and treatment as clinically indicated and appropriate.  Patient/parent/caregiver verbalized understanding and agreement of plan as discussed.  All questions were addressed during visit.  Please see discharge instructions below for further details of plan.    Discharge Instructions      The results of your vaginal swab test which screens for BV, yeast, gonorrhea, chlamydia and trichomonas will be posted to your MyChart account once it is complete.  This typically takes 2 to 4 days.  Please abstain from sexual intercourse of any kind, vaginal, oral or anal, until you have received the results of your STD testing.      If any of your results are abnormal, you will receive a phone call regarding treatment.  Prescriptions, if any are needed, will be provided for you at your pharmacy.      Our point-of-care analysis of your urine sample today was normal and did not reveal any concern for urinary tract infection.     If you have not had complete resolution of your symptoms after completing any recommended treatment or if your symptoms worsen, please return for repeat evaluation.     Thank you for visiting [facility] today.  We appreciate the opportunity to participate in your care.       This  office note has  been dictated using Teaching laboratory technician.  Unfortunately, this method of dictation can sometimes lead to typographical or grammatical errors.  I apologize for your inconvenience in advance if this occurs.  Please do not hesitate to reach out to me if clarification is needed.       Theadora Rama Scales, [place] 10/11/23 2044

## 2023-10-11 NOTE — Discharge Instructions (Signed)
The results of your vaginal swab test which screens for BV, yeast, gonorrhea, chlamydia and trichomonas will be posted to your MyChart account once it is complete.  This typically takes 2 to 4 days.  Please abstain from sexual intercourse of any kind, vaginal, oral or anal, until you have received the results of your STD testing.      If any of your results are abnormal, you will receive a phone call regarding treatment.  Prescriptions, if any are needed, will be provided for you at your pharmacy.      Our point-of-care analysis of your urine sample today was normal and did not reveal any concern for urinary tract infection.     If you have not had complete resolution of your symptoms after completing any recommended treatment or if your symptoms worsen, please return for repeat evaluation.     Thank you for visiting [facility] today.  We appreciate the opportunity to participate in your care.

## 2023-10-11 NOTE — ED Triage Notes (Signed)
Chief Complaint: Patient having vaginal irritation. States her current partner has been sleeping with other people. Patient also had an abortion 2 weeks ago. Patient also having right side shoulder pain for the last month.   Sick exposure: No  Onset: Vaginal symptoms for 3-4 days, shoulder pain 1 month  Prescriptions or OTC medications tried: Yes- oxycodone     with significant relief  New foods, medications, or products: No  Recent Travel: No

## 2023-10-16 LAB — CERVICOVAGINAL ANCILLARY ONLY
Bacterial Vaginitis (gardnerella): POSITIVE — AB
Candida Glabrata: NEGATIVE
Candida Vaginitis: NEGATIVE
Chlamydia: NEGATIVE
Comment: NEGATIVE
Comment: NEGATIVE
Comment: NEGATIVE
Comment: NEGATIVE
Comment: NEGATIVE
Comment: NORMAL
Neisseria Gonorrhea: NEGATIVE
Trichomonas: NEGATIVE

## 2023-11-27 ENCOUNTER — Ambulatory Visit
Admission: RE | Admit: 2023-11-27 | Discharge: 2023-11-27 | Disposition: A | Discharge status: Home / Self Care | Source: Ambulatory Visit | Attending: Physician Assistant | Admitting: Physician Assistant

## 2023-11-27 ENCOUNTER — Other Ambulatory Visit: Payer: Self-pay | Admitting: Physician Assistant

## 2023-11-27 DIAGNOSIS — M79642 Pain in left hand: Secondary | ICD-10-CM

## 2023-11-27 DIAGNOSIS — M25511 Pain in right shoulder: Secondary | ICD-10-CM

## 2023-11-27 DIAGNOSIS — M79641 Pain in right hand: Secondary | ICD-10-CM

## 2024-02-07 ENCOUNTER — Other Ambulatory Visit: Payer: Self-pay | Admitting: Physician Assistant

## 2024-02-07 DIAGNOSIS — N939 Abnormal uterine and vaginal bleeding, unspecified: Secondary | ICD-10-CM

## 2024-02-12 ENCOUNTER — Ambulatory Visit
Admission: RE | Admit: 2024-02-12 | Discharge: 2024-02-12 | Discharge status: Home / Self Care | Source: Ambulatory Visit | Attending: Physician Assistant | Admitting: Physician Assistant

## 2024-02-12 DIAGNOSIS — N939 Abnormal uterine and vaginal bleeding, unspecified: Secondary | ICD-10-CM

## 2024-05-25 ENCOUNTER — Emergency Department (HOSPITAL_COMMUNITY)

## 2024-05-25 ENCOUNTER — Other Ambulatory Visit: Payer: Self-pay

## 2024-05-25 ENCOUNTER — Encounter (HOSPITAL_COMMUNITY): Payer: Self-pay

## 2024-05-25 ENCOUNTER — Inpatient Hospital Stay (HOSPITAL_COMMUNITY)
Admission: EM | Admit: 2024-05-25 | Discharge: 2024-05-28 | DRG: 392 | Disposition: A | Discharge status: Home / Self Care | Attending: Internal Medicine | Admitting: Internal Medicine

## 2024-05-25 DIAGNOSIS — Z1152 Encounter for screening for COVID-19: Secondary | ICD-10-CM

## 2024-05-25 DIAGNOSIS — M549 Dorsalgia, unspecified: Secondary | ICD-10-CM | POA: Diagnosis not present

## 2024-05-25 DIAGNOSIS — Z8261 Family history of arthritis: Secondary | ICD-10-CM

## 2024-05-25 DIAGNOSIS — F12988 Cannabis use, unspecified with other cannabis-induced disorder: Secondary | ICD-10-CM | POA: Diagnosis present

## 2024-05-25 DIAGNOSIS — Z79899 Other long term (current) drug therapy: Secondary | ICD-10-CM

## 2024-05-25 DIAGNOSIS — J45909 Unspecified asthma, uncomplicated: Secondary | ICD-10-CM | POA: Diagnosis present

## 2024-05-25 DIAGNOSIS — E663 Overweight: Secondary | ICD-10-CM | POA: Diagnosis present

## 2024-05-25 DIAGNOSIS — M5126 Other intervertebral disc displacement, lumbar region: Secondary | ICD-10-CM | POA: Diagnosis present

## 2024-05-25 DIAGNOSIS — Z91040 Latex allergy status: Secondary | ICD-10-CM

## 2024-05-25 DIAGNOSIS — R1110 Vomiting, unspecified: Principal | ICD-10-CM

## 2024-05-25 DIAGNOSIS — R112 Nausea with vomiting, unspecified: Secondary | ICD-10-CM | POA: Diagnosis not present

## 2024-05-25 DIAGNOSIS — G8929 Other chronic pain: Secondary | ICD-10-CM | POA: Diagnosis present

## 2024-05-25 DIAGNOSIS — Z9102 Food additives allergy status: Secondary | ICD-10-CM

## 2024-05-25 DIAGNOSIS — F32A Depression, unspecified: Secondary | ICD-10-CM

## 2024-05-25 DIAGNOSIS — F17290 Nicotine dependence, other tobacco product, uncomplicated: Secondary | ICD-10-CM | POA: Diagnosis present

## 2024-05-25 DIAGNOSIS — R1116 Cannabis hyperemesis syndrome: Principal | ICD-10-CM | POA: Diagnosis present

## 2024-05-25 DIAGNOSIS — Z825 Family history of asthma and other chronic lower respiratory diseases: Secondary | ICD-10-CM

## 2024-05-25 DIAGNOSIS — F319 Bipolar disorder, unspecified: Secondary | ICD-10-CM | POA: Diagnosis present

## 2024-05-25 DIAGNOSIS — K219 Gastro-esophageal reflux disease without esophagitis: Secondary | ICD-10-CM | POA: Diagnosis present

## 2024-05-25 DIAGNOSIS — Z6828 Body mass index (BMI) 28.0-28.9, adult: Secondary | ICD-10-CM

## 2024-05-25 DIAGNOSIS — F419 Anxiety disorder, unspecified: Secondary | ICD-10-CM | POA: Diagnosis present

## 2024-05-25 DIAGNOSIS — R001 Bradycardia, unspecified: Secondary | ICD-10-CM | POA: Diagnosis present

## 2024-05-25 DIAGNOSIS — M5416 Radiculopathy, lumbar region: Secondary | ICD-10-CM | POA: Diagnosis present

## 2024-05-25 DIAGNOSIS — Z888 Allergy status to other drugs, medicaments and biological substances status: Secondary | ICD-10-CM

## 2024-05-25 LAB — COMPREHENSIVE METABOLIC PANEL WITH GFR
ALT: 21 U/L (ref 0–44)
AST: 21 U/L (ref 15–41)
Albumin: 4.5 g/dL (ref 3.5–5.0)
Alkaline Phosphatase: 64 U/L (ref 38–126)
Anion gap: 11 (ref 5–15)
BUN: 12 mg/dL (ref 6–20)
CO2: 22 mmol/L (ref 22–32)
Calcium: 9.8 mg/dL (ref 8.9–10.3)
Chloride: 105 mmol/L (ref 98–111)
Creatinine, Ser: 0.94 mg/dL (ref 0.44–1.00)
GFR, Estimated: >60 mL/min (ref >60)
Glucose, Bld: 131 mg/dL — ABNORMAL HIGH (ref 70–99)
Potassium: 4.4 mmol/L (ref 3.5–5.1)
Sodium: 137 mmol/L (ref 135–145)
Total Bilirubin: 0.4 mg/dL (ref 0.0–1.2)
Total Protein: 7.8 g/dL (ref 6.5–8.1)

## 2024-05-25 LAB — URINALYSIS, ROUTINE W REFLEX MICROSCOPIC
Bacteria, UA: NONE SEEN
Bilirubin Urine: NEGATIVE
Glucose, UA: NEGATIVE mg/dL
Hgb urine dipstick: NEGATIVE
Ketones, ur: 5 mg/dL — AB
Leukocytes,Ua: NEGATIVE
Nitrite: NEGATIVE
Protein, ur: 30 mg/dL — AB
Specific Gravity, Urine: 1.023 (ref 1.005–1.030)
pH: 8 (ref 5.0–8.0)

## 2024-05-25 LAB — CBC WITH DIFFERENTIAL/PLATELET
Abs Immature Granulocytes: 0.02 K/uL (ref 0.00–0.07)
Basophils Absolute: 0 K/uL (ref 0.0–0.1)
Basophils Relative: 0 %
Eosinophils Absolute: 0 K/uL (ref 0.0–0.5)
Eosinophils Relative: 1 %
HCT: 38.7 % (ref 36.0–46.0)
Hemoglobin: 12.5 g/dL (ref 12.0–15.0)
Immature Granulocytes: 0 %
Lymphocytes Relative: 9 %
Lymphs Abs: 0.7 K/uL (ref 0.7–4.0)
MCH: 27.2 pg (ref 26.0–34.0)
MCHC: 32.3 g/dL (ref 30.0–36.0)
MCV: 84.1 fL (ref 80.0–100.0)
Monocytes Absolute: 0.2 K/uL (ref 0.1–1.0)
Monocytes Relative: 3 %
Neutro Abs: 6.9 K/uL (ref 1.7–7.7)
Neutrophils Relative %: 87 %
Platelets: 350 K/uL (ref 150–400)
RBC: 4.6 MIL/uL (ref 3.87–5.11)
RDW: 15.8 % — ABNORMAL HIGH (ref 11.5–15.5)
WBC: 8 K/uL (ref 4.0–10.5)
nRBC: 0 % (ref 0.0–0.2)

## 2024-05-25 LAB — URINE DRUG SCREEN
Amphetamines: NEGATIVE
Barbiturates: NEGATIVE
Benzodiazepines: NEGATIVE
Cocaine: NEGATIVE
Fentanyl: NEGATIVE
Methadone Scn, Ur: NEGATIVE
Opiates: NEGATIVE
Tetrahydrocannabinol: POSITIVE — AB

## 2024-05-25 LAB — RESP PANEL BY RT-PCR (RSV, FLU A&B, COVID)  RVPGX2
Influenza A by PCR: NEGATIVE
Influenza B by PCR: NEGATIVE
Resp Syncytial Virus by PCR: NEGATIVE
SARS Coronavirus 2 by RT PCR: NEGATIVE

## 2024-05-25 LAB — LIPASE, BLOOD: Lipase: 52 U/L — ABNORMAL HIGH (ref 11–51)

## 2024-05-25 LAB — HCG, SERUM, QUALITATIVE: Preg, Serum: NEGATIVE

## 2024-05-25 MED ORDER — ACETAMINOPHEN 650 MG RE SUPP: 650.0000 mg | Freq: Four times a day (QID) | RECTAL | Status: DC | PRN

## 2024-05-25 MED ORDER — IOHEXOL 300 MG/ML  SOLN: 100.0000 mL | Freq: Once | INTRAMUSCULAR | Status: DC | PRN

## 2024-05-25 MED ORDER — LACTATED RINGERS IV SOLN
INTRAVENOUS | Status: AC
Start: 2024-05-25 — End: 2024-05-26

## 2024-05-25 MED ORDER — DROPERIDOL 2.5 MG/ML IJ SOLN
1.2500 mg | Freq: Once | INTRAMUSCULAR | Status: AC
  Administered 2024-05-25: 1.25 mg via INTRAVENOUS
  Filled 2024-05-25: qty 2

## 2024-05-25 MED ORDER — FENTANYL CITRATE PF 50 MCG/ML IJ SOSY
50.0000 ug | PREFILLED_SYRINGE | Freq: Once | INTRAMUSCULAR | Status: AC
  Administered 2024-05-25: 50 ug via INTRAVENOUS
  Filled 2024-05-25: qty 1

## 2024-05-25 MED ORDER — PANTOPRAZOLE SODIUM 40 MG IV SOLR
40.0000 mg | Freq: Once | INTRAVENOUS | Status: AC
  Administered 2024-05-25: 40 mg via INTRAVENOUS
  Filled 2024-05-25: qty 10

## 2024-05-25 MED ORDER — ONDANSETRON HCL 4 MG/2ML IJ SOLN
4.0000 mg | Freq: Once | INTRAMUSCULAR | Status: AC
  Administered 2024-05-25: 4 mg via INTRAVENOUS
  Filled 2024-05-25: qty 2

## 2024-05-25 MED ORDER — SENNOSIDES-DOCUSATE SODIUM 8.6-50 MG PO TABS
1.0000 | ORAL_TABLET | Freq: Every evening | ORAL | Status: DC | PRN
  Administered 2024-05-27: 1 via ORAL
  Filled 2024-05-25: qty 1

## 2024-05-25 MED ORDER — ENOXAPARIN SODIUM 40 MG/0.4ML IJ SOSY
40.0000 mg | PREFILLED_SYRINGE | INTRAMUSCULAR | Status: DC
  Administered 2024-05-28: 40 mg via SUBCUTANEOUS
  Filled 2024-05-25 (×2): qty 0.4

## 2024-05-25 MED ORDER — ONDANSETRON HCL 4 MG PO TABS: 4.0000 mg | ORAL_TABLET | Freq: Four times a day (QID) | ORAL | Status: DC | PRN

## 2024-05-25 MED ORDER — BISACODYL 5 MG PO TBEC: 5.0000 mg | DELAYED_RELEASE_TABLET | Freq: Every day | ORAL | Status: DC | PRN

## 2024-05-25 MED ORDER — ONDANSETRON HCL 4 MG/2ML IJ SOLN
4.0000 mg | Freq: Four times a day (QID) | INTRAMUSCULAR | Status: DC | PRN
  Administered 2024-05-26 (×2): 4 mg via INTRAVENOUS
  Filled 2024-05-25 (×2): qty 2

## 2024-05-25 MED ORDER — ALUM & MAG HYDROXIDE-SIMETH 200-200-20 MG/5ML PO SUSP
30.0000 mL | Freq: Once | ORAL | Status: AC
  Administered 2024-05-25: 30 mL via ORAL
  Filled 2024-05-25: qty 30

## 2024-05-25 MED ORDER — HYOSCYAMINE SULFATE 0.125 MG SL SUBL
0.2500 mg | SUBLINGUAL_TABLET | Freq: Once | SUBLINGUAL | Status: AC
  Administered 2024-05-25: 0.25 mg via SUBLINGUAL
  Filled 2024-05-25: qty 2

## 2024-05-25 MED ORDER — SODIUM CHLORIDE 0.9 % IV SOLN
12.5000 mg | Freq: Once | INTRAVENOUS | Status: AC
  Administered 2024-05-25: 12.5 mg via INTRAVENOUS
  Filled 2024-05-25: qty 0.5

## 2024-05-25 MED ORDER — ACETAMINOPHEN 325 MG PO TABS: 650.0000 mg | ORAL_TABLET | Freq: Four times a day (QID) | ORAL | Status: DC | PRN

## 2024-05-25 MED ORDER — LACTATED RINGERS IV BOLUS
1000.0000 mL | Freq: Once | INTRAVENOUS | Status: AC
  Administered 2024-05-25: 1000 mL via INTRAVENOUS

## 2024-05-25 MED ORDER — SODIUM CHLORIDE (PF) 0.9 % IJ SOLN
INTRAMUSCULAR | Status: AC
  Filled 2024-05-25: qty 50

## 2024-05-25 NOTE — H&P (Signed)
 History and Physical  Melanie Hancock FMW:[id number] DOB: 04/10/1996 DOA: 05/25/2024  PCP: Department, [facility]   Chief Complaint: Abdominal pain, nausea and vomiting  HPI: Melanie Hancock is a 28 y.o. female with medical history significant for bipolar disorder, asthma, anxiety and depression, lumbar disc herniation, chronic back pain, GERD and cannabis hyperemesis syndrome who presented to the ED for evaluation of abdominal pain, nausea and vomiting.  ED Course: Initial vitals show patient afebrile, RR 16-18, HR 40-70s, SBP 110-140s, SpO2 100% on room air. Initial labs significant for UDS positive for THC otherwise normal CBC, CMP, negative pregnancy test, negative flu, RSV and COVID test.  CT A/P shows no acute intra abdominal or pelvic pathology.  Pt received IV LR bolus, IV Zofran , IV Phenergan  and IV fentanyl , droperidol, Maalox/Mylanta and hyoscyamine.  TRH was consulted for admission.   Review of Systems: Please see HPI for pertinent positives and negatives. A complete 10 system review of systems are otherwise negative.  Past Medical History:  Diagnosis Date   Anemia    Anxiety    Asthma    Bipolar disorder (HCC)    Blood transfusion without reported diagnosis    Chronic back pain    Depression    GERD (gastroesophageal reflux disease)    Headache    Post partum depression    Pyelonephritis    UTI (lower urinary tract infection)    Past Surgical History:  Procedure Laterality Date   DILATION AND CURETTAGE OF UTERUS     INDUCED ABORTION     LUMBAR LAMINECTOMY/DECOMPRESSION MICRODISCECTOMY Bilateral 06/27/2023   Procedure: L5-S1 DECOMPRESSION WITH BILATERAL FORAMINOTOMIES;  Surgeon: Burnetta Aures, MD;  Location: [facility] OR;  Service: Orthopedics;  Laterality: Bilateral;  2.5 hrs 3 C-Bed   WISDOM TOOTH EXTRACTION     Social History:  reports that she has been smoking e-cigarettes. She has never used smokeless tobacco. She reports that she does not currently use  alcohol. She reports current drug use. Drug: Marijuana.  Allergies  Allergen Reactions   Iodine     Pt states it burns and itches     Latex Itching   Mushroom Extract Complex (Obsolete)     Throat itches     Family History  Problem Relation Age of Onset   Arthritis Mother    Healthy Father    Arthritis Maternal Grandmother    Asthma Son    Alcohol abuse Neg Hx      Prior to Admission medications   Medication Sig Start Date End Date Taking? Authorizing Provider  pantoprazole  (PROTONIX ) 20 MG tablet Take 1 tablet (20 mg total) by mouth daily. 09/17/19 11/12/19  Trudy Earnie LITTIE, CNM    Physical Exam: BP (!) 141/75   Pulse (!) 47   Temp 97.8 F (36.6 C) (Oral)   Resp 18   LMP 07/30/2023   SpO2 100%  General: Pleasant, well-appearing *** laying in bed. No acute distress. HEENT: [place]/AT. Anicteric sclera CV: RRR. No murmurs, rubs, or gallops. No LE edema Pulmonary: Lungs CTAB. Normal effort. No wheezing or rales. Abdominal: Soft, nontender, nondistended. Normal bowel sounds. Extremities: Palpable radial and DP pulses. Normal ROM. Skin: Warm and dry. No obvious rash or lesions. Neuro: A&Ox3. Moves all extremities. Normal sensation to light touch. No focal deficit. Psych: Normal mood and affect          Labs on Admission:  Basic Metabolic Panel: Recent Labs  Lab 05/25/24 1250  NA 137  K 4.4  CL 105  CO2 22  GLUCOSE 131*  BUN 12  CREATININE 0.94  CALCIUM  9.8   Liver Function Tests: Recent Labs  Lab 05/25/24 1250  AST 21  ALT 21  ALKPHOS 64  BILITOT 0.4  PROT 7.8  ALBUMIN 4.5   Recent Labs  Lab 05/25/24 1250  LIPASE 52*   No results for input(s): AMMONIA in the last 168 hours. CBC: Recent Labs  Lab 05/25/24 1250  WBC 8.0  NEUTROABS 6.9  HGB 12.5  HCT 38.7  MCV 84.1  PLT 350   Cardiac Enzymes: No results for input(s): CKTOTAL, CKMB, CKMBINDEX, TROPONINI in the last 168 hours. BNP (last 3 results) No results for input(s): BNP in  the last 8760 hours.  ProBNP (last 3 results) No results for input(s): PROBNP in the last 8760 hours.  CBG: No results for input(s): GLUCAP in the last 168 hours.  Radiological Exams on Admission: CT ABDOMEN PELVIS WO CONTRAST Result Date: 05/25/2024 CLINICAL DATA:  Abdominal pain, acute, nonlocalized a 28 y.o. female was evaluated in triage. Pt complains of NV abdominal pain. Worse in LLQ and left flank. Denies dysuria. EXAM: CT ABDOMEN AND PELVIS WITHOUT CONTRAST TECHNIQUE: Multidetector CT imaging of the abdomen and pelvis was performed following the standard protocol without IV contrast. RADIATION DOSE REDUCTION: This exam was performed according to the departmental dose-optimization program which includes automated exposure control, adjustment of the mA and/or kV according to patient size and/or use of iterative reconstruction technique. COMPARISON:  CT abdomen pelvis 09/25/2022 FINDINGS: Lower chest: No acute abnormality. Hepatobiliary: No focal liver abnormality. No gallstones, gallbladder wall thickening, or pericholecystic fluid. No biliary dilatation. Pancreas: No focal lesion. Normal pancreatic contour. No surrounding inflammatory changes. No main pancreatic ductal dilatation. Spleen: Normal in size without focal abnormality. Adrenals/Urinary Tract: No adrenal nodule bilaterally. No nephrolithiasis and no hydronephrosis. No definite contour-deforming renal mass. No ureterolithiasis or hydroureter. The urinary bladder is unremarkable. Stomach/Bowel: Stomach is within normal limits. No evidence of bowel wall thickening or dilatation. Fatty infiltration of the wall of the large bowel suggestive of chronic inflammation or constipation. Appendix appears normal. Vascular/Lymphatic: No abdominal aorta or iliac aneurysm. No abdominal, pelvic, or inguinal lymphadenopathy. Reproductive: Uterus and bilateral adnexa are unremarkable. Other: No intraperitoneal free fluid. No intraperitoneal free gas. No  organized fluid collection. Musculoskeletal: Tiny fat containing umbilical hernia. No suspicious lytic or blastic osseous lesions. No acute displaced fracture. IMPRESSION: No acute intra-abdominal or intrapelvic abnormality with limited evaluation on this noncontrast study. Electronically Signed   By: Morgane  Naveau M.D.   On: 05/25/2024 20:17   Independent interpretation of EKG: Sinus bradycardia with a rate of 46  Assessment/Plan Melanie Hancock is a 28 y.o. female with medical history significant for bipolar disorder, asthma, anxiety and depression, lumbar disc herniation, chronic back pain, GERD and cannabis hyperemesis syndrome who presented to the ED for evaluation of abdominal pain, nausea and vomiting.   #***  #***  #***  #***  #***  #***  #***  DVT prophylaxis: Lovenox     Code Status: Prior  Consults called: None  Family Communication: ***  Severity of Illness: The appropriate patient status for this patient is OBSERVATION. Observation status is judged to be reasonable and necessary in order to provide the required intensity of service to ensure the patient's safety. The patient's presenting symptoms, physical exam findings, and initial radiographic and laboratory data in the context of their medical condition is felt to place them at decreased risk for further clinical deterioration. Furthermore, it  is anticipated that the patient will be medically stable for discharge from the hospital within 2 midnights of admission.   Level of care: Telemetry    Lou Claretta HERO, MD 05/25/2024, 9:11 PM [facility] Pager: [phone] Isaiah 41:10   If 7PM-7AM, please contact night-coverage [email or web address] Password [email or web address]

## 2024-05-25 NOTE — ED Provider Triage Note (Signed)
 Emergency Medicine Provider Triage Evaluation Note  Melanie Hancock , a 28 y.o. female  was evaluated in triage.  Pt complains of NV abdominal pain. Worse in LLQ and left flank. Denies dysuria.   Review of Systems  Positive: Abd pain Negative: Fever  Physical Exam  BP (!) 147/88 (BP Location: Left Arm)   Pulse (!) 49   Temp 97.9 F (36.6 C) (Oral)   Resp 16   LMP 07/30/2023   SpO2 100%  Gen:   Awake, no distress   Resp:  Normal effort  MSK:   Moves extremities without difficulty  Other:  Generalized TTP on exam.   Medical Decision Making  Medically screening exam initiated at 3:52 PM.  Appropriate orders placed.  Melanie Hancock was informed that the remainder of the evaluation will be completed by another provider, this initial triage assessment does not replace that evaluation, and the importance of remaining in the ED until their evaluation is complete.    Shermon Warren SAILOR, PA-C 05/25/24 1552

## 2024-05-25 NOTE — ED Provider Notes (Signed)
 [place] EMERGENCY DEPARTMENT AT [facility] Provider Note   CSN: [id number] Arrival date & time: 05/25/24  1239     Patient presents with: Emesis   Melanie Hancock is a 28 y.o. female.   28 year old female with no reported past medical history presenting to the emergency department today right-sided abdominal and flank pain.  The patient states that this began this morning.  She has had a few episodes of nonbloody, nonbilious emesis throughout the day today.  She has been afebrile.  She has been having some loose stools as well.  Denies any blood in her stool or dark stools.  She was brought to the emergency department for further evaluation regarding this due to ongoing symptoms.   Emesis Associated symptoms: diarrhea        Prior to Admission medications   Medication Sig Start Date End Date Taking? Authorizing Provider  pantoprazole  (PROTONIX ) 20 MG tablet Take 1 tablet (20 mg total) by mouth daily. 09/17/19 11/12/19  Trudy Earnie LITTIE, CNM    Allergies: Iodine, Latex, and Mushroom extract complex (obsolete)    Review of Systems  Gastrointestinal:  Positive for diarrhea, nausea and vomiting.  All other systems reviewed and are negative.   Updated Vital Signs BP (!) 141/75   Pulse (!) 47   Temp 97.8 F (36.6 C) (Oral)   Resp 18   LMP 07/30/2023   SpO2 100%   Physical Exam Vitals and nursing note reviewed.   Gen: Appears uncomfortable Eyes: PERRL, EOMI HEENT: no oropharyngeal swelling Neck: trachea midline Resp: clear to auscultation bilaterally Card: RRR, no murmurs, rubs, or gallops Abd: Left-sided periumbilical tenderness with left-sided CVA tenderness without guarding or rebound Extremities: no calf tenderness, no edema Vascular: 2+ radial pulses bilaterally, 2+ DP pulses bilaterally Skin: no rashes Psyc: acting appropriately   (all labs ordered are listed, but only abnormal results are displayed) Labs Reviewed  COMPREHENSIVE METABOLIC  PANEL WITH GFR - Abnormal; Notable for the following components:      Result Value   Glucose, Bld 131 (*)    All other components within normal limits  LIPASE, BLOOD - Abnormal; Notable for the following components:   Lipase 52 (*)    All other components within normal limits  CBC WITH DIFFERENTIAL/PLATELET - Abnormal; Notable for the following components:   RDW 15.8 (*)    All other components within normal limits  URINALYSIS, ROUTINE W REFLEX MICROSCOPIC - Abnormal; Notable for the following components:   Ketones, ur 5 (*)    Protein, ur 30 (*)    All other components within normal limits  URINE DRUG SCREEN - Abnormal; Notable for the following components:   Tetrahydrocannabinol POSITIVE (*)    All other components within normal limits  RESP PANEL BY RT-PCR (RSV, FLU A&B, COVID)  RVPGX2  HCG, SERUM, QUALITATIVE    EKG: EKG Interpretation Date/Time:  Monday May 25 2024 15:46:40 EDT Ventricular Rate:  46 PR Interval:  163 QRS Duration:  91 QT Interval:  455 QTC Calculation: 398 R Axis:   89  Text Interpretation: Slow sinus arrhythmia Baseline wander in lead(s) I III aVL Confirmed by Ula Barter [phone]) on 05/25/2024 5:30:38 PM  Radiology: CT ABDOMEN PELVIS WO CONTRAST Result Date: 05/25/2024 CLINICAL DATA:  Abdominal pain, acute, nonlocalized a 28 y.o. female was evaluated in triage. Pt complains of NV abdominal pain. Worse in LLQ and left flank. Denies dysuria. EXAM: CT ABDOMEN AND PELVIS WITHOUT CONTRAST TECHNIQUE: Multidetector CT imaging of the abdomen  and pelvis was performed following the standard protocol without IV contrast. RADIATION DOSE REDUCTION: This exam was performed according to the departmental dose-optimization program which includes automated exposure control, adjustment of the mA and/or kV according to patient size and/or use of iterative reconstruction technique. COMPARISON:  CT abdomen pelvis 09/25/2022 FINDINGS: Lower chest: No acute abnormality.  Hepatobiliary: No focal liver abnormality. No gallstones, gallbladder wall thickening, or pericholecystic fluid. No biliary dilatation. Pancreas: No focal lesion. Normal pancreatic contour. No surrounding inflammatory changes. No main pancreatic ductal dilatation. Spleen: Normal in size without focal abnormality. Adrenals/Urinary Tract: No adrenal nodule bilaterally. No nephrolithiasis and no hydronephrosis. No definite contour-deforming renal mass. No ureterolithiasis or hydroureter. The urinary bladder is unremarkable. Stomach/Bowel: Stomach is within normal limits. No evidence of bowel wall thickening or dilatation. Fatty infiltration of the wall of the large bowel suggestive of chronic inflammation or constipation. Appendix appears normal. Vascular/Lymphatic: No abdominal aorta or iliac aneurysm. No abdominal, pelvic, or inguinal lymphadenopathy. Reproductive: Uterus and bilateral adnexa are unremarkable. Other: No intraperitoneal free fluid. No intraperitoneal free gas. No organized fluid collection. Musculoskeletal: Tiny fat containing umbilical hernia. No suspicious lytic or blastic osseous lesions. No acute displaced fracture. IMPRESSION: No acute intra-abdominal or intrapelvic abnormality with limited evaluation on this noncontrast study. Electronically Signed   By: Morgane  Naveau M.D.   On: 05/25/2024 20:17     Procedures   Medications Ordered in the ED  iohexol (OMNIPAQUE) 300 MG/ML solution 100 mL (has no administration in time range)  ondansetron  (ZOFRAN ) injection 4 mg (4 mg Intravenous Given 05/25/24 1614)  droperidol (INAPSINE) 2.5 MG/ML injection 1.25 mg (1.25 mg Intravenous Given 05/25/24 1748)  fentaNYL  (SUBLIMAZE ) injection 50 mcg (50 mcg Intravenous Given 05/25/24 1747)  lactated ringers  bolus 1,000 mL (0 mLs Intravenous Stopped 05/25/24 2026)  promethazine  (PHENERGAN ) 12.5 mg in sodium chloride  0.9 % 50 mL IVPB (12.5 mg Intravenous New Bag/Given 05/25/24 2026)  alum & mag  hydroxide-simeth (MAALOX/MYLANTA) 200-200-20 MG/5ML suspension 30 mL (30 mLs Oral Given 05/25/24 2041)  hyoscyamine (LEVSIN SL) SL tablet 0.25 mg (0.25 mg Sublingual Given 05/25/24 2040)                                    Medical Decision Making 28 year old female with no reported past medical history presenting to the emergency department today with abdominal and flank pain.  I will further evaluate the patient here with basic labs including LFTs and a lipase to evaluate for hepatobiliary pathology or pancreatitis.  Will also obtain a CT scan to evaluate for ureterolithiasis, diverticulitis, colitis, appendicitis, or other intra-abdominal pathology.  I will give the patient fentanyl  here.  Her UDS that was drawn at triage is positive so this very well could be due to cannabis hyperemesis.  Will give her Inapsine as her QTc is less than 450.  I will reevaluate for ultimate disposition.  Also give her IV fluids.  The patient's labs and CT scans are largely unremarkable.  Despite this the patient continues to have vomiting.  She has been giving multiple antiemetics with continued vomiting.  Calls placed to the hospital service for admission for intractable vomiting.  Amount and/or Complexity of Data Reviewed Labs: ordered.  Risk OTC drugs. Prescription drug management. Decision regarding hospitalization.        Final diagnoses:  Intractable vomiting    ED Discharge Orders     None  Ula Prentice SAUNDERS, MD 05/25/24 [phone]

## 2024-05-25 NOTE — ED Triage Notes (Signed)
 Pt BIB ems from home with n/v/d since this morning.

## 2024-05-25 NOTE — ED Notes (Signed)
 Writer informed pt a urine sample is needed. Pt advised she could not provide one at this time.

## 2024-05-26 DIAGNOSIS — R1116 Cannabis hyperemesis syndrome: Secondary | ICD-10-CM | POA: Diagnosis present

## 2024-05-26 DIAGNOSIS — R112 Nausea with vomiting, unspecified: Secondary | ICD-10-CM | POA: Diagnosis not present

## 2024-05-26 DIAGNOSIS — F319 Bipolar disorder, unspecified: Secondary | ICD-10-CM | POA: Diagnosis present

## 2024-05-26 DIAGNOSIS — Z888 Allergy status to other drugs, medicaments and biological substances status: Secondary | ICD-10-CM | POA: Diagnosis not present

## 2024-05-26 DIAGNOSIS — Z6828 Body mass index (BMI) 28.0-28.9, adult: Secondary | ICD-10-CM | POA: Diagnosis not present

## 2024-05-26 DIAGNOSIS — M5416 Radiculopathy, lumbar region: Secondary | ICD-10-CM | POA: Diagnosis present

## 2024-05-26 DIAGNOSIS — G8929 Other chronic pain: Secondary | ICD-10-CM | POA: Diagnosis present

## 2024-05-26 DIAGNOSIS — R001 Bradycardia, unspecified: Secondary | ICD-10-CM

## 2024-05-26 DIAGNOSIS — Z79899 Other long term (current) drug therapy: Secondary | ICD-10-CM | POA: Diagnosis not present

## 2024-05-26 DIAGNOSIS — E663 Overweight: Secondary | ICD-10-CM | POA: Diagnosis present

## 2024-05-26 DIAGNOSIS — J45909 Unspecified asthma, uncomplicated: Secondary | ICD-10-CM | POA: Diagnosis present

## 2024-05-26 DIAGNOSIS — F12988 Cannabis use, unspecified with other cannabis-induced disorder: Secondary | ICD-10-CM | POA: Diagnosis present

## 2024-05-26 DIAGNOSIS — Z1152 Encounter for screening for COVID-19: Secondary | ICD-10-CM | POA: Diagnosis not present

## 2024-05-26 DIAGNOSIS — K219 Gastro-esophageal reflux disease without esophagitis: Secondary | ICD-10-CM | POA: Diagnosis present

## 2024-05-26 DIAGNOSIS — Z9102 Food additives allergy status: Secondary | ICD-10-CM | POA: Diagnosis not present

## 2024-05-26 DIAGNOSIS — R1110 Vomiting, unspecified: Secondary | ICD-10-CM | POA: Diagnosis present

## 2024-05-26 DIAGNOSIS — M5126 Other intervertebral disc displacement, lumbar region: Secondary | ICD-10-CM | POA: Diagnosis present

## 2024-05-26 DIAGNOSIS — F17290 Nicotine dependence, other tobacco product, uncomplicated: Secondary | ICD-10-CM | POA: Diagnosis present

## 2024-05-26 DIAGNOSIS — Z825 Family history of asthma and other chronic lower respiratory diseases: Secondary | ICD-10-CM | POA: Diagnosis not present

## 2024-05-26 DIAGNOSIS — Z91040 Latex allergy status: Secondary | ICD-10-CM | POA: Diagnosis not present

## 2024-05-26 DIAGNOSIS — Z8261 Family history of arthritis: Secondary | ICD-10-CM | POA: Diagnosis not present

## 2024-05-26 DIAGNOSIS — F419 Anxiety disorder, unspecified: Secondary | ICD-10-CM | POA: Diagnosis present

## 2024-05-26 LAB — PHOSPHORUS: Phosphorus: 3.7 mg/dL (ref 2.5–4.6)

## 2024-05-26 LAB — CBC
HCT: 33.9 % — ABNORMAL LOW (ref 36.0–46.0)
Hemoglobin: 10.6 g/dL — ABNORMAL LOW (ref 12.0–15.0)
MCH: 26.2 pg (ref 26.0–34.0)
MCHC: 31.3 g/dL (ref 30.0–36.0)
MCV: 83.7 fL (ref 80.0–100.0)
Platelets: 292 K/uL (ref 150–400)
RBC: 4.05 MIL/uL (ref 3.87–5.11)
RDW: 15.7 % — ABNORMAL HIGH (ref 11.5–15.5)
WBC: 7.3 K/uL (ref 4.0–10.5)
nRBC: 0 % (ref 0.0–0.2)

## 2024-05-26 LAB — BASIC METABOLIC PANEL WITH GFR
Anion gap: 11 (ref 5–15)
BUN: 10 mg/dL (ref 6–20)
CO2: 22 mmol/L (ref 22–32)
Calcium: 9.3 mg/dL (ref 8.9–10.3)
Chloride: 104 mmol/L (ref 98–111)
Creatinine, Ser: 0.74 mg/dL (ref 0.44–1.00)
GFR, Estimated: >60 mL/min (ref >60)
Glucose, Bld: 113 mg/dL — ABNORMAL HIGH (ref 70–99)
Potassium: 3.8 mmol/L (ref 3.5–5.1)
Sodium: 137 mmol/L (ref 135–145)

## 2024-05-26 LAB — TSH: TSH: 0.428 u[IU]/mL (ref 0.350–4.500)

## 2024-05-26 LAB — MAGNESIUM: Magnesium: 1.9 mg/dL (ref 1.7–2.4)

## 2024-05-26 LAB — HIV ANTIBODY (ROUTINE TESTING W REFLEX): HIV Screen 4th Generation wRfx: NONREACTIVE

## 2024-05-26 MED ORDER — HYDROMORPHONE HCL 1 MG/ML IJ SOLN
0.5000 mg | Freq: Four times a day (QID) | INTRAMUSCULAR | Status: DC | PRN
  Administered 2024-05-26 – 2024-05-27 (×3): 0.5 mg via INTRAVENOUS
  Filled 2024-05-26: qty 1
  Filled 2024-05-26: qty 0.5
  Filled 2024-05-26: qty 1

## 2024-05-26 MED ORDER — PROCHLORPERAZINE EDISYLATE 10 MG/2ML IJ SOLN
10.0000 mg | Freq: Four times a day (QID) | INTRAMUSCULAR | Status: DC | PRN
Start: 2024-05-26 — End: 2024-05-28
  Administered 2024-05-26 – 2024-05-28 (×5): 10 mg via INTRAVENOUS
  Filled 2024-05-26 (×5): qty 2

## 2024-05-26 MED ORDER — POLYETHYLENE GLYCOL 3350 17 G PO PACK
17.0000 g | PACK | Freq: Every day | ORAL | Status: DC
  Filled 2024-05-26: qty 1

## 2024-05-26 MED ORDER — METOCLOPRAMIDE HCL 5 MG/ML IJ SOLN
5.0000 mg | Freq: Four times a day (QID) | INTRAMUSCULAR | Status: AC
  Administered 2024-05-26 – 2024-05-27 (×6): 5 mg via INTRAVENOUS
  Filled 2024-05-26 (×6): qty 2

## 2024-05-26 MED ORDER — PANTOPRAZOLE SODIUM 40 MG IV SOLR
40.0000 mg | Freq: Every day | INTRAVENOUS | Status: DC
  Administered 2024-05-26 – 2024-05-28 (×3): 40 mg via INTRAVENOUS
  Filled 2024-05-26 (×3): qty 10

## 2024-05-26 NOTE — Progress Notes (Signed)
 PROGRESS NOTE    Melanie Hancock  FMW:[id number] DOB: 1995/12/02 DOA: 05/25/2024 PCP: Department, [facility]    Brief Narrative:  Melanie Hancock is a 28 y.o. female with medical history significant for bipolar disorder, asthma, anxiety and depression, lumbar disc herniation, chronic back pain, GERD and cannabis hyperemesis syndrome who presented to the ED for evaluation of abdominal pain, nausea and vomiting.  Patient reports feeling sick to her stomach after waking up this morning. She endorsed multiple nonbloody, nonbilious emesis throughout the day.  She also endorsed some loose stools and generalized abdominal pain worse in the epigastrium.  Daily marijuana use.    Assessment and Plan: Intractable nausea and vomiting due to Cannabis hyperemesis syndrome - Patient with history of cannabis hyperemesis syndrome presented with acute onset of nausea, vomiting and abdominal discomfort - Patient reports daily use of marijuana - CT A/P without contrast does not show any acute intra-abdominal or pelvic pathology - She is afebrile and without leukocytosis, nausea and vomiting likely secondary to cannabis hyperemesis syndrome - added reglan  x 24 hours to see if improves - Trend electrolytes - encouraged cessation of marijuana    Bradycardia - Patient with initial heart rate in the 70s with persistently in the 40s to 50s since admission - Patient with no chest pain, shortness of breath, dizziness or syncope - On chart review, patient does have a history of intermittent bradycardia often asymptomatic as far back as 2014 - Telemetry - TSH normal    Asthma - Chronic and stable    Bipolar disorder/ Anxiety and depression - Reports she is not on any medications - Follow-up with behavioral health in the outpatient    Lumbar disc herniation Lumbar radiculopathy Chronic back pain - Follow-up with orthopedic surgery in the outpatient -ambulate    GERD -  IV Protonix    DVT  prophylaxis: enoxaparin (LOVENOX) injection 40 mg Start: 05/26/24 1000    Code Status: Full Code   Disposition Plan:  Level of care: Telemetry Status is: Observation   Home in the AM if able to tolerate diet    Consultants:  none   Subjective: Up walking around the ER  Objective: Vitals:   05/26/24 0036 05/26/24 0357 05/26/24 0358 05/26/24 0724  BP: 119/67 116/80  (!) 106/56  Pulse: (!) 50 61  (!) 46  Resp: 16 16  18   Temp:   98.4 F (36.9 C) 98.3 F (36.8 C)  TempSrc:   Oral Oral  SpO2: 100% 100%  100%   No intake or output data in the 24 hours ending 05/26/24 0809 There were no vitals filed for this visit.  Examination:   General: Appearance:     Overweight female in no acute distress     Lungs:     Crespirations unlabored  Heart:    Bradycardic. Normal rhythm. No murmurs, rubs, or gallops.    MS:   All extremities are intact.    Neurologic:   Awake, alert       Data Reviewed: I have personally reviewed following labs and imaging studies  CBC: Recent Labs  Lab 05/25/24 1250 05/26/24 0500  WBC 8.0 7.3  NEUTROABS 6.9  --   HGB 12.5 10.6*  HCT 38.7 33.9*  MCV 84.1 83.7  PLT 350 292   Basic Metabolic Panel: Recent Labs  Lab 05/25/24 1250 05/26/24 0500  NA 137 137  K 4.4 3.8  CL 105 104  CO2 22 22  GLUCOSE 131* 113*  BUN 12 10  CREATININE 0.94 0.74  CALCIUM  9.8 9.3  MG  --  1.9  PHOS  --  3.7   GFR: CrCl cannot be calculated (Unknown ideal weight.). Liver Function Tests: Recent Labs  Lab 05/25/24 1250  AST 21  ALT 21  ALKPHOS 64  BILITOT 0.4  PROT 7.8  ALBUMIN 4.5   Recent Labs  Lab 05/25/24 1250  LIPASE 52*   No results for input(s): AMMONIA in the last 168 hours. Coagulation Profile: No results for input(s): INR, PROTIME in the last 168 hours. Cardiac Enzymes: No results for input(s): CKTOTAL, CKMB, CKMBINDEX, TROPONINI in the last 168 hours. BNP (last 3 results) No results for input(s): PROBNP in  the last 8760 hours. HbA1C: No results for input(s): HGBA1C in the last 72 hours. CBG: No results for input(s): GLUCAP in the last 168 hours. Lipid Profile: No results for input(s): CHOL, HDL, LDLCALC, TRIG, CHOLHDL, LDLDIRECT in the last 72 hours. Thyroid Function Tests: Recent Labs    05/26/24 0500  TSH 0.428   Anemia Panel: No results for input(s): VITAMINB12, FOLATE, FERRITIN, TIBC, IRON , RETICCTPCT in the last 72 hours. Sepsis Labs: No results for input(s): PROCALCITON, LATICACIDVEN in the last 168 hours.  Recent Results (from the past 240 hours)  Resp panel by RT-PCR (RSV, Flu A&B, Covid) Anterior Nasal Swab     Status: None   Collection Time: 05/25/24 12:49 PM   Specimen: Anterior Nasal Swab  Result Value Ref Range Status   SARS Coronavirus 2 by RT PCR NEGATIVE NEGATIVE Final    Comment: (NOTE) SARS-CoV-2 target nucleic acids are NOT DETECTED.  The SARS-CoV-2 RNA is generally detectable in upper respiratory specimens during the acute phase of infection. The lowest concentration of SARS-CoV-2 viral copies this assay can detect is 138 copies/mL. A negative result does not preclude SARS-Cov-2 infection and should not be used as the sole basis for treatment or other patient management decisions. A negative result may occur with  improper specimen collection/handling, submission of specimen other than nasopharyngeal swab, presence of viral mutation(s) within the areas targeted by this assay, and inadequate number of viral copies(<138 copies/mL). A negative result must be combined with clinical observations, patient history, and epidemiological information. The expected result is Negative.  Fact Sheet for Patients:  BloggerCourse.com  Fact Sheet for Healthcare Providers:  SeriousBroker.it  This test is no t yet approved or cleared by the [place]  FDA and  has been authorized for  detection and/or diagnosis of SARS-CoV-2 by FDA under an Emergency Use Authorization (EUA). This EUA will remain  in effect (meaning this test can be used) for the duration of the COVID-19 declaration under Section 564(b)(1) of the Act, 21 U.S.C.section 360bbb-3(b)(1), unless the authorization is terminated  or revoked sooner.       Influenza A by PCR NEGATIVE NEGATIVE Final   Influenza B by PCR NEGATIVE NEGATIVE Final    Comment: (NOTE) The Xpert Xpress SARS-CoV-2/FLU/RSV plus assay is intended as an aid in the diagnosis of influenza from Nasopharyngeal swab specimens and should not be used as a sole basis for treatment. Nasal washings and aspirates are unacceptable for Xpert Xpress SARS-CoV-2/FLU/RSV testing.  Fact Sheet for Patients: BloggerCourse.com  Fact Sheet for Healthcare Providers: SeriousBroker.it  This test is not yet approved or cleared by the [place]  FDA and has been authorized for detection and/or diagnosis of SARS-CoV-2 by FDA under an Emergency Use Authorization (EUA). This EUA will remain in effect (meaning this test can be used) for the duration  of the COVID-19 declaration under Section 564(b)(1) of the Act, 21 U.S.C. section 360bbb-3(b)(1), unless the authorization is terminated or revoked.     Resp Syncytial Virus by PCR NEGATIVE NEGATIVE Final    Comment: (NOTE) Fact Sheet for Patients: BloggerCourse.com  Fact Sheet for Healthcare Providers: SeriousBroker.it  This test is not yet approved or cleared by the [place]  FDA and has been authorized for detection and/or diagnosis of SARS-CoV-2 by FDA under an Emergency Use Authorization (EUA). This EUA will remain in effect (meaning this test can be used) for the duration of the COVID-19 declaration under Section 564(b)(1) of the Act, 21 U.S.C. section 360bbb-3(b)(1), unless the authorization is  terminated or revoked.  Performed at [facility], [address] [place], [place] [address]          Radiology Studies: CT ABDOMEN PELVIS WO CONTRAST Result Date: 05/25/2024 CLINICAL DATA:  Abdominal pain, acute, nonlocalized a 28 y.o. female was evaluated in triage. Pt complains of NV abdominal pain. Worse in LLQ and left flank. Denies dysuria. EXAM: CT ABDOMEN AND PELVIS WITHOUT CONTRAST TECHNIQUE: Multidetector CT imaging of the abdomen and pelvis was performed following the standard protocol without IV contrast. RADIATION DOSE REDUCTION: This exam was performed according to the departmental dose-optimization program which includes automated exposure control, adjustment of the mA and/or kV according to patient size and/or use of iterative reconstruction technique. COMPARISON:  CT abdomen pelvis 09/25/2022 FINDINGS: Lower chest: No acute abnormality. Hepatobiliary: No focal liver abnormality. No gallstones, gallbladder wall thickening, or pericholecystic fluid. No biliary dilatation. Pancreas: No focal lesion. Normal pancreatic contour. No surrounding inflammatory changes. No main pancreatic ductal dilatation. Spleen: Normal in size without focal abnormality. Adrenals/Urinary Tract: No adrenal nodule bilaterally. No nephrolithiasis and no hydronephrosis. No definite contour-deforming renal mass. No ureterolithiasis or hydroureter. The urinary bladder is unremarkable. Stomach/Bowel: Stomach is within normal limits. No evidence of bowel wall thickening or dilatation. Fatty infiltration of the wall of the large bowel suggestive of chronic inflammation or constipation. Appendix appears normal. Vascular/Lymphatic: No abdominal aorta or iliac aneurysm. No abdominal, pelvic, or inguinal lymphadenopathy. Reproductive: Uterus and bilateral adnexa are unremarkable. Other: No intraperitoneal free fluid. No intraperitoneal free gas. No organized fluid collection. Musculoskeletal: Tiny fat  containing umbilical hernia. No suspicious lytic or blastic osseous lesions. No acute displaced fracture. IMPRESSION: No acute intra-abdominal or intrapelvic abnormality with limited evaluation on this noncontrast study. Electronically Signed   By: Morgane  Naveau M.D.   On: 05/25/2024 20:17        Scheduled Meds:  enoxaparin (LOVENOX) injection  40 mg Subcutaneous Q24H   metoCLOPramide  (REGLAN ) injection  5 mg Intravenous Q6H   pantoprazole  (PROTONIX ) IV  40 mg Intravenous Daily   Continuous Infusions:  lactated ringers  150 mL/hr at 05/25/24 2223     LOS: 0 days    Time spent: 45 minutes spent on chart review, discussion with nursing staff, consultants, updating family and interview/physical exam; more than 50% of that time was spent in counseling and/or coordination of care.    Harlene RAYMOND Bowl, DO [facility] Available via Epic secure chat 7am-7pm After these hours, please refer to coverage provider listed on [email or web address] 05/26/2024, 8:09 AM

## 2024-05-26 NOTE — Plan of Care (Incomplete)
  Problem: Health Behavior/Discharge Planning: Goal: Ability to manage health-related needs will improve Outcome: Progressing   Problem: Clinical Measurements: Goal: Ability to maintain clinical measurements within normal limits will improve Outcome: Progressing Goal: Diagnostic test results will improve Outcome: Progressing Goal: Cardiovascular complication will be avoided Outcome: Progressing   Problem: Activity: Goal: Risk for activity intolerance will decrease Outcome: Progressing   Problem: Nutrition: Goal: Adequate nutrition will be maintained Outcome: Progressing   Problem: Pain Managment: Goal: General experience of comfort will improve and/or be controlled Outcome: Progressing   Problem: Safety: Goal: Ability to remain free from injury will improve Outcome: Progressing   Problem: Education: Goal: Knowledge of General Education information will improve Description: Including pain rating scale, medication(s)/side effects and non-pharmacologic comfort measures Outcome: Adequate for Discharge   Problem: Clinical Measurements: Goal: Will remain free from infection Outcome: Adequate for Discharge Goal: Respiratory complications will improve Outcome: Adequate for Discharge   Problem: Coping: Goal: Level of anxiety will decrease Outcome: Adequate for Discharge   Problem: Elimination: Goal: Will not experience complications related to bowel motility Outcome: Adequate for Discharge Goal: Will not experience complications related to urinary retention Outcome: Adequate for Discharge   Problem: Skin Integrity: Goal: Risk for impaired skin integrity will decrease Outcome: Adequate for Discharge

## 2024-05-27 DIAGNOSIS — R112 Nausea with vomiting, unspecified: Secondary | ICD-10-CM | POA: Diagnosis not present

## 2024-05-27 LAB — BASIC METABOLIC PANEL WITH GFR
Anion gap: 13 (ref 5–15)
BUN: 10 mg/dL (ref 6–20)
CO2: 22 mmol/L (ref 22–32)
Calcium: 9.3 mg/dL (ref 8.9–10.3)
Chloride: 103 mmol/L (ref 98–111)
Creatinine, Ser: 0.83 mg/dL (ref 0.44–1.00)
GFR, Estimated: >60 mL/min (ref >60)
Glucose, Bld: 95 mg/dL (ref 70–99)
Potassium: 3.6 mmol/L (ref 3.5–5.1)
Sodium: 137 mmol/L (ref 135–145)

## 2024-05-27 LAB — CBC
HCT: 35.1 % — ABNORMAL LOW (ref 36.0–46.0)
Hemoglobin: 11.5 g/dL — ABNORMAL LOW (ref 12.0–15.0)
MCH: 27.3 pg (ref 26.0–34.0)
MCHC: 32.8 g/dL (ref 30.0–36.0)
MCV: 83.2 fL (ref 80.0–100.0)
Platelets: 288 K/uL (ref 150–400)
RBC: 4.22 MIL/uL (ref 3.87–5.11)
RDW: 15.4 % (ref 11.5–15.5)
WBC: 7.3 K/uL (ref 4.0–10.5)
nRBC: 0 % (ref 0.0–0.2)

## 2024-05-27 MED ORDER — PROMETHAZINE HCL 25 MG RE SUPP
25.0000 mg | Freq: Four times a day (QID) | RECTAL | Status: DC | PRN
Start: 2024-05-27 — End: 2024-05-28
  Administered 2024-05-27 – 2024-05-28 (×2): 25 mg via RECTAL
  Filled 2024-05-27 (×5): qty 1

## 2024-05-27 MED ORDER — LACTATED RINGERS IV SOLN: INTRAVENOUS | Status: AC

## 2024-05-27 MED ORDER — CAPSAICIN 0.075 % EX CREA
TOPICAL_CREAM | Freq: Two times a day (BID) | CUTANEOUS | Status: DC
  Filled 2024-05-27: qty 57

## 2024-05-27 NOTE — Hospital Course (Addendum)
 28yo with h/o bipolar d/o, asthma, anxiety/depression, chronic back pain, and THC use who presented on 10/6 with intractable n/v, likely cannabinoid hyperemesis syndrome.  She has tolerated dietary advancement but has recurrent symptoms.  Will resume NPO and give PR Phenergan  for breakthrough n/v.

## 2024-05-27 NOTE — Plan of Care (Signed)

## 2024-05-27 NOTE — Progress Notes (Signed)
 Progress Note   Patient: Melanie Hancock FMW:[id number] DOB: Apr 29, 1996 DOA: 05/25/2024     1 DOS: the patient was seen and examined on 05/27/2024   Brief hospital course: 28yo with h/o bipolar d/o, asthma, anxiety/depression, chronic back pain, and THC use who presented on 10/6 with intractable n/v, likely cannabinoid hyperemesis syndrome.  She has tolerated dietary advancement but has recurrent symptoms.  Will resume NPO and give PR Phenergan  for breakthrough n/v.  Assessment and Plan:  Intractable nausea and vomiting due to Cannabis hyperemesis syndrome Patient with history of cannabis hyperemesis syndrome (and hyperemesis gravidarum) presented with acute onset of nausea, vomiting and abdominal discomfort Patient reports daily use of marijuana CT A/P without contrast does not show any acute intra-abdominal or pelvic pathology She is afebrile and without leukocytosis, nausea and vomiting likely secondary to cannabis hyperemesis syndrome Added reglan  on 10/7 Still vomiting on 10/8 Stop diet, add back IVF Will add PR phenergan  and topical Zostrix Encourage cessation of marijuana   Bradycardia Patient with initial heart rate in the 70s with persistently in the 40s to 50s since admission Patient with no chest pain, shortness of breath, dizziness or syncope On chart review, patient does have a history of intermittent bradycardia often asymptomatic as far back as 2014 Continue telemetry TSH normal   Asthma Chronic and stable   Bipolar disorder/ Anxiety and depression Reports she is not on any medications Follow-up with behavioral health in the outpatient   Chronic back pain Follow-up with orthopedic surgery in the outpatient setting Ambulate   GERD IV Protonix       Consultants: None  Procedures: None  Antibiotics: None  30 Day Unplanned Readmission Risk Score    Flowsheet Row ED to Hosp-Admission (Current) from 05/25/2024 in [facility]TH FLOOR PROGRESSIVE CARE AND  UROLOGY  30 Day Unplanned Readmission Risk Score (%) 12.28 Filed at 05/27/2024 1200    This score is the patient's risk of an unplanned readmission within 30 days of being discharged (0 -100%). The score is based on dignosis, age, lab data, medications, orders, and past utilization.   Low:  0-14.9   Medium: 15-21.9   High: 22-29.9   Extreme: 30 and above           Subjective: reports ongoing nausea, inability to tolerate diet.  Last emesis was reported to be 1 hour before my evaluation.   Objective: Vitals:   05/27/24 0447 05/27/24 1319  BP: (!) 150/82 120/63  Pulse: (!) 52 (!) 47  Resp: 18 16  Temp: 98.5 F (36.9 C) 98 F (36.7 C)  SpO2: 100% 99%    Intake/Output Summary (Last 24 hours) at 05/27/2024 1538 Last data filed at 05/27/2024 0917 Gross per 24 hour  Intake 1629.21 ml  Output --  Net 1629.21 ml   Filed Weights   05/26/24 1638  Weight: 79 kg    Exam:  General:  Appears calm and comfortable and is in NAD Eyes:  normal lids, iris ENT:  grossly normal hearing, lips & tongue, mmm Cardiovascular:  RRR. No LE edema.  Respiratory:   CTA bilaterally with no wheezes/rales/rhonchi.  Normal respiratory effort. Abdomen:  soft, NT, ND Skin:  no rash or induration seen on limited exam Musculoskeletal:  grossly normal tone BUE/BLE, good ROM, no bony abnormality Psychiatric:  blunted mood and affect, speech fluent and appropriate, AOx3 Neurologic:  CN 2-12 grossly intact, moves all extremities in coordinated fashion  Data Reviewed: I have reviewed the patient's lab results since admission.  Pertinent  labs for today include:   Normal BMP WBC 7.3 Hgb 11.5, stable     Family Communication: None present      Code Status: Full Code   Disposition: Status is: Inpatient Remains inpatient appropriate because: ongoing management     Time spent: 50 minutes  Unresulted Labs (From admission, onward)    None        Author: Delon Herald, MD 05/27/2024  3:38 PM  For on call review [email or web address].

## 2024-05-28 DIAGNOSIS — R112 Nausea with vomiting, unspecified: Secondary | ICD-10-CM | POA: Diagnosis not present

## 2024-05-28 MED ORDER — PROMETHAZINE HCL 25 MG RE SUPP: 25.0000 mg | Freq: Four times a day (QID) | RECTAL | 0 refills | Status: AC | PRN

## 2024-05-28 MED ORDER — PANTOPRAZOLE SODIUM 40 MG PO TBEC: 40.0000 mg | DELAYED_RELEASE_TABLET | Freq: Every day | ORAL | 0 refills | Status: AC

## 2024-05-28 MED ORDER — CAPSAICIN 0.075 % EX CREA: TOPICAL_CREAM | Freq: Two times a day (BID) | CUTANEOUS | 0 refills | Status: AC

## 2024-05-28 MED ORDER — ONDANSETRON HCL 4 MG PO TABS: 4.0000 mg | ORAL_TABLET | Freq: Four times a day (QID) | ORAL | 0 refills | Status: AC | PRN

## 2024-05-28 MED ORDER — DIPHENHYDRAMINE HCL 50 MG/ML IJ SOLN
25.0000 mg | Freq: Once | INTRAMUSCULAR | Status: AC
  Administered 2024-05-28: 25 mg via INTRAVENOUS
  Filled 2024-05-28: qty 1

## 2024-05-28 NOTE — Plan of Care (Signed)

## 2024-05-28 NOTE — Discharge Summary (Signed)
 Physician Discharge Summary   Patient: Melanie Hancock MRN: [id number] DOB: 1996-05-01  Admit date:     05/25/2024  Discharge date: 05/28/24  Discharge Physician: Delon Herald   PCP: Department, [facility]   Recommendations at discharge:   STOP using marijuana! Follow up with PCP in 1-2 weeks  Discharge Diagnoses: Principal Problem:   Intractable nausea and vomiting Active Problems:   Anxiety and depression   Cannabis hyperemesis syndrome   Sinus bradycardia   Lumbar radiculopathy   Chronic asthma without complication    Hospital Course: 28yo with h/o bipolar d/o, asthma, anxiety/depression, chronic back pain, and THC use who presented on 10/6 with intractable n/v, likely cannabinoid hyperemesis syndrome.  She has tolerated dietary advancement but has recurrent symptoms.  Will resume NPO and give PR Phenergan  for breakthrough n/v.  Assessment and Plan:  Intractable nausea and vomiting due to Cannabis hyperemesis syndrome Patient with history of cannabis hyperemesis syndrome (and hyperemesis gravidarum) presented with acute onset of nausea, vomiting and abdominal discomfort Patient reports daily use of marijuana CT A/P without contrast does not show any acute intra-abdominal or pelvic pathology She is afebrile and without leukocytosis, nausea and vomiting likely secondary to cannabis hyperemesis syndrome Added reglan  on 10/7 Still vomiting on 10/8 Stopped diet, added back IVF Added PR phenergan  and topical Zostrix Better on 10/9 and wants to go home, has advanced diet to soft without difficulty Encourage cessation of marijuana   Bradycardia Patient with initial heart rate in the 70s with persistently in the 40s to 60s since admission Patient with no chest pain, shortness of breath, dizziness or syncope On chart review, patient does have a history of intermittent bradycardia often asymptomatic as far back as 2014 TSH normal   Asthma Chronic and stable    Bipolar disorder/ Anxiety and depression Reports she is not on any medications Follow-up with behavioral health in the outpatient   Chronic back pain Follow-up with orthopedic surgery in the outpatient setting Ambulate   GERD Resume Protonix          Consultants: None   Procedures: None   Antibiotics: None    Pain control -   Controlled Substance Reporting System database was reviewed. and patient was instructed, not to drive, operate heavy machinery, perform activities at heights, swimming or participation in water activities or provide baby-sitting services while on Pain, Sleep and Anxiety Medications; until their outpatient Physician has advised to do so again. Also recommended to not to take more than prescribed Pain, Sleep and Anxiety Medications.   Disposition: Home Diet recommendation:  Full liquid diet, advance as tolerated to soft/bland and then regular  DISCHARGE MEDICATION: Allergies as of 05/28/2024       Reactions   Iodine    Pt states it burns and itches    Latex Itching   Mushroom Extract Complex (obsolete)    Throat itches         Medication List     TAKE these medications    ACCRUFeR 30 MG Caps Generic drug: Ferric Maltol Take 1 capsule by mouth 2 (two) times daily.   capsicum 0.075 % topical cream Commonly known as: ZOSTRIX Apply topically 2 (two) times daily.   ondansetron  4 MG tablet Commonly known as: ZOFRAN  Take 1 tablet (4 mg total) by mouth every 6 (six) hours as needed for nausea.   pantoprazole  40 MG tablet Commonly known as: Protonix  Take 1 tablet (40 mg total) by mouth daily.   promethazine  25 MG suppository Commonly known  as: PHENERGAN  Place 1 suppository (25 mg total) rectally every 6 (six) hours as needed for refractory nausea / vomiting.   tiZANidine  4 MG tablet Commonly known as: ZANAFLEX  Take 4 mg by mouth every 8 (eight) hours as needed for muscle spasms.        Discharge  Exam:   Subjective: Feeling better today.  Lying on the bench seat in her room, naked.  Reports wanting to advance diet and go home.   Objective: Vitals:   05/28/24 0543 05/28/24 1444  BP: 129/80 (!) 134/91  Pulse: 63 60  Resp: 19 14  Temp: 98.4 F (36.9 C) 98.7 F (37.1 C)  SpO2: 99% 100%    Intake/Output Summary (Last 24 hours) at 05/28/2024 1608 Last data filed at 05/28/2024 0900 Gross per 24 hour  Intake 916.46 ml  Output --  Net 916.46 ml   Filed Weights   05/26/24 1638  Weight: 79 kg    Exam:  General:  Appears calm and comfortable and is in NAD Eyes:  normal lids, iris ENT:  grossly normal hearing, lips & tongue, mmm Cardiovascular:  RRR. No LE edema.  Respiratory:   CTA bilaterally with no wheezes/rales/rhonchi.  Normal respiratory effort. Abdomen:  soft, NT, ND Skin:  no rash or induration seen on limited exam Musculoskeletal:  grossly normal tone BUE/BLE, good ROM, no bony abnormality Psychiatric:  blunted mood and affect, speech fluent and appropriate, AOx3 Neurologic:  CN 2-12 grossly intact, moves all extremities in coordinated fashion  Data Reviewed: I have reviewed the patient's lab results since admission.  Pertinent labs for today include:   Normal BMP WBC 7.3 Hgb 11.5, stable    Condition at discharge: stable  The results of significant diagnostics from this hospitalization (including imaging, microbiology, ancillary and laboratory) are listed below for reference.   Imaging Studies: CT ABDOMEN PELVIS WO CONTRAST Result Date: 05/25/2024 CLINICAL DATA:  Abdominal pain, acute, nonlocalized a 28 y.o. female was evaluated in triage. Pt complains of NV abdominal pain. Worse in LLQ and left flank. Denies dysuria. EXAM: CT ABDOMEN AND PELVIS WITHOUT CONTRAST TECHNIQUE: Multidetector CT imaging of the abdomen and pelvis was performed following the standard protocol without IV contrast. RADIATION DOSE REDUCTION: This exam was performed according to the  departmental dose-optimization program which includes automated exposure control, adjustment of the mA and/or kV according to patient size and/or use of iterative reconstruction technique. COMPARISON:  CT abdomen pelvis 09/25/2022 FINDINGS: Lower chest: No acute abnormality. Hepatobiliary: No focal liver abnormality. No gallstones, gallbladder wall thickening, or pericholecystic fluid. No biliary dilatation. Pancreas: No focal lesion. Normal pancreatic contour. No surrounding inflammatory changes. No main pancreatic ductal dilatation. Spleen: Normal in size without focal abnormality. Adrenals/Urinary Tract: No adrenal nodule bilaterally. No nephrolithiasis and no hydronephrosis. No definite contour-deforming renal mass. No ureterolithiasis or hydroureter. The urinary bladder is unremarkable. Stomach/Bowel: Stomach is within normal limits. No evidence of bowel wall thickening or dilatation. Fatty infiltration of the wall of the large bowel suggestive of chronic inflammation or constipation. Appendix appears normal. Vascular/Lymphatic: No abdominal aorta or iliac aneurysm. No abdominal, pelvic, or inguinal lymphadenopathy. Reproductive: Uterus and bilateral adnexa are unremarkable. Other: No intraperitoneal free fluid. No intraperitoneal free gas. No organized fluid collection. Musculoskeletal: Tiny fat containing umbilical hernia. No suspicious lytic or blastic osseous lesions. No acute displaced fracture. IMPRESSION: No acute intra-abdominal or intrapelvic abnormality with limited evaluation on this noncontrast study. Electronically Signed   By: Morgane  Naveau M.D.   On: 05/25/2024 20:17  Microbiology: Results for orders placed or performed during the hospital encounter of 05/25/24  Resp panel by RT-PCR (RSV, Flu A&B, Covid) Anterior Nasal Swab     Status: None   Collection Time: 05/25/24 12:49 PM   Specimen: Anterior Nasal Swab  Result Value Ref Range Status   SARS Coronavirus 2 by RT PCR NEGATIVE  NEGATIVE Final    Comment: (NOTE) SARS-CoV-2 target nucleic acids are NOT DETECTED.  The SARS-CoV-2 RNA is generally detectable in upper respiratory specimens during the acute phase of infection. The lowest concentration of SARS-CoV-2 viral copies this assay can detect is 138 copies/mL. A negative result does not preclude SARS-Cov-2 infection and should not be used as the sole basis for treatment or other patient management decisions. A negative result may occur with  improper specimen collection/handling, submission of specimen other than nasopharyngeal swab, presence of viral mutation(s) within the areas targeted by this assay, and inadequate number of viral copies(<138 copies/mL). A negative result must be combined with clinical observations, patient history, and epidemiological information. The expected result is Negative.  Fact Sheet for Patients:  BloggerCourse.com  Fact Sheet for Healthcare Providers:  SeriousBroker.it  This test is no t yet approved or cleared by the [place]  FDA and  has been authorized for detection and/or diagnosis of SARS-CoV-2 by FDA under an Emergency Use Authorization (EUA). This EUA will remain  in effect (meaning this test can be used) for the duration of the COVID-19 declaration under Section 564(b)(1) of the Act, 21 U.S.C.section 360bbb-3(b)(1), unless the authorization is terminated  or revoked sooner.       Influenza A by PCR NEGATIVE NEGATIVE Final   Influenza B by PCR NEGATIVE NEGATIVE Final    Comment: (NOTE) The Xpert Xpress SARS-CoV-2/FLU/RSV plus assay is intended as an aid in the diagnosis of influenza from Nasopharyngeal swab specimens and should not be used as a sole basis for treatment. Nasal washings and aspirates are unacceptable for Xpert Xpress SARS-CoV-2/FLU/RSV testing.  Fact Sheet for Patients: BloggerCourse.com  Fact Sheet for Healthcare  Providers: SeriousBroker.it  This test is not yet approved or cleared by the [place]  FDA and has been authorized for detection and/or diagnosis of SARS-CoV-2 by FDA under an Emergency Use Authorization (EUA). This EUA will remain in effect (meaning this test can be used) for the duration of the COVID-19 declaration under Section 564(b)(1) of the Act, 21 U.S.C. section 360bbb-3(b)(1), unless the authorization is terminated or revoked.     Resp Syncytial Virus by PCR NEGATIVE NEGATIVE Final    Comment: (NOTE) Fact Sheet for Patients: BloggerCourse.com  Fact Sheet for Healthcare Providers: SeriousBroker.it  This test is not yet approved or cleared by the [place]  FDA and has been authorized for detection and/or diagnosis of SARS-CoV-2 by FDA under an Emergency Use Authorization (EUA). This EUA will remain in effect (meaning this test can be used) for the duration of the COVID-19 declaration under Section 564(b)(1) of the Act, 21 U.S.C. section 360bbb-3(b)(1), unless the authorization is terminated or revoked.  Performed at [facility], [address], [place], [place] [address]     Labs: CBC: Recent Labs  Lab 05/25/24 1250 05/26/24 0500 05/27/24 0439  WBC 8.0 7.3 7.3  NEUTROABS 6.9  --   --   HGB 12.5 10.6* 11.5*  HCT 38.7 33.9* 35.1*  MCV 84.1 83.7 83.2  PLT 350 292 288   Basic Metabolic Panel: Recent Labs  Lab 05/25/24 1250 05/26/24 0500 05/27/24 0439  NA 137  137 137  K 4.4 3.8 3.6  CL 105 104 103  CO2 22 22 22   GLUCOSE 131* 113* 95  BUN 12 10 10   CREATININE 0.94 0.74 0.83  CALCIUM  9.8 9.3 9.3  MG  --  1.9  --   PHOS  --  3.7  --    Liver Function Tests: Recent Labs  Lab 05/25/24 1250  AST 21  ALT 21  ALKPHOS 64  BILITOT 0.4  PROT 7.8  ALBUMIN 4.5   CBG: No results for input(s): GLUCAP in the last 168 hours.  Discharge time spent:  greater than 30 minutes.  Signed: Delon Herald, MD [facility] 05/28/2024

## 2024-06-27 ENCOUNTER — Ambulatory Visit (HOSPITAL_COMMUNITY): Payer: Self-pay

## 2024-06-28 ENCOUNTER — Other Ambulatory Visit: Payer: Self-pay

## 2024-06-28 ENCOUNTER — Encounter (HOSPITAL_BASED_OUTPATIENT_CLINIC_OR_DEPARTMENT_OTHER): Payer: Self-pay | Admitting: Emergency Medicine

## 2024-06-28 ENCOUNTER — Emergency Department (HOSPITAL_BASED_OUTPATIENT_CLINIC_OR_DEPARTMENT_OTHER)
Admission: EM | Admit: 2024-06-28 | Discharge: 2024-06-28 | Disposition: A | Discharge status: Home / Self Care | Attending: Emergency Medicine | Admitting: Emergency Medicine

## 2024-06-28 ENCOUNTER — Emergency Department (HOSPITAL_BASED_OUTPATIENT_CLINIC_OR_DEPARTMENT_OTHER)

## 2024-06-28 ENCOUNTER — Encounter (HOSPITAL_COMMUNITY): Payer: Self-pay

## 2024-06-28 ENCOUNTER — Ambulatory Visit (HOSPITAL_COMMUNITY)
Admission: RE | Admit: 2024-06-28 | Discharge: 2024-06-28 | Disposition: A | Discharge status: Home / Self Care | Source: Ambulatory Visit | Attending: Internal Medicine | Admitting: Internal Medicine

## 2024-06-28 VITALS — BP 118/69 | HR 105 | Temp 98.8°F | Resp 17 | Ht 65.5 in | Wt 175.0 lb

## 2024-06-28 DIAGNOSIS — N912 Amenorrhea, unspecified: Secondary | ICD-10-CM

## 2024-06-28 DIAGNOSIS — M79641 Pain in right hand: Secondary | ICD-10-CM | POA: Diagnosis present

## 2024-06-28 DIAGNOSIS — H579 Unspecified disorder of eye and adnexa: Secondary | ICD-10-CM | POA: Diagnosis not present

## 2024-06-28 DIAGNOSIS — S60221A Contusion of right hand, initial encounter: Secondary | ICD-10-CM | POA: Insufficient documentation

## 2024-06-28 DIAGNOSIS — H1131 Conjunctival hemorrhage, right eye: Secondary | ICD-10-CM | POA: Insufficient documentation

## 2024-06-28 DIAGNOSIS — H11421 Conjunctival edema, right eye: Secondary | ICD-10-CM | POA: Diagnosis not present

## 2024-06-28 DIAGNOSIS — Z91040 Latex allergy status: Secondary | ICD-10-CM | POA: Insufficient documentation

## 2024-06-28 DIAGNOSIS — H5711 Ocular pain, right eye: Secondary | ICD-10-CM

## 2024-06-28 LAB — POCT URINE PREGNANCY: Preg Test, Ur: NEGATIVE

## 2024-06-28 MED ORDER — ERYTHROMYCIN 5 MG/GM OP OINT: TOPICAL_OINTMENT | OPHTHALMIC | 0 refills | Status: AC

## 2024-06-28 MED ORDER — ERYTHROMYCIN 5 MG/GM OP OINT
TOPICAL_OINTMENT | Freq: Once | OPHTHALMIC | Status: AC
  Administered 2024-06-28: 1 via OPHTHALMIC
  Filled 2024-06-28: qty 3.5

## 2024-06-28 MED ORDER — TETRACAINE HCL 0.5 % OP SOLN
2.0000 [drp] | Freq: Once | OPHTHALMIC | Status: AC
  Administered 2024-06-28: 2 [drp] via OPHTHALMIC
  Filled 2024-06-28: qty 4

## 2024-06-28 MED ORDER — FLUORESCEIN SODIUM 1 MG OP STRP
1.0000 | ORAL_STRIP | Freq: Once | OPHTHALMIC | Status: AC
  Administered 2024-06-28: 1 via OPHTHALMIC
  Filled 2024-06-28: qty 1

## 2024-06-28 NOTE — ED Triage Notes (Signed)
 Pt states that she has a injury to right eye. Pt states that she has some right sided facial swelling. X1 day  Pt states that she fell and injured her right hand. X1 day  Pt states that her menstrual cycle is also late.
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
## 2024-06-28 NOTE — Discharge Instructions (Addendum)
 The right eye has a very concerning presentation with the pain with EOM and the worsening course after the trauma last night. Due to this, we feel this needs more advanced workup in the emergency room where advanced imaging can be obtained.   In regards to the hand injury and the cough, this can be evaluated at the ER as well as we feel the eye is of upmost importance at this time.   Urine pregnancy test done today due to amenorrhea. This is negative.
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
## 2024-06-28 NOTE — ED Triage Notes (Signed)
 Pt states that she also has a cough. X3 weeks
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
## 2024-06-28 NOTE — Discharge Instructions (Addendum)
 Follow up at 8:00 am tomorrow morning with Dr. Octavia Pass a health care provider if: You have pain in your eye. The bleeding doesn't go away within 4 weeks. You keep getting new subconjunctival hemorrhages. Get help right away if: Your vision changes or you have double vision. You're suddenly sensitive to light. You have a very bad headache or keep throwing up. You're confused or more tired than normal. Your eye seems swollen or sticks out. You have unexplained bruises or bleeding in another area of your body. These symptoms may be an emergency. Call 911 right away. Do not wait to see if the symptoms will go away. Do not drive yourself to the hospital.

## 2024-07-02 ENCOUNTER — Telehealth (HOSPITAL_COMMUNITY): Payer: Self-pay

## 2024-07-02 NOTE — Telephone Encounter (Signed)
 Pt called about cyto results from 06/28/24 UC visit. No results available. Reviewed with E. White, PA-C, who advised pt may return for Nurse Visit to recollect due to oversight at visit. Pt notified. Verbalized understanding.

## 2024-07-27 ENCOUNTER — Ambulatory Visit (HOSPITAL_COMMUNITY)
Admission: RE | Admit: 2024-07-27 | Discharge: 2024-07-27 | Disposition: A | Source: Ambulatory Visit | Attending: Surgery | Admitting: Surgery

## 2024-07-27 ENCOUNTER — Other Ambulatory Visit (HOSPITAL_COMMUNITY): Payer: Self-pay | Admitting: Medical

## 2024-07-27 DIAGNOSIS — M79661 Pain in right lower leg: Secondary | ICD-10-CM

## 2024-09-23 ENCOUNTER — Emergency Department (HOSPITAL_BASED_OUTPATIENT_CLINIC_OR_DEPARTMENT_OTHER)
Admission: EM | Admit: 2024-09-23 | Discharge: 2024-09-23 | Disposition: A | Discharge status: Home / Self Care | Attending: Emergency Medicine | Admitting: Emergency Medicine

## 2024-09-23 ENCOUNTER — Other Ambulatory Visit: Payer: Self-pay

## 2024-09-23 ENCOUNTER — Other Ambulatory Visit (HOSPITAL_BASED_OUTPATIENT_CLINIC_OR_DEPARTMENT_OTHER): Payer: Self-pay

## 2024-09-23 ENCOUNTER — Encounter (HOSPITAL_BASED_OUTPATIENT_CLINIC_OR_DEPARTMENT_OTHER): Payer: Self-pay | Admitting: *Deleted

## 2024-09-23 DIAGNOSIS — Z91040 Latex allergy status: Secondary | ICD-10-CM | POA: Insufficient documentation

## 2024-09-23 DIAGNOSIS — R112 Nausea with vomiting, unspecified: Secondary | ICD-10-CM | POA: Insufficient documentation

## 2024-09-23 LAB — CBC
HCT: 41.4 % (ref 36.0–46.0)
Hemoglobin: 14.2 g/dL (ref 12.0–15.0)
MCH: 29.2 pg (ref 26.0–34.0)
MCHC: 34.3 g/dL (ref 30.0–36.0)
MCV: 85 fL (ref 80.0–100.0)
Platelets: 350 10*3/uL (ref 150–400)
RBC: 4.87 MIL/uL (ref 3.87–5.11)
RDW: 13.4 % (ref 11.5–15.5)
WBC: 7.5 10*3/uL (ref 4.0–10.5)
nRBC: 0 % (ref 0.0–0.2)

## 2024-09-23 LAB — URINALYSIS, ROUTINE W REFLEX MICROSCOPIC
Bacteria, UA: NONE SEEN
Bilirubin Urine: NEGATIVE
Glucose, UA: NEGATIVE mg/dL
Ketones, ur: 40 mg/dL — AB
Nitrite: NEGATIVE
Protein, ur: 100 mg/dL — AB
RBC / HPF: >50 RBC/hpf (ref 0–5)
Specific Gravity, Urine: 1.036 — ABNORMAL HIGH (ref 1.005–1.030)
pH: 8 (ref 5.0–8.0)

## 2024-09-23 LAB — COMPREHENSIVE METABOLIC PANEL WITH GFR
ALT: 19 U/L (ref 0–44)
AST: 20 U/L (ref 15–41)
Albumin: 5.1 g/dL — ABNORMAL HIGH (ref 3.5–5.0)
Alkaline Phosphatase: 77 U/L (ref 38–126)
Anion gap: 17 — ABNORMAL HIGH (ref 5–15)
BUN: 14 mg/dL (ref 6–20)
CO2: 23 mmol/L (ref 22–32)
Calcium: 10.8 mg/dL — ABNORMAL HIGH (ref 8.9–10.3)
Chloride: 100 mmol/L (ref 98–111)
Creatinine, Ser: 0.89 mg/dL (ref 0.44–1.00)
GFR, Estimated: >60 mL/min
Glucose, Bld: 96 mg/dL (ref 70–99)
Potassium: 3.6 mmol/L (ref 3.5–5.1)
Sodium: 139 mmol/L (ref 135–145)
Total Bilirubin: 0.9 mg/dL (ref 0.0–1.2)
Total Protein: 9 g/dL — ABNORMAL HIGH (ref 6.5–8.1)

## 2024-09-23 LAB — URINE DRUG SCREEN
Amphetamines: NEGATIVE
Barbiturates: NEGATIVE
Benzodiazepines: NEGATIVE
Cocaine: NEGATIVE
Fentanyl: NEGATIVE
Methadone Scn, Ur: NEGATIVE
Opiates: NEGATIVE
Tetrahydrocannabinol: POSITIVE — AB

## 2024-09-23 LAB — LIPASE, BLOOD: Lipase: 46 U/L (ref 11–51)

## 2024-09-23 LAB — PREGNANCY, URINE: Preg Test, Ur: NEGATIVE

## 2024-09-23 MED ORDER — LIDOCAINE VISCOUS HCL 2 % MT SOLN
15.0000 mL | Freq: Once | OROMUCOSAL | Status: AC
  Administered 2024-09-23: 15 mL via ORAL
  Filled 2024-09-23: qty 15

## 2024-09-23 MED ORDER — DROPERIDOL 2.5 MG/ML IJ SOLN
2.5000 mg | Freq: Once | INTRAMUSCULAR | Status: AC
  Administered 2024-09-23: 2.5 mg via INTRAVENOUS
  Filled 2024-09-23: qty 2

## 2024-09-23 MED ORDER — ALUM & MAG HYDROXIDE-SIMETH 200-200-20 MG/5ML PO SUSP
30.0000 mL | Freq: Once | ORAL | Status: AC
  Administered 2024-09-23: 30 mL via ORAL
  Filled 2024-09-23: qty 30

## 2024-09-23 MED ORDER — SODIUM CHLORIDE 0.9 % IV BOLUS
1000.0000 mL | Freq: Once | INTRAVENOUS | Status: AC
  Administered 2024-09-23: 1000 mL via INTRAVENOUS

## 2024-09-23 MED ORDER — PROCHLORPERAZINE MALEATE 10 MG PO TABS
10.0000 mg | ORAL_TABLET | Freq: Two times a day (BID) | ORAL | 0 refills | Status: AC | PRN
  Filled 2024-09-23: qty 10, 5d supply, fill #0

## 2024-09-23 NOTE — ED Triage Notes (Signed)
 Pt arrives by [facility] from home. Pt has had nausea and vomiting since Monday.  Pt had diarrhea Monday but this has since resolved.  Pt feels like she can not hold anything down. Pt has had cannaboid hyperemesis in the past but thinks this is related to food she ate Monday.

## 2024-09-23 NOTE — Discharge Instructions (Addendum)
 Your symptoms are consistent with either foodborne illness or cannabis hyperemesis syndrome which is a toxic buildup of THC in your system causing uncontrolled nausea and vomiting.  Compazine  has been prescribed for nausea.  Continue to orally rehydrate at home with electrolyte containing fluids like Pedialyte or sugar-free Gatorade.  Return to the emergency department for severe worsening abdominal pain, inability to tolerate oral intake with concern for developing dehydration.

## 2024-09-23 NOTE — ED Provider Notes (Signed)
 " [place] [facility] AT [facility] Provider Note   CSN: [id number] Arrival date & time: 09/23/24  1052     Patient presents with: Emesis   Melanie Hancock is a 29 y.o. female.    Emesis    29 year old female with medical history significant for anxiety, depression, iron  deficiency anemia, herpes simplex, bipolar disorder, GERD, UTI/pyelonephritis, chlamydia, cannabis hyperemesis syndrome presenting to the emergency department with uncontrolled nausea and vomiting.  The patient states that she had been using THC/cannabis products prior to onset of her symptoms however she thinks her symptoms were triggered this time around by food intake.  She had suspect food intake and then developed nausea and vomiting shortly thereafter and then had an episode of loose stools that same day.  Since then she has had persistent NBNB emesis and retching and has had difficulty keeping anything down.  No genitourinary symptoms, no vaginal bleeding or discharge.   Prior to Admission medications  Medication Sig Start Date End Date Taking? Authorizing Provider  prochlorperazine  (COMPAZINE ) 10 MG tablet Take 1 tablet (10 mg total) by mouth 2 (two) times daily as needed for nausea or vomiting. 09/23/24  Yes Jerrol Agent, MD  ACCRUFER 30 MG CAPS Take 1 capsule by mouth 2 (two) times daily. 02/26/24   [provider]  capsicum (ZOSTRIX) 0.075 % topical cream Apply topically 2 (two) times daily. 05/28/24   Barbarann Nest, MD  erythromycin  ophthalmic ointment Place a 1/2 inch strip in the lower lid every 4 hours while awake 06/28/24   Harris, Abigail, PA-C  ondansetron  (ZOFRAN ) 4 MG tablet Take 1 tablet (4 mg total) by mouth every 6 (six) hours as needed for nausea. 05/28/24   Barbarann Nest, MD  pantoprazole  (PROTONIX ) 40 MG tablet Take 1 tablet (40 mg total) by mouth daily. 05/28/24 05/28/25  Barbarann Nest, MD  promethazine  (PHENERGAN ) 25 MG suppository Place 1 suppository (25 mg total)  rectally every 6 (six) hours as needed for refractory nausea / vomiting. 05/28/24   Barbarann Nest, MD  tiZANidine  (ZANAFLEX ) 4 MG tablet Take 4 mg by mouth every 8 (eight) hours as needed for muscle spasms.    [provider]    Allergies: Iodine, Latex, and Mushroom extract complex (obsolete)    Review of Systems  Gastrointestinal:  Positive for vomiting.  All other systems reviewed and are negative.   Updated Vital Signs BP 120/80   Pulse 61   Temp 98.2 F (36.8 C)   Resp (!) 21   LMP 09/16/2024   SpO2 100%   Physical Exam Vitals and nursing note reviewed.  Constitutional:      General: She is not in acute distress.    Appearance: She is well-developed.     Comments: Actively retching  HENT:     Head: Normocephalic and atraumatic.  Eyes:     Conjunctiva/sclera: Conjunctivae normal.  Cardiovascular:     Rate and Rhythm: Normal rate and regular rhythm.     Heart sounds: No murmur heard. Pulmonary:     Effort: Pulmonary effort is normal. No respiratory distress.     Breath sounds: Normal breath sounds.  Abdominal:     Palpations: Abdomen is soft.     Tenderness: There is no abdominal tenderness.     Comments: Abdomen is soft, nontender, nondistended, no rebound or guarding all 4 quadrants  Musculoskeletal:        General: No swelling.     Cervical back: Neck supple.  Skin:  General: Skin is warm and dry.     Capillary Refill: Capillary refill takes less than 2 seconds.  Neurological:     Mental Status: She is alert.  Psychiatric:        Mood and Affect: Mood normal.     (all labs ordered are listed, but only abnormal results are displayed) Labs Reviewed  COMPREHENSIVE METABOLIC PANEL WITH GFR - Abnormal; Notable for the following components:      Result Value   Calcium  10.8 (*)    Total Protein 9.0 (*)    Albumin 5.1 (*)    Anion gap 17 (*)    All other components within normal limits  URINALYSIS, ROUTINE W REFLEX MICROSCOPIC - Abnormal;  Notable for the following components:   Color, Urine ORANGE (*)    APPearance CLOUDY (*)    Specific Gravity, Urine 1.036 (*)    Hgb urine dipstick LARGE (*)    Ketones, ur 40 (*)    Protein, ur 100 (*)    Leukocytes,Ua TRACE (*)    All other components within normal limits  URINE DRUG SCREEN - Abnormal; Notable for the following components:   Tetrahydrocannabinol POSITIVE (*)    All other components within normal limits  LIPASE, BLOOD  CBC  PREGNANCY, URINE    EKG: EKG Interpretation Date/Time:  Wednesday September 23 2024 11:36:51 EST Ventricular Rate:  63 PR Interval:  152 QRS Duration:  83 QT Interval:  435 QTC Calculation: 446 R Axis:   87  Text Interpretation: Sinus rhythm No significant change since last tracing Confirmed by Jerrol Agent ([id number]) on 09/23/2024 11:40:16 AM  Radiology: No results found.   Procedures   Medications Ordered in the ED  alum & mag hydroxide-simeth (MAALOX/MYLANTA) 200-200-20 MG/5ML suspension 30 mL (has no administration in time range)    And  lidocaine  (XYLOCAINE ) 2 % viscous mouth solution 15 mL (has no administration in time range)  droperidol  (INAPSINE ) 2.5 MG/ML injection 2.5 mg (2.5 mg Intravenous Given 09/23/24 1132)  sodium chloride  0.9 % bolus 1,000 mL (0 mLs Intravenous Stopped 09/23/24 1231)    Clinical Course as of 09/23/24 1454  Wed Sep 23, 2024  1245 Tetrahydrocannabinol(!): POSITIVE [JL]    Clinical Course User Index [JL] Jerrol Agent, MD                                 Medical Decision Making Amount and/or Complexity of Data Reviewed Labs: ordered. Decision-making details documented in ED Course.  Risk OTC drugs. Prescription drug management.    29 year old female with medical history significant for anxiety, depression, iron  deficiency anemia, herpes simplex, bipolar disorder, GERD, UTI/pyelonephritis, chlamydia, cannabis hyperemesis syndrome presenting to the emergency department with uncontrolled nausea and  vomiting.  The patient states that she had been using THC/cannabis products prior to onset of her symptoms however she thinks her symptoms were triggered this time around by food intake.  She had suspect food intake and then developed nausea and vomiting shortly thereafter and then had an episode of loose stools that same day.  Since then she has had persistent NBNB emesis and retching and has had difficulty keeping anything down.  No genitourinary symptoms, no vaginal bleeding or discharge.   On arrival, the patient was vitally stable, afebrile, not tachycardic or tachypneic, hemodynamically stable, saturating well on room air.  On exam the patient was actively retching but after laying her back in the stretcher, abdomen was  soft, nontender, nondistended with no rebound or guarding in all 4 quadrants.  Patient presenting with uncontrolled nausea and vomiting in the setting of recent cannabis use, also endorses suspect food intake.  Symptoms could be due to cannabis hyperemesis syndrome versus foodborne illness versus viral etiology.  IV access was obtained and the patient was administered an IV fluid bolus.  An EKG revealed normal QTc and the patient was administered IV droperidol .  The nontender abdomen, low concern for intra-abdominal infectious etiology or other emergent pathology requiring CT imaging for further diagnostic evaluation.  Will obtain screening labs.  EKG: Sinus rhythm, ventricular rate 63, QTc 446, no acute ischemic changes or abnormal intervals.  Labs: UDS positive for THC, urine pregnancy negative, urinalysis with hematuria, negative for UTI, lipase normal, CMP generally unremarkable, elevated anion gap to 17, bicarbonate 23, CBC without a leukocytosis or anemia.  Patient was reassessed bedside, notably tolerating p.o. intake, feeling symptomatically improved, administered GI cocktail.  Suspect likely foodborne illness versus cannabis hyperemesis syndrome.  Advised continued outpatient  rehydration, prescription for Compazine  provided, return precautions provided.  Patient overall stable for discharge.     Final diagnoses:  Nausea and vomiting, unspecified vomiting type    ED Discharge Orders          Ordered    prochlorperazine  (COMPAZINE ) 10 MG tablet  2 times daily PRN        09/23/24 1453               Jerrol Agent, MD 09/23/24 1454  "

## 2024-09-23 NOTE — ED Notes (Signed)
 DC paperwork given and verbally understood.

## 2024-09-24 ENCOUNTER — Other Ambulatory Visit (HOSPITAL_BASED_OUTPATIENT_CLINIC_OR_DEPARTMENT_OTHER): Payer: Self-pay
